# Patient Record
Sex: Male | Born: 1945 | Race: White | Hispanic: No | Marital: Single | Smoking: Former smoker
Health system: Southern US, Community
[De-identification: ages and names within clinical notes are randomized; demographics above are authoritative.]

## PROBLEM LIST (undated history)

## (undated) DIAGNOSIS — N529 Male erectile dysfunction, unspecified: Secondary | ICD-10-CM

## (undated) DIAGNOSIS — G4730 Sleep apnea, unspecified: Secondary | ICD-10-CM

## (undated) DIAGNOSIS — E785 Hyperlipidemia, unspecified: Secondary | ICD-10-CM

## (undated) DIAGNOSIS — R972 Elevated prostate specific antigen [PSA]: Secondary | ICD-10-CM

## (undated) DIAGNOSIS — I509 Heart failure, unspecified: Secondary | ICD-10-CM

## (undated) DIAGNOSIS — J189 Pneumonia, unspecified organism: Secondary | ICD-10-CM

## (undated) DIAGNOSIS — R7303 Prediabetes: Secondary | ICD-10-CM

## (undated) DIAGNOSIS — M1990 Unspecified osteoarthritis, unspecified site: Secondary | ICD-10-CM

## (undated) DIAGNOSIS — N139 Obstructive and reflux uropathy, unspecified: Secondary | ICD-10-CM

## (undated) DIAGNOSIS — F32A Depression, unspecified: Secondary | ICD-10-CM

## (undated) DIAGNOSIS — Z85850 Personal history of malignant neoplasm of thyroid: Secondary | ICD-10-CM

## (undated) DIAGNOSIS — C801 Malignant (primary) neoplasm, unspecified: Secondary | ICD-10-CM

## (undated) DIAGNOSIS — E079 Disorder of thyroid, unspecified: Secondary | ICD-10-CM

## (undated) DIAGNOSIS — R0602 Shortness of breath: Secondary | ICD-10-CM

## (undated) DIAGNOSIS — R0600 Dyspnea, unspecified: Secondary | ICD-10-CM

## (undated) DIAGNOSIS — K219 Gastro-esophageal reflux disease without esophagitis: Secondary | ICD-10-CM

## (undated) DIAGNOSIS — K635 Polyp of colon: Secondary | ICD-10-CM

## (undated) DIAGNOSIS — F419 Anxiety disorder, unspecified: Secondary | ICD-10-CM

## (undated) DIAGNOSIS — R1310 Dysphagia, unspecified: Secondary | ICD-10-CM

## (undated) DIAGNOSIS — K649 Unspecified hemorrhoids: Secondary | ICD-10-CM

## (undated) DIAGNOSIS — E7800 Pure hypercholesterolemia, unspecified: Secondary | ICD-10-CM

## (undated) DIAGNOSIS — E039 Hypothyroidism, unspecified: Secondary | ICD-10-CM

## (undated) DIAGNOSIS — E119 Type 2 diabetes mellitus without complications: Secondary | ICD-10-CM

## (undated) DIAGNOSIS — R011 Cardiac murmur, unspecified: Secondary | ICD-10-CM

## (undated) DIAGNOSIS — I10 Essential (primary) hypertension: Secondary | ICD-10-CM

## (undated) DIAGNOSIS — C61 Malignant neoplasm of prostate: Secondary | ICD-10-CM

## (undated) DIAGNOSIS — F329 Major depressive disorder, single episode, unspecified: Secondary | ICD-10-CM

## (undated) DIAGNOSIS — J449 Chronic obstructive pulmonary disease, unspecified: Secondary | ICD-10-CM

## (undated) DIAGNOSIS — K922 Gastrointestinal hemorrhage, unspecified: Secondary | ICD-10-CM

## (undated) DIAGNOSIS — C73 Malignant neoplasm of thyroid gland: Secondary | ICD-10-CM

## (undated) HISTORY — DX: Prediabetes: R73.03

## (undated) HISTORY — PX: TONSILLECTOMY: SUR1361

## (undated) HISTORY — DX: Shortness of breath: R06.02

## (undated) HISTORY — PX: POSTERIOR LAMINECTOMY / DECOMPRESSION CERVICAL SPINE: SUR739

## (undated) HISTORY — PX: EYE SURGERY: SHX253

## (undated) HISTORY — DX: Elevated prostate specific antigen (PSA): R97.20

## (undated) HISTORY — DX: Pure hypercholesterolemia, unspecified: E78.00

## (undated) HISTORY — PX: TOTAL THYROIDECTOMY: SHX2547

## (undated) HISTORY — DX: Depression, unspecified: F32.A

## (undated) HISTORY — DX: Heart failure, unspecified: I50.9

## (undated) HISTORY — DX: Male erectile dysfunction, unspecified: N52.9

## (undated) HISTORY — PX: COLONOSCOPY: SHX174

## (undated) HISTORY — PX: CERVICAL DISC SURGERY: SHX588

## (undated) HISTORY — PX: PROSTATE BIOPSY: SHX241

## (undated) HISTORY — DX: Malignant neoplasm of thyroid gland: C73

## (undated) HISTORY — DX: Personal history of malignant neoplasm of thyroid: Z85.850

## (undated) HISTORY — PX: OTHER SURGICAL HISTORY: SHX169

## (undated) HISTORY — DX: Essential (primary) hypertension: I10

## (undated) HISTORY — PX: ROTATOR CUFF REPAIR: SHX139

## (undated) HISTORY — DX: Malignant neoplasm of prostate: C61

## (undated) HISTORY — DX: Anxiety disorder, unspecified: F41.9

## (undated) HISTORY — DX: Obstructive and reflux uropathy, unspecified: N13.9

## (undated) HISTORY — DX: Polyp of colon: K63.5

## (undated) HISTORY — PX: LAMINECTOMY FOR EXCISION / EVACUATION INTRASPINAL LESION: SUR741

## (undated) HISTORY — PX: HERNIA REPAIR: SHX51

## (undated) HISTORY — DX: Major depressive disorder, single episode, unspecified: F32.9

## (undated) HISTORY — DX: Disorder of thyroid, unspecified: E07.9

## (undated) HISTORY — PX: BACK SURGERY: SHX140

---

## 2002-07-28 ENCOUNTER — Encounter: Payer: Self-pay | Admitting: Neurosurgery

## 2002-07-29 ENCOUNTER — Encounter: Payer: Self-pay | Admitting: Neurosurgery

## 2002-07-29 ENCOUNTER — Observation Stay (HOSPITAL_COMMUNITY): Admission: RE | Admit: 2002-07-29 | Discharge: 2002-07-29 | Payer: Self-pay | Admitting: Neurosurgery

## 2006-04-03 ENCOUNTER — Ambulatory Visit: Payer: Self-pay | Admitting: Internal Medicine

## 2006-06-05 ENCOUNTER — Ambulatory Visit: Payer: Self-pay | Admitting: Unknown Physician Specialty

## 2007-12-04 ENCOUNTER — Ambulatory Visit: Payer: Self-pay | Admitting: Internal Medicine

## 2009-11-19 ENCOUNTER — Ambulatory Visit: Payer: Self-pay | Admitting: Cardiology

## 2009-12-26 ENCOUNTER — Ambulatory Visit: Payer: Self-pay

## 2010-05-02 ENCOUNTER — Ambulatory Visit: Payer: Self-pay | Admitting: Unknown Physician Specialty

## 2011-02-05 ENCOUNTER — Ambulatory Visit: Payer: Self-pay | Admitting: Otolaryngology

## 2011-10-06 ENCOUNTER — Ambulatory Visit: Payer: Self-pay | Admitting: Unknown Physician Specialty

## 2011-11-03 ENCOUNTER — Ambulatory Visit: Payer: Self-pay | Admitting: Urology

## 2011-12-04 ENCOUNTER — Other Ambulatory Visit: Payer: Self-pay | Admitting: Urology

## 2011-12-05 ENCOUNTER — Encounter (HOSPITAL_COMMUNITY): Payer: Self-pay | Admitting: Pharmacy Technician

## 2011-12-11 ENCOUNTER — Encounter (HOSPITAL_COMMUNITY): Payer: Self-pay

## 2011-12-11 ENCOUNTER — Encounter (HOSPITAL_COMMUNITY)
Admission: RE | Admit: 2011-12-11 | Discharge: 2011-12-11 | Disposition: A | Discharge status: Home / Self Care | Payer: Medicare Other | Source: Ambulatory Visit | Attending: Urology | Admitting: Urology

## 2011-12-11 ENCOUNTER — Ambulatory Visit (HOSPITAL_COMMUNITY)
Admission: RE | Admit: 2011-12-11 | Discharge: 2011-12-11 | Disposition: A | Discharge status: Home / Self Care | Payer: Medicare Other | Source: Ambulatory Visit | Attending: Urology | Admitting: Urology

## 2011-12-11 ENCOUNTER — Other Ambulatory Visit: Payer: Self-pay

## 2011-12-11 DIAGNOSIS — I498 Other specified cardiac arrhythmias: Secondary | ICD-10-CM | POA: Insufficient documentation

## 2011-12-11 DIAGNOSIS — Z01812 Encounter for preprocedural laboratory examination: Secondary | ICD-10-CM | POA: Insufficient documentation

## 2011-12-11 DIAGNOSIS — I452 Bifascicular block: Secondary | ICD-10-CM | POA: Insufficient documentation

## 2011-12-11 DIAGNOSIS — Z01818 Encounter for other preprocedural examination: Secondary | ICD-10-CM | POA: Insufficient documentation

## 2011-12-11 DIAGNOSIS — Z01810 Encounter for preprocedural cardiovascular examination: Secondary | ICD-10-CM | POA: Insufficient documentation

## 2011-12-11 HISTORY — DX: Cardiac murmur, unspecified: R01.1

## 2011-12-11 HISTORY — DX: Malignant (primary) neoplasm, unspecified: C80.1

## 2011-12-11 HISTORY — DX: Hypothyroidism, unspecified: E03.9

## 2011-12-11 HISTORY — DX: Essential (primary) hypertension: I10

## 2011-12-11 HISTORY — DX: Gastrointestinal hemorrhage, unspecified: K92.2

## 2011-12-11 HISTORY — DX: Sleep apnea, unspecified: G47.30

## 2011-12-11 LAB — CBC
HCT: 41.7 % (ref 39.0–52.0)
MCH: 29.6 pg (ref 26.0–34.0)
MCHC: 34.1 g/dL (ref 30.0–36.0)
MCV: 87.1 fL (ref 78.0–100.0)
Platelets: 229 10*3/uL (ref 150–400)
RDW: 12.8 % (ref 11.5–15.5)
WBC: 9.8 10*3/uL (ref 4.0–10.5)

## 2011-12-11 LAB — ABO/RH: ABO/RH(D): A POS

## 2011-12-11 LAB — BASIC METABOLIC PANEL
BUN: 12 mg/dL (ref 6–23)
Calcium: 9.2 mg/dL (ref 8.4–10.5)
Chloride: 103 mEq/L (ref 96–112)
Creatinine, Ser: 0.94 mg/dL (ref 0.50–1.35)
GFR calc Af Amer: >90 mL/min (ref >90)

## 2011-12-11 LAB — SURGICAL PCR SCREEN
MRSA, PCR: NEGATIVE
Staphylococcus aureus: NEGATIVE

## 2011-12-11 MED ORDER — CEFAZOLIN SODIUM 1-5 GM-% IV SOLN: 1.0000 g | INTRAVENOUS | Status: DC

## 2011-12-11 NOTE — Patient Instructions (Signed)
20 [name] [name]  12/11/2011   Your procedure is scheduled on:  12/15/11 1100am-200pm  Report to [facility] at 0900 AM.  Call this number if you have problems the morning of surgery: [phone]   Remember:   Do not eat food:After Midnight.  May have clear liquids:until Midnight .  Clear liquids include soda, tea, black coffee, apple or grape juice, broth.  Take these medicines the morning of surgery with A SIP OF WATER:    Do not wear jewelry,   Do not wear lotions, powders, or perfumes.    Do not bring valuables to the hospital.  Contacts, dentures or bridgework may not be worn into surgery.  Leave suitcase in the car. After surgery it may be brought to your room.  For patients admitted to the hospital, checkout time is 11:00 AM the day of discharge.     Special Instructions: CHG Shower Use Special Wash: 1/2 bottle night before surgery and 1/2 bottle morning of surgery. shower chin to toes with CHG.  Wash face and private parts with regular soap.     Please read over the following fact sheets that you were given: MRSA Information, Incentive Spirometry Fact Sheet , Blood Transfusion Fact Sheet, coughing and deep breathing exercises, leg exercises.

## 2011-12-13 NOTE — H&P (Signed)
Chief Complaint  Prostate Cancer   Reason For Visit  Reason for consult: To consider a robotic prostatectomy for treatment of prostate cancer. Physician requesting consult: Dr. Assunta Gambles PCP: Dr. Aram Beecham   History of Present Illness  [name] [name] is a 66 year old who was found to have an elevated PSA of 4.5 which prompted an evaluation and prostate biopsy by Dr. Achilles Dunk.  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 He underwent an MRI by Dr. Achilles Dunk which confirmed suspicion of bilateral malignancy but did not demonstrate evidence for EPE, SVI, or LAD. He has no family history of prostate cancer.  TNM stage: cT1c N0 Mx PSA: 4.5  Gleason score: 3+4=7 Biopsy (10/22/11 - [facility] read by Dr. Audry Riles, Acc # [phone]): 5/12 cores    Left: L lateral mid (25%, 3+4=7), L mid (58%, 3+4=7), L base (34%, 3+4=7)   Right: R lateral apex (3%, 3+4=7), R lateral mid (12%, 3+4=7) Prostate volume: 55.8 cc  Nomogram: OC disease: 80% EPE: 14% SVI: 1% LNI: 2.2% PFS (surgery): 94%, 91%  Urinary function: He has moderate symptoms including urinary frequency, intermittency, and a weak stream. IPSS: 16/5.  Erectile function: He is not sexually active. Erectile function is a low priority to him. SHIM score: 6.   Past Medical History Problems  1. History of  Adult Sleep Apnea 780.57 2. History of  Functional Murmur 785.2 3. History of  Hypercholesterolemia 272.0 4. History of  Hypertension 401.9  Surgical History Problems  1. History of  Back Surgery 2. History of  Neck Surgery 3. History of  Shoulder Arthroscopy With Rotator Cuff Repair Right 4. History of  Thyroid Surgery Total Thyroidectomy 5. History of  Umbilical Hernia Repair  Current Meds 1. Aspirin 81 MG Oral Tablet; Therapy: (Recorded:15Jan2013) to 2. FLUoxetine HCl 20 MG Oral Capsule; Therapy: (Recorded:15Jan2013) to 3. Levothyroxine Sodium 200 MCG  Oral Tablet; Therapy: (Recorded:15Jan2013) to 4. Losartan Potassium-HCTZ 50-12.5 MG Oral Tablet; Therapy: (Recorded:15Jan2013) to 5. Lunesta 2 MG Oral Tablet; Therapy: (Recorded:15Jan2013) to 6. Simvastatin 40 MG Oral Tablet; Therapy: (Recorded:15Jan2013) to 7. Valium 5 MG Oral Tablet; Therapy: (Recorded:15Jan2013) to 8. Verapamil HCl ER 180 MG Oral Tablet Extended Release; Therapy: (Recorded:15Jan2013) to  Allergies Medication  1. No Known Drug Allergies  Family History Denied  1. 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  Musculoskeletal: back pain.    Vitals Vital Signs [Data Includes: Last 1 Day]  15Jan2013 01:53PM  BMI Calculated: 32.08 BSA Calculated: 2.39 Height: 6 ft 2 in Weight: 250 lb  Blood Pressure: 136 / 70 Heart Rate: 72  Physical Exam Constitutional: Well nourished and well developed . No acute distress.  ENT:. The ears and nose are normal in appearance.  Neck: The appearance of the neck is normal and no neck mass is present.  Pulmonary: No respiratory distress, normal respiratory rhythm and effort and clear bilateral breath sounds.  Cardiovascular: Heart rate and rhythm are normal . No peripheral edema.  Abdomen: The abdomen is mildly  obese. The abdomen is soft and nontender. No masses are palpated. No CVA tenderness. No hernias are palpable. No hepatosplenomegaly noted.  Rectal: Rectal exam demonstrates normal sphincter tone, no tenderness and no masses. Prostate size is estimated to be 45  g. He does have induration at the left mid and apical portion of the prostate. No discrete nodularity. There is no evidence of extraprostatic extension. The prostate is not tender. The left seminal vesicle is nonpalpable. The right seminal vesicle is nonpalpable. The perineum is normal on inspection.  Lymphatics: The femoral and inguinal nodes are not enlarged or tender.  Skin: Normal skin turgor, no visible rash and no visible skin lesions.  Neuro/Psych:. Mood and affect are appropriate.    Results/Data Urine [Data Includes: Last 1 Day]   15Jan2013 COLOR AMBER  APPEARANCE CLEAR  SPECIFIC GRAVITY 1.025  pH 5.5  GLUCOSE NEG mg/dL BILIRUBIN SMALL  KETONE TRACE mg/dL BLOOD NEG  PROTEIN NEG mg/dL UROBILINOGEN 1 mg/dL NITRITE NEG  LEUKOCYTE ESTERASE NEG    I have reviewed his medical records PSA results, and pathology report. I've also reviewed his MRI report. Findings are as dictated above.   Assessment Assessed  1. Prostate Cancer 185  Plan Health Maintenance (V70.0)  1. UA With REFLEX  Done: 15Jan2013 01:42PM Prostate Cancer (185)  2. Follow-up Schedule Surgery Office  Follow-up  Done: 15Jan2013  Discussion/Summary  1. Prostate cancer:   The patient was counseled about the natural history of prostate cancer and the standard treatment options that are available for prostate cancer. It was explained to him how his age and life expectancy, clinical stage, Gleason score, and PSA affect his prognosis, the decision to proceed with additional staging studies, as well as how that information influences recommended treatment strategies. We discussed the roles for active surveillance, radiation therapy, surgical therapy, androgen deprivation, as well as ablative therapy options for the treatment of prostate cancer as appropriate to his individual cancer situation. We discussed the risks and benefits of these options with regard to their impact on cancer control and also in terms of potential  adverse events, complications, and impact on quiality of life particularly related to urinary, bowel, and sexual function. The patient was encouraged to ask questions throughout the discussion today and all questions were answered to his stated satisfaction. In addition, the patient was provided with and/or directed to appropriate resources and literature for further education about prostate cancer and treatment options.   We discussed surgical therapy for prostate cancer including the different available surgical approaches. We discussed, in detail, the risks and expectations of surgery with regard to cancer control, urinary control, and erectile function as well as the expected postoperative recovery process. Additional risks of surgery including but not limited to bleeding, infection, hernia formation, nerve damage, lymphocele formation, bowel/rectal injury potentially necessitating colostomy, damage to the urinary tract resulting in urine leakage, urethral stricture, and the cardiopulmonary risks such as myocardial infarction, stroke, death, venothromboembolism, etc. were explained. The risk of open surgical conversion for robotic/laparoscopic prostatectomy was also discussed.      He does wish to proceed with surgical therapy after reviewing treatment options in detail. He will be scheduled for a robotic prostatectomy and pelvic lymphadenectomy. Considering that he does not have concern about future erectile function, I will plan to perform a somewhat wider dissection of the left based on his digital rectal exam findings.  Cc: Dr. Assunta Gambles Dr. Aram Beecham    SignaturesElectronically signed by : Heloise Purpura, M.D.; Dec 02 2011  6:12PM

## 2011-12-15 ENCOUNTER — Encounter (HOSPITAL_COMMUNITY)
Admission: RE | Disposition: A | Discharge status: Home / Self Care | Payer: Self-pay | Source: Ambulatory Visit | Attending: Urology

## 2011-12-15 ENCOUNTER — Encounter (HOSPITAL_COMMUNITY): Payer: Self-pay | Admitting: Anesthesiology

## 2011-12-15 ENCOUNTER — Other Ambulatory Visit: Payer: Self-pay | Admitting: Urology

## 2011-12-15 ENCOUNTER — Inpatient Hospital Stay (HOSPITAL_COMMUNITY): Payer: Medicare Other | Admitting: Anesthesiology

## 2011-12-15 ENCOUNTER — Inpatient Hospital Stay (HOSPITAL_COMMUNITY)
Admission: RE | Admit: 2011-12-15 | Discharge: 2011-12-17 | DRG: 708 | Disposition: A | Discharge status: Home / Self Care | Payer: Medicare Other | Source: Ambulatory Visit | Attending: Urology | Admitting: Urology

## 2011-12-15 ENCOUNTER — Encounter (HOSPITAL_COMMUNITY): Payer: Self-pay

## 2011-12-15 DIAGNOSIS — C61 Malignant neoplasm of prostate: Principal | ICD-10-CM | POA: Diagnosis present

## 2011-12-15 DIAGNOSIS — Z7982 Long term (current) use of aspirin: Secondary | ICD-10-CM

## 2011-12-15 DIAGNOSIS — R350 Frequency of micturition: Secondary | ICD-10-CM | POA: Diagnosis present

## 2011-12-15 DIAGNOSIS — R011 Cardiac murmur, unspecified: Secondary | ICD-10-CM | POA: Diagnosis present

## 2011-12-15 DIAGNOSIS — F17200 Nicotine dependence, unspecified, uncomplicated: Secondary | ICD-10-CM | POA: Diagnosis present

## 2011-12-15 DIAGNOSIS — R3913 Splitting of urinary stream: Secondary | ICD-10-CM | POA: Diagnosis present

## 2011-12-15 DIAGNOSIS — E7800 Pure hypercholesterolemia, unspecified: Secondary | ICD-10-CM | POA: Diagnosis present

## 2011-12-15 DIAGNOSIS — Z79899 Other long term (current) drug therapy: Secondary | ICD-10-CM

## 2011-12-15 DIAGNOSIS — R319 Hematuria, unspecified: Secondary | ICD-10-CM | POA: Diagnosis not present

## 2011-12-15 DIAGNOSIS — G4730 Sleep apnea, unspecified: Secondary | ICD-10-CM | POA: Diagnosis present

## 2011-12-15 DIAGNOSIS — I10 Essential (primary) hypertension: Secondary | ICD-10-CM | POA: Diagnosis present

## 2011-12-15 HISTORY — PX: ROBOT ASSISTED LAPAROSCOPIC RADICAL PROSTATECTOMY: SHX5141

## 2011-12-15 LAB — HEMOGLOBIN AND HEMATOCRIT, BLOOD
HCT: 43.2 % (ref 39.0–52.0)
HCT: 42.5 % (ref 39.0–52.0)
Hemoglobin: 14.6 g/dL (ref 13.0–17.0)
Hemoglobin: 14.6 g/dL (ref 13.0–17.0)

## 2011-12-15 LAB — TYPE AND SCREEN: ABO/RH(D): A POS

## 2011-12-15 SURGERY — ROBOTIC ASSISTED LAPAROSCOPIC RADICAL PROSTATECTOMY LEVEL 2
Anesthesia: General | Site: Pelvis | Wound class: Clean Contaminated

## 2011-12-15 MED ORDER — KETOROLAC TROMETHAMINE 15 MG/ML IJ SOLN
15.0000 mg | Freq: Four times a day (QID) | INTRAMUSCULAR | Status: AC
  Administered 2011-12-15 – 2011-12-16 (×6): 15 mg via INTRAVENOUS
  Filled 2011-12-15 (×5): qty 1

## 2011-12-15 MED ORDER — HYDRALAZINE HCL 20 MG/ML IJ SOLN
INTRAMUSCULAR | Status: DC | PRN
  Administered 2011-12-15 (×3): 5 mg via INTRAVENOUS

## 2011-12-15 MED ORDER — HYDROCODONE-ACETAMINOPHEN 5-325 MG PO TABS: 1.0000 | ORAL_TABLET | Freq: Four times a day (QID) | ORAL | Status: DC | PRN

## 2011-12-15 MED ORDER — SODIUM CHLORIDE 0.9 % IV BOLUS (SEPSIS)
1000.0000 mL | Freq: Once | INTRAVENOUS | Status: AC
  Administered 2011-12-15: 1000 mL via INTRAVENOUS

## 2011-12-15 MED ORDER — CIPROFLOXACIN HCL 500 MG PO TABS: 500.0000 mg | ORAL_TABLET | Freq: Two times a day (BID) | ORAL | Status: AC

## 2011-12-15 MED ORDER — VERAPAMIL HCL ER 180 MG PO TBCR
180.0000 mg | EXTENDED_RELEASE_TABLET | Freq: Every day | ORAL | Status: DC
  Administered 2011-12-16 – 2011-12-17 (×2): 180 mg via ORAL
  Filled 2011-12-15 (×2): qty 1

## 2011-12-15 MED ORDER — STERILE WATER FOR IRRIGATION IR SOLN
Status: DC | PRN
  Administered 2011-12-15: 3000 mL

## 2011-12-15 MED ORDER — KCL IN DEXTROSE-NACL 20-5-0.45 MEQ/L-%-% IV SOLN
INTRAVENOUS | Status: DC
  Administered 2011-12-15 – 2011-12-16 (×3): via INTRAVENOUS
  Filled 2011-12-15 (×8): qty 1000

## 2011-12-15 MED ORDER — HYDROMORPHONE HCL PF 1 MG/ML IJ SOLN
0.2500 mg | INTRAMUSCULAR | Status: DC | PRN
  Administered 2011-12-15 (×4): 0.5 mg via INTRAVENOUS

## 2011-12-15 MED ORDER — CEFAZOLIN SODIUM-DEXTROSE 2-3 GM-% IV SOLR
2.0000 g | Freq: Once | INTRAVENOUS | Status: AC
  Administered 2011-12-15: 2 g via INTRAVENOUS

## 2011-12-15 MED ORDER — DIPHENHYDRAMINE HCL 50 MG/ML IJ SOLN: 12.5000 mg | Freq: Four times a day (QID) | INTRAMUSCULAR | Status: DC | PRN

## 2011-12-15 MED ORDER — LACTATED RINGERS IV SOLN
INTRAVENOUS | Status: DC | PRN
  Administered 2011-12-15 (×3): via INTRAVENOUS

## 2011-12-15 MED ORDER — SUCCINYLCHOLINE CHLORIDE 20 MG/ML IJ SOLN
INTRAMUSCULAR | Status: DC | PRN
  Administered 2011-12-15: 100 mg via INTRAVENOUS

## 2011-12-15 MED ORDER — ROCURONIUM BROMIDE 100 MG/10ML IV SOLN
INTRAVENOUS | Status: DC | PRN
  Administered 2011-12-15: 50 mg via INTRAVENOUS
  Administered 2011-12-15: 10 mg via INTRAVENOUS
  Administered 2011-12-15: 20 mg via INTRAVENOUS

## 2011-12-15 MED ORDER — NEOSTIGMINE METHYLSULFATE 1 MG/ML IJ SOLN
INTRAMUSCULAR | Status: DC | PRN
  Administered 2011-12-15: 5 mg via INTRAVENOUS

## 2011-12-15 MED ORDER — ACETAMINOPHEN 325 MG PO TABS
650.0000 mg | ORAL_TABLET | ORAL | Status: DC | PRN
Start: 2011-12-15 — End: 2011-12-17
  Administered 2011-12-16: 650 mg via ORAL
  Filled 2011-12-15: qty 2

## 2011-12-15 MED ORDER — DOCUSATE SODIUM 100 MG PO CAPS
100.0000 mg | ORAL_CAPSULE | Freq: Two times a day (BID) | ORAL | Status: DC
  Administered 2011-12-15 – 2011-12-17 (×3): 100 mg via ORAL
  Filled 2011-12-15 (×5): qty 1

## 2011-12-15 MED ORDER — DIPHENHYDRAMINE HCL 12.5 MG/5ML PO ELIX: 12.5000 mg | ORAL_SOLUTION | Freq: Four times a day (QID) | ORAL | Status: DC | PRN

## 2011-12-15 MED ORDER — ONDANSETRON HCL 4 MG/2ML IJ SOLN
INTRAMUSCULAR | Status: DC | PRN
  Administered 2011-12-15: 4 mg via INTRAVENOUS

## 2011-12-15 MED ORDER — MENTHOL 3 MG MT LOZG
1.0000 | LOZENGE | OROMUCOSAL | Status: DC | PRN
  Administered 2011-12-15: 3 mg via ORAL
  Filled 2011-12-15: qty 9

## 2011-12-15 MED ORDER — GLYCOPYRROLATE 0.2 MG/ML IJ SOLN
INTRAMUSCULAR | Status: DC | PRN
  Administered 2011-12-15: .6 mg via INTRAVENOUS

## 2011-12-15 MED ORDER — SIMVASTATIN 40 MG PO TABS: 40.0000 mg | ORAL_TABLET | Freq: Every day | ORAL | Status: DC

## 2011-12-15 MED ORDER — DIAZEPAM 5 MG PO TABS: 5.0000 mg | ORAL_TABLET | Freq: Four times a day (QID) | ORAL | Status: DC | PRN

## 2011-12-15 MED ORDER — ACETAMINOPHEN 10 MG/ML IV SOLN
INTRAVENOUS | Status: DC | PRN
  Administered 2011-12-15: 1000 mg via INTRAVENOUS

## 2011-12-15 MED ORDER — MORPHINE SULFATE 2 MG/ML IJ SOLN
2.0000 mg | INTRAMUSCULAR | Status: DC | PRN
  Administered 2011-12-15 – 2011-12-16 (×3): 2 mg via INTRAVENOUS
  Filled 2011-12-15 (×3): qty 1

## 2011-12-15 MED ORDER — CEFAZOLIN SODIUM 1-5 GM-% IV SOLN
1.0000 g | Freq: Three times a day (TID) | INTRAVENOUS | Status: AC
  Administered 2011-12-15 – 2011-12-16 (×2): 1 g via INTRAVENOUS
  Filled 2011-12-15 (×2): qty 50

## 2011-12-15 MED ORDER — ROSUVASTATIN CALCIUM 10 MG PO TABS
10.0000 mg | ORAL_TABLET | Freq: Every day | ORAL | Status: DC
  Administered 2011-12-16 – 2011-12-17 (×2): 10 mg via ORAL
  Filled 2011-12-15 (×2): qty 1

## 2011-12-15 MED ORDER — ONDANSETRON HCL 4 MG/2ML IJ SOLN: 4.0000 mg | INTRAMUSCULAR | Status: DC | PRN

## 2011-12-15 MED ORDER — BUPIVACAINE-EPINEPHRINE 0.25% -1:200000 IJ SOLN
INTRAMUSCULAR | Status: DC | PRN
  Administered 2011-12-15: 27 mL

## 2011-12-15 MED ORDER — ZOLPIDEM TARTRATE 5 MG PO TABS
5.0000 mg | ORAL_TABLET | Freq: Every evening | ORAL | Status: DC | PRN
  Administered 2011-12-15 – 2011-12-16 (×2): 5 mg via ORAL
  Filled 2011-12-15 (×2): qty 1

## 2011-12-15 MED ORDER — ACETAMINOPHEN 325 MG PO TABS: 650.0000 mg | ORAL_TABLET | ORAL | Status: DC | PRN

## 2011-12-15 MED ORDER — HYDROMORPHONE HCL PF 1 MG/ML IJ SOLN
INTRAMUSCULAR | Status: DC | PRN
  Administered 2011-12-15: 1 mg via INTRAVENOUS

## 2011-12-15 MED ORDER — PROMETHAZINE HCL 25 MG/ML IJ SOLN: 6.2500 mg | INTRAMUSCULAR | Status: DC | PRN

## 2011-12-15 MED ORDER — DEXAMETHASONE SODIUM PHOSPHATE 10 MG/ML IJ SOLN
INTRAMUSCULAR | Status: DC | PRN
  Administered 2011-12-15: 10 mg via INTRAVENOUS

## 2011-12-15 MED ORDER — INDIGOTINDISULFONATE SODIUM 8 MG/ML IJ SOLN
INTRAMUSCULAR | Status: DC | PRN
  Administered 2011-12-15 (×2): 5 mL via INTRAVENOUS

## 2011-12-15 MED ORDER — LACTATED RINGERS IV SOLN
INTRAVENOUS | Status: DC | PRN
  Administered 2011-12-15: 11:00:00

## 2011-12-15 MED ORDER — FENTANYL CITRATE 0.05 MG/ML IJ SOLN
INTRAMUSCULAR | Status: DC | PRN
  Administered 2011-12-15: 100 ug via INTRAVENOUS
  Administered 2011-12-15 (×3): 50 ug via INTRAVENOUS

## 2011-12-15 MED ORDER — SODIUM CHLORIDE 0.9 % IR SOLN
Status: DC | PRN
  Administered 2011-12-15: 1000 mL

## 2011-12-15 MED ORDER — LEVOTHYROXINE SODIUM 200 MCG PO TABS
200.0000 ug | ORAL_TABLET | Freq: Every day | ORAL | Status: DC
  Administered 2011-12-16 – 2011-12-17 (×2): 200 ug via ORAL
  Filled 2011-12-15 (×2): qty 1

## 2011-12-15 MED ORDER — FLUOXETINE HCL 20 MG PO CAPS
20.0000 mg | ORAL_CAPSULE | Freq: Every day | ORAL | Status: DC
  Administered 2011-12-16 – 2011-12-17 (×2): 20 mg via ORAL
  Filled 2011-12-15 (×2): qty 1

## 2011-12-15 MED ORDER — PHENOL 1.4 % MT LIQD
1.0000 | OROMUCOSAL | Status: DC | PRN
  Filled 2011-12-15: qty 177

## 2011-12-15 MED ORDER — PROPOFOL 10 MG/ML IV EMUL
INTRAVENOUS | Status: DC | PRN
  Administered 2011-12-15: 200 mg via INTRAVENOUS

## 2011-12-15 MED ORDER — LIDOCAINE HCL (CARDIAC) 20 MG/ML IV SOLN
INTRAVENOUS | Status: DC | PRN
  Administered 2011-12-15: 60 mg via INTRAVENOUS

## 2011-12-15 MED ORDER — MIDAZOLAM HCL 5 MG/5ML IJ SOLN
INTRAMUSCULAR | Status: DC | PRN
  Administered 2011-12-15: 2 mg via INTRAVENOUS

## 2011-12-15 SURGICAL SUPPLY — 34 items
CANISTER SUCTION 2500CC (MISCELLANEOUS) ×3 IMPLANT
CATH ROBINSON RED A/P 8FR (CATHETERS) ×3 IMPLANT
CHLORAPREP W/TINT 26ML (MISCELLANEOUS) ×3 IMPLANT
CLIP LIGATING HEM O LOK PURPLE (MISCELLANEOUS) ×6 IMPLANT
CLOTH BEACON ORANGE TIMEOUT ST (SAFETY) ×3 IMPLANT
CORD HIGH FREQUENCY UNIPOLAR (ELECTROSURGICAL) ×3 IMPLANT
COVER SURGICAL LIGHT HANDLE (MISCELLANEOUS) ×3 IMPLANT
COVER TIP SHEARS 8 DVNC (MISCELLANEOUS) ×2 IMPLANT
COVER TIP SHEARS 8MM DA VINCI (MISCELLANEOUS) ×1
CUTTER ECHEON FLEX ENDO 45 340 (ENDOMECHANICALS) ×3 IMPLANT
DECANTER SPIKE VIAL GLASS SM (MISCELLANEOUS) IMPLANT
DRAPE SURG IRRIG POUCH 19X23 (DRAPES) ×3 IMPLANT
DRSG TEGADERM 6X8 (GAUZE/BANDAGES/DRESSINGS) ×6 IMPLANT
ELECT REM PT RETURN 9FT ADLT (ELECTROSURGICAL) ×3
ELECTRODE REM PT RTRN 9FT ADLT (ELECTROSURGICAL) ×2 IMPLANT
GLOVE BIO SURGEON STRL SZ 6.5 (GLOVE) ×3 IMPLANT
GLOVE BIOGEL M STRL SZ7.5 (GLOVE) ×6 IMPLANT
GOWN STRL NON-REIN LRG LVL3 (GOWN DISPOSABLE) ×9 IMPLANT
GOWN STRL REIN XL XLG (GOWN DISPOSABLE) ×6 IMPLANT
HOLDER FOLEY CATH W/STRAP (MISCELLANEOUS) ×3 IMPLANT
IV LACTATED RINGERS 1000ML (IV SOLUTION) ×3 IMPLANT
KIT ACCESSORY DA VINCI DISP (KITS) ×1
KIT ACCESSORY DVNC DISP (KITS) ×2 IMPLANT
NDL SAFETY ECLIPSE 18X1.5 (NEEDLE) ×2 IMPLANT
NEEDLE HYPO 18GX1.5 SHARP (NEEDLE) ×2
PACK ROBOT UROLOGY CUSTOM (CUSTOM PROCEDURE TRAY) ×3 IMPLANT
RELOAD GREEN ECHELON 45 (STAPLE) ×3 IMPLANT
SET TUBE IRRIG SUCTION NO TIP (IRRIGATION / IRRIGATOR) ×3 IMPLANT
SOLUTION ELECTROLUBE (MISCELLANEOUS) ×3 IMPLANT
SPONGE GAUZE 4X4 12PLY (GAUZE/BANDAGES/DRESSINGS) IMPLANT
SUT VICRYL 0 UR6 27IN ABS (SUTURE) ×6 IMPLANT
SYR 27GX1/2 1ML LL SAFETY (SYRINGE) ×3 IMPLANT
TOWEL OR NON WOVEN STRL DISP B (DISPOSABLE) ×3 IMPLANT
WATER STERILE IRR 1500ML POUR (IV SOLUTION) ×6 IMPLANT

## 2011-12-15 NOTE — Interval H&P Note (Signed)
History and Physical Interval Note:  12/15/2011 9:43 AM  [name] [name]  has presented today for surgery, with the diagnosis of Prostate Cancer  The various methods of treatment have been discussed with the patient and family. After consideration of risks, benefits and other options for treatment, the patient has consented to  Procedure(s): ROBOTIC ASSISTED LAPAROSCOPIC RADICAL PROSTATECTOMY LEVEL 2 as a surgical intervention .  The patients' history has been reviewed, patient examined, no change in status, stable for surgery.  I have reviewed the patients' chart and labs.  Questions were answered to the patient's satisfaction.     Husna Krone,LES

## 2011-12-15 NOTE — Op Note (Signed)
Preoperative diagnosis: Clinically localized adenocarcinoma of the prostate (clinical stage cT1c Nx Mx)  Postoperative diagnosis: Clinically localized adenocarcinoma of the prostate (clinical stage cT1c Nx Mx)  Procedure:  1. Robotic assisted laparoscopic radical prostatectomy 2. Bilateral robotic assisted laparoscopic pelvic lymphadenectomy  Surgeon: Moody Bruins. M.D.  Assistant: Pecola Leisure, PA-C  Anesthesia: General  Complications: None  EBL: 100 mL  IVF:  2100 mL crystalloid  Specimens: 1. Prostate and seminal vesicles 2. Right pelvic lymph nodes 3. Left pelvic lymph nodes  Disposition of specimens: Pathology  Drains: 1. 20 Fr coude catheter 2. # 19 Blake pelvic drain  Indication: [name] [name] is a 66 y.o. year old patient with clinically localized prostate cancer.  After a thorough review of the management options for treatment of prostate cancer, he elected to proceed with surgical therapy and the above procedure(s).  We have discussed the potential benefits and risks of the procedure, side effects of the proposed treatment, the likelihood of the patient achieving the goals of the procedure, and any potential problems that might occur during the procedure or recuperation. Informed consent has been obtained.  Description of procedure:  The patient was taken to the operating room and a general anesthetic was administered. He was given preoperative antibiotics, placed in the dorsal lithotomy position, and prepped and draped in the usual sterile fashion. Next a preoperative timeout was performed. A urethral catheter was placed into the bladder and a site was selected near the umbilicus for placement of the camera port. This was placed using a standard open Hassan technique which allowed entry into the peritoneal cavity under direct vision and without difficulty. A 12 mm port was placed and a pneumoperitoneum established. The camera was then used to inspect the  abdomen and there was no evidence of any intra-abdominal injuries or other abnormalities. The remaining abdominal ports were then placed. 8 mm robotic ports were placed in the right lower quadrant, left lower quadrant, and far left lateral abdominal wall. A 5 mm port was placed in the right upper quadrant and a 12 mm port was placed in the right lateral abdominal wall for laparoscopic assistance. All ports were placed under direct vision without difficulty. The surgical cart was then docked.   Utilizing the cautery scissors, the bladder was reflected posteriorly allowing entry into the space of Retzius and identification of the endopelvic fascia and prostate. The periprostatic fat was then removed from the prostate allowing full exposure of the endopelvic fascia. The endopelvic fascia was then incised from the apex back to the base of the prostate bilaterally and the underlying levator muscle fibers were swept laterally off the prostate thereby isolating the dorsal venous complex. The dorsal vein was then stapled and divided with a 45 mm Flex Echelon stapler. Attention then turned to the bladder neck which was divided anteriorly thereby allowing entry into the bladder and exposure of the urethral catheter. The catheter balloon was deflated and the catheter was brought into the operative field and used to retract the prostate anteriorly. The posterior bladder neck was then examined and was divided allowing further dissection between the bladder and prostate posteriorly until the vasa deferentia and seminal vessels were identified. The vasa deferentia were isolated, divided, and lifted anteriorly. The seminal vesicles were dissected down to their tips with care to control the seminal vascular arterial blood supply. These structures were then lifted anteriorly and the space between Denonvillier's fascia and the anterior rectum was developed with a combination of sharp and  blunt dissection. This isolated the vascular  pedicles of the prostate.  The vascular pedicles of the prostate were then ligated with Weck clips and divided with sharp cold scissor dissection. While no aggressive attempt was made to preserve the neurovascular bundles (considering the patient's preoperative function and the fact he placed erectile function preservation as a low priority), some of the neurovascular bundle tissue was preserved.    The urethra was then sharply transected allowing the prostate specimen to be disarticulated. The pelvis was copiously irrigated and hemostasis was ensured. There was no evidence for rectal injury.  Attention then turned to the right pelvic sidewall. The fibrofatty tissue between the external iliac vein, confluence of the iliac vessels, hypogastric artery, and Cooper's ligament was dissected free from the pelvic sidewall with care to preserve the obturator nerve. Weck clips were used for lymphostasis and hemostasis. An identical procedure was performed on the contralateral side and the lymphatic packets were removed for permanent pathologic analysis.  Attention then turned to the urethral anastomosis. A 2-0 Vicryl slip knot was placed between Denonvillier's fascia, the posterior bladder neck, and the posterior urethra to reapproximate these structures. A double-armed 3-0 Monocryl suture was then used to perform a 360 running tension-free anastomosis between the bladder neck and urethra. A new urethral catheter was then placed into the bladder and irrigated. There were no blood clots within the bladder and the anastomosis appeared to be watertight. A #19 Blake drain was then brought through the left lateral 8 mm port site and positioned appropriately within the pelvis. It was secured to the skin with a nylon suture. The surgical cart was then undocked. The right lateral 12 mm port site was closed at the fascial level with a 0 Vicryl suture placed laparoscopically. All remaining ports were then removed under direct  vision. The prostate specimen was removed intact within the Endopouch retrieval bag via the periumbilical camera port site. This fascial opening was closed with two running 0 Vicryl sutures. 0.25% Marcaine was then injected into all port sites and all incisions were reapproximated at the skin level with staples. Sterile dressings were applied. The patient appeared to tolerate the procedure well and without complications. The patient was able to be extubated and transferred to the recovery unit in satisfactory condition.   Moody Bruins MD

## 2011-12-15 NOTE — Progress Notes (Signed)
Patient ID: [name] [name], male   DOB: 02/11/1946, 66 y.o.   MRN: [id number]  Post-op note  Subjective: Pt noted to have hematuria.  I had irrigated him in the PACU 2 hrs ago and a few clots had been removed but his urine cleared quickly.  Objective: Vital signs in last 24 hours: Temp:  [96.7 F (35.9 C)-97.8 F (36.6 C)] 97.5 F (36.4 C) (01/28 1606) Pulse Rate:  [64-82] 78  (01/28 1606) Resp:  [11-18] 16  (01/28 1606) BP: (157-180)/(75-97) 167/80 mmHg (01/28 1606) SpO2:  [96 %-100 %] 96 % (01/28 1606)  Intake/Output from previous day:   Intake/Output this shift: Total I/O In: 4255 [P.O.:480; I.V.:3000; Other:775] Out: 900 [Urine:800; Blood:100]  Physical Exam:  General: Alert and oriented. Abdomen: Soft, Nondistended. Incisions: Clean and dry. GU: Urine is grossly bloody with clots in tubing  Lab Results:  Basename 12/15/11 1402  HGB 14.6  HCT 43.2   I irrigated catheter again with stringy clots removed.  Again urine cleared quickly.  Assessment/Plan: POD#0   1) Irrigate catheter prn 2) Catheter placed on traction and an additional 10 cc placed into catheter balloon 3) Check H/H tonight   Rolly Salter, Montez Hageman. MD   LOS: 0 days   Shanell Aden,LES 12/15/2011, 5:40 PM

## 2011-12-15 NOTE — Progress Notes (Signed)
Pt brought his own CPAP from home. Requests using it. RN called service response to come check CPAP machine. RT instructed pt and RN to call if there were any questions or problems.

## 2011-12-15 NOTE — Anesthesia Preprocedure Evaluation (Addendum)
Anesthesia Evaluation  Patient identified by MRN, date of birth, ID band Patient awake    Reviewed: Allergy & Precautions, H&P , NPO status , Patient's Chart, lab work & pertinent test results  Airway Mallampati: II TM Distance: >3 FB Neck ROM: Full    Dental No notable dental hx.    Pulmonary sleep apnea , Current Smoker,  clear to auscultation  Pulmonary exam normal       Cardiovascular hypertension, Regular Normal    Neuro/Psych Negative Neurological ROS  Negative Psych ROS   GI/Hepatic negative GI ROS, Neg liver ROS,   Endo/Other  Negative Endocrine ROSHypothyroidism   Renal/GU negative Renal ROS  Genitourinary negative   Musculoskeletal negative musculoskeletal ROS (+)   Abdominal   Peds negative pediatric ROS (+)  Hematology negative hematology ROS (+)   Anesthesia Other Findings   Reproductive/Obstetrics negative OB ROS                           Anesthesia Physical Anesthesia Plan  ASA: III  Anesthesia Plan: General   Post-op Pain Management:    Induction: Intravenous  Airway Management Planned: Oral ETT  Additional Equipment:   Intra-op Plan:   Post-operative Plan: Extubation in OR  Informed Consent: I have reviewed the patients History and Physical, chart, labs and discussed the procedure including the risks, benefits and alternatives for the proposed anesthesia with the patient or authorized representative who has indicated his/her understanding and acceptance.   Dental advisory given  Plan Discussed with: CRNA  Anesthesia Plan Comments:         Anesthesia Quick Evaluation

## 2011-12-15 NOTE — Transfer of Care (Signed)
Immediate Anesthesia Transfer of Care Note  Patient: [name] [name]  Procedure(s) Performed:  ROBOTIC ASSISTED LAPAROSCOPIC RADICAL PROSTATECTOMY LEVEL 2 - BILATERAL PELVIC LYMPHADENECTOMY      Patient Location: PACU  Anesthesia Type: General  Level of Consciousness: awake and sedated  Airway & Oxygen Therapy: Patient Spontanous Breathing and Patient connected to face mask oxygen  Post-op Assessment: Report given to PACU RN and Post -op Vital signs reviewed and stable  Post vital signs: Reviewed and stable  Complications: No apparent anesthesia complications

## 2011-12-15 NOTE — Anesthesia Procedure Notes (Signed)
Procedure Name: Intubation Date/Time: 12/15/2011 10:33 AM Performed by: [place], Ileanna Gemmill C Pre-anesthesia Checklist: Timeout performed, Patient identified, Emergency Drugs available, Suction available and Patient being monitored Patient Re-evaluated:Patient Re-evaluated prior to inductionOxygen Delivery Method: Circle System Utilized Preoxygenation: Pre-oxygenation with 100% oxygen Intubation Type: IV induction and Inhalational induction Ventilation: Mask ventilation without difficulty and Oral airway inserted - appropriate to patient size Laryngoscope Size: Mac and 4 Grade View: Grade II Tube type: Oral Tube size: 7.5 mm Number of attempts: 2 Airway Equipment and Method: stylet Secured at: 22 cm Tube secured with: Tape Dental Injury: Teeth and Oropharynx as per pre-operative assessment

## 2011-12-15 NOTE — Anesthesia Postprocedure Evaluation (Signed)
  Anesthesia Post-op Note  Patient: [name] [name]  Procedure(s) Performed:  ROBOTIC ASSISTED LAPAROSCOPIC RADICAL PROSTATECTOMY LEVEL 2 - BILATERAL PELVIC LYMPHADENECTOMY      Patient Location: PACU  Anesthesia Type: General  Level of Consciousness: awake and alert   Airway and Oxygen Therapy: Patient Spontanous Breathing  Post-op Pain: mild  Post-op Assessment: Post-op Vital signs reviewed, Patient's Cardiovascular Status Stable, Respiratory Function Stable, Patent Airway and No signs of Nausea or vomiting  Post-op Vital Signs: stable  Complications: No apparent anesthesia complications

## 2011-12-16 LAB — HEMOGLOBIN AND HEMATOCRIT, BLOOD
HCT: 40.8 % (ref 39.0–52.0)
Hemoglobin: 13.9 g/dL (ref 13.0–17.0)

## 2011-12-16 LAB — GLUCOSE, CAPILLARY: Glucose-Capillary: 120 mg/dL — ABNORMAL HIGH (ref 70–99)

## 2011-12-16 MED ORDER — OXYCODONE-ACETAMINOPHEN 5-325 MG PO TABS: 1.0000 | ORAL_TABLET | Freq: Four times a day (QID) | ORAL | Status: AC | PRN

## 2011-12-16 MED ORDER — SALINE SPRAY 0.65 % NA SOLN
1.0000 | NASAL | Status: DC | PRN
  Administered 2011-12-16: 1 via NASAL
  Filled 2011-12-16: qty 44

## 2011-12-16 MED ORDER — BISACODYL 10 MG RE SUPP
10.0000 mg | Freq: Once | RECTAL | Status: AC
  Administered 2011-12-16: 10 mg via RECTAL
  Filled 2011-12-16: qty 1

## 2011-12-16 MED ORDER — OXYCODONE-ACETAMINOPHEN 5-325 MG PO TABS
1.0000 | ORAL_TABLET | Freq: Four times a day (QID) | ORAL | Status: DC | PRN
  Administered 2011-12-16 – 2011-12-17 (×3): 2 via ORAL
  Filled 2011-12-16 (×4): qty 2

## 2011-12-16 NOTE — Progress Notes (Signed)
Patient ID: [name] [name], male   DOB: [birth year], 66 y.o.   MRN: [id number]  1 Day Post-Op Subjective: Pt with continued hematuria and intermittent clot obstruction of catheter requiring irrigation today.  Currently without complaints  Objective: Vital signs in last 24 hours: Temp:  [98 F (36.7 C)-98.4 F (36.9 C)] 98.4 F (36.9 C) (01/29 1528) Pulse Rate:  [61-87] 61  (01/29 1528) Resp:  [16-20] 18  (01/29 1528) BP: (137-171)/(74-84) 157/79 mmHg (01/29 1528) SpO2:  [93 %-97 %] 97 % (01/29 1528)  Intake/Output from previous day: 01/28 0701 - 01/29 0700 In: 7065 [P.O.:480; I.V.:5175; IV Piggyback:50] Out: 2615 [Urine:2500; Drains:15; Blood:100] Intake/Output this shift:    Physical Exam:  General: Alert and oriented GU: Urine clear in tubing and draining well  Lab Results:  Basename 12/16/11 0422 12/15/11 1850 12/15/11 1402  HGB 13.9 14.6 14.6  HCT 40.8 42.5 43.2      Studies/Results:   Assessment/Plan: Will monitor overnight.  If no further catheter issues by tomorrow morning, will d/c home then.   LOS: 1 day   Dulcemaria Bula,LES 12/16/2011, 7:51 PM

## 2011-12-16 NOTE — Progress Notes (Signed)
Irrigated foley two times this am.  Patient tolerated fairly well. Many moderate sized clots were obtained.  Patient has also ambulated in hallways this am. Tolerated fair.  Will continue to monitor patient.

## 2011-12-16 NOTE — Progress Notes (Signed)
Patient ID: [name] [name], male   DOB: 1946/03/22, 66 y.o.   MRN: [id number]  1 Day Post-Op Subjective: The patient is doing well.  No nausea or vomiting. Pain is adequately controlled. He did require catheter irrigation a few times overnight but has had excellent UOP.  Objective: Vital signs in last 24 hours: Temp:  [96.7 F (35.9 C)-98.4 F (36.9 C)] 98.4 F (36.9 C) (01/29 0736) Pulse Rate:  [64-87] 75  (01/29 0736) Resp:  [11-20] 16  (01/29 0736) BP: (137-180)/(74-97) 164/84 mmHg (01/29 0736) SpO2:  [93 %-100 %] 95 % (01/29 0506) Weight:  [117.8 kg (259 lb 11.2 oz)] 117.8 kg (259 lb 11.2 oz) (01/28 1606)  Intake/Output from previous day: 01/28 0701 - 01/29 0700 In: 7065 [P.O.:480; I.V.:5175; IV Piggyback:50] Out: 2615 [Urine:2500; Drains:15; Blood:100] Intake/Output this shift:    Physical Exam:  General: Alert and oriented. CV: RRR Lungs: Clear bilaterally. GI: Soft, Nondistended. Incisions: Dressings intact. Urine: Pink and irrigating well this morning. Extremities: Nontender, no erythema, no edema.  Lab Results:  Basename 12/16/11 0422 12/15/11 1850 12/15/11 1402  HGB 13.9 14.6 14.6  HCT 40.8 42.5 43.2      Assessment/Plan: POD# 1 s/p robotic prostatectomy.  1) SL IVF 2) Ambulate, Incentive spirometry 3) Transition to oral pain medication (pt requests Percocet. 4) Dulcolax suppository 5) Monitor catheter drainage and drain output this morning 6) Plan for likely discharge later today if catheter drains well off traction.  If not will plan for tomorrow depending on reassessment later today.   Rolly Salter, Montez Hageman. MD   LOS: 1 day   Osceola Holian,LES 12/16/2011, 8:14 AM

## 2011-12-16 NOTE — Progress Notes (Signed)
Pt placed himself on his own home CPAP machine.

## 2011-12-16 NOTE — Clinical Documentation Improvement (Signed)
Hypertension Documentation Clarification Query  THIS DOCUMENT IS NOT A PERMANENT PART OF THE MEDICAL RECORD  TO RESPOND TO THE THIS QUERY, FOLLOW THE INSTRUCTIONS BELOW:  1. If needed, update documentation for the patient's encounter via the notes activity.  2. Access this query again and click edit on the In [employer].  3. After updating, or not, click F2 to complete all highlighted (required) fields concerning your review. Select "additional documentation in the medical record" OR "no additional documentation provided".  4. Click Sign note button.  5. The deficiency will fall out of your In Basket *Please let us know if you are not able to complete this workflow by phone or e-mail (listed below).        12/16/11  Dear Dr. Luella Cook  Marton Redwood  In an effort to better capture your patient's severity of illness, reflect appropriate length of stay and utilization of resources, a review of the patient medical record has revealed the following indicators.    Based on your clinical judgment, please clarify and document in a progress note and/or discharge summary the clinical condition associated with the following supporting information:  In responding to this query please exercise your independent judgment.  The fact that a query is asked, does not imply that any particular answer is desired or expected.  Pt admitted with prostate cancer.  According to H/P pt w/ HTN. Pt's current vital signs demonstratate an elevation in B/Ps. Please clarify HTN can be further specified as one of the diagnoses listed below and document in pn or d/c summary.    Possible Clinical Conditions?   " Portal Hypertension  " Accelerated Hypertension  " Malignant Hypertension  " Or Other Condition __________________________  " Cannot Clinically Determine   Supporting Information:  Risk Factors: Prostate Ca, HTN, PROSTATECTOMY    Signs and Symptoms: 12/15/11 B/Ps 175/81 174/84   179/84     180/89  12/16/11 B/Ps 171/84   170/74    Treatments hydrALAZINE (APRESOLINE)     You may use possible, probable, or suspect with inpatient documentation. Possible, probable, suspected diagnoses MUST be documented at the time of discharge.  Reviewed:  no additional documentation provided1/31/2013 ljh   Thank You,  Enis Slipper RN, BSN, CCDS Clinical Documentation Specialist [facility] Dept Pager: [phone] / E-mail: [email or web address]  Health Information Management 

## 2011-12-16 NOTE — Progress Notes (Signed)
Concerned about patient's output.  Since about 0830 this am, patient has voided only about 140 cc and has been irrigated 3 times.  Patient does not complain of discomfort of bladder.  Bladder scanned patient and 22 cc were in bladder. Lujean Rave was notified of situation. She followed up with patient and irrigated foley.  Will follow up with her this afternoon.

## 2011-12-17 MED FILL — Heparin Sodium (Porcine) Inj 1000 Unit/ML: INTRAMUSCULAR | Qty: 1 | Status: AC

## 2011-12-17 NOTE — Progress Notes (Signed)
Patient ID: [name] [name], male   DOB: 1946/08/16, 66 y.o.   MRN: [id number]  2 Days Post-Op Subjective: Pt without complaints overnight.  No further clot obstruction of catheter.  Objective: Vital signs in last 24 hours: Temp:  [97.1 F (36.2 C)-98.5 F (36.9 C)] 98.5 F (36.9 C) (01/30 0454) Pulse Rate:  [61-75] 64  (01/30 0454) Resp:  [16-20] 20  (01/30 0454) BP: (157-167)/(77-85) 167/82 mmHg (01/30 0454) SpO2:  [95 %-97 %] 95 % (01/30 0454)  Intake/Output from previous day: 01/29 0701 - 01/30 0700 In: 1065 [P.O.:360; I.V.:605] Out: 4830 [Urine:4615; Drains:215] Intake/Output this shift:    Physical Exam:  General: Alert and oriented CV: RRR Lungs: Clear Abdomen: Soft, ND Incisions: C/D/I GU: Urine clear Ext: NT, No erythema  Lab Results:  Basename 12/16/11 0422 12/15/11 1850 12/15/11 1402  HGB 13.9 14.6 14.6  HCT 40.8 42.5 43.2    Studies/Results: No results found.  Assessment/Plan: - D/C drain - D/C home   LOS: 2 days   Vaeda Westall,LES 12/17/2011, 7:11 AM

## 2011-12-17 NOTE — Discharge Summary (Signed)
Date of admission: 12/15/2011  Date of discharge: 12/17/2011  Admission diagnosis: Prostate Cancer  Discharge diagnosis: Prostate Cancer  History and Physical: For full details, please see admission history and physical. Briefly, [name] [name] is a 66 y.o. gentleman with localized prostate cancer.  After discussing management/treatment options, he elected to proceed with surgical treatment.  Hospital Course: [name] was taken to the operating room on 12/15/2011 and underwent a robotic assisted laparoscopic radical prostatectomy. He tolerated this procedure well and without complications. Postoperatively, he was able to be transferred to a regular hospital room following recovery from anesthesia.  He was able to begin ambulating the night of surgery. He remained hemodynamically stable overnight although did have significant hematuria and required irrigation of his catheter multiple times.  He had excellent urine output with appropriately minimal output from his pelvic drain. He continued to have some issues with intermittent clot obstruction of his catheter on the afternoon of POD#1 and so was not discharged.  He required hand irrigation of his catheter and by that evening, his catheter was draining clear urine. His urine remained clear overnight.  He was transitioned to oral pain medication and was tolerating his diet.  He was able to be discharged home on the morning of POD #2.  Laboratory values:  Basename 12/16/11 0422 12/15/11 1850 12/15/11 1402  HGB 13.9 14.6 14.6  HCT 40.8 42.5 43.2    Disposition: Home  Discharge instruction: He was instructed to be ambulatory but to refrain from heavy lifting, strenuous activity, or driving. He was instructed on urethral catheter care.  Discharge medications:   Medication List  As of 12/17/2011  7:16 AM   START taking these medications         ciprofloxacin 500 MG tablet   Commonly known as: CIPRO   Take 1 tablet (500 mg total) by mouth 2  (two) times daily. Start day prior to office visit for foley removal      oxyCODONE-acetaminophen 5-325 MG per tablet   Commonly known as: PERCOCET   Take 1-2 tablets by mouth every 6 (six) hours as needed.         CONTINUE taking these medications         dextromethorphan 30 MG/5ML liquid   Commonly known as: DELSYM      diazepam 5 MG tablet   Commonly known as: VALIUM      eszopiclone 2 MG Tabs   Commonly known as: LUNESTA      FLUoxetine 20 MG capsule   Commonly known as: PROZAC      levothyroxine 200 MCG tablet   Commonly known as: SYNTHROID, LEVOTHROID      losartan-hydrochlorothiazide 50-12.5 MG per tablet   Commonly known as: HYZAAR      simvastatin 40 MG tablet   Commonly known as: ZOCOR      verapamil 180 MG CR tablet   Commonly known as: CALAN-SR         STOP taking these medications         aspirin EC 81 MG tablet          Where to get your medications    These are the prescriptions that you need to pick up.   You may get these medications from any pharmacy.         ciprofloxacin 500 MG tablet   oxyCODONE-acetaminophen 5-325 MG per tablet            Followup: He will followup in 1 week for  catheter removal and to discuss his surgical pathology results.

## 2012-01-06 ENCOUNTER — Encounter (HOSPITAL_COMMUNITY): Payer: Self-pay | Admitting: Urology

## 2012-02-06 ENCOUNTER — Ambulatory Visit (HOSPITAL_COMMUNITY)
Admission: RE | Admit: 2012-02-06 | Discharge: 2012-02-06 | Disposition: A | Discharge status: Home / Self Care | Payer: Medicare Other | Source: Ambulatory Visit | Attending: Urology | Admitting: Urology

## 2012-02-06 DIAGNOSIS — M79609 Pain in unspecified limb: Secondary | ICD-10-CM | POA: Insufficient documentation

## 2012-02-06 DIAGNOSIS — C61 Malignant neoplasm of prostate: Secondary | ICD-10-CM | POA: Insufficient documentation

## 2012-02-06 DIAGNOSIS — R52 Pain, unspecified: Secondary | ICD-10-CM

## 2012-02-06 NOTE — Progress Notes (Signed)
Bilateral lower extremity venous duplex completed.  Preliminary report is negative for DVT, SVT, or a Baker's cyst. 

## 2012-12-29 DIAGNOSIS — N529 Male erectile dysfunction, unspecified: Secondary | ICD-10-CM

## 2012-12-29 HISTORY — DX: Male erectile dysfunction, unspecified: N52.9

## 2014-05-05 DIAGNOSIS — Z85850 Personal history of malignant neoplasm of thyroid: Secondary | ICD-10-CM

## 2014-05-05 DIAGNOSIS — E039 Hypothyroidism, unspecified: Secondary | ICD-10-CM

## 2014-05-05 DIAGNOSIS — F419 Anxiety disorder, unspecified: Secondary | ICD-10-CM

## 2014-05-05 DIAGNOSIS — I10 Essential (primary) hypertension: Secondary | ICD-10-CM

## 2014-05-05 HISTORY — DX: Anxiety disorder, unspecified: F41.9

## 2014-05-05 HISTORY — DX: Essential (primary) hypertension: I10

## 2014-05-05 HISTORY — DX: Hypothyroidism, unspecified: E03.9

## 2014-05-05 HISTORY — DX: Personal history of malignant neoplasm of thyroid: Z85.850

## 2015-07-05 ENCOUNTER — Encounter: Payer: Self-pay | Admitting: *Deleted

## 2015-07-06 ENCOUNTER — Ambulatory Visit: Payer: Medicare Other | Admitting: Anesthesiology

## 2015-07-06 ENCOUNTER — Ambulatory Visit
Admission: RE | Admit: 2015-07-06 | Discharge: 2015-07-06 | Disposition: A | Discharge status: Home / Self Care | Payer: Medicare Other | Source: Ambulatory Visit | Attending: Unknown Physician Specialty | Admitting: Unknown Physician Specialty

## 2015-07-06 ENCOUNTER — Encounter: Payer: Self-pay | Admitting: Anesthesiology

## 2015-07-06 ENCOUNTER — Encounter
Admission: RE | Disposition: A | Discharge status: Home / Self Care | Payer: Self-pay | Source: Ambulatory Visit | Attending: Unknown Physician Specialty

## 2015-07-06 DIAGNOSIS — F17210 Nicotine dependence, cigarettes, uncomplicated: Secondary | ICD-10-CM | POA: Insufficient documentation

## 2015-07-06 DIAGNOSIS — D122 Benign neoplasm of ascending colon: Secondary | ICD-10-CM | POA: Diagnosis not present

## 2015-07-06 DIAGNOSIS — R011 Cardiac murmur, unspecified: Secondary | ICD-10-CM | POA: Insufficient documentation

## 2015-07-06 DIAGNOSIS — F419 Anxiety disorder, unspecified: Secondary | ICD-10-CM | POA: Diagnosis not present

## 2015-07-06 DIAGNOSIS — E039 Hypothyroidism, unspecified: Secondary | ICD-10-CM | POA: Diagnosis not present

## 2015-07-06 DIAGNOSIS — K640 First degree hemorrhoids: Secondary | ICD-10-CM | POA: Insufficient documentation

## 2015-07-06 DIAGNOSIS — K5730 Diverticulosis of large intestine without perforation or abscess without bleeding: Secondary | ICD-10-CM | POA: Diagnosis not present

## 2015-07-06 DIAGNOSIS — Z79899 Other long term (current) drug therapy: Secondary | ICD-10-CM | POA: Insufficient documentation

## 2015-07-06 DIAGNOSIS — G4730 Sleep apnea, unspecified: Secondary | ICD-10-CM | POA: Insufficient documentation

## 2015-07-06 DIAGNOSIS — J449 Chronic obstructive pulmonary disease, unspecified: Secondary | ICD-10-CM | POA: Diagnosis not present

## 2015-07-06 DIAGNOSIS — I10 Essential (primary) hypertension: Secondary | ICD-10-CM | POA: Diagnosis not present

## 2015-07-06 DIAGNOSIS — Z86010 Personal history of colonic polyps: Secondary | ICD-10-CM | POA: Diagnosis present

## 2015-07-06 DIAGNOSIS — Z8546 Personal history of malignant neoplasm of prostate: Secondary | ICD-10-CM | POA: Diagnosis not present

## 2015-07-06 DIAGNOSIS — I252 Old myocardial infarction: Secondary | ICD-10-CM | POA: Diagnosis not present

## 2015-07-06 DIAGNOSIS — D123 Benign neoplasm of transverse colon: Secondary | ICD-10-CM | POA: Insufficient documentation

## 2015-07-06 DIAGNOSIS — E785 Hyperlipidemia, unspecified: Secondary | ICD-10-CM | POA: Diagnosis not present

## 2015-07-06 HISTORY — DX: Chronic obstructive pulmonary disease, unspecified: J44.9

## 2015-07-06 HISTORY — PX: COLONOSCOPY WITH PROPOFOL: SHX5780

## 2015-07-06 HISTORY — DX: Anxiety disorder, unspecified: F41.9

## 2015-07-06 HISTORY — DX: Hyperlipidemia, unspecified: E78.5

## 2015-07-06 SURGERY — COLONOSCOPY WITH PROPOFOL
Anesthesia: General

## 2015-07-06 MED ORDER — PROPOFOL INFUSION 10 MG/ML OPTIME
INTRAVENOUS | Status: DC | PRN
  Administered 2015-07-06: 120 ug/kg/min via INTRAVENOUS

## 2015-07-06 MED ORDER — LIDOCAINE HCL (CARDIAC) 20 MG/ML IV SOLN
INTRAVENOUS | Status: DC | PRN
  Administered 2015-07-06: 20 mg via INTRAVENOUS

## 2015-07-06 MED ORDER — SODIUM CHLORIDE 0.9 % IV SOLN: INTRAVENOUS | Status: DC

## 2015-07-06 MED ORDER — SODIUM CHLORIDE 0.9 % IV SOLN
INTRAVENOUS | Status: DC
  Administered 2015-07-06: 11:00:00 via INTRAVENOUS

## 2015-07-06 MED ORDER — PROPOFOL 10 MG/ML IV BOLUS
INTRAVENOUS | Status: DC | PRN
  Administered 2015-07-06: 30 mg via INTRAVENOUS
  Administered 2015-07-06: 100 mg via INTRAVENOUS

## 2015-07-06 NOTE — Anesthesia Postprocedure Evaluation (Signed)
  Anesthesia Post-op Note  Patient: [name] [name]  Procedure(s) Performed: Procedure(s): COLONOSCOPY WITH PROPOFOL (N/A)  Anesthesia type:General  Patient location: PACU  Post pain: Pain level controlled  Post assessment: Post-op Vital signs reviewed, Patient's Cardiovascular Status Stable, Respiratory Function Stable, Patent Airway and No signs of Nausea or vomiting  Post vital signs: Reviewed and stable  Last Vitals:  Filed Vitals:   07/06/15 1247  BP: 128/77  Pulse: 53  Temp:   Resp: 20    Level of consciousness: awake, alert  and patient cooperative  Complications: No apparent anesthesia complications

## 2015-07-06 NOTE — Anesthesia Preprocedure Evaluation (Signed)
Anesthesia Evaluation  Patient identified by MRN, date of birth, ID band Patient awake    Reviewed: Allergy & Precautions, H&P , NPO status , Patient's Chart, lab work & pertinent test results  Airway Mallampati: III  TM Distance: >3 FB Neck ROM: limited    Dental  (+) Poor Dentition, Caps   Pulmonary COPDCurrent Smoker,  breath sounds clear to auscultation  Pulmonary exam normal       Cardiovascular Exercise Tolerance: Good hypertension, - Past MI Normal cardiovascular exam+ Valvular Problems/Murmurs Rhythm:regular Rate:Normal     Neuro/Psych negative neurological ROS  negative psych ROS   GI/Hepatic negative GI ROS, Neg liver ROS,   Endo/Other  Hypothyroidism   Renal/GU negative Renal ROS  negative genitourinary   Musculoskeletal   Abdominal   Peds  Hematology negative hematology ROS (+)   Anesthesia Other Findings Past Medical History:   Hypertension                                                 Heart murmur                                                   Comment:hx of 1960s   Hypothyroidism                                               Sleep apnea                                                    Comment:CPAP , report on chart , not used in last 2               weeks    GI bleed                                                       Comment:due to diverticulitis    Cancer                                                         Comment:prostate cancer    COPD (chronic obstructive pulmonary disease)                 Hyperlipidemia                                               Anxiety  Reproductive/Obstetrics negative OB ROS                             Anesthesia Physical Anesthesia Plan  ASA: III  Anesthesia Plan: General   Post-op Pain Management:    Induction:   Airway Management Planned:   Additional Equipment:    Intra-op Plan:   Post-operative Plan:   Informed Consent: I have reviewed the patients History and Physical, chart, labs and discussed the procedure including the risks, benefits and alternatives for the proposed anesthesia with the patient or authorized representative who has indicated his/her understanding and acceptance.   Dental Advisory Given  Plan Discussed with: Anesthesiologist, CRNA and Surgeon  Anesthesia Plan Comments:         Anesthesia Quick Evaluation

## 2015-07-06 NOTE — Transfer of Care (Signed)
Immediate Anesthesia Transfer of Care Note  Patient: [name] [name]  Procedure(s) Performed: Procedure(s): COLONOSCOPY WITH PROPOFOL (N/A)  Patient Location: Endoscopy Unit  Anesthesia Type:General  Level of Consciousness: sedated  Airway & Oxygen Therapy: Patient Spontanous Breathing and Patient connected to nasal cannula oxygen  Post-op Assessment: Report given to RN and Post -op Vital signs reviewed and stable  Post vital signs: Reviewed and stable  Last Vitals:  Filed Vitals:   07/06/15 1050  BP: 171/84  Pulse: 68  Temp: 35.9 C  Resp: 24    Complications: No apparent anesthesia complications

## 2015-07-06 NOTE — Op Note (Signed)
[facility] Gastroenterology Patient Name: [name] [name] Procedure Date: 07/06/2015 11:30 AM MRN: [id number] Account #: [id number] Date of Birth: 07-09-1946 Admit Type: Outpatient Age: 69 Room: [facility] ENDO ROOM 1 Gender: Male Note Status: Finalized Procedure:         Colonoscopy Indications:       Personal history of colonic polyps Providers:         Manya Silvas, MD Referring MD:      Leonie Douglas. Doy Hutching, MD (Referring MD) Medicines:         Propofol per Anesthesia Complications:     No immediate complications. Procedure:         Pre-Anesthesia Assessment:                    - After reviewing the risks and benefits, the patient was                     deemed in satisfactory condition to undergo the procedure.                    After obtaining informed consent, the colonoscope was                     passed under direct vision. Throughout the procedure, the                     patient's blood pressure, pulse, and oxygen saturations                     were monitored continuously. The Olympus PCF-H180AL                     colonoscope ( S#: [id number] ) was introduced through the                     anus and advanced to the the cecum, identified by                     appendiceal orifice and ileocecal valve. The colonoscopy                     was somewhat difficult due to significant looping.                     Successful completion of the procedure was aided by                     applying abdominal pressure. The patient tolerated the                     procedure well. The quality of the bowel preparation was                     adequate to identify polyps. Findings:      A small polyp was found in the ascending colon. The polyp was sessile.       The polyp was removed with a cold snare. Resection and retrieval were       complete.      Two sessile polyps were found in the transverse colon. The polyps were       diminutive in size. These polyps were removed with  a jumbo cold forceps.       Resection and retrieval were complete.      Internal hemorrhoids were  found during endoscopy. The hemorrhoids were       small and Grade I (internal hemorrhoids that do not prolapse).      Many small-mouthed diverticula were found in the sigmoid colon and in       the descending colon. Impression:        - One small polyp in the ascending colon. Resected and                     retrieved.                    - Two diminutive polyps in the transverse colon. Resected                     and retrieved.                    - Internal hemorrhoids.                    - Diverticulosis in the sigmoid colon and in the                     descending colon. Recommendation:    - Await pathology results. See your cardiologist about                     your Left Bundle Branch Block Manya Silvas, MD 07/06/2015 12:07:57 PM This report has been signed electronically. Number of Addenda: 0 Note Initiated On: 07/06/2015 11:30 AM Scope Withdrawal Time: 0 hours 14 minutes 52 seconds  Total Procedure Duration: 0 hours 28 minutes 9 seconds       [facility]

## 2015-07-06 NOTE — H&P (Signed)
Primary Care Physician:  Idelle Crouch, MD Primary Gastroenterologist:  Dr. Vira Agar  Pre-Procedure History & Physical: HPI:  [name] [name] is a 69 y.o. male is here for an colonoscopy.   Past Medical History  Diagnosis Date  . Hypertension   . Heart murmur     hx of 1960s  . Hypothyroidism   . Sleep apnea     CPAP , report on chart , not used in last 2 weeks   . GI bleed     due to diverticulitis   . Cancer     prostate cancer   . COPD (chronic obstructive pulmonary disease)   . Hyperlipidemia   . Anxiety     Past Surgical History  Procedure Laterality Date  . Hernia repair      umbilical hernia repair   . Back surgery      lumbar 1987   . Thyroidectomy    . Cervical disc surgery    . Other surgical history      right rotator cuff surgery   . Other surgical history      bilateral tubes in ears   . Tonsillectomy    . Robot assisted laparoscopic radical prostatectomy  12/15/2011    Procedure: ROBOTIC ASSISTED LAPAROSCOPIC RADICAL PROSTATECTOMY LEVEL 2;  Surgeon: Dutch Gray, MD;  Location: [facility] ORS;  Service: Urology;  Laterality: N/A;        Prior to Admission medications   Medication Sig Start Date End Date Taking? Authorizing Provider  FLUoxetine (PROZAC) 20 MG capsule Take 20 mg by mouth daily before breakfast.   Yes Historical Provider, MD  levothyroxine (SYNTHROID, LEVOTHROID) 200 MCG tablet Take 200 mcg by mouth daily before breakfast.   Yes Historical Provider, MD  losartan-hydrochlorothiazide (HYZAAR) 50-12.5 MG per tablet Take 1 tablet by mouth daily before breakfast.   Yes Historical Provider, MD  simvastatin (ZOCOR) 40 MG tablet Take 40 mg by mouth daily before breakfast.   Yes Historical Provider, MD  verapamil (CALAN-SR) 180 MG CR tablet Take 180 mg by mouth daily before breakfast.   Yes Historical Provider, MD  dextromethorphan (DELSYM) 30 MG/5ML liquid Take 60 mg by mouth as needed. Cough     Historical Provider, MD  diazepam (VALIUM) 5 MG tablet  Take 5 mg by mouth every 6 (six) hours as needed. Muscle spasm     Historical Provider, MD  eszopiclone (LUNESTA) 2 MG TABS Take 2 mg by mouth at bedtime. Take immediately before bedtime    Historical Provider, MD    Allergies as of 05/28/2015  . (No Known Allergies)    History reviewed. No pertinent family history.  Social History   Social History  . Marital Status: Single    Spouse Name: N/A  . Number of Children: N/A  . Years of Education: N/A   Occupational History  . Not on file.   Social History Main Topics  . Smoking status: Current Every Day Smoker -- 1.00 packs/day for 35 years  . Smokeless tobacco: Never Used  . Alcohol Use: No  . Drug Use: No  . Sexual Activity: Not on file   Other Topics Concern  . Not on file   Social History Narrative    Review of Systems: See HPI, otherwise negative ROS  Physical Exam: BP 171/84 mmHg  Pulse 68  Temp(Src) 96.7 F (35.9 C) (Tympanic)  Resp 24  Ht 6\' 2"  (1.88 m)  Wt 121.564 kg (268 lb)  BMI 34.39 kg/m2  SpO2 98% General:  Alert,  pleasant and cooperative in NAD Head:  Normocephalic and atraumatic. Neck:  Supple; no masses or thyromegaly. Lungs:  Clear throughout to auscultation.    Heart:  Regular rate and rhythm. Abdomen:  Soft, nontender and nondistended. Normal bowel sounds, without guarding, and without rebound.   Neurologic:  Alert and  oriented x4;  grossly normal neurologically.  Impression/Plan: [name] [name] is here for an colonoscopy to be performed for [facility] colon polyps  Risks, benefits, limitations, and alternatives regarding  colonoscopy have been reviewed with the patient.  Questions have been answered.  All parties agreeable.   Gaylyn Cheers, MD  07/06/2015, 11:27 AM

## 2015-07-09 ENCOUNTER — Encounter: Payer: Self-pay | Admitting: Unknown Physician Specialty

## 2015-07-09 LAB — SURGICAL PATHOLOGY

## 2015-08-06 ENCOUNTER — Other Ambulatory Visit: Payer: Self-pay | Admitting: Physical Medicine and Rehabilitation

## 2015-08-06 DIAGNOSIS — M4726 Other spondylosis with radiculopathy, lumbar region: Secondary | ICD-10-CM

## 2015-08-06 DIAGNOSIS — M7918 Myalgia, other site: Secondary | ICD-10-CM

## 2015-08-13 ENCOUNTER — Ambulatory Visit
Admission: RE | Admit: 2015-08-13 | Discharge: 2015-08-13 | Disposition: A | Discharge status: Home / Self Care | Payer: Medicare Other | Source: Ambulatory Visit | Attending: Physical Medicine and Rehabilitation | Admitting: Physical Medicine and Rehabilitation

## 2015-08-13 DIAGNOSIS — M5126 Other intervertebral disc displacement, lumbar region: Secondary | ICD-10-CM | POA: Insufficient documentation

## 2015-08-13 DIAGNOSIS — M4726 Other spondylosis with radiculopathy, lumbar region: Secondary | ICD-10-CM

## 2015-08-13 DIAGNOSIS — M1288 Other specific arthropathies, not elsewhere classified, other specified site: Secondary | ICD-10-CM | POA: Diagnosis not present

## 2015-08-13 DIAGNOSIS — M545 Low back pain: Secondary | ICD-10-CM | POA: Diagnosis present

## 2015-08-13 DIAGNOSIS — M4806 Spinal stenosis, lumbar region: Secondary | ICD-10-CM | POA: Diagnosis not present

## 2015-08-13 DIAGNOSIS — M7918 Myalgia, other site: Secondary | ICD-10-CM

## 2015-08-15 DIAGNOSIS — M48062 Spinal stenosis, lumbar region with neurogenic claudication: Secondary | ICD-10-CM | POA: Insufficient documentation

## 2017-02-10 ENCOUNTER — Other Ambulatory Visit: Payer: Self-pay | Admitting: Internal Medicine

## 2017-02-10 DIAGNOSIS — R053 Chronic cough: Secondary | ICD-10-CM

## 2017-02-10 DIAGNOSIS — R05 Cough: Secondary | ICD-10-CM

## 2017-02-26 ENCOUNTER — Ambulatory Visit
Admission: RE | Admit: 2017-02-26 | Discharge: 2017-02-26 | Disposition: A | Discharge status: Home / Self Care | Payer: Medicare Other | Source: Ambulatory Visit | Attending: Internal Medicine | Admitting: Internal Medicine

## 2017-02-26 DIAGNOSIS — I700 Atherosclerosis of aorta: Secondary | ICD-10-CM | POA: Diagnosis not present

## 2017-02-26 DIAGNOSIS — R053 Chronic cough: Secondary | ICD-10-CM

## 2017-02-26 DIAGNOSIS — R918 Other nonspecific abnormal finding of lung field: Secondary | ICD-10-CM | POA: Diagnosis not present

## 2017-02-26 DIAGNOSIS — I2510 Atherosclerotic heart disease of native coronary artery without angina pectoris: Secondary | ICD-10-CM | POA: Diagnosis not present

## 2017-02-26 DIAGNOSIS — R05 Cough: Secondary | ICD-10-CM | POA: Diagnosis present

## 2017-06-22 DIAGNOSIS — R7303 Prediabetes: Secondary | ICD-10-CM

## 2017-06-22 HISTORY — DX: Prediabetes: R73.03

## 2017-12-01 ENCOUNTER — Ambulatory Visit
Admission: EM | Admit: 2017-12-01 | Discharge: 2017-12-01 | Disposition: A | Discharge status: Home / Self Care | Payer: Medicare Other | Attending: Family Medicine | Admitting: Family Medicine

## 2017-12-01 ENCOUNTER — Encounter: Payer: Self-pay | Admitting: Emergency Medicine

## 2017-12-01 ENCOUNTER — Other Ambulatory Visit: Payer: Self-pay

## 2017-12-01 DIAGNOSIS — I872 Venous insufficiency (chronic) (peripheral): Secondary | ICD-10-CM

## 2017-12-01 DIAGNOSIS — R609 Edema, unspecified: Secondary | ICD-10-CM

## 2017-12-01 NOTE — ED Provider Notes (Addendum)
[facility]    CSN: [id number] Arrival date & time: 12/01/17  1258     History   Chief Complaint Chief Complaint  Patient presents with  . Cellulitis  . Leg Swelling   HPI [name] [name] is a 72 y.o. male.   HPI  72 year old male presents with sister complaining of 2 days of increasing bilateral lower extremity edema.  If he is not been taking Lasix which he normally does it 40 mg a day on an as necessary basis and since he has run out of this prescription.  He is having it refilled today.  Recently undergone a cardiac echo showed his EF to be 55%.  States that the swelling has increased today more than usual.  He is also noticed a redness of his pretibial skin which he thought may represent a cellulitis.  Extensive fungal infection of his toesnails        Past Medical History:  Diagnosis Date  . Anxiety   . Cancer [facility])    prostate cancer   . COPD (chronic obstructive pulmonary disease) ([place])   . GI bleed    due to diverticulitis   . Heart murmur    hx of 1960s  . Hyperlipidemia   . Hypertension   . Hypothyroidism   . Sleep apnea    CPAP , report on chart , not used in last 2 weeks     There are no active problems to display for this patient.   Past Surgical History:  Procedure Laterality Date  . BACK SURGERY     lumbar 1987   . CERVICAL DISC SURGERY    . COLONOSCOPY WITH PROPOFOL N/A 07/06/2015   Procedure: COLONOSCOPY WITH PROPOFOL;  Surgeon: Manya Silvas, MD;  Location: [facility] ENDOSCOPY;  Service: Endoscopy;  Laterality: N/A;  . HERNIA REPAIR     umbilical hernia repair   . OTHER SURGICAL HISTORY     right rotator cuff surgery   . OTHER SURGICAL HISTORY     bilateral tubes in ears   . ROBOT ASSISTED LAPAROSCOPIC RADICAL PROSTATECTOMY  12/15/2011   Procedure: ROBOTIC ASSISTED LAPAROSCOPIC RADICAL PROSTATECTOMY LEVEL 2;  Surgeon: Dutch Gray, MD;  Location: [facility] ORS;  Service: Urology;  Laterality: N/A;      . THYROIDECTOMY    .  TONSILLECTOMY         Home Medications    Prior to Admission medications   Medication Sig Start Date End Date Taking? Authorizing Provider  albuterol (VENTOLIN HFA) 108 (90 Base) MCG/ACT inhaler Inhale 2 puffs into the lungs every 6 (six) hours as needed for wheezing or shortness of breath.   Yes [provider]  aspirin EC 81 MG tablet Take 81 mg by mouth daily.   Yes [provider]  budesonide-formoterol (SYMBICORT) 160-4.5 MCG/ACT inhaler Inhale 2 puffs into the lungs 2 (two) times daily.   Yes [provider]  diazepam (VALIUM) 5 MG tablet Take 5 mg by mouth every 6 (six) hours as needed. Muscle spasm    Yes [provider]  FLUoxetine (PROZAC) 20 MG capsule Take 20 mg by mouth daily before breakfast.   Yes [provider]  furosemide (LASIX) 40 MG tablet Take 40 mg by mouth daily.   Yes [provider]  HYDROcodone-acetaminophen (NORCO) 10-325 MG tablet Take 1 tablet by mouth 2 (two) times daily as needed.   Yes [provider]  levothyroxine (SYNTHROID, LEVOTHROID) 200 MCG tablet Take 200 mcg by mouth daily before  breakfast.   Yes [provider]  losartan-hydrochlorothiazide (HYZAAR) 50-12.5 MG per tablet Take 1 tablet by mouth daily before breakfast.   Yes [provider]  rOPINIRole (REQUIP) 1 MG tablet Take 1 mg by mouth at bedtime as needed.   Yes [provider]  simvastatin (ZOCOR) 40 MG tablet Take 40 mg by mouth daily before breakfast.   Yes [provider]  TiZANidine HCl (ZANAFLEX PO) Take 1 tablet by mouth 3 (three) times daily as needed.   Yes [provider]  verapamil (CALAN-SR) 180 MG CR tablet Take 180 mg by mouth daily before breakfast.   Yes [provider]  zolpidem (AMBIEN) 10 MG tablet Take 10 mg by mouth at bedtime as needed for sleep.   Yes [provider]    Family History Family History  Problem Relation Age of Onset  .  Hypertension Mother   . Anxiety disorder Mother   . Stroke Mother        "mini strokes"  . Emphysema Father   . Heart disease Father   . Scoliosis Father     Social History Social History   Tobacco Use  . Smoking status: Current Every Day Smoker    Packs/day: 1.00    Years: 35.00    Pack years: 35.00  . Smokeless tobacco: Never Used  Substance Use Topics  . Alcohol use: No  . Drug use: No     Allergies   Patient has no known allergies.   Review of Systems Review of Systems  Constitutional: Positive for activity change. Negative for appetite change, chills, fatigue and fever.  Skin: Positive for color change.  All other systems reviewed and are negative.    Physical Exam Triage Vital Signs ED Triage Vitals  Enc Vitals Group     BP 12/01/17 1415 (!) 157/85     Pulse Rate 12/01/17 1415 72     Resp 12/01/17 1415 20     Temp 12/01/17 1415 97.7 F (36.5 C)     Temp Source 12/01/17 1415 Oral     SpO2 12/01/17 1415 98 %     Weight 12/01/17 1415 287 lb (130.2 kg)     Height 12/01/17 1415 6\' 2"  (1.88 m)     Head Circumference --      Peak Flow --      Pain Score 12/01/17 1416 0     Pain Loc --      Pain Edu? --      Excl. in [place]? --    No data found.  Updated Vital Signs BP (!) 157/85 (BP Location: Left Arm)   Pulse 72   Temp 97.7 F (36.5 C) (Oral)   Resp 20   Ht 6\' 2"  (1.88 m)   Wt 287 lb (130.2 kg)   SpO2 98%   BMI 36.85 kg/m   Visual Acuity Right Eye Distance:   Left Eye Distance:   Bilateral Distance:    Right Eye Near:   Left Eye Near:    Bilateral Near:     Physical Exam  Constitutional: He is oriented to person, place, and time. He appears well-developed and well-nourished. No distress.  HENT:  Head: Normocephalic.  Eyes: Pupils are equal, round, and reactive to light. Right eye exhibits no discharge. Left eye exhibits no discharge.  Neck: Normal range of motion.  Musculoskeletal: Normal range of motion. He exhibits edema.    Neurological: He is alert and oriented to person, place, and time.  Skin:  Skin is warm and dry. He is not diaphoretic.  Refer to photographs for detail  Psychiatric: He has a normal mood and affect. His behavior is normal. Judgment and thought content normal.  Nursing note and vitals reviewed.            UC Treatments / Results  Labs (all labs ordered are listed, but only abnormal results are displayed) Labs Reviewed - No data to display  EKG  EKG Interpretation None       Radiology No results found.  Procedures Procedures (including critical care time)  Medications Ordered in UC Medications - No data to display   Initial Impression / Assessment and Plan / UC Course  I have reviewed the triage vital signs and the nursing notes.  Pertinent labs & imaging results that were available during my care of the patient were reviewed by me and considered in my medical decision making (see chart for details).     Plan: 1. Test/x-ray results and diagnosis reviewed with patient 2. rx as per orders; risks, benefits, potential side effects reviewed with patient 3. Recommend supportive treatment with elevating legs above the heart sufficient enough to control swelling.  Prescription for compression hose of medium compression 20-30 mmHg.  He will obtain  from a medical supply house that will actually measure his legs for proper fit.  I warned him not to drink too many fluids taking Lasix which is defeating the purpose of the Lasix.  I recommended that he follow-up with his primary care provider in a week or 2.  Encouraged ambulation as much as possible. 4. F/u prn if symptoms worsen or don't improve   Final Clinical Impressions(s) / UC Diagnoses   Final diagnoses:  Peripheral edema  Venous stasis dermatitis of both lower extremities    ED Discharge Orders    None       Controlled Substance Prescriptions [place] Controlled Substance Registry consulted? Not Applicable    Lorin Picket, PA-C 12/01/17 1850    Lorin Picket, PA-C 12/01/17 1851

## 2017-12-01 NOTE — ED Triage Notes (Signed)
Patient in today c/o bilateral LE Edema, Cellulitis.

## 2018-02-07 ENCOUNTER — Inpatient Hospital Stay
Admission: EM | Admit: 2018-02-07 | Discharge: 2018-02-10 | DRG: 193 | Disposition: A | Discharge status: Skilled Nursing Facility | Payer: Medicare Other | Attending: Specialist | Admitting: Specialist

## 2018-02-07 ENCOUNTER — Emergency Department: Payer: Medicare Other

## 2018-02-07 ENCOUNTER — Encounter: Payer: Self-pay | Admitting: Emergency Medicine

## 2018-02-07 ENCOUNTER — Other Ambulatory Visit: Payer: Self-pay

## 2018-02-07 DIAGNOSIS — J440 Chronic obstructive pulmonary disease with acute lower respiratory infection: Secondary | ICD-10-CM | POA: Diagnosis present

## 2018-02-07 DIAGNOSIS — E785 Hyperlipidemia, unspecified: Secondary | ICD-10-CM | POA: Diagnosis present

## 2018-02-07 DIAGNOSIS — I110 Hypertensive heart disease with heart failure: Secondary | ICD-10-CM | POA: Diagnosis present

## 2018-02-07 DIAGNOSIS — J9601 Acute respiratory failure with hypoxia: Secondary | ICD-10-CM | POA: Diagnosis present

## 2018-02-07 DIAGNOSIS — J441 Chronic obstructive pulmonary disease with (acute) exacerbation: Secondary | ICD-10-CM | POA: Diagnosis present

## 2018-02-07 DIAGNOSIS — I4510 Unspecified right bundle-branch block: Secondary | ICD-10-CM | POA: Diagnosis present

## 2018-02-07 DIAGNOSIS — Z79890 Hormone replacement therapy (postmenopausal): Secondary | ICD-10-CM | POA: Diagnosis not present

## 2018-02-07 DIAGNOSIS — E669 Obesity, unspecified: Secondary | ICD-10-CM | POA: Diagnosis present

## 2018-02-07 DIAGNOSIS — Z9181 History of falling: Secondary | ICD-10-CM

## 2018-02-07 DIAGNOSIS — Z6837 Body mass index (BMI) 37.0-37.9, adult: Secondary | ICD-10-CM

## 2018-02-07 DIAGNOSIS — Z7951 Long term (current) use of inhaled steroids: Secondary | ICD-10-CM

## 2018-02-07 DIAGNOSIS — E039 Hypothyroidism, unspecified: Secondary | ICD-10-CM | POA: Diagnosis present

## 2018-02-07 DIAGNOSIS — F329 Major depressive disorder, single episode, unspecified: Secondary | ICD-10-CM | POA: Diagnosis present

## 2018-02-07 DIAGNOSIS — Z79899 Other long term (current) drug therapy: Secondary | ICD-10-CM

## 2018-02-07 DIAGNOSIS — J181 Lobar pneumonia, unspecified organism: Principal | ICD-10-CM | POA: Diagnosis present

## 2018-02-07 DIAGNOSIS — Z7982 Long term (current) use of aspirin: Secondary | ICD-10-CM

## 2018-02-07 DIAGNOSIS — E876 Hypokalemia: Secondary | ICD-10-CM | POA: Diagnosis present

## 2018-02-07 DIAGNOSIS — Z8546 Personal history of malignant neoplasm of prostate: Secondary | ICD-10-CM | POA: Diagnosis not present

## 2018-02-07 DIAGNOSIS — F17210 Nicotine dependence, cigarettes, uncomplicated: Secondary | ICD-10-CM | POA: Diagnosis present

## 2018-02-07 DIAGNOSIS — I5033 Acute on chronic diastolic (congestive) heart failure: Secondary | ICD-10-CM | POA: Diagnosis present

## 2018-02-07 DIAGNOSIS — J189 Pneumonia, unspecified organism: Secondary | ICD-10-CM

## 2018-02-07 DIAGNOSIS — G4733 Obstructive sleep apnea (adult) (pediatric): Secondary | ICD-10-CM | POA: Diagnosis present

## 2018-02-07 DIAGNOSIS — J9600 Acute respiratory failure, unspecified whether with hypoxia or hypercapnia: Secondary | ICD-10-CM | POA: Diagnosis present

## 2018-02-07 LAB — BLOOD GAS, VENOUS
Acid-Base Excess: 30 mmol/L — ABNORMAL HIGH (ref 0.0–2.0)
Bicarbonate: 56.4 mmol/L — ABNORMAL HIGH (ref 20.0–28.0)
DELIVERY SYSTEMS: POSITIVE
Expiratory PAP: 5
FIO2: 0.4
INSPIRATORY PAP: 10
O2 SAT: 97.1 %
PATIENT TEMPERATURE: 37
pCO2, Ven: 66 mmHg — ABNORMAL HIGH (ref 44.0–60.0)
pH, Ven: 7.54 — ABNORMAL HIGH (ref 7.250–7.430)
pO2, Ven: 81 mmHg — ABNORMAL HIGH (ref 32.0–45.0)

## 2018-02-07 LAB — CBC WITH DIFFERENTIAL/PLATELET
BASOS ABS: 0 10*3/uL (ref 0–0.1)
BASOS PCT: 0 %
Eosinophils Absolute: 0 10*3/uL (ref 0–0.7)
Eosinophils Relative: 0 %
HCT: 39.7 % — ABNORMAL LOW (ref 40.0–52.0)
HEMOGLOBIN: 13.8 g/dL (ref 13.0–18.0)
LYMPHS PCT: 6 %
Lymphs Abs: 0.5 10*3/uL — ABNORMAL LOW (ref 1.0–3.6)
MCH: 32.1 pg (ref 26.0–34.0)
MCHC: 34.7 g/dL (ref 32.0–36.0)
MCV: 92.5 fL (ref 80.0–100.0)
Monocytes Absolute: 0.8 10*3/uL (ref 0.2–1.0)
Monocytes Relative: 9 %
NEUTROS ABS: 7.1 10*3/uL — AB (ref 1.4–6.5)
NEUTROS PCT: 85 %
Platelets: 154 10*3/uL (ref 150–440)
RBC: 4.29 MIL/uL — AB (ref 4.40–5.90)
RDW: 13.9 % (ref 11.5–14.5)
WBC: 8.4 10*3/uL (ref 3.8–10.6)

## 2018-02-07 LAB — COMPREHENSIVE METABOLIC PANEL
ALBUMIN: 3.4 g/dL — AB (ref 3.5–5.0)
ALK PHOS: 103 U/L (ref 38–126)
ALT: 75 U/L — AB (ref 17–63)
ANION GAP: 13 (ref 5–15)
AST: 64 U/L — AB (ref 15–41)
BUN: 28 mg/dL — AB (ref 6–20)
CALCIUM: 8.6 mg/dL — AB (ref 8.9–10.3)
CO2: 30 mmol/L (ref 22–32)
Chloride: 99 mmol/L — ABNORMAL LOW (ref 101–111)
Creatinine, Ser: 1.06 mg/dL (ref 0.61–1.24)
GFR calc Af Amer: >60 mL/min (ref >60)
GFR calc non Af Amer: >60 mL/min (ref >60)
GLUCOSE: 146 mg/dL — AB (ref 65–99)
Potassium: 3.3 mmol/L — ABNORMAL LOW (ref 3.5–5.1)
SODIUM: 142 mmol/L (ref 135–145)
Total Bilirubin: 1 mg/dL (ref 0.3–1.2)
Total Protein: 7.4 g/dL (ref 6.5–8.1)

## 2018-02-07 LAB — LACTIC ACID, PLASMA
Lactic Acid, Venous: 1.7 mmol/L (ref 0.5–1.9)
Lactic Acid, Venous: 1.4 mmol/L (ref 0.5–1.9)

## 2018-02-07 LAB — BRAIN NATRIURETIC PEPTIDE: B Natriuretic Peptide: 101 pg/mL — ABNORMAL HIGH (ref 0.0–100.0)

## 2018-02-07 LAB — TROPONIN I: Troponin I: 0.04 ng/mL (ref <0.03)

## 2018-02-07 MED ORDER — SODIUM CHLORIDE 0.9 % IV SOLN
500.0000 mg | INTRAVENOUS | Status: DC
  Filled 2018-02-07: qty 500

## 2018-02-07 MED ORDER — IPRATROPIUM-ALBUTEROL 0.5-2.5 (3) MG/3ML IN SOLN
3.0000 mL | Freq: Four times a day (QID) | RESPIRATORY_TRACT | Status: DC
Start: 2018-02-07 — End: 2018-02-10
  Administered 2018-02-07 – 2018-02-10 (×11): 3 mL via RESPIRATORY_TRACT
  Filled 2018-02-07 (×11): qty 3

## 2018-02-07 MED ORDER — VERAPAMIL HCL ER 180 MG PO TBCR
180.0000 mg | EXTENDED_RELEASE_TABLET | Freq: Every day | ORAL | Status: DC
  Administered 2018-02-08 – 2018-02-10 (×3): 180 mg via ORAL
  Filled 2018-02-07 (×3): qty 1

## 2018-02-07 MED ORDER — LOSARTAN POTASSIUM 50 MG PO TABS
50.0000 mg | ORAL_TABLET | Freq: Every day | ORAL | Status: DC
  Administered 2018-02-07 – 2018-02-10 (×4): 50 mg via ORAL
  Filled 2018-02-07 (×4): qty 1

## 2018-02-07 MED ORDER — HYDRALAZINE HCL 20 MG/ML IJ SOLN: 10.0000 mg | Freq: Four times a day (QID) | INTRAMUSCULAR | Status: DC | PRN

## 2018-02-07 MED ORDER — HYDROCHLOROTHIAZIDE 12.5 MG PO CAPS
12.5000 mg | ORAL_CAPSULE | Freq: Every day | ORAL | Status: DC
  Administered 2018-02-07 – 2018-02-10 (×4): 12.5 mg via ORAL
  Filled 2018-02-07 (×4): qty 1

## 2018-02-07 MED ORDER — LEVOTHYROXINE SODIUM 50 MCG PO TABS
200.0000 ug | ORAL_TABLET | Freq: Every day | ORAL | Status: DC
Start: 2018-02-08 — End: 2018-02-10
  Administered 2018-02-08 – 2018-02-10 (×3): 200 ug via ORAL
  Filled 2018-02-07 (×3): qty 4

## 2018-02-07 MED ORDER — SIMVASTATIN 40 MG PO TABS
40.0000 mg | ORAL_TABLET | Freq: Every day | ORAL | Status: DC
Start: 2018-02-08 — End: 2018-02-08
  Administered 2018-02-08: 40 mg via ORAL
  Filled 2018-02-07: qty 1

## 2018-02-07 MED ORDER — IPRATROPIUM-ALBUTEROL 0.5-2.5 (3) MG/3ML IN SOLN
RESPIRATORY_TRACT | Status: AC
  Administered 2018-02-07: 3 mL via RESPIRATORY_TRACT
  Filled 2018-02-07: qty 6

## 2018-02-07 MED ORDER — GUAIFENESIN 100 MG/5ML PO SOLN
10.0000 mL | Freq: Four times a day (QID) | ORAL | Status: DC | PRN
  Administered 2018-02-07 – 2018-02-08 (×2): 200 mg via ORAL
  Filled 2018-02-07 (×3): qty 10

## 2018-02-07 MED ORDER — HYDROCODONE-ACETAMINOPHEN 5-325 MG PO TABS
1.0000 | ORAL_TABLET | ORAL | Status: DC | PRN
  Administered 2018-02-07 – 2018-02-10 (×8): 2 via ORAL
  Administered 2018-02-10: 1 via ORAL
  Administered 2018-02-10: 2 via ORAL
  Filled 2018-02-07 (×3): qty 2
  Filled 2018-02-07: qty 1
  Filled 2018-02-07 (×6): qty 2

## 2018-02-07 MED ORDER — SODIUM CHLORIDE 0.9 % IV SOLN
1.0000 g | INTRAVENOUS | Status: DC
  Administered 2018-02-08 – 2018-02-10 (×3): 1 g via INTRAVENOUS
  Filled 2018-02-07 (×4): qty 10

## 2018-02-07 MED ORDER — ONDANSETRON HCL 4 MG/2ML IJ SOLN
INTRAMUSCULAR | Status: AC
  Administered 2018-02-07: 4 mg via INTRAVENOUS
  Filled 2018-02-07: qty 2

## 2018-02-07 MED ORDER — DOCUSATE SODIUM 100 MG PO CAPS
100.0000 mg | ORAL_CAPSULE | Freq: Two times a day (BID) | ORAL | Status: DC
  Administered 2018-02-07 – 2018-02-10 (×7): 100 mg via ORAL
  Filled 2018-02-07 (×7): qty 1

## 2018-02-07 MED ORDER — IPRATROPIUM-ALBUTEROL 0.5-2.5 (3) MG/3ML IN SOLN
3.0000 mL | Freq: Once | RESPIRATORY_TRACT | Status: AC
  Administered 2018-02-07: 3 mL via RESPIRATORY_TRACT

## 2018-02-07 MED ORDER — ZOLPIDEM TARTRATE 5 MG PO TABS
10.0000 mg | ORAL_TABLET | Freq: Every evening | ORAL | Status: DC | PRN
Start: 2018-02-07 — End: 2018-02-10
  Administered 2018-02-07 – 2018-02-09 (×3): 10 mg via ORAL
  Filled 2018-02-07 (×3): qty 2

## 2018-02-07 MED ORDER — HYDROCHLOROTHIAZIDE 12.5 MG PO CAPS: 12.5000 mg | ORAL_CAPSULE | Freq: Every day | ORAL | Status: DC

## 2018-02-07 MED ORDER — ACETAMINOPHEN 650 MG RE SUPP: 650.0000 mg | Freq: Four times a day (QID) | RECTAL | Status: DC | PRN

## 2018-02-07 MED ORDER — BUDESONIDE 0.25 MG/2ML IN SUSP
0.2500 mg | Freq: Two times a day (BID) | RESPIRATORY_TRACT | Status: DC
  Administered 2018-02-07 – 2018-02-08 (×2): 0.25 mg via RESPIRATORY_TRACT
  Filled 2018-02-07 (×2): qty 2

## 2018-02-07 MED ORDER — ONDANSETRON HCL 4 MG PO TABS: 4.0000 mg | ORAL_TABLET | Freq: Four times a day (QID) | ORAL | Status: DC | PRN

## 2018-02-07 MED ORDER — METHYLPREDNISOLONE SODIUM SUCC 125 MG IJ SOLR
60.0000 mg | INTRAMUSCULAR | Status: DC
  Administered 2018-02-07 – 2018-02-10 (×4): 60 mg via INTRAVENOUS
  Filled 2018-02-07 (×4): qty 2

## 2018-02-07 MED ORDER — SODIUM CHLORIDE 0.9 % IV SOLN
1.0000 g | Freq: Once | INTRAVENOUS | Status: AC
  Administered 2018-02-07: 1 g via INTRAVENOUS
  Filled 2018-02-07: qty 10

## 2018-02-07 MED ORDER — ONDANSETRON HCL 4 MG/2ML IJ SOLN
4.0000 mg | Freq: Four times a day (QID) | INTRAMUSCULAR | Status: DC | PRN
  Administered 2018-02-07: 4 mg via INTRAVENOUS

## 2018-02-07 MED ORDER — VENLAFAXINE HCL ER 75 MG PO CP24
150.0000 mg | ORAL_CAPSULE | Freq: Every day | ORAL | Status: DC
  Administered 2018-02-07 – 2018-02-10 (×4): 150 mg via ORAL
  Filled 2018-02-07: qty 2
  Filled 2018-02-07: qty 1
  Filled 2018-02-07 (×3): qty 2

## 2018-02-07 MED ORDER — FUROSEMIDE 40 MG PO TABS
40.0000 mg | ORAL_TABLET | Freq: Every day | ORAL | Status: DC
  Administered 2018-02-07 – 2018-02-10 (×4): 40 mg via ORAL
  Filled 2018-02-07 (×4): qty 1

## 2018-02-07 MED ORDER — LOSARTAN POTASSIUM-HCTZ 50-12.5 MG PO TABS: 1.0000 | ORAL_TABLET | Freq: Every day | ORAL | Status: DC

## 2018-02-07 MED ORDER — BISACODYL 5 MG PO TBEC
5.0000 mg | DELAYED_RELEASE_TABLET | Freq: Every day | ORAL | Status: DC | PRN
  Administered 2018-02-10: 5 mg via ORAL
  Filled 2018-02-07: qty 1

## 2018-02-07 MED ORDER — LOSARTAN POTASSIUM 50 MG PO TABS
50.0000 mg | ORAL_TABLET | Freq: Every day | ORAL | Status: DC
Start: 2018-02-08 — End: 2018-02-07

## 2018-02-07 MED ORDER — SODIUM CHLORIDE 0.9 % IV SOLN
500.0000 mg | Freq: Once | INTRAVENOUS | Status: AC
  Administered 2018-02-07: 500 mg via INTRAVENOUS
  Filled 2018-02-07: qty 500

## 2018-02-07 MED ORDER — ACETAMINOPHEN 325 MG PO TABS: 650.0000 mg | ORAL_TABLET | Freq: Four times a day (QID) | ORAL | Status: DC | PRN

## 2018-02-07 MED ORDER — TRAZODONE HCL 50 MG PO TABS: 25.0000 mg | ORAL_TABLET | Freq: Every evening | ORAL | Status: DC | PRN

## 2018-02-07 MED ORDER — NICOTINE 21 MG/24HR TD PT24
21.0000 mg | MEDICATED_PATCH | Freq: Every day | TRANSDERMAL | Status: DC
  Administered 2018-02-07 – 2018-02-10 (×4): 21 mg via TRANSDERMAL
  Filled 2018-02-07 (×4): qty 1

## 2018-02-07 MED ORDER — ASPIRIN EC 81 MG PO TBEC
81.0000 mg | DELAYED_RELEASE_TABLET | Freq: Every day | ORAL | Status: DC
  Administered 2018-02-07 – 2018-02-10 (×4): 81 mg via ORAL
  Filled 2018-02-07 (×4): qty 1

## 2018-02-07 MED ORDER — ENOXAPARIN SODIUM 40 MG/0.4ML ~~LOC~~ SOLN
40.0000 mg | SUBCUTANEOUS | Status: DC
  Administered 2018-02-07 – 2018-02-10 (×4): 40 mg via SUBCUTANEOUS
  Filled 2018-02-07 (×4): qty 0.4

## 2018-02-07 NOTE — Progress Notes (Signed)
Frequent non-prod. Cough. Quit smoking 5 days ago. Multiple bruises on body due to recent frequent falls @ home. Lives alone.

## 2018-02-07 NOTE — H&P (Signed)
[place] [facility] [place] NAME: [name] [name]    MR#:  [id number]  DATE OF BIRTH:  [birth year]  DATE OF ADMISSION:  02/07/2018  PRIMARY CARE PHYSICIAN: Idelle Crouch, MD   REQUESTING/REFERRING PHYSICIAN: Dr. Reita Cliche  CHIEF COMPLAINT: Shortness of breath   Chief Complaint  Patient presents with  . Respiratory Distress    HISTORY OF PRESENT ILLNESS:  [name] [name]  is a 72 y.o. male with a known history of essential hypertension, COPD, prostate cancer, anxiety brought in by EMS because of respiratory distress.  Patient has been having cough for last 3 -4 days, got worse last night and patient had continuous cough associated with respiratory distress, called EMS this morning.  Arrived by EMS with BiPAP.  Patient was severely dyspneic at home when EMS picked up.  Fever reported., BiPAP removed in the emergency room after short time.  And now on 3 L of oxygen,sats 95% .  Patient says that he feels better.  received, steroids,  Albuterol neb,antibiotic with Rocephin, Zithromax in the emergency room.  PAST MEDICAL HISTORY:   Past Medical History:  Diagnosis Date  . Anxiety   . Cancer [facility])    prostate cancer   . COPD (chronic obstructive pulmonary disease) ([place])   . GI bleed    due to diverticulitis   . Heart murmur    hx of 1960s  . Hyperlipidemia   . Hypertension   . Hypothyroidism   . Sleep apnea    CPAP , report on chart , not used in last 2 weeks     PAST SURGICAL HISTOIRY:   Past Surgical History:  Procedure Laterality Date  . BACK SURGERY     lumbar 1987   . CERVICAL DISC SURGERY    . COLONOSCOPY WITH PROPOFOL N/A 07/06/2015   Procedure: COLONOSCOPY WITH PROPOFOL;  Surgeon: Manya Silvas, MD;  Location: [facility] ENDOSCOPY;  Service: Endoscopy;  Laterality: N/A;  . HERNIA REPAIR     umbilical hernia repair   . OTHER SURGICAL HISTORY     right rotator cuff surgery   . OTHER SURGICAL HISTORY     bilateral tubes in ears   .  ROBOT ASSISTED LAPAROSCOPIC RADICAL PROSTATECTOMY  12/15/2011   Procedure: ROBOTIC ASSISTED LAPAROSCOPIC RADICAL PROSTATECTOMY LEVEL 2;  Surgeon: Dutch Gray, MD;  Location: [facility]S;  Service: Urology;  Laterality: N/A;      . THYROIDECTOMY    . TONSILLECTOMY      SOCIAL HISTORY:   Social History   Tobacco Use  . Smoking status: Current Every Day Smoker    Packs/day: 1.00    Years: 35.00    Pack years: 35.00  . Smokeless tobacco: Never Used  Substance Use Topics  . Alcohol use: No    FAMILY HISTORY:   Family History  Problem Relation Age of Onset  . Hypertension Mother   . Anxiety disorder Mother   . Stroke Mother        "mini strokes"  . Emphysema Father   . Heart disease Father   . Scoliosis Father     DRUG ALLERGIES:  No Known Allergies  REVIEW OF SYSTEMS:  CONSTITUTIONAL: No fever, fatigue or weakness.  EYES: No blurred or double vision.  EARS, NOSE, AND THROAT: No tinnitus or ear pain.  RESPIRATORY: Cough, shortness of breath, wheezing.  CARDIOVASCULAR: No chest pain, orthopnea, edema.  GASTROINTESTINAL: No nausea, vomiting, diarrhea or abdominal pain.  GENITOURINARY: No dysuria, hematuria.  ENDOCRINE:  No polyuria, nocturia,  HEMATOLOGY: No anemia, easy bruising or bleeding SKIN: No rash or lesion. MUSCULOSKELETAL: No joint pain or arthritis.   NEUROLOGIC: No tingling, numbness, weakness.  PSYCHIATRY: No anxiety or depression.   MEDICATIONS AT HOME:   Prior to Admission medications   Medication Sig Start Date End Date Taking? Authorizing Provider  albuterol (VENTOLIN HFA) 108 (90 Base) MCG/ACT inhaler Inhale 2 puffs into the lungs every 6 (six) hours as needed for wheezing or shortness of breath.   Yes [provider]  aspirin EC 81 MG tablet Take 81 mg by mouth daily.   Yes [provider]  budesonide-formoterol (SYMBICORT) 160-4.5 MCG/ACT inhaler Inhale 2 puffs into the lungs 2 (two) times daily.   Yes [provider]  diazepam  (VALIUM) 5 MG tablet Take 5 mg by mouth every 6 (six) hours as needed. Muscle spasm    Yes [provider]  furosemide (LASIX) 40 MG tablet Take 40 mg by mouth daily.   Yes [provider]  levothyroxine (SYNTHROID, LEVOTHROID) 200 MCG tablet Take 200 mcg by mouth daily before breakfast.   Yes [provider]  losartan-hydrochlorothiazide (HYZAAR) 50-12.5 MG per tablet Take 1 tablet by mouth daily before breakfast.   Yes [provider]  simvastatin (ZOCOR) 40 MG tablet Take 40 mg by mouth daily before breakfast.   Yes [provider]  venlafaxine XR (EFFEXOR-XR) 75 MG 24 hr capsule Take 150 mg by mouth daily. 01/18/18  Yes [provider]  verapamil (CALAN-SR) 180 MG CR tablet Take 180 mg by mouth daily before breakfast.   Yes [provider]  zolpidem (AMBIEN) 10 MG tablet Take 10 mg by mouth at bedtime as needed for sleep.   Yes [provider]      VITAL SIGNS:  Blood pressure (!) 207/118, pulse 77, temperature 99.3 F (37.4 C), temperature source Oral, resp. rate (!) 21, height 6\' 2"  (1.88 m), weight 131 kg (288 lb 12.8 oz), SpO2 100 %.  PHYSICAL EXAMINATION:  GENERAL:  72 y.o.-year-old patient lying in the bed with no acute distress.  At this time. EYES: Pupils equal, round, reactive to light  No scleral icterus. Extraocular muscles intact.  HEENT: Head atraumatic, normocephalic. Oropharynx and nasopharynx clear.  NECK:  Supple, no jugular venous distention. No thyroid enlargement, no tenderness.  LUNGS: Lungs very tight, diminished air entry bilaterally, no audible wheezing CARDIOVASCULAR: S1, S2 normal. No murmurs, rubs, or gallops.  ABDOMEN: Soft, nontender, nondistended. Bowel sounds present. No organomegaly or mass.  EXTREMITIES: No pedal edema, cyanosis, or clubbing.  NEUROLOGIC: Cranial nerves II through XII are intact. Muscle strength 5/5 in all extremities. Sensation intact. Gait not checked.  PSYCHIATRIC:  The patient is alert and oriented x 3.  SKIN: No obvious rash, lesion, or ulcer.   LABORATORY PANEL:   CBC Recent Labs  Lab 02/07/18 1132  WBC 8.4  HGB 13.8  HCT 39.7*  PLT 154   ------------------------------------------------------------------------------------------------------------------  Chemistries  Recent Labs  Lab 02/07/18 1132  NA 142  K 3.3*  CL 99*  CO2 30  GLUCOSE 146*  BUN 28*  CREATININE 1.06  CALCIUM 8.6*  AST 64*  ALT 75*  ALKPHOS 103  BILITOT 1.0   ------------------------------------------------------------------------------------------------------------------  Cardiac Enzymes Recent Labs  Lab 02/07/18 1132  TROPONINI 0.04*   ------------------------------------------------------------------------------------------------------------------  RADIOLOGY:  Dg Chest Portable 1 View  Result Date: 02/07/2018 CLINICAL DATA:  Increasing shortness of breath.  Cough. EXAM: PORTABLE CHEST 1 VIEW COMPARISON:  December 11, 2011 FINDINGS: Mild opacity in the left base. Mild opacity in the right base is stable and may represent vascular crowding. Stable cardiomegaly. No other interval changes or acute abnormalities. IMPRESSION: New opacity in the left base.  Recommend follow-up to resolution. Electronically Signed   By: Dorise Bullion III M.D   On: 02/07/2018 12:48    EKG:   Orders placed or performed during the hospital encounter of 02/07/18  . EKG 12-Lead  . EKG 12-Lead  . ED EKG  . ED EKG   EKG shows sinus rhythm with 81bpm, no ST changes, right bundle branch block, left axis deviation.  nonspecific ST-T change IMPRESSION AND PLAN:   72 year old male with acute respiratory failure secondary to community-acquired pneumonia, COPD exacerbation: Patient chest x-ray showed left base opacity.  Continue IV Rocephin, Zithromax, oxygen, bronchodilators and see how he does. 2.  COPD exacerbation: Continue IV steroids, bronchodilators, Pulmicort nebulizer. 3.   Essential hypertension, BP was very high when he came but now BP better after his breathing improved.  Continue home dose of BP medicines. 4.  Hypokalemia, replace potassium 5.  Depression: Continue home dose antidepressants with Effexor X.R  Hypothyroidism: Continue Synthyroid.  All the records are reviewed and case discussed with ED provider. Management plans discussed with the patient, family and they are in agreement.  CODE STATUS:full code  TOTAL TIME TAKING CARE OF THIS PATIENT: 55 minutes.    Epifanio Lesches M.D on 02/07/2018 at 2:36 PM  Between 7am to 6pm - Pager - [phone]  After 6pm go to [email or web address] - password [email or web address] [facility]  Office  [phone]  CC: Primary care physician; Idelle Crouch, MD  Note: This dictation was prepared with Dragon dictation along with smaller phrase technology. Any transcriptional errors that result from this process are unintentional.

## 2018-02-07 NOTE — ED Provider Notes (Signed)
[facility] Emergency Department Provider Note ____________________________________________   I have reviewed the triage vital signs and the triage nursing note.  HISTORY  Chief Complaint Respiratory Distress   Historian Level 5 Caveat History Limited by critical illness Arrived by EMS on BiPAP  HPI [name] [name] is a 72 y.o. male arrived on BiPAP, EMS reports they picked him up from home severely dyspneic.  It sounds like his O2 sat was around 94, but patient was severely tachypneic and short of breath and there was concern about CHF given elevated blood pressure and rales and volume overload state including lower extremity edema.  Patient himself states that he has been coughing just for a couple days.  Denies fever.  Sounds like he has soreness without specific chest pain or pleuritic chest pain.  No reported nausea vomiting diarrhea.   Past Medical History:  Diagnosis Date  . Anxiety   . Cancer [facility])    prostate cancer   . COPD (chronic obstructive pulmonary disease) ([place])   . GI bleed    due to diverticulitis   . Heart murmur    hx of 1960s  . Hyperlipidemia   . Hypertension   . Hypothyroidism   . Sleep apnea    CPAP , report on chart , not used in last 2 weeks     There are no active problems to display for this patient.   Past Surgical History:  Procedure Laterality Date  . BACK SURGERY     lumbar 1987   . CERVICAL DISC SURGERY    . COLONOSCOPY WITH PROPOFOL N/A 07/06/2015   Procedure: COLONOSCOPY WITH PROPOFOL;  Surgeon: Manya Silvas, MD;  Location: [facility] ENDOSCOPY;  Service: Endoscopy;  Laterality: N/A;  . HERNIA REPAIR     umbilical hernia repair   . OTHER SURGICAL HISTORY     right rotator cuff surgery   . OTHER SURGICAL HISTORY     bilateral tubes in ears   . ROBOT ASSISTED LAPAROSCOPIC RADICAL PROSTATECTOMY  12/15/2011   Procedure: ROBOTIC ASSISTED LAPAROSCOPIC RADICAL PROSTATECTOMY LEVEL 2;  Surgeon: Dutch Gray, MD;   Location: [facility] ORS;  Service: Urology;  Laterality: N/A;      . THYROIDECTOMY    . TONSILLECTOMY      Prior to Admission medications   Medication Sig Start Date End Date Taking? Authorizing Provider  albuterol (VENTOLIN HFA) 108 (90 Base) MCG/ACT inhaler Inhale 2 puffs into the lungs every 6 (six) hours as needed for wheezing or shortness of breath.   Yes [provider]  aspirin EC 81 MG tablet Take 81 mg by mouth daily.   Yes [provider]  budesonide-formoterol (SYMBICORT) 160-4.5 MCG/ACT inhaler Inhale 2 puffs into the lungs 2 (two) times daily.   Yes [provider]  diazepam (VALIUM) 5 MG tablet Take 5 mg by mouth every 6 (six) hours as needed. Muscle spasm    Yes [provider]  furosemide (LASIX) 40 MG tablet Take 40 mg by mouth daily.   Yes [provider]  levothyroxine (SYNTHROID, LEVOTHROID) 200 MCG tablet Take 200 mcg by mouth daily before breakfast.   Yes [provider]  losartan-hydrochlorothiazide (HYZAAR) 50-12.5 MG per tablet Take 1 tablet by mouth daily before breakfast.   Yes [provider]  simvastatin (ZOCOR) 40 MG tablet Take 40 mg by mouth daily before breakfast.   Yes [provider]  venlafaxine XR (EFFEXOR-XR) 75 MG 24 hr capsule Take 150 mg by mouth daily. 01/18/18  Yes [provider]  verapamil (CALAN-SR) 180 MG CR tablet Take 180 mg by mouth daily before breakfast.   Yes [provider]  zolpidem (AMBIEN) 10 MG tablet Take 10 mg by mouth at bedtime as needed for sleep.   Yes [provider]    No Known Allergies  Family History  Problem Relation Age of Onset  . Hypertension Mother   . Anxiety disorder Mother   . Stroke Mother        "mini strokes"  . Emphysema Father   . Heart disease Father   . Scoliosis Father     Social History Social History   Tobacco Use  . Smoking status: Current Every Day Smoker    Packs/day: 1.00    Years: 35.00    Pack  years: 35.00  . Smokeless tobacco: Never Used  Substance Use Topics  . Alcohol use: No  . Drug use: No    Review of Systems  Limited, but denying active chest pain.  Positive for shortness of breath.  Positive for lower extremity edema.  ____________________________________________   PHYSICAL EXAM:  VITAL SIGNS: ED Triage Vitals  Enc Vitals Group     BP 02/07/18 1136 (!) 158/92     Pulse Rate 02/07/18 1136 88     Resp 02/07/18 1136 20     Temp 02/07/18 1136 99.3 F (37.4 C)     Temp Source 02/07/18 1136 Oral     SpO2 02/07/18 1136 91 %     Weight 02/07/18 1137 288 lb 12.8 oz (131 kg)     Height 02/07/18 1137 6\' 2"  (1.88 m)     Head Circumference --      Peak Flow --      Pain Score 02/07/18 1136 8     Pain Loc --      Pain Edu? --      Excl. in [place]? --      Constitutional: Alert, breathing reasonably well on the BiPAP.  HEENT   Head: Normocephalic and atraumatic.      Eyes: Conjunctivae are normal. Pupils equal and round.       Ears:         Nose: No congestion/rhinnorhea.   Mouth/Throat: Mucous membranes are moist.   Neck: No stridor. Cardiovascular/Chest: Normal rate, regular rhythm.  No murmurs, rubs, or gallops. Respiratory: On BiPAP.  Rhonchi throughout, somewhat decreased air movement throughout without obvious wheezing. Gastrointestinal: Soft. No distention, no guarding, no rebound. Nontender.  Obese Genitourinary/rectal:Deferred Musculoskeletal: Nontender with normal range of motion in all extremities. No joint effusions.  No lower extremity tenderness.  2+ lower extremity pitting edema bilateral Neurologic:  Normal speech and language. No gross or focal neurologic deficits are appreciated. Skin:  Skin is warm, dry and intact. No rash noted. Psychiatric: Cooperative, no agitation.   ____________________________________________  LABS (pertinent positives/negatives) I, Lisa Roca, MD the attending physician have reviewed the labs noted  below.  Labs Reviewed  BLOOD GAS, VENOUS - Abnormal; Notable for the following components:      Result Value   pH, Ven 7.54 (*)    pCO2, Ven 66 (*)    pO2, Ven 81.0 (*)    Bicarbonate 56.4 (*)    Acid-Base Excess 30.0 (*)    All other components within normal limits  CBC WITH DIFFERENTIAL/PLATELET - Abnormal; Notable for the following components:   RBC 4.29 (*)    HCT 39.7 (*)    Neutro Abs 7.1 (*)    Lymphs  Abs 0.5 (*)    All other components within normal limits  TROPONIN I - Abnormal; Notable for the following components:   Troponin I 0.04 (*)    All other components within normal limits  COMPREHENSIVE METABOLIC PANEL - Abnormal; Notable for the following components:   Potassium 3.3 (*)    Chloride 99 (*)    Glucose, Bld 146 (*)    BUN 28 (*)    Calcium 8.6 (*)    Albumin 3.4 (*)    AST 64 (*)    ALT 75 (*)    All other components within normal limits  BRAIN NATRIURETIC PEPTIDE - Abnormal; Notable for the following components:   B Natriuretic Peptide 101.0 (*)    All other components within normal limits  CULTURE, BLOOD (ROUTINE X 2)  CULTURE, BLOOD (ROUTINE X 2)  LACTIC ACID, PLASMA  LACTIC ACID, PLASMA    ____________________________________________    EKG I, Lisa Roca, MD, the attending physician have personally viewed and interpreted all ECGs.  81 bpm.  Right bundle branch block.  Left axis deviation.  Nonspecific ST and T wave ____________________________________________  RADIOLOGY   Chest x-ray portable reviewed and interpreted by me: Left lower lobe infiltrate Radiologist interpretation:   IMPRESSION: New opacity in the left base. Recommend follow-up to resolution. __________________________________________  PROCEDURES  Procedure(s) performed: None  Procedures  Critical Care performed: CRITICAL CARE Performed by: Lisa Roca   Total critical care time: 30 minutes  Critical care time was exclusive of separately billable procedures and  treating other patients.  Critical care was necessary to treat or prevent imminent or life-threatening deterioration.  Critical care was time spent personally by me on the following activities: development of treatment plan with patient and/or surrogate as well as nursing, discussions with consultants, evaluation of patient's response to treatment, examination of patient, obtaining history from patient or surrogate, ordering and performing treatments and interventions, ordering and review of laboratory studies, ordering and review of radiographic studies, pulse oximetry and re-evaluation of patient's condition.    ____________________________________________  ED COURSE / ASSESSMENT AND PLAN  Pertinent labs & imaging results that were available during my care of the patient were reviewed by me and considered in my medical decision making (see chart for details).   Patient taken off BiPAP for short period time, O2 sat down below 88%.  Patient became acutely dyspneic.  Clinically initially concerning for CHF exacerbation, however significant cough and rhonchi, possible pneumonia.  Patient has been denying fevers.  Chest x-ray shows left lower lobe infiltrate more consistent with pneumonia rather than CHF.  Patient without elevated [place] blood cell count, however he has had a left shift.  No fever at home, but his temperature was low-grade 99 here.  Patient does not meet criteria for sepsis I am not concerned about sepsis right now given no hypotension or tachycardia or fever elevated [place] blood cell count.  However with the respiratory trouble, hypoxia, and new left lower lobe infiltrate, I am treated for community acquired pneumonia and will admit to the hospitalist for further treatment.   CONSULTATIONS:   Hospitalist for admission.   Patient / Family / Caregiver informed of clinical course, medical decision-making process, and agree with  plan.     ___________________________________________   FINAL CLINICAL IMPRESSION(S) / ED DIAGNOSES   Final diagnoses:  Pneumonia of left lower lobe due to infectious organism [facility])      ___________________________________________        Note: This dictation was prepared  with Sales executive. Any transcriptional errors that result from this process are unintentional    Lisa Roca, MD 02/07/18 1411

## 2018-02-07 NOTE — Progress Notes (Signed)
Nurse observed [name] [name] was tearful. He reports his grief from partners death (5 years ago). 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 Chaplain offered listening presence and emotional support.    02/07/18 2000  Clinical Encounter Type  Visited With Patient  Visit Type Initial  Referral From Nurse  Stress Factors  Patient Stress Factors Loss

## 2018-02-07 NOTE — ED Triage Notes (Signed)
Patient from home via [facility] complaining of increasing shortness of breath and cough x3-4 days. Reports he has had non-productive cough. Denies fever. Reports taking fluid pill at home with no relief. Patient arrived on cpap. MD at bedside. Upon arrival to ED, patient trialed off of cpap. Increased work of breathing and discomfort noted. Bipap applied. Patient alert and oriented upon arrival.

## 2018-02-08 LAB — CBC
HEMATOCRIT: 38.5 % — AB (ref 40.0–52.0)
Hemoglobin: 12.9 g/dL — ABNORMAL LOW (ref 13.0–18.0)
MCH: 31.3 pg (ref 26.0–34.0)
MCHC: 33.6 g/dL (ref 32.0–36.0)
MCV: 93.4 fL (ref 80.0–100.0)
Platelets: 167 10*3/uL (ref 150–440)
RBC: 4.12 MIL/uL — ABNORMAL LOW (ref 4.40–5.90)
RDW: 13.7 % (ref 11.5–14.5)
WBC: 6.9 10*3/uL (ref 3.8–10.6)

## 2018-02-08 LAB — BASIC METABOLIC PANEL
ANION GAP: 11 (ref 5–15)
BUN: 23 mg/dL — AB (ref 6–20)
CALCIUM: 8.1 mg/dL — AB (ref 8.9–10.3)
CO2: 30 mmol/L (ref 22–32)
Chloride: 99 mmol/L — ABNORMAL LOW (ref 101–111)
Creatinine, Ser: 0.87 mg/dL (ref 0.61–1.24)
GFR calc Af Amer: >60 mL/min (ref >60)
Glucose, Bld: 207 mg/dL — ABNORMAL HIGH (ref 65–99)
Potassium: 3.2 mmol/L — ABNORMAL LOW (ref 3.5–5.1)
SODIUM: 140 mmol/L (ref 135–145)

## 2018-02-08 LAB — GLUCOSE, CAPILLARY: GLUCOSE-CAPILLARY: 156 mg/dL — AB (ref 65–99)

## 2018-02-08 LAB — MAGNESIUM: MAGNESIUM: 1.9 mg/dL (ref 1.7–2.4)

## 2018-02-08 MED ORDER — ATORVASTATIN CALCIUM 20 MG PO TABS
20.0000 mg | ORAL_TABLET | Freq: Every day | ORAL | Status: DC
  Administered 2018-02-09 – 2018-02-10 (×2): 20 mg via ORAL
  Filled 2018-02-08 (×2): qty 1

## 2018-02-08 MED ORDER — HYDROCOD POLST-CPM POLST ER 10-8 MG/5ML PO SUER
5.0000 mL | Freq: Two times a day (BID) | ORAL | Status: DC | PRN
  Administered 2018-02-10: 5 mL via ORAL
  Filled 2018-02-08 (×2): qty 5

## 2018-02-08 MED ORDER — AZITHROMYCIN 250 MG PO TABS
500.0000 mg | ORAL_TABLET | Freq: Every day | ORAL | Status: DC
  Administered 2018-02-08: 500 mg via ORAL
  Filled 2018-02-08: qty 2

## 2018-02-08 MED ORDER — BUDESONIDE 0.5 MG/2ML IN SUSP
0.5000 mg | Freq: Two times a day (BID) | RESPIRATORY_TRACT | Status: DC
  Administered 2018-02-08 – 2018-02-10 (×4): 0.5 mg via RESPIRATORY_TRACT
  Filled 2018-02-08 (×4): qty 2

## 2018-02-08 MED ORDER — POTASSIUM CHLORIDE CRYS ER 20 MEQ PO TBCR
40.0000 meq | EXTENDED_RELEASE_TABLET | Freq: Once | ORAL | Status: AC
  Administered 2018-02-08: 40 meq via ORAL
  Filled 2018-02-08: qty 2

## 2018-02-08 NOTE — Evaluation (Signed)
Physical Therapy Evaluation Patient Details Name: [name] [name] MRN: [id number] DOB: 1945/11/22 Today's Date: 02/08/2018   History of Present Illness  presented to ER secondary to worsening cough, SOB and respiratory distress; admitted with acute respiratory faillure secondary to CHF/COPD exacerbation.  Currently requiring 2L supplemental O2.  Clinical Impression  Upon evaluation, patient alert and oriented; follows all commands and demonstrates good insight/safety awareness.  Generally weak and deconditioned with significant deficits in dynamic balance reactions. Able to complete bed mobility with mod indep; sit/stand, basic transfers and gait (5') with RW, min assist.  Increased sway with dynamic standing activities, requiring constant assist from therapist for balance recovery/fall prevention.  Fatigues quickly with minimal activity.  Unsafe/unable to negotiate home environment indep at this time. Would benefit from skilled PT to address above deficits and promote optimal return to PLOF; recommend transition to STR upon discharge from acute hospitalization.  Vitals stable and WFL on 2L supplemental O2 throughout session.     Follow Up Recommendations SNF    Equipment Recommendations  Rolling walker with 5" wheels    Recommendations for Other Services       Precautions / Restrictions Precautions Precautions: Fall Restrictions Weight Bearing Restrictions: No      Mobility  Bed Mobility Overal bed mobility: Modified Independent                Transfers Overall transfer level: Needs assistance Equipment used: Rolling walker (2 wheeled) Transfers: Sit to/from Stand Sit to Stand: Min assist            Ambulation/Gait Ambulation/Gait assistance: Min assist Ambulation Distance (Feet): 5 Feet Assistive device: Rolling walker (2 wheeled)       General Gait Details: forward flexed posture, short shuffling steps; poor balance reactions requiring min/mod assist to  recover LOB  Stairs            Wheelchair Mobility    Modified Rankin (Stroke Patients Only)       Balance Overall balance assessment: Needs assistance Sitting-balance support: No upper extremity supported;Feet supported Sitting balance-Leahy Scale: Good     Standing balance support: Bilateral upper extremity supported Standing balance-Leahy Scale: Poor                               Pertinent Vitals/Pain Pain Assessment: Faces Faces Pain Scale: Hurts little more Pain Location: headache, back Pain Descriptors / Indicators: Aching Pain Intervention(s): Patient requesting pain meds-RN notified;Limited activity within patient's tolerance;Monitored during session;Repositioned    Home Living Family/patient expects to be discharged to:: Private residence Living Arrangements: Alone   Type of Home: House Home Access: Stairs to enter Entrance Stairs-Rails: Right Entrance Stairs-Number of Steps: 4+4+5  Home Layout: Able to live on main level with bedroom/bathroom Home Equipment: Cane - single point Additional Comments: Spends majority of time in kitchen and sunroom; sleeps in recliner in sunroom    Prior Function Level of Independence: Independent with assistive device(s)         Comments: Mod indep for ADLs and household distances with [facility]; + driving; electric cart in grocery/stores for longer distances.  Does endorse multiple fall history (at least 10 in previous six months)     Hand Dominance        Extremity/Trunk Assessment   Upper Extremity Assessment Upper Extremity Assessment: Overall WFL for tasks assessed    Lower Extremity Assessment Lower Extremity Assessment: Generalized weakness(grossly 4-/5 throughout)       Communication  Communication: No difficulties  Cognition Arousal/Alertness: Awake/alert Behavior During Therapy: WFL for tasks assessed/performed Overall Cognitive Status: Within Functional Limits for tasks assessed                                         General Comments      Exercises     Assessment/Plan    PT Assessment Patient needs continued PT services  PT Problem List Decreased range of motion;Decreased strength;Decreased activity tolerance;Decreased balance;Decreased mobility;Decreased knowledge of use of DME;Decreased safety awareness;Decreased knowledge of precautions;Cardiopulmonary status limiting activity;Obesity;Pain       PT Treatment Interventions DME instruction;Gait training;Stair training;Functional mobility training;Therapeutic activities;Therapeutic exercise;Balance training;Patient/family education    PT Goals (Current goals can be found in the Care Plan section)  Acute Rehab PT Goals Patient Stated Goal: to get stronger PT Goal Formulation: With patient Time For Goal Achievement: 02/22/18 Potential to Achieve Goals: Good    Frequency Min 2X/week   Barriers to discharge Decreased caregiver support      Co-evaluation               AM-PAC PT "6 Clicks" Daily Activity  Outcome Measure Difficulty turning over in bed (including adjusting bedclothes, sheets and blankets)?: None Difficulty moving from lying on back to sitting on the side of the bed? : None Difficulty sitting down on and standing up from a chair with arms (e.g., wheelchair, bedside commode, etc,.)?: None Help needed moving to and from a bed to chair (including a wheelchair)?: A Little Help needed walking in hospital room?: A Little Help needed climbing 3-5 steps with a railing? : A Lot 6 Click Score: 20    End of Session Equipment Utilized During Treatment: Gait belt;Oxygen Activity Tolerance: Patient limited by fatigue Patient left: in chair;with call bell/phone within reach;with chair alarm set Nurse Communication: Mobility status PT Visit Diagnosis: Muscle weakness (generalized) (M62.81);Difficulty in walking, not elsewhere classified (R26.2);Repeated falls (R29.6)    Time:  6811-5726 PT Time Calculation (min) (ACUTE ONLY): 28 min   Charges:   PT Evaluation $PT Eval Moderate Complexity: 1 Mod     PT G Codes:        Seaver Machia H. Owens Shark, PT, DPT, NCS 02/08/18, 10:41 PM [phone]

## 2018-02-08 NOTE — Clinical Social Work Note (Signed)
CSW consulted with no reason placed in "comments." Nursing reported that a friend of patient's had stopped by and expressed concern that patient may not be taking care of himself adequately. Nursing reports to CSW that patient is alert and oriented X4 and that he was clean and well kept upon admission. CSW will attempt to see patient tomorrow as time permits. Shela Leff MSW,LCSW [phone]

## 2018-02-08 NOTE — Progress Notes (Signed)
simvastain changed to atorvastatin per protocol due to D-D interaction with verapamil  Mitra Duling D Jonuel Butterfield, Pharm.D, BCPS Clinical Pharmacist

## 2018-02-08 NOTE — Progress Notes (Signed)
PHARMACIST - PHYSICIAN COMMUNICATION DR:   Sain[place] CONCERNING: Antibiotic IV to Oral Route Change Policy  RECOMMENDATION: This patient is receiving azithromycin by the intravenous route.  Based on criteria approved by the Pharmacy and Therapeutics Committee, the antibiotic(s) is/are being converted to the equivalent oral dose form(s).   DESCRIPTION: These criteria include:  Patient being treated for a respiratory tract infection, urinary tract infection, cellulitis or clostridium difficile associated diarrhea if on metronidazole  The patient is not neutropenic and does not exhibit a GI malabsorption state  The patient is eating (either orally or via tube) and/or has been taking other orally administered medications for a least 24 hours  The patient is improving clinically and has a Tmax < 100.5  If you have questions about this conversion, please contact the Pharmacy Department  []  ( [phone] )  [place] [x]  ( [phone] )  [facility] []  ( [phone] )  [facility] []  ( [phone] )  [facility] []  ( [phone] )   [facility]   

## 2018-02-08 NOTE — Progress Notes (Signed)
[place] at [place] NAME: [name] [name]    MR#:  [id number]  DATE OF BIRTH:  18-Jul-1946  SUBJECTIVE:   Patient here due to respiratory distress secondary to CHF/COPD/Pneumonia.  Much improved since yesterday, off BiPAP.  Patient says he has significant weakness when attempting to ambulate and has been falling frequently at home.  REVIEW OF SYSTEMS:    Review of Systems  Constitutional: Negative for chills and fever.  HENT: Negative for congestion and tinnitus.   Eyes: Negative for blurred vision and double vision.  Respiratory: Positive for shortness of breath. Negative for cough and wheezing.   Cardiovascular: Negative for chest pain, orthopnea and PND.  Gastrointestinal: Negative for abdominal pain, diarrhea, nausea and vomiting.  Genitourinary: Negative for dysuria and hematuria.  Musculoskeletal: Positive for falls.  Neurological: Positive for weakness. Negative for dizziness, sensory change and focal weakness.  All other systems reviewed and are negative.   Nutrition: heart healthy Tolerating Diet: Yes Tolerating PT: Await Eval.   DRUG ALLERGIES:  No Known Allergies  VITALS:  Blood pressure (!) 166/75, pulse 64, temperature 98.5 F (36.9 C), temperature source Oral, resp. rate 18, height 6\' 2"  (1.88 m), weight 131.9 kg (290 lb 12.6 oz), SpO2 96 %.  PHYSICAL EXAMINATION:   Physical Exam  GENERAL:  72 y.o.-year-old obese patient lying in bed in no acute distress.  EYES: Pupils equal, round, reactive to light and accommodation. No scleral icterus. Extraocular muscles intact.  HEENT: Head atraumatic, normocephalic. Oropharynx and nasopharynx clear.  NECK:  Supple, no jugular venous distention. No thyroid enlargement, no tenderness.  LUNGS: Normal breath sounds bilaterally, no wheezing, rales, rhonchi. No use of accessory muscles of respiration.  CARDIOVASCULAR: S1, S2 normal. No murmurs, rubs, or gallops.  ABDOMEN: Soft,  nontender, nondistended. Bowel sounds present. No organomegaly or mass.  EXTREMITIES: No cyanosis, clubbing, + 1 edema b/l    NEUROLOGIC: Cranial nerves II through XII are intact. No focal Motor or sensory deficits b/l. Globally weak.    PSYCHIATRIC: The patient is alert and oriented x 3.  SKIN: No obvious rash, lesion, or ulcer.    LABORATORY PANEL:   CBC Recent Labs  Lab 02/08/18 0259  WBC 6.9  HGB 12.9*  HCT 38.5*  PLT 167   ------------------------------------------------------------------------------------------------------------------  Chemistries  Recent Labs  Lab 02/07/18 1132 02/08/18 0259  NA 142 140  K 3.3* 3.2*  CL 99* 99*  CO2 30 30  GLUCOSE 146* 207*  BUN 28* 23*  CREATININE 1.06 0.87  CALCIUM 8.6* 8.1*  MG  --  1.9  AST 64*  --   ALT 75*  --   ALKPHOS 103  --   BILITOT 1.0  --    ------------------------------------------------------------------------------------------------------------------  Cardiac Enzymes Recent Labs  Lab 02/07/18 1132  TROPONINI 0.04*   ------------------------------------------------------------------------------------------------------------------  RADIOLOGY:  Dg Chest Portable 1 View  Result Date: 02/07/2018 CLINICAL DATA:  Increasing shortness of breath.  Cough. EXAM: PORTABLE CHEST 1 VIEW COMPARISON:  December 11, 2011 FINDINGS: Mild opacity in the left base. Mild opacity in the right base is stable and may represent vascular crowding. Stable cardiomegaly. No other interval changes or acute abnormalities. IMPRESSION: New opacity in the left base.  Recommend follow-up to resolution. Electronically Signed   By: Dorise Bullion III M.D   On: 02/07/2018 12:48     ASSESSMENT AND PLAN:   72 year old male with past medical history of hypertension, hyperlipidemia, hypothyroidism, obstructive sleep apnea, obesity, COPD, anxiety who presented  to the hospital due to shortness of breath.  1.  Acute respiratory failure with  hypoxia-secondary to COPD exacerbation due to pneumonia with underlying mild CHF. -Continue O2 supplementation.  Much improved since yesterday.  Off BiPAP.  Continue IV steroids, scheduled duo nebs, Pulmicort nebs, empiric antibiotics for pneumonia with IV ceftriaxone, Zithromax. -We will need to assess patient for oxygen prior to discharge.  2.  COPD exacerbation-secondary to pneumonia seen on chest x-ray. -We will treat the patient with IV steroids, scheduled duo nebs, Pulmicort nebs.  Continue IV ceftriaxone, Zithromax, follow clinically.  Assess the patient for oxygen prior to discharge.  3.  Pneumonia-continue IV ceftriaxone, Zithromax.  Follow cultures.  4.  Essential hypertension-continue losartan/HCTZ, verapamil.  5.  Tobacco abuse-continue nicotine patch.  6.  Hypokalemia-we will place the patient oral potassium supplements, repeat level in the morning.  Check mag level.  7.  CHF-acute on chronic diastolic dysfunction.  Continue Lasix, losartan/HCTZ, verapamil.  8.  History of frequent falls-await physical therapy evaluation to assess mobility.  All the records are reviewed and case discussed with Care Management/Social Worker. Management plans discussed with the patient, family and they are in agreement.  CODE STATUS: Full code  DVT Prophylaxis: Lovenox  TOTAL TIME TAKING CARE OF THIS PATIENT: 30 minutes.   POSSIBLE D/C IN 2-3 DAYS, DEPENDING ON CLINICAL CONDITION.   Henreitta Leber M.D on 02/08/2018 at 3:28 PM  Between 7am to 6pm - Pager - [phone]  After 6pm go to [email or web address] - Proofreader  [facility]  Office  [phone]  CC: Primary care physician; Idelle Crouch, MD

## 2018-02-09 ENCOUNTER — Encounter
Admission: RE | Admit: 2018-02-09 | Discharge: 2018-02-09 | Disposition: A | Discharge status: Home / Self Care | Payer: Medicare Other | Source: Ambulatory Visit | Attending: Internal Medicine | Admitting: Internal Medicine

## 2018-02-09 LAB — BASIC METABOLIC PANEL
ANION GAP: 11 (ref 5–15)
BUN: 28 mg/dL — AB (ref 6–20)
CHLORIDE: 96 mmol/L — AB (ref 101–111)
CO2: 32 mmol/L (ref 22–32)
Calcium: 8.2 mg/dL — ABNORMAL LOW (ref 8.9–10.3)
Creatinine, Ser: 0.81 mg/dL (ref 0.61–1.24)
GFR calc Af Amer: >60 mL/min (ref >60)
GFR calc non Af Amer: >60 mL/min (ref >60)
GLUCOSE: 156 mg/dL — AB (ref 65–99)
POTASSIUM: 3.3 mmol/L — AB (ref 3.5–5.1)
Sodium: 139 mmol/L (ref 135–145)

## 2018-02-09 LAB — CBC
HEMATOCRIT: 38.3 % — AB (ref 40.0–52.0)
HEMOGLOBIN: 13.2 g/dL (ref 13.0–18.0)
MCH: 31.7 pg (ref 26.0–34.0)
MCHC: 34.4 g/dL (ref 32.0–36.0)
MCV: 92 fL (ref 80.0–100.0)
PLATELETS: 178 10*3/uL (ref 150–440)
RBC: 4.17 MIL/uL — AB (ref 4.40–5.90)
RDW: 13.5 % (ref 11.5–14.5)
WBC: 6.7 10*3/uL (ref 3.8–10.6)

## 2018-02-09 LAB — GLUCOSE, CAPILLARY: Glucose-Capillary: 143 mg/dL — ABNORMAL HIGH (ref 65–99)

## 2018-02-09 MED ORDER — AZITHROMYCIN 250 MG PO TABS
250.0000 mg | ORAL_TABLET | Freq: Every day | ORAL | Status: DC
  Administered 2018-02-09 – 2018-02-10 (×2): 250 mg via ORAL
  Filled 2018-02-09 (×2): qty 1

## 2018-02-09 NOTE — NC FL2 (Signed)
[place] LEVEL OF CARE SCREENING TOOL     IDENTIFICATION  Patient Name: CECILIO [name] Birthdate: 1946/11/14 Sex: male Admission Date (Current Location): 02/07/2018  [place] and [place] Number:  Engineering geologist and Address:  [facility], [address] [place], [place] [address]      Provider Number: [id number]  Attending Physician Name and Address:  [name] Leber, MD  Relative Name and Phone Number:       Current Level of Care: Hospital Recommended Level of Care: [place] Prior Approval Number:    Date Approved/Denied:   PASRR Number: [id number]  Discharge Plan: SNF    Current Diagnoses: Patient Active Problem List   Diagnosis Date Noted  . Acute respiratory failure with hypoxemia (HCC) 02/07/2018    Orientation RESPIRATION BLADDER Height & Weight     Self, Time, Situation, Place  Normal, O2(1-2 liters) Continent Weight: 291 lb 1.6 oz (132 kg) Height:  6\' 2"  (188 cm)  BEHAVIORAL SYMPTOMS/MOOD NEUROLOGICAL BOWEL NUTRITION STATUS  (none) (none) Continent Diet(2 gm na)  AMBULATORY STATUS COMMUNICATION OF NEEDS Skin   Extensive Assist Verbally Normal                       Personal Care Assistance Level of Assistance  Bathing, Dressing Bathing Assistance: Limited assistance   Dressing Assistance: Limited assistance     Functional Limitations Info  Sight Sight Info: Impaired        SPECIAL CARE FACTORS FREQUENCY  PT (By licensed PT)                    Contractures Contractures Info: Not present    Additional Factors Info  Code Status, Allergies Code Status Info: full Allergies Info: nka           Current Medications (02/09/2018):  This is the current hospital active medication list Current Facility-Administered Medications  Medication Dose Route Frequency Provider Last Rate Last Dose  . acetaminophen (TYLENOL) tablet 650 mg  650 mg Oral Q6H PRN [name] Lesches, MD        Or  . acetaminophen (TYLENOL) suppository 650 mg  650 mg Rectal Q6H PRN [name] Lesches, MD      . aspirin EC tablet 81 mg  81 mg Oral Daily [name] Lesches, MD   81 mg at 02/09/18 0848  . atorvastatin (LIPITOR) tablet 20 mg  20 mg Oral q1800 [name] [name], [name] [name], RPH      . azithromycin (ZITHROMAX) tablet 500 mg  500 mg Oral q1800 [name] [name], RPH   500 mg at 02/08/18 1733  . bisacodyl (DULCOLAX) EC tablet 5 mg  5 mg Oral Daily PRN [name] Lesches, MD      . budesonide (PULMICORT) nebulizer solution 0.5 mg  0.5 mg Nebulization BID [name] Leber, MD   0.5 mg at 02/09/18 0739  . cefTRIAXone (ROCEPHIN) 1 g in sodium chloride 0.9 % 100 mL IVPB  1 g Intravenous Q24H [name] Lesches, MD   Stopped at 02/08/18 1250  . chlorpheniramine-HYDROcodone (TUSSIONEX) 10-8 MG/5ML suspension 5 mL  5 mL Oral Q12H PRN [name] Foreman, MD      . docusate sodium (COLACE) capsule 100 mg  100 mg Oral BID [name] Lesches, MD   100 mg at 02/09/18 0848  . enoxaparin (LOVENOX) injection 40 mg  40 mg Subcutaneous Q24H [name] Lesches, MD   40 mg at 02/08/18 1501  . furosemide (LASIX) tablet 40 mg  40 mg Oral Daily [name] Lesches, MD   40 mg at 02/09/18 0848  . guaiFENesin (ROBITUSSIN) 100 MG/5ML solution 200 mg  10 mL Oral Q6H PRN [name] Shelling, MD   200 mg at 02/08/18 0450  . hydrALAZINE (APRESOLINE) injection 10 mg  10 mg Intravenous Q6H PRN [name] Lesches, MD      . losartan (COZAAR) tablet 50 mg  50 mg Oral Daily [name] Coon, MD   50 mg at 02/09/18 0848   And  . hydrochlorothiazide (MICROZIDE) capsule 12.5 mg  12.5 mg Oral Daily [name] Coon, MD   12.5 mg at 02/09/18 0848  . HYDROcodone-acetaminophen (NORCO/VICODIN) 5-325 MG per tablet 1-2 tablet  1-2 tablet Oral Q4H PRN [name] Lesches, MD   2 tablet at 02/09/18 0847  . ipratropium-albuterol (DUONEB) 0.5-2.5 (3) MG/3ML nebulizer solution 3 mL  3 mL Nebulization Q6H [name] Lesches, MD   3 mL at 02/09/18  0739  . levothyroxine (SYNTHROID, LEVOTHROID) tablet 200 mcg  200 mcg Oral QAC breakfast [name] Lesches, MD   200 mcg at 02/09/18 0800  . methylPREDNISolone sodium succinate (SOLU-MEDROL) 125 mg/2 mL injection 60 mg  60 mg Intravenous Q24H [name] Lesches, MD   60 mg at 02/08/18 1500  . nicotine (NICODERM CQ - dosed in mg/24 hours) patch 21 mg  21 mg Transdermal Daily [name] [name], [name] Harps, MD   21 mg at 02/09/18 0849  . ondansetron (ZOFRAN) tablet 4 mg  4 mg Oral Q6H PRN [name] Lesches, MD       Or  . ondansetron (ZOFRAN) injection 4 mg  4 mg Intravenous Q6H PRN [name] Lesches, MD   4 mg at 02/07/18 1556  . traZODone (DESYREL) tablet 25 mg  25 mg Oral QHS PRN [name] Lesches, MD      . venlafaxine XR (EFFEXOR-XR) 24 hr capsule 150 mg  150 mg Oral Q breakfast [name] Lesches, MD   150 mg at 02/09/18 0800  . verapamil (CALAN-SR) CR tablet 180 mg  180 mg Oral QAC breakfast [name] Lesches, MD   180 mg at 02/09/18 0847  . zolpidem (AMBIEN) tablet 10 mg  10 mg Oral QHS PRN [name] Lesches, MD   10 mg at 02/08/18 2204     Discharge Medications: Please see discharge summary for a list of discharge medications.  Relevant Imaging Results:  Relevant Lab Results:   Additional Information ss: [id number]  [name] Leff, [name] [name]

## 2018-02-09 NOTE — Progress Notes (Signed)
[facility] [place] NAME: [name] [name]    MR#:  [id number]  DATE OF BIRTH:  03/15/1946  SUBJECTIVE:   Patient here due to respiratory distress secondary to CHF/COPD/Pneumonia.  his respiratory status much improved.  Seen by physical therapy and they recommended short-term rehab.  Patient complaining of some pain in his right foot and noted to have some bruising near his toes from his recent fall.  REVIEW OF SYSTEMS:    Review of Systems  Constitutional: Negative for chills and fever.  HENT: Negative for congestion and tinnitus.   Eyes: Negative for blurred vision and double vision.  Respiratory: Positive for shortness of breath. Negative for cough and wheezing.   Cardiovascular: Negative for chest pain, orthopnea and PND.  Gastrointestinal: Negative for abdominal pain, diarrhea, nausea and vomiting.  Genitourinary: Negative for dysuria and hematuria.  Musculoskeletal: Positive for falls.  Neurological: Positive for weakness. Negative for dizziness, sensory change and focal weakness.  All other systems reviewed and are negative.   Nutrition: heart healthy Tolerating Diet: Yes Tolerating PT: Await Eval.   DRUG ALLERGIES:  No Known Allergies  VITALS:  Blood pressure (!) 174/98, pulse 64, temperature (!) 97.5 F (36.4 C), temperature source Oral, resp. rate 20, height 6\' 2"  (1.88 m), weight 132 kg (291 lb 1.6 oz), SpO2 96 %.  PHYSICAL EXAMINATION:   Physical Exam  GENERAL:  72 y.o.-year-old obese patient lying in bed in no acute distress.  EYES: Pupils equal, round, reactive to light and accommodation. No scleral icterus. Extraocular muscles intact.  HEENT: Head atraumatic, normocephalic. Oropharynx and nasopharynx clear.  NECK:  Supple, no jugular venous distention. No thyroid enlargement, no tenderness.  LUNGS: Normal breath sounds bilaterally, no wheezing, rales, rhonchi. No use of accessory muscles of respiration.  CARDIOVASCULAR:  S1, S2 normal. No murmurs, rubs, or gallops.  ABDOMEN: Soft, nontender, nondistended. Bowel sounds present. No organomegaly or mass.  EXTREMITIES: No cyanosis, clubbing, + 1 edema b/l.  Bruising on the right dorsum part of the foot near the toes, good capillary refill.   NEUROLOGIC: Cranial nerves II through XII are intact. No focal Motor or sensory deficits b/l. Globally weak.    PSYCHIATRIC: The patient is alert and oriented x 3.  SKIN: No obvious rash, lesion, or ulcer.    LABORATORY PANEL:   CBC Recent Labs  Lab 02/09/18 0553  WBC 6.7  HGB 13.2  HCT 38.3*  PLT 178   ------------------------------------------------------------------------------------------------------------------  Chemistries  Recent Labs  Lab 02/07/18 1132 02/08/18 0259 02/09/18 0553  NA 142 140 139  K 3.3* 3.2* 3.3*  CL 99* 99* 96*  CO2 30 30 32  GLUCOSE 146* 207* 156*  BUN 28* 23* 28*  CREATININE 1.06 0.87 0.81  CALCIUM 8.6* 8.1* 8.2*  MG  --  1.9  --   AST 64*  --   --   ALT 75*  --   --   ALKPHOS 103  --   --   BILITOT 1.0  --   --    ------------------------------------------------------------------------------------------------------------------  Cardiac Enzymes Recent Labs  Lab 02/07/18 1132  TROPONINI 0.04*   ------------------------------------------------------------------------------------------------------------------  RADIOLOGY:  No results found.   ASSESSMENT AND PLAN:   72 year old male with past medical history of hypertension, hyperlipidemia, hypothyroidism, obstructive sleep apnea, obesity, COPD, anxiety who presented to the hospital due to shortness of breath.  1.  Acute respiratory failure with hypoxia-secondary to COPD exacerbation due to pneumonia with underlying mild CHF. -Continue O2  supplementation.  Much improved since yesterday.  Off BiPAP.  Continue IV steroids and will taper, cont. scheduled duo nebs, Pulmicort nebs, empiric antibiotics for pneumonia with IV  ceftriaxone, Zithromax.  2.  COPD exacerbation-secondary to pneumonia seen on chest x-ray. - cont. IV steroids but will taper, cont. scheduled duo nebs, Pulmicort nebs.  Continue IV ceftriaxone, Zithromax, follow clinically.  Assess the patient for oxygen prior to discharge.  3.  Pneumonia-continue IV ceftriaxone, Zithromax.  Follow cultures which are (-) so far.  4.  Essential hypertension-continue losartan/HCTZ, verapamil.  5.  Tobacco abuse-continue nicotine patch.  6.  Hypokalemia- continue supplements, repeat level in the morning.  Mag level is normal.  7.  CHF-acute on chronic diastolic dysfunction.  Continue Lasix, losartan/HCTZ, verapamil.  8.  History of frequent falls- seen by physical therapy and they recommend short-term rehab.  Social work aware.  Plan to discharge to skilled nursing facility tomorrow.  All the records are reviewed and case discussed with Care Management/Social Worker. Management plans discussed with the patient, family and they are in agreement.  CODE STATUS: Full code  DVT Prophylaxis: Lovenox  TOTAL TIME TAKING CARE OF THIS PATIENT: 30 minutes.   POSSIBLE D/C IN 1-2 DAYS, DEPENDING ON CLINICAL CONDITION.   Henreitta Leber M.D on 02/09/2018 at 3:11 PM  Between 7am to 6pm - Pager - [phone]  After 6pm go to [email or web address] - Proofreader  [facility]  Office  [phone]  CC: Primary care physician; Idelle Crouch, MD

## 2018-02-09 NOTE — Clinical Social Work Note (Signed)
Patient has accepted [facility] from bed offers extended. Sharyn Lull at [facility] is aware. Patient to discharge tomorrow. Shela Leff MSW,lCSW [phone]

## 2018-02-09 NOTE — Clinical Social Work Note (Signed)
Clinical Social Work Assessment  Patient Details  Name: [name] [name] MRN: [id number] Date of Birth: May 08, 1946  Date of referral:  02/09/18               Reason for consult:  Discharge Planning                Permission sought to share information with:  Chartered certified accountant granted to share information::  Yes, Verbal Permission Granted  Name::        Agency::     Relationship::     Contact Information:     Housing/Transportation Living arrangements for the past 2 months:  Single Family Home Source of Information:  Patient Patient Interpreter Needed:  None Criminal Activity/Legal Involvement Pertinent to Current Situation/Hospitalization:  No - Comment as needed Significant Relationships:    Lives with:  Siblings Do you feel safe going back to the place where you live?  Yes Need for family participation in patient care:  No (Coment)  Care giving concerns:  Lives alone.   Social Worker assessment / plan:  CSW noted that PT has recommended STR as of yesterday afternoon for patient. CSW spoke with patient this morning and introduced self and explained role and purpose of visit. Patient states that he agrees that he could benefit from STR. Patient states that PT got him to the chair yesterday and that this was all he did.   Employment status:  Retired Forensic scientist:  Medicare PT Recommendations:  [place] / Referral to community resources:     Patient/Family's Response to care:  Patient expressed appreciation for CSW assistance.  Patient/Family's Understanding of and Emotional Response to Diagnosis, Current Treatment, and Prognosis:  Patient is aware of his limitations.  Emotional Assessment Appearance:  Appears stated age Attitude/Demeanor/Rapport:  (pleasant and cooperative) Affect (typically observed):  Accepting, Adaptable Orientation:  Oriented to Self, Oriented to Place, Oriented to  Time, Oriented to  Situation Alcohol / Substance use:  Not Applicable Psych involvement (Current and /or in the community):  No (Comment)  Discharge Needs  Concerns to be addressed:  Care Coordination Readmission within the last 30 days:  No Current discharge risk:  None Barriers to Discharge:  No Barriers Identified   Shela Leff, LCSW 02/09/2018, 10:30 AM

## 2018-02-09 NOTE — Progress Notes (Signed)
Physical Therapy Treatment Patient Details Name: [name] [name] MRN: [id number] DOB: 11/19/1945 Today's Date: 02/09/2018    History of Present Illness presented to ER secondary to worsening cough, SOB and respiratory distress; admitted with acute respiratory faillure secondary to CHF/COPD exacerbation.  Currently requiring 2L supplemental O2.    PT Comments    Pt agreeable to PT; reports general malaise and with dizziness with seated/stand activities. Pt requires Min/Mod A for STS transfers with moderate posterior lean that pt has difficulty attempting to correct. Improves to Min A with static stand during functional activity. Participates with LE exercises with assist as needed. Pt refused up in chair at this time. Continue PT to progress strength, endurance and balance to improve functional mobility.    Follow Up Recommendations  SNF     Equipment Recommendations  Rolling walker with 5" wheels    Recommendations for Other Services       Precautions / Restrictions Precautions Precautions: Fall Restrictions Weight Bearing Restrictions: No    Mobility  Bed Mobility Overal bed mobility: Modified Independent             General bed mobility comments: Increased time/effort; use of rails  Transfers Overall transfer level: Needs assistance Equipment used: Rolling walker (2 wheeled) Transfers: Sit to/from Stand Sit to Stand: Min assist;Mod assist         General transfer comment: Performed several times with moderate posterior lean and 1 uncontrolled descent. Requires Min A once standing to maintain balance  Ambulation/Gait             General Gait Details: uanble; pt felt dizzy post stand activities   Stairs            Wheelchair Mobility    Modified Rankin (Stroke Patients Only)       Balance Overall balance assessment: Needs assistance Sitting-balance support: Feet supported Sitting balance-Leahy Scale: Good     Standing balance support:  Bilateral upper extremity supported Standing balance-Leahy Scale: Poor                              Cognition Arousal/Alertness: Awake/alert Behavior During Therapy: WFL for tasks assessed/performed Overall Cognitive Status: Within Functional Limits for tasks assessed                                        Exercises General Exercises - Lower Extremity Ankle Circles/Pumps: AROM;Both;10 reps;Supine Quad Sets: Strengthening;Both;10 reps;Supine Gluteal Sets: Strengthening;Both;10 reps;Supine Long Arc Quad: AROM;Both;10 reps;Seated(limited from full range) Heel Slides: AROM;Both;10 reps;Seated(limited range) Hip ABduction/ADduction: AAROM;Both;10 reps;Supine Other Exercises Other Exercises: Min A for stand tolerance with urinal use    General Comments        Pertinent Vitals/Pain Pain Assessment: No/denies pain    Home Living                      Prior Function            PT Goals (current goals can now be found in the care plan section)      Frequency    Min 2X/week      PT Plan Current plan remains appropriate    Co-evaluation              AM-PAC PT "6 Clicks" Daily Activity  Outcome Measure  Difficulty turning over in bed (including adjusting  bedclothes, sheets and blankets)?: None Difficulty moving from lying on back to sitting on the side of the bed? : A Little Difficulty sitting down on and standing up from a chair with arms (e.g., wheelchair, bedside commode, etc,.)?: Unable Help needed moving to and from a bed to chair (including a wheelchair)?: A Little Help needed walking in hospital room?: A Little Help needed climbing 3-5 steps with a railing? : A Lot 6 Click Score: 16    End of Session Equipment Utilized During Treatment: Gait belt;Oxygen Activity Tolerance: Patient limited by fatigue;Other (comment)(dizzy; poor balance) Patient left: in bed;with call bell/phone within reach;with bed alarm set   PT  Visit Diagnosis: Muscle weakness (generalized) (M62.81);Difficulty in walking, not elsewhere classified (R26.2);Repeated falls (R29.6)     Time: 3790-2409 PT Time Calculation (min) (ACUTE ONLY): 32 min  Charges:  $Therapeutic Exercise: 8-22 mins $Therapeutic Activity: 8-22 mins                    G CodesLarae Grooms, PTA 02/09/2018, 4:03 PM

## 2018-02-10 LAB — GLUCOSE, CAPILLARY: Glucose-Capillary: 138 mg/dL — ABNORMAL HIGH (ref 65–99)

## 2018-02-10 MED ORDER — POLYETHYLENE GLYCOL 3350 17 G PO PACK
17.0000 g | PACK | Freq: Every day | ORAL | Status: DC
  Administered 2018-02-10: 17 g via ORAL
  Filled 2018-02-10: qty 1

## 2018-02-10 MED ORDER — PREDNISONE 10 MG PO TABS: ORAL_TABLET | ORAL | Status: DC

## 2018-02-10 MED ORDER — LEVOFLOXACIN 750 MG PO TABS: 750.0000 mg | ORAL_TABLET | Freq: Every day | ORAL | 0 refills | Status: DC

## 2018-02-10 MED ORDER — BISACODYL 10 MG RE SUPP
10.0000 mg | Freq: Once | RECTAL | Status: AC
  Administered 2018-02-10: 10 mg via RECTAL
  Filled 2018-02-10: qty 1

## 2018-02-10 MED ORDER — IPRATROPIUM-ALBUTEROL 0.5-2.5 (3) MG/3ML IN SOLN: 3.0000 mL | Freq: Four times a day (QID) | RESPIRATORY_TRACT | Status: DC | PRN

## 2018-02-10 NOTE — Clinical Social Work Placement (Signed)
   CLINICAL SOCIAL WORK PLACEMENT  NOTE  Date:  02/10/2018  Patient Details  Name: [name] [name] MRN: [id number] Date of Birth: Oct 29, 1946  Clinical Social Work is seeking post-discharge placement for this patient at the [place] level of care (*CSW will initial, date and re-position this form in  chart as items are completed):  Yes   Patient/family provided with [facility] Work Department's list of facilities offering this level of care within the geographic area requested by the patient (or if unable, by the patient's family).  Yes   Patient/family informed of their freedom to choose among providers that offer the needed level of care, that participate in Medicare, Medicaid or managed care program needed by the patient, have an available bed and are willing to accept the patient.  Yes   Patient/family informed of [facility]'s ownership interest in [facility] and [facility], as well as of the fact that they are under no obligation to receive care at these facilities.  PASRR submitted to EDS on 02/09/18     PASRR number received on 02/09/18     Existing PASRR number confirmed on       FL2 transmitted to all facilities in geographic area requested by pt/family on 02/08/18     FL2 transmitted to all facilities within larger geographic area on       Patient informed that his/her managed care company has contracts with or will negotiate with certain facilities, including the following:        Yes   Patient/family informed of bed offers received.  Patient chooses bed at [facility])     Physician recommends and patient chooses bed at [facility])    Patient to be transferred to [facility]) on 02/10/18.  Patient to be transferred to facility by (EMS)     Patient family notified on 02/10/18 of transfer.  Name of family member notified:  (Patient is alert and oriented X4)     PHYSICIAN       Additional Comment:     _______________________________________________ Shela Leff, LCSW 02/10/2018, 2:03 PM

## 2018-02-10 NOTE — Care Management Important Message (Signed)
Important Message  Patient Details  Name: [name] [name] MRN: [id number] Date of Birth: 26-Oct-1946   Medicare Important Message Given:  Yes    Beverly Sessions, RN 02/10/2018, 12:39 PM

## 2018-02-10 NOTE — Progress Notes (Addendum)
Report given to [place] at [facility].   EMS called for transport.

## 2018-02-10 NOTE — Clinical Social Work Note (Signed)
Patient discharging today to [facility]. Sharyn Lull at [facility] is aware and discharge information sent. Nurse to call report and patient to transfer via EMS. Shela Leff MSW,LCSW [phone]

## 2018-02-10 NOTE — Discharge Summary (Signed)
[facility] NAME: [name] [name]    MR#:  [id number]  DATE OF BIRTH:  08/26/1946  DATE OF ADMISSION:  02/07/2018 ADMITTING PHYSICIAN: Epifanio Lesches, MD  DATE OF DISCHARGE: 02/10/2018  PRIMARY CARE PHYSICIAN: Idelle Crouch, MD    ADMISSION DIAGNOSIS:  Pneumonia of left lower lobe due to infectious organism ([place]) [J18.1]  DISCHARGE DIAGNOSIS:  Active Problems:   Acute respiratory failure with hypoxemia ([place])   SECONDARY DIAGNOSIS:   Past Medical History:  Diagnosis Date  . Anxiety   . Cancer [facility])    prostate cancer   . COPD (chronic obstructive pulmonary disease) ([place])   . GI bleed    due to diverticulitis   . Heart murmur    hx of 1960s  . Hyperlipidemia   . Hypertension   . Hypothyroidism   . Sleep apnea    CPAP , report on chart , not used in last 2 weeks     HOSPITAL COURSE:   72 year old male with past medical history of hypertension, hyperlipidemia, hypothyroidism, obstructive sleep apnea, obesity, COPD, anxiety who presented to the hospital due to shortness of breath.  1.  Acute respiratory failure with hypoxia-secondary to COPD exacerbation due to pneumonia with underlying mild CHF. [facility] patient was treated with IV steroids, scheduled duo nebs, Pulmicort nebs.  Patient was also given empiric antibiotics with IV ceftriaxone, Zithromax.  He has clinically improved is now off BiPAP. - Is being discharged on a oral prednisone taper, duo nebs as needed and maintenance of his inhalers.  He is also being discharged on 5 more days of oral Levaquin to treat his underlying pneumonia.  2.  COPD exacerbation-secondary to pneumonia seen on chest x-ray. -Patient was treated with IV steroids, scheduled duo nebs, Pulmicort nebs.  Patient has improved.  He is now being discharged on prednisone taper, duo nebs as needed, maintenance of his inhalers and oral Levaquin.  3.  Pneumonia- treated with IV ceftriaxone,  Zithromax now being discharged on oral Levaquin.  Patient's cultures have remained negative.  He is afebrile and hemodynamically stable.  4.  Essential hypertension- pt. Will continue losartan/HCTZ, verapamil.  5.  Hypokalemia- improved with supplementation and can be further followed as outpatient.   6.  CHF-acute on chronic diastolic dysfunction.  -Received some IV Lasix and responded to it and his volume status is improved.  Patient will continue his Lasix, losartan, HCTZ, verapamil.  7.  History of frequent falls- seen by physical therapy and they recommend short-term rehab and pt. Is being discharged there presently.   8. Hypothyroidism - pt. will cont. Synthroid.    DISCHARGE CONDITIONS:   Stable  CONSULTS OBTAINED:    DRUG ALLERGIES:  No Known Allergies  DISCHARGE MEDICATIONS:   Allergies as of 02/10/2018   No Known Allergies     Medication List    TAKE these medications   aspirin EC 81 MG tablet Take 81 mg by mouth daily.   budesonide-formoterol 160-4.5 MCG/ACT inhaler Commonly known as:  SYMBICORT Inhale 2 puffs into the lungs 2 (two) times daily.   diazepam 5 MG tablet Commonly known as:  VALIUM Take 5 mg by mouth every 6 (six) hours as needed. Muscle spasm   furosemide 40 MG tablet Commonly known as:  LASIX Take 40 mg by mouth daily.   ipratropium-albuterol 0.5-2.5 (3) MG/3ML Soln Commonly known as:  DUONEB Take 3 mLs by nebulization every 6 (six) hours as needed (Shortness of breath, wheezing.).  levofloxacin 750 MG tablet Commonly known as:  LEVAQUIN Take 1 tablet (750 mg total) by mouth daily for 5 days.   levothyroxine 200 MCG tablet Commonly known as:  SYNTHROID, LEVOTHROID Take 200 mcg by mouth daily before breakfast.   losartan-hydrochlorothiazide 50-12.5 MG tablet Commonly known as:  HYZAAR Take 1 tablet by mouth daily before breakfast.   predniSONE 10 MG tablet Commonly known as:  DELTASONE Label  & dispense according to the  schedule below. 5 Pills PO for 1 day then, 4 Pills PO for 1 day, 3 Pills PO for 1 day, 2 Pills PO for 1 day, 1 Pill PO for 1 days then STOP.   simvastatin 40 MG tablet Commonly known as:  ZOCOR Take 40 mg by mouth daily before breakfast.   venlafaxine XR 75 MG 24 hr capsule Commonly known as:  EFFEXOR-XR Take 150 mg by mouth daily.   VENTOLIN HFA 108 (90 Base) MCG/ACT inhaler Generic drug:  albuterol Inhale 2 puffs into the lungs every 6 (six) hours as needed for wheezing or shortness of breath.   verapamil 180 MG CR tablet Commonly known as:  CALAN-SR Take 180 mg by mouth daily before breakfast.   zolpidem 10 MG tablet Commonly known as:  AMBIEN Take 10 mg by mouth at bedtime as needed for sleep.         DISCHARGE INSTRUCTIONS:   DIET:  Cardiac diet  DISCHARGE CONDITION:  Stable  ACTIVITY:  Activity as tolerated  OXYGEN:  Home Oxygen: No.   Oxygen Delivery: room air  DISCHARGE LOCATION:  nursing home   If you experience worsening of your admission symptoms, develop shortness of breath, life threatening emergency, suicidal or homicidal thoughts you must seek medical attention immediately by calling 911 or calling your MD immediately  if symptoms less severe.  You Must read complete instructions/literature along with all the possible adverse reactions/side effects for all the Medicines you take and that have been prescribed to you. Take any new Medicines after you have completely understood and accpet all the possible adverse reactions/side effects.   Please note  You were cared for by a hospitalist during your hospital stay. If you have any questions about your discharge medications or the care you received while you were in the hospital after you are discharged, you can call the unit and asked to speak with the hospitalist on call if the hospitalist that took care of you is not available. Once you are discharged, your primary care physician will handle any further  medical issues. Please note that NO REFILLS for any discharge medications will be authorized once you are discharged, as it is imperative that you return to your primary care physician (or establish a relationship with a primary care physician if you do not have one) for your aftercare needs so that they can reassess your need for medications and monitor your lab values.     Today   NO acute events overnight. NO shortness of breath or any other complaints. Will d/c to SNF today.   VITAL SIGNS:  Blood pressure (!) 148/87, pulse 65, temperature 97.6 F (36.4 C), temperature source Oral, resp. rate 20, height 6\' 2"  (1.88 m), weight 132 kg (291 lb 1.6 oz), SpO2 97 %.  I/O:    Intake/Output Summary (Last 24 hours) at 02/10/2018 1234 Last data filed at 02/10/2018 1033 Gross per 24 hour  Intake 360 ml  Output 1150 ml  Net -790 ml    PHYSICAL EXAMINATION:   GENERAL:  72 y.o.-year-old obese patient lying in bed in no acute distress.  EYES: Pupils equal, round, reactive to light and accommodation. No scleral icterus. Extraocular muscles intact.  HEENT: Head atraumatic, normocephalic. Oropharynx and nasopharynx clear.  NECK:  Supple, no jugular venous distention. No thyroid enlargement, no tenderness.  LUNGS: Normal breath sounds bilaterally, no wheezing, rales, rhonchi. No use of accessory muscles of respiration.  CARDIOVASCULAR: S1, S2 normal. No murmurs, rubs, or gallops.  ABDOMEN: Soft, nontender, nondistended. Bowel sounds present. No organomegaly or mass.  EXTREMITIES: No cyanosis, clubbing, + 1 edema b/l.  Bruising on the right dorsum part of the foot near the toes, good capillary refill.   NEUROLOGIC: Cranial nerves II through XII are intact. No focal Motor or sensory deficits b/l. Globally weak.    PSYCHIATRIC: The patient is alert and oriented x 3.  SKIN: No obvious rash, lesion, or ulcer.    DATA REVIEW:   CBC Recent Labs  Lab 02/09/18 0553  WBC 6.7  HGB 13.2  HCT 38.3*   PLT 178    Chemistries  Recent Labs  Lab 02/07/18 1132 02/08/18 0259 02/09/18 0553  NA 142 140 139  K 3.3* 3.2* 3.3*  CL 99* 99* 96*  CO2 30 30 32  GLUCOSE 146* 207* 156*  BUN 28* 23* 28*  CREATININE 1.06 0.87 0.81  CALCIUM 8.6* 8.1* 8.2*  MG  --  1.9  --   AST 64*  --   --   ALT 75*  --   --   ALKPHOS 103  --   --   BILITOT 1.0  --   --     Cardiac Enzymes Recent Labs  Lab 02/07/18 1132  TROPONINI 0.04*    Microbiology Results  Results for orders placed or performed during the hospital encounter of 02/07/18  Culture, blood (routine x 2)     Status: None (Preliminary result)   Collection Time: 02/07/18  3:20 PM  Result Value Ref Range Status   Specimen Description BLOOD RT [facility]  Final   Special Requests   Final    BOTTLES DRAWN AEROBIC AND ANAEROBIC Blood Culture adequate volume   Culture   Final    NO GROWTH 3 DAYS Performed at [facility], [address], [place], [place] [address]    Report Status PENDING  Incomplete  Culture, blood (routine x 2)     Status: None (Preliminary result)   Collection Time: 02/07/18  3:20 PM  Result Value Ref Range Status   Specimen Description BLOOD LT [facility]  Final   Special Requests   Final    BOTTLES DRAWN AEROBIC AND ANAEROBIC Blood Culture adequate volume   Culture   Final    NO GROWTH 3 DAYS Performed at [facility], [address], [place],  [address]    Report Status PENDING  Incomplete    RADIOLOGY:  No results found.    Management plans discussed with the patient, family and they are in agreement.  CODE STATUS:     Code Status Orders  (From admission, onward)        Start     Ordered   02/07/18 1418  Full code  Continuous     02/07/18 1419    Code Status History    This patient has a current code status but no historical code status.    Advance Directive Documentation     Most Recent Value  Type of Advance Directive  Living will  Pre-existing out of  facility DNR order  (yellow form or pink MOST form)  -  "MOST" Form in Place?  -      TOTAL TIME TAKING CARE OF THIS PATIENT: 40 minutes.    Henreitta Leber M.D on 02/10/2018 at 12:34 PM  Between 7am to 6pm - Pager - [phone]  After 6pm go to [email or web address] - Proofreader  [facility]  Office  [phone]  CC: Primary care physician; Idelle Crouch, MD

## 2018-02-11 ENCOUNTER — Other Ambulatory Visit: Payer: Self-pay

## 2018-02-11 ENCOUNTER — Non-Acute Institutional Stay (SKILLED_NURSING_FACILITY): Payer: Medicare Other | Admitting: Gerontology

## 2018-02-11 DIAGNOSIS — J441 Chronic obstructive pulmonary disease with (acute) exacerbation: Secondary | ICD-10-CM | POA: Insufficient documentation

## 2018-02-11 DIAGNOSIS — B351 Tinea unguium: Secondary | ICD-10-CM | POA: Diagnosis not present

## 2018-02-11 DIAGNOSIS — R682 Dry mouth, unspecified: Secondary | ICD-10-CM

## 2018-02-11 DIAGNOSIS — R296 Repeated falls: Secondary | ICD-10-CM | POA: Insufficient documentation

## 2018-02-11 DIAGNOSIS — S9031XA Contusion of right foot, initial encounter: Secondary | ICD-10-CM | POA: Diagnosis not present

## 2018-02-11 DIAGNOSIS — J449 Chronic obstructive pulmonary disease, unspecified: Secondary | ICD-10-CM | POA: Insufficient documentation

## 2018-02-11 DIAGNOSIS — M79609 Pain in unspecified limb: Secondary | ICD-10-CM | POA: Insufficient documentation

## 2018-02-11 DIAGNOSIS — K117 Disturbances of salivary secretion: Secondary | ICD-10-CM | POA: Insufficient documentation

## 2018-02-11 MED ORDER — TRAMADOL HCL 50 MG PO TABS: 50.0000 mg | ORAL_TABLET | ORAL | 0 refills | Status: DC | PRN

## 2018-02-11 NOTE — Assessment & Plan Note (Signed)
Patient reports pain around his toes and toenails is so severe, he cannot stand for the sheets on the bed to touch his toes.  He was grimacing and flinching with light touch of the toes.  He is concerned about possible ingrown toenails.  Reports the Tylenol does not help.

## 2018-02-11 NOTE — Assessment & Plan Note (Signed)
Patient is here for PT and OT due to generalized weakness and deconditioning with frequent falls.  Patient has not been able to work with therapy yet.

## 2018-02-11 NOTE — Assessment & Plan Note (Signed)
Patient has area of ecchymosis on the right great toe and in a band-like pattern across the forefoot at the MTP joints.  Patient reports the bruising itself is not painful, but the toes are painful.  Especially the great toe.  Patient denies memory of trauma to the foot.  However, he admits he has had multiple falls as of late and could potentially have hit his foot on some furniture or floor and not realize it or remember it.  The foot is slightly edematous.  He also reports the toes of the left foot are painful as well.  However, there is not the same level of visible bruising on the left foot as there is on the right.

## 2018-02-11 NOTE — Assessment & Plan Note (Signed)
Patient was admitted to the facility for rehab following hospitalization at [facility] for pneumonia with COPD exacerbation and generalized weakness with deconditioning.  Patient reports he has been having multiple falls recently at home.  While in the hospital, he has been on antibiotics and scheduled nebulizer treatments.  This morning, patient reports he is having increased shortness of breath and reports he feels like he cannot get enough air.  His O2 saturations are 96% on 2 L O2.  However, he is obviously having increased work of breathing.  Bilateral upper lobes with faint wheezing and "tight" sounding.  Bilateral lower lobes diminished.  Patient is on his home dose of Symbicort inhaler.  However, he has not had any breathing treatments yet today.  He says the feeling of dyspnea just started not long ago.  Patient denies chest pain.  Denies cough or congestion at this point.  He is on a prednisone taper and p.o. Levaquin.

## 2018-02-11 NOTE — Progress Notes (Signed)
Location:      Place of Service:  Nursing [phone]) Provider:  Toni Arthurs, NP-C  Sparks, Leonie Douglas, MD  Patient Care Team: Idelle Crouch, MD as PCP - General (Unknown Physician Specialty)  Extended Emergency Contact Information Primary Emergency Contact: [name],[name]  [place] Phone: [phone] Relation: Sister  Code Status: DNR Goals of care: Advanced Directive information Advanced Directives 02/07/2018  Does Patient Have a Medical Advance Directive? Yes  Type of Advance Directive Living will  Does patient want to make changes to medical advance directive? No - Patient declined  Would patient like information on creating a medical advance directive? -  Pre-existing out of facility DNR order (yellow form or pink MOST form) -     Chief Complaint  Patient presents with  . Acute Visit    HPI:  Pt is a 72 y.o. [name] seen today for an acute visit for   COPD exacerbation ([place]) Patient was admitted to the facility for rehab following hospitalization at [facility] for pneumonia with COPD exacerbation and generalized weakness with deconditioning.  Patient reports he has been having multiple falls recently at home.  While in the hospital, he has been on antibiotics and scheduled nebulizer treatments.  This morning, patient reports he is having increased shortness of breath and reports he feels like he cannot get enough air.  His O2 saturations are 96% on 2 L O2.  However, he is obviously having increased work of breathing.  Bilateral upper lobes with faint wheezing and "tight" sounding.  Bilateral lower lobes diminished.  Patient is on his home dose of Symbicort inhaler.  However, he has not had any breathing treatments yet today.  He says the feeling of dyspnea just started not long ago.  Patient denies chest pain.  Denies cough or congestion at this point.  He is on a prednisone taper and p.o. Levaquin.  Xerostomia Patient complains of dry mouth.  This is not a  new finding.  He reports he uses Biotene at home.  Mouth does appear very dry with sticky secretions.  No obvious evidence of oral thrush.  Pain due to onychomycosis of nail Patient reports pain around his toes and toenails is so severe, he cannot stand for the sheets on the bed to touch his toes.  He was grimacing and flinching with light touch of the toes.  He is concerned about possible ingrown toenails.  Reports the Tylenol does not help.  Onychomycosis Patient has significant toenail fungus on all of the toes of both feet.  Bilateral great toes are the worst.  Thick, brittle, yellow, slightly malodorous nails.  Left great toenail is "loose", mobile and is likely to come off easily.  Left second toe with redness and evidence of bleeding around the nail bed and under the toenail.  Toenail appears darkened.  Peri-nail tissue red, mildly warm and tender to touch.  All of the nails are long and in need of trimming.  Patient states he has been trying to "get to the podiatrist to have his nails taken care of" but has not been able to due to worsening breathing pattern.  Patient admits that he is not able to reach his feet for hygiene/cleaning or application of medications/creams.  Patient seemed very grateful when I told him I would try to have our staff cut his nails while here.  Skin of the feet is dry and flaky.  Bilateral pulses present, but left pedal pulse is stronger than the right.  All toes are warm and cap refills WNL.  Traumatic ecchymosis of foot, right, initial encounter Patient has area of ecchymosis on the right great toe and in a band-like pattern across the forefoot at the MTP joints.  Patient reports the bruising itself is not painful, but the toes are painful.  Especially the great toe.  Patient denies memory of trauma to the foot.  However, he admits he has had multiple falls as of late and could potentially have hit his foot on some furniture or floor and not realize it or remember it.   The foot is slightly edematous.  He also reports the toes of the left foot are painful as well.  However, there is not the same level of visible bruising on the left foot as there is on the right.  Frequent falls Patient is here for PT and OT due to generalized weakness and deconditioning with frequent falls.  Patient has not been able to work with therapy yet.  Past Medical History:  Diagnosis Date  . Anxiety   . Cancer [facility])    prostate cancer   . COPD (chronic obstructive pulmonary disease) ([place])   . GI bleed    due to diverticulitis   . Heart murmur    hx of 1960s  . Hyperlipidemia   . Hypertension   . Hypothyroidism   . Sleep apnea    CPAP , report on chart , not used in last 2 weeks    Past Surgical History:  Procedure Laterality Date  . BACK SURGERY     lumbar 1987   . CERVICAL DISC SURGERY    . COLONOSCOPY WITH PROPOFOL N/A 07/06/2015   Procedure: COLONOSCOPY WITH PROPOFOL;  Surgeon: Manya Silvas, MD;  Location: [facility] ENDOSCOPY;  Service: Endoscopy;  Laterality: N/A;  . HERNIA REPAIR     umbilical hernia repair   . OTHER SURGICAL HISTORY     right rotator cuff surgery   . OTHER SURGICAL HISTORY     bilateral tubes in ears   . ROBOT ASSISTED LAPAROSCOPIC RADICAL PROSTATECTOMY  12/15/2011   Procedure: ROBOTIC ASSISTED LAPAROSCOPIC RADICAL PROSTATECTOMY LEVEL 2;  Surgeon: Dutch Gray, MD;  Location: [facility] ORS;  Service: Urology;  Laterality: N/A;      . THYROIDECTOMY    . TONSILLECTOMY      No Known Allergies  Allergies as of 02/11/2018   No Known Allergies     Medication List        Accurate as of 02/11/18  1:58 PM. Always use your most recent med list.          aspirin EC 81 MG tablet Take 81 mg by mouth daily.   budesonide-formoterol 160-4.5 MCG/ACT inhaler Commonly known as:  SYMBICORT Inhale 2 puffs into the lungs 2 (two) times daily.   diazepam 5 MG tablet Commonly known as:  VALIUM Take 5 mg by mouth every 6 (six) hours as needed. Muscle spasm    furosemide 40 MG tablet Commonly known as:  LASIX Take 40 mg by mouth daily.   ibuprofen 400 MG tablet Commonly known as:  ADVIL,MOTRIN Take 400 mg by mouth every 6 (six) hours as needed.   ipratropium-albuterol 0.5-2.5 (3) MG/3ML Soln Commonly known as:  DUONEB Take 3 mLs by nebulization every 6 (six) hours as needed (Shortness of breath, wheezing.).   levofloxacin 750 MG tablet Commonly known as:  LEVAQUIN Take 750 mg by mouth daily.   levothyroxine 200 MCG tablet Commonly known as:  SYNTHROID, LEVOTHROID  Take 200 mcg by mouth daily before breakfast.   losartan-hydrochlorothiazide 50-12.5 MG tablet Commonly known as:  HYZAAR Take 1 tablet by mouth daily before breakfast.   simvastatin 10 MG tablet Commonly known as:  ZOCOR Take 10 mg by mouth daily.   traMADol 50 MG tablet Commonly known as:  ULTRAM Take 1 tablet (50 mg total) by mouth every 4 (four) hours as needed.   venlafaxine XR 75 MG 24 hr capsule Commonly known as:  EFFEXOR-XR Take 150 mg by mouth daily. 2 capsules   VENTOLIN HFA 108 (90 Base) MCG/ACT inhaler Generic drug:  albuterol Inhale 2 puffs into the lungs every 6 (six) hours as needed for wheezing or shortness of breath.   verapamil 180 MG CR tablet Commonly known as:  CALAN-SR Take 180 mg by mouth daily before breakfast.   zolpidem 10 MG tablet Commonly known as:  AMBIEN Take 10 mg by mouth at bedtime as needed for sleep.       Review of Systems  Constitutional: Positive for fatigue. Negative for activity change, appetite change, chills, diaphoresis and fever.  HENT: Negative for congestion, mouth sores, nosebleeds, postnasal drip, sneezing, sore throat, trouble swallowing and voice change.   Respiratory: Positive for shortness of breath and wheezing. Negative for apnea, cough, choking and chest tightness.   Cardiovascular: Negative for chest pain, palpitations and leg swelling.  Gastrointestinal: Negative for abdominal distention, abdominal  pain, constipation, diarrhea and nausea.  Genitourinary: Negative for difficulty urinating, dysuria, frequency and urgency.  Musculoskeletal: Positive for arthralgias (typical arthritis). Negative for back pain, gait problem and myalgias.  Skin: Negative for color change, pallor, rash and wound.  Neurological: Positive for weakness. Negative for dizziness, tremors, syncope, speech difficulty, numbness and headaches.  Psychiatric/Behavioral: Negative for agitation and behavioral problems.  All other systems reviewed and are negative.    There is no immunization history on file for this patient. Pertinent  Health Maintenance Due  Topic Date Due  . PNA vac Low Risk Adult (1 of 2 - PCV13) 07/23/2009  . INFLUENZA VACCINE  06/17/2017  . COLONOSCOPY  07/05/2025   No flowsheet data found. Functional Status Survey:    Vitals:   02/11/18 0515  BP: (!) 157/97  Pulse: 61  Resp: 20  Temp: 97.7 F (36.5 C)  SpO2: 95%   There is no height or weight on file to calculate BMI. Physical Exam  Constitutional: He is oriented to person, place, and time. Vital signs are normal. He appears well-developed and well-nourished. He is active and cooperative. He does not appear ill. No distress. Nasal cannula in place.  HENT:  Head: Normocephalic and atraumatic.  Mouth/Throat: Uvula is midline, oropharynx is clear and moist and mucous membranes are normal. Mucous membranes are not pale, not dry and not cyanotic.  Eyes: Pupils are equal, round, and reactive to light. Conjunctivae, EOM and lids are normal.  Neck: Trachea normal, normal range of motion and full passive range of motion without pain. Neck supple. No JVD present. No tracheal deviation, no edema and no erythema present. No thyromegaly present.  Cardiovascular: Normal rate, normal heart sounds, intact distal pulses and normal pulses. An irregular rhythm present. Exam reveals no gallop, no distant heart sounds and no friction rub.  No murmur  heard. Pulses:      Dorsalis pedis pulses are 2+ on the right side, and 2+ on the left side.  Mild pedal edema  Pulmonary/Chest: Accessory muscle usage present. No respiratory distress. He has decreased breath sounds  in the right lower field and the left lower field. He has wheezes in the right upper field and the left upper field. He has no rhonchi. He has no rales. He exhibits no tenderness.  Abdominal: Soft. Normal appearance and bowel sounds are normal. He exhibits no distension and no ascites. There is no tenderness.  Musculoskeletal: Normal range of motion. He exhibits no edema or tenderness.  Expected osteoarthritis, stiffness; Bilateral Calves soft, supple. Negative Homan's Sign. B- pedal pulses equal; generalized weakness; bilateral feet/toes painful  Neurological: He is alert and oriented to person, place, and time. He has normal strength. A sensory deficit is present. Coordination and gait abnormal.  Skin: Skin is warm, dry and intact. Ecchymosis (right foot) noted. He is not diaphoretic. No cyanosis. No pallor. Nails show no clubbing.  Dry, flaky feet.  Long toenails with onychomycosis  Psychiatric: His speech is normal and behavior is normal. Judgment and thought content normal. His mood appears anxious. Cognition and memory are normal. He exhibits a depressed mood.  Nursing note and vitals reviewed.   Labs reviewed: Recent Labs    02/07/18 1132 02/08/18 0259 02/09/18 0553  NA 142 140 139  K 3.3* 3.2* 3.3*  CL 99* 99* 96*  CO2 30 30 [name]  GLUCOSE 146* 207* 156*  BUN 28* 23* 28*  CREATININE 1.06 0.87 0.81  CALCIUM 8.6* 8.1* 8.2*  MG  --  1.9  --    Recent Labs    02/07/18 1132  AST 64*  ALT 75*  ALKPHOS 103  BILITOT 1.0  PROT 7.4  ALBUMIN 3.4*   Recent Labs    02/07/18 1132 02/08/18 0259 02/09/18 0553  WBC 8.4 6.9 6.7  NEUTROABS 7.1*  --   --   HGB 13.8 12.9* 13.2  HCT 39.7* 38.5* 38.3*  MCV 92.5 93.4 92.0  PLT 154 167 178   No results found for: TSH No  results found for: HGBA1C No results found for: CHOL, HDL, LDLCALC, LDLDIRECT, TRIG, CHOLHDL  Significant Diagnostic Results in last 30 days:  Dg Chest Portable 1 View  Result Date: 02/07/2018 CLINICAL DATA:  Increasing shortness of breath.  Cough. EXAM: PORTABLE CHEST 1 VIEW COMPARISON:  December 11, 2011 FINDINGS: Mild opacity in the left base. Mild opacity in the right base is stable and may represent vascular crowding. Stable cardiomegaly. No other interval changes or acute abnormalities. IMPRESSION: New opacity in the left base.  Recommend follow-up to resolution. Electronically Signed   By: Dorise Bullion III M.D   On: 02/07/2018 12:48    Assessment/Plan Devaughn was seen today for acute visit.  Diagnoses and all orders for this visit:  COPD exacerbation ([place])  Onychomycosis  Pain due to onychomycosis of nail  Traumatic ecchymosis of foot, right, initial encounter  Xerostomia  Frequent falls   Continue working with PT/OT  Continue exercises as taught by PT/OT  Weightbearing as tolerable on the feet for now  X-rays of bilateral feet to rule out fractures  Continue Symbicort 160-4 0.5 mcg inhaler 2 puffs twice daily  Continue duo nebs every 6 hours as needed  Schedule duo nebs 3 mL 3 times daily  Scheduled Tylenol 650 mg p.o. 3 times daily for foot pain  Ibuprofen 400 mg p.o. every 6 hours as needed foot pain  Tramadol 50 mg p.o. every 4 hours as needed foot pain  Continue Levaquin 750 mg p.o. daily times 5 days  Continue O2 2 L nasal cannula continuous  Biotene dry mouth rinse -  rinse/swish and spit 30 mL twice daily  Biotene moisturizing mouth spray-2 sprays on the tongue every 4 hours as needed for mouth moisture.  Okay to leave at bedside  Itraconazole 100 mg - 2 capsules (200 mg) p.o. daily times 6 weeks-start after prednisone taper complete due to potential interaction  Dakin's solution 0.5%-use enough solution to cover the toes, soak feet in the basin  with solution for 20 minutes nightly.  Dry feet well.  Then apply cream  Vicks VapoRub-apply liberal amount to all toenails nightly  DC simvastatin  Change to pravastatin 10 mg p.o. daily-continue pravastatin while on the itraconazole (due to interaction with simvastatin)  Facility DON to cut patient's toenails, if possible, next week  If she is not able to cut the nails, refer to podiatry  Labs in the morning  Obtain bed cradle to keep the covers off of patient's toes  Assist with ADLs and mobility as appropriate  Encourage patient to call for assistance  Family/ staff Communication:   Total Time: 55 minutes  Documentation: 20 minutes  Face to Face: 35 minutes  Family/Phone:   Labs/tests ordered: CBC, met C, x-rays of bilateral feet  Medication list reviewed and assessed for continued appropriateness.  Vikki Ports, NP-C Geriatrics [facility] [phone], [place] [address] Cell Phone (Mon-Fri 8am-5pm):  [phone] On Call:  [phone] & follow prompts after 5pm & weekends Office Phone:  [phone] Office Fax:  [phone]

## 2018-02-11 NOTE — Assessment & Plan Note (Signed)
Patient has significant toenail fungus on all of the toes of both feet.  Bilateral great toes are the worst.  Thick, brittle, yellow, slightly malodorous nails.  Left great toenail is "loose", mobile and is likely to come off easily.  Left second toe with redness and evidence of bleeding around the nail bed and under the toenail.  Toenail appears darkened.  Peri-nail tissue red, mildly warm and tender to touch.  All of the nails are long and in need of trimming.  Patient states he has been trying to "get to the podiatrist to have his nails taken care of" but has not been able to due to worsening breathing pattern.  Patient admits that he is not able to reach his feet for hygiene/cleaning or application of medications/creams.  Patient seemed very grateful when I told him I would try to have our staff cut his nails while here.  Skin of the feet is dry and flaky.  Bilateral pulses present, but left pedal pulse is stronger than the right.  All toes are warm and cap refills WNL.

## 2018-02-11 NOTE — Telephone Encounter (Signed)
Rx sent to [facility] phone : [phone] , fax : [phone]  

## 2018-02-11 NOTE — Assessment & Plan Note (Signed)
Patient complains of dry mouth.  This is not a new finding.  He reports he uses Biotene at home.  Mouth does appear very dry with sticky secretions.  No obvious evidence of oral thrush.

## 2018-02-12 ENCOUNTER — Other Ambulatory Visit
Admission: RE | Admit: 2018-02-12 | Discharge: 2018-02-12 | Disposition: A | Discharge status: Home / Self Care | Payer: No Typology Code available for payment source | Source: Ambulatory Visit | Attending: Gerontology | Admitting: Gerontology

## 2018-02-12 DIAGNOSIS — J440 Chronic obstructive pulmonary disease with acute lower respiratory infection: Secondary | ICD-10-CM | POA: Insufficient documentation

## 2018-02-12 LAB — COMPREHENSIVE METABOLIC PANEL
ALBUMIN: 3.3 g/dL — AB (ref 3.5–5.0)
ALK PHOS: 69 U/L (ref 38–126)
ALT: 88 U/L — AB (ref 17–63)
AST: 56 U/L — AB (ref 15–41)
Anion gap: 12 (ref 5–15)
BILIRUBIN TOTAL: 1.1 mg/dL (ref 0.3–1.2)
BUN: 29 mg/dL — AB (ref 6–20)
CO2: 31 mmol/L (ref 22–32)
CREATININE: 0.98 mg/dL (ref 0.61–1.24)
Calcium: 8.7 mg/dL — ABNORMAL LOW (ref 8.9–10.3)
Chloride: 94 mmol/L — ABNORMAL LOW (ref 101–111)
GFR calc Af Amer: >60 mL/min (ref >60)
GFR calc non Af Amer: >60 mL/min (ref >60)
GLUCOSE: 131 mg/dL — AB (ref 65–99)
Potassium: 2.6 mmol/L — CL (ref 3.5–5.1)
Sodium: 137 mmol/L (ref 135–145)
Total Protein: 6.6 g/dL (ref 6.5–8.1)

## 2018-02-12 LAB — CBC WITH DIFFERENTIAL/PLATELET
BASOS ABS: 0 10*3/uL (ref 0–0.1)
BLASTS: 0 %
Band Neutrophils: 1 %
Basophils Relative: 0 %
Eosinophils Absolute: 0 10*3/uL (ref 0–0.7)
Eosinophils Relative: 0 %
HEMATOCRIT: 40.7 % (ref 40.0–52.0)
HEMOGLOBIN: 14.1 g/dL (ref 13.0–18.0)
Lymphocytes Relative: 32 %
Lymphs Abs: 2.2 10*3/uL (ref 1.0–3.6)
MCH: 31.7 pg (ref 26.0–34.0)
MCHC: 34.6 g/dL (ref 32.0–36.0)
MCV: 91.7 fL (ref 80.0–100.0)
METAMYELOCYTES PCT: 1 %
MONOS PCT: 13 %
Monocytes Absolute: 0.9 10*3/uL (ref 0.2–1.0)
Myelocytes: 1 %
NEUTROS ABS: 3.9 10*3/uL (ref 1.4–6.5)
Neutrophils Relative %: 52 %
Other: 0 %
PROMYELOCYTES ABS: 0 %
Platelets: 273 10*3/uL (ref 150–440)
RBC: 4.44 MIL/uL (ref 4.40–5.90)
RDW: 13.2 % (ref 11.5–14.5)
WBC: 7 10*3/uL (ref 3.8–10.6)
nRBC: 0 /100 WBC

## 2018-02-12 LAB — CULTURE, BLOOD (ROUTINE X 2)
Culture: NO GROWTH
Culture: NO GROWTH
SPECIAL REQUESTS: ADEQUATE
SPECIAL REQUESTS: ADEQUATE

## 2018-02-13 ENCOUNTER — Other Ambulatory Visit
Admission: RE | Admit: 2018-02-13 | Discharge: 2018-02-13 | Disposition: A | Discharge status: Home / Self Care | Payer: No Typology Code available for payment source | Source: Skilled Nursing Facility | Attending: Internal Medicine | Admitting: Internal Medicine

## 2018-02-13 DIAGNOSIS — J440 Chronic obstructive pulmonary disease with acute lower respiratory infection: Secondary | ICD-10-CM | POA: Insufficient documentation

## 2018-02-13 LAB — COMPREHENSIVE METABOLIC PANEL
ALK PHOS: 67 U/L (ref 38–126)
ALT: 89 U/L — ABNORMAL HIGH (ref 17–63)
ANION GAP: 11 (ref 5–15)
AST: 45 U/L — ABNORMAL HIGH (ref 15–41)
Albumin: 3.4 g/dL — ABNORMAL LOW (ref 3.5–5.0)
BILIRUBIN TOTAL: 1 mg/dL (ref 0.3–1.2)
BUN: 30 mg/dL — ABNORMAL HIGH (ref 6–20)
CALCIUM: 8.8 mg/dL — AB (ref 8.9–10.3)
CO2: 29 mmol/L (ref 22–32)
Chloride: 98 mmol/L — ABNORMAL LOW (ref 101–111)
Creatinine, Ser: 0.88 mg/dL (ref 0.61–1.24)
Glucose, Bld: 135 mg/dL — ABNORMAL HIGH (ref 65–99)
Potassium: 3.1 mmol/L — ABNORMAL LOW (ref 3.5–5.1)
Sodium: 138 mmol/L (ref 135–145)
TOTAL PROTEIN: 6.7 g/dL (ref 6.5–8.1)

## 2018-02-13 LAB — CBC WITH DIFFERENTIAL/PLATELET
BASOS PCT: 0 %
Basophils Absolute: 0 10*3/uL (ref 0–0.1)
EOS ABS: 0 10*3/uL (ref 0–0.7)
Eosinophils Relative: 0 %
HEMATOCRIT: 41 % (ref 40.0–52.0)
HEMOGLOBIN: 14.1 g/dL (ref 13.0–18.0)
LYMPHS PCT: 11 %
Lymphs Abs: 0.9 10*3/uL — ABNORMAL LOW (ref 1.0–3.6)
MCH: 32.1 pg (ref 26.0–34.0)
MCHC: 34.4 g/dL (ref 32.0–36.0)
MCV: 93.3 fL (ref 80.0–100.0)
MONOS PCT: 14 %
Monocytes Absolute: 1.1 10*3/uL — ABNORMAL HIGH (ref 0.2–1.0)
NEUTROS ABS: 6 10*3/uL (ref 1.4–6.5)
Neutrophils Relative %: 75 %
Platelets: 308 10*3/uL (ref 150–440)
RBC: 4.39 MIL/uL — ABNORMAL LOW (ref 4.40–5.90)
RDW: 13.4 % (ref 11.5–14.5)
WBC: 8 10*3/uL (ref 3.8–10.6)

## 2018-02-15 ENCOUNTER — Encounter
Admission: RE | Admit: 2018-02-15 | Discharge: 2018-02-15 | Disposition: A | Discharge status: Home / Self Care | Payer: Medicare Other | Source: Ambulatory Visit | Attending: Internal Medicine | Admitting: Internal Medicine

## 2018-02-17 ENCOUNTER — Other Ambulatory Visit: Payer: Self-pay

## 2018-02-17 MED ORDER — HYDROCODONE-ACETAMINOPHEN 5-325 MG PO TABS: 1.0000 | ORAL_TABLET | Freq: Four times a day (QID) | ORAL | 0 refills | Status: DC | PRN

## 2018-02-17 NOTE — Telephone Encounter (Signed)
Rx sent to [facility] phone : [phone] , fax : [phone]  

## 2018-02-24 ENCOUNTER — Encounter: Payer: Self-pay | Admitting: Gerontology

## 2018-02-24 NOTE — Progress Notes (Signed)
This encounter was created in error - please disregard.

## 2018-02-24 NOTE — Progress Notes (Signed)
error 

## 2018-02-25 ENCOUNTER — Other Ambulatory Visit
Admission: RE | Admit: 2018-02-25 | Discharge: 2018-02-25 | Disposition: A | Discharge status: Home / Self Care | Payer: No Typology Code available for payment source | Source: Ambulatory Visit | Attending: Gerontology | Admitting: Gerontology

## 2018-02-25 ENCOUNTER — Non-Acute Institutional Stay (SKILLED_NURSING_FACILITY): Payer: Medicare Other | Admitting: Gerontology

## 2018-02-25 DIAGNOSIS — J441 Chronic obstructive pulmonary disease with (acute) exacerbation: Secondary | ICD-10-CM

## 2018-02-25 DIAGNOSIS — G4700 Insomnia, unspecified: Secondary | ICD-10-CM | POA: Insufficient documentation

## 2018-02-25 DIAGNOSIS — F322 Major depressive disorder, single episode, severe without psychotic features: Secondary | ICD-10-CM

## 2018-02-25 DIAGNOSIS — E538 Deficiency of other specified B group vitamins: Secondary | ICD-10-CM | POA: Diagnosis not present

## 2018-02-25 DIAGNOSIS — E559 Vitamin D deficiency, unspecified: Secondary | ICD-10-CM | POA: Insufficient documentation

## 2018-02-25 DIAGNOSIS — J449 Chronic obstructive pulmonary disease, unspecified: Secondary | ICD-10-CM | POA: Insufficient documentation

## 2018-02-25 DIAGNOSIS — F329 Major depressive disorder, single episode, unspecified: Secondary | ICD-10-CM | POA: Insufficient documentation

## 2018-02-25 DIAGNOSIS — R39198 Other difficulties with micturition: Secondary | ICD-10-CM

## 2018-02-25 LAB — MAGNESIUM: Magnesium: 1.9 mg/dL (ref 1.7–2.4)

## 2018-02-25 LAB — COMPREHENSIVE METABOLIC PANEL
ALBUMIN: 3.6 g/dL (ref 3.5–5.0)
ALK PHOS: 67 U/L (ref 38–126)
ALT: 36 U/L (ref 17–63)
ANION GAP: 10 (ref 5–15)
AST: 23 U/L (ref 15–41)
BUN: 26 mg/dL — ABNORMAL HIGH (ref 6–20)
CO2: 28 mmol/L (ref 22–32)
Calcium: 9.2 mg/dL (ref 8.9–10.3)
Chloride: 100 mmol/L — ABNORMAL LOW (ref 101–111)
Creatinine, Ser: 1.08 mg/dL (ref 0.61–1.24)
GFR calc Af Amer: >60 mL/min (ref >60)
GFR calc non Af Amer: >60 mL/min (ref >60)
GLUCOSE: 160 mg/dL — AB (ref 65–99)
POTASSIUM: 3.4 mmol/L — AB (ref 3.5–5.1)
Sodium: 138 mmol/L (ref 135–145)
Total Bilirubin: 0.7 mg/dL (ref 0.3–1.2)
Total Protein: 6.8 g/dL (ref 6.5–8.1)

## 2018-02-25 LAB — CBC WITH DIFFERENTIAL/PLATELET
BASOS PCT: 0 %
Basophils Absolute: 0 10*3/uL (ref 0–0.1)
Eosinophils Absolute: 0 10*3/uL (ref 0–0.7)
Eosinophils Relative: 0 %
HEMATOCRIT: 38.1 % — AB (ref 40.0–52.0)
Hemoglobin: 12.9 g/dL — ABNORMAL LOW (ref 13.0–18.0)
Lymphocytes Relative: 8 %
Lymphs Abs: 0.8 10*3/uL — ABNORMAL LOW (ref 1.0–3.6)
MCH: 31.7 pg (ref 26.0–34.0)
MCHC: 33.8 g/dL (ref 32.0–36.0)
MCV: 94 fL (ref 80.0–100.0)
MONO ABS: 0.8 10*3/uL (ref 0.2–1.0)
Monocytes Relative: 7 %
NEUTROS ABS: 9.3 10*3/uL — AB (ref 1.4–6.5)
Neutrophils Relative %: 85 %
PLATELETS: 274 10*3/uL (ref 150–440)
RBC: 4.05 MIL/uL — AB (ref 4.40–5.90)
RDW: 13.3 % (ref 11.5–14.5)
WBC: 11 10*3/uL — AB (ref 3.8–10.6)

## 2018-02-25 LAB — VITAMIN B12: Vitamin B-12: 239 pg/mL (ref 180–914)

## 2018-02-26 LAB — VITAMIN D 25 HYDROXY (VIT D DEFICIENCY, FRACTURES): VIT D 25 HYDROXY: 9.8 ng/mL — AB (ref 30.0–100.0)

## 2018-03-01 DIAGNOSIS — E559 Vitamin D deficiency, unspecified: Secondary | ICD-10-CM | POA: Insufficient documentation

## 2018-03-01 DIAGNOSIS — R39198 Other difficulties with micturition: Secondary | ICD-10-CM | POA: Insufficient documentation

## 2018-03-01 DIAGNOSIS — G4700 Insomnia, unspecified: Secondary | ICD-10-CM | POA: Insufficient documentation

## 2018-03-01 DIAGNOSIS — E538 Deficiency of other specified B group vitamins: Secondary | ICD-10-CM | POA: Insufficient documentation

## 2018-03-01 DIAGNOSIS — F329 Major depressive disorder, single episode, unspecified: Secondary | ICD-10-CM | POA: Insufficient documentation

## 2018-03-01 NOTE — Progress Notes (Signed)
Location:      Place of Service:    Provider:  Toni Arthurs, NP-C  Doy Hutching Leonie Douglas, MD  Patient Care Team: Idelle Crouch, MD as PCP - General (Unknown Physician Specialty)  Extended Emergency Contact Information Primary Emergency Contact: [name],[name]  [place] Phone: [phone] Relation: Sister  Code Status:  DNR Goals of care: Advanced Directive information Advanced Directives 02/24/2018  Does Patient Have a Medical Advance Directive? Yes  Type of Advance Directive Out of facility DNR (pink MOST or yellow form)  Does patient want to make changes to medical advance directive? No - Patient declined  Would patient like information on creating a medical advance directive? -  Pre-existing out of facility DNR order (yellow form or pink MOST form) -     No chief complaint on file.   HPI:  Pt is a 72 y.o. male seen today for medical management of chronic diseases. Pt reports worsening symptoms of depression. Pt reports his partner of 45 years passed away a few months ago. He reports having significant difficulty staying alone in their house. He is frequently tearful and having significant difficulty dealing with his death. Pt agreeable to increasing anti-depressant. Pt also agreeable to receiving services from a counselor. Pt reports his pain is improved. He is able to wok with therapy now. Minimal dyspnea with exertion. Increased depression could also be linked to vitamin B12 and vitamin D deficiences. Pt reported increased insomnia. Not receiving his usual home dose of ambien while in the facility. Pt also reports he is having increased difficulty sarting urine flow. At times, the flow is delayed several minutes before he can urinate. He has a h/o prostat cancer with complete TURP. Voiding  Regular amount. BSS. VSS. No other complaints.      Past Medical History:  Diagnosis Date  . Anxiety   . Anxiety disorder 05/05/2014   unspecifed  . Cancer  [facility])    prostate cancer   . Colonic polyp   . COPD (chronic obstructive pulmonary disease) ([place])   . Depression   . Elevated prostate specific antigen (PSA)   . Essential (primary) hypertension 05/05/2014  . 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 GI bleed    due to diverticulitis   . Heart murmur    hx of 1960s  . History of thyroid cancer 05/05/2014  . Hypercholesteremia   . Hyperlipidemia   . Hypertension   . Hypothyroidism 05/05/2014  . Impotence of organic origin   . Male erectile dysfunction 12/29/2012  . Malignant neoplasm of prostate ([place])    10/30/2011 T1c - Identified by needle biopsy . Gleason 7 (3+4) 5 of 12 cores, Bilateral   . Prediabetes 06/22/2017  . Prostate cancer ([place])   . Shortness of breath on exertion 07/11/2015  . Sleep apnea    CPAP , report on chart , not used in last 2 weeks   . Thyroid cancer ([place])   . Thyroid disease   . Urinary obstruction    not elsewhere classified   Past Surgical History:  Procedure Laterality Date  . BACK SURGERY     lumbar 1987   . BACK SURGERY     C-6- Plate&Pin  . CERVICAL DISC SURGERY    . COLONOSCOPY     10/06/2011, 06/05/2006, 05/06/2002 adematous polyps: CBF 09/2014; Recall Ltr mailed 02/27/2015 (dw)  . COLONOSCOPY WITH PROPOFOL N/A 07/06/2015   Procedure: COLONOSCOPY WITH PROPOFOL;  Surgeon: Manya Silvas, MD;  Location: [facility] ENDOSCOPY;  Service:  Endoscopy;  Laterality: N/A;  . HERNIA REPAIR     umbilical hernia repair   . LAMINECTOMY FOR EXCISION / EVACUATION INTRASPINAL LESION     lumbar  . OTHER SURGICAL HISTORY     right rotator cuff surgery   . OTHER SURGICAL HISTORY     bilateral tubes in ears   . POSTERIOR LAMINECTOMY / DECOMPRESSION CERVICAL SPINE    . PROSTATE BIOPSY     10/22/2011 Volume:55.8 cc's, PSA:4.5, Free PSA:12%  . ROBOT ASSISTED LAPAROSCOPIC RADICAL PROSTATECTOMY  12/15/2011   Procedure: ROBOTIC ASSISTED LAPAROSCOPIC RADICAL PROSTATECTOMY LEVEL 2;  Surgeon: Dutch Gray, MD;  Location: [facility]  ORS;  Service: Urology;  Laterality: N/A;      . ROTATOR CUFF REPAIR Right   . TONSILLECTOMY    . TOTAL THYROIDECTOMY      No Known Allergies  Allergies as of 02/25/2018   No Known Allergies     Medication List        Accurate as of 02/25/18 11:59 PM. Always use your most recent med list.          acetaminophen 325 MG tablet Commonly known as:  TYLENOL Take 650 mg by mouth 3 (three) times daily.   antiseptic oral rinse Liqd 30 mLs by Mouth Rinse route 2 (two) times daily. Rinse mouth and swish, spit   aspirin EC 81 MG tablet Take 81 mg by mouth daily.   budesonide-formoterol 160-4.5 MCG/ACT inhaler Commonly known as:  SYMBICORT Inhale 2 puffs into the lungs 2 (two) times daily.   diazepam 5 MG tablet Commonly known as:  VALIUM Take 5 mg by mouth every 6 (six) hours as needed. Muscle spasm   ENSURE ENLIVE PO Take 1 Bottle by mouth 2 (two) times daily between meals.   feeding supplement (PRO-STAT SUGAR FREE 64) Liqd Take 30 mLs by mouth 2 (two) times daily between meals.   furosemide 40 MG tablet Commonly known as:  LASIX Take 40 mg by mouth daily.   HYDROcodone-acetaminophen 5-325 MG tablet Commonly known as:  NORCO/VICODIN Take 1 tablet by mouth every 6 (six) hours as needed.   ibuprofen 400 MG tablet Commonly known as:  ADVIL,MOTRIN Take 400 mg by mouth every 6 (six) hours as needed.   ipratropium-albuterol 0.5-2.5 (3) MG/3ML Soln Commonly known as:  DUONEB Take 3 mLs by nebulization every 6 (six) hours as needed (Shortness of breath, wheezing.).   itraconazole 100 MG capsule Commonly known as:  SPORANOX Take 200 mg by mouth daily. 2 caps daily x 6 weeks for Onychomycosis of the toenails   levothyroxine 200 MCG tablet Commonly known as:  SYNTHROID, LEVOTHROID Take 200 mcg by mouth daily before breakfast.   losartan-hydrochlorothiazide 50-12.5 MG tablet Commonly known as:  HYZAAR Take 1 tablet by mouth daily before breakfast.   potassium  chloride SA 20 MEQ tablet Commonly known as:  K-DUR,KLOR-CON Take 20 mEq by mouth daily.   pravastatin 10 MG tablet Commonly known as:  PRAVACHOL Take 10 mg by mouth daily. HLD (Continue Pravastin while on Itraconazole)   sodium hypochlorite 0.5 % Soln Commonly known as:  DAKIN'S FULL STRENGTH Irrigate with 1 application as directed at bedtime. Use enough solution in a basin to cover the toes. Soak feet for 20 minutes. Dry feet well after soaking. Then apply cream for Onychomychosis of the toenails   venlafaxine XR 75 MG 24 hr capsule Commonly known as:  EFFEXOR-XR Take 150 mg by mouth daily. 2 capsules   VENTOLIN HFA 108 (90 Base)  MCG/ACT inhaler Generic drug:  albuterol Inhale 2 puffs into the lungs every 6 (six) hours as needed for wheezing or shortness of breath.   verapamil 180 MG CR tablet Commonly known as:  CALAN-SR Take 180 mg by mouth daily before breakfast.   VICKS VAPORUB 4.7-1.2-2.6 % Oint Apply liberal amount topically to toenails and skin surrounding the toenails at bedtime for Onychomychosis   zolpidem 10 MG tablet Commonly known as:  AMBIEN Take 10 mg by mouth at bedtime as needed for sleep.       Review of Systems  Constitutional: Negative for activity change, appetite change, chills, diaphoresis and fever.  HENT: Negative for congestion, mouth sores, nosebleeds, postnasal drip, sneezing, sore throat, trouble swallowing and voice change.   Respiratory: Negative for apnea, cough, choking, chest tightness, shortness of breath and wheezing.   Cardiovascular: Negative for chest pain, palpitations and leg swelling.  Gastrointestinal: Negative for abdominal distention, abdominal pain, constipation, diarrhea and nausea.  Genitourinary: Positive for dysuria. Negative for difficulty urinating, frequency and urgency.  Musculoskeletal: Positive for arthralgias (typical arthritis) and gait problem. Negative for back pain and myalgias.  Skin: Positive for color change,  pallor and wound. Negative for rash.  Neurological: Negative for dizziness, tremors, syncope, speech difficulty, weakness, numbness and headaches.  Psychiatric/Behavioral: Positive for dysphoric mood and sleep disturbance. Negative for agitation and behavioral problems.  All other systems reviewed and are negative.   Immunization History  Administered Date(s) Administered  . Influenza-Unspecified 09/22/2013, 10/02/2015, 10/03/2015, 09/04/2017, 09/18/2017  . Pneumococcal Conjugate-13 09/04/2017  . Pneumococcal Polysaccharide-23 07/03/2016   Pertinent  Health Maintenance Due  Topic Date Due  . INFLUENZA VACCINE  06/17/2018  . COLONOSCOPY  07/05/2025  . PNA vac Low Risk Adult  Completed   No flowsheet data found. Functional Status Survey:    There were no vitals filed for this visit. There is no height or weight on file to calculate BMI. Physical Exam  Constitutional: He is oriented to person, place, and time. Vital signs are normal. He appears well-developed and well-nourished. He is active and cooperative. He does not appear ill. No distress.  HENT:  Head: Normocephalic and atraumatic.  Mouth/Throat: Uvula is midline, oropharynx is clear and moist and mucous membranes are normal. Mucous membranes are not pale, not dry and not cyanotic.  Eyes: Pupils are equal, round, and reactive to light. Conjunctivae, EOM and lids are normal.  Neck: Trachea normal, normal range of motion and full passive range of motion without pain. Neck supple. No JVD present. No tracheal deviation, no edema and no erythema present. No thyromegaly present.  Cardiovascular: Normal rate, regular rhythm, normal heart sounds, intact distal pulses and normal pulses. Exam reveals no gallop, no distant heart sounds and no friction rub.  No murmur heard. Pulses:      Dorsalis pedis pulses are 2+ on the right side, and 2+ on the left side.  No edema  Pulmonary/Chest: Effort normal and breath sounds normal. No accessory  muscle usage. No respiratory distress. He has no decreased breath sounds. He has no wheezes. He has no rhonchi. He has no rales. He exhibits no tenderness.  Abdominal: Soft. Normal appearance and bowel sounds are normal. He exhibits no distension and no ascites. There is no tenderness.  Musculoskeletal: Normal range of motion. He exhibits no edema or tenderness.  Expected osteoarthritis, stiffness; Bilateral Calves soft, supple. Negative Homan's Sign. B- pedal pulses equal  Neurological: He is alert and oriented to person, place, and time. He has normal  strength.  Skin: Skin is warm, dry and intact. He is not diaphoretic. No cyanosis. No pallor. Nails show no clubbing.  Psychiatric: His speech is normal. Thought content normal. He is slowed. Cognition and memory are normal. He expresses impulsivity. He exhibits a depressed mood.  Nursing note and vitals reviewed.   Labs reviewed: Recent Labs    02/08/18 0259  02/12/18 0538 02/13/18 0450 02/25/18 1630  NA 140   < > 137 138 138  K 3.2*   < > 2.6* 3.1* 3.4*  CL 99*   < > 94* 98* 100*  CO2 30   < > _0 GLUCOSE 207*   < > 131* 135* 160*  BUN 23*   < > 29* 30* 26*  CREATININE 0.87   < > 0.98 0.88 1.08  CALCIUM 8.1*   < > 8.7* 8.8* 9.2  MG 1.9  --   --   --  1.9   < > = values in this interval not displayed.   Recent Labs    02/12/18 0538 02/13/18 0450 02/25/18 1630  AST 56* 45* 23  ALT 88* 89* 36  ALKPHOS 69 67 67  BILITOT 1.1 1.0 0.7  PROT 6.6 6.7 6.8  ALBUMIN 3.3* 3.4* 3.6   Recent Labs    02/12/18 0538 02/13/18 0450 02/25/18 1630  WBC 7.0 8.0 11.0*  NEUTROABS 3.9 6.0 9.3*  HGB 14.1 14.1 12.9*  HCT 40.7 41.0 38.1*  MCV 91.7 93.3 94.0  PLT 273 308 274   No results found for: TSH No results found for: HGBA1C No results found for: CHOL, HDL, LDLCALC, LDLDIRECT, TRIG, CHOLHDL  Significant Diagnostic Results in last 30 days:  Dg Chest Portable 1 View  Result Date: 02/07/2018 CLINICAL DATA:  Increasing shortness  of breath.  Cough. EXAM: PORTABLE CHEST 1 VIEW COMPARISON:  December 11, 2011 FINDINGS: Mild opacity in the left base. Mild opacity in the right base is stable and may represent vascular crowding. Stable cardiomegaly. No other interval changes or acute abnormalities. IMPRESSION: New opacity in the left base.  Recommend follow-up to resolution. Electronically Signed   By: Dorise Bullion III M.D   On: 02/07/2018 12:48    Assessment/Plan Diagnoses and all orders for this visit:  COPD exacerbation ([place])  Stable/ resolved  Current severe episode of major depressive disorder without psychotic features without prior episode (HCC)  Increase Effexor to 75 mg x 3 capsules (225 mg) PO Q Day for depression  Abilify 2 mg po Q Day  Seek counseling after discharge  Follow uo with PCP asap after discharge for continuity of care and medication management  Address Vitamin D Deficiency  Address Vitamin B12 deficiency  Difficulty in voiding  Add Doxazosin 1 mg po Q HS for voinf difficulty  (Drug interaction with Flomax and Intraconazole)  Vitamin D deficiency  Cholecalciferol 2,000 units (6,000 units) PO Q Day x 12 weeks, then   Decrease Cholecalciferol to 2,999 units po Q Day  Monitor D levels  Vitamin B12 deficiency  Cyanocobalamin 2,000 mcg po Q Day x 14 days, then  Cyanocobalamin 1,000 mcg po Q Day  Insomnia, unspecified type  Trazodone 50 mg po Q HS prn  Family/ staff Communication:   Total Time:  Documentation:  Face to Face:  Family/Phone:   Labs/tests ordered:  Cbc, met c, Mag+, B12, D  Medication list reviewed and assessed for continued appropriateness. Monthly medication orders reviewed and signed.  Vikki Ports, NP-C [facility]iatric[facility]  [address], [place] [address] Cell Phone (Mon-Fri 8am-5pm):  [phone] On Call:  [phone] & follow prompts after 5pm & weekends Office Phone:  [phone] Office  Fax:  [phone]

## 2018-03-02 ENCOUNTER — Non-Acute Institutional Stay (SKILLED_NURSING_FACILITY): Payer: Medicare Other | Admitting: Gerontology

## 2018-03-02 DIAGNOSIS — G4700 Insomnia, unspecified: Secondary | ICD-10-CM

## 2018-03-02 DIAGNOSIS — R39198 Other difficulties with micturition: Secondary | ICD-10-CM | POA: Diagnosis not present

## 2018-03-02 DIAGNOSIS — S9031XA Contusion of right foot, initial encounter: Secondary | ICD-10-CM

## 2018-03-02 DIAGNOSIS — G629 Polyneuropathy, unspecified: Secondary | ICD-10-CM | POA: Insufficient documentation

## 2018-03-02 DIAGNOSIS — E559 Vitamin D deficiency, unspecified: Secondary | ICD-10-CM

## 2018-03-02 DIAGNOSIS — J441 Chronic obstructive pulmonary disease with (acute) exacerbation: Secondary | ICD-10-CM | POA: Diagnosis not present

## 2018-03-02 DIAGNOSIS — B351 Tinea unguium: Secondary | ICD-10-CM

## 2018-03-02 DIAGNOSIS — R296 Repeated falls: Secondary | ICD-10-CM | POA: Diagnosis not present

## 2018-03-02 DIAGNOSIS — E46 Unspecified protein-calorie malnutrition: Secondary | ICD-10-CM | POA: Diagnosis not present

## 2018-03-02 DIAGNOSIS — R682 Dry mouth, unspecified: Secondary | ICD-10-CM

## 2018-03-02 DIAGNOSIS — M79609 Pain in unspecified limb: Secondary | ICD-10-CM

## 2018-03-02 DIAGNOSIS — K117 Disturbances of salivary secretion: Secondary | ICD-10-CM

## 2018-03-02 DIAGNOSIS — F322 Major depressive disorder, single episode, severe without psychotic features: Secondary | ICD-10-CM

## 2018-03-02 DIAGNOSIS — E538 Deficiency of other specified B group vitamins: Secondary | ICD-10-CM | POA: Diagnosis not present

## 2018-03-02 NOTE — Progress Notes (Signed)
Location:      Place of Service:  SNF (31)  Provider: Toni Arthurs, NP-C  PCP: Idelle Crouch, MD Patient Care Team: Idelle Crouch, MD as PCP - General (Unknown Physician Specialty)  Extended Emergency Contact Information Primary Emergency Contact: [name],[name]  [place] Phone: [phone] Relation: Sister  Code Status: DNR Goals of care:  Advanced Directive information Advanced Directives 02/24/2018  Does Patient Have a Medical Advance Directive? Yes  Type of Advance Directive Out of facility DNR (pink MOST or yellow form)  Does patient want to make changes to medical advance directive? No - Patient declined  Would patient like information on creating a medical advance directive? -  Pre-existing out of facility DNR order (yellow form or pink MOST form) -     No Known Allergies  Chief Complaint  Patient presents with  . Discharge Note    HPI:  72 y.o. male seen today for discharge evaluation. Pt was admitted to the facility for rehab after hospitalization for COPD exacerbation with respiratory failure and generalized weakness/ deconditioning and frequent falls. Pt has been participating in PT/OT. Pt has been progressing well. Pt is ambulatory independently. He reports he is breathing well, cough is minimal without sputum. No DOE. Pt reports the nebulizers are helping. Pt having difficulty initiating a stream of urine. He has a h/o a prostatectomy. Pt was started on Doxazosin for Urinary Hesitancy (Flomax has a listed significant interaction with Itraconazole.) He reported significant depressive symptoms (as well as his sister reported this.) His partner of 27 years died and he has been depressed, withdrawn since then. He states he lives in a 14 room house that they shared. Everything reminds him of his partner. Tearful at times. Can't move past the death. He reports he still has dreams of him, then wakes up depressed. Pt's medications were  adjusted. He reports he has not really noticed a difference, yet. But states he has been eating better. Long discussion with pt about counseling, talking with others/ friends, pastor, etc. Pt agreeable to seek counseling. Denies SI/HI. Pt was found to have a vitamin B12 and severe vitamin D deficiency- likely exacerbating the depression. Pt was started on supplements. Pt has Onychomycosis. His nails were trimmed by staff at beginning of stay. Pt has been having foot soaks, oral and topical antifungals. Pt is to f/u with PCP and/or podiatry after dc. Dry mouth has improved. Pt was found to have a fracture of the the left great toe on admission. This has improved. Pt denies pain. He is able to fully weight bear without difficulty. Very mild ecchymosis remains on dorsal aspect of foot. Overall, pt reports he is feeling better, stronger and ready to discharge home. Appetite is good, having regular BMs. VSS. No other complaints.          Past Medical History:  Diagnosis Date  . Anxiety   . Anxiety disorder 05/05/2014   unspecifed  . Cancer [facility])    prostate cancer   . Colonic polyp   . COPD (chronic obstructive pulmonary disease) ([place])   . Depression   . Elevated prostate specific antigen (PSA)   . Essential (primary) hypertension 05/05/2014  . 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 GI bleed    due to diverticulitis   . Heart murmur    hx of 1960s  . History of thyroid cancer 05/05/2014  . Hypercholesteremia   . Hyperlipidemia   . Hypertension   . Hypothyroidism 05/05/2014  . Impotence  of organic origin   . Male erectile dysfunction 12/29/2012  . Malignant neoplasm of prostate ([place])    10/30/2011 T1c - Identified by needle biopsy . Gleason 7 (3+4) 5 of 12 cores, Bilateral   . Prediabetes 06/22/2017  . Prostate cancer ([place])   . Shortness of breath on exertion 07/11/2015  . Sleep apnea    CPAP , report on chart , not used in last 2 weeks   . Thyroid cancer ([place])   . Thyroid disease     . Urinary obstruction    not elsewhere classified    Past Surgical History:  Procedure Laterality Date  . BACK SURGERY     lumbar 1987   . BACK SURGERY     C-6- Plate&Pin  . CERVICAL DISC SURGERY    . COLONOSCOPY     10/06/2011, 06/05/2006, 05/06/2002 adematous polyps: CBF 09/2014; Recall Ltr mailed 02/27/2015 (dw)  . COLONOSCOPY WITH PROPOFOL N/A 07/06/2015   Procedure: COLONOSCOPY WITH PROPOFOL;  Surgeon: Manya Silvas, MD;  Location: [facility];  Service: Endoscopy;  Laterality: N/A;  . HERNIA REPAIR     umbilical hernia repair   . LAMINECTOMY FOR EXCISION / EVACUATION INTRASPINAL LESION     lumbar  . OTHER SURGICAL HISTORY     right rotator cuff surgery   . OTHER SURGICAL HISTORY     bilateral tubes in ears   . POSTERIOR LAMINECTOMY / DECOMPRESSION CERVICAL SPINE    . PROSTATE BIOPSY     10/22/2011 Volume:55.8 cc's, PSA:4.5, Free PSA:12%  . ROBOT ASSISTED LAPAROSCOPIC RADICAL PROSTATECTOMY  12/15/2011   Procedure: ROBOTIC ASSISTED LAPAROSCOPIC RADICAL PROSTATECTOMY LEVEL 2;  Surgeon: Dutch Gray, MD;  Location: [facility];  Service: Urology;  Laterality: N/A;      . ROTATOR CUFF REPAIR Right   . TONSILLECTOMY    . TOTAL THYROIDECTOMY        reports that he has been smoking.  He has a 35.00 pack-year smoking history. He uses smokeless tobacco. He reports that he does not drink alcohol or use drugs. Social History   Socioeconomic History  . Marital status: Unknown    Spouse name: Not on file  . Number of children: 0  . Years of education: 15  . Highest education level: Associate degree: occupational, Hotel manager, or vocational program  Occupational History  . Not on file  Social Needs  . Financial resource strain: Not on file  . Food insecurity:    Worry: Not on file    Inability: Not on file  . Transportation needs:    Medical: Not on file    Non-medical: Not on file  Tobacco Use  . Smoking status: Current Every Day Smoker    Packs/day: 1.00    Years: 35.00     Pack years: 35.00  . Smokeless tobacco: Current User  . Tobacco comment: Vapor  Substance and Sexual Activity  . Alcohol use: No  . Drug use: No  . Sexual activity: Not on file  Lifestyle  . Physical activity:    Days per week: Not on file    Minutes per session: Not on file  . Stress: Not on file  Relationships  . Social connections:    Talks on phone: Not on file    Gets together: Not on file    Attends religious service: Not on file    Active member of club or organization: Not on file    Attends meetings of clubs or organizations: Not on file  Relationship status: Not on file  . Intimate partner violence:    Fear of current or ex partner: Not on file    Emotionally abused: Not on file    Physically abused: Not on file    Forced sexual activity: Not on file  Other Topics Concern  . Not on file  Social History Narrative  . Not on file   Functional Status Survey:    No Known Allergies  Pertinent  Health Maintenance Due  Topic Date Due  . INFLUENZA VACCINE  06/17/2018  . COLONOSCOPY  07/05/2025  . PNA vac Low Risk Adult  Completed    Medications: Allergies as of 03/02/2018   No Known Allergies     Medication List        Accurate as of 03/02/18 12:38 PM. Always use your most recent med list.          acetaminophen 325 MG tablet Commonly known as:  TYLENOL Take 650 mg by mouth 3 (three) times daily.   antiseptic oral rinse Liqd 30 mLs by Mouth Rinse route 2 (two) times daily. Rinse mouth and swish, spit   aspirin EC 81 MG tablet Take 81 mg by mouth daily.   budesonide-formoterol 160-4.5 MCG/ACT inhaler Commonly known as:  SYMBICORT Inhale 2 puffs into the lungs 2 (two) times daily.   diazepam 5 MG tablet Commonly known as:  VALIUM Take 5 mg by mouth every 6 (six) hours as needed. Muscle spasm   ENSURE ENLIVE PO Take 1 Bottle by mouth 2 (two) times daily between meals.   feeding supplement (PRO-STAT SUGAR FREE 64) Liqd Take 30 mLs by mouth 2  (two) times daily between meals.   furosemide 40 MG tablet Commonly known as:  LASIX Take 40 mg by mouth daily.   HYDROcodone-acetaminophen 5-325 MG tablet Commonly known as:  NORCO/VICODIN Take 1 tablet by mouth every 6 (six) hours as needed.   ibuprofen 400 MG tablet Commonly known as:  ADVIL,MOTRIN Take 400 mg by mouth every 6 (six) hours as needed.   ipratropium-albuterol 0.5-2.5 (3) MG/3ML Soln Commonly known as:  DUONEB Take 3 mLs by nebulization every 6 (six) hours as needed (Shortness of breath, wheezing.).   itraconazole 100 MG capsule Commonly known as:  SPORANOX Take 200 mg by mouth daily. 2 caps daily x 6 weeks for Onychomycosis of the toenails   levothyroxine 200 MCG tablet Commonly known as:  SYNTHROID, LEVOTHROID Take 200 mcg by mouth daily before breakfast.   losartan-hydrochlorothiazide 50-12.5 MG tablet Commonly known as:  HYZAAR Take 1 tablet by mouth daily before breakfast.   potassium chloride SA 20 MEQ tablet Commonly known as:  K-DUR,KLOR-CON Take 20 mEq by mouth daily.   pravastatin 10 MG tablet Commonly known as:  PRAVACHOL Take 10 mg by mouth daily. HLD (Continue Pravastin while on Itraconazole)   sodium hypochlorite 0.5 % Soln Commonly known as:  DAKIN'S FULL STRENGTH Irrigate with 1 application as directed at bedtime. Use enough solution in a basin to cover the toes. Soak feet for 20 minutes. Dry feet well after soaking. Then apply cream for Onychomychosis of the toenails   venlafaxine XR 75 MG 24 hr capsule Commonly known as:  EFFEXOR-XR Take 150 mg by mouth daily. 2 capsules   VENTOLIN HFA 108 (90 Base) MCG/ACT inhaler Generic drug:  albuterol Inhale 2 puffs into the lungs every 6 (six) hours as needed for wheezing or shortness of breath.   verapamil 180 MG CR tablet Commonly known as:  CALAN-SR  Take 180 mg by mouth daily before breakfast.   VICKS VAPORUB 4.7-1.2-2.6 % Oint Apply liberal amount topically to toenails and skin  surrounding the toenails at bedtime for Onychomychosis   zolpidem 10 MG tablet Commonly known as:  AMBIEN Take 10 mg by mouth at bedtime as needed for sleep.       Review of Systems  Constitutional: Negative for activity change, appetite change, chills, diaphoresis and fever.  HENT: Negative for congestion, mouth sores, nosebleeds, postnasal drip, sneezing, sore throat, trouble swallowing and voice change.   Respiratory: Negative for apnea, cough, choking, chest tightness, shortness of breath and wheezing.   Cardiovascular: Negative for chest pain, palpitations and leg swelling.  Gastrointestinal: Negative for abdominal distention, abdominal pain, constipation, diarrhea and nausea.  Genitourinary: Negative for difficulty urinating, dysuria, frequency and urgency.  Musculoskeletal: Positive for gait problem. Negative for back pain and myalgias. Arthralgias: typical arthritis.  Skin: Negative for color change, pallor, rash and wound.  Neurological: Positive for weakness. Negative for dizziness, tremors, syncope, speech difficulty, numbness and headaches.  Psychiatric/Behavioral: Positive for dysphoric mood. Negative for agitation and behavioral problems.  All other systems reviewed and are negative.   Vitals:   03/02/18 0545  BP: 139/68  Pulse: 70  Resp: 20  Temp: 98.6 F (37 C)  SpO2: 95%  Weight: 280 lb 9.6 oz (127.3 kg)   Body mass index is 36.03 kg/m. Physical Exam  Constitutional: He is oriented to person, place, and time. Vital signs are normal. He appears well-developed and well-nourished. He is active and cooperative. He does not appear ill. No distress.  HENT:  Head: Normocephalic and atraumatic.  Mouth/Throat: Uvula is midline, oropharynx is clear and moist and mucous membranes are normal. Mucous membranes are not pale, not dry and not cyanotic.  Eyes: Pupils are equal, round, and reactive to light. Conjunctivae, EOM and lids are normal.  Neck: Trachea normal, normal  range of motion and full passive range of motion without pain. Neck supple. No JVD present. No tracheal deviation, no edema and no erythema present. No thyromegaly present.  Cardiovascular: Normal rate, regular rhythm, normal heart sounds, intact distal pulses and normal pulses. Exam reveals no gallop, no distant heart sounds and no friction rub.  No murmur heard. Pulses:      Dorsalis pedis pulses are 2+ on the right side, and 2+ on the left side.  No edema  Pulmonary/Chest: Effort normal and breath sounds normal. No accessory muscle usage. No respiratory distress. He has no decreased breath sounds. He has no wheezes. He has no rhonchi. He has no rales. He exhibits no tenderness.  Abdominal: Soft. Normal appearance and bowel sounds are normal. He exhibits no distension and no ascites. There is no tenderness.  Musculoskeletal: Normal range of motion. He exhibits no edema or tenderness.  Expected osteoarthritis, stiffness; Bilateral Calves soft, supple. Negative Homan's Sign. B- pedal pulses equal; generalized weakness  Neurological: He is alert and oriented to person, place, and time. He has normal strength.  Skin: Skin is warm, dry and intact. He is not diaphoretic. No cyanosis. No pallor. Nails show no clubbing.  B- foot fungus  Psychiatric: His speech is normal and behavior is normal. Judgment and thought content normal. Cognition and memory are normal. He exhibits a depressed mood.  Nursing note and vitals reviewed.   Labs reviewed: Basic Metabolic Panel: Recent Labs    02/08/18 0259  02/12/18 0538 02/13/18 0450 02/25/18 1630  NA 140   < > 137 138 138  K 3.2*   < > 2.6* 3.1* 3.4*  CL 99*   < > 94* 98* 100*  CO2 30   < > 31 29 28   GLUCOSE 207*   < > 131* 135* 160*  BUN 23*   < > 29* 30* 26*  CREATININE 0.87   < > 0.98 0.88 1.08  CALCIUM 8.1*   < > 8.7* 8.8* 9.2  MG 1.9  --   --   --  1.9   < > = values in this interval not displayed.   Liver Function Tests: Recent Labs     02/12/18 0538 02/13/18 0450 02/25/18 1630  AST 56* 45* 23  ALT 88* 89* 36  ALKPHOS 69 67 67  BILITOT 1.1 1.0 0.7  PROT 6.6 6.7 6.8  ALBUMIN 3.3* 3.4* 3.6   No results for input(s): LIPASE, AMYLASE in the last 8760 hours. No results for input(s): AMMONIA in the last 8760 hours. CBC: Recent Labs    02/12/18 0538 02/13/18 0450 02/25/18 1630  WBC 7.0 8.0 11.0*  NEUTROABS 3.9 6.0 9.3*  HGB 14.1 14.1 12.9*  HCT 40.7 41.0 38.1*  MCV 91.7 93.3 94.0  PLT 273 308 274   Cardiac Enzymes: Recent Labs    02/07/18 1132  TROPONINI 0.04*   BNP: Invalid input(s): POCBNP CBG: Recent Labs    02/08/18 0752 02/09/18 0747 02/10/18 0744  GLUCAP 156* 143* 138*    Procedures and Imaging Studies During Stay: Dg Chest Portable 1 View  Result Date: 02/07/2018 CLINICAL DATA:  Increasing shortness of breath.  Cough. EXAM: PORTABLE CHEST 1 VIEW COMPARISON:  December 11, 2011 FINDINGS: Mild opacity in the left base. Mild opacity in the right base is stable and may represent vascular crowding. Stable cardiomegaly. No other interval changes or acute abnormalities. IMPRESSION: New opacity in the left base.  Recommend follow-up to resolution. Electronically Signed   By: Dorise Bullion III M.D   On: 02/07/2018 12:48    Assessment/Plan:   1. Current severe episode of major depressive disorder without psychotic features without prior episode (HCC)  Continue Effexor XR 75 mg capsules- 3 capsules (225 mg) PO Q Day  Continue Abilify 2 mg po Q Day  Follow up with PCP asap after discharge for medication management  Encouraged pt to seek counseling   2. Difficulty in voiding  Continue Doxazosin 1 mg po Q HS  PCP to continue medication management/ titration for effectiveness  3. COPD exacerbation (HCC)  Continue Albuterol 90 mcg 2 puffs Q 6 hours prn  Continue Symbicort 160-4.4 mcg- 2 puffs BID  4. Vitamin D deficiency  Continue Cholecalciferol 6000 units PO Q Day through 05/23/18,  then  Decrease dose to Cholecalciferol 2,000 units po Q Day  5. Vitamin B12 deficiency  Continue Cyanocobalamin 2,000 mcg po Q Day through 03/14/18, then  Change dose to Cyanocobalamin 1,000 mcg po Q Day  6. Insomnia, unspecified type  Continue home regimen of Ambien 5 mg po Q HS prn for insomnia  7. Onychomycosis  Continue Itraconazole 200 mg po Q Day until 03/29/18  Continue to apply Vicks Vaporub to the toes at HS until resolved  OK to D/C Dakins solution soaks  8. Pain due to onychomycosis of nail  Continue APAP 650 mg po TID  Continue Ibuprofen 400 mg po Q 6 hours prn  9. Traumatic ecchymosis of foot, right, initial encounter  Continue Tylenol 650 mg po TID for foot pain  Continue Norco 5/325 mg 1 tablet po Q  6 hours prn pain  Ice pack prn  Elevate foot when at rest  10. Xerostomia  Continue Biotene mouth rinse 30 mL swish/spit BID  Continue Biotene- mouth spray- 2 sprays on the tongue Q4 hours prn  11. Frequent falls  Continue PT/OT  Continue exercises as taught by PT/OT  Ambulate with assistance  Safety precautions  12. Protein-calorie malnutrition, unspecified severity (HCC)  Continue Pro-stat 30 mL po BID  Continue Ensure Enlive 1 bottle po BID  13. Neuropathy  Continue Neurontin 200 mg po TID for neuropathic pain  Continue Norco 5/325 mg 1 tablet po Q 6 hours prn pain   Patient is being discharged with the following home health services: HHPT/OT/Nsg   Patient is being discharged with the following durable medical equipment:  RW  Patient has been advised to f/u with their PCP in 1-2 weeks to bring them up to date on their rehab stay.  Social services at facility was responsible for arranging this appointment.  Pt was provided with a 30 day supply of prescriptions for medications and refills must be obtained from their PCP.  For controlled substances, a more limited supply may be provided adequate until PCP appointment only.  Future  labs/tests needed:    Family/ staff Communication:   Total Time: 51 minutes total  Documentation: 20 minutes  Face to Face: 31 minutes  Family/Phone:  Vikki Ports, NP-C [facility] [address], [place] [address] Cell Phone (Mon-Fri 8am-5pm):  [phone] On Call:  [phone] & follow prompts after 5pm & weekends Office Phone:  [phone] Office Fax:  [phone]

## 2018-08-31 ENCOUNTER — Encounter: Payer: Self-pay | Admitting: *Deleted

## 2018-09-01 ENCOUNTER — Ambulatory Visit: Payer: Medicare Other | Admitting: Anesthesiology

## 2018-09-01 ENCOUNTER — Ambulatory Visit
Admission: RE | Admit: 2018-09-01 | Discharge: 2018-09-01 | Disposition: A | Discharge status: Home / Self Care | Payer: Medicare Other | Source: Ambulatory Visit | Attending: Unknown Physician Specialty | Admitting: Unknown Physician Specialty

## 2018-09-01 ENCOUNTER — Encounter
Admission: RE | Disposition: A | Discharge status: Home / Self Care | Payer: Self-pay | Source: Ambulatory Visit | Attending: Unknown Physician Specialty

## 2018-09-01 ENCOUNTER — Encounter: Payer: Self-pay | Admitting: Student

## 2018-09-01 ENCOUNTER — Other Ambulatory Visit: Payer: Self-pay

## 2018-09-01 DIAGNOSIS — Z86010 Personal history of colonic polyps: Secondary | ICD-10-CM | POA: Insufficient documentation

## 2018-09-01 DIAGNOSIS — F17210 Nicotine dependence, cigarettes, uncomplicated: Secondary | ICD-10-CM | POA: Insufficient documentation

## 2018-09-01 DIAGNOSIS — F329 Major depressive disorder, single episode, unspecified: Secondary | ICD-10-CM | POA: Diagnosis not present

## 2018-09-01 DIAGNOSIS — Z1211 Encounter for screening for malignant neoplasm of colon: Secondary | ICD-10-CM | POA: Insufficient documentation

## 2018-09-01 DIAGNOSIS — F419 Anxiety disorder, unspecified: Secondary | ICD-10-CM | POA: Insufficient documentation

## 2018-09-01 DIAGNOSIS — J449 Chronic obstructive pulmonary disease, unspecified: Secondary | ICD-10-CM | POA: Diagnosis not present

## 2018-09-01 DIAGNOSIS — E7800 Pure hypercholesterolemia, unspecified: Secondary | ICD-10-CM | POA: Diagnosis not present

## 2018-09-01 DIAGNOSIS — R972 Elevated prostate specific antigen [PSA]: Secondary | ICD-10-CM | POA: Diagnosis not present

## 2018-09-01 DIAGNOSIS — Z7951 Long term (current) use of inhaled steroids: Secondary | ICD-10-CM | POA: Diagnosis not present

## 2018-09-01 DIAGNOSIS — Z85850 Personal history of malignant neoplasm of thyroid: Secondary | ICD-10-CM | POA: Diagnosis not present

## 2018-09-01 DIAGNOSIS — Z538 Procedure and treatment not carried out for other reasons: Secondary | ICD-10-CM | POA: Insufficient documentation

## 2018-09-01 DIAGNOSIS — R7303 Prediabetes: Secondary | ICD-10-CM | POA: Insufficient documentation

## 2018-09-01 DIAGNOSIS — Z79890 Hormone replacement therapy (postmenopausal): Secondary | ICD-10-CM | POA: Diagnosis not present

## 2018-09-01 DIAGNOSIS — E890 Postprocedural hypothyroidism: Secondary | ICD-10-CM | POA: Diagnosis not present

## 2018-09-01 DIAGNOSIS — Z7982 Long term (current) use of aspirin: Secondary | ICD-10-CM | POA: Insufficient documentation

## 2018-09-01 DIAGNOSIS — Z8546 Personal history of malignant neoplasm of prostate: Secondary | ICD-10-CM | POA: Diagnosis not present

## 2018-09-01 DIAGNOSIS — G4730 Sleep apnea, unspecified: Secondary | ICD-10-CM | POA: Diagnosis not present

## 2018-09-01 DIAGNOSIS — I10 Essential (primary) hypertension: Secondary | ICD-10-CM | POA: Insufficient documentation

## 2018-09-01 DIAGNOSIS — Z79899 Other long term (current) drug therapy: Secondary | ICD-10-CM | POA: Insufficient documentation

## 2018-09-01 HISTORY — PX: COLONOSCOPY WITH PROPOFOL: SHX5780

## 2018-09-01 SURGERY — COLONOSCOPY WITH PROPOFOL
Anesthesia: General

## 2018-09-01 MED ORDER — LIDOCAINE HCL (CARDIAC) PF 100 MG/5ML IV SOSY
PREFILLED_SYRINGE | INTRAVENOUS | Status: DC | PRN
  Administered 2018-09-01: 100 mg via INTRAVENOUS

## 2018-09-01 MED ORDER — FENTANYL CITRATE (PF) 100 MCG/2ML IJ SOLN
INTRAMUSCULAR | Status: DC | PRN
  Administered 2018-09-01: 50 ug via INTRAVENOUS

## 2018-09-01 MED ORDER — MIDAZOLAM HCL 5 MG/5ML IJ SOLN
INTRAMUSCULAR | Status: DC | PRN
  Administered 2018-09-01: 1 mg via INTRAVENOUS

## 2018-09-01 MED ORDER — PROPOFOL 10 MG/ML IV BOLUS
INTRAVENOUS | Status: DC | PRN
  Administered 2018-09-01: 80 mg via INTRAVENOUS
  Administered 2018-09-01: 20 mg via INTRAVENOUS

## 2018-09-01 MED ORDER — SODIUM CHLORIDE 0.9 % IV SOLN
INTRAVENOUS | Status: DC
  Administered 2018-09-01: 14:00:00 via INTRAVENOUS

## 2018-09-01 MED ORDER — SODIUM CHLORIDE 0.9 % IV SOLN: INTRAVENOUS | Status: DC

## 2018-09-01 MED ORDER — PROPOFOL 500 MG/50ML IV EMUL
INTRAVENOUS | Status: DC | PRN
  Administered 2018-09-01: 80 ug/kg/min via INTRAVENOUS

## 2018-09-01 NOTE — Transfer of Care (Signed)
Immediate Anesthesia Transfer of Care Note  Patient: [name]  Procedure(s) Performed: COLONOSCOPY WITH PROPOFOL (N/A )  Patient Location: PACU  Anesthesia Type:General  Level of Consciousness: awake, alert , oriented and patient cooperative  Airway & Oxygen Therapy: Patient Spontanous Breathing and Patient connected to nasal cannula oxygen  Post-op Assessment: Report given to RN and Post -op Vital signs reviewed and stable  Post vital signs: Reviewed and stable  Last Vitals:  Vitals Value Taken Time  BP 137/85 09/01/2018  2:50 PM  Temp 36.1 C 09/01/2018  2:50 PM  Pulse 60 09/01/2018  2:51 PM  Resp 21 09/01/2018  2:51 PM  SpO2 97 % 09/01/2018  2:51 PM  Vitals shown include unvalidated device data.  Last Pain:  Vitals:   09/01/18 1450  TempSrc: Tympanic  PainSc: 0-No pain         Complications: No apparent anesthesia complications

## 2018-09-01 NOTE — Anesthesia Preprocedure Evaluation (Signed)
Anesthesia Evaluation  Patient identified by MRN, date of birth, ID band Patient awake    Reviewed: Allergy & Precautions, H&P , NPO status , Patient's Chart, lab work & pertinent test results  History of Anesthesia Complications Negative for: history of anesthetic complications  Airway Mallampati: III  TM Distance: >3 FB Neck ROM: limited    Dental  (+) Chipped, Poor Dentition   Pulmonary sleep apnea and Continuous Positive Airway Pressure Ventilation , COPD, Current Smoker,           Cardiovascular Exercise Tolerance: Good hypertension, (-) angina(-) Past MI and (-) DOE      Neuro/Psych PSYCHIATRIC DISORDERS negative neurological ROS     GI/Hepatic negative GI ROS, Neg liver ROS, neg GERD  ,  Endo/Other  Hypothyroidism   Renal/GU negative Renal ROS  negative genitourinary   Musculoskeletal   Abdominal   Peds  Hematology negative hematology ROS (+)   Anesthesia Other Findings Past Medical History: No date: Anxiety 05/05/2014: Anxiety disorder     Comment:  unspecifed No date: Cancer [facility])     Comment:  prostate cancer  No date: Colonic polyp No date: COPD (chronic obstructive pulmonary disease) (HCC) No date: Depression No date: Elevated prostate specific antigen (PSA) 05/05/2014: Essential (primary) hypertension 05/29/2015: Male stress incontinence No date: GI bleed     Comment:  due to diverticulitis  No date: Heart murmur     Comment:  hx of 1960s 05/05/2014: History of thyroid cancer No date: Hypercholesteremia No date: Hyperlipidemia No date: Hypertension 05/05/2014: Hypothyroidism No date: Impotence of organic origin 12/29/2012: Male erectile dysfunction No date: Malignant neoplasm of prostate [facility])     Comment:  10/30/2011 T1c - Identified by needle biopsy . Gleason 7              (3+4) 5 of 12 cores, Bilateral  06/22/2017: Prediabetes No date: Prostate cancer ([place]) 07/11/2015: Shortness  of breath on exertion No date: Sleep apnea     Comment:  CPAP , report on chart , not used in last 2 weeks  No date: Thyroid cancer ([place]) No date: Thyroid disease No date: Urinary obstruction     Comment:  not elsewhere classified  Past Surgical History: No date: BACK SURGERY     Comment:  lumbar 1987  No date: BACK SURGERY     Comment:  C-6- Plate&Pin No date: CERVICAL DISC SURGERY No date: COLONOSCOPY     Comment:  10/06/2011, 06/05/2006, 05/06/2002 adematous polyps: CBF               09/2014; Recall Ltr mailed 02/27/2015 (dw) 07/06/2015: COLONOSCOPY WITH PROPOFOL; N/A     Comment:  Procedure: COLONOSCOPY WITH PROPOFOL;  Surgeon: Manya Silvas, MD;  Location: [place];  Service:               Endoscopy;  Laterality: N/A; No date: HERNIA REPAIR     Comment:  umbilical hernia repair  No date: LAMINECTOMY FOR EXCISION / EVACUATION INTRASPINAL LESION     Comment:  lumbar No date: OTHER SURGICAL HISTORY     Comment:  right rotator cuff surgery  No date: OTHER SURGICAL HISTORY     Comment:  bilateral tubes in ears  No date: POSTERIOR LAMINECTOMY / DECOMPRESSION CERVICAL SPINE No date: PROSTATE BIOPSY     Comment:  10/22/2011 Volume:55.8 cc's, PSA:4.5, Free PSA:12% 12/15/2011: ROBOT ASSISTED LAPAROSCOPIC RADICAL PROSTATECTOMY     Comment:  Procedure: ROBOTIC ASSISTED LAPAROSCOPIC RADICAL               PROSTATECTOMY LEVEL 2;  Surgeon: Dutch Gray, MD;                Location: [facility];  Service: Urology;  Laterality: N/A;                   No date: ROTATOR CUFF REPAIR; Right No date: TONSILLECTOMY No date: TOTAL THYROIDECTOMY     Reproductive/Obstetrics negative OB ROS                             Anesthesia Physical Anesthesia Plan  ASA: III  Anesthesia Plan: General   Post-op Pain Management:    Induction: Intravenous  PONV Risk Score and Plan: Propofol infusion and TIVA  Airway Management Planned: Natural Airway and Nasal  Cannula  Additional Equipment:   Intra-op Plan:   Post-operative Plan:   Informed Consent: I have reviewed the patients History and Physical, chart, labs and discussed the procedure including the risks, benefits and alternatives for the proposed anesthesia with the patient or authorized representative who has indicated his/her understanding and acceptance.   Dental Advisory Given  Plan Discussed with: Anesthesiologist, CRNA and Surgeon  Anesthesia Plan Comments: (Patient consented for risks of anesthesia including but not limited to:  - adverse reactions to medications - risk of intubation if required - damage to teeth, lips or other oral mucosa - sore throat or hoarseness - Damage to heart, brain, lungs or loss of life  Patient voiced understanding.)        Anesthesia Quick Evaluation

## 2018-09-01 NOTE — Op Note (Signed)
[facility] Gastroenterology Patient Name: [name] [name] Procedure Date: 09/01/2018 2:21 PM MRN: [id number] Account #: [id number] Date of Birth: 20-Aug-1946 Admit Type: Outpatient Age: 72 Room: [facility] ENDO ROOM 2 Gender: Male Note Status: Finalized Procedure:            Colonoscopy Providers:            Manya Silvas, MD Referring MD:         Leonie Douglas. Doy Hutching, MD (Referring MD) Complications:        No immediate complications. Procedure:            Pre-Anesthesia Assessment:                       - After reviewing the risks and benefits, the patient                        was deemed in satisfactory condition to undergo the                        procedure.                       - The heart rate, respiratory rate, oxygen saturations,                        blood pressure, adequacy of pulmonary ventilation, and                        response to care were monitored throughout the                        procedure.                       After obtaining informed consent, the colonoscope was                        passed under direct vision. Throughout the procedure,                        the patient's blood pressure, pulse, and oxygen                        saturations were monitored continuously. The                        Colonoscope was introduced through the anus with the                        intention of advancing to the cecum. The scope was                        advanced to the sigmoid colon before the procedure was                        aborted. Medications were given. The quality of the                        bowel preparation was poor. The colonoscopy was aborted  due to poor bowel prep with stool present. Findings:      The colon was un passable due to large amount of thick liquid and this       stool was coming out the anal opening spraying a liquid thick stool so       the procedure was aborted.      The exam was otherwise  without abnormality. Impression:           - Preparation of the colon was poor.                       - The procedure was aborted due to poor bowel prep with                        stool present.                       - No specimens collected. Recommendation:       - The findings and recommendations were discussed with                        the patient. Manya Silvas, MD 09/01/2018 2:51:25 PM This report has been signed electronically. Number of Addenda: 0 Note Initiated On: 09/01/2018 2:21 PM Total Procedure Duration: 0 hours 7 minutes 12 seconds       [facility]

## 2018-09-01 NOTE — Anesthesia Post-op Follow-up Note (Signed)
Anesthesia QCDR form completed.        

## 2018-09-01 NOTE — H&P (Signed)
Primary Care Physician:  Idelle Crouch, MD Primary Gastroenterologist:  Dr. Vira Agar  Pre-Procedure History & Physical: HPI:  [name] [name] is a 72 y.o. male is here for an colonoscopy.  Hx of previous polyps.  07/06/2015 last colon.   Past Medical History:  Diagnosis Date  . Anxiety   . Anxiety disorder 05/05/2014   unspecifed  . Cancer [facility])    prostate cancer   . Colonic polyp   . COPD (chronic obstructive pulmonary disease) ([place])   . Depression   . Elevated prostate specific antigen (PSA)   . Essential (primary) hypertension 05/05/2014  . 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 GI bleed    due to diverticulitis   . Heart murmur    hx of 1960s  . History of thyroid cancer 05/05/2014  . Hypercholesteremia   . Hyperlipidemia   . Hypertension   . Hypothyroidism 05/05/2014  . Impotence of organic origin   . Male erectile dysfunction 12/29/2012  . Malignant neoplasm of prostate ([place])    10/30/2011 T1c - Identified by needle biopsy . Gleason 7 (3+4) 5 of 12 cores, Bilateral   . Prediabetes 06/22/2017  . Prostate cancer ([place])   . Shortness of breath on exertion 07/11/2015  . Sleep apnea    CPAP , report on chart , not used in last 2 weeks   . Thyroid cancer ([place])   . Thyroid disease   . Urinary obstruction    not elsewhere classified    Past Surgical History:  Procedure Laterality Date  . BACK SURGERY     lumbar 1987   . BACK SURGERY     C-6- Plate&Pin  . CERVICAL DISC SURGERY    . COLONOSCOPY     10/06/2011, 06/05/2006, 05/06/2002 adematous polyps: CBF 09/2014; Recall Ltr mailed 02/27/2015 (dw)  . COLONOSCOPY WITH PROPOFOL N/A 07/06/2015   Procedure: COLONOSCOPY WITH PROPOFOL;  Surgeon: Manya Silvas, MD;  Location: [facility] ENDOSCOPY;  Service: Endoscopy;  Laterality: N/A;  . HERNIA REPAIR     umbilical hernia repair   . LAMINECTOMY FOR EXCISION / EVACUATION INTRASPINAL LESION     lumbar  . OTHER SURGICAL HISTORY     right rotator cuff surgery   . OTHER  SURGICAL HISTORY     bilateral tubes in ears   . POSTERIOR LAMINECTOMY / DECOMPRESSION CERVICAL SPINE    . PROSTATE BIOPSY     10/22/2011 Volume:55.8 cc's, PSA:4.5, Free PSA:12%  . ROBOT ASSISTED LAPAROSCOPIC RADICAL PROSTATECTOMY  12/15/2011   Procedure: ROBOTIC ASSISTED LAPAROSCOPIC RADICAL PROSTATECTOMY LEVEL 2;  Surgeon: Dutch Gray, MD;  Location: [facility] ORS;  Service: Urology;  Laterality: N/A;      . ROTATOR CUFF REPAIR Right   . TONSILLECTOMY    . TOTAL THYROIDECTOMY      Prior to Admission medications   Medication Sig Start Date End Date Taking? Authorizing Provider  acetaminophen (TYLENOL) 325 MG tablet Take 650 mg by mouth 3 (three) times daily.   Yes [provider]  albuterol (VENTOLIN HFA) 108 (90 Base) MCG/ACT inhaler Inhale 2 puffs into the lungs every 6 (six) hours as needed for wheezing or shortness of breath.   Yes [provider]  aspirin EC 81 MG tablet Take 81 mg by mouth daily.    Yes [provider]  budesonide-formoterol (SYMBICORT) 160-4.5 MCG/ACT inhaler Inhale 2 puffs into the lungs 2 (two) times daily.    Yes [provider]  diazepam (VALIUM) 5 MG tablet Take 5 mg by mouth  every 6 (six) hours as needed. Muscle spasm    Yes [provider]  Eszopiclone (LUNESTA PO) Take 3 mg by mouth.   Yes [provider]  furosemide (LASIX) 40 MG tablet Take 40 mg by mouth daily.    Yes [provider]  ipratropium-albuterol (DUONEB) 0.5-2.5 (3) MG/3ML SOLN Take 3 mLs by nebulization every 6 (six) hours as needed (Shortness of breath, wheezing.). 02/10/18  Yes Sainani, Belia Heman, MD  levothyroxine (SYNTHROID, LEVOTHROID) 200 MCG tablet Take 200 mcg by mouth daily before breakfast.   Yes [provider]  losartan-hydrochlorothiazide (HYZAAR) 50-12.5 MG per tablet Take 1 tablet by mouth daily before breakfast.    Yes [provider]  pravastatin (PRAVACHOL) 10 MG tablet Take 10 mg by mouth daily. HLD  (Continue Pravastin while on Itraconazole)   Yes [provider]  verapamil (CALAN-SR) 180 MG CR tablet Take 180 mg by mouth daily before breakfast.    Yes [provider]  Amino Acids-Protein Hydrolys (FEEDING SUPPLEMENT, PRO-STAT SUGAR FREE 64,) LIQD Take 30 mLs by mouth 2 (two) times daily between meals.    [provider]  antiseptic oral rinse (BIOTENE) LIQD 30 mLs by Mouth Rinse route 2 (two) times daily. Rinse mouth and swish, spit    [provider]  Camphor-Eucalyptus-Menthol (VICKS VAPORUB) 4.7-1.2-2.6 % OINT Apply liberal amount topically to toenails and skin surrounding the toenails at bedtime for Onychomychosis    [provider]  HYDROcodone-acetaminophen (NORCO/VICODIN) 5-325 MG tablet Take 1 tablet by mouth every 6 (six) hours as needed. Patient not taking: Reported on 09/01/2018 02/17/18   Toni Arthurs, NP  ibuprofen (ADVIL,MOTRIN) 400 MG tablet Take 400 mg by mouth every 6 (six) hours as needed.    [provider]  Nutritional Supplements (ENSURE ENLIVE PO) Take 1 Bottle by mouth 2 (two) times daily between meals.    [provider]  potassium chloride SA (K-DUR,KLOR-CON) 20 MEQ tablet Take 20 mEq by mouth daily.    [provider]  sodium hypochlorite (DAKIN'S FULL STRENGTH) 0.5 % SOLN Irrigate with 1 application as directed at bedtime. Use enough solution in a basin to cover the toes. Soak feet for 20 minutes. Dry feet well after soaking. Then apply cream for Onychomychosis of the toenails    [provider]  venlafaxine XR (EFFEXOR-XR) 75 MG 24 hr capsule Take 150 mg by mouth daily. 2 capsules 01/18/18   [provider]  zolpidem (AMBIEN) 10 MG tablet Take 10 mg by mouth at bedtime as needed for sleep.    [provider]    Allergies as of 06/28/2018  . (No Known Allergies)    Family History  Problem Relation Age of Onset  . Hypertension Mother   . Anxiety disorder Mother   .  Stroke Mother        "mini strokes"  . Coronary artery disease Mother   . Heart attack Mother   . Emphysema Father   . Heart disease Father   . Scoliosis Father   . Coronary artery disease Father   . GU problems Neg Hx   . Kidney disease Neg Hx   . Prostate cancer Neg Hx     Social History   Socioeconomic History  . Marital status: Unknown    Spouse name: Not on file  . Number of children: 0  . Years of education: 96  . Highest education level: Associate degree: occupational, Hotel manager, or vocational program  Occupational History  . Not  on file  Social Needs  . Financial resource strain: Not on file  . Food insecurity:    Worry: Not on file    Inability: Not on file  . Transportation needs:    Medical: Not on file    Non-medical: Not on file  Tobacco Use  . Smoking status: Current Every Day Smoker    Packs/day: 1.00    Years: 35.00    Pack years: 35.00  . Smokeless tobacco: Current User  . Tobacco comment: Vapor  Substance and Sexual Activity  . Alcohol use: No  . Drug use: No  . Sexual activity: Not on file  Lifestyle  . Physical activity:    Days per week: Not on file    Minutes per session: Not on file  . Stress: Not on file  Relationships  . Social connections:    Talks on phone: Not on file    Gets together: Not on file    Attends religious service: Not on file    Active member of club or organization: Not on file    Attends meetings of clubs or organizations: Not on file    Relationship status: Not on file  . Intimate partner violence:    Fear of current or ex partner: Not on file    Emotionally abused: Not on file    Physically abused: Not on file    Forced sexual activity: Not on file  Other Topics Concern  . Not on file  Social History Narrative  . Not on file    Review of Systems: See HPI, otherwise negative ROS  Physical Exam: BP (!) 161/83   Pulse 66   Temp (!) 96.9 F (36.1 C) (Tympanic)   Resp 20   SpO2 99%  General:   Alert,   pleasant and cooperative in NAD Head:  Normocephalic and atraumatic. Neck:  Supple; no masses or thyromegaly. Lungs:  Clear throughout to auscultation.    Heart:  Regular rate and rhythm. Abdomen:  Soft, nontender and nondistended. Normal bowel sounds, without guarding, and without rebound.   Neurologic:  Alert and  oriented x4;  grossly normal neurologically.  Impression/Plan: [name] [name] is here for an colonoscopy to be performed for [facility] colon polyps.  Risks, benefits, limitations, and alternatives regarding  colonoscopy have been reviewed with the patient.  Questions have been answered.  All parties agreeable.   Gaylyn Cheers, MD  09/01/2018, 2:28 PM

## 2018-09-01 NOTE — Anesthesia Postprocedure Evaluation (Signed)
Anesthesia Post Note  Patient: [name]  Procedure(s) Performed: COLONOSCOPY WITH PROPOFOL (N/A )  Patient location during evaluation: Endoscopy Anesthesia Type: General Level of consciousness: awake and alert Pain management: pain level controlled Vital Signs Assessment: post-procedure vital signs reviewed and stable Respiratory status: spontaneous breathing, nonlabored ventilation, respiratory function stable and patient connected to nasal cannula oxygen Cardiovascular status: blood pressure returned to baseline and stable Postop Assessment: no apparent nausea or vomiting Anesthetic complications: no     Last Vitals:  Vitals:   09/01/18 1500 09/01/18 1510  BP: 135/87 (!) 142/82  Pulse: 61 (!) 59  Resp: 20 20  Temp:    SpO2: 98% 96%    Last Pain:  Vitals:   09/01/18 1510  TempSrc:   PainSc: 0-No pain                 Kamauri Kathol S

## 2018-09-02 ENCOUNTER — Encounter: Payer: Self-pay | Admitting: Unknown Physician Specialty

## 2018-09-07 ENCOUNTER — Other Ambulatory Visit: Payer: Self-pay

## 2018-09-07 ENCOUNTER — Encounter: Payer: Self-pay | Admitting: *Deleted

## 2018-09-13 NOTE — Discharge Instructions (Signed)

## 2018-09-14 NOTE — Anesthesia Preprocedure Evaluation (Addendum)
Anesthesia Evaluation  Patient identified by MRN, date of birth, ID band Patient awake    Reviewed: Allergy & Precautions, NPO status , Patient's Chart, lab work & pertinent test results  History of Anesthesia Complications Negative for: history of anesthetic complications  Airway Mallampati: I  TM Distance: >3 FB Neck ROM: Full    Dental no notable dental hx.    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 male with  1. Essential hypertension  2. Sleep apnea, unspecified type  3. Pulmonary emphysema, unspecified emphysema type (CMS-HCC)  4. SOB (shortness of breath) on exertion  5. Hyperlipidemia, unspecified hyperlipidemia type  6. Encounter for medication management   72 year old gentleman with known coronary artery disease, currently without chest pain. Patient has chronic exertional dyspnea felt to be due to underlying COPD. 2D echocardiogram revealed normal left ventricular function, with LVEF greater than 55%. The patient has essential hypertension, systolic blood pressure mildly elevated on current BP medications. The patient has hyperlipidemia with excellent control of LDL cholesterol on simvastatin.  Plan   1. Continue current medications 2. Counseled patient about low-sodium diet 3. DASH diet printed instructions given to the patient 4. Counseled patient  about low-cholesterol diet 5. Continue simvastatin for hyperlipidemia management  6. Low-fat and cholesterol diet printed instructions given to the patient 7. Encouraged patient to stop smoking 8. Encourage patient to lose weight 9. Return to clinic for follow-up in 6 months   Reproductive/Obstetrics                            Anesthesia Physical Anesthesia Plan  ASA: III  Anesthesia Plan: MAC   Post-op Pain Management:    Induction: Intravenous  PONV Risk Score and Plan: TIVA and Midazolam  Airway Management Planned: Natural Airway  Additional Equipment:   Intra-op Plan:   Post-operative Plan:   Informed Consent: I have reviewed the patients History and Physical, chart, labs and discussed the procedure including the risks, benefits and alternatives for the proposed anesthesia with the patient or authorized representative who has indicated his/her understanding and acceptance.     Plan Discussed with: CRNA  Anesthesia Plan Comments:        Anesthesia Quick Evaluation

## 2018-09-15 ENCOUNTER — Ambulatory Visit: Payer: Medicare Other | Admitting: Anesthesiology

## 2018-09-15 ENCOUNTER — Encounter
Admission: RE | Disposition: A | Discharge status: Home / Self Care | Payer: Self-pay | Source: Ambulatory Visit | Attending: Ophthalmology

## 2018-09-15 ENCOUNTER — Ambulatory Visit
Admission: RE | Admit: 2018-09-15 | Discharge: 2018-09-15 | Disposition: A | Discharge status: Home / Self Care | Payer: Medicare Other | Source: Ambulatory Visit | Attending: Ophthalmology | Admitting: Ophthalmology

## 2018-09-15 DIAGNOSIS — F17200 Nicotine dependence, unspecified, uncomplicated: Secondary | ICD-10-CM | POA: Insufficient documentation

## 2018-09-15 DIAGNOSIS — Z7982 Long term (current) use of aspirin: Secondary | ICD-10-CM | POA: Insufficient documentation

## 2018-09-15 DIAGNOSIS — J449 Chronic obstructive pulmonary disease, unspecified: Secondary | ICD-10-CM | POA: Diagnosis not present

## 2018-09-15 DIAGNOSIS — G4730 Sleep apnea, unspecified: Secondary | ICD-10-CM | POA: Insufficient documentation

## 2018-09-15 DIAGNOSIS — Z85850 Personal history of malignant neoplasm of thyroid: Secondary | ICD-10-CM | POA: Diagnosis not present

## 2018-09-15 DIAGNOSIS — E7800 Pure hypercholesterolemia, unspecified: Secondary | ICD-10-CM | POA: Diagnosis not present

## 2018-09-15 DIAGNOSIS — I10 Essential (primary) hypertension: Secondary | ICD-10-CM | POA: Insufficient documentation

## 2018-09-15 DIAGNOSIS — H2511 Age-related nuclear cataract, right eye: Secondary | ICD-10-CM | POA: Insufficient documentation

## 2018-09-15 DIAGNOSIS — Z79899 Other long term (current) drug therapy: Secondary | ICD-10-CM | POA: Insufficient documentation

## 2018-09-15 DIAGNOSIS — Z8546 Personal history of malignant neoplasm of prostate: Secondary | ICD-10-CM | POA: Insufficient documentation

## 2018-09-15 DIAGNOSIS — E039 Hypothyroidism, unspecified: Secondary | ICD-10-CM | POA: Insufficient documentation

## 2018-09-15 DIAGNOSIS — F419 Anxiety disorder, unspecified: Secondary | ICD-10-CM | POA: Insufficient documentation

## 2018-09-15 HISTORY — DX: Dyspnea, unspecified: R06.00

## 2018-09-15 HISTORY — PX: CATARACT EXTRACTION W/PHACO: SHX586

## 2018-09-15 SURGERY — PHACOEMULSIFICATION, CATARACT, WITH IOL INSERTION
Anesthesia: Monitor Anesthesia Care | Site: Eye | Laterality: Right

## 2018-09-15 MED ORDER — CEFUROXIME OPHTHALMIC INJECTION 1 MG/0.1 ML
INJECTION | OPHTHALMIC | Status: DC | PRN
  Administered 2018-09-15: 0.1 mL via OPHTHALMIC

## 2018-09-15 MED ORDER — LIDOCAINE HCL (PF) 2 % IJ SOLN
INTRAOCULAR | Status: DC | PRN
  Administered 2018-09-15: .5 mL

## 2018-09-15 MED ORDER — ACETAMINOPHEN 325 MG PO TABS: 325.0000 mg | ORAL_TABLET | ORAL | Status: DC | PRN

## 2018-09-15 MED ORDER — MIDAZOLAM HCL 2 MG/2ML IJ SOLN
INTRAMUSCULAR | Status: DC | PRN
  Administered 2018-09-15: 2 mg via INTRAVENOUS
  Administered 2018-09-15: 1 mg via INTRAVENOUS

## 2018-09-15 MED ORDER — FENTANYL CITRATE (PF) 100 MCG/2ML IJ SOLN
INTRAMUSCULAR | Status: DC | PRN
  Administered 2018-09-15 (×2): 50 ug via INTRAVENOUS

## 2018-09-15 MED ORDER — BRIMONIDINE TARTRATE-TIMOLOL 0.2-0.5 % OP SOLN
OPHTHALMIC | Status: DC | PRN
  Administered 2018-09-15: 1 [drp] via OPHTHALMIC

## 2018-09-15 MED ORDER — DEXAMETHASONE SODIUM PHOSPHATE 4 MG/ML IJ SOLN: 8.0000 mg | Freq: Once | INTRAMUSCULAR | Status: DC | PRN

## 2018-09-15 MED ORDER — LACTATED RINGERS IV SOLN: 500.0000 mL | INTRAVENOUS | Status: DC

## 2018-09-15 MED ORDER — ACETAMINOPHEN 160 MG/5ML PO SOLN: 325.0000 mg | ORAL | Status: DC | PRN

## 2018-09-15 MED ORDER — ARMC OPHTHALMIC DILATING DROPS
1.0000 "application " | OPHTHALMIC | Status: DC | PRN
  Administered 2018-09-15 (×3): 1 via OPHTHALMIC

## 2018-09-15 MED ORDER — MOXIFLOXACIN HCL 0.5 % OP SOLN
1.0000 [drp] | OPHTHALMIC | Status: DC | PRN
  Administered 2018-09-15 (×3): 1 [drp] via OPHTHALMIC

## 2018-09-15 MED ORDER — EPINEPHRINE PF 1 MG/ML IJ SOLN
INTRAOCULAR | Status: DC | PRN
  Administered 2018-09-15: 58 mL via OPHTHALMIC

## 2018-09-15 MED ORDER — TETRACAINE HCL 0.5 % OP SOLN
1.0000 [drp] | OPHTHALMIC | Status: DC | PRN
  Administered 2018-09-15 (×2): 1 [drp] via OPHTHALMIC

## 2018-09-15 MED ORDER — LACTATED RINGERS IV SOLN: INTRAVENOUS | Status: DC

## 2018-09-15 MED ORDER — NA HYALUR & NA CHOND-NA HYALUR 0.4-0.35 ML IO KIT
PACK | INTRAOCULAR | Status: DC | PRN
  Administered 2018-09-15: 1 mL via INTRAOCULAR

## 2018-09-15 SURGICAL SUPPLY — 27 items
CANNULA ANT/CHMB 27G (MISCELLANEOUS) ×1 IMPLANT
CANNULA ANT/CHMB 27GA (MISCELLANEOUS) ×3 IMPLANT
CARTRIDGE ABBOTT (MISCELLANEOUS) IMPLANT
GLOVE SURG LX 7.5 STRW (GLOVE) ×2
GLOVE SURG LX STRL 7.5 STRW (GLOVE) ×1 IMPLANT
GLOVE SURG TRIUMPH 8.0 PF LTX (GLOVE) ×3 IMPLANT
GOWN STRL REUS W/ TWL LRG LVL3 (GOWN DISPOSABLE) ×2 IMPLANT
GOWN STRL REUS W/TWL LRG LVL3 (GOWN DISPOSABLE) ×4
LENS IOL TECNIS ITEC 23.0 (Intraocular Lens) ×2 IMPLANT
MARKER SKIN DUAL TIP RULER LAB (MISCELLANEOUS) ×3 IMPLANT
NDL FILTER BLUNT 18X1 1/2 (NEEDLE) ×1 IMPLANT
NDL RETROBULBAR .5 NSTRL (NEEDLE) IMPLANT
NEEDLE FILTER BLUNT 18X 1/2SAF (NEEDLE) ×2
NEEDLE FILTER BLUNT 18X1 1/2 (NEEDLE) ×1 IMPLANT
PACK CATARACT BRASINGTON (MISCELLANEOUS) ×3 IMPLANT
PACK EYE AFTER SURG (MISCELLANEOUS) ×3 IMPLANT
PACK OPTHALMIC (MISCELLANEOUS) ×3 IMPLANT
RING MALYGIN 7.0 (MISCELLANEOUS) IMPLANT
SUT ETHILON 10-0 CS-B-6CS-B-6 (SUTURE)
SUT VICRYL  9 0 (SUTURE)
SUT VICRYL 9 0 (SUTURE) IMPLANT
SUTURE EHLN 10-0 CS-B-6CS-B-6 (SUTURE) IMPLANT
SYR 3ML LL SCALE MARK (SYRINGE) ×3 IMPLANT
SYR 5ML LL (SYRINGE) ×3 IMPLANT
SYR TB 1ML LUER SLIP (SYRINGE) ×3 IMPLANT
WATER STERILE IRR 500ML POUR (IV SOLUTION) ×3 IMPLANT
WIPE NON LINTING 3.25X3.25 (MISCELLANEOUS) ×3 IMPLANT

## 2018-09-15 NOTE — Op Note (Signed)
LOCATION:  [place]   PREOPERATIVE DIAGNOSIS:    Nuclear sclerotic cataract right eye. H25.11   POSTOPERATIVE DIAGNOSIS:  Nuclear sclerotic cataract right eye.     PROCEDURE:  Phacoemusification with posterior chamber intraocular lens placement of the right eye   LENS:   Implant Name Type Inv. Item Serial No. Manufacturer Lot No. LRB No. Used  LENS IOL DIOP 23.0 - [id number] Intraocular Lens LENS IOL DIOP 23.0 [id number] AMO  Right 1        ULTRASOUND TIME: 17 % of 0 minutes, 44 seconds.  CDE 7.8   SURGEON:  Wyonia Hough, MD   ANESTHESIA:  Topical with tetracaine drops and 2% Xylocaine jelly, augmented with 1% preservative-free intracameral lidocaine.    COMPLICATIONS:  None.   DESCRIPTION OF PROCEDURE:  The patient was identified in the holding room and transported to the operating room and placed in the supine position under the operating microscope.  The right eye was identified as the operative eye and it was prepped and draped in the usual sterile ophthalmic fashion.   A 1 millimeter clear-corneal paracentesis was made at the 12:00 position.  0.5 ml of preservative-free 1% lidocaine was injected into the anterior chamber. The anterior chamber was filled with Viscoat viscoelastic.  A 2.4 millimeter keratome was used to make a near-clear corneal incision at the 9:00 position.  A curvilinear capsulorrhexis was made with a cystotome and capsulorrhexis forceps.  Balanced salt solution was used to hydrodissect and hydrodelineate the nucleus.   Phacoemulsification was then used in stop and chop fashion to remove the lens nucleus and epinucleus.  The remaining cortex was then removed using the irrigation and aspiration handpiece. Provisc was then placed into the capsular bag to distend it for lens placement.  A lens was then injected into the capsular bag.  The remaining viscoelastic was aspirated.   Wounds were hydrated with balanced salt solution.  The anterior  chamber was inflated to a physiologic pressure with balanced salt solution.  No wound leaks were noted. Cefuroxime 0.1 ml of a 10mg /ml solution was injected into the anterior chamber for a dose of 1 mg of intracameral antibiotic at the completion of the case.   Timolol and Brimonidine drops were applied to the eye.  The patient was taken to the recovery room in stable condition without complications of anesthesia or surgery.   [name] [name] 09/15/2018, 12:59 PM

## 2018-09-15 NOTE — Transfer of Care (Signed)
Immediate Anesthesia Transfer of Care Note  Patient: [name] [name]  Procedure(s) Performed: CATARACT EXTRACTION PHACO AND INTRAOCULAR LENS PLACEMENT (IOC) RIGHT (Right Eye)  Patient Location: PACU  Anesthesia Type: MAC  Level of Consciousness: awake, alert  and patient cooperative  Airway and Oxygen Therapy: Patient Spontanous Breathing and Patient connected to supplemental oxygen  Post-op Assessment: Post-op Vital signs reviewed, Patient's Cardiovascular Status Stable, Respiratory Function Stable, Patent Airway and No signs of Nausea or vomiting  Post-op Vital Signs: Reviewed and stable  Complications: No apparent anesthesia complications

## 2018-09-15 NOTE — H&P (Signed)
The History and Physical notes are on paper, have been signed, and are to be scanned. The patient remains stable and unchanged from the H&P.   Previous H&P reviewed, patient examined, and there are no changes.  [name] [name] 09/15/2018 11:39 AM

## 2018-09-15 NOTE — Anesthesia Postprocedure Evaluation (Signed)
Anesthesia Post Note  Patient: [name] [name]  Procedure(s) Performed: CATARACT EXTRACTION PHACO AND INTRAOCULAR LENS PLACEMENT (IOC) RIGHT (Right Eye)  Patient location during evaluation: PACU Anesthesia Type: MAC Level of consciousness: awake and alert, oriented and patient cooperative Pain management: pain level controlled Vital Signs Assessment: post-procedure vital signs reviewed and stable Respiratory status: spontaneous breathing, nonlabored ventilation and respiratory function stable Cardiovascular status: blood pressure returned to baseline and stable Postop Assessment: adequate PO intake Anesthetic complications: no    Darrin Nipper

## 2018-09-15 NOTE — Anesthesia Procedure Notes (Signed)
Procedure Name: MAC Date/Time: 09/15/2018 12:40 PM Performed by: Janna Arch, CRNA Pre-anesthesia Checklist: Patient identified, Emergency Drugs available, Suction available, Timeout performed and Patient being monitored Patient Re-evaluated:Patient Re-evaluated prior to induction Oxygen Delivery Method: Nasal cannula Placement Confirmation: positive ETCO2

## 2018-09-16 ENCOUNTER — Encounter: Payer: Self-pay | Admitting: Ophthalmology

## 2018-09-29 ENCOUNTER — Encounter: Payer: Self-pay | Admitting: *Deleted

## 2018-09-29 ENCOUNTER — Other Ambulatory Visit: Payer: Self-pay

## 2018-09-30 NOTE — Discharge Instructions (Signed)

## 2018-10-06 ENCOUNTER — Ambulatory Visit: Payer: Medicare Other | Admitting: Anesthesiology

## 2018-10-06 ENCOUNTER — Ambulatory Visit
Admission: RE | Admit: 2018-10-06 | Discharge: 2018-10-06 | Disposition: A | Discharge status: Home / Self Care | Payer: Medicare Other | Source: Ambulatory Visit | Attending: Ophthalmology | Admitting: Ophthalmology

## 2018-10-06 ENCOUNTER — Encounter
Admission: RE | Disposition: A | Discharge status: Home / Self Care | Payer: Self-pay | Source: Ambulatory Visit | Attending: Ophthalmology

## 2018-10-06 DIAGNOSIS — E890 Postprocedural hypothyroidism: Secondary | ICD-10-CM | POA: Diagnosis not present

## 2018-10-06 DIAGNOSIS — Z85850 Personal history of malignant neoplasm of thyroid: Secondary | ICD-10-CM | POA: Diagnosis not present

## 2018-10-06 DIAGNOSIS — Z9849 Cataract extraction status, unspecified eye: Secondary | ICD-10-CM | POA: Diagnosis not present

## 2018-10-06 DIAGNOSIS — M7989 Other specified soft tissue disorders: Secondary | ICD-10-CM | POA: Diagnosis not present

## 2018-10-06 DIAGNOSIS — E785 Hyperlipidemia, unspecified: Secondary | ICD-10-CM | POA: Insufficient documentation

## 2018-10-06 DIAGNOSIS — I081 Rheumatic disorders of both mitral and tricuspid valves: Secondary | ICD-10-CM | POA: Diagnosis not present

## 2018-10-06 DIAGNOSIS — H2512 Age-related nuclear cataract, left eye: Secondary | ICD-10-CM | POA: Insufficient documentation

## 2018-10-06 DIAGNOSIS — I10 Essential (primary) hypertension: Secondary | ICD-10-CM | POA: Diagnosis not present

## 2018-10-06 DIAGNOSIS — E7800 Pure hypercholesterolemia, unspecified: Secondary | ICD-10-CM | POA: Diagnosis not present

## 2018-10-06 DIAGNOSIS — Z6838 Body mass index (BMI) 38.0-38.9, adult: Secondary | ICD-10-CM | POA: Insufficient documentation

## 2018-10-06 DIAGNOSIS — F17200 Nicotine dependence, unspecified, uncomplicated: Secondary | ICD-10-CM | POA: Diagnosis not present

## 2018-10-06 DIAGNOSIS — M1990 Unspecified osteoarthritis, unspecified site: Secondary | ICD-10-CM | POA: Insufficient documentation

## 2018-10-06 DIAGNOSIS — F419 Anxiety disorder, unspecified: Secondary | ICD-10-CM | POA: Insufficient documentation

## 2018-10-06 DIAGNOSIS — J439 Emphysema, unspecified: Secondary | ICD-10-CM | POA: Insufficient documentation

## 2018-10-06 DIAGNOSIS — G4730 Sleep apnea, unspecified: Secondary | ICD-10-CM | POA: Diagnosis not present

## 2018-10-06 DIAGNOSIS — Z8546 Personal history of malignant neoplasm of prostate: Secondary | ICD-10-CM | POA: Insufficient documentation

## 2018-10-06 HISTORY — PX: CATARACT EXTRACTION W/PHACO: SHX586

## 2018-10-06 SURGERY — PHACOEMULSIFICATION, CATARACT, WITH IOL INSERTION
Anesthesia: Monitor Anesthesia Care | Site: Eye | Laterality: Left

## 2018-10-06 MED ORDER — CEFUROXIME OPHTHALMIC INJECTION 1 MG/0.1 ML
INJECTION | OPHTHALMIC | Status: DC | PRN
  Administered 2018-10-06: 1 mg via INTRACAMERAL

## 2018-10-06 MED ORDER — BRIMONIDINE TARTRATE-TIMOLOL 0.2-0.5 % OP SOLN
OPHTHALMIC | Status: DC | PRN
  Administered 2018-10-06: 1 [drp] via OPHTHALMIC

## 2018-10-06 MED ORDER — EPINEPHRINE PF 1 MG/ML IJ SOLN
INTRAOCULAR | Status: DC | PRN
  Administered 2018-10-06: 70 mL via OPHTHALMIC

## 2018-10-06 MED ORDER — ONDANSETRON HCL 4 MG/2ML IJ SOLN
INTRAMUSCULAR | Status: DC | PRN
  Administered 2018-10-06: 4 mg via INTRAVENOUS

## 2018-10-06 MED ORDER — MOXIFLOXACIN HCL 0.5 % OP SOLN
1.0000 [drp] | OPHTHALMIC | Status: DC | PRN
  Administered 2018-10-06 (×3): 1 [drp] via OPHTHALMIC

## 2018-10-06 MED ORDER — ARMC OPHTHALMIC DILATING DROPS
1.0000 "application " | OPHTHALMIC | Status: DC | PRN
  Administered 2018-10-06 (×3): 1 via OPHTHALMIC

## 2018-10-06 MED ORDER — LIDOCAINE HCL (PF) 2 % IJ SOLN
INTRAOCULAR | Status: DC | PRN
  Administered 2018-10-06: 1 mL

## 2018-10-06 MED ORDER — ONDANSETRON HCL 4 MG/2ML IJ SOLN: 4.0000 mg | Freq: Once | INTRAMUSCULAR | Status: DC | PRN

## 2018-10-06 MED ORDER — LACTATED RINGERS IV SOLN: 10.0000 mL/h | INTRAVENOUS | Status: DC

## 2018-10-06 MED ORDER — NA HYALUR & NA CHOND-NA HYALUR 0.4-0.35 ML IO KIT
PACK | INTRAOCULAR | Status: DC | PRN
  Administered 2018-10-06: 1 mL via INTRAOCULAR

## 2018-10-06 MED ORDER — TETRACAINE HCL 0.5 % OP SOLN
1.0000 [drp] | OPHTHALMIC | Status: DC | PRN
  Administered 2018-10-06 (×2): 1 [drp] via OPHTHALMIC

## 2018-10-06 MED ORDER — MIDAZOLAM HCL 2 MG/2ML IJ SOLN
INTRAMUSCULAR | Status: DC | PRN
  Administered 2018-10-06: 2 mg via INTRAVENOUS

## 2018-10-06 MED ORDER — FENTANYL CITRATE (PF) 100 MCG/2ML IJ SOLN
INTRAMUSCULAR | Status: DC | PRN
  Administered 2018-10-06 (×2): 50 ug via INTRAVENOUS

## 2018-10-06 SURGICAL SUPPLY — 20 items
CANNULA ANT/CHMB 27G (MISCELLANEOUS) ×1 IMPLANT
CANNULA ANT/CHMB 27GA (MISCELLANEOUS) ×3 IMPLANT
GLOVE SURG LX 7.5 STRW (GLOVE) ×2
GLOVE SURG LX STRL 7.5 STRW (GLOVE) ×1 IMPLANT
GLOVE SURG TRIUMPH 8.0 PF LTX (GLOVE) ×3 IMPLANT
GOWN STRL REUS W/ TWL LRG LVL3 (GOWN DISPOSABLE) ×2 IMPLANT
GOWN STRL REUS W/TWL LRG LVL3 (GOWN DISPOSABLE) ×4
LENS IOL TECNIS ITEC 23.0 (Intraocular Lens) ×2 IMPLANT
MARKER SKIN DUAL TIP RULER LAB (MISCELLANEOUS) ×3 IMPLANT
NDL FILTER BLUNT 18X1 1/2 (NEEDLE) ×1 IMPLANT
NEEDLE FILTER BLUNT 18X 1/2SAF (NEEDLE) ×2
NEEDLE FILTER BLUNT 18X1 1/2 (NEEDLE) ×1 IMPLANT
PACK CATARACT BRASINGTON (MISCELLANEOUS) ×3 IMPLANT
PACK EYE AFTER SURG (MISCELLANEOUS) ×3 IMPLANT
PACK OPTHALMIC (MISCELLANEOUS) ×3 IMPLANT
SYR 3ML LL SCALE MARK (SYRINGE) ×3 IMPLANT
SYR 5ML LL (SYRINGE) ×3 IMPLANT
SYR TB 1ML LUER SLIP (SYRINGE) ×3 IMPLANT
WATER STERILE IRR 500ML POUR (IV SOLUTION) ×3 IMPLANT
WIPE NON LINTING 3.25X3.25 (MISCELLANEOUS) ×3 IMPLANT

## 2018-10-06 NOTE — Anesthesia Postprocedure Evaluation (Signed)
Anesthesia Post Note  Patient: [name] [name]  Procedure(s) Performed: CATARACT EXTRACTION PHACO AND INTRAOCULAR LENS PLACEMENT (IOC)  LEFT (Left Eye)  Patient location during evaluation: PACU Anesthesia Type: MAC Level of consciousness: awake and alert Pain management: pain level controlled Vital Signs Assessment: post-procedure vital signs reviewed and stable Respiratory status: spontaneous breathing, nonlabored ventilation, respiratory function stable and patient connected to nasal cannula oxygen Cardiovascular status: stable and blood pressure returned to baseline Postop Assessment: no apparent nausea or vomiting Anesthetic complications: no    Tao Satz

## 2018-10-06 NOTE — Anesthesia Preprocedure Evaluation (Signed)
Anesthesia Evaluation  Patient identified by MRN, date of birth, ID band Patient awake    Reviewed: Allergy & Precautions, NPO status , Patient's Chart, lab work & pertinent test results  History of Anesthesia Complications Negative for: history of anesthetic complications  Airway Mallampati: II  TM Distance: >3 FB Neck ROM: Full    Dental no notable dental hx.    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 male with  1. Essential hypertension  2. Sleep apnea, unspecified type  3. Pulmonary emphysema, unspecified emphysema type (CMS-HCC)  4. SOB (shortness of breath) on exertion  5. Hyperlipidemia, unspecified hyperlipidemia type  6. Encounter for medication management   72 year old gentleman with known coronary artery disease, currently without chest pain. Patient has chronic exertional dyspnea felt to be due to underlying COPD. 2D echocardiogram revealed normal left ventricular function, with LVEF greater than 55%. The patient has essential hypertension, systolic blood pressure mildly elevated on current BP medications. The patient has hyperlipidemia with excellent control of LDL cholesterol on simvastatin.   echo: 07/2017:  NORMAL LEFT VENTRICULAR SYSTOLIC FUNCTION WITH AN ESTIMATED EF = 55 % NORMAL RIGHT VENTRICULAR SYSTOLIC  FUNCTION MODERATE TRICUSPID VALVE INSUFFICIENCY MILD MITRAL VALVE INSUFFICIENCY NO VALVULAR STENOSIS MODERATE LVH MILD BIATRIAL ENLARGEMENT;  Reproductive/Obstetrics                             Anesthesia Physical  Anesthesia Plan  ASA: III  Anesthesia Plan: MAC   Post-op Pain Management:    Induction: Intravenous  PONV Risk Score and Plan:   Airway Management Planned: Natural Airway  Additional Equipment:   Intra-op Plan:   Post-operative Plan:   Informed Consent: I have reviewed the patients History and Physical, chart, labs and discussed the procedure including the risks, benefits and alternatives for the proposed anesthesia with the patient or authorized representative who has indicated his/her understanding and acceptance.     Plan Discussed with: CRNA  Anesthesia Plan Comments:         Anesthesia Quick Evaluation

## 2018-10-06 NOTE — H&P (Signed)
The History and Physical notes are on paper, have been signed, and are to be scanned. The patient remains stable and unchanged from the H&P.   Previous H&P reviewed, patient examined, and there are no changes.  Brittany Osier 10/06/2018 10:15 AM

## 2018-10-06 NOTE — Op Note (Signed)
OPERATIVE NOTE  [name] [name] [id number] 10/06/2018   PREOPERATIVE DIAGNOSIS:  Nuclear sclerotic cataract left eye. H25.12   POSTOPERATIVE DIAGNOSIS:    Nuclear sclerotic cataract left eye.     PROCEDURE:  Phacoemusification with posterior chamber intraocular lens placement of the left eye   LENS:   Implant Name Type Inv. Item Serial No. Manufacturer Lot No. LRB No. Used  LENS IOL DIOP 23.0 - [id number] Intraocular Lens LENS IOL DIOP 23.0 [id number] AMO  Left 1        ULTRASOUND TIME: 18  % of 1 minutes 4 seconds, CDE 11.2  SURGEON:  Wyonia Hough, MD   ANESTHESIA:  Topical with tetracaine drops and 2% Xylocaine jelly, augmented with 1% preservative-free intracameral lidocaine.    COMPLICATIONS:  None.   DESCRIPTION OF PROCEDURE:  The patient was identified in the holding room and transported to the operating room and placed in the supine position under the operating microscope.  The left eye was identified as the operative eye and it was prepped and draped in the usual sterile ophthalmic fashion.   A 1 millimeter clear-corneal paracentesis was made at the 1:30 position.  0.5 ml of preservative-free 1% lidocaine was injected into the anterior chamber.  The anterior chamber was filled with Viscoat viscoelastic.  A 2.4 millimeter keratome was used to make a near-clear corneal incision at the 10:30 position.  .  A curvilinear capsulorrhexis was made with a cystotome and capsulorrhexis forceps.  Balanced salt solution was used to hydrodissect and hydrodelineate the nucleus.   Phacoemulsification was then used in stop and chop fashion to remove the lens nucleus and epinucleus.  The remaining cortex was then removed using the irrigation and aspiration handpiece. Provisc was then placed into the capsular bag to distend it for lens placement.  A lens was then injected into the capsular bag.  The remaining viscoelastic was aspirated.   Wounds were hydrated with balanced salt  solution.  The anterior chamber was inflated to a physiologic pressure with balanced salt solution.  No wound leaks were noted. Cefuroxime 0.1 ml of a 10mg /ml solution was injected into the anterior chamber for a dose of 1 mg of intracameral antibiotic at the completion of the case.   Timolol and Brimonidine drops were applied to the eye.  The patient was taken to the recovery room in stable condition without complications of anesthesia or surgery.  [name] 10/06/2018, 11:02 AM

## 2018-10-06 NOTE — Transfer of Care (Signed)
Immediate Anesthesia Transfer of Care Note  Patient: [name] [name]  Procedure(s) Performed: CATARACT EXTRACTION PHACO AND INTRAOCULAR LENS PLACEMENT (IOC)  LEFT (Left Eye)  Patient Location: PACU  Anesthesia Type: MAC  Level of Consciousness: awake, alert  and patient cooperative  Airway and Oxygen Therapy: Patient Spontanous Breathing and Patient connected to supplemental oxygen  Post-op Assessment: Post-op Vital signs reviewed, Patient's Cardiovascular Status Stable, Respiratory Function Stable, Patent Airway and No signs of Nausea or vomiting  Post-op Vital Signs: Reviewed and stable  Complications: No apparent anesthesia complications

## 2018-10-07 ENCOUNTER — Encounter: Payer: Self-pay | Admitting: Ophthalmology

## 2018-11-18 ENCOUNTER — Other Ambulatory Visit: Payer: Self-pay | Admitting: Internal Medicine

## 2018-11-18 DIAGNOSIS — M5442 Lumbago with sciatica, left side: Principal | ICD-10-CM

## 2018-11-18 DIAGNOSIS — G8929 Other chronic pain: Secondary | ICD-10-CM

## 2018-11-18 DIAGNOSIS — M5441 Lumbago with sciatica, right side: Principal | ICD-10-CM

## 2018-11-26 ENCOUNTER — Inpatient Hospital Stay
Admission: EM | Admit: 2018-11-26 | Discharge: 2018-11-29 | DRG: 602 | Disposition: A | Discharge status: Home Health | Payer: Medicare Other | Attending: Internal Medicine | Admitting: Internal Medicine

## 2018-11-26 ENCOUNTER — Encounter: Payer: Self-pay | Admitting: Emergency Medicine

## 2018-11-26 ENCOUNTER — Other Ambulatory Visit: Payer: Self-pay

## 2018-11-26 DIAGNOSIS — Z8546 Personal history of malignant neoplasm of prostate: Secondary | ICD-10-CM

## 2018-11-26 DIAGNOSIS — F329 Major depressive disorder, single episode, unspecified: Secondary | ICD-10-CM | POA: Diagnosis present

## 2018-11-26 DIAGNOSIS — G4733 Obstructive sleep apnea (adult) (pediatric): Secondary | ICD-10-CM | POA: Diagnosis present

## 2018-11-26 DIAGNOSIS — R609 Edema, unspecified: Secondary | ICD-10-CM

## 2018-11-26 DIAGNOSIS — E7800 Pure hypercholesterolemia, unspecified: Secondary | ICD-10-CM | POA: Diagnosis present

## 2018-11-26 DIAGNOSIS — E890 Postprocedural hypothyroidism: Secondary | ICD-10-CM | POA: Diagnosis present

## 2018-11-26 DIAGNOSIS — I5033 Acute on chronic diastolic (congestive) heart failure: Secondary | ICD-10-CM | POA: Diagnosis present

## 2018-11-26 DIAGNOSIS — L03116 Cellulitis of left lower limb: Secondary | ICD-10-CM | POA: Diagnosis present

## 2018-11-26 DIAGNOSIS — L03115 Cellulitis of right lower limb: Secondary | ICD-10-CM | POA: Diagnosis not present

## 2018-11-26 DIAGNOSIS — I890 Lymphedema, not elsewhere classified: Secondary | ICD-10-CM | POA: Diagnosis present

## 2018-11-26 DIAGNOSIS — L03119 Cellulitis of unspecified part of limb: Secondary | ICD-10-CM

## 2018-11-26 DIAGNOSIS — F17290 Nicotine dependence, other tobacco product, uncomplicated: Secondary | ICD-10-CM | POA: Diagnosis present

## 2018-11-26 DIAGNOSIS — E782 Mixed hyperlipidemia: Secondary | ICD-10-CM | POA: Diagnosis present

## 2018-11-26 DIAGNOSIS — J441 Chronic obstructive pulmonary disease with (acute) exacerbation: Secondary | ICD-10-CM | POA: Diagnosis present

## 2018-11-26 DIAGNOSIS — Z85850 Personal history of malignant neoplasm of thyroid: Secondary | ICD-10-CM

## 2018-11-26 DIAGNOSIS — J449 Chronic obstructive pulmonary disease, unspecified: Secondary | ICD-10-CM | POA: Diagnosis present

## 2018-11-26 DIAGNOSIS — E785 Hyperlipidemia, unspecified: Secondary | ICD-10-CM | POA: Diagnosis present

## 2018-11-26 DIAGNOSIS — I10 Essential (primary) hypertension: Secondary | ICD-10-CM | POA: Diagnosis present

## 2018-11-26 DIAGNOSIS — I2510 Atherosclerotic heart disease of native coronary artery without angina pectoris: Secondary | ICD-10-CM | POA: Diagnosis present

## 2018-11-26 DIAGNOSIS — I5032 Chronic diastolic (congestive) heart failure: Secondary | ICD-10-CM | POA: Diagnosis present

## 2018-11-26 DIAGNOSIS — I110 Hypertensive heart disease with heart failure: Secondary | ICD-10-CM | POA: Diagnosis present

## 2018-11-26 DIAGNOSIS — Z66 Do not resuscitate: Secondary | ICD-10-CM | POA: Diagnosis present

## 2018-11-26 DIAGNOSIS — E039 Hypothyroidism, unspecified: Secondary | ICD-10-CM | POA: Diagnosis present

## 2018-11-26 DIAGNOSIS — Z9989 Dependence on other enabling machines and devices: Secondary | ICD-10-CM

## 2018-11-26 DIAGNOSIS — L0390 Cellulitis, unspecified: Secondary | ICD-10-CM | POA: Diagnosis present

## 2018-11-26 LAB — COMPREHENSIVE METABOLIC PANEL
ALT: 66 U/L — ABNORMAL HIGH (ref 0–44)
ANION GAP: 11 (ref 5–15)
AST: 47 U/L — ABNORMAL HIGH (ref 15–41)
Albumin: 3.9 g/dL (ref 3.5–5.0)
Alkaline Phosphatase: 92 U/L (ref 38–126)
BUN: 17 mg/dL (ref 8–23)
CHLORIDE: 100 mmol/L (ref 98–111)
CO2: 28 mmol/L (ref 22–32)
Calcium: 9.5 mg/dL (ref 8.9–10.3)
Creatinine, Ser: 1.01 mg/dL (ref 0.61–1.24)
GFR calc Af Amer: >60 mL/min (ref >60)
GFR calc non Af Amer: >60 mL/min (ref >60)
Glucose, Bld: 143 mg/dL — ABNORMAL HIGH (ref 70–99)
Potassium: 4.1 mmol/L (ref 3.5–5.1)
Sodium: 139 mmol/L (ref 135–145)
Total Bilirubin: 1 mg/dL (ref 0.3–1.2)
Total Protein: 7.1 g/dL (ref 6.5–8.1)

## 2018-11-26 LAB — URINALYSIS, COMPLETE (UACMP) WITH MICROSCOPIC
Bacteria, UA: NONE SEEN
Bilirubin Urine: NEGATIVE
Glucose, UA: NEGATIVE mg/dL
Hgb urine dipstick: NEGATIVE
Ketones, ur: NEGATIVE mg/dL
LEUKOCYTES UA: NEGATIVE
Nitrite: NEGATIVE
Protein, ur: NEGATIVE mg/dL
Specific Gravity, Urine: 1.003 — ABNORMAL LOW (ref 1.005–1.030)
Squamous Epithelial / HPF: NONE SEEN (ref 0–5)
WBC UA: NONE SEEN WBC/hpf (ref 0–5)
pH: 7 (ref 5.0–8.0)

## 2018-11-26 LAB — CBC
HCT: 44.7 % (ref 39.0–52.0)
Hemoglobin: 14.8 g/dL (ref 13.0–17.0)
MCH: 31.2 pg (ref 26.0–34.0)
MCHC: 33.1 g/dL (ref 30.0–36.0)
MCV: 94.3 fL (ref 80.0–100.0)
Platelets: 227 10*3/uL (ref 150–400)
RBC: 4.74 MIL/uL (ref 4.22–5.81)
RDW: 13.9 % (ref 11.5–15.5)
WBC: 10 10*3/uL (ref 4.0–10.5)
nRBC: 0 % (ref 0.0–0.2)

## 2018-11-26 NOTE — ED Notes (Signed)
First RN note: Pt arrives to ED via [facility] from home with c/o bilateral leg swelling "x6 months". EMS reports all vital signs stable and pt was ambulatory to ambulance upon their arrival.

## 2018-11-26 NOTE — ED Triage Notes (Signed)
Pt reports bilaterally swelling to lower extremities that increased this  evening, visible swelling, pitting edema, visible redness on legs more to right lower extremity, redness noted to lef lower extremity, tender to touch. Pt talks in complete sentences no respiratory distress noted. Pt reports took 40 mg of lasix, prior to arrival

## 2018-11-27 ENCOUNTER — Inpatient Hospital Stay
Admit: 2018-11-27 | Discharge: 2018-11-27 | Disposition: A | Discharge status: Home / Self Care | Payer: Medicare Other | Attending: Internal Medicine | Admitting: Internal Medicine

## 2018-11-27 ENCOUNTER — Emergency Department: Payer: Medicare Other

## 2018-11-27 DIAGNOSIS — I5033 Acute on chronic diastolic (congestive) heart failure: Secondary | ICD-10-CM | POA: Diagnosis present

## 2018-11-27 DIAGNOSIS — I890 Lymphedema, not elsewhere classified: Secondary | ICD-10-CM | POA: Diagnosis present

## 2018-11-27 DIAGNOSIS — E782 Mixed hyperlipidemia: Secondary | ICD-10-CM | POA: Diagnosis present

## 2018-11-27 DIAGNOSIS — I10 Essential (primary) hypertension: Secondary | ICD-10-CM | POA: Diagnosis present

## 2018-11-27 DIAGNOSIS — Z85850 Personal history of malignant neoplasm of thyroid: Secondary | ICD-10-CM | POA: Diagnosis not present

## 2018-11-27 DIAGNOSIS — I110 Hypertensive heart disease with heart failure: Secondary | ICD-10-CM | POA: Diagnosis present

## 2018-11-27 DIAGNOSIS — E7800 Pure hypercholesterolemia, unspecified: Secondary | ICD-10-CM | POA: Diagnosis present

## 2018-11-27 DIAGNOSIS — J449 Chronic obstructive pulmonary disease, unspecified: Secondary | ICD-10-CM | POA: Diagnosis present

## 2018-11-27 DIAGNOSIS — I5032 Chronic diastolic (congestive) heart failure: Secondary | ICD-10-CM | POA: Diagnosis present

## 2018-11-27 DIAGNOSIS — G4733 Obstructive sleep apnea (adult) (pediatric): Secondary | ICD-10-CM | POA: Diagnosis present

## 2018-11-27 DIAGNOSIS — F329 Major depressive disorder, single episode, unspecified: Secondary | ICD-10-CM | POA: Diagnosis present

## 2018-11-27 DIAGNOSIS — E785 Hyperlipidemia, unspecified: Secondary | ICD-10-CM | POA: Diagnosis present

## 2018-11-27 DIAGNOSIS — E039 Hypothyroidism, unspecified: Secondary | ICD-10-CM | POA: Diagnosis present

## 2018-11-27 DIAGNOSIS — Z9989 Dependence on other enabling machines and devices: Secondary | ICD-10-CM | POA: Diagnosis not present

## 2018-11-27 DIAGNOSIS — F17290 Nicotine dependence, other tobacco product, uncomplicated: Secondary | ICD-10-CM | POA: Diagnosis present

## 2018-11-27 DIAGNOSIS — L03116 Cellulitis of left lower limb: Secondary | ICD-10-CM | POA: Diagnosis present

## 2018-11-27 DIAGNOSIS — I2510 Atherosclerotic heart disease of native coronary artery without angina pectoris: Secondary | ICD-10-CM | POA: Diagnosis present

## 2018-11-27 DIAGNOSIS — E890 Postprocedural hypothyroidism: Secondary | ICD-10-CM | POA: Diagnosis present

## 2018-11-27 DIAGNOSIS — L03115 Cellulitis of right lower limb: Secondary | ICD-10-CM | POA: Diagnosis present

## 2018-11-27 DIAGNOSIS — Z66 Do not resuscitate: Secondary | ICD-10-CM | POA: Diagnosis present

## 2018-11-27 DIAGNOSIS — L0390 Cellulitis, unspecified: Secondary | ICD-10-CM | POA: Diagnosis present

## 2018-11-27 DIAGNOSIS — Z8546 Personal history of malignant neoplasm of prostate: Secondary | ICD-10-CM | POA: Diagnosis not present

## 2018-11-27 LAB — CBC
HEMATOCRIT: 43.3 % (ref 39.0–52.0)
Hemoglobin: 14.3 g/dL (ref 13.0–17.0)
MCH: 31.4 pg (ref 26.0–34.0)
MCHC: 33 g/dL (ref 30.0–36.0)
MCV: 95 fL (ref 80.0–100.0)
Platelets: 221 10*3/uL (ref 150–400)
RBC: 4.56 MIL/uL (ref 4.22–5.81)
RDW: 14 % (ref 11.5–15.5)
WBC: 8.5 10*3/uL (ref 4.0–10.5)
nRBC: 0 % (ref 0.0–0.2)

## 2018-11-27 LAB — ECHOCARDIOGRAM COMPLETE
Height: 74 in
Weight: 4945.6 oz

## 2018-11-27 LAB — BASIC METABOLIC PANEL
Anion gap: 11 (ref 5–15)
BUN: 18 mg/dL (ref 8–23)
CO2: 29 mmol/L (ref 22–32)
CREATININE: 1.1 mg/dL (ref 0.61–1.24)
Calcium: 9.3 mg/dL (ref 8.9–10.3)
Chloride: 99 mmol/L (ref 98–111)
GFR calc Af Amer: >60 mL/min (ref >60)
GFR calc non Af Amer: >60 mL/min (ref >60)
GLUCOSE: 131 mg/dL — AB (ref 70–99)
Potassium: 3.6 mmol/L (ref 3.5–5.1)
Sodium: 139 mmol/L (ref 135–145)

## 2018-11-27 MED ORDER — FUROSEMIDE 10 MG/ML IJ SOLN
40.0000 mg | Freq: Once | INTRAMUSCULAR | Status: AC
  Administered 2018-11-27: 40 mg via INTRAVENOUS
  Filled 2018-11-27: qty 4

## 2018-11-27 MED ORDER — LEVOTHYROXINE SODIUM 100 MCG PO TABS
200.0000 ug | ORAL_TABLET | Freq: Every day | ORAL | Status: DC
  Administered 2018-11-27 – 2018-11-29 (×3): 200 ug via ORAL
  Filled 2018-11-27 (×3): qty 2

## 2018-11-27 MED ORDER — ALPRAZOLAM 0.25 MG PO TABS
0.2500 mg | ORAL_TABLET | Freq: Two times a day (BID) | ORAL | Status: DC | PRN
  Administered 2018-11-27 – 2018-11-28 (×2): 0.25 mg via ORAL
  Filled 2018-11-27 (×2): qty 1

## 2018-11-27 MED ORDER — SODIUM CHLORIDE 0.9% FLUSH
3.0000 mL | Freq: Two times a day (BID) | INTRAVENOUS | Status: DC
  Administered 2018-11-27 – 2018-11-29 (×5): 3 mL via INTRAVENOUS

## 2018-11-27 MED ORDER — CYCLOBENZAPRINE HCL 10 MG PO TABS
5.0000 mg | ORAL_TABLET | Freq: Three times a day (TID) | ORAL | Status: DC | PRN
  Administered 2018-11-27 – 2018-11-29 (×4): 5 mg via ORAL
  Filled 2018-11-27 (×4): qty 1

## 2018-11-27 MED ORDER — ONDANSETRON HCL 4 MG/2ML IJ SOLN: 4.0000 mg | Freq: Four times a day (QID) | INTRAMUSCULAR | Status: DC | PRN

## 2018-11-27 MED ORDER — SODIUM CHLORIDE 0.9 % IV SOLN
INTRAVENOUS | Status: DC | PRN
  Administered 2018-11-27 (×2): 500 mL via INTRAVENOUS

## 2018-11-27 MED ORDER — LOSARTAN POTASSIUM-HCTZ 50-12.5 MG PO TABS: 1.0000 | ORAL_TABLET | Freq: Every day | ORAL | Status: DC

## 2018-11-27 MED ORDER — ZOLPIDEM TARTRATE 5 MG PO TABS
5.0000 mg | ORAL_TABLET | Freq: Every evening | ORAL | Status: DC | PRN
  Administered 2018-11-29: 5 mg via ORAL
  Filled 2018-11-27: qty 1

## 2018-11-27 MED ORDER — SIMVASTATIN 20 MG PO TABS
40.0000 mg | ORAL_TABLET | Freq: Every day | ORAL | Status: DC
  Administered 2018-11-27 – 2018-11-29 (×3): 40 mg via ORAL
  Filled 2018-11-27 (×3): qty 2

## 2018-11-27 MED ORDER — CLINDAMYCIN PHOSPHATE 600 MG/50ML IV SOLN
600.0000 mg | Freq: Three times a day (TID) | INTRAVENOUS | Status: DC
  Administered 2018-11-27 – 2018-11-29 (×7): 600 mg via INTRAVENOUS
  Filled 2018-11-27 (×9): qty 50

## 2018-11-27 MED ORDER — ACETAMINOPHEN 650 MG RE SUPP: 650.0000 mg | Freq: Four times a day (QID) | RECTAL | Status: DC | PRN

## 2018-11-27 MED ORDER — SODIUM CHLORIDE 0.9% FLUSH
3.0000 mL | INTRAVENOUS | Status: DC | PRN
  Administered 2018-11-27: 3 mL via INTRAVENOUS
  Filled 2018-11-27: qty 3

## 2018-11-27 MED ORDER — HYDROCHLOROTHIAZIDE 12.5 MG PO CAPS
12.5000 mg | ORAL_CAPSULE | Freq: Every day | ORAL | Status: DC
  Administered 2018-11-27 – 2018-11-29 (×3): 12.5 mg via ORAL
  Filled 2018-11-27 (×3): qty 1

## 2018-11-27 MED ORDER — ASPIRIN EC 81 MG PO TBEC
81.0000 mg | DELAYED_RELEASE_TABLET | Freq: Every day | ORAL | Status: DC
  Administered 2018-11-27 – 2018-11-29 (×3): 81 mg via ORAL
  Filled 2018-11-27 (×3): qty 1

## 2018-11-27 MED ORDER — MOMETASONE FURO-FORMOTEROL FUM 200-5 MCG/ACT IN AERO
2.0000 | INHALATION_SPRAY | Freq: Two times a day (BID) | RESPIRATORY_TRACT | Status: DC
  Administered 2018-11-27 – 2018-11-29 (×5): 2 via RESPIRATORY_TRACT
  Filled 2018-11-27: qty 8.8

## 2018-11-27 MED ORDER — VERAPAMIL HCL ER 180 MG PO TBCR
180.0000 mg | EXTENDED_RELEASE_TABLET | Freq: Two times a day (BID) | ORAL | Status: DC
  Administered 2018-11-27 – 2018-11-29 (×6): 180 mg via ORAL
  Filled 2018-11-27 (×8): qty 1

## 2018-11-27 MED ORDER — ACETAMINOPHEN 325 MG PO TABS
650.0000 mg | ORAL_TABLET | Freq: Four times a day (QID) | ORAL | Status: DC | PRN
  Administered 2018-11-29: 650 mg via ORAL
  Filled 2018-11-27: qty 2

## 2018-11-27 MED ORDER — LOSARTAN POTASSIUM 50 MG PO TABS
50.0000 mg | ORAL_TABLET | Freq: Every day | ORAL | Status: DC
  Administered 2018-11-27 – 2018-11-29 (×3): 50 mg via ORAL
  Filled 2018-11-27 (×3): qty 1

## 2018-11-27 MED ORDER — GABAPENTIN 100 MG PO CAPS
200.0000 mg | ORAL_CAPSULE | Freq: Three times a day (TID) | ORAL | Status: DC
  Administered 2018-11-27 – 2018-11-29 (×7): 200 mg via ORAL
  Filled 2018-11-27 (×7): qty 2

## 2018-11-27 MED ORDER — CLINDAMYCIN PHOSPHATE 600 MG/50ML IV SOLN
600.0000 mg | Freq: Once | INTRAVENOUS | Status: AC
  Administered 2018-11-27: 600 mg via INTRAVENOUS
  Filled 2018-11-27: qty 50

## 2018-11-27 MED ORDER — FLUOXETINE HCL 20 MG PO CAPS
40.0000 mg | ORAL_CAPSULE | Freq: Every day | ORAL | Status: DC
  Administered 2018-11-27 – 2018-11-29 (×3): 40 mg via ORAL
  Filled 2018-11-27 (×3): qty 2

## 2018-11-27 MED ORDER — OXYCODONE-ACETAMINOPHEN 5-325 MG PO TABS
1.0000 | ORAL_TABLET | Freq: Four times a day (QID) | ORAL | Status: DC | PRN
  Administered 2018-11-27 – 2018-11-29 (×5): 1 via ORAL
  Filled 2018-11-27 (×5): qty 1

## 2018-11-27 MED ORDER — ENOXAPARIN SODIUM 40 MG/0.4ML ~~LOC~~ SOLN
40.0000 mg | SUBCUTANEOUS | Status: DC
  Administered 2018-11-27 – 2018-11-28 (×2): 40 mg via SUBCUTANEOUS
  Filled 2018-11-27 (×2): qty 0.4

## 2018-11-27 MED ORDER — ONDANSETRON HCL 4 MG PO TABS: 4.0000 mg | ORAL_TABLET | Freq: Four times a day (QID) | ORAL | Status: DC | PRN

## 2018-11-27 MED ORDER — MORPHINE SULFATE (PF) 2 MG/ML IV SOLN: 2.0000 mg | INTRAVENOUS | Status: DC | PRN

## 2018-11-27 MED ORDER — FUROSEMIDE 20 MG PO TABS
20.0000 mg | ORAL_TABLET | Freq: Every day | ORAL | Status: DC
  Administered 2018-11-27 – 2018-11-29 (×3): 20 mg via ORAL
  Filled 2018-11-27 (×4): qty 1

## 2018-11-27 NOTE — Plan of Care (Signed)
  Problem: Education: Goal: Knowledge of General Education information will improve Description Including pain rating scale, medication(s)/side effects and non-pharmacologic comfort measures Outcome: Progressing   Problem: Clinical Measurements: Goal: Will remain free from infection Outcome: Progressing Goal: Respiratory complications will improve Outcome: Progressing   Problem: Skin Integrity: Goal: Skin integrity will improve Outcome: Progressing   Problem: Education: Goal: Ability to demonstrate management of disease process will improve Outcome: Progressing Goal: Ability to verbalize understanding of medication therapies will improve Outcome: Progressing   Problem: Activity: Goal: Capacity to carry out activities will improve Outcome: Progressing

## 2018-11-27 NOTE — Progress Notes (Signed)
*  PRELIMINARY RESULTS* Echocardiogram 2D Echocardiogram has been performed.  [name] [name] 11/27/2018, 11:27 AM 

## 2018-11-27 NOTE — Progress Notes (Signed)
Ace wraps applied to both lower legs.  From his toes to just below his knees.

## 2018-11-27 NOTE — H&P (Signed)
[name] NAME: [name] [name]    MR#:  [id number]  DATE OF BIRTH:  09-11-1946  DATE OF ADMISSION:  11/26/2018  PRIMARY CARE PHYSICIAN: Idelle Crouch, MD   REQUESTING/REFERRING PHYSICIAN: Owens Shark, MD  CHIEF COMPLAINT:   Chief Complaint  Patient presents with  . Leg Swelling    Billaterally  . Leg Pain    Bilalterally    HISTORY OF PRESENT ILLNESS:  [name] [name]  is a 73 y.o. male who presents with chief complaint as above.  Patient states that he was recently taken off of his Lasix dose due to concern for worsening renal function.  He states that over the last several weeks his lower extremity edema has gotten worse.  The past couple of days he developed erythema and tenderness in his legs.  In the ED tonight he appears to have bilateral lower extremity cellulitis.  Hospitalist called for admission and further evaluation  PAST MEDICAL HISTORY:   Past Medical History:  Diagnosis Date  . Anxiety   . Anxiety disorder 05/05/2014   unspecifed  . Cancer [facility])    prostate cancer   . Colonic polyp   . COPD (chronic obstructive pulmonary disease) ([place])   . Depression   . Dyspnea   . Elevated prostate specific antigen (PSA)   . Essential (primary) hypertension 05/05/2014  . 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 GI bleed    due to diverticulitis   . Heart murmur    hx of 1960s  . History of thyroid cancer 05/05/2014  . Hypercholesteremia   . Hyperlipidemia   . Hypertension   . Hypothyroidism 05/05/2014  . Impotence of organic origin   . Male erectile dysfunction 12/29/2012  . Malignant neoplasm of prostate ([place])    10/30/2011 T1c - Identified by needle biopsy . Gleason 7 (3+4) 5 of 12 cores, Bilateral   . Prediabetes 06/22/2017  . Prostate cancer ([place])   . Shortness of breath on exertion 07/11/2015  . Sleep apnea    CPAP , report on chart , not used in last 2 weeks   . Thyroid cancer ([place])   . Thyroid  disease   . Urinary obstruction    not elsewhere classified     PAST SURGICAL HISTORY:   Past Surgical History:  Procedure Laterality Date  . BACK SURGERY     lumbar 1987   . BACK SURGERY     C-6- Plate&Pin  . CATARACT EXTRACTION W/PHACO Right 09/15/2018   Procedure: CATARACT EXTRACTION PHACO AND INTRAOCULAR LENS PLACEMENT ([place]) RIGHT;  Surgeon: Leandrew Koyanagi, MD;  Location: [place];  Service: Ophthalmology;  Laterality: Right;  Requests to be last case  . CATARACT EXTRACTION W/PHACO Left 10/06/2018   Procedure: CATARACT EXTRACTION PHACO AND INTRAOCULAR LENS PLACEMENT ([place])  LEFT;  Surgeon: Leandrew Koyanagi, MD;  Location: [facility];  Service: Ophthalmology;  Laterality: Left;  . CERVICAL DISC SURGERY    . COLONOSCOPY     10/06/2011, 06/05/2006, 05/06/2002 adematous polyps: CBF 09/2014; Recall Ltr mailed 02/27/2015 (dw)  . COLONOSCOPY WITH PROPOFOL N/A 07/06/2015   Procedure: COLONOSCOPY WITH PROPOFOL;  Surgeon: Manya Silvas, MD;  Location: [facility] ENDOSCOPY;  Service: Endoscopy;  Laterality: N/A;  . COLONOSCOPY WITH PROPOFOL N/A 09/01/2018   Procedure: COLONOSCOPY WITH PROPOFOL;  Surgeon: Manya Silvas, MD;  Location: [facility] ENDOSCOPY;  Service: Endoscopy;  Laterality: N/A;  . HERNIA REPAIR     umbilical hernia repair   .  LAMINECTOMY FOR EXCISION / EVACUATION INTRASPINAL LESION     lumbar  . OTHER SURGICAL HISTORY     right rotator cuff surgery   . OTHER SURGICAL HISTORY     bilateral tubes in ears   . POSTERIOR LAMINECTOMY / DECOMPRESSION CERVICAL SPINE    . PROSTATE BIOPSY     10/22/2011 Volume:55.8 cc's, PSA:4.5, Free PSA:12%  . ROBOT ASSISTED LAPAROSCOPIC RADICAL PROSTATECTOMY  12/15/2011   Procedure: ROBOTIC ASSISTED LAPAROSCOPIC RADICAL PROSTATECTOMY LEVEL 2;  Surgeon: Dutch Gray, MD;  Location: [facility];  Service: Urology;  Laterality: N/A;      . ROTATOR CUFF REPAIR Right   . TONSILLECTOMY    . TOTAL THYROIDECTOMY       SOCIAL  HISTORY:   Social History   Tobacco Use  . Smoking status: Current Every Day Smoker    Packs/day: 1.00    Years: 35.00    Pack years: 35.00  . Smokeless tobacco: Current User  . Tobacco comment: Vapor  Substance Use Topics  . Alcohol use: Yes    Alcohol/week: 2.0 standard drinks    Types: 1 Glasses of wine, 1 Shots of liquor per week     FAMILY HISTORY:   Family History  Problem Relation Age of Onset  . Hypertension Mother   . Anxiety disorder Mother   . Stroke Mother        "mini strokes"  . Coronary artery disease Mother   . Heart attack Mother   . Emphysema Father   . Heart disease Father   . Scoliosis Father   . Coronary artery disease Father   . GU problems Neg Hx   . Kidney disease Neg Hx   . Prostate cancer Neg Hx      DRUG ALLERGIES:  No Known Allergies  MEDICATIONS AT HOME:   Prior to Admission medications   Medication Sig Start Date End Date Taking? Authorizing Provider  acetaminophen (TYLENOL) 325 MG tablet Take 650 mg by mouth 3 (three) times daily.    Yes [provider]  albuterol (VENTOLIN HFA) 108 (90 Base) MCG/ACT inhaler Inhale 2 puffs into the lungs every 6 (six) hours as needed for wheezing or shortness of breath.   Yes [provider]  aspirin EC 81 MG tablet Take 81 mg by mouth daily.    Yes [provider]  budesonide-formoterol (SYMBICORT) 160-4.5 MCG/ACT inhaler Inhale 2 puffs into the lungs 2 (two) times daily.    Yes [provider]  diazepam (VALIUM) 5 MG tablet Take 5 mg by mouth every 6 (six) hours as needed for muscle spasms.    Yes [provider]  Eszopiclone (ESZOPICLONE) 3 MG TABS Take 3 mg by mouth at bedtime as needed (sleep).    Yes [provider]  FLUoxetine (PROZAC) 40 MG capsule Take 40 mg by mouth daily.   Yes [provider]  gabapentin (NEURONTIN) 100 MG capsule Take 200 mg by mouth 3 (three) times daily.   Yes [provider]  ipratropium-albuterol  (DUONEB) 0.5-2.5 (3) MG/3ML SOLN Take 3 mLs by nebulization every 6 (six) hours as needed (Shortness of breath, wheezing.). 02/10/18  Yes Sainani, Belia Heman, MD  levothyroxine (SYNTHROID, LEVOTHROID) 200 MCG tablet Take 200 mcg by mouth daily before breakfast.   Yes [provider]  losartan-hydrochlorothiazide (HYZAAR) 50-12.5 MG per tablet Take 1 tablet by mouth daily.    Yes [provider]  simvastatin (ZOCOR) 40 MG tablet Take 40 mg by mouth daily.   Yes  [provider]  verapamil (CALAN-SR) 180 MG CR tablet Take 180 mg by mouth 2 (two) times daily.    Yes [provider]  furosemide (LASIX) 40 MG tablet Take 40 mg by mouth once a week.     [provider]    REVIEW OF SYSTEMS:  Review of Systems  Constitutional: Negative for chills, fever, malaise/fatigue and weight loss.  HENT: Negative for ear pain, hearing loss and tinnitus.   Eyes: Negative for blurred vision, double vision, pain and redness.  Respiratory: Negative for cough, hemoptysis and shortness of breath.   Cardiovascular: Positive for leg swelling. Negative for chest pain, palpitations and orthopnea.  Gastrointestinal: Negative for abdominal pain, constipation, diarrhea, nausea and vomiting.  Genitourinary: Negative for dysuria, frequency and hematuria.  Musculoskeletal: Negative for back pain, joint pain and neck pain.  Skin:       Lateral lower extremity erythema  Neurological: Negative for dizziness, tremors, focal weakness and weakness.  Endo/Heme/Allergies: Negative for polydipsia. Does not bruise/bleed easily.  Psychiatric/Behavioral: Negative for depression. The patient is not nervous/anxious and does not have insomnia.      VITAL SIGNS:   Vitals:   11/26/18 2157 11/26/18 2206 11/26/18 2211  BP: (!) 145/84 (!) 145/84   Pulse: 69 67   Resp: 16 16   Temp: 97.9 F (36.6 C) 97.9 F (36.6 C)   TempSrc: Oral Oral   SpO2: 97% 98%   Weight:   136.1 kg  Height:   6\' 2"   (1.88 m)   Wt Readings from Last 3 Encounters:  11/26/18 136.1 kg  10/06/18 133.4 kg  09/15/18 132 kg    PHYSICAL EXAMINATION:  Physical Exam  Vitals reviewed. Constitutional: He is oriented to person, place, and time. He appears well-developed and well-nourished. No distress.  HENT:  Head: Normocephalic and atraumatic.  Mouth/Throat: Oropharynx is clear and moist.  Eyes: Pupils are equal, round, and reactive to light. Conjunctivae and EOM are normal. No scleral icterus.  Neck: Normal range of motion. Neck supple. No JVD present. No thyromegaly present.  Cardiovascular: Normal rate, regular rhythm and intact distal pulses. Exam reveals no gallop and no friction rub.  No murmur heard. Respiratory: Effort normal and breath sounds normal. No respiratory distress. He has no wheezes. He has no rales.  GI: Soft. Bowel sounds are normal. He exhibits no distension. There is no abdominal tenderness.  Musculoskeletal: Normal range of motion.        General: Edema present.     Comments: No arthritis, no gout  Lymphadenopathy:    He has no cervical adenopathy.  Neurological: He is alert and oriented to person, place, and time. No cranial nerve deficit.  No dysarthria, no aphasia  Skin: Skin is warm and dry. No rash noted. There is erythema (Bilateral lower extremities).  Psychiatric: He has a normal mood and affect. His behavior is normal. Judgment and thought content normal.    LABORATORY PANEL:   CBC Recent Labs  Lab 11/26/18 2213  WBC 10.0  HGB 14.8  HCT 44.7  PLT 227   ------------------------------------------------------------------------------------------------------------------  Chemistries  Recent Labs  Lab 11/26/18 2213  NA 139  K 4.1  CL 100  CO2 28  GLUCOSE 143*  BUN 17  CREATININE 1.01  CALCIUM 9.5  AST 47*  ALT 66*  ALKPHOS 92  BILITOT 1.0    ------------------------------------------------------------------------------------------------------------------  Cardiac Enzymes No results for input(s): TROPONINI in the last 168 hours. ------------------------------------------------------------------------------------------------------------------  RADIOLOGY:  Dg Chest Portable 1 View  Result Date: 11/27/2018 CLINICAL DATA:  Dyspnea EXAM: PORTABLE CHEST 1 VIEW COMPARISON:  02/07/2018, 12/11/2011 FINDINGS: Cardiomegaly without overt edema. Chronic hazy and streaky bibasilar opacity. No pleural effusion. No pneumothorax. IMPRESSION: No active disease. Cardiomegaly with chronic streaky and hazy basilar opacities. Electronically Signed   By: Donavan Foil M.D.   On: 11/27/2018 00:52    EKG:   Orders placed or performed during the hospital encounter of 02/07/18  . EKG 12-Lead  . EKG 12-Lead  . ED EKG  . ED EKG  . EKG    IMPRESSION AND PLAN:  Principal Problem:   Cellulitis -antibiotics initiated Active Problems:   Acute on chronic diastolic CHF (congestive heart failure) (HCC) -IV Lasix given in the ED, another dose scheduled for early morning.  We will get echocardiogram and cardiology consult   COPD (chronic obstructive pulmonary disease) ([place]) -continue home meds   HTN (hypertension) -home dose antihypertensives   Major depressive disorder -home dose antidepressant   Hypothyroidism -home dose thyroid replacement   OSA (obstructive sleep apnea) -CPAP nightly   HLD (hyperlipidemia) -Home dose antilipid  Chart review performed and case discussed with ED provider. Labs, imaging and/or ECG reviewed by provider and discussed with patient/family. Management plans discussed with the patient and/or family.  DVT PROPHYLAXIS: SubQ lovenox   GI PROPHYLAXIS:  None  ADMISSION STATUS: Inpatient     CODE STATUS: Full Code Status History    Date Active Date Inactive Code Status Order ID Comments User Context   02/07/2018 1419  02/10/2018 2126 Full Code [id number]  Epifanio Lesches, MD ED    Advance Directive Documentation     Most Recent Value  Type of Advance Directive  Healthcare Power of Attorney  Pre-existing out of facility DNR order (yellow form or pink MOST form)  -  "MOST" Form in Place?  -      TOTAL TIME TAKING CARE OF THIS PATIENT: 45 minutes.   Shelisha Gautier [place] 11/27/2018, 1:12 AM  [employer]  [phone]  CC: Primary care physician; Idelle Crouch, MD  Note:  This document was prepared using Dragon voice recognition software and may include unintentional dictation errors.

## 2018-11-27 NOTE — Plan of Care (Signed)
Areas of inflammation not progressing.  Areas of redness within the delineation marked last night

## 2018-11-27 NOTE — ED Provider Notes (Addendum)
[facility] Emergency Department Provider Note   First MD Initiated Contact with Patient 11/27/18 0109     (approximate)  I have reviewed the triage vital signs and the nursing notes.   HISTORY  Chief Complaint Leg Swelling (Billaterally) and Leg Pain (Bilalterally)    HPI [name] [name] is a 73 y.o. male with below list of chronic medical conditions presents to the emergency department with increasing bilateral lower extremity edema x1 week with noted bilateral lower extremity redness and pain that is hot to the touch for the past day.  Patient denies any fever afebrile on presentation with temperature is 97.9.  Patient states that his primary care provider Dr. Doy Hutching discontinued his Lasix for 14 days and while initially his legs were not increasing in swelling and they subsequently became quite swollen.  Patient also admits to dyspnea and orthopnea.   Past Medical History:  Diagnosis Date  . Anxiety   . Anxiety disorder 05/05/2014   unspecifed  . Cancer [facility])    prostate cancer   . Colonic polyp   . COPD (chronic obstructive pulmonary disease) ([place])   . Depression   . Dyspnea   . Elevated prostate specific antigen (PSA)   . Essential (primary) hypertension 05/05/2014  . 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 GI bleed    due to diverticulitis   . Heart murmur    hx of 1960s  . History of thyroid cancer 05/05/2014  . Hypercholesteremia   . Hyperlipidemia   . Hypertension   . Hypothyroidism 05/05/2014  . Impotence of organic origin   . Male erectile dysfunction 12/29/2012  . Malignant neoplasm of prostate ([place])    10/30/2011 T1c - Identified by needle biopsy . Gleason 7 (3+4) 5 of 12 cores, Bilateral   . Prediabetes 06/22/2017  . Prostate cancer ([place])   . Shortness of breath on exertion 07/11/2015  . Sleep apnea    CPAP , report on chart , not used in last 2 weeks   . Thyroid cancer ([place])   . Thyroid disease   . Urinary obstruction    not elsewhere classified    Patient Active Problem List   Diagnosis Date Noted  . Acute on chronic diastolic CHF (congestive heart failure) ([place]) 11/27/2018  . Cellulitis 11/27/2018  . Hypothyroidism 11/27/2018  . OSA (obstructive sleep apnea) 11/27/2018  . HTN (hypertension) 11/27/2018  . HLD (hyperlipidemia) 11/27/2018  . Protein-calorie malnutrition ([place]) 03/02/2018  . Neuropathy 03/02/2018  . 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  . Difficulty in voiding 03/01/2018  . Vitamin D deficiency 03/01/2018  . Vitamin B12 deficiency 03/01/2018  . Insomnia 03/01/2018  . COPD (chronic obstructive pulmonary disease) ([place]) 02/11/2018  . Pain due to onychomycosis of nail 02/11/2018  . Onychomycosis 02/11/2018  . Traumatic ecchymosis of foot, right, initial encounter 02/11/2018  . Xerostomia 02/11/2018  . Frequent falls 02/11/2018  . Acute respiratory failure with hypoxemia ([place]) 02/07/2018    Past Surgical History:  Procedure Laterality Date  . BACK SURGERY     lumbar 1987   . BACK SURGERY     C-6- Plate&Pin  . CATARACT EXTRACTION W/PHACO Right 09/15/2018   Procedure: CATARACT EXTRACTION PHACO AND INTRAOCULAR LENS PLACEMENT ([place]) RIGHT;  Surgeon: Leandrew Koyanagi, MD;  Location: [place];  Service: Ophthalmology;  Laterality: Right;  Requests to be last case  . CATARACT EXTRACTION W/PHACO Left 10/06/2018   Procedure: CATARACT EXTRACTION PHACO AND INTRAOCULAR LENS PLACEMENT ([place])  LEFT;  Surgeon: Leandrew Koyanagi, MD;  Location: [place];  Service: Ophthalmology;  Laterality: Left;  . CERVICAL DISC SURGERY    . COLONOSCOPY     10/06/2011, 06/05/2006, 05/06/2002 adematous polyps: CBF 09/2014; Recall Ltr mailed 02/27/2015 (dw)  . COLONOSCOPY WITH PROPOFOL N/A 07/06/2015   Procedure: COLONOSCOPY WITH PROPOFOL;  Surgeon: Manya Silvas, MD;  Location: [facility] ENDOSCOPY;  Service: Endoscopy;  Laterality: N/A;  . COLONOSCOPY WITH PROPOFOL N/A 09/01/2018   Procedure:  COLONOSCOPY WITH PROPOFOL;  Surgeon: Manya Silvas, MD;  Location: [facility] ENDOSCOPY;  Service: Endoscopy;  Laterality: N/A;  . HERNIA REPAIR     umbilical hernia repair   . LAMINECTOMY FOR EXCISION / EVACUATION INTRASPINAL LESION     lumbar  . OTHER SURGICAL HISTORY     right rotator cuff surgery   . OTHER SURGICAL HISTORY     bilateral tubes in ears   . POSTERIOR LAMINECTOMY / DECOMPRESSION CERVICAL SPINE    . PROSTATE BIOPSY     10/22/2011 Volume:55.8 cc's, PSA:4.5, Free PSA:12%  . ROBOT ASSISTED LAPAROSCOPIC RADICAL PROSTATECTOMY  12/15/2011   Procedure: ROBOTIC ASSISTED LAPAROSCOPIC RADICAL PROSTATECTOMY LEVEL 2;  Surgeon: Dutch Gray, MD;  Location: [facility];  Service: Urology;  Laterality: N/A;      . ROTATOR CUFF REPAIR Right   . TONSILLECTOMY    . TOTAL THYROIDECTOMY      Prior to Admission medications   Medication Sig Start Date End Date Taking? Authorizing Provider  acetaminophen (TYLENOL) 325 MG tablet Take 650 mg by mouth 3 (three) times daily.    Yes [provider]  albuterol (VENTOLIN HFA) 108 (90 Base) MCG/ACT inhaler Inhale 2 puffs into the lungs every 6 (six) hours as needed for wheezing or shortness of breath.   Yes [provider]  aspirin EC 81 MG tablet Take 81 mg by mouth daily.    Yes [provider]  budesonide-formoterol (SYMBICORT) 160-4.5 MCG/ACT inhaler Inhale 2 puffs into the lungs 2 (two) times daily.    Yes [provider]  diazepam (VALIUM) 5 MG tablet Take 5 mg by mouth every 6 (six) hours as needed for muscle spasms.    Yes [provider]  Eszopiclone (ESZOPICLONE) 3 MG TABS Take 3 mg by mouth at bedtime as needed (sleep).    Yes [provider]  FLUoxetine (PROZAC) 40 MG capsule Take 40 mg by mouth daily.   Yes [provider]  gabapentin (NEURONTIN) 100 MG capsule Take 200 mg by mouth 3 (three) times daily.   Yes [provider]  ipratropium-albuterol (DUONEB) 0.5-2.5 (3) MG/3ML  SOLN Take 3 mLs by nebulization every 6 (six) hours as needed (Shortness of breath, wheezing.). 02/10/18  Yes Sainani, Belia Heman, MD  levothyroxine (SYNTHROID, LEVOTHROID) 200 MCG tablet Take 200 mcg by mouth daily before breakfast.   Yes [provider]  losartan-hydrochlorothiazide (HYZAAR) 50-12.5 MG per tablet Take 1 tablet by mouth daily.    Yes [provider]  simvastatin (ZOCOR) 40 MG tablet Take 40 mg by mouth daily.   Yes [provider]  verapamil (CALAN-SR) 180 MG CR tablet Take 180 mg by mouth 2 (two) times daily.    Yes [provider]  furosemide (LASIX) 40 MG tablet Take 40 mg by mouth once a week.     [provider]    Allergies Patient has no known allergies.  Family History  Problem Relation Age of Onset  . Hypertension Mother   . Anxiety disorder Mother   . Stroke Mother        "  mini strokes"  . Coronary artery disease Mother   . Heart attack Mother   . Emphysema Father   . Heart disease Father   . Scoliosis Father   . Coronary artery disease Father   . GU problems Neg Hx   . Kidney disease Neg Hx   . Prostate cancer Neg Hx     Social History Social History   Tobacco Use  . Smoking status: Current Every Day Smoker    Packs/day: 1.00    Years: 35.00    Pack years: 35.00  . Smokeless tobacco: Current User  . Tobacco comment: Vapor  Substance Use Topics  . Alcohol use: Yes    Alcohol/week: 2.0 standard drinks    Types: 1 Glasses of wine, 1 Shots of liquor per week  . Drug use: No    Review of Systems Constitutional: No fever/chills Eyes: No visual changes. ENT: No sore throat. Cardiovascular: Denies chest pain. Respiratory: Denies shortness of breath. Gastrointestinal: No abdominal pain.  No nausea, no vomiting.  No diarrhea.  No constipation. Genitourinary: Negative for dysuria. Musculoskeletal: Negative for neck pain.  Negative for back pain.  Positive for bilateral lower extremity edema redness and  pain Integumentary: Negative for rash. Neurological: Negative for headaches, focal weakness or numbness.  ____________________________________________   PHYSICAL EXAM:  VITAL SIGNS: ED Triage Vitals  Enc Vitals Group     BP 11/26/18 2157 (!) 145/84     Pulse Rate 11/26/18 2157 69     Resp 11/26/18 2157 16     Temp 11/26/18 2157 97.9 F (36.6 C)     Temp Source 11/26/18 2157 Oral     SpO2 11/26/18 2157 97 %     Weight 11/26/18 2211 136.1 kg (300 lb)     Height 11/26/18 2211 1.88 m (6\' 2" )     Head Circumference --      Peak Flow --      Pain Score 11/26/18 2210 7     Pain Loc --      Pain Edu? --      Excl. in [place]? --     Constitutional: Alert and oriented. Well appearing and in no acute distress. Eyes: Conjunctivae are normal. PERRL. EOMI. Mouth/Throat: Mucous membranes are moist.  Oropharynx non-erythematous. Neck: No stridor.   Cardiovascular: Normal rate, regular rhythm. Good peripheral circulation. Grossly normal heart sounds. Respiratory: Normal respiratory effort.  No retractions. Lungs CTAB. Gastrointestinal: Soft and nontender. No distention.  Musculoskeletal: Bilateral lower extremity blanching erythema extending from dorsal aspect of the foot to proximal leg.  2+  edema bilaterally. Neurologic:  Normal speech and language. No gross focal neurologic deficits are appreciated.  Skin: Bilateral lower extremity erythema dorsum of the foot extending to the proximal Leg hot to touch consistent with cellulitis Psychiatric: Mood and affect are normal. Speech and behavior are normal.  ____________________________________________   LABS (all labs ordered are listed, but only abnormal results are displayed)  Labs Reviewed  URINALYSIS, COMPLETE (UACMP) WITH MICROSCOPIC - Abnormal; Notable for the following components:      Result Value   Color, Urine COLORLESS (*)    APPearance CLEAR (*)    Specific Gravity, Urine 1.003 (*)    All other components within normal limits    COMPREHENSIVE METABOLIC PANEL - Abnormal; Notable for the following components:   Glucose, Bld 143 (*)    AST 47 (*)    ALT 66 (*)    All other components within normal limits  CBC  ________________  RADIOLOGY I, Gregor Hams, personally viewed and evaluated these images (plain radiographs) as part of my medical decision making, as well as reviewing the written report by the radiologist.  ED MD interpretation: No active disease noted on chest x-ray cardiomegaly with chronic bibasilar opacities per radiologist  Official radiology report(s): Dg Chest Portable 1 View  Result Date: 11/27/2018 CLINICAL DATA:  Dyspnea EXAM: PORTABLE CHEST 1 VIEW COMPARISON:  02/07/2018, 12/11/2011 FINDINGS: Cardiomegaly without overt edema. Chronic hazy and streaky bibasilar opacity. No pleural effusion. No pneumothorax. IMPRESSION: No active disease. Cardiomegaly with chronic streaky and hazy basilar opacities. Electronically Signed   By: Donavan Foil M.D.   On: 11/27/2018 00:52   ED ECG REPORT I, [place] Kilmer, the attending physician, personally viewed and interpreted this ECG.   Date: 11/27/2018  EKG Time: 00:09 AM  Rate: 69  Rhythm: Normal sinus rhythm  Axis: Normal  Intervals: Normal  ST&T Change: None     Procedures   ____________________________________________   INITIAL IMPRESSION / ASSESSMENT AND PLAN / ED COURSE  As part of my medical decision making, I reviewed the following data within the electronic MEDICAL RECORD NUMBER  73 year old male presenting with above-stated history and physical exam secondary to bilateral lower extremity cellulitis with associated edema.  Patient given 40 mg IV Lasix with improvement of dyspnea.  In addition patient given IV clindamycin 600 mg.  Patient discussed with Dr. Jannifer Franklin for hospital admission further evaluation and management of cellulitis and peripheral edema.  ________________________________  FINAL CLINICAL IMPRESSION(S) / ED  DIAGNOSES  Final diagnoses:  Cellulitis of lower extremity, unspecified laterality  Peripheral edema     MEDICATIONS GIVEN DURING THIS VISIT:  Medications  clindamycin (CLEOCIN) IVPB 600 mg (has no administration in time range)  furosemide (LASIX) injection 40 mg (40 mg Intravenous Given 11/27/18 0037)     ED Discharge Orders    None       Note:  This document was prepared using Dragon voice recognition software and may include unintentional dictation errors.    Gregor Hams, MD 11/27/18 0122    Gregor Hams, MD 11/27/18 [phone]

## 2018-11-27 NOTE — Consult Note (Signed)
Cardiology Consultation Note    Patient ID: [name] [name], MRN: [id number], DOB/AGE: 1946-10-28 73 y.o. Admit date: 11/26/2018   Date of Consult: 11/27/2018 Primary Physician: Idelle Crouch, MD Primary Cardiologist: Dr. Saralyn Pilar  Chief Complaint: chf Reason for Consultation: Duanne Limerick Requesting MD: Dr. Anselm Jungling  HPI: [name] [name] is a 73 y.o. male with history of insignificant coronary artery disease by cardiac catheterization in 2011, chronic left bundle branch block, hypertension, hyperlipidemia, COPD and lymphedema who was admitted after presenting to the emergency room with peripheral edema.  He states he was taken off of his Lasix due to his renal function is noted his lower extremity edema has worsened.  Again he carries a diagnosis of lymphedema.  He was admitted with bilateral lower extremity cellulitis.Marland Kitchen  He was felt to have pulmonary edema.  He is fairly sedentary.  Echocardiogram done in 2018 revealed normal ejection fraction with an ejection fraction greater than 55% with moderate TR.  Chest x-ray revealed no evidence of pulmonary edema.  He had cardiomegaly.  He drinks 2 drinks a week.  He continues to smoke 1 to 2 packs of cigarettes a day.  Outpatient medications include albuterol, Synthroid, hydrochlorothiazide 12.5 mg daily, losartan 50 mg daily, verapamil 180 mg daily, simvastatin 40 mg daily and previously Lasix 40 mg daily however takes it approximately once a week.  Renal function is normal with creatinine of 1.1.  His creatinine last week was 1.0.  He did have a bump in his creatinine 1.6.  Past Medical History:  Diagnosis Date  . Anxiety   . Anxiety disorder 05/05/2014   unspecifed  . Cancer [facility])    prostate cancer   . Colonic polyp   . COPD (chronic obstructive pulmonary disease) ([place])   . Depression   . Dyspnea   . Elevated prostate specific antigen (PSA)   . Essential (primary) hypertension 05/05/2014  . 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 GI bleed    due  to diverticulitis   . Heart murmur    hx of 1960s  . History of thyroid cancer 05/05/2014  . Hypercholesteremia   . Hyperlipidemia   . Hypertension   . Hypothyroidism 05/05/2014  . Impotence of organic origin   . Male erectile dysfunction 12/29/2012  . Malignant neoplasm of prostate ([place])    10/30/2011 T1c - Identified by needle biopsy . Gleason 7 (3+4) 5 of 12 cores, Bilateral   . Prediabetes 06/22/2017  . Prostate cancer ([place])   . Shortness of breath on exertion 07/11/2015  . Sleep apnea    CPAP , report on chart , not used in last 2 weeks   . Thyroid cancer ([place])   . Thyroid disease   . Urinary obstruction    not elsewhere classified      Surgical History:  Past Surgical History:  Procedure Laterality Date  . BACK SURGERY     lumbar 1987   . BACK SURGERY     C-6- Plate&Pin  . CATARACT EXTRACTION W/PHACO Right 09/15/2018   Procedure: CATARACT EXTRACTION PHACO AND INTRAOCULAR LENS PLACEMENT ([place]) RIGHT;  Surgeon: Leandrew Koyanagi, MD;  Location: [place];  Service: Ophthalmology;  Laterality: Right;  Requests to be last case  . CATARACT EXTRACTION W/PHACO Left 10/06/2018   Procedure: CATARACT EXTRACTION PHACO AND INTRAOCULAR LENS PLACEMENT ([place])  LEFT;  Surgeon: Leandrew Koyanagi, MD;  Location: [place];  Service: Ophthalmology;  Laterality: Left;  . CERVICAL DISC SURGERY    . COLONOSCOPY  10/06/2011, 06/05/2006, 05/06/2002 adematous polyps: CBF 09/2014; Recall Ltr mailed 02/27/2015 (dw)  . COLONOSCOPY WITH PROPOFOL N/A 07/06/2015   Procedure: COLONOSCOPY WITH PROPOFOL;  Surgeon: Manya Silvas, MD;  Location: [facility] ENDOSCOPY;  Service: Endoscopy;  Laterality: N/A;  . COLONOSCOPY WITH PROPOFOL N/A 09/01/2018   Procedure: COLONOSCOPY WITH PROPOFOL;  Surgeon: Manya Silvas, MD;  Location: [facility] ENDOSCOPY;  Service: Endoscopy;  Laterality: N/A;  . HERNIA REPAIR     umbilical hernia repair   . LAMINECTOMY FOR EXCISION / EVACUATION INTRASPINAL  LESION     lumbar  . OTHER SURGICAL HISTORY     right rotator cuff surgery   . OTHER SURGICAL HISTORY     bilateral tubes in ears   . POSTERIOR LAMINECTOMY / DECOMPRESSION CERVICAL SPINE    . PROSTATE BIOPSY     10/22/2011 Volume:55.8 cc's, PSA:4.5, Free PSA:12%  . ROBOT ASSISTED LAPAROSCOPIC RADICAL PROSTATECTOMY  12/15/2011   Procedure: ROBOTIC ASSISTED LAPAROSCOPIC RADICAL PROSTATECTOMY LEVEL 2;  Surgeon: Dutch Gray, MD;  [facility];  Service: Urology;  Laterality: N/A;      . ROTATOR CUFF REPAIR Right   . TONSILLECTOMY    . TOTAL THYROIDECTOMY       Home Meds: Prior to Admission medications   Medication Sig Start Date End Date Taking? Authorizing Provider  acetaminophen (TYLENOL) 325 MG tablet Take 650 mg by mouth 3 (three) times daily.    Yes [provider]  albuterol (VENTOLIN HFA) 108 (90 Base) MCG/ACT inhaler Inhale 2 puffs into the lungs every 6 (six) hours as needed for wheezing or shortness of breath.   Yes [provider]  aspirin EC 81 MG tablet Take 81 mg by mouth daily.    Yes [provider]  budesonide-formoterol (SYMBICORT) 160-4.5 MCG/ACT inhaler Inhale 2 puffs into the lungs 2 (two) times daily.    Yes [provider]  diazepam (VALIUM) 5 MG tablet Take 5 mg by mouth every 6 (six) hours as needed for muscle spasms.    Yes [provider]  Eszopiclone (ESZOPICLONE) 3 MG TABS Take 3 mg by mouth at bedtime as needed (sleep).    Yes [provider]  FLUoxetine (PROZAC) 40 MG capsule Take 40 mg by mouth daily.   Yes [provider]  gabapentin (NEURONTIN) 100 MG capsule Take 200 mg by mouth 3 (three) times daily.   Yes [provider]  ipratropium-albuterol (DUONEB) 0.5-2.5 (3) MG/3ML SOLN Take 3 mLs by nebulization every 6 (six) hours as needed (Shortness of breath, wheezing.). 02/10/18  Yes Sainani, Belia Heman, MD  levothyroxine (SYNTHROID, LEVOTHROID) 200 MCG tablet Take 200 mcg by mouth daily  before breakfast.   Yes [provider]  losartan-hydrochlorothiazide (HYZAAR) 50-12.5 MG per tablet Take 1 tablet by mouth daily.    Yes [provider]  simvastatin (ZOCOR) 40 MG tablet Take 40 mg by mouth daily.   Yes [provider]  verapamil (CALAN-SR) 180 MG CR tablet Take 180 mg by mouth 2 (two) times daily.    Yes [provider]  furosemide (LASIX) 40 MG tablet Take 40 mg by mouth once a week.     [provider]    Inpatient Medications:  . aspirin EC  81 mg Oral Daily  . enoxaparin (LOVENOX) injection  40 mg Subcutaneous Q24H  . FLUoxetine  40 mg Oral Daily  . gabapentin  200 mg Oral TID  . losartan  50 mg Oral Daily   And  . hydrochlorothiazide  12.5 mg Oral Daily  . levothyroxine  200 mcg Oral QAC breakfast  . mometasone-formoterol  2 puff Inhalation BID  . simvastatin  40 mg Oral Daily  . sodium chloride flush  3 mL Intravenous Q12H  . verapamil  180 mg Oral BID   . sodium chloride 500 mL (11/27/18 0812)  . clindamycin (CLEOCIN) IV 600 mg (11/27/18 5852)    Allergies: No Known Allergies  Social History   Socioeconomic History  . Marital status: Unknown    Spouse name: Not on file  . Number of children: 0  . Years of education: 80  . Highest education level: Associate degree: occupational, Hotel manager, or vocational program  Occupational History  . Not on file  Social Needs  . Financial resource strain: Not on file  . Food insecurity:    Worry: Not on file    Inability: Not on file  . Transportation needs:    Medical: Not on file    Non-medical: Not on file  Tobacco Use  . Smoking status: Current Every Day Smoker    Packs/day: 1.00    Years: 35.00    Pack years: 35.00  . Smokeless tobacco: Current User  . Tobacco comment: Vapor  Substance and Sexual Activity  . Alcohol use: Yes    Alcohol/week: 2.0 standard drinks    Types: 1 Glasses of wine, 1 Shots of liquor per week  . Drug use: No  . Sexual activity:  Not on file  Lifestyle  . Physical activity:    Days per week: Not on file    Minutes per session: Not on file  . Stress: Not on file  Relationships  . Social connections:    Talks on phone: Not on file    Gets together: Not on file    Attends religious service: Not on file    Active member of club or organization: Not on file    Attends meetings of clubs or organizations: Not on file    Relationship status: Not on file  . Intimate partner violence:    Fear of current or ex partner: Not on file    Emotionally abused: Not on file    Physically abused: Not on file    Forced sexual activity: Not on file  Other Topics Concern  . Not on file  Social History Narrative  . Not on file     Family History  Problem Relation Age of Onset  . Hypertension Mother   . Anxiety disorder Mother   . Stroke Mother        "mini strokes"  . Coronary artery disease Mother   . Heart attack Mother   . Emphysema Father   . Heart disease Father   . Scoliosis Father   . Coronary artery disease Father   . GU problems Neg Hx   . Kidney disease Neg Hx   . Prostate cancer Neg Hx      Review of Systems: A 12-system review of systems was performed and is negative except as noted in the HPI.  Labs: No results for input(s): CKTOTAL, CKMB, TROPONINI in the last 72 hours. Lab Results  Component Value Date   WBC 8.5 11/27/2018   HGB 14.3 11/27/2018   HCT 43.3 11/27/2018   MCV 95.0 11/27/2018   PLT 221 11/27/2018    Recent Labs  Lab 11/26/18 2213 11/27/18 0602  NA 139 139  K 4.1 3.6  CL 100 99  CO2 28 29  BUN 17 18  CREATININE 1.01 1.10  CALCIUM 9.5 9.3  PROT 7.1  --   BILITOT 1.0  --   ALKPHOS 92  --   ALT 66*  --   AST 47*  --   GLUCOSE 143* 131*   No results found for: CHOL, HDL, LDLCALC, TRIG No results found for: DDIMER  Radiology/Studies:  Dg Chest Portable 1 View  Result Date: 11/27/2018 CLINICAL DATA:  Dyspnea EXAM: PORTABLE CHEST 1 VIEW COMPARISON:  02/07/2018,  12/11/2011 FINDINGS: Cardiomegaly without overt edema. Chronic hazy and streaky bibasilar opacity. No pleural effusion. No pneumothorax. IMPRESSION: No active disease. Cardiomegaly with chronic streaky and hazy basilar opacities. Electronically Signed   By: Donavan Foil M.D.   On: 11/27/2018 00:52    Wt Readings from Last 3 Encounters:  11/27/18 (!) 140.2 kg  10/06/18 133.4 kg  09/15/18 132 kg    EKG: EKG revealed sinus rhythm with right bundle branch block and left anterior fascicular block.  No change from baseline.  Physical Exam:  Blood pressure (!) 154/80, pulse 64, temperature 98 F (36.7 C), temperature source Oral, resp. rate 19, height 6\' 2"  (1.88 m), weight (!) 140.2 kg, SpO2 96 %. Body mass index is 39.69 kg/m. General: Well developed, well nourished, in no acute distress. Head: Normocephalic, atraumatic, sclera non-icteric, no xanthomas, nares are without discharge.  Neck: Negative for carotid bruits. JVD not elevated. Lungs: No wheezes or rales. Heart: RRR with S1 S2. No murmurs, rubs, or gallops appreciated. Abdomen: Soft, non-tender, non-distended with normoactive bowel sounds. No hepatomegaly. No rebound/guarding. No obvious abdominal masses. Msk:  Strength and tone appear normal for age. Extremities: No clubbing or cyanosis.  4+ edema bilaterally with erythema bilaterally.  Distal pedal pulses are 2+ and equal bilaterally. Neuro: Alert and oriented X 3. No facial asymmetry. No focal deficit. Moves all extremities spontaneously. Psych:  Responds to questions appropriately with a normal affect.     Assessment and Plan  Patient is a 73 year old male with preserved LV function with history of peripheral edema and probable lymphedema who was admitted with worsening lower extremity edema and erythema consistent with cellulitis.  Patient has been taken off of Lasix as an outpatient due to bump in his serum creatinine.  He has noted worsening peripheral edema since that  occurred.  He denies shortness of breath.  He denies chest pain.  He attempts to eat a low-sodium diet.  Echocardiogram revealed preserved LV function.  Would continue with empiric antibiotics.  Gentle diuresis following renal function.  Consider compression garments if not resolved with IV antibiotics.  Does not appear to have left heart failure at present clinically or by chest x-ray.  Signed, Teodoro Spray MD 11/27/2018, 10:02 AM Pager: [phone]

## 2018-11-27 NOTE — Progress Notes (Signed)
[place] [facility] [employer] NAME: [name] [name]    MR#:  [id number]  DATE OF BIRTH:  [birth year]  SUBJECTIVE:  CHIEF COMPLAINT:   Chief Complaint  Patient presents with  . Leg Swelling    Billaterally  . Leg Pain    Bilalterally   He was recently taken off from Lasix because of worsening renal function, since then he has worsening bilateral leg edema and now with redness.  Feels slightly better today.  REVIEW OF SYSTEMS:  CONSTITUTIONAL: No fever, fatigue or weakness.  EYES: No blurred or double vision.  EARS, NOSE, AND THROAT: No tinnitus or ear pain.  RESPIRATORY: No cough, shortness of breath, wheezing or hemoptysis.  CARDIOVASCULAR: No chest pain, orthopnea, have bilateral edema.  GASTROINTESTINAL: No nausea, vomiting, diarrhea or abdominal pain.  GENITOURINARY: No dysuria, hematuria.  ENDOCRINE: No polyuria, nocturia,  HEMATOLOGY: No anemia, easy bruising or bleeding SKIN: No rash or lesion. MUSCULOSKELETAL: No joint pain or arthritis.   NEUROLOGIC: No tingling, numbness, weakness.  PSYCHIATRY: No anxiety or depression.   ROS  DRUG ALLERGIES:  No Known Allergies  VITALS:  Blood pressure (!) 151/78, pulse 62, temperature 98.3 F (36.8 C), temperature source Oral, resp. rate 19, height 6\' 2"  (1.88 m), weight (!) 140.2 kg, SpO2 98 %.  PHYSICAL EXAMINATION:  GENERAL:  73 y.o.-year-old patient lying in the bed with no acute distress.  EYES: Pupils equal, round, reactive to light and accommodation. No scleral icterus. Extraocular muscles intact.  HEENT: Head atraumatic, normocephalic. Oropharynx and nasopharynx clear.  NECK:  Supple, no jugular venous distention. No thyroid enlargement, no tenderness.  LUNGS: Normal breath sounds bilaterally, no wheezing, rales,rhonchi or crepitation. No use of accessory muscles of respiration.  CARDIOVASCULAR: S1, S2 normal. No murmurs, rubs, or gallops.  ABDOMEN: Soft, nontender, nondistended. Bowel  sounds present. No organomegaly or mass.  EXTREMITIES: Bilateral pedal edema, no cyanosis, or clubbing.  Redness on both lower legs which is marked by skin markers and today it looks slightly better than the marking done yesterday in emergency room. NEUROLOGIC: Cranial nerves II through XII are intact. Muscle strength 5/5 in all extremities. Sensation intact. Gait not checked.  PSYCHIATRIC: The patient is alert and oriented x 3.  SKIN: No obvious rash, lesion, or ulcer.   Physical Exam LABORATORY PANEL:   CBC Recent Labs  Lab 11/27/18 0602  WBC 8.5  HGB 14.3  HCT 43.3  PLT 221   ------------------------------------------------------------------------------------------------------------------  Chemistries  Recent Labs  Lab 11/26/18 2213 11/27/18 0602  NA 139 139  K 4.1 3.6  CL 100 99  CO2 28 29  GLUCOSE 143* 131*  BUN 17 18  CREATININE 1.01 1.10  CALCIUM 9.5 9.3  AST 47*  --   ALT 66*  --   ALKPHOS 92  --   BILITOT 1.0  --    ------------------------------------------------------------------------------------------------------------------  Cardiac Enzymes No results for input(s): TROPONINI in the last 168 hours. ------------------------------------------------------------------------------------------------------------------  RADIOLOGY:  Dg Chest Portable 1 View  Result Date: 11/27/2018 CLINICAL DATA:  Dyspnea EXAM: PORTABLE CHEST 1 VIEW COMPARISON:  02/07/2018, 12/11/2011 FINDINGS: Cardiomegaly without overt edema. Chronic hazy and streaky bibasilar opacity. No pleural effusion. No pneumothorax. IMPRESSION: No active disease. Cardiomegaly with chronic streaky and hazy basilar opacities. Electronically Signed   By: Donavan Foil M.D.   On: 11/27/2018 00:52    ASSESSMENT AND PLAN:   Principal Problem:   Cellulitis Active Problems:   COPD (chronic obstructive pulmonary disease) ([facility])  Major depressive disorder   Acute on chronic diastolic CHF (congestive heart  failure) ([facility])   Hypothyroidism   OSA (obstructive sleep apnea)   HTN (hypertension)   HLD (hyperlipidemia)  * Cellulitis -clindamycin.  *Bilateral leg edema Suggested elastic wrap and small dose Lasix.   * Acute on chronic diastolic CHF -suspected on admission but ruled out by cardiologist. appreciated echocardiogram and cardiology consult Resume home dose of oral Lasix.  Monitor intake and output.  *  COPD (chronic obstructive pulmonary disease) ([facility]) -continue home meds *  HTN (hypertension) -home dose antihypertensives  * Major depressive disorder -home dose antidepressant  * Hypothyroidism -home dose thyroid replacement *  OSA (obstructive sleep apnea) -CPAP nightly  * HLD (hyperlipidemia) -Home dose antilipid    All the records are reviewed and case discussed with Care Management/Social Workerr. Management plans discussed with the patient, family and they are in agreement.  CODE STATUS: Full code.  TOTAL TIME TAKING CARE OF THIS PATIENT: 35 minutes.    POSSIBLE D/C IN 1-2 DAYS, DEPENDING ON CLINICAL CONDITION.   Vaughan Basta M.D on 11/27/2018   Between 7am to 6pm - Pager - [phone]  After 6pm go to [email or web address] - password [email or web address]  Office  [phone]  CC: Primary care physician; Idelle Crouch, MD  Note: This dictation was prepared with Dragon dictation along with smaller phrase technology. Any transcriptional errors that result from this process are unintentional.

## 2018-11-28 MED ORDER — IPRATROPIUM-ALBUTEROL 0.5-2.5 (3) MG/3ML IN SOLN
3.0000 mL | Freq: Four times a day (QID) | RESPIRATORY_TRACT | Status: DC | PRN
  Administered 2018-11-28 – 2018-11-29 (×3): 3 mL via RESPIRATORY_TRACT
  Filled 2018-11-28 (×3): qty 3

## 2018-11-28 NOTE — Progress Notes (Signed)
[place] at [place] NAME: [name] [name]    MR#:  [id number]  DATE OF BIRTH:  November 11, 1946  SUBJECTIVE:  CHIEF COMPLAINT:   Chief Complaint  Patient presents with  . Leg Swelling    Billaterally  . Leg Pain    Bilalterally   He was recently taken off from Lasix because of worsening renal function, since then he has worsening bilateral leg edema and now with redness.  Feels slightly better today. Elastic wrap applied on both legs.  REVIEW OF SYSTEMS:  CONSTITUTIONAL: No fever, fatigue or weakness.  EYES: No blurred or double vision.  EARS, NOSE, AND THROAT: No tinnitus or ear pain.  RESPIRATORY: No cough, shortness of breath, wheezing or hemoptysis.  CARDIOVASCULAR: No chest pain, orthopnea, have bilateral edema.  GASTROINTESTINAL: No nausea, vomiting, diarrhea or abdominal pain.  GENITOURINARY: No dysuria, hematuria.  ENDOCRINE: No polyuria, nocturia,  HEMATOLOGY: No anemia, easy bruising or bleeding SKIN: No rash or lesion. MUSCULOSKELETAL: No joint pain or arthritis.   NEUROLOGIC: No tingling, numbness, weakness.  PSYCHIATRY: No anxiety or depression.   ROS  DRUG ALLERGIES:  No Known Allergies  VITALS:  Blood pressure 135/68, pulse 63, temperature 98.8 F (37.1 C), temperature source Oral, resp. rate 19, height 6\' 2"  (1.88 m), weight (!) 139 kg, SpO2 97 %.  PHYSICAL EXAMINATION:  GENERAL:  73 y.o.-year-old patient lying in the bed with no acute distress.  EYES: Pupils equal, round, reactive to light and accommodation. No scleral icterus. Extraocular muscles intact.  HEENT: Head atraumatic, normocephalic. Oropharynx and nasopharynx clear.  NECK:  Supple, no jugular venous distention. No thyroid enlargement, no tenderness.  LUNGS: Normal breath sounds bilaterally, no wheezing, rales,rhonchi or crepitation. No use of accessory muscles of respiration.  CARDIOVASCULAR: S1, S2 normal. No murmurs, rubs, or gallops.  ABDOMEN: Soft,  nontender, nondistended. Bowel sounds present. No organomegaly or mass.  EXTREMITIES: Bilateral pedal edema, no cyanosis, or clubbing.  Redness on both lower legs which is marked by skin markers and today it looks slightly better than the marking done yesterday in emergency room. NEUROLOGIC: Cranial nerves II through XII are intact. Muscle strength 5/5 in all extremities. Sensation intact. Gait not checked.  PSYCHIATRIC: The patient is alert and oriented x 3.  SKIN: No obvious rash, lesion, or ulcer.   Physical Exam LABORATORY PANEL:   CBC Recent Labs  Lab 11/27/18 0602  WBC 8.5  HGB 14.3  HCT 43.3  PLT 221   ------------------------------------------------------------------------------------------------------------------  Chemistries  Recent Labs  Lab 11/26/18 2213 11/27/18 0602  NA 139 139  K 4.1 3.6  CL 100 99  CO2 28 29  GLUCOSE 143* 131*  BUN 17 18  CREATININE 1.01 1.10  CALCIUM 9.5 9.3  AST 47*  --   ALT 66*  --   ALKPHOS 92  --   BILITOT 1.0  --    ------------------------------------------------------------------------------------------------------------------  Cardiac Enzymes No results for input(s): TROPONINI in the last 168 hours. ------------------------------------------------------------------------------------------------------------------  RADIOLOGY:  Dg Chest Portable 1 View  Result Date: 11/27/2018 CLINICAL DATA:  Dyspnea EXAM: PORTABLE CHEST 1 VIEW COMPARISON:  02/07/2018, 12/11/2011 FINDINGS: Cardiomegaly without overt edema. Chronic hazy and streaky bibasilar opacity. No pleural effusion. No pneumothorax. IMPRESSION: No active disease. Cardiomegaly with chronic streaky and hazy basilar opacities. Electronically Signed   By: Donavan Foil M.D.   On: 11/27/2018 00:52    ASSESSMENT AND PLAN:   Principal Problem:   Cellulitis Active Problems:   COPD (chronic obstructive  pulmonary disease) ([place])   Major depressive disorder   Acute on chronic  diastolic CHF (congestive heart failure) ([facility])   Hypothyroidism   OSA (obstructive sleep apnea)   HTN (hypertension)   HLD (hyperlipidemia)  * Cellulitis -clindamycin.  *Bilateral leg edema Suggested elastic wrap and small dose Lasix.   * Acute on chronic diastolic CHF -suspected on admission but ruled out by cardiologist. appreciated echocardiogram and cardiology consult Resume home dose of oral Lasix.  Monitor intake and output. Elastic wrap on legs is helping good for diuresis.  *  COPD (chronic obstructive pulmonary disease) ([facility]) -continue home meds *  HTN (hypertension) -home dose antihypertensives  * Major depressive disorder -home dose antidepressant  * Hypothyroidism -home dose thyroid replacement *  OSA (obstructive sleep apnea) -CPAP nightly  * HLD (hyperlipidemia) -Home dose antilipid    All the records are reviewed and case discussed with Care Management/Social Workerr. Management plans discussed with the patient, family and they are in agreement.  CODE STATUS: Full code.  TOTAL TIME TAKING CARE OF THIS PATIENT: 35 minutes.    POSSIBLE D/C IN 1-2 DAYS, DEPENDING ON CLINICAL CONDITION.   Vaughan Basta M.D on 11/28/2018   Between 7am to 6pm - Pager - [phone]  After 6pm go to [email or web address] - password [email or web address]  Office  [phone]  CC: Primary care physician; Idelle Crouch, MD  Note: This dictation was prepared with Dragon dictation along with smaller phrase technology. Any transcriptional errors that result from this process are unintentional.

## 2018-11-28 NOTE — Progress Notes (Signed)
Patient Name: [name] [name] Date of Encounter: 11/28/2018  Hospital Problem List     Principal Problem:   Cellulitis Active Problems:   COPD (chronic obstructive pulmonary disease) ([facility])   Major depressive disorder   Acute on chronic diastolic CHF (congestive heart failure) ([facility])   Hypothyroidism   OSA (obstructive sleep apnea)   HTN (hypertension)   HLD (hyperlipidemia)    Patient Profile     Patient with preserved LV function and peripheral edema.  Now has cellulitis with worsening peripheral edema.  Improved with diuresis and pneumatic compression.  We will continue with this  Subjective   Feels better  Inpatient Medications    . aspirin EC  81 mg Oral Daily  . enoxaparin (LOVENOX) injection  40 mg Subcutaneous Q24H  . FLUoxetine  40 mg Oral Daily  . furosemide  20 mg Oral Daily  . gabapentin  200 mg Oral TID  . losartan  50 mg Oral Daily   And  . hydrochlorothiazide  12.5 mg Oral Daily  . levothyroxine  200 mcg Oral QAC breakfast  . mometasone-formoterol  2 puff Inhalation BID  . simvastatin  40 mg Oral Daily  . sodium chloride flush  3 mL Intravenous Q12H  . verapamil  180 mg Oral BID    Vital Signs    Vitals:   11/27/18 2015 11/28/18 0426 11/28/18 0724 11/28/18 1554  BP: (!) 158/75 137/88 140/78 139/69  Pulse: 60 (!) 55 (!) 53 62  Resp: 18 18 19 19   Temp: 99.1 F (37.3 C) 98.1 F (36.7 C) 98.2 F (36.8 C) 98.6 F (37 C)  TempSrc: Oral Oral Oral Oral  SpO2: 95% 96% 94% 95%  Weight:  (!) 139 kg    Height:        Intake/Output Summary (Last 24 hours) at 11/28/2018 1614 Last data filed at 11/28/2018 1555 Gross per 24 hour  Intake 121.16 ml  Output 1650 ml  Net -1528.84 ml   Filed Weights   11/26/18 2211 11/27/18 0220 11/28/18 0426  Weight: 136.1 kg (!) 140.2 kg (!) 139 kg    Physical Exam    GEN: Well nourished, well developed, in no acute distress.  HEENT: normal.  Neck: Supple, no JVD, carotid bruits, or masses. Cardiac: RRR, no  murmurs, rubs, or gallops. No clubbing, cyanosis, edema.  Radials/DP/PT 2+ and equal bilaterally.  Respiratory:  Respirations regular and unlabored, clear to auscultation bilaterally. GI: Soft, nontender, nondistended, BS + x 4. MS: no deformity or atrophy. Skin: warm and dry, no rash. Neuro:  Strength and sensation are intact. Psych: Normal affect.  Labs    CBC Recent Labs    11/26/18 2213 11/27/18 0602  WBC 10.0 8.5  HGB 14.8 14.3  HCT 44.7 43.3  MCV 94.3 95.0  PLT 227 144   Basic Metabolic Panel Recent Labs    11/26/18 2213 11/27/18 0602  NA 139 139  K 4.1 3.6  CL 100 99  CO2 28 29  GLUCOSE 143* 131*  BUN 17 18  CREATININE 1.01 1.10  CALCIUM 9.5 9.3   Liver Function Tests Recent Labs    11/26/18 2213  AST 47*  ALT 66*  ALKPHOS 92  BILITOT 1.0  PROT 7.1  ALBUMIN 3.9   No results for input(s): LIPASE, AMYLASE in the last 72 hours. Cardiac Enzymes No results for input(s): CKTOTAL, CKMB, CKMBINDEX, TROPONINI in the last 72 hours. BNP No results for input(s): BNP in the last 72 hours. D-Dimer No results for  input(s): DDIMER in the last 72 hours. Hemoglobin A1C No results for input(s): HGBA1C in the last 72 hours. Fasting Lipid Panel No results for input(s): CHOL, HDL, LDLCALC, TRIG, CHOLHDL, LDLDIRECT in the last 72 hours. Thyroid Function Tests No results for input(s): TSH, T4TOTAL, T3FREE, THYROIDAB in the last 72 hours.  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  Radiology    Dg Chest Portable 1 View  Result Date: 11/27/2018 CLINICAL DATA:  Dyspnea EXAM: PORTABLE CHEST 1 VIEW COMPARISON:  02/07/2018, 12/11/2011 FINDINGS: Cardiomegaly without overt edema. Chronic hazy and streaky bibasilar opacity. No pleural effusion. No pneumothorax. IMPRESSION: No active disease. Cardiomegaly  with chronic streaky and hazy basilar opacities. Electronically Signed   By: Donavan Foil M.D.   On: 11/27/2018 00:52    Assessment & Plan    Peripheral edema-improved with diuresis.  Renal function is stable.  Has Unna boots in place and pneumatic compression device in place.  And does have improvement in symptoms with this.  Preserved LV function no clinical evidence of congestive heart failure.  Signed, Javier Docker Kadia Abaya MD 11/28/2018, 4:14 PM  Pager: [phone]

## 2018-11-28 NOTE — Progress Notes (Signed)
Family Meeting Note  Advance Directive:yes  Today a meeting took place with the Patient.   The following clinical team members were present during this meeting:MD  The following were discussed:Patient's diagnosis: CHF, SOB, edema, Patient's progosis: Unable to determine and Goals for treatment: DNR  Pt is informed about Heart fialure etc. He clarifies, He will not like CPR, Intubation or vent support. Additional follow-up to be provided: *cardiology  Time spent during discussion:20 minutes  Vaughan Basta, MD

## 2018-11-29 LAB — BASIC METABOLIC PANEL
ANION GAP: 6 (ref 5–15)
BUN: 20 mg/dL (ref 8–23)
CO2: 32 mmol/L (ref 22–32)
Calcium: 8.7 mg/dL — ABNORMAL LOW (ref 8.9–10.3)
Chloride: 100 mmol/L (ref 98–111)
Creatinine, Ser: 1.02 mg/dL (ref 0.61–1.24)
GFR calc non Af Amer: >60 mL/min (ref >60)
Glucose, Bld: 140 mg/dL — ABNORMAL HIGH (ref 70–99)
Potassium: 3.6 mmol/L (ref 3.5–5.1)
Sodium: 138 mmol/L (ref 135–145)

## 2018-11-29 MED ORDER — DIPHENHYDRAMINE HCL 25 MG PO CAPS
25.0000 mg | ORAL_CAPSULE | Freq: Every evening | ORAL | Status: DC | PRN
  Administered 2018-11-29: 25 mg via ORAL
  Filled 2018-11-29: qty 1

## 2018-11-29 MED ORDER — FUROSEMIDE 20 MG PO TABS: 20.0000 mg | ORAL_TABLET | Freq: Every day | ORAL | 0 refills | Status: DC

## 2018-11-29 MED ORDER — CLINDAMYCIN HCL 300 MG PO CAPS: 600.0000 mg | ORAL_CAPSULE | Freq: Three times a day (TID) | ORAL | 0 refills | Status: AC

## 2018-11-29 MED ORDER — OXYCODONE-ACETAMINOPHEN 5-325 MG PO TABS: 1.0000 | ORAL_TABLET | Freq: Three times a day (TID) | ORAL | 0 refills | Status: DC | PRN

## 2018-11-29 NOTE — Discharge Summary (Addendum)
[place] [facility] [place] NAME: [name] [name]    MR#:  [id number]  DATE OF BIRTH:  1946-06-30  DATE OF ADMISSION:  11/26/2018   ADMITTING PHYSICIAN: Lance Coon, MD  DATE OF DISCHARGE: 11/29/2018  PRIMARY CARE PHYSICIAN: Idelle Crouch, MD   ADMISSION DIAGNOSIS:  Peripheral edema [R60.9] Cellulitis of lower extremity, unspecified laterality [S96.283] DISCHARGE DIAGNOSIS:  Principal Problem:   Cellulitis Active Problems:   COPD (chronic obstructive pulmonary disease) (HCC)   Major depressive disorder   Acute on chronic diastolic CHF (congestive heart failure) (HCC)   Hypothyroidism   OSA (obstructive sleep apnea)   HTN (hypertension)   HLD (hyperlipidemia)  SECONDARY DIAGNOSIS:   Past Medical History:  Diagnosis Date  . Anxiety   . Anxiety disorder 05/05/2014   unspecifed  . Cancer [facility])    prostate cancer   . Colonic polyp   . COPD (chronic obstructive pulmonary disease) ([place])   . Depression   . Dyspnea   . Elevated prostate specific antigen (PSA)   . Essential (primary) hypertension 05/05/2014  . 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 GI bleed    due to diverticulitis   . Heart murmur    hx of 1960s  . History of thyroid cancer 05/05/2014  . Hypercholesteremia   . Hyperlipidemia   . Hypertension   . Hypothyroidism 05/05/2014  . Impotence of organic origin   . Male erectile dysfunction 12/29/2012  . Malignant neoplasm of prostate ([place])    10/30/2011 T1c - Identified by needle biopsy . Gleason 7 (3+4) 5 of 12 cores, Bilateral   . Prediabetes 06/22/2017  . Prostate cancer ([place])   . Shortness of breath on exertion 07/11/2015  . Sleep apnea    CPAP , report on chart , not used in last 2 weeks   . Thyroid cancer ([place])   . Thyroid disease   . Urinary obstruction    not elsewhere classified   HOSPITAL COURSE:   Chief complaint; bilateral lower extremity swelling and pain with redness  HPI [name] [name]  is a 73  y.o. male who presented with chief complaint as above.  Patient states that he was recently taken off of his Lasix dose due to concern for worsening renal function.  He states that over the last several weeks his lower extremity edema has gotten worse.  The past couple of days he developed erythema and tenderness in his legs.  In the ED he appeared to have bilateral lower extremity cellulitis.  Hospitalist called for admission and further evaluation.  Please refer to the H&P dictated for further details.  HOSPITAL COURSE 1.  Bilateral lower extremity cellulitis  This is likely triggered by patient being taken off Lasix previously.  Presented with bilateral lower extremity swelling, redness and tenderness.  Was treated with IV clindamycin with significant improvement.  Started back on Lasix and diuresed well with significant improvement as well.  Clinically and hemodynamically stable.  Being discharged on p.o. clindamycin for 5 more days to complete treatment duration.  Prescription for as needed Percocet for a few days given.  Follow-up with primary care physician. Patient being discharged home with home health with physical therapy to evaluate and treat   2.Bilateral leg edema Improved with elastic wrap as well as management with Lasix during this admission.  Clinically and hemodynamically stable.  Keep leg elevated while laying in bed.  3.Acute on chronic diastolic CHF -suspected on admission but ruled out by cardiologist.  2D echocardiogram done with ejection fraction of 55 to 60%.  Patient clinically and hemodynamically stable.  Resumed Lasix which was continued on discharge.    4.  History ofCOPD (chronic obstructive pulmonary disease) (HCC) -stable.  Continue home meds  5. HTN (hypertension) -continue home dose antihypertensives  6.Major depressive disorder -home dose antidepressant  7. Hypothyroidism -home dose thyroid replacement  8. OSA (obstructive sleep apnea) -CPAP  nightly  9. HLD (hyperlipidemia) -Home dose antilipid  DISCHARGE CONDITIONS:  Stable CONSULTS OBTAINED:  Treatment Team:  Teodoro Spray, MD DRUG ALLERGIES:  No Known Allergies DISCHARGE MEDICATIONS:   Allergies as of 11/29/2018   No Known Allergies     Medication List    TAKE these medications   acetaminophen 325 MG tablet Commonly known as:  TYLENOL Take 650 mg by mouth 3 (three) times daily.   aspirin EC 81 MG tablet Take 81 mg by mouth daily.   budesonide-formoterol 160-4.5 MCG/ACT inhaler Commonly known as:  SYMBICORT Inhale 2 puffs into the lungs 2 (two) times daily.   clindamycin 300 MG capsule Commonly known as:  CLEOCIN Take 2 capsules (600 mg total) by mouth 3 (three) times daily for 5 days.   diazepam 5 MG tablet Commonly known as:  VALIUM Take 5 mg by mouth every 6 (six) hours as needed for muscle spasms.   eszopiclone 3 MG Tabs Generic drug:  Eszopiclone Take 3 mg by mouth at bedtime as needed (sleep).   FLUoxetine 40 MG capsule Commonly known as:  PROZAC Take 40 mg by mouth daily.   furosemide 20 MG tablet Commonly known as:  LASIX Take 1 tablet (20 mg total) by mouth daily. Start taking on:  November 30, 2018 What changed:    medication strength  how much to take  when to take this   gabapentin 100 MG capsule Commonly known as:  NEURONTIN Take 200 mg by mouth 3 (three) times daily.   ipratropium-albuterol 0.5-2.5 (3) MG/3ML Soln Commonly known as:  DUONEB Take 3 mLs by nebulization every 6 (six) hours as needed (Shortness of breath, wheezing.).   levothyroxine 200 MCG tablet Commonly known as:  SYNTHROID, LEVOTHROID Take 200 mcg by mouth daily before breakfast.   losartan-hydrochlorothiazide 50-12.5 MG tablet Commonly known as:  HYZAAR Take 1 tablet by mouth daily.   oxyCODONE-acetaminophen 5-325 MG tablet Commonly known as:  PERCOCET/ROXICET Take 1 tablet by mouth every 8 (eight) hours as needed for moderate pain or severe  pain.   simvastatin 40 MG tablet Commonly known as:  ZOCOR Take 40 mg by mouth daily.   VENTOLIN HFA 108 (90 Base) MCG/ACT inhaler Generic drug:  albuterol Inhale 2 puffs into the lungs every 6 (six) hours as needed for wheezing or shortness of breath.   verapamil 180 MG CR tablet Commonly known as:  CALAN-SR Take 180 mg by mouth 2 (two) times daily.        DISCHARGE INSTRUCTIONS:   DIET:  Cardiac diet DISCHARGE CONDITION:  Stable ACTIVITY:  Activity as tolerated OXYGEN:  Home Oxygen: No.  Oxygen Delivery: room air DISCHARGE LOCATION:  home   If you experience worsening of your admission symptoms, develop shortness of breath, life threatening emergency, suicidal or homicidal thoughts you must seek medical attention immediately by calling 911 or calling your MD immediately  if symptoms less severe.  You Must read complete instructions/literature along with all the possible adverse reactions/side effects for all the Medicines you take and that have been prescribed to you. Take any  new Medicines after you have completely understood and accpet all the possible adverse reactions/side effects.   Please note  You were cared for by a hospitalist during your hospital stay. If you have any questions about your discharge medications or the care you received while you were in the hospital after you are discharged, you can call the unit and asked to speak with the hospitalist on call if the hospitalist that took care of you is not available. Once you are discharged, your primary care physician will handle any further medical issues. Please note that NO REFILLS for any discharge medications will be authorized once you are discharged, as it is imperative that you return to your primary care physician (or establish a relationship with a primary care physician if you do not have one) for your aftercare needs so that they can reassess your need for medications and monitor your lab  values.    On the day of Discharge:  VITAL SIGNS:  Blood pressure (!) 154/74, pulse (!) 51, temperature (!) 97.3 F (36.3 C), temperature source Oral, resp. rate 14, height 6\' 2"  (1.88 m), weight (!) 143.1 kg, SpO2 94 %. PHYSICAL EXAMINATION:  GENERAL:  73 y.o.-year-old patient lying in the bed with no acute distress.  EYES: Pupils equal, round, reactive to light and accommodation. No scleral icterus. Extraocular muscles intact.  HEENT: Head atraumatic, normocephalic. Oropharynx and nasopharynx clear.  NECK:  Supple, no jugular venous distention. No thyroid enlargement, no tenderness.  LUNGS: Normal breath sounds bilaterally, no wheezing, rales,rhonchi or crepitation. No use of accessory muscles of respiration.  CARDIOVASCULAR: S1, S2 normal. No murmurs, rubs, or gallops.  ABDOMEN: Soft, non-tender, non-distended. Bowel sounds present. No organomegaly or mass.  EXTREMITIES: Bilateral pedal edema, no cyanosis, or clubbing.  Redness and tenderness significantly improved as confirmed by patient.   NEUROLOGIC: Cranial nerves II through XII are intact. Muscle strength 5/5 in all extremities. Sensation intact. Gait not checked.  PSYCHIATRIC: The patient is alert and oriented x 3.  SKIN: No obvious rash, lesion, or ulcer.  DATA REVIEW:   CBC Recent Labs  Lab 11/27/18 0602  WBC 8.5  HGB 14.3  HCT 43.3  PLT 221    Chemistries  Recent Labs  Lab 11/26/18 2213  11/29/18 0616  NA 139   < > 138  K 4.1   < > 3.6  CL 100   < > 100  CO2 28   < > 32  GLUCOSE 143*   < > 140*  BUN 17   < > 20  CREATININE 1.01   < > 1.02  CALCIUM 9.5   < > 8.7*  AST 47*  --   --   ALT 66*  --   --   ALKPHOS 92  --   --   BILITOT 1.0  --   --    < > = values in this interval not displayed.     Microbiology Results  Results for orders placed or performed during the hospital encounter of 02/07/18  Culture, blood (routine x 2)     Status: None   Collection Time: 02/07/18  3:20 PM  Result Value Ref  Range Status   Specimen Description BLOOD RT [facility]  Final   Special Requests   Final    BOTTLES DRAWN AEROBIC AND ANAEROBIC Blood Culture adequate volume   Culture   Final    NO GROWTH 5 DAYS Performed at [facility], [address], [place], [place] [address]  Report Status 02/12/2018 FINAL  Final  Culture, blood (routine x 2)     Status: None   Collection Time: 02/07/18  3:20 PM  Result Value Ref Range Status   Specimen Description BLOOD LT [facility]  Final   Special Requests   Final    BOTTLES DRAWN AEROBIC AND ANAEROBIC Blood Culture adequate volume   Culture   Final    NO GROWTH 5 DAYS Performed at [facility], [address] [place], [place] [address]    Report Status 02/12/2018 FINAL  Final    RADIOLOGY:  No results found.   Management plans discussed with the patient, family and they are in agreement.  CODE STATUS: DNR   TOTAL TIME TAKING CARE OF THIS PATIENT: 38 minutes.    Tamra Koos M.D on 11/29/2018 at 10:51 AM  Between 7am to 6pm - Pager - [phone]  After 6pm go to [email or web address] - Proofreader  [facility] [place]  Office  [phone]  CC: Primary care physician; Idelle Crouch, MD   Note: This dictation was prepared with Dragon dictation along with smaller phrase technology. Any transcriptional errors that result from this process are unintentional.

## 2018-11-29 NOTE — Care Management Note (Signed)
Case Management Note  Patient Details  Name: HILARIO ROBARTS MRN: [id number] Date of Birth: 09/26/1946  Subjective/Objective:       Patient is from home alone.  Admitted with bilateral lower extremity cellulitis.  Discharging to home today.  His sister will transport him home.  He is current with his PCP, Dr. Doy Hutching.  He gets his medications at [facility] in [place].  Does not use a walker and states he cannot use one in his home.  Has a CPAP machine.  He has used [facility] in the past and would like to use them again.  Made referral after providing patient with home health list and ratings.  Joelene Millin with [facility] accepted for SN and PT.  She is aware of patient discharge today.  No further needs at this time.              Action/Plan:   Expected Discharge Date:  11/29/18               Expected Discharge Plan:  [place]  In-House Referral:     Discharge planning Services  CM Consult  Post Acute Care Choice:  Home Health Choice offered to:  Patient  DME Arranged:    DME Agency:     HH Arranged:  RN, PT HH Agency:  [facility]  Status of Service:  Completed, signed off  If discussed at [place] of Stay Meetings, dates discussed:    Additional Comments:  Elza Rafter, RN 11/29/2018, 11:46 AM

## 2018-12-02 ENCOUNTER — Ambulatory Visit: Payer: No Typology Code available for payment source

## 2018-12-15 ENCOUNTER — Encounter: Admission: RE | Payer: Self-pay | Source: Home / Self Care

## 2018-12-15 ENCOUNTER — Ambulatory Visit
Admission: RE | Admit: 2018-12-15 | Payer: Medicare Other | Source: Home / Self Care | Admitting: Unknown Physician Specialty

## 2018-12-15 SURGERY — COLONOSCOPY WITH PROPOFOL
Anesthesia: General

## 2018-12-23 ENCOUNTER — Emergency Department: Payer: Medicare Other

## 2018-12-23 ENCOUNTER — Inpatient Hospital Stay
Admission: EM | Admit: 2018-12-23 | Discharge: 2018-12-27 | DRG: 190 | Disposition: A | Discharge status: Home Health | Payer: Medicare Other | Attending: Internal Medicine | Admitting: Internal Medicine

## 2018-12-23 ENCOUNTER — Encounter: Payer: Self-pay | Admitting: Emergency Medicine

## 2018-12-23 ENCOUNTER — Other Ambulatory Visit: Payer: Self-pay

## 2018-12-23 DIAGNOSIS — Z825 Family history of asthma and other chronic lower respiratory diseases: Secondary | ICD-10-CM

## 2018-12-23 DIAGNOSIS — Z79890 Hormone replacement therapy (postmenopausal): Secondary | ICD-10-CM | POA: Diagnosis not present

## 2018-12-23 DIAGNOSIS — G629 Polyneuropathy, unspecified: Secondary | ICD-10-CM | POA: Diagnosis present

## 2018-12-23 DIAGNOSIS — Z9842 Cataract extraction status, left eye: Secondary | ICD-10-CM | POA: Diagnosis not present

## 2018-12-23 DIAGNOSIS — E669 Obesity, unspecified: Secondary | ICD-10-CM | POA: Diagnosis present

## 2018-12-23 DIAGNOSIS — Z7982 Long term (current) use of aspirin: Secondary | ICD-10-CM | POA: Diagnosis not present

## 2018-12-23 DIAGNOSIS — Z66 Do not resuscitate: Secondary | ICD-10-CM | POA: Diagnosis present

## 2018-12-23 DIAGNOSIS — I5033 Acute on chronic diastolic (congestive) heart failure: Secondary | ICD-10-CM | POA: Diagnosis present

## 2018-12-23 DIAGNOSIS — J111 Influenza due to unidentified influenza virus with other respiratory manifestations: Secondary | ICD-10-CM | POA: Diagnosis present

## 2018-12-23 DIAGNOSIS — L03115 Cellulitis of right lower limb: Secondary | ICD-10-CM | POA: Diagnosis present

## 2018-12-23 DIAGNOSIS — F419 Anxiety disorder, unspecified: Secondary | ICD-10-CM | POA: Diagnosis present

## 2018-12-23 DIAGNOSIS — J441 Chronic obstructive pulmonary disease with (acute) exacerbation: Principal | ICD-10-CM | POA: Diagnosis present

## 2018-12-23 DIAGNOSIS — E890 Postprocedural hypothyroidism: Secondary | ICD-10-CM | POA: Diagnosis present

## 2018-12-23 DIAGNOSIS — E876 Hypokalemia: Secondary | ICD-10-CM | POA: Diagnosis present

## 2018-12-23 DIAGNOSIS — Z79899 Other long term (current) drug therapy: Secondary | ICD-10-CM

## 2018-12-23 DIAGNOSIS — F329 Major depressive disorder, single episode, unspecified: Secondary | ICD-10-CM | POA: Diagnosis present

## 2018-12-23 DIAGNOSIS — Z9181 History of falling: Secondary | ICD-10-CM

## 2018-12-23 DIAGNOSIS — I509 Heart failure, unspecified: Secondary | ICD-10-CM

## 2018-12-23 DIAGNOSIS — Z961 Presence of intraocular lens: Secondary | ICD-10-CM | POA: Diagnosis present

## 2018-12-23 DIAGNOSIS — G4733 Obstructive sleep apnea (adult) (pediatric): Secondary | ICD-10-CM | POA: Diagnosis present

## 2018-12-23 DIAGNOSIS — E785 Hyperlipidemia, unspecified: Secondary | ICD-10-CM | POA: Diagnosis present

## 2018-12-23 DIAGNOSIS — Z7951 Long term (current) use of inhaled steroids: Secondary | ICD-10-CM

## 2018-12-23 DIAGNOSIS — R0602 Shortness of breath: Secondary | ICD-10-CM

## 2018-12-23 DIAGNOSIS — Z818 Family history of other mental and behavioral disorders: Secondary | ICD-10-CM

## 2018-12-23 DIAGNOSIS — Z6838 Body mass index (BMI) 38.0-38.9, adult: Secondary | ICD-10-CM | POA: Diagnosis not present

## 2018-12-23 DIAGNOSIS — I110 Hypertensive heart disease with heart failure: Secondary | ICD-10-CM | POA: Diagnosis present

## 2018-12-23 DIAGNOSIS — L03116 Cellulitis of left lower limb: Secondary | ICD-10-CM | POA: Diagnosis present

## 2018-12-23 DIAGNOSIS — Z9841 Cataract extraction status, right eye: Secondary | ICD-10-CM

## 2018-12-23 DIAGNOSIS — Z8249 Family history of ischemic heart disease and other diseases of the circulatory system: Secondary | ICD-10-CM

## 2018-12-23 DIAGNOSIS — F17290 Nicotine dependence, other tobacco product, uncomplicated: Secondary | ICD-10-CM | POA: Diagnosis present

## 2018-12-23 DIAGNOSIS — Z85850 Personal history of malignant neoplasm of thyroid: Secondary | ICD-10-CM

## 2018-12-23 DIAGNOSIS — I5030 Unspecified diastolic (congestive) heart failure: Secondary | ICD-10-CM

## 2018-12-23 DIAGNOSIS — Z8546 Personal history of malignant neoplasm of prostate: Secondary | ICD-10-CM

## 2018-12-23 LAB — CBC WITH DIFFERENTIAL/PLATELET
Abs Immature Granulocytes: 0.07 10*3/uL (ref 0.00–0.07)
BASOS ABS: 0 10*3/uL (ref 0.0–0.1)
Basophils Relative: 0 %
EOS PCT: 2 %
Eosinophils Absolute: 0.2 10*3/uL (ref 0.0–0.5)
HCT: 40.4 % (ref 39.0–52.0)
HEMOGLOBIN: 13.7 g/dL (ref 13.0–17.0)
Immature Granulocytes: 1 %
LYMPHS PCT: 8 %
Lymphs Abs: 0.9 10*3/uL (ref 0.7–4.0)
MCH: 31.1 pg (ref 26.0–34.0)
MCHC: 33.9 g/dL (ref 30.0–36.0)
MCV: 91.8 fL (ref 80.0–100.0)
Monocytes Absolute: 1 10*3/uL (ref 0.1–1.0)
Monocytes Relative: 8 %
Neutro Abs: 9.7 10*3/uL — ABNORMAL HIGH (ref 1.7–7.7)
Neutrophils Relative %: 81 %
Platelets: 153 10*3/uL (ref 150–400)
RBC: 4.4 MIL/uL (ref 4.22–5.81)
RDW: 13.7 % (ref 11.5–15.5)
WBC: 11.9 10*3/uL — ABNORMAL HIGH (ref 4.0–10.5)
nRBC: 0 % (ref 0.0–0.2)

## 2018-12-23 LAB — RESPIRATORY PANEL BY PCR
Adenovirus: NOT DETECTED
Bordetella pertussis: NOT DETECTED
Chlamydophila pneumoniae: NOT DETECTED
Coronavirus 229E: NOT DETECTED
Coronavirus HKU1: NOT DETECTED
Coronavirus NL63: NOT DETECTED
Coronavirus OC43: NOT DETECTED
INFLUENZA A H1 2009-RVPPR: DETECTED — AB
Influenza B: NOT DETECTED
Metapneumovirus: NOT DETECTED
Mycoplasma pneumoniae: NOT DETECTED
Parainfluenza Virus 1: NOT DETECTED
Parainfluenza Virus 2: NOT DETECTED
Parainfluenza Virus 3: NOT DETECTED
Parainfluenza Virus 4: NOT DETECTED
Respiratory Syncytial Virus: NOT DETECTED
Rhinovirus / Enterovirus: NOT DETECTED

## 2018-12-23 LAB — BASIC METABOLIC PANEL
Anion gap: 11 (ref 5–15)
BUN: 13 mg/dL (ref 8–23)
CO2: 27 mmol/L (ref 22–32)
Calcium: 8.6 mg/dL — ABNORMAL LOW (ref 8.9–10.3)
Chloride: 101 mmol/L (ref 98–111)
Creatinine, Ser: 0.92 mg/dL (ref 0.61–1.24)
GFR calc Af Amer: >60 mL/min (ref >60)
GFR calc non Af Amer: >60 mL/min (ref >60)
Glucose, Bld: 134 mg/dL — ABNORMAL HIGH (ref 70–99)
Potassium: 2.9 mmol/L — ABNORMAL LOW (ref 3.5–5.1)
Sodium: 139 mmol/L (ref 135–145)

## 2018-12-23 LAB — TROPONIN I: Troponin I: <0.03 ng/mL (ref <0.03)

## 2018-12-23 LAB — MAGNESIUM: Magnesium: 1.9 mg/dL (ref 1.7–2.4)

## 2018-12-23 LAB — BRAIN NATRIURETIC PEPTIDE: B NATRIURETIC PEPTIDE 5: 89 pg/mL (ref 0.0–100.0)

## 2018-12-23 LAB — INFLUENZA PANEL BY PCR (TYPE A & B)
Influenza A By PCR: POSITIVE — AB
Influenza B By PCR: NEGATIVE

## 2018-12-23 MED ORDER — METHYLPREDNISOLONE SODIUM SUCC 125 MG IJ SOLR: 125.0000 mg | Freq: Once | INTRAMUSCULAR | Status: DC

## 2018-12-23 MED ORDER — OSELTAMIVIR PHOSPHATE 75 MG PO CAPS
75.0000 mg | ORAL_CAPSULE | Freq: Two times a day (BID) | ORAL | Status: DC
  Administered 2018-12-23 – 2018-12-27 (×8): 75 mg via ORAL
  Filled 2018-12-23 (×8): qty 1

## 2018-12-23 MED ORDER — FUROSEMIDE 10 MG/ML IJ SOLN
40.0000 mg | Freq: Two times a day (BID) | INTRAMUSCULAR | Status: DC
  Administered 2018-12-24 – 2018-12-27 (×7): 40 mg via INTRAVENOUS
  Filled 2018-12-23 (×8): qty 4

## 2018-12-23 MED ORDER — IPRATROPIUM-ALBUTEROL 0.5-2.5 (3) MG/3ML IN SOLN
9.0000 mL | Freq: Once | RESPIRATORY_TRACT | Status: AC
  Administered 2018-12-23: 9 mL via RESPIRATORY_TRACT
  Filled 2018-12-23: qty 9

## 2018-12-23 MED ORDER — MAGNESIUM SULFATE 2 GM/50ML IV SOLN
INTRAVENOUS | Status: AC
  Administered 2018-12-23: 2 g via INTRAVENOUS
  Filled 2018-12-23: qty 50

## 2018-12-23 MED ORDER — FLUOXETINE HCL 20 MG PO CAPS
40.0000 mg | ORAL_CAPSULE | Freq: Every day | ORAL | Status: DC
  Administered 2018-12-24 – 2018-12-27 (×4): 40 mg via ORAL
  Filled 2018-12-23 (×4): qty 2

## 2018-12-23 MED ORDER — DIAZEPAM 5 MG PO TABS
5.0000 mg | ORAL_TABLET | Freq: Four times a day (QID) | ORAL | Status: DC | PRN
  Administered 2018-12-23 – 2018-12-26 (×2): 5 mg via ORAL
  Filled 2018-12-23 (×2): qty 1

## 2018-12-23 MED ORDER — ALBUTEROL SULFATE (2.5 MG/3ML) 0.083% IN NEBU
2.5000 mg | INHALATION_SOLUTION | Freq: Once | RESPIRATORY_TRACT | Status: AC
  Administered 2018-12-23: 2.5 mg via RESPIRATORY_TRACT
  Filled 2018-12-23: qty 3

## 2018-12-23 MED ORDER — ENOXAPARIN SODIUM 40 MG/0.4ML ~~LOC~~ SOLN
40.0000 mg | SUBCUTANEOUS | Status: DC
  Administered 2018-12-23 – 2018-12-26 (×4): 40 mg via SUBCUTANEOUS
  Filled 2018-12-23 (×5): qty 0.4

## 2018-12-23 MED ORDER — ONDANSETRON HCL 4 MG PO TABS
4.0000 mg | ORAL_TABLET | Freq: Four times a day (QID) | ORAL | Status: DC | PRN
  Filled 2018-12-23: qty 1

## 2018-12-23 MED ORDER — LOSARTAN POTASSIUM 50 MG PO TABS
50.0000 mg | ORAL_TABLET | Freq: Every day | ORAL | Status: DC
  Administered 2018-12-24 – 2018-12-26 (×4): 50 mg via ORAL
  Filled 2018-12-23 (×3): qty 1

## 2018-12-23 MED ORDER — FUROSEMIDE 10 MG/ML IJ SOLN
40.0000 mg | Freq: Once | INTRAMUSCULAR | Status: AC
  Administered 2018-12-23: 40 mg via INTRAVENOUS
  Filled 2018-12-23: qty 4

## 2018-12-23 MED ORDER — LEVOTHYROXINE SODIUM 100 MCG PO TABS
200.0000 ug | ORAL_TABLET | Freq: Every day | ORAL | Status: DC
  Administered 2018-12-24 – 2018-12-27 (×4): 200 ug via ORAL
  Filled 2018-12-23 (×3): qty 2
  Filled 2018-12-23: qty 1
  Filled 2018-12-23: qty 2

## 2018-12-23 MED ORDER — SODIUM CHLORIDE 0.9 % IV SOLN
500.0000 mg | Freq: Once | INTRAVENOUS | Status: AC
  Administered 2018-12-23: 500 mg via INTRAVENOUS
  Filled 2018-12-23: qty 500

## 2018-12-23 MED ORDER — POTASSIUM CHLORIDE CRYS ER 20 MEQ PO TBCR
40.0000 meq | EXTENDED_RELEASE_TABLET | Freq: Two times a day (BID) | ORAL | Status: DC
  Administered 2018-12-23 (×2): 40 meq via ORAL
  Filled 2018-12-23: qty 2

## 2018-12-23 MED ORDER — GABAPENTIN 100 MG PO CAPS
200.0000 mg | ORAL_CAPSULE | Freq: Three times a day (TID) | ORAL | Status: DC
  Administered 2018-12-23 – 2018-12-27 (×12): 200 mg via ORAL
  Filled 2018-12-23 (×13): qty 2

## 2018-12-23 MED ORDER — FUROSEMIDE 10 MG/ML IJ SOLN: 40.0000 mg | Freq: Once | INTRAMUSCULAR | Status: DC

## 2018-12-23 MED ORDER — ASPIRIN EC 81 MG PO TBEC
81.0000 mg | DELAYED_RELEASE_TABLET | Freq: Every day | ORAL | Status: DC
  Administered 2018-12-24 – 2018-12-27 (×4): 81 mg via ORAL
  Filled 2018-12-23 (×5): qty 1

## 2018-12-23 MED ORDER — AZITHROMYCIN 250 MG PO TABS
250.0000 mg | ORAL_TABLET | Freq: Every day | ORAL | Status: DC
  Administered 2018-12-24 – 2018-12-27 (×4): 250 mg via ORAL
  Filled 2018-12-23 (×4): qty 1

## 2018-12-23 MED ORDER — OXYCODONE-ACETAMINOPHEN 5-325 MG PO TABS
1.0000 | ORAL_TABLET | Freq: Three times a day (TID) | ORAL | Status: DC | PRN
  Administered 2018-12-23 – 2018-12-25 (×5): 1 via ORAL
  Filled 2018-12-23 (×5): qty 1

## 2018-12-23 MED ORDER — ONDANSETRON HCL 4 MG/2ML IJ SOLN: 4.0000 mg | Freq: Four times a day (QID) | INTRAMUSCULAR | Status: DC | PRN

## 2018-12-23 MED ORDER — METHYLPREDNISOLONE SODIUM SUCC 40 MG IJ SOLR
40.0000 mg | Freq: Every day | INTRAMUSCULAR | Status: DC
  Administered 2018-12-24 – 2018-12-26 (×3): 40 mg via INTRAVENOUS
  Filled 2018-12-23 (×3): qty 1

## 2018-12-23 MED ORDER — ACETAMINOPHEN 325 MG PO TABS
650.0000 mg | ORAL_TABLET | Freq: Four times a day (QID) | ORAL | Status: DC | PRN
  Filled 2018-12-23: qty 2

## 2018-12-23 MED ORDER — SIMVASTATIN 20 MG PO TABS
40.0000 mg | ORAL_TABLET | Freq: Every day | ORAL | Status: DC
  Administered 2018-12-23 – 2018-12-26 (×4): 40 mg via ORAL
  Filled 2018-12-23: qty 2
  Filled 2018-12-23: qty 1
  Filled 2018-12-23 (×2): qty 2

## 2018-12-23 MED ORDER — ZOLPIDEM TARTRATE 5 MG PO TABS
5.0000 mg | ORAL_TABLET | Freq: Every evening | ORAL | Status: DC | PRN
  Administered 2018-12-25: 5 mg via ORAL
  Filled 2018-12-23: qty 1

## 2018-12-23 MED ORDER — IPRATROPIUM-ALBUTEROL 0.5-2.5 (3) MG/3ML IN SOLN
3.0000 mL | Freq: Four times a day (QID) | RESPIRATORY_TRACT | Status: DC
  Administered 2018-12-23 – 2018-12-24 (×3): 3 mL via RESPIRATORY_TRACT
  Filled 2018-12-23 (×3): qty 3

## 2018-12-23 MED ORDER — VERAPAMIL HCL ER 180 MG PO TBCR
180.0000 mg | EXTENDED_RELEASE_TABLET | Freq: Two times a day (BID) | ORAL | Status: DC
  Administered 2018-12-23 – 2018-12-27 (×8): 180 mg via ORAL
  Filled 2018-12-23 (×10): qty 1

## 2018-12-23 MED ORDER — BUDESONIDE 0.5 MG/2ML IN SUSP
0.5000 mg | Freq: Two times a day (BID) | RESPIRATORY_TRACT | Status: DC
  Administered 2018-12-23 – 2018-12-27 (×7): 0.5 mg via RESPIRATORY_TRACT
  Filled 2018-12-23 (×7): qty 2

## 2018-12-23 MED ORDER — ACETAMINOPHEN 650 MG RE SUPP
650.0000 mg | Freq: Four times a day (QID) | RECTAL | Status: DC | PRN
  Filled 2018-12-23: qty 1

## 2018-12-23 MED ORDER — MAGNESIUM SULFATE 2 GM/50ML IV SOLN
2.0000 g | Freq: Once | INTRAVENOUS | Status: AC
  Administered 2018-12-23: 2 g via INTRAVENOUS

## 2018-12-23 NOTE — H&P (Signed)
[place] at [place] NAME: [name] [name]    MR#:  [id number]  DATE OF BIRTH:  04/14/1946  DATE OF ADMISSION:  12/23/2018  PRIMARY CARE PHYSICIAN: Idelle Crouch, MD   REQUESTING/REFERRING PHYSICIAN: Dr Lucita Lora  CHIEF COMPLAINT:   Chief Complaint  Patient presents with  . Shortness of Breath    HISTORY OF PRESENT ILLNESS:  [name] [name]  is a 73 y.o. male with a known history of COPD presents with shortness of breath.  He states the breathing difficulties have been going on for weeks.  He has been battling a cellulitis of his lower extremities for 2 rounds of antibiotics.  He has been coughing nonproductive phlegm.  Over the last 4 days started to be a little more productive.  He has had no sleep in 20 hours.  Poor appetite.  Shortness of breath.  Positive for wheezing.  Feeling very fatigued.  PAST MEDICAL HISTORY:   Past Medical History:  Diagnosis Date  . Anxiety   . Anxiety disorder 05/05/2014   unspecifed  . Cancer [facility])    prostate cancer   . Colonic polyp   . COPD (chronic obstructive pulmonary disease) ([place])   . Depression   . Dyspnea   . Elevated prostate specific antigen (PSA)   . Essential (primary) hypertension 05/05/2014  . 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 GI bleed    due to diverticulitis   . Heart murmur    hx of 1960s  . History of thyroid cancer 05/05/2014  . Hypercholesteremia   . Hyperlipidemia   . Hypertension   . Hypothyroidism 05/05/2014  . Impotence of organic origin   . Male erectile dysfunction 12/29/2012  . Malignant neoplasm of prostate ([place])    10/30/2011 T1c - Identified by needle biopsy . Gleason 7 (3+4) 5 of 12 cores, Bilateral   . Prediabetes 06/22/2017  . Prostate cancer ([place])   . Shortness of breath on exertion 07/11/2015  . Sleep apnea    CPAP , report on chart , not used in last 2 weeks   . Thyroid cancer ([place])   . Thyroid disease   . Urinary obstruction    not  elsewhere classified    PAST SURGICAL HISTORY:   Past Surgical History:  Procedure Laterality Date  . BACK SURGERY     lumbar 1987   . BACK SURGERY     C-6- Plate&Pin  . CATARACT EXTRACTION W/PHACO Right 09/15/2018   Procedure: CATARACT EXTRACTION PHACO AND INTRAOCULAR LENS PLACEMENT ([place]) RIGHT;  Surgeon: Leandrew Koyanagi, MD;  Location: [facility];  Service: Ophthalmology;  Laterality: Right;  Requests to be last case  . CATARACT EXTRACTION W/PHACO Left 10/06/2018   Procedure: CATARACT EXTRACTION PHACO AND INTRAOCULAR LENS PLACEMENT ([place])  LEFT;  Surgeon: Leandrew Koyanagi, MD;  Location: [facility];  Service: Ophthalmology;  Laterality: Left;  . CERVICAL DISC SURGERY    . COLONOSCOPY     10/06/2011, 06/05/2006, 05/06/2002 adematous polyps: CBF 09/2014; Recall Ltr mailed 02/27/2015 (dw)  . COLONOSCOPY WITH PROPOFOL N/A 07/06/2015   Procedure: COLONOSCOPY WITH PROPOFOL;  Surgeon: Manya Silvas, MD;  Location[facility] ENDOSCOPY;  Service: Endoscopy;  Laterality: N/A;  . COLONOSCOPY WITH PROPOFOL N/A 09/01/2018   Procedure: COLONOSCOPY WITH PROPOFOL;  Surgeon: Manya Silvas, MD;  Location: [facility] ENDOSCOPY;  Service: Endoscopy;  Laterality: N/A;  . HERNIA REPAIR     umbilical hernia repair   . LAMINECTOMY FOR EXCISION / EVACUATION INTRASPINAL LESION  lumbar  . OTHER SURGICAL HISTORY     right rotator cuff surgery   . OTHER SURGICAL HISTORY     bilateral tubes in ears   . POSTERIOR LAMINECTOMY / DECOMPRESSION CERVICAL SPINE    . PROSTATE BIOPSY     10/22/2011 Volume:55.8 cc's, PSA:4.5, Free PSA:12%  . ROBOT ASSISTED LAPAROSCOPIC RADICAL PROSTATECTOMY  12/15/2011   Procedure: ROBOTIC ASSISTED LAPAROSCOPIC RADICAL PROSTATECTOMY LEVEL 2;  Surgeon: Dutch Gray, MD;  Location: [facility];  Service: Urology;  Laterality: N/A;      . ROTATOR CUFF REPAIR Right   . TONSILLECTOMY    . TOTAL THYROIDECTOMY      SOCIAL HISTORY:   Social History   Tobacco Use  .  Smoking status: Former Smoker    Packs/day: 1.00    Years: 35.00    Pack years: 35.00  . Smokeless tobacco: Current User  . Tobacco comment: Vapor  Substance Use Topics  . Alcohol use: Yes    Alcohol/week: 2.0 standard drinks    Types: 1 Glasses of wine, 1 Shots of liquor per week    FAMILY HISTORY:   Family History  Problem Relation Age of Onset  . Hypertension Mother   . Anxiety disorder Mother   . Stroke Mother        "mini strokes"  . Coronary artery disease Mother   . Heart attack Mother   . Emphysema Father   . Heart disease Father   . Scoliosis Father   . Coronary artery disease Father   . GU problems Neg Hx   . Kidney disease Neg Hx   . Prostate cancer Neg Hx     DRUG ALLERGIES:  No Known Allergies  REVIEW OF SYSTEMS:  CONSTITUTIONAL: No fever, chills or sweats.  Positive for weight up and down over the last couple days.  Positive for fatigue.  EYES: No blurred or double vision.  EARS, NOSE, AND THROAT: No tinnitus or ear pain. No sore throat RESPIRATORY: Positive for cough, shortness of breath, and wheezing.no hemoptysis.  CARDIOVASCULAR: No chest pain.positive for edema.  GASTROINTESTINAL: No nausea, vomiting, diarrhea or abdominal pain. No blood in bowel movements GENITOURINARY: No dysuria, hematuria.  ENDOCRINE: No polyuria, nocturia,  HEMATOLOGY: No anemia, easy bruising or bleeding SKIN: No rash or lesion. MUSCULOSKELETAL: Positive for back pain and leg pain and neck pain NEUROLOGIC: No tingling, numbness, weakness.  PSYCHIATRY: History of depression  MEDICATIONS AT HOME:   Prior to Admission medications   Medication Sig Start Date End Date Taking? Authorizing Provider  acetaminophen (TYLENOL) 325 MG tablet Take 650 mg by mouth 3 (three) times daily.     [provider]  albuterol (VENTOLIN HFA) 108 (90 Base) MCG/ACT inhaler Inhale 2 puffs into the lungs every 6 (six) hours as needed for wheezing or shortness of breath.    [provider]  aspirin EC 81 MG tablet Take 81 mg by mouth daily.     [provider]  budesonide-formoterol (SYMBICORT) 160-4.5 MCG/ACT inhaler Inhale 2 puffs into the lungs 2 (two) times daily.     [provider]  diazepam (VALIUM) 5 MG tablet Take 5 mg by mouth every 6 (six) hours as needed for muscle spasms.     [provider]  Eszopiclone (ESZOPICLONE) 3 MG TABS Take 3 mg by mouth at bedtime as needed (sleep).     [provider]  FLUoxetine (PROZAC) 40 MG capsule Take 40 mg by mouth daily.    [provider]  furosemide (LASIX) 20 MG tablet Take 1 tablet (20 mg total) by mouth daily. 11/30/18   Stark Jock Jude, MD  gabapentin (NEURONTIN) 100 MG capsule Take 200 mg by mouth 3 (three) times daily.    [provider]  ipratropium-albuterol (DUONEB) 0.5-2.5 (3) MG/3ML SOLN Take 3 mLs by nebulization every 6 (six) hours as needed (Shortness of breath, wheezing.). 02/10/18   Henreitta Leber, MD  levothyroxine (SYNTHROID, LEVOTHROID) 200 MCG tablet Take 200 mcg by mouth daily before breakfast.    [provider]  losartan-hydrochlorothiazide (HYZAAR) 50-12.5 MG per tablet Take 1 tablet by mouth daily.     [provider]  oxyCODONE-acetaminophen (PERCOCET/ROXICET) 5-325 MG tablet Take 1 tablet by mouth every 8 (eight) hours as needed for moderate pain or severe pain. 11/29/18   Stark Jock Jude, MD  simvastatin (ZOCOR) 40 MG tablet Take 40 mg by mouth daily.    [provider]  verapamil (CALAN-SR) 180 MG CR tablet Take 180 mg by mouth 2 (two) times daily.     [provider]   Medication reconciliation undergoing  VITAL SIGNS:  Blood pressure (!) 154/69, pulse 75, temperature 98.2 F (36.8 C), temperature source Oral, resp. rate 17, height 6\' 2"  (1.88 m), weight 134.7 kg, SpO2 94 %.  PHYSICAL EXAMINATION:  GENERAL:  73 y.o.-year-old patient lying in the bed with no acute distress.  EYES: Pupils equal, round,  reactive to light and accommodation. No scleral icterus. Extraocular muscles intact.  HEENT: Head atraumatic, normocephalic. Oropharynx and nasopharynx clear.  NECK:  Supple, no jugular venous distention. No thyroid enlargement, no tenderness.  LUNGS: Decreased breath sounds bilaterally, positive wheezing throughout entire lung field.  No rales,rhonchi or crepitation. No use of accessory muscles of respiration.  CARDIOVASCULAR: S1, S2 normal. No murmurs, rubs, or gallops.  ABDOMEN: Soft, nontender, nondistended. Bowel sounds present. No organomegaly or mass.  EXTREMITIES: 2+ pedal edema, no cyanosis, or clubbing.  NEUROLOGIC: Cranial nerves II through XII are intact. Muscle strength 5/5 in all extremities. Sensation intact. Gait not checked.  PSYCHIATRIC: The patient is alert and oriented x 3.  SKIN: Chronic lower extremity discoloration.  I do not see any residual cellulitis at this time.  Some scabs on his lower extremities more on the left than the right  LABORATORY PANEL:   CBC Recent Labs  Lab 12/23/18 1126  WBC 11.9*  HGB 13.7  HCT 40.4  PLT 153   ------------------------------------------------------------------------------------------------------------------  Chemistries  Recent Labs  Lab 12/23/18 1126  NA 139  K 2.9*  CL 101  CO2 27  GLUCOSE 134*  BUN 13  CREATININE 0.92  CALCIUM 8.6*   ------------------------------------------------------------------------------------------------------------------  Cardiac Enzymes Recent Labs  Lab 12/23/18 1126  TROPONINI <0.03   ------------------------------------------------------------------------------------------------------------------  RADIOLOGY:  Dg Chest 2 View  Result Date: 12/23/2018 CLINICAL DATA:  Shortness of breath. EXAM: CHEST - 2 VIEW COMPARISON:  11/27/2018. FINDINGS: Mediastinum hilar structures normal. Cardiomegaly with normal pulmonary vascularity. No focal infiltrate. Mild basilar subsegmental  atelectasis. No pleural effusion or pneumothorax. Not be excluded. No pleural effusion or pneumothorax. Diffuse osteopenia and degenerative change. Mild thoracic spine compression fractures noted, age undetermined. IMPRESSION: 1.  Cardiomegaly with mild pulmonary venous congestion. 2.  Mild thoracic spine compression fractures, age undetermined. Electronically Signed   By: Marcello Moores  Register   On: 12/23/2018 12:09    EKG:   Sinus rhythm 76 bpm, right bundle branch block, left anterior fascicular block.  IMPRESSION AND PLAN:   1.  Acute on chronic diastolic congestive heart  failure.  Continue Lasix 40 mg IV twice daily.  We will hold off on beta-blocker with bronchospasm and wheeze.  Patient on losartan. 2.  COPD exacerbation.  Patient given 125 mg of Solu-Medrol and Zithromax here in the ED.  We will continue Solu-Medrol 40 mg IV daily.  Continue nebulizer treatments with budesonide and DuoNeb.  Check flu swab and respiratory panel. 3.  Hypokalemia.  Replace potassium orally 40 mg twice daily.  Replace magnesium and check a magnesium level. 4.  Hypothyroidism with history of thyroid cancer on levothyroxine 5.  Obesity with a BMI of 38.13.  Weight loss needed 6.  Hyperlipidemia unspecified on Zocor 7.  Neuropathy on gabapentin    All the records are reviewed and case discussed with ED provider. Management plans discussed with the patient, and he is in agreement.  CODE STATUS: DNR  TOTAL TIME TAKING CARE OF THIS PATIENT: 50 minutes, including ACP time.    Loletha Grayer M.D on 12/23/2018 at 1:53 PM  Between 7am to 6pm - Pager - [phone]  After 6pm call admission pager [phone]  [facility] Office  [phone]  CC: Primary care physician; Idelle Crouch, MD

## 2018-12-23 NOTE — Progress Notes (Signed)
Patient ID: [name] [name], male   DOB: [birth year], 73 y.o.   MRN: [id number]  ACP note  Patient present  Diagnosis acute diastolic congestive heart failure, COPD exacerbation, hypokalemia, hypothyroidism, obesity, hyperlipidemia, neuropathy  CODE STATUS discussed and patient is a DNR  Plan.  Diuresis with IV Lasix.  Treatment for COPD exacerbation with steroids and nebulizer treatments and p.o. Zithromax  Time spent on ACP discussion 17 minutes Dr. Loletha Grayer

## 2018-12-23 NOTE — ED Notes (Signed)
Pt provided dinner tray.

## 2018-12-23 NOTE — ED Triage Notes (Addendum)
Pt presents to ED via [facility] from home c/o SOB x1 week. Pt states he had a PCP appt today at 1500 but didn't think he could wait until then. Reports congested wet cough. Bil rhonchi and wheezing noted. Pt given x1 duoneb and 125mg  solu-medrol PTA by EMS.

## 2018-12-23 NOTE — Progress Notes (Signed)
Patient ID: [name] [name], male   DOB: December 18, 1945, 73 y.o.   MRN: [id number]  Influenza positive.  Start Tamiflu.

## 2018-12-23 NOTE — ED Notes (Signed)
ED TO INPATIENT HANDOFF REPORT  Name/Age/Gender [name] [name] 73 y.o. male  Code Status    Code Status Orders  (From admission, onward)         Start     Ordered   12/23/18 1350  Do not attempt resuscitation (DNR)  Continuous    Question Answer Comment  In the event of cardiac or respiratory ARREST Do not call a "code blue"   In the event of cardiac or respiratory ARREST Do not perform Intubation, CPR, defibrillation or ACLS   In the event of cardiac or respiratory ARREST Use medication by any route, position, wound care, and other measures to relive pain and suffering. May use oxygen, suction and manual treatment of airway obstruction as needed for comfort.   Comments NUrse may pronounce      12/23/18 1350        Code Status History    Date Active Date Inactive Code Status Order ID Comments User Context   11/28/2018 2229 11/29/2018 1737 DNR [id number]  Vaughan Basta, MD Inpatient   11/27/2018 0218 11/28/2018 2229 Full Code [id number]  Lance Coon, MD Inpatient   02/07/2018 1419 02/10/2018 2126 Full Code [id number]  Epifanio Lesches, MD ED    Advance Directive Documentation     Most Recent Value  Type of Advance Directive  Healthcare Power of Attorney, Living will  Pre-existing out of facility DNR order (yellow form or pink MOST form)  -  "MOST" Form in Place?  -        Chief Complaint sob  Level of Care/Admitting Diagnosis ED Disposition    ED Disposition Condition [place] [[id number]]  Level of Care: Telemetry [5]  Diagnosis: CHF (congestive heart failure) [facility]) [[id number]]  Admitting Physician: Loletha Grayer [[id number]]  Attending Physician: Loletha Grayer [phone]  Estimated length of stay: past midnight tomorrow  Certification:: I certify this patient will need inpatient services for at least 2 midnights  PT Class (Do Not Modify): Inpatient [101]  PT Acc Code (Do Not Modify): Private [1]        Medical History Past Medical History:  Diagnosis Date  . Anxiety   . Anxiety disorder 05/05/2014   unspecifed  . Cancer [facility])    prostate cancer   . Colonic polyp   . COPD (chronic obstructive pulmonary disease) ([place])   . Depression   . Dyspnea   . Elevated prostate specific antigen (PSA)   . Essential (primary) hypertension 05/05/2014  . 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 GI bleed    due to diverticulitis   . Heart murmur    hx of 1960s  . History of thyroid cancer 05/05/2014  . Hypercholesteremia   . Hyperlipidemia   . Hypertension   . Hypothyroidism 05/05/2014  . Impotence of organic origin   . Male erectile dysfunction 12/29/2012  . Malignant neoplasm of prostate ([place])    10/30/2011 T1c - Identified by needle biopsy . Gleason 7 (3+4) 5 of 12 cores, Bilateral   . Prediabetes 06/22/2017  . Prostate cancer ([place])   . Shortness of breath on exertion 07/11/2015  . Sleep apnea    CPAP , report on chart , not used in last 2 weeks   . Thyroid cancer ([place])   . Thyroid disease   . Urinary obstruction    not elsewhere classified    Allergies No Known Allergies  IV Location/Drains/Wounds Patient Lines/Drains/Airways Status   Active Line/Drains/Airways    Name:  Placement date:   Placement time:   Site:   Days:   Peripheral IV 12/23/18 Right Antecubital   12/23/18    1150    Antecubital   less than 1   Incision (Closed) 09/15/18 Eye Right   09/15/18    1246     99   Incision (Closed) 10/06/18 Eye Left   10/06/18    1023     78          Labs/Imaging Results for orders placed or performed during the hospital encounter of 12/23/18 (from the past 48 hour(s))  CBC with Differential     Status: Abnormal   Collection Time: 12/23/18 11:26 AM  Result Value Ref Range   WBC 11.9 (H) 4.0 - 10.5 K/uL   RBC 4.40 4.22 - 5.81 MIL/uL   Hemoglobin 13.7 13.0 - 17.0 g/dL   HCT 40.4 39.0 - 52.0 %   MCV 91.8 80.0 - 100.0 fL   MCH 31.1 26.0 - 34.0 pg   MCHC 33.9 30.0 -  36.0 g/dL   RDW 13.7 11.5 - 15.5 %   Platelets 153 150 - 400 K/uL   nRBC 0.0 0.0 - 0.2 %   Neutrophils Relative % 81 %   Neutro Abs 9.7 (H) 1.7 - 7.7 K/uL   Lymphocytes Relative 8 %   Lymphs Abs 0.9 0.7 - 4.0 K/uL   Monocytes Relative 8 %   Monocytes Absolute 1.0 0.1 - 1.0 K/uL   Eosinophils Relative 2 %   Eosinophils Absolute 0.2 0.0 - 0.5 K/uL   Basophils Relative 0 %   Basophils Absolute 0.0 0.0 - 0.1 K/uL   Immature Granulocytes 1 %   Abs Immature Granulocytes 0.07 0.00 - 0.07 K/uL    Comment: Performed at [facility], [address] [place], [place] [address]  Basic metabolic panel     Status: Abnormal   Collection Time: 12/23/18 11:26 AM  Result Value Ref Range   Sodium 139 135 - 145 mmol/L   Potassium 2.9 (L) 3.5 - 5.1 mmol/L   Chloride 101 98 - 111 mmol/L   CO2 27 22 - 32 mmol/L   Glucose, Bld 134 (H) 70 - 99 mg/dL   BUN 13 8 - 23 mg/dL   Creatinine, Ser 0.92 0.61 - 1.24 mg/dL   Calcium 8.6 (L) 8.9 - 10.3 mg/dL   GFR calc non Af Amer >60 >60 mL/min   GFR calc Af Amer >60 >60 mL/min   Anion gap 11 5 - 15    Comment: Performed at [facility], [address] [place], [place] [address]  Brain natriuretic peptide     Status: None   Collection Time: 12/23/18 11:26 AM  Result Value Ref Range   B Natriuretic Peptide 89.0 0.0 - 100.0 pg/mL    Comment: Performed at [facility], [address] [place], [place] [address]  Troponin I - ONCE - STAT     Status: None   Collection Time: 12/23/18 11:26 AM  Result Value Ref Range   Troponin I <0.03 <0.03 ng/mL    Comment: Performed at [facility], [address] [place], [place] [address]  Magnesium     Status: None   Collection Time: 12/23/18 11:26 AM  Result Value Ref Range   Magnesium 1.9 1.7 - 2.4 mg/dL    Comment: Performed at [facility], [address] [place], [place] [address]  Influenza panel by PCR (type A & B)  Status: Abnormal   Collection Time: 12/23/18   2:37 PM  Result Value Ref Range   Influenza A By PCR POSITIVE (A) NEGATIVE   Influenza B By PCR NEGATIVE NEGATIVE    Comment: (NOTE) The Xpert Xpress Flu assay is intended as an aid in the diagnosis of  influenza and should not be used as a sole basis for treatment.  This  assay is FDA approved for nasopharyngeal swab specimens only. Nasal  washings and aspirates are unacceptable for Xpert Xpress Flu testing. Performed at [facility], [address], [place], [place] [address]    Dg Chest 2 View  Result Date: 12/23/2018 CLINICAL DATA:  Shortness of breath. EXAM: CHEST - 2 VIEW COMPARISON:  11/27/2018. FINDINGS: Mediastinum hilar structures normal. Cardiomegaly with normal pulmonary vascularity. No focal infiltrate. Mild basilar subsegmental atelectasis. No pleural effusion or pneumothorax. Not be excluded. No pleural effusion or pneumothorax. Diffuse osteopenia and degenerative change. Mild thoracic spine compression fractures noted, age undetermined. IMPRESSION: 1.  Cardiomegaly with mild pulmonary venous congestion. 2.  Mild thoracic spine compression fractures, age undetermined. Electronically Signed   By: Marcello Moores  Register   On: 12/23/2018 12:09    Pending Labs Unresulted Labs (From admission, onward)    Start     Ordered   12/30/18 0500  Creatinine, serum  (enoxaparin (LOVENOX)    CrCl >/= 30 ml/min)  Weekly,   STAT    Comments:  while on enoxaparin therapy    12/23/18 1350   12/24/18 6269  Basic metabolic panel  Tomorrow morning,   STAT     12/23/18 1350   12/24/18 0500  CBC  Tomorrow morning,   STAT     12/23/18 1350   12/23/18 1330  Respiratory Panel by PCR  (Respiratory virus panel with precautions)  Once,   STAT     12/23/18 1329          Vitals/Pain Today's Vitals   12/23/18 1454 12/23/18 1504 12/23/18 1630 12/23/18 1855  BP: (!) 155/66  (!) 167/76   Pulse: 90 91  90  Resp: (!) 30 20 (!) 23 18  Temp:      TempSrc:      SpO2: 92% 94% 93% 95%  Weight:       Height:      PainSc:        Isolation Precautions Droplet precaution  Medications Medications  methylPREDNISolone sodium succinate (SOLU-MEDROL) 125 mg/2 mL injection 125 mg (0 mg Intravenous Hold 12/23/18 1356)  potassium chloride SA (K-DUR,KLOR-CON) CR tablet 40 mEq (40 mEq Oral Given 12/23/18 1432)  enoxaparin (LOVENOX) injection 40 mg (has no administration in time range)  acetaminophen (TYLENOL) tablet 650 mg (has no administration in time range)    Or  acetaminophen (TYLENOL) suppository 650 mg (has no administration in time range)  ondansetron (ZOFRAN) tablet 4 mg (has no administration in time range)    Or  ondansetron (ZOFRAN) injection 4 mg (has no administration in time range)  azithromycin (ZITHROMAX) tablet 250 mg (has no administration in time range)  ipratropium-albuterol (DUONEB) 0.5-2.5 (3) MG/3ML nebulizer solution 3 mL (0 mLs Nebulization Hold 12/23/18 1357)  budesonide (PULMICORT) nebulizer solution 0.5 mg (has no administration in time range)  aspirin EC tablet 81 mg (has no administration in time range)  oxyCODONE-acetaminophen (PERCOCET/ROXICET) 5-325 MG per tablet 1 tablet (1 tablet Oral Given 12/23/18 1446)  simvastatin (ZOCOR) tablet 40 mg (40 mg Oral Given 12/23/18 1726)  verapamil (CALAN-SR) CR tablet 180 mg (has no administration  in time range)  diazepam (VALIUM) tablet 5 mg (has no administration in time range)  zolpidem (AMBIEN) tablet 5 mg (has no administration in time range)  FLUoxetine (PROZAC) capsule 40 mg (has no administration in time range)  levothyroxine (SYNTHROID, LEVOTHROID) tablet 200 mcg (has no administration in time range)  gabapentin (NEURONTIN) capsule 200 mg (200 mg Oral Given 12/23/18 1812)  furosemide (LASIX) injection 40 mg (has no administration in time range)  methylPREDNISolone sodium succinate (SOLU-MEDROL) 40 mg/mL injection 40 mg (has no administration in time range)  losartan (COZAAR) tablet 50 mg (has no administration in time  range)  oseltamivir (TAMIFLU) capsule 75 mg (75 mg Oral Given 12/23/18 1726)  albuterol (PROVENTIL) (2.5 MG/3ML) 0.083% nebulizer solution 2.5 mg (2.5 mg Nebulization Given 12/23/18 1132)  furosemide (LASIX) injection 40 mg (40 mg Intravenous Given 12/23/18 1227)  ipratropium-albuterol (DUONEB) 0.5-2.5 (3) MG/3ML nebulizer solution 9 mL (9 mLs Nebulization Given 12/23/18 1358)  azithromycin (ZITHROMAX) 500 mg in sodium chloride 0.9 % 250 mL IVPB (0 mg Intravenous Stopped 12/23/18 1537)  magnesium sulfate IVPB 2 g 50 mL (0 g Intravenous Stopped 12/23/18 1534)

## 2018-12-23 NOTE — ED Provider Notes (Signed)
[facility] Emergency Department Provider Note  ____________________________________________   First MD Initiated Contact with Patient 12/23/18 1119     (approximate)  I have reviewed the triage vital signs and the nursing notes.   HISTORY  Chief Complaint Shortness of Breath   HPI [name] [name] is a 73 y.o. male with a history of COPD, GI bleed, hyperlipidemia as well as hypertension who is presenting to the emergency department today complaining of shortness of breath.  He states that he has been having shortness of breath over the past several weeks but it has worsened over the past 2 to 3 days.  Patient says that he is been unable to sleep over the past 30 hours.  Denying any pain.  Denies any swelling to the bilateral lower extremities.   Past Medical History:  Diagnosis Date  . Anxiety   . Anxiety disorder 05/05/2014   unspecifed  . Cancer [facility])    prostate cancer   . Colonic polyp   . COPD (chronic obstructive pulmonary disease) ([place])   . Depression   . Dyspnea   . Elevated prostate specific antigen (PSA)   . Essential (primary) hypertension 05/05/2014  . 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 GI bleed    due to diverticulitis   . Heart murmur    hx of 1960s  . History of thyroid cancer 05/05/2014  . Hypercholesteremia   . Hyperlipidemia   . Hypertension   . Hypothyroidism 05/05/2014  . Impotence of organic origin   . Male erectile dysfunction 12/29/2012  . Malignant neoplasm of prostate ([place])    10/30/2011 T1c - Identified by needle biopsy . Gleason 7 (3+4) 5 of 12 cores, Bilateral   . Prediabetes 06/22/2017  . Prostate cancer ([place])   . Shortness of breath on exertion 07/11/2015  . Sleep apnea    CPAP , report on chart , not used in last 2 weeks   . Thyroid cancer ([place])   . Thyroid disease   . Urinary obstruction    not elsewhere classified    Patient Active Problem List   Diagnosis Date Noted  . Acute on chronic  diastolic CHF (congestive heart failure) ([place]) 11/27/2018  . Cellulitis 11/27/2018  . Hypothyroidism 11/27/2018  . OSA (obstructive sleep apnea) 11/27/2018  . HTN (hypertension) 11/27/2018  . HLD (hyperlipidemia) 11/27/2018  . Protein-calorie malnutrition ([place]) 03/02/2018  . Neuropathy 03/02/2018  . 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  . Difficulty in voiding 03/01/2018  . Vitamin D deficiency 03/01/2018  . Vitamin B12 deficiency 03/01/2018  . Insomnia 03/01/2018  . COPD (chronic obstructive pulmonary disease) ([place]) 02/11/2018  . Pain due to onychomycosis of nail 02/11/2018  . Onychomycosis 02/11/2018  . Traumatic ecchymosis of foot, right, initial encounter 02/11/2018  . Xerostomia 02/11/2018  . Frequent falls 02/11/2018  . Acute respiratory failure with hypoxemia ([place]) 02/07/2018    Past Surgical History:  Procedure Laterality Date  . BACK SURGERY     lumbar 1987   . BACK SURGERY     C-6- Plate&Pin  . CATARACT EXTRACTION W/PHACO Right 09/15/2018   Procedure: CATARACT EXTRACTION PHACO AND INTRAOCULAR LENS PLACEMENT ([place]) RIGHT;  Surgeon: Leandrew Koyanagi, MD;  Location: [facility];  Service: Ophthalmology;  Laterality: Right;  Requests to be last case  . CATARACT EXTRACTION W/PHACO Left 10/06/2018   Procedure: CATARACT EXTRACTION PHACO AND INTRAOCULAR LENS PLACEMENT ([place])  LEFT;  Surgeon: Leandrew Koyanagi, MD;  Location: [place];  Service: Ophthalmology;  Laterality: Left;  .  CERVICAL DISC SURGERY    . COLONOSCOPY     10/06/2011, 06/05/2006, 05/06/2002 adematous polyps: CBF 09/2014; Recall Ltr mailed 02/27/2015 (dw)  . COLONOSCOPY WITH PROPOFOL N/A 07/06/2015   Procedure: COLONOSCOPY WITH PROPOFOL;  Surgeon: Manya Silvas, MD;  Location: [facility] ENDOSCOPY;  Service: Endoscopy;  Laterality: N/A;  . COLONOSCOPY WITH PROPOFOL N/A 09/01/2018   Procedure: COLONOSCOPY WITH PROPOFOL;  Surgeon: Manya Silvas, MD;  [facility];  Service:  Endoscopy;  Laterality: N/A;  . HERNIA REPAIR     umbilical hernia repair   . LAMINECTOMY FOR EXCISION / EVACUATION INTRASPINAL LESION     lumbar  . OTHER SURGICAL HISTORY     right rotator cuff surgery   . OTHER SURGICAL HISTORY     bilateral tubes in ears   . POSTERIOR LAMINECTOMY / DECOMPRESSION CERVICAL SPINE    . PROSTATE BIOPSY     10/22/2011 Volume:55.8 cc's, PSA:4.5, Free PSA:12%  . ROBOT ASSISTED LAPAROSCOPIC RADICAL PROSTATECTOMY  12/15/2011   Procedure: ROBOTIC ASSISTED LAPAROSCOPIC RADICAL PROSTATECTOMY LEVEL 2;  Surgeon: Dutch Gray, MD;  Location: [facility] ORS;  Service: Urology;  Laterality: N/A;      . ROTATOR CUFF REPAIR Right   . TONSILLECTOMY    . TOTAL THYROIDECTOMY      Prior to Admission medications   Medication Sig Start Date End Date Taking? Authorizing Provider  acetaminophen (TYLENOL) 325 MG tablet Take 650 mg by mouth 3 (three) times daily.     [provider]  albuterol (VENTOLIN HFA) 108 (90 Base) MCG/ACT inhaler Inhale 2 puffs into the lungs every 6 (six) hours as needed for wheezing or shortness of breath.    [provider]  aspirin EC 81 MG tablet Take 81 mg by mouth daily.     [provider]  budesonide-formoterol (SYMBICORT) 160-4.5 MCG/ACT inhaler Inhale 2 puffs into the lungs 2 (two) times daily.     [provider]  diazepam (VALIUM) 5 MG tablet Take 5 mg by mouth every 6 (six) hours as needed for muscle spasms.     [provider]  Eszopiclone (ESZOPICLONE) 3 MG TABS Take 3 mg by mouth at bedtime as needed (sleep).     [provider]  FLUoxetine (PROZAC) 40 MG capsule Take 40 mg by mouth daily.    [provider]  furosemide (LASIX) 20 MG tablet Take 1 tablet (20 mg total) by mouth daily. 11/30/18   Stark Jock Jude, MD  gabapentin (NEURONTIN) 100 MG capsule Take 200 mg by mouth 3 (three) times daily.    [provider]  ipratropium-albuterol (DUONEB) 0.5-2.5 (3) MG/3ML SOLN Take 3 mLs by  nebulization every 6 (six) hours as needed (Shortness of breath, wheezing.). 02/10/18   Henreitta Leber, MD  levothyroxine (SYNTHROID, LEVOTHROID) 200 MCG tablet Take 200 mcg by mouth daily before breakfast.    [provider]  losartan-hydrochlorothiazide (HYZAAR) 50-12.5 MG per tablet Take 1 tablet by mouth daily.     [provider]  oxyCODONE-acetaminophen (PERCOCET/ROXICET) 5-325 MG tablet Take 1 tablet by mouth every 8 (eight) hours as needed for moderate pain or severe pain. 11/29/18   Stark Jock Jude, MD  simvastatin (ZOCOR) 40 MG tablet Take 40 mg by mouth daily.    [provider]  verapamil (CALAN-SR) 180 MG CR tablet Take 180 mg by mouth 2 (two) times daily.     [provider]    Allergies Patient has no known allergies.  Family History  Problem Relation Age of  Onset  . Hypertension Mother   . Anxiety disorder Mother   . Stroke Mother        "mini strokes"  . Coronary artery disease Mother   . Heart attack Mother   . Emphysema Father   . Heart disease Father   . Scoliosis Father   . Coronary artery disease Father   . GU problems Neg Hx   . Kidney disease Neg Hx   . Prostate cancer Neg Hx     Social History Social History   Tobacco Use  . Smoking status: Former Smoker    Packs/day: 1.00    Years: 35.00    Pack years: 35.00  . Smokeless tobacco: Current User  . Tobacco comment: Vapor  Substance Use Topics  . Alcohol use: Yes    Alcohol/week: 2.0 standard drinks    Types: 1 Glasses of wine, 1 Shots of liquor per week  . Drug use: No    Review of Systems  Constitutional: No fever/chills Eyes: No visual changes. ENT: No sore throat. Cardiovascular: Denies chest pain. Respiratory: As above Gastrointestinal: No abdominal pain.  No nausea, no vomiting.  No diarrhea.  No constipation. Genitourinary: Negative for dysuria. Musculoskeletal: Negative for back pain. Skin: Negative for rash. Neurological: Negative for headaches,  focal weakness or numbness.   ____________________________________________   PHYSICAL EXAM:  VITAL SIGNS: ED Triage Vitals  Enc Vitals Group     BP 12/23/18 1148 (!) 154/69     Pulse Rate 12/23/18 1148 75     Resp 12/23/18 1148 17     Temp 12/23/18 1148 98.2 F (36.8 C)     Temp Source 12/23/18 1148 Oral     SpO2 12/23/18 1148 94 %     Weight 12/23/18 1149 297 lb (134.7 kg)     Height 12/23/18 1149 6\' 2"  (1.88 m)     Head Circumference --      Peak Flow --      Pain Score 12/23/18 1149 0     Pain Loc --      Pain Edu? --      Excl. in [place]? --     Constitutional: Alert and oriented. Well appearing and in no acute distress. Eyes: Conjunctivae are normal.  Head: Atraumatic. Nose: No congestion/rhinnorhea. Mouth/Throat: Mucous membranes are moist.  Neck: No stridor.   Cardiovascular: Normal rate, regular rhythm. Grossly normal heart sounds.  Good peripheral circulation. Respiratory: Patient with labored respirations.  Coarse wheezing throughout all fields. Gastrointestinal: Soft and nontender. No distention. Musculoskeletal: Mild to moderate edema to the bilateral lower extremities.  No erythema or induration. Neurologic:  Normal speech and language. No gross focal neurologic deficits are appreciated. Skin:  Skin is warm, dry and intact. No rash noted. Psychiatric: Mood and affect are normal. Speech and behavior are normal.  ____________________________________________   LABS (all labs ordered are listed, but only abnormal results are displayed)  Labs Reviewed  CBC WITH DIFFERENTIAL/PLATELET - Abnormal; Notable for the following components:      Result Value   WBC 11.9 (*)    Neutro Abs 9.7 (*)    All other components within normal limits  BASIC METABOLIC PANEL - Abnormal; Notable for the following components:   Potassium 2.9 (*)    Glucose, Bld 134 (*)    Calcium 8.6 (*)    All other components within normal limits  BRAIN NATRIURETIC PEPTIDE  TROPONIN I   INFLUENZA PANEL BY PCR (TYPE A & B)   ____________________________________________  EKG  ED ECG REPORT I, Doran Stabler, the attending physician, personally viewed and interpreted this ECG.   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  ST&T Change: T wave inversions in V2 through V4.  No ST segment elevation.  Minimal depression in V2 through V4.  No significant change from previous. ____________________________________________  RADIOLOGY Cardiomegaly with mild pulmonary venous congestion  ____________________________________________   PROCEDURES  Procedure(s) performed:   Procedures  Critical Care performed:    ____________________________________________   INITIAL IMPRESSION / ASSESSMENT AND PLAN / ED COURSE  Pertinent labs & imaging results that were available during my care of the patient were reviewed by me and considered in my medical decision making (see chart for details).  Differential includes, but is not limited to, viral syndrome, bronchitis including COPD exacerbation, pneumonia, reactive airway disease including asthma, CHF including exacerbation with or without pulmonary/interstitial edema, pneumothorax, ACS, thoracic trauma, and pulmonary embolism. As part of my medical decision making, I reviewed the following data within the electronic MEDICAL RECORD NUMBER Notes from prior ED visits  ----------------------------------------- 1:26 PM on 12/23/2018 -----------------------------------------  Patient with still labored respirations despite receiving 2 neb treatments as well as methylprednisolone in route.  Patient will be admitted to the hospital.  He is understanding of the diagnosis well treatment plan and willing to comply.  Signed out to Dr. Earleen Newport.   ____________________________________________   FINAL CLINICAL IMPRESSION(S) /  ED DIAGNOSES  COPD exacerbation.  NEW MEDICATIONS STARTED DURING THIS VISIT:  New Prescriptions   No medications on file     Note:  This document was prepared using Dragon voice recognition software and may include unintentional dictation errors.     Orbie Pyo, MD 12/23/18 [phone]

## 2018-12-24 LAB — BASIC METABOLIC PANEL
Anion gap: 11 (ref 5–15)
BUN: 19 mg/dL (ref 8–23)
CO2: 26 mmol/L (ref 22–32)
Calcium: 8.6 mg/dL — ABNORMAL LOW (ref 8.9–10.3)
Chloride: 103 mmol/L (ref 98–111)
Creatinine, Ser: 0.94 mg/dL (ref 0.61–1.24)
GFR calc Af Amer: >60 mL/min (ref >60)
GFR calc non Af Amer: >60 mL/min (ref >60)
Glucose, Bld: 175 mg/dL — ABNORMAL HIGH (ref 70–99)
POTASSIUM: 3.2 mmol/L — AB (ref 3.5–5.1)
Sodium: 140 mmol/L (ref 135–145)

## 2018-12-24 LAB — CBC
HCT: 40.6 % (ref 39.0–52.0)
Hemoglobin: 13.8 g/dL (ref 13.0–17.0)
MCH: 31.2 pg (ref 26.0–34.0)
MCHC: 34 g/dL (ref 30.0–36.0)
MCV: 91.9 fL (ref 80.0–100.0)
NRBC: 0 % (ref 0.0–0.2)
Platelets: 158 10*3/uL (ref 150–400)
RBC: 4.42 MIL/uL (ref 4.22–5.81)
RDW: 13.5 % (ref 11.5–15.5)
WBC: 12 10*3/uL — ABNORMAL HIGH (ref 4.0–10.5)

## 2018-12-24 MED ORDER — IPRATROPIUM-ALBUTEROL 0.5-2.5 (3) MG/3ML IN SOLN
3.0000 mL | Freq: Three times a day (TID) | RESPIRATORY_TRACT | Status: DC
  Administered 2018-12-25 – 2018-12-27 (×7): 3 mL via RESPIRATORY_TRACT
  Filled 2018-12-24 (×8): qty 3

## 2018-12-24 MED ORDER — POTASSIUM CHLORIDE CRYS ER 20 MEQ PO TBCR
60.0000 meq | EXTENDED_RELEASE_TABLET | Freq: Two times a day (BID) | ORAL | Status: DC
  Administered 2018-12-24 – 2018-12-27 (×7): 60 meq via ORAL
  Filled 2018-12-24 (×7): qty 3

## 2018-12-24 MED ORDER — POTASSIUM CHLORIDE CRYS ER 20 MEQ PO TBCR: 40.0000 meq | EXTENDED_RELEASE_TABLET | ORAL | Status: DC

## 2018-12-24 MED ORDER — GUAIFENESIN-DM 100-10 MG/5ML PO SYRP
5.0000 mL | ORAL_SOLUTION | ORAL | Status: DC | PRN
  Administered 2018-12-24 – 2018-12-27 (×4): 5 mL via ORAL
  Filled 2018-12-24 (×5): qty 5

## 2018-12-24 MED ORDER — MAGNESIUM OXIDE 400 (241.3 MG) MG PO TABS
400.0000 mg | ORAL_TABLET | Freq: Two times a day (BID) | ORAL | Status: DC
  Administered 2018-12-24 – 2018-12-27 (×6): 400 mg via ORAL
  Filled 2018-12-24 (×6): qty 1

## 2018-12-24 MED ORDER — TIOTROPIUM BROMIDE MONOHYDRATE 18 MCG IN CAPS
18.0000 ug | ORAL_CAPSULE | Freq: Every day | RESPIRATORY_TRACT | Status: DC
  Administered 2018-12-24 – 2018-12-26 (×3): 18 ug via RESPIRATORY_TRACT
  Filled 2018-12-24 (×2): qty 5

## 2018-12-24 MED ORDER — MOMETASONE FURO-FORMOTEROL FUM 200-5 MCG/ACT IN AERO: 2.0000 | INHALATION_SPRAY | Freq: Two times a day (BID) | RESPIRATORY_TRACT | Status: DC

## 2018-12-24 NOTE — Plan of Care (Signed)

## 2018-12-24 NOTE — Progress Notes (Signed)
Patient refuses bed alarm, he was frustrated about the alarm being on. Patient is a high fall risk, he fell 3 mos.ago. Educated patient about the importance of his safety, still refuses to place alarm on. RN will continue to monitor.

## 2018-12-24 NOTE — Progress Notes (Signed)
[place] at [facility]                                                                                                                                                                                  Patient Demographics   [name] [name], is a 73 y.o. male, DOB - 1946-04-11, GEZ:[id number]  Admit date - 12/23/2018   Admitting Physician Loletha Grayer, MD  Outpatient Primary MD for the patient is Sparks, Leonie Douglas, MD   LOS - 1  Subjective: Patient admitted with shortness of breath he states that he is feeling better but still short of breath Has put out good amount of urine   Review of Systems:   CONSTITUTIONAL: No documented fever. No fatigue, weakness. No weight gain, no weight loss.  EYES: No blurry or double vision.  ENT: No tinnitus. No postnasal drip. No redness of the oropharynx.  RESPIRATORY: Positive cough, no wheeze, no hemoptysis.  Positive dyspnea.  CARDIOVASCULAR: No chest pain. No orthopnea. No palpitations. No syncope.  GASTROINTESTINAL: No nausea, no vomiting or diarrhea. No abdominal pain. No melena or hematochezia.  GENITOURINARY: No dysuria or hematuria.  ENDOCRINE: No polyuria or nocturia. No heat or cold intolerance.  HEMATOLOGY: No anemia. No bruising. No bleeding.  INTEGUMENTARY: No rashes. No lesions.  MUSCULOSKELETAL: No arthritis. No swelling. No gout.  NEUROLOGIC: No numbness, tingling, or ataxia. No seizure-type activity.  PSYCHIATRIC: No anxiety. No insomnia. No ADD.    Vitals:   Vitals:   12/23/18 2055 12/24/18 0451 12/24/18 0732 12/24/18 0856  BP:  (!) 165/97 (!) 153/89   Pulse:  76 69   Resp:  19 20   Temp:  98.1 F (36.7 C) 97.7 F (36.5 C)   TempSrc:  Oral Oral   SpO2: 95% 94% 94% 94%  Weight:      Height:        Wt Readings from Last 3 Encounters:  12/23/18 135.4 kg  11/29/18 (!) 143.1 kg  10/06/18 133.4 kg     Intake/Output Summary (Last 24 hours) at 12/24/2018 1429 Last data filed at 12/24/2018  0900 Gross per 24 hour  Intake 250 ml  Output 1600 ml  Net -1350 ml    Physical Exam:   GENERAL: Pleasant-appearing in no apparent distress.  HEAD, EYES, EARS, NOSE AND THROAT: Atraumatic, normocephalic. Extraocular muscles are intact. Pupils equal and reactive to light. Sclerae anicteric. No conjunctival injection. No oro-pharyngeal erythema.  NECK: Supple. There is no jugular venous distention. No bruits, no lymphadenopathy, no thyromegaly.  HEART: Regular rate and rhythm,. No murmurs, no rubs, no clicks.  LUNGS: Occasional wheezing throughout both lungs as well as crackles in both lungs  ABDOMEN: Soft, flat, nontender, nondistended. Has good bowel sounds. No hepatosplenomegaly appreciated.  EXTREMITIES: No evidence of any cyanosis, clubbing, or peripheral edema.  +2 pedal and radial pulses bilaterally.  NEUROLOGIC: The patient is alert, awake, and oriented x3 with no focal motor or sensory deficits appreciated bilaterally.  SKIN: Moist and warm with no rashes appreciated.  Psych: Not anxious, depressed LN: No inguinal LN enlargement    Antibiotics   Anti-infectives (From admission, onward)   Start     Dose/Rate Route Frequency Ordered Stop   12/24/18 1000  azithromycin (ZITHROMAX) tablet 250 mg     250 mg Oral Daily 12/23/18 1351     12/23/18 1700  oseltamivir (TAMIFLU) capsule 75 mg     75 mg Oral 2 times daily 12/23/18 1614 12/28/18 2159   12/23/18 1330  azithromycin (ZITHROMAX) 500 mg in sodium chloride 0.9 % 250 mL IVPB     500 mg 250 mL/hr over 60 Minutes Intravenous  Once 12/23/18 1326 12/23/18 1537      Medications   Scheduled Meds: . aspirin EC  81 mg Oral Daily  . azithromycin  250 mg Oral Daily  . budesonide (PULMICORT) nebulizer solution  0.5 mg Nebulization BID  . enoxaparin (LOVENOX) injection  40 mg Subcutaneous Q24H  . FLUoxetine  40 mg Oral Daily  . furosemide  40 mg Intravenous BID  . gabapentin  200 mg Oral TID  . ipratropium-albuterol  3 mL  Nebulization Q6H  . levothyroxine  200 mcg Oral QAC breakfast  . losartan  50 mg Oral Daily  . methylPREDNISolone (SOLU-MEDROL) injection  125 mg Intravenous Once  . methylPREDNISolone (SOLU-MEDROL) injection  40 mg Intravenous Daily  . oseltamivir  75 mg Oral BID  . potassium chloride  60 mEq Oral BID  . simvastatin  40 mg Oral q1800  . verapamil  180 mg Oral BID   Continuous Infusions: PRN Meds:.acetaminophen **OR** acetaminophen, diazepam, ondansetron **OR** ondansetron (ZOFRAN) IV, oxyCODONE-acetaminophen, zolpidem   Data Review:   Micro Results Recent Results (from the past 240 hour(s))  Respiratory Panel by PCR     Status: Abnormal   Collection Time: 12/23/18  2:37 PM  Result Value Ref Range Status   Adenovirus NOT DETECTED NOT DETECTED Final   Coronavirus 229E NOT DETECTED NOT DETECTED Final    Comment: (NOTE) The Coronavirus on the Respiratory Panel, DOES NOT test for the novel  Coronavirus (2019 nCoV)    Coronavirus HKU1 NOT DETECTED NOT DETECTED Final   Coronavirus NL63 NOT DETECTED NOT DETECTED Final   Coronavirus OC43 NOT DETECTED NOT DETECTED Final   Metapneumovirus NOT DETECTED NOT DETECTED Final   Rhinovirus / Enterovirus NOT DETECTED NOT DETECTED Final   Influenza A H1 2009 DETECTED (A) NOT DETECTED Final   Influenza B NOT DETECTED NOT DETECTED Final   Parainfluenza Virus 1 NOT DETECTED NOT DETECTED Final   Parainfluenza Virus 2 NOT DETECTED NOT DETECTED Final   Parainfluenza Virus 3 NOT DETECTED NOT DETECTED Final   Parainfluenza Virus 4 NOT DETECTED NOT DETECTED Final   Respiratory Syncytial Virus NOT DETECTED NOT DETECTED Final   Bordetella pertussis NOT DETECTED NOT DETECTED Final   Chlamydophila pneumoniae NOT DETECTED NOT DETECTED Final   Mycoplasma pneumoniae NOT DETECTED NOT DETECTED Final    Comment: Performed at [facility], [place] [address] [place], [place] [address]    Radiology Reports Dg Chest 2 View  Result Date: 12/23/2018 CLINICAL  DATA:  Shortness of breath. EXAM: CHEST - 2 VIEW  COMPARISON:  11/27/2018. FINDINGS: Mediastinum hilar structures normal. Cardiomegaly with normal pulmonary vascularity. No focal infiltrate. Mild basilar subsegmental atelectasis. No pleural effusion or pneumothorax. Not be excluded. No pleural effusion or pneumothorax. Diffuse osteopenia and degenerative change. Mild thoracic spine compression fractures noted, age undetermined. IMPRESSION: 1.  Cardiomegaly with mild pulmonary venous congestion. 2.  Mild thoracic spine compression fractures, age undetermined. Electronically Signed   By: Marcello Moores  Register   On: 12/23/2018 12:09   Dg Chest Portable 1 View  Result Date: 11/27/2018 CLINICAL DATA:  Dyspnea EXAM: PORTABLE CHEST 1 VIEW COMPARISON:  02/07/2018, 12/11/2011 FINDINGS: Cardiomegaly without overt edema. Chronic hazy and streaky bibasilar opacity. No pleural effusion. No pneumothorax. IMPRESSION: No active disease. Cardiomegaly with chronic streaky and hazy basilar opacities. Electronically Signed   By: Donavan Foil M.D.   On: 11/27/2018 00:52     CBC Recent Labs  Lab 12/23/18 1126 12/24/18 0517  WBC 11.9* 12.0*  HGB 13.7 13.8  HCT 40.4 40.6  PLT 153 158  MCV 91.8 91.9  MCH 31.1 31.2  MCHC 33.9 34.0  RDW 13.7 13.5  LYMPHSABS 0.9  --   MONOABS 1.0  --   EOSABS 0.2  --   BASOSABS 0.0  --     Chemistries  Recent Labs  Lab 12/23/18 1126 12/24/18 0517  NA 139 140  K 2.9* 3.2*  CL 101 103  CO2 27 26  GLUCOSE 134* 175*  BUN 13 19  CREATININE 0.92 0.94  CALCIUM 8.6* 8.6*  MG 1.9  --    ------------------------------------------------------------------------------------------------------------------ estimated creatinine clearance is 100.9 mL/min (by C-G formula based on SCr of 0.94 mg/dL). ------------------------------------------------------------------------------------------------------------------ No results for input(s): HGBA1C in the last 72  hours. ------------------------------------------------------------------------------------------------------------------ No results for input(s): CHOL, HDL, LDLCALC, TRIG, CHOLHDL, LDLDIRECT in the last 72 hours. ------------------------------------------------------------------------------------------------------------------ No results for input(s): TSH, T4TOTAL, T3FREE, THYROIDAB in the last 72 hours.  Invalid input(s): FREET3 ------------------------------------------------------------------------------------------------------------------ No results for input(s): VITAMINB12, FOLATE, FERRITIN, TIBC, IRON, RETICCTPCT in the last 72 hours.  Coagulation profile No results for input(s): INR, PROTIME in the last 168 hours.  No results for input(s): DDIMER in the last 72 hours.  Cardiac Enzymes Recent Labs  Lab 12/23/18 1126  TROPONINI <0.03   ------------------------------------------------------------------------------------------------------------------ Invalid input(s): POCBNP    Assessment & Plan   1.  Acute on chronic diastolic congestive heart failure.  Continue Lasix 40 mg IV twice daily.    Continue losartan 2.  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    Continue IV Solu-Medrol nebulizer therapy 3.  Hypokalemia.    Continue to replace potassium recheck in the morning 4.  Hypothyroidism with history of thyroid cancer on levothyroxine 5.  influenza continue tamiflu 6.  Hyperlipidemia unspecified on Zocor 7.  Neuropathy on gabapentin     Code Status Orders  (From admission, onward)         Start     Ordered   12/23/18 1350  Do not attempt resuscitation (DNR)  Continuous    Question Answer Comment  In the event of cardiac or respiratory ARREST Do not call a "code blue"   In the event of cardiac or respiratory ARREST Do not perform Intubation, CPR, defibrillation or ACLS   In the event of cardiac or respiratory ARREST Use medication by any route, position, wound care, and  other measures to relive pain and suffering. May use oxygen, suction and manual treatment of airway obstruction as needed for comfort.   Comments NUrse may pronounce      12/23/18  1350        Code Status History    Date Active Date Inactive Code Status Order ID Comments User Context   11/28/2018 2229 11/29/2018 1737 DNR [id number]  Vaughan Basta, MD Inpatient   11/27/2018 0218 11/28/2018 2229 Full Code [id number]  Lance Coon, MD Inpatient   02/07/2018 1419 02/10/2018 2126 Full Code [id number]  Epifanio Lesches, MD ED    Advance Directive Documentation     Most Recent Value  Type of Advance Directive  Healthcare Power of Attorney, Living will  Pre-existing out of facility DNR order (yellow form or pink MOST form)  -  "MOST" Form in Place?  -           Consults none  DVT Prophylaxis  Lovenox   Lab Results  Component Value Date   PLT 158 12/24/2018     Time Spent in minutes   35 minutes greater than 50% of time spent in care coordination and counseling patient regarding the condition and plan of care.   Dustin Flock M.D on 12/24/2018 at 2:29 PM  Between 7am to 6pm - Pager - [phone]  After 6pm go to [email or web address] - Proofreader  [employer]   Office  [phone]

## 2018-12-25 LAB — POTASSIUM: Potassium: 4.1 mmol/L (ref 3.5–5.1)

## 2018-12-25 LAB — MAGNESIUM: Magnesium: 2.3 mg/dL (ref 1.7–2.4)

## 2018-12-25 MED ORDER — ALBUTEROL SULFATE (2.5 MG/3ML) 0.083% IN NEBU
2.5000 mg | INHALATION_SOLUTION | Freq: Four times a day (QID) | RESPIRATORY_TRACT | Status: DC | PRN
  Administered 2018-12-25: 2.5 mg via RESPIRATORY_TRACT

## 2018-12-25 MED ORDER — OXYCODONE-ACETAMINOPHEN 5-325 MG PO TABS
1.0000 | ORAL_TABLET | Freq: Four times a day (QID) | ORAL | Status: DC | PRN
  Administered 2018-12-25 – 2018-12-27 (×7): 1 via ORAL
  Filled 2018-12-25 (×7): qty 1

## 2018-12-25 NOTE — Progress Notes (Signed)
[place] at [facility]                                                                                                                                                                                  Patient Demographics   [name] [name], is a 73 y.o. male, DOB - 03-05-1946, XHB:[id number]  Admit date - 12/23/2018   Admitting Physician Loletha Grayer, MD  Outpatient Primary MD for the patient is Idelle Crouch, MD   LOS - 2  Subjective: Patient complains of pain all over No fever or chills   Review of Systems:   CONSTITUTIONAL: No documented fever. No fatigue, weakness. No weight gain, no weight loss.  EYES: No blurry or double vision.  ENT: No tinnitus. No postnasal drip. No redness of the oropharynx.  RESPIRATORY: Positive cough, no wheeze, no hemoptysis.  Positive dyspnea.  CARDIOVASCULAR: No chest pain. No orthopnea. No palpitations. No syncope.  GASTROINTESTINAL: No nausea, no vomiting or diarrhea. No abdominal pain. No melena or hematochezia.  GENITOURINARY: No dysuria or hematuria.  ENDOCRINE: No polyuria or nocturia. No heat or cold intolerance.  HEMATOLOGY: No anemia. No bruising. No bleeding.  INTEGUMENTARY: No rashes. No lesions.  MUSCULOSKELETAL: No arthritis. No swelling. No gout.  NEUROLOGIC: No numbness, tingling, or ataxia. No seizure-type activity.  PSYCHIATRIC: No anxiety. No insomnia. No ADD.    Vitals:   Vitals:   12/24/18 1932 12/25/18 0328 12/25/18 0557 12/25/18 0837  BP: 138/78  (!) 162/88 (!) 158/85  Pulse: 66  (!) 58 61  Resp:    20  Temp: 98.4 F (36.9 C)  98.4 F (36.9 C) 97.8 F (36.6 C)  TempSrc: Oral  Oral Oral  SpO2: 95% 96% 94% 94%  Weight:      Height:        Wt Readings from Last 3 Encounters:  12/23/18 135.4 kg  11/29/18 (!) 143.1 kg  10/06/18 133.4 kg     Intake/Output Summary (Last 24 hours) at 12/25/2018 1440 Last data filed at 12/25/2018 1212 Gross per 24 hour  Intake -  Output 1950 ml  Net  -1950 ml    Physical Exam:   GENERAL: Pleasant-appearing in no apparent distress.  HEAD, EYES, EARS, NOSE AND THROAT: Atraumatic, normocephalic. Extraocular muscles are intact. Pupils equal and reactive to light. Sclerae anicteric. No conjunctival injection. No oro-pharyngeal erythema.  NECK: Supple. There is no jugular venous distention. No bruits, no lymphadenopathy, no thyromegaly.  HEART: Regular rate and rhythm,. No murmurs, no rubs, no clicks.  LUNGS: Occasional wheezing throughout both lungs as well as crackles in both lungs ABDOMEN: Soft, flat, nontender, nondistended. Has good bowel sounds. No hepatosplenomegaly appreciated.  EXTREMITIES: No evidence of any cyanosis, clubbing, or peripheral edema.  +2 pedal and radial pulses bilaterally.  NEUROLOGIC: The patient is alert, awake, and oriented x3 with no focal motor or sensory deficits appreciated bilaterally.  SKIN: Moist and warm with no rashes appreciated.  Psych: Not anxious, depressed LN: No inguinal LN enlargement    Antibiotics   Anti-infectives (From admission, onward)   Start     Dose/Rate Route Frequency Ordered Stop   12/24/18 1000  azithromycin (ZITHROMAX) tablet 250 mg     250 mg Oral Daily 12/23/18 1351     12/23/18 1700  oseltamivir (TAMIFLU) capsule 75 mg     75 mg Oral 2 times daily 12/23/18 1614 12/28/18 2159   12/23/18 1330  azithromycin (ZITHROMAX) 500 mg in sodium chloride 0.9 % 250 mL IVPB     500 mg 250 mL/hr over 60 Minutes Intravenous  Once 12/23/18 1326 12/23/18 1537      Medications   Scheduled Meds: . aspirin EC  81 mg Oral Daily  . azithromycin  250 mg Oral Daily  . budesonide (PULMICORT) nebulizer solution  0.5 mg Nebulization BID  . enoxaparin (LOVENOX) injection  40 mg Subcutaneous Q24H  . FLUoxetine  40 mg Oral Daily  . furosemide  40 mg Intravenous BID  . gabapentin  200 mg Oral TID  . ipratropium-albuterol  3 mL Nebulization TID  . levothyroxine  200 mcg Oral QAC breakfast  .  losartan  50 mg Oral Daily  . magnesium oxide  400 mg Oral BID  . methylPREDNISolone (SOLU-MEDROL) injection  125 mg Intravenous Once  . methylPREDNISolone (SOLU-MEDROL) injection  40 mg Intravenous Daily  . oseltamivir  75 mg Oral BID  . potassium chloride  60 mEq Oral BID  . simvastatin  40 mg Oral q1800  . tiotropium  18 mcg Inhalation Daily  . verapamil  180 mg Oral BID   Continuous Infusions: PRN Meds:.acetaminophen **OR** acetaminophen, albuterol, diazepam, guaiFENesin-dextromethorphan, ondansetron **OR** ondansetron (ZOFRAN) IV, oxyCODONE-acetaminophen, zolpidem   Data Review:   Micro Results Recent Results (from the past 240 hour(s))  Respiratory Panel by PCR     Status: Abnormal   Collection Time: 12/23/18  2:37 PM  Result Value Ref Range Status   Adenovirus NOT DETECTED NOT DETECTED Final   Coronavirus 229E NOT DETECTED NOT DETECTED Final    Comment: (NOTE) The Coronavirus on the Respiratory Panel, DOES NOT test for the novel  Coronavirus (2019 nCoV)    Coronavirus HKU1 NOT DETECTED NOT DETECTED Final   Coronavirus NL63 NOT DETECTED NOT DETECTED Final   Coronavirus OC43 NOT DETECTED NOT DETECTED Final   Metapneumovirus NOT DETECTED NOT DETECTED Final   Rhinovirus / Enterovirus NOT DETECTED NOT DETECTED Final   Influenza A H1 2009 DETECTED (A) NOT DETECTED Final   Influenza B NOT DETECTED NOT DETECTED Final   Parainfluenza Virus 1 NOT DETECTED NOT DETECTED Final   Parainfluenza Virus 2 NOT DETECTED NOT DETECTED Final   Parainfluenza Virus 3 NOT DETECTED NOT DETECTED Final   Parainfluenza Virus 4 NOT DETECTED NOT DETECTED Final   Respiratory Syncytial Virus NOT DETECTED NOT DETECTED Final   Bordetella pertussis NOT DETECTED NOT DETECTED Final   Chlamydophila pneumoniae NOT DETECTED NOT DETECTED Final   Mycoplasma pneumoniae NOT DETECTED NOT DETECTED Final    Comment: Performed at [facility], [place] [address], [place],  [address]    Radiology  Reports Dg Chest 2 View  Result Date: 12/23/2018 CLINICAL DATA:  Shortness of  breath. EXAM: CHEST - 2 VIEW COMPARISON:  11/27/2018. FINDINGS: Mediastinum hilar structures normal. Cardiomegaly with normal pulmonary vascularity. No focal infiltrate. Mild basilar subsegmental atelectasis. No pleural effusion or pneumothorax. Not be excluded. No pleural effusion or pneumothorax. Diffuse osteopenia and degenerative change. Mild thoracic spine compression fractures noted, age undetermined. IMPRESSION: 1.  Cardiomegaly with mild pulmonary venous congestion. 2.  Mild thoracic spine compression fractures, age undetermined. Electronically Signed   By: Marcello Moores  Register   On: 12/23/2018 12:09   Dg Chest Portable 1 View  Result Date: 11/27/2018 CLINICAL DATA:  Dyspnea EXAM: PORTABLE CHEST 1 VIEW COMPARISON:  02/07/2018, 12/11/2011 FINDINGS: Cardiomegaly without overt edema. Chronic hazy and streaky bibasilar opacity. No pleural effusion. No pneumothorax. IMPRESSION: No active disease. Cardiomegaly with chronic streaky and hazy basilar opacities. Electronically Signed   By: Donavan Foil M.D.   On: 11/27/2018 00:52     CBC Recent Labs  Lab 12/23/18 1126 12/24/18 0517  WBC 11.9* 12.0*  HGB 13.7 13.8  HCT 40.4 40.6  PLT 153 158  MCV 91.8 91.9  MCH 31.1 31.2  MCHC 33.9 34.0  RDW 13.7 13.5  LYMPHSABS 0.9  --   MONOABS 1.0  --   EOSABS 0.2  --   BASOSABS 0.0  --     Chemistries  Recent Labs  Lab 12/23/18 1126 12/24/18 0517 12/25/18 0448  NA 139 140  --   K 2.9* 3.2* 4.1  CL 101 103  --   CO2 27 26  --   GLUCOSE 134* 175*  --   BUN 13 19  --   CREATININE 0.92 0.94  --   CALCIUM 8.6* 8.6*  --   MG 1.9  --  2.3   ------------------------------------------------------------------------------------------------------------------ estimated creatinine clearance is 100.9 mL/min (by C-G formula based on SCr of 0.94  mg/dL). ------------------------------------------------------------------------------------------------------------------ No results for input(s): HGBA1C in the last 72 hours. ------------------------------------------------------------------------------------------------------------------ No results for input(s): CHOL, HDL, LDLCALC, TRIG, CHOLHDL, LDLDIRECT in the last 72 hours. ------------------------------------------------------------------------------------------------------------------ No results for input(s): TSH, T4TOTAL, T3FREE, THYROIDAB in the last 72 hours.  Invalid input(s): FREET3 ------------------------------------------------------------------------------------------------------------------ No results for input(s): VITAMINB12, FOLATE, FERRITIN, TIBC, IRON, RETICCTPCT in the last 72 hours.  Coagulation profile No results for input(s): INR, PROTIME in the last 168 hours.  No results for input(s): DDIMER in the last 72 hours.  Cardiac Enzymes Recent Labs  Lab 12/23/18 1126  TROPONINI <0.03   ------------------------------------------------------------------------------------------------------------------ Invalid input(s): POCBNP    Assessment & Plan   1.  Acute on chronic diastolic congestive heart failure.  Continue Lasix 40 mg IV twice daily.    Continue losartan 2.  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    Continue IV Solu-Medrol nebulizer therapy 3.  Hypokalemia.    Continue to replace potassium recheck in the morning 4.  Hypothyroidism with history of thyroid cancer on levothyroxine 5.  influenza continue tamiflu 6.  Hyperlipidemia unspecified on Zocor 7.  Neuropathy on gabapentin     Code Status Orders  (From admission, onward)         Start     Ordered   12/23/18 1350  Do not attempt resuscitation (DNR)  Continuous    Question Answer Comment  In the event of cardiac or respiratory ARREST Do not call a "code blue"   In the event of cardiac or  respiratory ARREST Do not perform Intubation, CPR, defibrillation or ACLS   In the event of cardiac or respiratory ARREST Use medication by any route, position, wound care, and other measures  to relive pain and suffering. May use oxygen, suction and manual treatment of airway obstruction as needed for comfort.   Comments NUrse may pronounce      12/23/18 1350        Code Status History    Date Active Date Inactive Code Status Order ID Comments User Context   11/28/2018 2229 11/29/2018 1737 DNR [id number]  Vaughan Basta, MD Inpatient   11/27/2018 0218 11/28/2018 2229 Full Code [id number]  Lance Coon, MD Inpatient   02/07/2018 1419 02/10/2018 2126 Full Code [id number]  Epifanio Lesches, MD ED    Advance Directive Documentation     Most Recent Value  Type of Advance Directive  Healthcare Power of Attorney, Living will  Pre-existing out of facility DNR order (yellow form or pink MOST form)  -  "MOST" Form in Place?  -           Consults none  DVT Prophylaxis  Lovenox   Lab Results  Component Value Date   PLT 158 12/24/2018     Time Spent in minutes   35 minutes greater than 50% of time spent in care coordination and counseling patient regarding the condition and plan of care.   Dustin Flock M.D on 12/25/2018 at 2:40 PM  Between 7am to 6pm - Pager - [phone]  After 6pm go to [email or web address] - Proofreader  [employer]   Office  [phone]

## 2018-12-26 ENCOUNTER — Inpatient Hospital Stay: Payer: Medicare Other

## 2018-12-26 LAB — BASIC METABOLIC PANEL
Anion gap: 7 (ref 5–15)
BUN: 32 mg/dL — ABNORMAL HIGH (ref 8–23)
CO2: 32 mmol/L (ref 22–32)
Calcium: 8.7 mg/dL — ABNORMAL LOW (ref 8.9–10.3)
Chloride: 99 mmol/L (ref 98–111)
Creatinine, Ser: 1.04 mg/dL (ref 0.61–1.24)
GFR calc non Af Amer: >60 mL/min (ref >60)
Glucose, Bld: 204 mg/dL — ABNORMAL HIGH (ref 70–99)
Potassium: 4.2 mmol/L (ref 3.5–5.1)
Sodium: 138 mmol/L (ref 135–145)

## 2018-12-26 MED ORDER — PREDNISONE 50 MG PO TABS
50.0000 mg | ORAL_TABLET | Freq: Every day | ORAL | Status: DC
  Administered 2018-12-27: 50 mg via ORAL
  Filled 2018-12-26: qty 1

## 2018-12-26 MED ORDER — LOSARTAN POTASSIUM 50 MG PO TABS
100.0000 mg | ORAL_TABLET | Freq: Every day | ORAL | Status: DC
  Administered 2018-12-27: 100 mg via ORAL
  Filled 2018-12-26: qty 2

## 2018-12-26 MED ORDER — LOSARTAN POTASSIUM 50 MG PO TABS
50.0000 mg | ORAL_TABLET | Freq: Once | ORAL | Status: AC
  Administered 2018-12-26: 50 mg via ORAL
  Filled 2018-12-26: qty 1

## 2018-12-26 MED ORDER — HYDRALAZINE HCL 20 MG/ML IJ SOLN
10.0000 mg | Freq: Four times a day (QID) | INTRAMUSCULAR | Status: DC | PRN
  Administered 2018-12-27: 10 mg via INTRAVENOUS
  Filled 2018-12-26: qty 1

## 2018-12-26 NOTE — Progress Notes (Signed)
PT Cancellation Note  Patient Details Name: PRANAV LINCE MRN: [id number] DOB: 12-05-1945   Cancelled Treatment:    Reason Eval/Treat Not Completed: Patient at procedure or test/unavailable.  Order received and chart reviewed.  RT currently in room with pt.  Will re-attempt when time allows.  Roxanne Gates, PT, DPT  Roxanne Gates 12/26/2018, 10:16 AM

## 2018-12-26 NOTE — Progress Notes (Addendum)
Pt reported that he had a fall 3 months ago, but still refused bed alarm. Pt was educated about safety. Will continue to monitor.  Update 0611: Pt BP was at 166/ 99 Talked to Dr. Marcille Blanco and ordered to give losartan 50 mg early instead of 1000 this morning. Will continue to monitor.

## 2018-12-26 NOTE — Progress Notes (Signed)
[place] [facility]                                                                                                                                                                                  Patient Demographics   [name] [name], is a 73 y.o. male, DOB - 1946/10/19, JQB:[id number]  Admit date - 12/23/2018   Admitting Physician Loletha Grayer, MD  Outpatient Primary MD for the patient is Idelle Crouch, MD   LOS - 3  Subjective: Continues to be short of breath and very weak  Review of Systems:   CONSTITUTIONAL: No documented fever. No fatigue, weakness. No weight gain, no weight loss.  EYES: No blurry or double vision.  ENT: No tinnitus. No postnasal drip. No redness of the oropharynx.  RESPIRATORY: Positive cough, no wheeze, no hemoptysis.  Positive dyspnea.  CARDIOVASCULAR: No chest pain. No orthopnea. No palpitations. No syncope.  GASTROINTESTINAL: No nausea, no vomiting or diarrhea. No abdominal pain. No melena or hematochezia.  GENITOURINARY: No dysuria or hematuria.  ENDOCRINE: No polyuria or nocturia. No heat or cold intolerance.  HEMATOLOGY: No anemia. No bruising. No bleeding.  INTEGUMENTARY: No rashes. No lesions.  MUSCULOSKELETAL: No arthritis. No swelling. No gout.  NEUROLOGIC: No numbness, tingling, or ataxia. No seizure-type activity.  PSYCHIATRIC: No anxiety. No insomnia. No ADD.    Vitals:   Vitals:   12/25/18 2024 12/26/18 0451 12/26/18 0626 12/26/18 1250  BP: (!) 166/92 (!) 166/99 (!) 169/93   Pulse: 62 (!) 57 (!) 57   Resp: 17 20    Temp: 97.6 F (36.4 C) 98.8 F (37.1 C)    TempSrc: Oral Oral    SpO2: 97% 95%  95%  Weight:      Height:        Wt Readings from Last 3 Encounters:  12/23/18 135.4 kg  11/29/18 (!) 143.1 kg  10/06/18 133.4 kg     Intake/Output Summary (Last 24 hours) at 12/26/2018 1326 Last data filed at 12/26/2018 0920 Gross per 24 hour  Intake -  Output 2000 ml  Net -2000 ml    Physical  Exam:   GENERAL: Pleasant-appearing in no apparent distress.  HEAD, EYES, EARS, NOSE AND THROAT: Atraumatic, normocephalic. Extraocular muscles are intact. Pupils equal and reactive to light. Sclerae anicteric. No conjunctival injection. No oro-pharyngeal erythema.  NECK: Supple. There is no jugular venous distention. No bruits, no lymphadenopathy, no thyromegaly.  HEART: Regular rate and rhythm,. No murmurs, no rubs, no clicks.  LUNGS: Occasional wheezing throughout both lungs as well as crackles in both lungs ABDOMEN: Soft, flat, nontender, nondistended. Has good bowel sounds. No hepatosplenomegaly appreciated.  EXTREMITIES: No  evidence of any cyanosis, clubbing, or peripheral edema.  +2 pedal and radial pulses bilaterally.  NEUROLOGIC: The patient is alert, awake, and oriented x3 with no focal motor or sensory deficits appreciated bilaterally.  SKIN: Moist and warm with no rashes appreciated.  Psych: Not anxious, depressed LN: No inguinal LN enlargement    Antibiotics   Anti-infectives (From admission, onward)   Start     Dose/Rate Route Frequency Ordered Stop   12/24/18 1000  azithromycin (ZITHROMAX) tablet 250 mg     250 mg Oral Daily 12/23/18 1351     12/23/18 1700  oseltamivir (TAMIFLU) capsule 75 mg     75 mg Oral 2 times daily 12/23/18 1614 12/28/18 2159   12/23/18 1330  azithromycin (ZITHROMAX) 500 mg in sodium chloride 0.9 % 250 mL IVPB     500 mg 250 mL/hr over 60 Minutes Intravenous  Once 12/23/18 1326 12/23/18 1537      Medications   Scheduled Meds: . aspirin EC  81 mg Oral Daily  . azithromycin  250 mg Oral Daily  . budesonide (PULMICORT) nebulizer solution  0.5 mg Nebulization BID  . enoxaparin (LOVENOX) injection  40 mg Subcutaneous Q24H  . FLUoxetine  40 mg Oral Daily  . furosemide  40 mg Intravenous BID  . gabapentin  200 mg Oral TID  . ipratropium-albuterol  3 mL Nebulization TID  . levothyroxine  200 mcg Oral QAC breakfast  . losartan  50 mg Oral Daily   . magnesium oxide  400 mg Oral BID  . oseltamivir  75 mg Oral BID  . potassium chloride  60 mEq Oral BID  . predniSONE  50 mg Oral Q breakfast  . simvastatin  40 mg Oral q1800  . tiotropium  18 mcg Inhalation Daily  . verapamil  180 mg Oral BID   Continuous Infusions: PRN Meds:.acetaminophen **OR** acetaminophen, albuterol, diazepam, guaiFENesin-dextromethorphan, ondansetron **OR** ondansetron (ZOFRAN) IV, oxyCODONE-acetaminophen, zolpidem   Data Review:   Micro Results Recent Results (from the past 240 hour(s))  Respiratory Panel by PCR     Status: Abnormal   Collection Time: 12/23/18  2:37 PM  Result Value Ref Range Status   Adenovirus NOT DETECTED NOT DETECTED Final   Coronavirus 229E NOT DETECTED NOT DETECTED Final    Comment: (NOTE) The Coronavirus on the Respiratory Panel, DOES NOT test for the novel  Coronavirus (2019 nCoV)    Coronavirus HKU1 NOT DETECTED NOT DETECTED Final   Coronavirus NL63 NOT DETECTED NOT DETECTED Final   Coronavirus OC43 NOT DETECTED NOT DETECTED Final   Metapneumovirus NOT DETECTED NOT DETECTED Final   Rhinovirus / Enterovirus NOT DETECTED NOT DETECTED Final   Influenza A H1 2009 DETECTED (A) NOT DETECTED Final   Influenza B NOT DETECTED NOT DETECTED Final   Parainfluenza Virus 1 NOT DETECTED NOT DETECTED Final   Parainfluenza Virus 2 NOT DETECTED NOT DETECTED Final   Parainfluenza Virus 3 NOT DETECTED NOT DETECTED Final   Parainfluenza Virus 4 NOT DETECTED NOT DETECTED Final   Respiratory Syncytial Virus NOT DETECTED NOT DETECTED Final   Bordetella pertussis NOT DETECTED NOT DETECTED Final   Chlamydophila pneumoniae NOT DETECTED NOT DETECTED Final   Mycoplasma pneumoniae NOT DETECTED NOT DETECTED Final    Comment: Performed at [facility], [place] [address] [place], [place] [address]    Radiology Reports Dg Chest 2 View  Result Date: 12/23/2018 CLINICAL DATA:  Shortness of breath. EXAM: CHEST - 2 VIEW COMPARISON:  11/27/2018.  FINDINGS: Mediastinum hilar structures  normal. Cardiomegaly with normal pulmonary vascularity. No focal infiltrate. Mild basilar subsegmental atelectasis. No pleural effusion or pneumothorax. Not be excluded. No pleural effusion or pneumothorax. Diffuse osteopenia and degenerative change. Mild thoracic spine compression fractures noted, age undetermined. IMPRESSION: 1.  Cardiomegaly with mild pulmonary venous congestion. 2.  Mild thoracic spine compression fractures, age undetermined. Electronically Signed   By: Marcello Moores  Register   On: 12/23/2018 12:09   Dg Chest Portable 1 View  Result Date: 11/27/2018 CLINICAL DATA:  Dyspnea EXAM: PORTABLE CHEST 1 VIEW COMPARISON:  02/07/2018, 12/11/2011 FINDINGS: Cardiomegaly without overt edema. Chronic hazy and streaky bibasilar opacity. No pleural effusion. No pneumothorax. IMPRESSION: No active disease. Cardiomegaly with chronic streaky and hazy basilar opacities. Electronically Signed   By: Donavan Foil M.D.   On: 11/27/2018 00:52     CBC Recent Labs  Lab 12/23/18 1126 12/24/18 0517  WBC 11.9* 12.0*  HGB 13.7 13.8  HCT 40.4 40.6  PLT 153 158  MCV 91.8 91.9  MCH 31.1 31.2  MCHC 33.9 34.0  RDW 13.7 13.5  LYMPHSABS 0.9  --   MONOABS 1.0  --   EOSABS 0.2  --   BASOSABS 0.0  --     Chemistries  Recent Labs  Lab 12/23/18 1126 12/24/18 0517 12/25/18 0448 12/26/18 0419  NA 139 140  --  138  K 2.9* 3.2* 4.1 4.2  CL 101 103  --  99  CO2 27 26  --  32  GLUCOSE 134* 175*  --  204*  BUN 13 19  --  32*  CREATININE 0.92 0.94  --  1.04  CALCIUM 8.6* 8.6*  --  8.7*  MG 1.9  --  2.3  --    ------------------------------------------------------------------------------------------------------------------ estimated creatinine clearance is 91.2 mL/min (by C-G formula based on SCr of 1.04 mg/dL). ------------------------------------------------------------------------------------------------------------------ No results for input(s): HGBA1C in the last  72 hours. ------------------------------------------------------------------------------------------------------------------ No results for input(s): CHOL, HDL, LDLCALC, TRIG, CHOLHDL, LDLDIRECT in the last 72 hours. ------------------------------------------------------------------------------------------------------------------ No results for input(s): TSH, T4TOTAL, T3FREE, THYROIDAB in the last 72 hours.  Invalid input(s): FREET3 ------------------------------------------------------------------------------------------------------------------ No results for input(s): VITAMINB12, FOLATE, FERRITIN, TIBC, IRON, RETICCTPCT in the last 72 hours.  Coagulation profile No results for input(s): INR, PROTIME in the last 168 hours.  No results for input(s): DDIMER in the last 72 hours.  Cardiac Enzymes Recent Labs  Lab 12/23/18 1126  TROPONINI <0.03   ------------------------------------------------------------------------------------------------------------------ Invalid input(s): POCBNP    Assessment & Plan   1.  Acute on chronic diastolic congestive heart failure.  Continue Lasix 40 mg IV twice daily.    Continue losartan, recheck chest x-ray 2.  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    Continue  predinosone, continue nebulizers,  3.  Hypokalemia.    Continue to replace potassium recheck in the morning 4.  Hypothyroidism with history of thyroid cancer on levothyroxine 5.  influenza continue tamiflu 6.  Hyperlipidemia unspecified on Zocor 7.  Neuropathy on gabapentin     Code Status Orders  (From admission, onward)         Start     Ordered   12/23/18 1350  Do not attempt resuscitation (DNR)  Continuous    Question Answer Comment  In the event of cardiac or respiratory ARREST Do not call a "code blue"   In the event of cardiac or respiratory ARREST Do not perform Intubation, CPR, defibrillation or ACLS   In the event of cardiac or respiratory ARREST Use medication by any  route, position, wound  care, and other measures to relive pain and suffering. May use oxygen, suction and manual treatment of airway obstruction as needed for comfort.   Comments NUrse may pronounce      12/23/18 1350        Code Status History    Date Active Date Inactive Code Status Order ID Comments User Context   11/28/2018 2229 11/29/2018 1737 DNR [id number]  Vaughan Basta, MD Inpatient   11/27/2018 0218 11/28/2018 2229 Full Code [id number]  Lance Coon, MD Inpatient   02/07/2018 1419 02/10/2018 2126 Full Code [id number]  Epifanio Lesches, MD ED    Advance Directive Documentation     Most Recent Value  Type of Advance Directive  Healthcare Power of Attorney, Living will  Pre-existing out of facility DNR order (yellow form or pink MOST form)  -  "MOST" Form in Place?  -           Consults none  DVT Prophylaxis  Lovenox   Lab Results  Component Value Date   PLT 158 12/24/2018     Time Spent in minutes   35 minutes greater than 50% of time spent in care coordination and counseling patient regarding the condition and plan of care.   Dustin Flock M.D on 12/26/2018 at 1:26 PM  Between 7am to 6pm - Pager - [phone]  After 6pm go to [email or web address] - Proofreader  [employer]   Office  [phone]

## 2018-12-26 NOTE — Evaluation (Signed)
Physical Therapy Evaluation Patient Details Name: [name] [name] MRN: [id number] DOB: 1946/09/24 Today's Date: 12/26/2018   History of Present Illness  Pt is a 73 year old male admitted for COPD exacerbation following c/o SOB and cellulitis in LE's.  Pt also tested positive for flu while here.  PMH includes COPD, thyroid and prostate CA, HLD.  Clinical Impression  Pt is a 73 year old male who lives in a multi-story home alone.  He is a limited household ambulator at baseline.  He reported feeling "weak" at initiation of evaluation but was willing to work with PT. He presented with good overall strength of UE's/LE's.  Pt able to perform bed mobility mod I and stand from bedside without physical assistance.  Pt able to walk 50 ft in room while furniture cruising for support and reporting fatigue and SOB.  Pt able to perform standing functional tasks with unilateral UE support.  Pt O2 sats remained WNL throughout evaluation.  Pt will benefit from skilled PT with focus on balance and tolerance to activity.  He will benefit from [facility] PT following DC as well as a BSC due to issues with incontinence and difficulty in accessing rest room in time.    Follow Up Recommendations Home health PT;Supervision - Intermittent    Equipment Recommendations  3in1 (PT)    Recommendations for Other Services       Precautions / Restrictions Precautions Precautions: Fall Restrictions Weight Bearing Restrictions: No      Mobility  Bed Mobility Overal bed mobility: Modified Independent             General bed mobility comments: increased time with HOB elevated  Transfers Overall transfer level: Modified independent               General transfer comment: Pt able to stand while holding counter or using unilateral UE to rise  Ambulation/Gait Ambulation/Gait assistance: Supervision Gait Distance (Feet): 50 Feet       Gait velocity interpretation: 1.31 - 2.62 ft/sec, indicative of limited community  ambulator General Gait Details: Fair foot clearance and step length, holding to furniture when possible.  Mild lateral deviations which pt was able to correct, observed during independent gait.   PT discussed use of SPC or RW for longer distance walking.  Pt became fatigued, but not overly SOB.  O2 sats: 93%.  Stairs            Wheelchair Mobility    Modified Rankin (Stroke Patients Only)       Balance Overall balance assessment: Needs assistance Sitting-balance support: Feet supported Sitting balance-Leahy Scale: Good     Standing balance support: Single extremity supported Standing balance-Leahy Scale: Fair Standing balance comment: Unilateral UE support needed for opening cabinet door overhead, does not feel comfortable bending to lift object from floor.  Quiet stance: 10 sec without UE support                             Pertinent Vitals/Pain Pain Assessment: No/denies pain    Home Living Family/patient expects to be discharged to:: Private residence Living Arrangements: Alone Available Help at Discharge: Neighbor;Available PRN/intermittently Type of Home: House Home Access: Stairs to enter Entrance Stairs-Rails: Can reach both Entrance Stairs-Number of Steps: 5 Home Layout: Multi-level;Able to live on main level with bedroom/bathroom Home Equipment: Gilford Rile - 2 wheels;Cane - single point      Prior Function Level of Independence: Independent  Comments: Pt stated that he is not able to walk far enough to need a RW due to COPD but that he would need a RW if he did walk longer community distances.     Hand Dominance        Extremity/Trunk Assessment   Upper Extremity Assessment Upper Extremity Assessment: Overall WFL for tasks assessed(Grip, wrist flex/ext, elbow flex/ext: 4+/5 bilaterally)    Lower Extremity Assessment Lower Extremity Assessment: Overall WFL for tasks assessed(Grossly 5/5 bilaterally)    Cervical / Trunk  Assessment Cervical / Trunk Assessment: Normal  Communication   Communication: No difficulties  Cognition Arousal/Alertness: Awake/alert Behavior During Therapy: WFL for tasks assessed/performed Overall Cognitive Status: Within Functional Limits for tasks assessed                                        General Comments      Exercises     Assessment/Plan    PT Assessment Patient needs continued PT services  PT Problem List Decreased strength;Decreased mobility;Decreased balance;Decreased knowledge of use of DME;Decreased activity tolerance       PT Treatment Interventions DME instruction;Functional mobility training;Balance training;Patient/family education;Therapeutic activities;Gait training;Stair training;Therapeutic exercise    PT Goals (Current goals can be found in the Care Plan section)  Acute Rehab PT Goals Patient Stated Goal: To be able to navigate home safely PT Goal Formulation: With patient Time For Goal Achievement: 01/09/19 Potential to Achieve Goals: Good    Frequency Min 2X/week   Barriers to discharge        Co-evaluation               AM-PAC PT "6 Clicks" Mobility  Outcome Measure Help needed turning from your back to your side while in a flat bed without using bedrails?: None Help needed moving from lying on your back to sitting on the side of a flat bed without using bedrails?: None Help needed moving to and from a bed to a chair (including a wheelchair)?: None Help needed standing up from a chair using your arms (e.g., wheelchair or bedside chair)?: None Help needed to walk in hospital room?: A Little Help needed climbing 3-5 steps with a railing? : A Little 6 Click Score: 22    End of Session   Activity Tolerance: Patient limited by fatigue Patient left: in bed;with call bell/phone within reach Nurse Communication: Mobility status(Bed alarm not on when PT left as requested by pt) PT Visit Diagnosis: Unsteadiness on  feet (R26.81);Other abnormalities of gait and mobility (R26.89)    Time: 7564-3329 PT Time Calculation (min) (ACUTE ONLY): 22 min   Charges:   PT Evaluation $PT Eval Low Complexity: 1 Low        Roxanne Gates, PT, DPT   Roxanne Gates 12/26/2018, 2:03 PM

## 2018-12-27 LAB — BASIC METABOLIC PANEL
Anion gap: 8 (ref 5–15)
BUN: 33 mg/dL — ABNORMAL HIGH (ref 8–23)
CO2: 31 mmol/L (ref 22–32)
Calcium: 9.1 mg/dL (ref 8.9–10.3)
Chloride: 97 mmol/L — ABNORMAL LOW (ref 98–111)
Creatinine, Ser: 1 mg/dL (ref 0.61–1.24)
GFR calc Af Amer: >60 mL/min (ref >60)
GFR calc non Af Amer: >60 mL/min (ref >60)
Glucose, Bld: 208 mg/dL — ABNORMAL HIGH (ref 70–99)
Potassium: 4.3 mmol/L (ref 3.5–5.1)
Sodium: 136 mmol/L (ref 135–145)

## 2018-12-27 MED ORDER — AZITHROMYCIN 250 MG PO TABS: ORAL_TABLET | ORAL | 0 refills | Status: AC

## 2018-12-27 MED ORDER — PREDNISONE 10 MG (21) PO TBPK: ORAL_TABLET | ORAL | 0 refills | Status: DC

## 2018-12-27 MED ORDER — GUAIFENESIN-DM 100-10 MG/5ML PO SYRP: 15.0000 mL | ORAL_SOLUTION | ORAL | 0 refills | Status: DC | PRN

## 2018-12-27 MED ORDER — OSELTAMIVIR PHOSPHATE 75 MG PO CAPS: 75.0000 mg | ORAL_CAPSULE | Freq: Two times a day (BID) | ORAL | 0 refills | Status: AC

## 2018-12-27 MED ORDER — OXYCODONE-ACETAMINOPHEN 5-325 MG PO TABS: 1.0000 | ORAL_TABLET | Freq: Four times a day (QID) | ORAL | 0 refills | Status: DC | PRN

## 2018-12-27 NOTE — Progress Notes (Signed)
Patient friend c/o patient being SOB, patient ambulated around nursing station with oxygen saturations remaining >/equal 91% on room air.

## 2018-12-27 NOTE — Care Management Note (Signed)
Case Management Note  Patient Details  Name: [name] [name] MRN: [id number] Date of Birth: Mar 27, 1946  Subjective/Objective:     Patient is from home alone.  Admitted with SOB; flu positive.  Hx of CHF.  Patient states he has a functioning scale at home but has not been weighing prior to [facility] services.  Explained importance of daily weights.  He is open to [facility] for [facility] PT and RN.  He would like to resume [facility] services with [facility].  Notified Joelene Millin of [facility] today.  Current with Dr. Doy Hutching.  No difficulties obtaining medications at [facility].  Uses a CPAP at HS.  Requesting BSC; [facility] with [facility] has delivered to [facility].  No further needs identified at this time by [facility].         Action/Plan:   Expected Discharge Date:  12/27/18               Expected Discharge Plan:     In-House Referral:     Discharge planning Services  CM Consult  Post Acute Care Choice:  Resumption of Svcs/PTA Provider, Home Health Choice offered to:  Patient  DME Arranged:  Bedside commode DME Agency:  [place] Arranged:  RN, PT [facility] Agency:  [facility]  Status of Service:  Completed, signed off  If discussed at [place] of Stay Meetings, dates discussed:    Additional Comments:  Elza Rafter, RN 12/27/2018, 11:39 AM

## 2018-12-27 NOTE — Discharge Summary (Signed)
[place] at [facility], [place] y.o., DOB [birth year], MRN [id number]. Admission date: 12/23/2018 Discharge Date 12/27/2018 Primary MD Idelle Crouch, MD Admitting Physician Loletha Grayer, MD  Admission Diagnosis  COPD exacerbation [facility]) [J44.1]  Discharge Diagnosis   Active Problems: Acute on chronic diastolic CHF Acute on chronic COPD exacerbation Influenza Hypokalemia Hypothyroidism Hyperlipidemia Neuropathy  Hospital Course [name] [name]  is a 73 y.o. male with a known history of COPD presents with shortness of breath.  He states the breathing difficulties have been going on for weeks.  He has been battling a cellulitis of his lower extremities for 2 rounds of antibiotics.  Patient was admitted and was noted to have acute on chronic CHF.  Patient also had acute on chronic COPD exasperation.  Patient also noticed to have influenza patient doing much better breathing improved. We will arrange home health.            Consults  None  Significant Tests:  See full reports for all details     Dg Chest 2 View  Result Date: 12/26/2018 CLINICAL DATA:  Shortness of breath EXAM: CHEST - 2 VIEW COMPARISON:  Three days ago FINDINGS: Chronic cardiomegaly. Large lung volumes and mild interstitial coarsening. Chronic extrapleural fat thickening based on 2018 CT. There is no edema, consolidation, effusion, or pneumothorax. IMPRESSION: No acute finding when compared to prior. Electronically Signed   By: Monte Fantasia M.D.   On: 12/26/2018 13:31   Dg Chest 2 View  Result Date: 12/23/2018 CLINICAL DATA:  Shortness of breath. EXAM: CHEST - 2 VIEW COMPARISON:  11/27/2018. FINDINGS: Mediastinum hilar structures normal. Cardiomegaly with normal pulmonary vascularity. No focal infiltrate. Mild basilar subsegmental atelectasis. No pleural effusion or pneumothorax. Not be excluded. No pleural effusion or pneumothorax. Diffuse osteopenia and degenerative change.  Mild thoracic spine compression fractures noted, age undetermined. IMPRESSION: 1.  Cardiomegaly with mild pulmonary venous congestion. 2.  Mild thoracic spine compression fractures, age undetermined. Electronically Signed   By: Marcello Moores  Register   On: 12/23/2018 12:09       Today   Subjective:   Lowry Ram patient doing much better Objective:   Blood pressure (!) 152/87, pulse (!) 58, temperature 98.6 F (37 C), temperature source Oral, resp. rate 17, height 6\' 2"  (1.88 m), weight 135.4 kg, SpO2 96 %.  .  Intake/Output Summary (Last 24 hours) at 12/27/2018 1249 Last data filed at 12/27/2018 1230 Gross per 24 hour  Intake -  Output 3175 ml  Net -3175 ml    Exam VITAL SIGNS: Blood pressure (!) 152/87, pulse (!) 58, temperature 98.6 F (37 C), temperature source Oral, resp. rate 17, height 6\' 2"  (1.88 m), weight 135.4 kg, SpO2 96 %.  GENERAL:  73 y.o.-year-old patient lying in the bed with no acute distress.  EYES: Pupils equal, round, reactive to light and accommodation. No scleral icterus. Extraocular muscles intact.  HEENT: Head atraumatic, normocephalic. Oropharynx and nasopharynx clear.  NECK:  Supple, no jugular venous distention. No thyroid enlargement, no tenderness.  LUNGS: Normal breath sounds bilaterally, no wheezing, rales,rhonchi or crepitation. No use of accessory muscles of respiration.  CARDIOVASCULAR: S1, S2 normal. No murmurs, rubs, or gallops.  ABDOMEN: Soft, nontender, nondistended. Bowel sounds present. No organomegaly or mass.  EXTREMITIES: No pedal edema, cyanosis, or clubbing.  NEUROLOGIC: Cranial nerves II through XII are intact. Muscle strength 5/5 in all extremities. Sensation intact. Gait not checked.  PSYCHIATRIC: The patient is alert and oriented x  3.  SKIN: No obvious rash, lesion, or ulcer.   Data Review     CBC w Diff:  Lab Results  Component Value Date   WBC 12.0 (H) 12/24/2018   HGB 13.8 12/24/2018   HCT 40.6 12/24/2018   PLT 158  12/24/2018   LYMPHOPCT 8 12/23/2018   BANDSPCT 1 02/12/2018   MONOPCT 8 12/23/2018   EOSPCT 2 12/23/2018   BASOPCT 0 12/23/2018   CMP:  Lab Results  Component Value Date   NA 136 12/27/2018   K 4.3 12/27/2018   CL 97 (L) 12/27/2018   CO2 31 12/27/2018   BUN 33 (H) 12/27/2018   CREATININE 1.00 12/27/2018   PROT 7.1 11/26/2018   ALBUMIN 3.9 11/26/2018   BILITOT 1.0 11/26/2018   ALKPHOS 92 11/26/2018   AST 47 (H) 11/26/2018   ALT 66 (H) 11/26/2018  .  Micro Results Recent Results (from the past 240 hour(s))  Respiratory Panel by PCR     Status: Abnormal   Collection Time: 12/23/18  2:37 PM  Result Value Ref Range Status   Adenovirus NOT DETECTED NOT DETECTED Final   Coronavirus 229E NOT DETECTED NOT DETECTED Final    Comment: (NOTE) The Coronavirus on the Respiratory Panel, DOES NOT test for the novel  Coronavirus (2019 nCoV)    Coronavirus HKU1 NOT DETECTED NOT DETECTED Final   Coronavirus NL63 NOT DETECTED NOT DETECTED Final   Coronavirus OC43 NOT DETECTED NOT DETECTED Final   Metapneumovirus NOT DETECTED NOT DETECTED Final   Rhinovirus / Enterovirus NOT DETECTED NOT DETECTED Final   Influenza A H1 2009 DETECTED (A) NOT DETECTED Final   Influenza B NOT DETECTED NOT DETECTED Final   Parainfluenza Virus 1 NOT DETECTED NOT DETECTED Final   Parainfluenza Virus 2 NOT DETECTED NOT DETECTED Final   Parainfluenza Virus 3 NOT DETECTED NOT DETECTED Final   Parainfluenza Virus 4 NOT DETECTED NOT DETECTED Final   Respiratory Syncytial Virus NOT DETECTED NOT DETECTED Final   Bordetella pertussis NOT DETECTED NOT DETECTED Final   Chlamydophila pneumoniae NOT DETECTED NOT DETECTED Final   Mycoplasma pneumoniae NOT DETECTED NOT DETECTED Final    Comment: Performed at [facility], [place]. [address] [place], [place] [address]        Code Status Orders  (From admission, onward)         Start     Ordered   12/23/18 1350  Do not attempt resuscitation (DNR)  Continuous     Question Answer Comment  In the event of cardiac or respiratory ARREST Do not call a "code blue"   In the event of cardiac or respiratory ARREST Do not perform Intubation, CPR, defibrillation or ACLS   In the event of cardiac or respiratory ARREST Use medication by any route, position, wound care, and other measures to relive pain and suffering. May use oxygen, suction and manual treatment of airway obstruction as needed for comfort.   Comments [place]se may pronounce      12/23/18 1350        Code Status History    Date Active Date Inactive Code Status Order ID Comments User Context   11/28/2018 2229 11/29/2018 1737 DNR [id number]  Vaughan Basta, MD Inpatient   11/27/2018 0218 11/28/2018 2229 Full Code [id number]  Lance Coon, MD Inpatient   02/07/2018 1419 02/10/2018 2126 Full Code [id number]  Epifanio Lesches, MD ED    Advance Directive Documentation     Most Recent Value  Type of Advance Directive  Healthcare Power of Attorney, Living will  Pre-existing out of facility DNR order (yellow form or pink MOST form)  -  "MOST" Form in Place?  -          Follow-up Information    [place] Follow up on 12/31/2018.   Specialty:  Cardiology Why:  at 11:00am Contact information: [address] [place] [phone]       Idelle Crouch, MD On 12/29/2018.   Specialty:  Internal Medicine Why:  Appointment Time: @ 3:00 Contact information: [facility] [place] [address] [phone]           Discharge Medications   Allergies as of 12/27/2018   No Known Allergies     Medication List    STOP taking these medications   hydrochlorothiazide 12.5 MG tablet Commonly known as:  HYDRODIURIL     TAKE these medications   acetaminophen 325 MG tablet Commonly known as:  TYLENOL Take 650 mg by mouth 3 (three) times daily.   aspirin EC 81 MG tablet Take  81 mg by mouth daily.   azithromycin 250 MG tablet Commonly known as:  ZITHROMAX Daily x 2 days Start taking on:  December 28, 2018   budesonide-formoterol 160-4.5 MCG/ACT inhaler Commonly known as:  SYMBICORT Inhale 2 puffs into the lungs 2 (two) times daily.   diazepam 5 MG tablet Commonly known as:  VALIUM Take 5 mg by mouth every 6 (six) hours as needed for muscle spasms.   eszopiclone 3 MG Tabs Generic drug:  Eszopiclone Take 3 mg by mouth at bedtime as needed (sleep).   FLUoxetine 40 MG capsule Commonly known as:  PROZAC Take 40 mg by mouth daily.   furosemide 20 MG tablet Commonly known as:  LASIX Take 1 tablet (20 mg total) by mouth daily.   gabapentin 100 MG capsule Commonly known as:  NEURONTIN Take 200 mg by mouth 3 (three) times daily.   guaiFENesin-dextromethorphan 100-10 MG/5ML syrup Commonly known as:  ROBITUSSIN DM Take 15 mLs by mouth every 4 (four) hours as needed for cough.   HYDROcodone-acetaminophen 10-325 MG tablet Commonly known as:  NORCO Take 1 tablet by mouth every 6 (six) hours as needed for pain.   ipratropium-albuterol 0.5-2.5 (3) MG/3ML Soln Commonly known as:  DUONEB Take 3 mLs by nebulization every 6 (six) hours as needed (Shortness of breath, wheezing.).   levothyroxine 200 MCG tablet Commonly known as:  SYNTHROID, LEVOTHROID Take 200 mcg by mouth daily before breakfast.   losartan 50 MG tablet Commonly known as:  COZAAR Take 50 mg by mouth daily.   magnesium oxide 400 MG tablet Commonly known as:  MAG-OX Take 1 tablet by mouth 2 (two) times daily.   montelukast 10 MG tablet Commonly known as:  SINGULAIR Take 10 mg by mouth Nightly.   oseltamivir 75 MG capsule Commonly known as:  TAMIFLU Take 1 capsule (75 mg total) by mouth 2 (two) times daily for 1 day.   oxyCODONE-acetaminophen 5-325 MG tablet Commonly known as:  PERCOCET/ROXICET Take 1 tablet by mouth every 6 (six) hours as needed for moderate pain or severe  pain. What changed:  when to take this   predniSONE 10 MG (21) Tbpk tablet Commonly known as:  STERAPRED UNI-PAK 21 TAB Start at 60mg  taper by 10mg  until complete   simvastatin 80 MG tablet Commonly known as:  ZOCOR Take 80 mg by mouth daily.  SPIRIVA HANDIHALER 18 MCG inhalation capsule Generic drug:  tiotropium Place 1 capsule into inhaler and inhale daily.   tiZANidine 4 MG tablet Commonly known as:  ZANAFLEX Take 4 mg by mouth 3 (three) times daily.   VENTOLIN HFA 108 (90 Base) MCG/ACT inhaler Generic drug:  albuterol Inhale 2 puffs into the lungs every 6 (six) hours as needed for wheezing or shortness of breath.   verapamil 180 MG CR tablet Commonly known as:  CALAN-SR Take 180 mg by mouth 2 (two) times daily.   zolpidem 10 MG tablet Commonly known as:  AMBIEN Take 10 mg by mouth at bedtime.          Total Time in preparing paper work, data evaluation and todays exam - 58 minutes  Dustin Flock M.D on 12/27/2018 at 12:49 [place]  [phone]

## 2018-12-27 NOTE — Plan of Care (Signed)

## 2018-12-31 ENCOUNTER — Ambulatory Visit: Payer: Medicare Other | Admitting: Family

## 2019-01-11 ENCOUNTER — Ambulatory Visit: Payer: Medicare Other | Admitting: Family

## 2019-01-11 ENCOUNTER — Telehealth: Payer: Self-pay | Admitting: Family

## 2019-01-11 NOTE — Telephone Encounter (Signed)
Patient did not show for his [facility] appointment on 01/11/2019. Will attempt to reschedule.

## 2019-01-11 NOTE — Progress Notes (Deleted)
   Patient ID: [name] [name], male    DOB: [birth year], 73 y.o.   MRN: [id number]  HPI  Mr [name] [name] is a 73 y/o male with a history of   Echo report from 11/27/2018 reviewed and showed an EF of 55-60%.  Admitted 12/23/2018 due to     Discharged after 4 days.    Review of Systems    Physical Exam    Assessment & Plan:  1: Chronic heart failure with preserved ejection fraction- - NYHA class  2:

## 2019-02-03 ENCOUNTER — Encounter: Payer: Self-pay | Admitting: Family

## 2019-02-03 ENCOUNTER — Encounter: Payer: Self-pay | Admitting: Pharmacist

## 2019-02-03 ENCOUNTER — Ambulatory Visit: Payer: Medicare Other | Attending: Family | Admitting: Family

## 2019-02-03 ENCOUNTER — Other Ambulatory Visit: Payer: Self-pay

## 2019-02-03 VITALS — BP 122/65 | HR 87 | Temp 97.6°F | Resp 18 | Ht 74.0 in | Wt 292.5 lb

## 2019-02-03 DIAGNOSIS — E7800 Pure hypercholesterolemia, unspecified: Secondary | ICD-10-CM | POA: Insufficient documentation

## 2019-02-03 DIAGNOSIS — I110 Hypertensive heart disease with heart failure: Secondary | ICD-10-CM | POA: Diagnosis not present

## 2019-02-03 DIAGNOSIS — I10 Essential (primary) hypertension: Secondary | ICD-10-CM

## 2019-02-03 DIAGNOSIS — G4730 Sleep apnea, unspecified: Secondary | ICD-10-CM | POA: Insufficient documentation

## 2019-02-03 DIAGNOSIS — I890 Lymphedema, not elsewhere classified: Secondary | ICD-10-CM | POA: Diagnosis not present

## 2019-02-03 DIAGNOSIS — Z8249 Family history of ischemic heart disease and other diseases of the circulatory system: Secondary | ICD-10-CM | POA: Insufficient documentation

## 2019-02-03 DIAGNOSIS — F419 Anxiety disorder, unspecified: Secondary | ICD-10-CM | POA: Insufficient documentation

## 2019-02-03 DIAGNOSIS — I5032 Chronic diastolic (congestive) heart failure: Secondary | ICD-10-CM | POA: Insufficient documentation

## 2019-02-03 DIAGNOSIS — F329 Major depressive disorder, single episode, unspecified: Secondary | ICD-10-CM | POA: Insufficient documentation

## 2019-02-03 DIAGNOSIS — E785 Hyperlipidemia, unspecified: Secondary | ICD-10-CM | POA: Diagnosis not present

## 2019-02-03 DIAGNOSIS — J449 Chronic obstructive pulmonary disease, unspecified: Secondary | ICD-10-CM | POA: Diagnosis not present

## 2019-02-03 DIAGNOSIS — Z79899 Other long term (current) drug therapy: Secondary | ICD-10-CM | POA: Insufficient documentation

## 2019-02-03 DIAGNOSIS — E039 Hypothyroidism, unspecified: Secondary | ICD-10-CM | POA: Diagnosis not present

## 2019-02-03 DIAGNOSIS — Z7982 Long term (current) use of aspirin: Secondary | ICD-10-CM | POA: Diagnosis not present

## 2019-02-03 DIAGNOSIS — F17210 Nicotine dependence, cigarettes, uncomplicated: Secondary | ICD-10-CM | POA: Insufficient documentation

## 2019-02-03 DIAGNOSIS — R7303 Prediabetes: Secondary | ICD-10-CM | POA: Diagnosis not present

## 2019-02-03 DIAGNOSIS — Z720 Tobacco use: Secondary | ICD-10-CM | POA: Insufficient documentation

## 2019-02-03 DIAGNOSIS — Z7901 Long term (current) use of anticoagulants: Secondary | ICD-10-CM | POA: Diagnosis not present

## 2019-02-03 NOTE — Progress Notes (Signed)
Patient ID: [name] [name], male    DOB: 02/21/1946, 73 y.o.   MRN: [id number]  HPI  Mr Vences is a 73 y/o male with a history of hyperlipidemia, HTN, thyroid disease, sleep apnea, COPD, anxiety, depression, prostate cancer, current tobacco use and chronic heart failure.   Echo report from 11/27/2018 reviewed and showed an EF of 55-60%.  Admitted 12/23/2018 due to COPD/ HF exacerbation along with influenza. Medications adjusted and he was discharged after 4 days with home health.   He presents today for his initial visit with a chief complaint of shortness of breath upon moderate exertion. He describes this as chronic in nature having been present for several years. He has associated fatigue, cough, pedal edema and easy bruising along with this. He denies any difficulty sleeping, dizziness, abdominal distention, palpitations, chest pain or weight gain. Currently has home health and PT. Says that he's had numerous falls over the last year but it's because he gets off-balance and not dizzy.  Past Medical History:  Diagnosis Date  . Anxiety   . Anxiety disorder 05/05/2014   unspecifed  . Cancer [facility])    prostate cancer   . CHF (congestive heart failure) ([place])   . Colonic polyp   . COPD (chronic obstructive pulmonary disease) ([place])   . Depression   . Dyspnea   . Elevated prostate specific antigen (PSA)   . Essential (primary) hypertension 05/05/2014  . GI bleed    due to diverticulitis   . Heart murmur    hx of 1960s  . History of thyroid cancer 05/05/2014  . Hypercholesteremia   . Hyperlipidemia   . Hypertension   . Hypothyroidism 05/05/2014  . Impotence of organic origin   . Male erectile dysfunction 12/29/2012  . Malignant neoplasm of prostate ([place])    10/30/2011 T1c - Identified by needle biopsy . Gleason 7 (3+4) 5 of 12 cores, Bilateral   . Prediabetes 06/22/2017  . Prostate cancer ([place])   . Shortness of breath on exertion 07/11/2015  . Sleep apnea    CPAP , report on chart ,  not used in last 2 weeks   . Thyroid cancer ([place])   . Thyroid disease   . Urinary obstruction    not elsewhere classified   Past Surgical History:  Procedure Laterality Date  . BACK SURGERY     lumbar 1987   . BACK SURGERY     C-6- Plate&Pin  . CATARACT EXTRACTION W/PHACO Right 09/15/2018   Procedure: CATARACT EXTRACTION PHACO AND INTRAOCULAR LENS PLACEMENT ([place]) RIGHT;  Surgeon: Leandrew Koyanagi, MD;  Location: [place];  Service: Ophthalmology;  Laterality: Right;  Requests to be last case  . CATARACT EXTRACTION W/PHACO Left 10/06/2018   Procedure: CATARACT EXTRACTION PHACO AND INTRAOCULAR LENS PLACEMENT ([place])  LEFT;  Surgeon: Leandrew Koyanagi, MD;  Location: [place];  Service: Ophthalmology;  Laterality: Left;  . CERVICAL DISC SURGERY    . COLONOSCOPY     10/06/2011, 06/05/2006, 05/06/2002 adematous polyps: CBF 09/2014; Recall Ltr mailed 02/27/2015 (dw)  . COLONOSCOPY WITH PROPOFOL N/A 07/06/2015   Procedure: COLONOSCOPY WITH PROPOFOL;  Surgeon: Manya Silvas, MD;  Location: [facility] ENDOSCOPY;  Service: Endoscopy;  Laterality: N/A;  . COLONOSCOPY WITH PROPOFOL N/A 09/01/2018   Procedure: COLONOSCOPY WITH PROPOFOL;  Surgeon: Manya Silvas, MD;  Location: [facility] ENDOSCOPY;  Service: Endoscopy;  Laterality: N/A;  . HERNIA REPAIR     umbilical hernia repair   . LAMINECTOMY FOR EXCISION / EVACUATION INTRASPINAL LESION  lumbar  . OTHER SURGICAL HISTORY     right rotator cuff surgery   . OTHER SURGICAL HISTORY     bilateral tubes in ears   . POSTERIOR LAMINECTOMY / DECOMPRESSION CERVICAL SPINE    . PROSTATE BIOPSY     10/22/2011 Volume:55.8 cc's, PSA:4.5, Free PSA:12%  . ROBOT ASSISTED LAPAROSCOPIC RADICAL PROSTATECTOMY  12/15/2011   Procedure: ROBOTIC ASSISTED LAPAROSCOPIC RADICAL PROSTATECTOMY LEVEL 2;  Surgeon: Dutch Gray, MD;  Location: [facility];  Service: Urology;  Laterality: N/A;      . ROTATOR CUFF REPAIR Right   . TONSILLECTOMY    . TOTAL  THYROIDECTOMY     Family History  Problem Relation Age of Onset  . Hypertension Mother   . Anxiety disorder Mother   . Stroke Mother        "mini strokes"  . Coronary artery disease Mother   . Heart attack Mother   . Emphysema Father   . Heart disease Father   . Scoliosis Father   . Coronary artery disease Father   . GU problems Neg Hx   . Kidney disease Neg Hx   . Prostate cancer Neg Hx    Social History   Tobacco Use  . Smoking status: Current Every Day Smoker    Packs/day: 1.00    Years: 35.00    Pack years: 35.00  . Smokeless tobacco: Former Network engineer Use Topics  . Alcohol use: Yes    Alcohol/week: 2.0 standard drinks    Types: 1 Glasses of wine, 1 Shots of liquor per week   No Known Allergies Prior to Admission medications   Medication Sig Start Date End Date Taking? Authorizing Provider  acetaminophen (TYLENOL) 325 MG tablet Take 650 mg by mouth every 4 (four) hours as needed.    Yes [provider]  albuterol (VENTOLIN HFA) 108 (90 Base) MCG/ACT inhaler Inhale 2 puffs into the lungs every 6 (six) hours as needed for wheezing or shortness of breath.   Yes [provider]  aspirin EC 81 MG tablet Take 81 mg by mouth daily.    Yes [provider]  budesonide-formoterol (SYMBICORT) 160-4.5 MCG/ACT inhaler Inhale 2 puffs into the lungs 2 (two) times daily.    Yes [provider]  Cholecalciferol 25 MCG (1000 UT) tablet Take 1,000 Units by mouth daily.   Yes [provider]  diazepam (VALIUM) 5 MG tablet Take 5 mg by mouth every 6 (six) hours as needed for muscle spasms.    Yes [provider]  Eszopiclone (ESZOPICLONE) 3 MG TABS Take 3 mg by mouth at bedtime as needed (sleep).    Yes [provider]  FLUoxetine (PROZAC) 40 MG capsule Take 40 mg by mouth daily.   Yes [provider]  furosemide (LASIX) 20 MG tablet Take 1 tablet (20 mg total) by mouth daily. Patient taking differently: Take 40 mg  by mouth daily. Can take extra PRN 11/30/18  Yes Ojie, Jude, MD  gabapentin (NEURONTIN) 100 MG capsule Take 200 mg by mouth 3 (three) times daily.   Yes [provider]  HYDROcodone-acetaminophen (NORCO/VICODIN) 5-325 MG tablet Take 1 tablet by mouth 3 (three) times daily as needed for moderate pain.   Yes [provider]  ipratropium-albuterol (DUONEB) 0.5-2.5 (3) MG/3ML SOLN Take 3 mLs by nebulization every 6 (six) hours as needed (Shortness of breath, wheezing.). 02/10/18  Yes Sainani, Belia Heman, MD  levothyroxine (SYNTHROID, LEVOTHROID) 200 MCG tablet Take 200 mcg by mouth daily before  breakfast.   Yes [provider]  losartan (COZAAR) 50 MG tablet Take 50 mg by mouth daily. 10/19/18  Yes [provider]  magnesium oxide (MAG-OX) 400 MG tablet Take 1 tablet by mouth 2 (two) times daily. 08/18/18 08/18/19 Yes [provider]  montelukast (SINGULAIR) 10 MG tablet Take 10 mg by mouth Nightly.   Yes [provider]  potassium chloride SA (K-DUR,KLOR-CON) 20 MEQ tablet Take 20 mEq by mouth daily.   Yes [provider]  simvastatin (ZOCOR) 80 MG tablet Take 80 mg by mouth daily.    Yes [provider]  tiotropium (SPIRIVA HANDIHALER) 18 MCG inhalation capsule Place 1 capsule into inhaler and inhale daily. 12/08/18  Yes [provider]  tiZANidine (ZANAFLEX) 4 MG tablet Take 4 mg by mouth 3 (three) times daily as needed for muscle spasms.  08/26/18 08/26/19 Yes [provider]  verapamil (CALAN-SR) 180 MG CR tablet Take 180 mg by mouth 2 (two) times daily.    Yes [provider]  zolpidem (AMBIEN) 10 MG tablet Take 10 mg by mouth at bedtime. 09/28/18  Yes [provider]  oxyCODONE-acetaminophen (PERCOCET/ROXICET) 5-325 MG tablet Take 1 tablet by mouth every 6 (six) hours as needed for moderate pain or severe pain. Patient not taking: Reported on 02/03/2019 12/27/18   Dustin Flock, MD    Review of  Systems  Constitutional: Positive for fatigue. Negative for appetite change.  HENT: Negative for congestion, rhinorrhea and sore throat.   Eyes: Negative.   Respiratory: Positive for cough (non-productive) and shortness of breath (with moderate exertion).   Cardiovascular: Positive for leg swelling. Negative for chest pain and palpitations.  Gastrointestinal: Negative for abdominal distention and abdominal pain.  Endocrine: Negative.   Genitourinary: Negative.   Musculoskeletal: Negative for back pain and neck pain.  Skin: Negative.   Allergic/Immunologic: Negative.   Neurological: Negative for dizziness and light-headedness.       Off balance/ frequent falls  Hematological: Negative for adenopathy. Bruises/bleeds easily.  Psychiatric/Behavioral: Negative for dysphoric mood and sleep disturbance (wearing CPAP nightly). The patient is not nervous/anxious.     Vitals:   02/03/19 1325  BP: 122/65  Pulse: 87  Resp: 18  Temp: 97.6 F (36.4 C)  SpO2: 98%  Weight: 292 lb 8 oz (132.7 kg)  Height: 6\' 2"  (1.88 m)   Wt Readings from Last 3 Encounters:  02/03/19 292 lb 8 oz (132.7 kg)  12/23/18 298 lb 8.1 oz (135.4 kg)  11/29/18 (!) 315 lb 7.7 oz (143.1 kg)   Lab Results  Component Value Date   CREATININE 1.00 12/27/2018   CREATININE 1.04 12/26/2018   CREATININE 0.94 12/24/2018     Physical Exam Vitals signs and nursing note reviewed.  Constitutional:      Appearance: He is well-developed.  HENT:     Head: Normocephalic and atraumatic.  Neck:     Musculoskeletal: Normal range of motion and neck supple.     Vascular: No JVD.  Cardiovascular:     Rate and Rhythm: Normal rate and regular rhythm.  Pulmonary:     Effort: Pulmonary effort is normal. No respiratory distress.     Breath sounds: No wheezing or rales.  Abdominal:     Palpations: Abdomen is soft.     Tenderness: There is no abdominal tenderness.  Musculoskeletal:     Right lower leg: He exhibits no tenderness.  Edema (2+ pitting) present.     Left lower leg: He exhibits no tenderness. Edema (  2+ pitting) present.  Skin:    General: Skin is warm and dry.  Neurological:     General: No focal deficit present.     Mental Status: He is alert and oriented to person, place, and time.  Psychiatric:        Mood and Affect: Mood normal.        Behavior: Behavior normal.    Assessment & Plan:  1: Chronic heart failure with preserved ejection fraction- - NYHA class II - euvolemic today - weighing daily and he was instructed to call for an overnight weight gain of >2 pounds or a weekly weight gain of >5 pounds - not adding salt and tries to follow a low sodium diet. Importance of closely following a 2000mg  sodium diet was explained and written dietary information was given to him about this - saw cardiology ([facility]) 10/26/18 - BNP 12/23/2018 was 89.0 - currently has home health/ PT; may benefit from paramedicine program as well - PharmD reconciled medications with the patient - he says that he's received his flu vaccine for this season  2: HTN- - BP looks good today - saw PCP ([facility]) 01/28/2019 - BMP from 01/28/2019 reviewed and showed sodium 138, potassium 3.1, creatinine 1.3 and GFR 54  3: Tobacco use- - currently smokes 1 ppd of cigarettes - complete cessation discussed for 3 minutes with patient  4: Lymphedema- - stage 2 - limited in his ability to exercise due to frequent falls/ sense of being off-balance - unable to wear compression socks due to worsening shortness of breath when bending over to put them on - does elevate his legs "some" and he was encouraged to elevate them when sitting for long periods of time - consider lymphapress compression boots if edema persists.  Medication list was reviewed.  Return in 6 weeks or sooner for any questions/problems before then.

## 2019-02-03 NOTE — Patient Instructions (Signed)
Continue weighing daily and call for an overnight weight gain of > 2 pounds or a weekly weight gain of >5 pounds. 

## 2019-02-03 NOTE — Progress Notes (Signed)
[place] - PHARMACIST COUNSELING NOTE  ADHERENCE ASSESSMENT  Adherence strategy: pill box   Do you ever forget to take your medication? [x] Yes (1) [] No (0)  Do you ever skip doses due to side effects? [] Yes (1) [x] No (0)  Do you have trouble affording your medicines? [] Yes (1) [x] No (0)  Are you ever unable to pick up your medication due to transportation difficulties? [] Yes (1) [x] No (0)  Do you ever stop taking your medications because you don't believe they are helping? [] Yes (1) [x] No (0)  Total score _1______    Recommendations given to patient about increasing adherence: It is unclear what kind of pill box the patient is using. He says it has 6 slots and is from a "diet plan." It sounds like he may forget doses since he does not have two slots for morning and evening medications.  Guideline-Directed Medical Therapy/Evidence Based Medicine    ACE/ARB/ARNI: losartan 50 mg daily   Beta Blocker: none   Aldosterone Antagonist: none Diuretic: furosemide 40 mg daily + extra PRN    SUBJECTIVE   HPI: Here for initial visit to HF Clinic. He reports shortness of breath with minimal activity. Still has swelling in his legs that he says has improved but is still not to his baseline.  Past Medical History:  Diagnosis Date  . Anxiety   . Anxiety disorder 05/05/2014   unspecifed  . Cancer [facility])    prostate cancer   . CHF (congestive heart failure) ([place])   . Colonic polyp   . COPD (chronic obstructive pulmonary disease) ([place])   . Depression   . Dyspnea   . Elevated prostate specific antigen (PSA)   . Essential (primary) hypertension 05/05/2014  . GI bleed    due to diverticulitis   . Heart murmur    hx of 1960s  . History of thyroid cancer 05/05/2014  . Hypercholesteremia   . Hyperlipidemia   . Hypertension   . Hypothyroidism 05/05/2014  . Impotence of organic origin   . Male erectile dysfunction 12/29/2012  . Malignant  neoplasm of prostate ([place])    10/30/2011 T1c - Identified by needle biopsy . Gleason 7 (3+4) 5 of 12 cores, Bilateral   . Prediabetes 06/22/2017  . Prostate cancer ([place])   . Shortness of breath on exertion 07/11/2015  . Sleep apnea    CPAP , report on chart , not used in last 2 weeks   . Thyroid cancer ([place])   . Thyroid disease   . Urinary obstruction    not elsewhere classified        OBJECTIVE    Vital signs: HR 87, BP 122/65, weight (pounds) 292.8  ECHO: Date 11/27/18, EF 55-60%    BMP Latest Ref Rng & Units 12/27/2018 12/26/2018 12/25/2018  Glucose 70 - 99 mg/dL 208(H) 204(H) -  BUN 8 - 23 mg/dL 33(H) 32(H) -  Creatinine 0.61 - 1.24 mg/dL 1.00 1.04 -  Sodium 135 - 145 mmol/L 136 138 -  Potassium 3.5 - 5.1 mmol/L 4.3 4.2 4.1  Chloride 98 - 111 mmol/L 97(L) 99 -  CO2 22 - 32 mmol/L 31 32 -  Calcium 8.9 - 10.3 mg/dL 9.1 8.7(L) -    ASSESSMENT 73 year old male with HFpEF. Reports SOB with minimal activity. Still has swelling in his legs that he says has improved but is still not to his baseline. He feels like medications are working well for him. His BP today is WNL.  He does not weigh daily.   PLAN Continue current medications. Monitor edema. Patient may be candidate for compression boots. May need increase in Lasix if still having shortness of breath and edema. Patient encouraged to start weighing daily as another way to assess if he is retaining fluid. He was counseled to call for weight gain of more than 2 pounds overnight or 5 pounds in a week.    Time spent: 10 minutes  [place], Pharm.D. 02/03/2019 2:12 PM    Current Outpatient Medications:  .  acetaminophen (TYLENOL) 325 MG tablet, Take 650 mg by mouth every 4 (four) hours as needed. , Disp: , Rfl:  .  albuterol (VENTOLIN HFA) 108 (90 Base) MCG/ACT inhaler, Inhale 2 puffs into the lungs every 6 (six) hours as needed for wheezing or shortness of breath., Disp: , Rfl:  .  aspirin EC 81 MG tablet, Take 81 mg by  mouth daily. , Disp: , Rfl:  .  budesonide-formoterol (SYMBICORT) 160-4.5 MCG/ACT inhaler, Inhale 2 puffs into the lungs 2 (two) times daily. , Disp: , Rfl:  .  Cholecalciferol 25 MCG (1000 UT) tablet, Take 1,000 Units by mouth daily., Disp: , Rfl:  .  diazepam (VALIUM) 5 MG tablet, Take 5 mg by mouth every 6 (six) hours as needed for muscle spasms. , Disp: , Rfl:  .  Eszopiclone (ESZOPICLONE) 3 MG TABS, Take 3 mg by mouth at bedtime as needed (sleep). , Disp: , Rfl:  .  FLUoxetine (PROZAC) 40 MG capsule, Take 40 mg by mouth daily., Disp: , Rfl:  .  furosemide (LASIX) 20 MG tablet, Take 1 tablet (20 mg total) by mouth daily. (Patient taking differently: Take 40 mg by mouth daily. Can take extra PRN), Disp: 30 tablet, Rfl: 0 .  gabapentin (NEURONTIN) 100 MG capsule, Take 200 mg by mouth 3 (three) times daily., Disp: , Rfl:  .  HYDROcodone-acetaminophen (NORCO/VICODIN) 5-325 MG tablet, Take 1 tablet by mouth 3 (three) times daily as needed for moderate pain., Disp: , Rfl:  .  ipratropium-albuterol (DUONEB) 0.5-2.5 (3) MG/3ML SOLN, Take 3 mLs by nebulization every 6 (six) hours as needed (Shortness of breath, wheezing.)., Disp: 360 mL, Rfl:  .  levothyroxine (SYNTHROID, LEVOTHROID) 200 MCG tablet, Take 200 mcg by mouth daily before breakfast., Disp: , Rfl:  .  losartan (COZAAR) 50 MG tablet, Take 50 mg by mouth daily., Disp: , Rfl:  .  magnesium oxide (MAG-OX) 400 MG tablet, Take 1 tablet by mouth 2 (two) times daily., Disp: , Rfl:  .  montelukast (SINGULAIR) 10 MG tablet, Take 10 mg by mouth Nightly., Disp: , Rfl:  .  oxyCODONE-acetaminophen (PERCOCET/ROXICET) 5-325 MG tablet, Take 1 tablet by mouth every 6 (six) hours as needed for moderate pain or severe pain. (Patient not taking: Reported on 02/03/2019), Disp: 30 tablet, Rfl: 0 .  potassium chloride SA (K-DUR,KLOR-CON) 20 MEQ tablet, Take 20 mEq by mouth daily., Disp: , Rfl:  .  simvastatin (ZOCOR) 80 MG tablet, Take 80 mg by mouth daily. , Disp: ,  Rfl:  .  tiotropium (SPIRIVA HANDIHALER) 18 MCG inhalation capsule, Place 1 capsule into inhaler and inhale daily., Disp: , Rfl:  .  tiZANidine (ZANAFLEX) 4 MG tablet, Take 4 mg by mouth 3 (three) times daily as needed for muscle spasms. , Disp: , Rfl:  .  verapamil (CALAN-SR) 180 MG CR tablet, Take 180 mg by mouth 2 (two) times daily. , Disp: , Rfl:  .  zolpidem (AMBIEN) 10 MG  tablet, Take 10 mg by mouth at bedtime., Disp: , Rfl:    COUNSELING POINTS/CLINICAL PEARLS Losartan (Goal: 150 mg once daily)  Warn male patient to avoid pregnancy and to report a pregnancy that occurs during therapy.  Side effects may include dizziness, upper respiratory infection, nasal congestion, and back pain.  Warn patient to avoid use of potassium supplements or potassium-containing salt substitutes unless they consult healthcare provider. Furosemide  Drug causes sun-sensitivity. Advise patient to use sunscreen and avoid tanning beds. Patient should avoid activities requiring coordination until drug effects are realized, as drug may cause dizziness, vertigo, or blurred vision. This drug may cause hyperglycemia, hyperuricemia, constipation, diarrhea, loss of appetite, nausea, vomiting, purpuric disorder, cramps, spasticity, asthenia, headache, paresthesia, or scaling eczema. Instruct patient to report unusual bleeding/bruising or signs/symptoms of hypotension, infection, pancreatitis, or ototoxicity (tinnitus, hearing impairment). Advise patient to report signs/symptoms of a severe skin reactions (flu-like symptoms, spreading red rash, or skin/mucous membrane blistering) or erythema multiforme. Instruct patient to eat high-potassium foods during drug therapy, as directed by healthcare professional.  Patient should not drink alcohol while taking this drug.   DRUGS TO AVOID IN HEART FAILURE  Drug or Class Mechanism  Analgesics . NSAIDs . COX-2 inhibitors . Glucocorticoids  Sodium and water retention,  increased systemic vascular resistance, decreased response to diuretics   Diabetes Medications . Metformin . Thiazolidinediones o Rosiglitazone (Avandia) o Pioglitazone (Actos) . DPP4 Inhibitors o Saxagliptin (Onglyza) o Sitagliptin (Januvia)   Lactic acidosis Possible calcium channel blockade   Unknown  Antiarrhythmics . Class I  o Flecainide o Disopyramide . Class III o Sotalol . Other o Dronedarone  Negative inotrope, proarrhythmic   Proarrhythmic, beta blockade  Negative inotrope  Antihypertensives . Alpha Blockers o Doxazosin . Calcium Channel Blockers o Diltiazem o Verapamil o Nifedipine . Central Alpha Adrenergics o Moxonidine . Peripheral Vasodilators o Minoxidil  Increases renin and aldosterone  Negative inotrope    Possible sympathetic withdrawal  Unknown  Anti-infective . Itraconazole . Amphotericin B  Negative inotrope Unknown  Hematologic . Anagrelide . Cilostazol   Possible inhibition of PD IV Inhibition of PD III causing arrhythmias  Neurologic/Psychiatric . Stimulants . Anti-Seizure Drugs o Carbamazepine o Pregabalin . Antidepressants o Tricyclics o Citalopram . Parkinsons o Bromocriptine o Pergolide o Pramipexole . Antipsychotics o Clozapine . Antimigraine o Ergotamine o Methysergide . Appetite suppressants . Bipolar o Lithium  Peripheral alpha and beta agonist activity  Negative inotrope and chronotrope Calcium channel blockade  Negative inotrope, proarrhythmic Dose-dependent QT prolongation  Excessive serotonin activity/valvular damage Excessive serotonin activity/valvular damage Unknown  IgE mediated hypersensitivy, calcium channel blockade  Excessive serotonin activity/valvular damage Excessive serotonin activity/valvular damage Valvular damage  Direct myofibrillar degeneration, adrenergic stimulation  Antimalarials . Chloroquine . Hydroxychloroquine Intracellular inhibition of lysosomal enzymes   Urologic Agents . Alpha Blockers o Doxazosin o Prazosin o Tamsulosin o Terazosin  Increased renin and aldosterone  Adapted from Page RL, et al. "Drugs That May Cause or Exacerbate Heart Failure: A Scientific Statement from the [facility]." Circulation 2016; 094:B09-G28. DOI: 10.1161/CIR.0000000000000426   MEDICATION ADHERENCES TIPS AND STRATEGIES 1. Taking medication as prescribed improves patient outcomes in heart failure (reduces hospitalizations, improves symptoms, increases survival) 2. Side effects of medications can be managed by decreasing doses, switching agents, stopping drugs, or adding additional therapy. Please let someone in the [facility] know if you have having bothersome side effects so we can modify your regimen. Do not alter your medication regimen without talking to [place].  3. Medication reminders can help patients remember to take drugs on time. If you are missing or forgetting doses you can try linking behaviors, using pill boxes, or an electronic reminder like an alarm on your phone or an app. Some people can also get automated phone calls as medication reminders.

## 2019-02-04 ENCOUNTER — Encounter: Payer: Self-pay | Admitting: Family

## 2019-02-04 DIAGNOSIS — I890 Lymphedema, not elsewhere classified: Secondary | ICD-10-CM | POA: Insufficient documentation

## 2019-02-20 ENCOUNTER — Emergency Department: Payer: Medicare Other

## 2019-02-20 ENCOUNTER — Emergency Department
Admission: EM | Admit: 2019-02-20 | Discharge: 2019-02-20 | Disposition: A | Discharge status: Home / Self Care | Payer: Medicare Other | Attending: Emergency Medicine | Admitting: Emergency Medicine

## 2019-02-20 ENCOUNTER — Other Ambulatory Visit: Payer: Self-pay

## 2019-02-20 DIAGNOSIS — J449 Chronic obstructive pulmonary disease, unspecified: Secondary | ICD-10-CM | POA: Insufficient documentation

## 2019-02-20 DIAGNOSIS — Y92009 Unspecified place in unspecified non-institutional (private) residence as the place of occurrence of the external cause: Secondary | ICD-10-CM

## 2019-02-20 DIAGNOSIS — I5032 Chronic diastolic (congestive) heart failure: Secondary | ICD-10-CM | POA: Diagnosis not present

## 2019-02-20 DIAGNOSIS — F17210 Nicotine dependence, cigarettes, uncomplicated: Secondary | ICD-10-CM | POA: Diagnosis not present

## 2019-02-20 DIAGNOSIS — Z8546 Personal history of malignant neoplasm of prostate: Secondary | ICD-10-CM | POA: Diagnosis not present

## 2019-02-20 DIAGNOSIS — Z79899 Other long term (current) drug therapy: Secondary | ICD-10-CM | POA: Insufficient documentation

## 2019-02-20 DIAGNOSIS — Y929 Unspecified place or not applicable: Secondary | ICD-10-CM | POA: Diagnosis not present

## 2019-02-20 DIAGNOSIS — Y999 Unspecified external cause status: Secondary | ICD-10-CM | POA: Insufficient documentation

## 2019-02-20 DIAGNOSIS — Y9389 Activity, other specified: Secondary | ICD-10-CM | POA: Insufficient documentation

## 2019-02-20 DIAGNOSIS — Z7982 Long term (current) use of aspirin: Secondary | ICD-10-CM | POA: Insufficient documentation

## 2019-02-20 DIAGNOSIS — I110 Hypertensive heart disease with heart failure: Secondary | ICD-10-CM | POA: Insufficient documentation

## 2019-02-20 DIAGNOSIS — S62524A Nondisplaced fracture of distal phalanx of right thumb, initial encounter for closed fracture: Secondary | ICD-10-CM | POA: Diagnosis not present

## 2019-02-20 DIAGNOSIS — S6991XA Unspecified injury of right wrist, hand and finger(s), initial encounter: Secondary | ICD-10-CM | POA: Diagnosis present

## 2019-02-20 DIAGNOSIS — E039 Hypothyroidism, unspecified: Secondary | ICD-10-CM | POA: Diagnosis not present

## 2019-02-20 DIAGNOSIS — W109XXA Fall (on) (from) unspecified stairs and steps, initial encounter: Secondary | ICD-10-CM | POA: Insufficient documentation

## 2019-02-20 DIAGNOSIS — W19XXXA Unspecified fall, initial encounter: Secondary | ICD-10-CM

## 2019-02-20 LAB — CBC WITH DIFFERENTIAL/PLATELET
Abs Immature Granulocytes: 0.06 10*3/uL (ref 0.00–0.07)
Basophils Absolute: 0.1 10*3/uL (ref 0.0–0.1)
Basophils Relative: 0 %
Eosinophils Absolute: 0.3 10*3/uL (ref 0.0–0.5)
Eosinophils Relative: 2 %
HCT: 39.4 % (ref 39.0–52.0)
Hemoglobin: 13.1 g/dL (ref 13.0–17.0)
Immature Granulocytes: 1 %
Lymphocytes Relative: 9 %
Lymphs Abs: 1.1 10*3/uL (ref 0.7–4.0)
MCH: 31.3 pg (ref 26.0–34.0)
MCHC: 33.2 g/dL (ref 30.0–36.0)
MCV: 94 fL (ref 80.0–100.0)
Monocytes Absolute: 1 10*3/uL (ref 0.1–1.0)
Monocytes Relative: 8 %
Neutro Abs: 10.1 10*3/uL — ABNORMAL HIGH (ref 1.7–7.7)
Neutrophils Relative %: 80 %
Platelets: 222 10*3/uL (ref 150–400)
RBC: 4.19 MIL/uL — ABNORMAL LOW (ref 4.22–5.81)
RDW: 13.5 % (ref 11.5–15.5)
WBC: 12.6 10*3/uL — ABNORMAL HIGH (ref 4.0–10.5)
nRBC: 0 % (ref 0.0–0.2)

## 2019-02-20 LAB — BASIC METABOLIC PANEL
Anion gap: 11 (ref 5–15)
BUN: 21 mg/dL (ref 8–23)
CO2: 23 mmol/L (ref 22–32)
Calcium: 8.9 mg/dL (ref 8.9–10.3)
Chloride: 109 mmol/L (ref 98–111)
Creatinine, Ser: 1.18 mg/dL (ref 0.61–1.24)
GFR calc Af Amer: >60 mL/min (ref >60)
GFR calc non Af Amer: >60 mL/min (ref >60)
Glucose, Bld: 172 mg/dL — ABNORMAL HIGH (ref 70–99)
Potassium: 3.5 mmol/L (ref 3.5–5.1)
Sodium: 143 mmol/L (ref 135–145)

## 2019-02-20 MED ORDER — HYDROCODONE-ACETAMINOPHEN 5-325 MG PO TABS
1.0000 | ORAL_TABLET | Freq: Once | ORAL | Status: AC
  Administered 2019-02-20: 1 via ORAL
  Filled 2019-02-20: qty 1

## 2019-02-20 NOTE — ED Triage Notes (Signed)
Pt to ED via ems from home where pt had mechanical fall. Pt denies dizziness prior to fall states he just fell. Per ems pt was weak on scene. Pt denies LOC or hitting head. Pt c/o right thumb pain. Pt was given .5mg  versed by ems for anxiety.

## 2019-02-20 NOTE — ED Provider Notes (Signed)
[facility] Emergency Department Provider Note  ____________________________________________  Time seen: Approximately 8:29 PM  I have reviewed the triage vital signs and the nursing notes.   HISTORY  Chief Complaint Fall and Weakness    HPI [name] [name] is a 73 y.o. male with a history of prostate cancer anxiety CHF COPD hypertension diverticulitis who comes to the ED complaining of right thumb pain after a fall at home.  He states that he was going down 4 steps in his house when he felt himself slipping on a rug so he lowered himself down.  He denies any pain anywhere except in the outside of his right thumb.  Is nonradiating, worse with thumb movement, no alleviating factors.  No head injury or loss of consciousness.  He has chronic neck pain which has been worsening for the past 5 months but is not changed today.  Denies headache vision change paresthesias or motor weakness.  He reports he was recently seen by his primary care doctor who told him that his peripheral edema was the best that is been in months.  Patient does report that he is on Lasix.      Past Medical History:  Diagnosis Date  . Anxiety   . Anxiety disorder 05/05/2014   unspecifed  . Cancer [facility])    prostate cancer   . CHF (congestive heart failure) ([place])   . Colonic polyp   . COPD (chronic obstructive pulmonary disease) ([place])   . Depression   . Dyspnea   . Elevated prostate specific antigen (PSA)   . Essential (primary) hypertension 05/05/2014  . GI bleed    due to diverticulitis   . Heart murmur    hx of 1960s  . History of thyroid cancer 05/05/2014  . Hypercholesteremia   . Hyperlipidemia   . Hypertension   . Hypothyroidism 05/05/2014  . Impotence of organic origin   . Male erectile dysfunction 12/29/2012  . Malignant neoplasm of prostate ([place])    10/30/2011 T1c - Identified by needle biopsy . Gleason 7 (3+4) 5 of 12 cores, Bilateral   . Prediabetes 06/22/2017  .  Prostate cancer ([place])   . Shortness of breath on exertion 07/11/2015  . Sleep apnea    CPAP , report on chart , not used in last 2 weeks   . Thyroid cancer ([place])   . Thyroid disease   . Urinary obstruction    not elsewhere classified     Patient Active Problem List   Diagnosis Date Noted  . Lymphedema 02/04/2019  . Tobacco use 02/03/2019  . CHF (congestive heart failure) ([place]) 12/23/2018  . Acute on chronic diastolic CHF (congestive heart failure) ([place]) 11/27/2018  . Cellulitis 11/27/2018  . Hypothyroidism 11/27/2018  . OSA (obstructive sleep apnea) 11/27/2018  . HTN (hypertension) 11/27/2018  . HLD (hyperlipidemia) 11/27/2018  . Protein-calorie malnutrition ([place]) 03/02/2018  . Neuropathy 03/02/2018  . 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  . Difficulty in voiding 03/01/2018  . Vitamin D deficiency 03/01/2018  . Vitamin B12 deficiency 03/01/2018  . Insomnia 03/01/2018  . COPD (chronic obstructive pulmonary disease) ([place]) 02/11/2018  . Pain due to onychomycosis of nail 02/11/2018  . Onychomycosis 02/11/2018  . Traumatic ecchymosis of foot, right, initial encounter 02/11/2018  . Xerostomia 02/11/2018  . Frequent falls 02/11/2018     Past Surgical History:  Procedure Laterality Date  . BACK SURGERY     lumbar 1987   . BACK SURGERY     C-6- Plate&Pin  . CATARACT  EXTRACTION W/PHACO Right 09/15/2018   Procedure: CATARACT EXTRACTION PHACO AND INTRAOCULAR LENS PLACEMENT ([place]) RIGHT;  Surgeon: Leandrew Koyanagi, MD;  Location: [place];  Service: Ophthalmology;  Laterality: Right;  Requests to be last case  . CATARACT EXTRACTION W/PHACO Left 10/06/2018   Procedure: CATARACT EXTRACTION PHACO AND INTRAOCULAR LENS PLACEMENT ([place])  LEFT;  Surgeon: Leandrew Koyanagi, MD;  Location: [place];  Service: Ophthalmology;  Laterality: Left;  . CERVICAL DISC SURGERY    . COLONOSCOPY     10/06/2011, 06/05/2006, 05/06/2002 adematous polyps: CBF 09/2014; Recall Ltr  mailed 02/27/2015 (dw)  . COLONOSCOPY WITH PROPOFOL N/A 07/06/2015   Procedure: COLONOSCOPY WITH PROPOFOL;  Surgeon: Manya Silvas, MD;  Location: [facility] ENDOSCOPY;  Service: Endoscopy;  Laterality: N/A;  . COLONOSCOPY WITH PROPOFOL N/A 09/01/2018   Procedure: COLONOSCOPY WITH PROPOFOL;  Surgeon: Manya Silvas, MD;  Location: [place] ENDOSCOPY;  Service: Endoscopy;  Laterality: N/A;  . HERNIA REPAIR     umbilical hernia repair   . LAMINECTOMY FOR EXCISION / EVACUATION INTRASPINAL LESION     lumbar  . OTHER SURGICAL HISTORY     right rotator cuff surgery   . OTHER SURGICAL HISTORY     bilateral tubes in ears   . POSTERIOR LAMINECTOMY / DECOMPRESSION CERVICAL SPINE    . PROSTATE BIOPSY     10/22/2011 Volume:55.8 cc's, PSA:4.5, Free PSA:12%  . ROBOT ASSISTED LAPAROSCOPIC RADICAL PROSTATECTOMY  12/15/2011   Procedure: ROBOTIC ASSISTED LAPAROSCOPIC RADICAL PROSTATECTOMY LEVEL 2;  Surgeon: Dutch Gray, MD;  Location: [facility];  Service: Urology;  Laterality: N/A;      . ROTATOR CUFF REPAIR Right   . TONSILLECTOMY    . TOTAL THYROIDECTOMY       Prior to Admission medications   Medication Sig Start Date End Date Taking? Authorizing Provider  acetaminophen (TYLENOL) 325 MG tablet Take 650 mg by mouth every 4 (four) hours as needed.     [provider]  albuterol (VENTOLIN HFA) 108 (90 Base) MCG/ACT inhaler Inhale 2 puffs into the lungs every 6 (six) hours as needed for wheezing or shortness of breath.    [provider]  aspirin EC 81 MG tablet Take 81 mg by mouth daily.     [provider]  budesonide-formoterol (SYMBICORT) 160-4.5 MCG/ACT inhaler Inhale 2 puffs into the lungs 2 (two) times daily.     [provider]  Cholecalciferol 25 MCG (1000 UT) tablet Take 1,000 Units by mouth daily.    [provider]  diazepam (VALIUM) 5 MG tablet Take 5 mg by mouth every 6 (six) hours as needed for muscle spasms.     [provider]  Eszopiclone  (ESZOPICLONE) 3 MG TABS Take 3 mg by mouth at bedtime as needed (sleep).     [provider]  FLUoxetine (PROZAC) 40 MG capsule Take 40 mg by mouth daily.    [provider]  furosemide (LASIX) 20 MG tablet Take 1 tablet (20 mg total) by mouth daily. Patient taking differently: Take 40 mg by mouth daily. Can take extra PRN 11/30/18   Otila Back, MD  gabapentin (NEURONTIN) 100 MG capsule Take 200 mg by mouth 3 (three) times daily.    [provider]  HYDROcodone-acetaminophen (NORCO/VICODIN) 5-325 MG tablet Take 1 tablet by mouth 3 (three) times daily as needed for moderate pain.    [provider]  ipratropium-albuterol (DUONEB) 0.5-2.5 (3) MG/3ML SOLN Take 3 mLs by nebulization every 6 (six) hours as needed (Shortness of  breath, wheezing.). 02/10/18   Henreitta Leber, MD  levothyroxine (SYNTHROID, LEVOTHROID) 200 MCG tablet Take 200 mcg by mouth daily before breakfast.    [provider]  losartan (COZAAR) 50 MG tablet Take 50 mg by mouth daily. 10/19/18   [provider]  magnesium oxide (MAG-OX) 400 MG tablet Take 1 tablet by mouth 2 (two) times daily. 08/18/18 08/18/19  [provider]  montelukast (SINGULAIR) 10 MG tablet Take 10 mg by mouth Nightly.    [provider]  oxyCODONE-acetaminophen (PERCOCET/ROXICET) 5-325 MG tablet Take 1 tablet by mouth every 6 (six) hours as needed for moderate pain or severe pain. Patient not taking: Reported on 02/03/2019 12/27/18   Dustin Flock, MD  potassium chloride SA (K-DUR,KLOR-CON) 20 MEQ tablet Take 20 mEq by mouth daily.    [provider]  simvastatin (ZOCOR) 80 MG tablet Take 80 mg by mouth daily.     [provider]  tiotropium (SPIRIVA HANDIHALER) 18 MCG inhalation capsule Place 1 capsule into inhaler and inhale daily. 12/08/18   [provider]  tiZANidine (ZANAFLEX) 4 MG tablet Take 4 mg by mouth 3 (three) times daily as needed for muscle spasms.   08/26/18 08/26/19  [provider]  verapamil (CALAN-SR) 180 MG CR tablet Take 180 mg by mouth 2 (two) times daily.     [provider]  zolpidem (AMBIEN) 10 MG tablet Take 10 mg by mouth at bedtime. 09/28/18   [provider]     Allergies Patient has no known allergies.   Family History  Problem Relation Age of Onset  . Hypertension Mother   . Anxiety disorder Mother   . Stroke Mother        "mini strokes"  . Coronary artery disease Mother   . Heart attack Mother   . Emphysema Father   . Heart disease Father   . Scoliosis Father   . Coronary artery disease Father   . GU problems Neg Hx   . Kidney disease Neg Hx   . Prostate cancer Neg Hx     Social History Social History   Tobacco Use  . Smoking status: Current Every Day Smoker    Packs/day: 1.00    Years: 35.00    Pack years: 35.00  . Smokeless tobacco: Former Network engineer Use Topics  . Alcohol use: Yes    Alcohol/week: 2.0 standard drinks    Types: 1 Glasses of wine, 1 Shots of liquor per week  . Drug use: No    Review of Systems  Constitutional:   No fever or chills.  No recent illness ENT:   No sore throat. No rhinorrhea. Cardiovascular:   No chest pain or syncope. Respiratory:   No dyspnea or cough. Gastrointestinal:   Negative for abdominal pain, vomiting and diarrhea.  Musculoskeletal:   Right thumb pain as above All other systems reviewed and are negative except as documented above in ROS and HPI.  ____________________________________________   PHYSICAL EXAM:  VITAL SIGNS: ED Triage Vitals  Enc Vitals Group     BP 02/20/19 1909 118/61     Pulse Rate 02/20/19 1909 68     Resp 02/20/19 1909 18     Temp 02/20/19 1909 98.1 F (36.7 C)     Temp Source 02/20/19 1909 Oral     SpO2 02/20/19 1909 95 %     Weight 02/20/19 1906 292 lb 5.3 oz (132.6 kg)     Height 02/20/19 1906 6\' 2"  (1.88 m)  Head Circumference --      Peak Flow --      Pain Score 02/20/19 1905 10      Pain Loc --      Pain Edu? --      Excl. in [place]? --     Vital signs reviewed, nursing assessments reviewed.   Constitutional:   Alert and oriented. Non-toxic appearance. Eyes:   Conjunctivae are normal. EOMI. PERRL. ENT      Head:   Normocephalic and atraumatic.      Nose:   No congestion/rhinnorhea.       Mouth/Throat:   MMM, no pharyngeal erythema. No peritonsillar mass.       Neck:   No meningismus. Full ROM.  No bony point tenderness.  Diffuse apprehension with palpation of the neck. Hematological/Lymphatic/Immunilogical:   No cervical lymphadenopathy. Cardiovascular:   RRR. Symmetric bilateral radial and DP pulses.  No murmurs. Cap refill less than 2 seconds. Respiratory:   Normal respiratory effort without tachypnea/retractions. Breath sounds are clear and equal bilaterally. No wheezes/rales/rhonchi. Gastrointestinal: Truncal obesity.  Soft and nontender. Non distended. There is no CVA tenderness.  No rebound, rigidity, or guarding. Musculoskeletal: Limited range of motion of the right thumb at the interphalangeal joint due to pain.  Point tenderness over the distal phalanx of the right thumb.  Normal range of motion in all other extremities. No joint effusions.  No lower extremity tenderness.  No edema. Neurologic:   Normal speech and language.  Motor grossly intact. No acute focal neurologic deficits are appreciated.  Skin:    Skin is warm, dry and intact. No rash noted.  No petechiae, purpura, or bullae.  No lacerations.  Old bruises and skin tears on the left upper extremity.  ____________________________________________    LABS (pertinent positives/negatives) (all labs ordered are listed, but only abnormal results are displayed) Labs Reviewed  BASIC METABOLIC PANEL  CBC WITH DIFFERENTIAL/PLATELET   ____________________________________________   EKG  Interpreted by me Sinus rhythm rate of 67, left axis, right bundle branch block.  No acute ischemic  changes.  ____________________________________________    RADIOLOGY  Dg Finger Thumb Right  Result Date: 02/20/2019 CLINICAL DATA:  Fall today with right thumb pain. EXAM: RIGHT THUMB 2+V COMPARISON:  None. FINDINGS: Mild degenerate change over the first carpometacarpal, first MCP and first IP joints. Very minimally displaced transverse fracture through the mid to inferior aspect of the first distal phalanx. IMPRESSION: Minimal displaced fracture of the first distal phalanx. Electronically Signed   By: Marin Olp M.D.   On: 02/20/2019 19:48    ____________________________________________   PROCEDURES .Splint Application Date/Time: 11/19/2438 9:00 PM Performed by: Carrie Mew, MD Authorized by: Carrie Mew, MD   Consent:    Consent obtained:  Verbal   Consent given by:  Patient   Risks discussed:  Swelling   Alternatives discussed:  No treatment and referral Pre-procedure details:    Sensation:  Normal Procedure details:    Laterality:  Right   Location:  Finger   Finger:  R thumb   Strapping: no     Splint type:  Finger   Supplies:  Aluminum splint Post-procedure details:    Pain:  Unchanged   Sensation:  Normal   Patient tolerance of procedure:  Tolerated well, no immediate complications    ____________________________________________    CLINICAL IMPRESSION / ASSESSMENT AND PLAN / ED COURSE  Medications ordered in the ED: Medications  HYDROcodone-acetaminophen (NORCO/VICODIN) 5-325 MG per tablet 1 tablet (1 tablet Oral Given  02/20/19 2011)    Pertinent labs & imaging results that were available during my care of the patient were reviewed by me and considered in my medical decision making (see chart for details).  JESSY CYBULSKI was evaluated in Emergency Department on 02/20/2019 for the symptoms described in the history of present illness. He was evaluated in the context of the global COVID-19 pandemic, which necessitated consideration that the patient  might be at risk for infection with the SARS-CoV-2 virus that causes COVID-19. Institutional protocols and algorithms that pertain to the evaluation of patients at risk for COVID-19 are in a state of rapid change based on information released by regulatory bodies including the CDC and federal and state organizations. These policies and algorithms were followed during the patient's care in the ED.   Patient presents with right thumb pain after a mechanical slip and fall at home.  X-ray of the thumb reveals a nondisplaced distal phalanx fracture.  Vital signs are normal.  It is possible that he is over diuresed on Lasix, so I will check electrolytes.  Patient given 500 mL of saline by EMS due to borderline blood pressure on scene, patient feels asymptomatic currently except for the thumb pain.  ----------------------------------------- 9:00 PM on 02/20/2019 ----------------------------------------- Aluminum foam splint applied to thumb, buddy taped to hand.  Electrolytes unremarkable.  Stable for discharge home to follow-up with Ortho and primary care.     ____________________________________________   FINAL CLINICAL IMPRESSION(S) / ED DIAGNOSES    Final diagnoses:  Fall at home, initial encounter  Nondisplaced fracture of distal phalanx of right thumb, initial encounter for closed fracture     ED Discharge Orders    None      Portions of this note were generated with dragon dictation software. Dictation errors may occur despite best attempts at proofreading.   Carrie Mew, MD 02/20/19 2101

## 2019-02-20 NOTE — Discharge Instructions (Addendum)
Your xray shows a fracture in the outer bone of your thumb.  Keep the splint in place to protect the thumb and follow up with orthopedics and your doctor this week.  You should also follow up with your neurosurgeon due to the worsening neck pain.  Your lab tests were okay today.

## 2019-03-02 DIAGNOSIS — R55 Syncope and collapse: Secondary | ICD-10-CM | POA: Insufficient documentation

## 2019-03-16 ENCOUNTER — Telehealth: Payer: Self-pay

## 2019-03-16 NOTE — Telephone Encounter (Signed)
   TELEPHONE CALL NOTE  This patient has been deemed a candidate for follow-up tele-health visit to limit community exposure during the Covid-19 pandemic. I spoke with the patient via phone to discuss instructions. The patient was advised to review the section on consent for treatment as well. The patient will receive a phone call 2-3 days prior to their E-Visit at which time consent will be verbally confirmed. A Virtual Office Visit appointment type has been scheduled for 03/17/2019 with Darylene Price FNP.  Gaylord Shih, CMA 03/16/2019 9:49 AM

## 2019-03-16 NOTE — Telephone Encounter (Signed)
TELEPHONE CALL NOTE  YAVIER SNIDER has been deemed a candidate for a follow-up tele-health visit to limit community exposure during the Covid-19 pandemic. I spoke with the patient via phone to ensure availability of phone/video source, confirm preferred email & phone number, discuss instructions and expectations, and review consent.   I reminded JOVIN FESTER to be prepared with any vital sign and/or heart rhythm information that could potentially be obtained via home monitoring, at the time of his visit.  Finally, I reminded KAYMEN ADRIAN to expect an e-mail containing a link for their video-based visit approximately 15 minutes before his visit, or alternatively, a phone call at the time of his visit if his visit is planned to be a phone encounter.  Did the patient verbally consent to treatment as below? YES  Gaylord Shih, CMA 03/16/2019 9:52 AM  CONSENT FOR TELE-HEALTH VISIT - PLEASE REVIEW  I hereby voluntarily request, consent and authorize [facility] and its employed or contracted physicians, physician assistants, nurse practitioners or other licensed health care professionals (the Practitioner), to provide me with telemedicine health care services (the "Services") as deemed necessary by the treating Practitioner. I acknowledge and consent to receive the Services by the Practitioner via telemedicine. I understand that the telemedicine visit will involve communicating with the Practitioner through telephonic communication technology and the disclosure of certain medical information by electronic transmission. I acknowledge that I have been given the opportunity to request an in-person assessment or other available alternative prior to the telemedicine visit and am voluntarily participating in the telemedicine visit.  I understand that I have the right to withhold or withdraw my consent to the use of telemedicine in the course of my care at any time, without affecting my right  to future care or treatment, and that the Practitioner or I may terminate the telemedicine visit at any time. I understand that I have the right to inspect all information obtained and/or recorded in the course of the telemedicine visit and may receive copies of available information for a reasonable fee.  I understand that some of the potential risks of receiving the Services via telemedicine include:  Marland Kitchen Delay or interruption in medical evaluation due to technological equipment failure or disruption; . Information transmitted may not be sufficient (e.g. poor resolution of images) to allow for appropriate medical decision making by the Practitioner; and/or  . In rare instances, security protocols could fail, causing a breach of personal health information.  Furthermore, I acknowledge that it is my responsibility to provide information about my medical history, conditions and care that is complete and accurate to the best of my ability. I acknowledge that Practitioner's advice, recommendations, and/or decision may be based on factors not within their control, such as incomplete or inaccurate data provided by me or lack of visual representation. I understand that the practice of medicine is not an exact science and that Practitioner makes no warranties or guarantees regarding treatment outcomes. I acknowledge that I will receive a copy of this consent concurrently upon execution via email to the email address I last provided but may also request a printed copy by calling the office of [facility].    I understand that my insurance may be billed for this visit.   I have read or had this consent read to me. . I understand the contents of this consent, which adequately explains the benefits and risks of the Services being provided via telemedicine.  Marland Kitchen  I have been provided ample opportunity to ask questions regarding this consent and the Services and have had my questions answered to my  satisfaction. . I give my informed consent for the services to be provided through the use of telemedicine in my medical care  By participating in this telemedicine visit I agree to the above.

## 2019-03-17 ENCOUNTER — Ambulatory Visit: Payer: Medicare Other | Attending: Family | Admitting: Family

## 2019-03-17 ENCOUNTER — Other Ambulatory Visit: Payer: Self-pay

## 2019-03-17 ENCOUNTER — Encounter: Payer: Self-pay | Admitting: Family

## 2019-03-17 VITALS — BP 137/64 | Wt 284.0 lb

## 2019-03-17 DIAGNOSIS — I10 Essential (primary) hypertension: Secondary | ICD-10-CM

## 2019-03-17 DIAGNOSIS — I5032 Chronic diastolic (congestive) heart failure: Secondary | ICD-10-CM

## 2019-03-17 DIAGNOSIS — Z720 Tobacco use: Secondary | ICD-10-CM

## 2019-03-17 NOTE — Progress Notes (Signed)
Virtual Visit via Telephone Note    Evaluation Performed:  Follow-up visit  This visit type was conducted due to national recommendations for restrictions regarding the COVID-19 Pandemic (e.g. social distancing).  This format is felt to be most appropriate for this patient at this time.  All issues noted in this document were discussed and addressed.  No physical exam was performed (except for noted visual exam findings with Video Visits).  Please refer to the patient's chart (MyChart message for video visits and phone note for telephone visits) for the patient's consent to telehealth for [facility]  Date:  03/17/2019   ID:  [name] [name], DOB Oct 28, 1946, MRN [id number]  Patient Location: [place] [address]   Provider location:   [facility] [address], [place] [address]  PCP:  Idelle Crouch, MD  Cardiologist:  Isaias Cowman, MD Electrophysiologist:  None   Chief Complaint:  Shortness of breath  History of Present Illness:    [name] [name] is a 73 y.o. male who presents via audio/video conferencing for a telehealth visit today.  Patient verified DOB and address.  The patient does not have symptoms concerning for COVID-19 infection (fever, chills, cough, or new SHORTNESS OF BREATH).   Patient reports minimal shortness of breath upon moderate exertion. He describes this as chronic in nature having been present for several months. He has associated cough, fatigue and restless legs along with this. He denies any dizziness, pedal edema, abdominal distention, palpitations, chest pain or weight change. Currently being treated for pneumonia with antibiotics. Continues to smoke ~ 1 ppd of cigarettes.   Prior CV studies:   The following studies were reviewed today:  Echo report from 11/27/2018 reviewed and showed an EF of 55-60%.  Past Medical History:  Diagnosis Date  . Anxiety   . Anxiety disorder 05/05/2014    unspecifed  . Cancer [facility])    prostate cancer   . CHF (congestive heart failure) ([place])   . Colonic polyp   . COPD (chronic obstructive pulmonary disease) ([place])   . Depression   . Dyspnea   . Elevated prostate specific antigen (PSA)   . Essential (primary) hypertension 05/05/2014  . GI bleed    due to diverticulitis   . Heart murmur    hx of 1960s  . History of thyroid cancer 05/05/2014  . Hypercholesteremia   . Hyperlipidemia   . Hypertension   . Hypothyroidism 05/05/2014  . Impotence of organic origin   . Male erectile dysfunction 12/29/2012  . Malignant neoplasm of prostate ([place])    10/30/2011 T1c - Identified by needle biopsy . Gleason 7 (3+4) 5 of 12 cores, Bilateral   . Prediabetes 06/22/2017  . Prostate cancer ([place])   . Shortness of breath on exertion 07/11/2015  . Sleep apnea    CPAP , report on chart , not used in last 2 weeks   . Thyroid cancer ([place])   . Thyroid disease   . Urinary obstruction    not elsewhere classified   Past Surgical History:  Procedure Laterality Date  . BACK SURGERY     lumbar 1987   . BACK SURGERY     C-6- Plate&Pin  . CATARACT EXTRACTION W/PHACO Right 09/15/2018   Procedure: CATARACT EXTRACTION PHACO AND INTRAOCULAR LENS PLACEMENT ([place]) RIGHT;  Surgeon: Leandrew Koyanagi, MD;  Location: [place];  Service: Ophthalmology;  Laterality: Right;  Requests to be last case  . CATARACT  EXTRACTION W/PHACO Left 10/06/2018   Procedure: CATARACT EXTRACTION PHACO AND INTRAOCULAR LENS PLACEMENT ([place])  LEFT;  Surgeon: Leandrew Koyanagi, MD;  Location: [place];  Service: Ophthalmology;  Laterality: Left;  . CERVICAL DISC SURGERY    . COLONOSCOPY     10/06/2011, 06/05/2006, 05/06/2002 adematous polyps: CBF 09/2014; Recall Ltr mailed 02/27/2015 (dw)  . COLONOSCOPY WITH PROPOFOL N/A 07/06/2015   Procedure: COLONOSCOPY WITH PROPOFOL;  Surgeon: Manya Silvas, MD;  Location[facility] ENDOSCOPY;  Service: Endoscopy;  Laterality: N/A;   . COLONOSCOPY WITH PROPOFOL N/A 09/01/2018   Procedure: COLONOSCOPY WITH PROPOFOL;  Surgeon: Manya Silvas, MD;  Location: [facility] ENDOSCOPY;  Service: Endoscopy;  Laterality: N/A;  . HERNIA REPAIR     umbilical hernia repair   . LAMINECTOMY FOR EXCISION / EVACUATION INTRASPINAL LESION     lumbar  . OTHER SURGICAL HISTORY     right rotator cuff surgery   . OTHER SURGICAL HISTORY     bilateral tubes in ears   . POSTERIOR LAMINECTOMY / DECOMPRESSION CERVICAL SPINE    . PROSTATE BIOPSY     10/22/2011 Volume:55.8 cc's, PSA:4.5, Free PSA:12%  . ROBOT ASSISTED LAPAROSCOPIC RADICAL PROSTATECTOMY  12/15/2011   Procedure: ROBOTIC ASSISTED LAPAROSCOPIC RADICAL PROSTATECTOMY LEVEL 2;  Surgeon: Dutch Gray, MD;  Location: [facility];  Service: Urology;  Laterality: N/A;      . ROTATOR CUFF REPAIR Right   . TONSILLECTOMY    . TOTAL THYROIDECTOMY       Current Meds  Medication Sig  . albuterol (VENTOLIN HFA) 108 (90 Base) MCG/ACT inhaler Inhale 2 puffs into the lungs every 6 (six) hours as needed for wheezing or shortness of breath.  Marland Kitchen aspirin EC 81 MG tablet Take 81 mg by mouth daily.   . budesonide-formoterol (SYMBICORT) 160-4.5 MCG/ACT inhaler Inhale 2 puffs into the lungs 2 (two) times daily.   . Cholecalciferol 25 MCG (1000 UT) tablet Take 1,000 Units by mouth daily.  . diazepam (VALIUM) 5 MG tablet Take 5 mg by mouth every 6 (six) hours as needed for muscle spasms.   . eszopiclone (LUNESTA) 1 MG TABS tablet Take 1 mg by mouth at bedtime as needed for sleep. Take immediately before bedtime  . FLUoxetine (PROZAC) 40 MG capsule Take 40 mg by mouth daily.  . furosemide (LASIX) 20 MG tablet Take 1 tablet (20 mg total) by mouth daily. (Patient taking differently: Take 40 mg by mouth 2 (two) times daily. Can take extra PRN)  . gabapentin (NEURONTIN) 100 MG capsule Take 200 mg by mouth 3 (three) times daily.  Marland Kitchen HYDROcodone-acetaminophen (NORCO/VICODIN) 5-325 MG tablet Take 1 tablet by mouth 3 (three)  times daily as needed for moderate pain.  Marland Kitchen ipratropium-albuterol (DUONEB) 0.5-2.5 (3) MG/3ML SOLN Take 3 mLs by nebulization every 6 (six) hours as needed (Shortness of breath, wheezing.).  Marland Kitchen levothyroxine (SYNTHROID, LEVOTHROID) 200 MCG tablet Take 200 mcg by mouth daily before breakfast.  . losartan (COZAAR) 50 MG tablet Take 50 mg by mouth daily.  . montelukast (SINGULAIR) 10 MG tablet Take 10 mg by mouth Nightly.  . potassium chloride SA (K-DUR,KLOR-CON) 20 MEQ tablet Take 20 mEq by mouth 2 (two) times daily.   . simvastatin (ZOCOR) 80 MG tablet Take 80 mg by mouth daily.   Marland Kitchen tiotropium (SPIRIVA HANDIHALER) 18 MCG inhalation capsule Place 1 capsule into inhaler and inhale daily.  . verapamil (CALAN-SR) 180 MG CR tablet Take 180 mg by mouth 2 (two) times daily.  Allergies:   Patient has no known allergies.   Social History   Tobacco Use  . Smoking status: Current Every Day Smoker    Packs/day: 1.00    Years: 35.00    Pack years: 35.00  . Smokeless tobacco: Former Network engineer Use Topics  . Alcohol use: Yes    Alcohol/week: 2.0 standard drinks    Types: 1 Glasses of wine, 1 Shots of liquor per week  . Drug use: No     Family Hx: The patient's family history includes Anxiety disorder in his mother; Coronary artery disease in his father and mother; Emphysema in his father; Heart attack in his mother; Heart disease in his father; Hypertension in his mother; Scoliosis in his father; Stroke in his mother. There is no history of GU problems, Kidney disease, or Prostate cancer.  ROS:   Please see the history of present illness.     All other systems reviewed and are negative.   Labs/Other Tests and Data Reviewed:    Recent Labs: 11/26/2018: ALT 66 12/23/2018: B Natriuretic Peptide 89.0 12/25/2018: Magnesium 2.3 02/20/2019: BUN 21; Creatinine, Ser 1.18; Hemoglobin 13.1; Platelets 222; Potassium 3.5; Sodium 143   Recent Lipid Panel No results found for: CHOL, TRIG, HDL,  CHOLHDL, LDLCALC, LDLDIRECT  Wt Readings from Last 3 Encounters:  03/17/19 284 lb (128.8 kg)  02/20/19 292 lb 5.3 oz (132.6 kg)  02/03/19 292 lb 8 oz (132.7 kg)     Exam:    Vital Signs:  BP 137/64 Comment: self-reported  Wt 284 lb (128.8 kg) Comment: self-reported  BMI 36.46 kg/m    Well nourished, well developed male in no  acute distress.   ASSESSMENT & PLAN:    1. Chronic heart failure with preserved ejection fraction- - NYHA class II - euvolemic per patient's description of symptoms - weighing daily and he was reminded to call for an overnight weight gain of >2 pounds or a weekly weight gain of >5 pounds - not adding salt to his food and is trying to closely follow a low sodium diet - says that he's getting up at night to urinate because he's taking his furosemide at 9am and 9pm. Advised patient that he could take his second dose mid- afternoon instead of late in the day.  - saw cardiology ([place]) 03/02/2019 - BNP 12/23/2018 was 89.0  2: HTN- - self-reported BP looks good today - saw PCP ([place]) 03/11/2019 - BMP from 02/20/2019 reviewed and showed sodium 143, potassium 3.5, creatinine 1.18 and GFR >60  3: Tobacco use-   - continues to smoke 1 ppd of cigarettes - complete cessation discussed for 3 minutes  COVID-19 Education: The signs and symptoms of COVID-19 were discussed with the patient and how to seek care for testing (follow up with PCP or arrange E-visit).  The importance of social distancing was discussed today.  Patient Risk:   After full review of this patients clinical status, I feel that they are at least moderate risk at this time.  Time:   Today, I have spent 16 minutes with the patient with telehealth technology discussing medications, diet and symptoms to report.     Medication Adjustments/Labs and Tests Ordered: Current medicines are reviewed at length with the patient today.  Concerns regarding medicines are outlined above.   Tests Ordered: No  orders of the defined types were placed in this encounter.  Medication Changes: No orders of the defined types were placed in this encounter.   Disposition:  Follow-up  in 2 months or sooner for any questions/problems before then.   Signed, Alisa Graff, FNP  03/17/2019 1:50 PM    [facility]

## 2019-03-17 NOTE — Patient Instructions (Signed)
Continue weighing daily and call for an overnight weight gain of > 2 pounds or a weekly weight gain of >5 pounds. 

## 2019-03-29 ENCOUNTER — Other Ambulatory Visit: Payer: Self-pay | Admitting: Internal Medicine

## 2019-03-29 DIAGNOSIS — J189 Pneumonia, unspecified organism: Secondary | ICD-10-CM

## 2019-04-05 ENCOUNTER — Other Ambulatory Visit: Payer: Self-pay

## 2019-04-05 ENCOUNTER — Ambulatory Visit
Admission: RE | Admit: 2019-04-05 | Discharge: 2019-04-05 | Disposition: A | Discharge status: Home / Self Care | Payer: Medicare Other | Source: Ambulatory Visit | Attending: Internal Medicine | Admitting: Internal Medicine

## 2019-04-05 DIAGNOSIS — J189 Pneumonia, unspecified organism: Secondary | ICD-10-CM | POA: Diagnosis not present

## 2019-05-17 ENCOUNTER — Encounter: Payer: Self-pay | Admitting: Family

## 2019-05-17 ENCOUNTER — Other Ambulatory Visit: Payer: Self-pay

## 2019-05-17 ENCOUNTER — Telehealth: Payer: Self-pay

## 2019-05-17 ENCOUNTER — Ambulatory Visit: Payer: Medicare Other | Attending: Family | Admitting: Family

## 2019-05-17 VITALS — Wt 288.0 lb

## 2019-05-17 DIAGNOSIS — Z720 Tobacco use: Secondary | ICD-10-CM

## 2019-05-17 DIAGNOSIS — I5032 Chronic diastolic (congestive) heart failure: Secondary | ICD-10-CM

## 2019-05-17 DIAGNOSIS — I10 Essential (primary) hypertension: Secondary | ICD-10-CM

## 2019-05-17 DIAGNOSIS — I890 Lymphedema, not elsewhere classified: Secondary | ICD-10-CM

## 2019-05-17 NOTE — Patient Instructions (Signed)
Continue weighing daily and call for an overnight weight gain of > 2 pounds or a weekly weight gain of >5 pounds. 

## 2019-05-17 NOTE — Telephone Encounter (Signed)
TELEPHONE CALL NOTE  [name] [name] has been deemed a candidate for a follow-up tele-health visit to limit community exposure during the Covid-19 pandemic. I spoke with the patient via phone to ensure availability of phone/video source, confirm preferred email & phone number, discuss instructions and expectations, and review consent.   I reminded [name] [name] to be prepared with any vital sign and/or heart rhythm information that could potentially be obtained via home monitoring, at the time of his visit.  Finally, I reminded [name] [name] to expect an e-mail containing a link for their video-based visit approximately 15 minutes before his visit, or alternatively, a phone call at the time of his visit if his visit is planned to be a phone encounter.  Did the patient verbally consent to treatment as below? YES  Gaylord Shih, CMA 05/17/2019 12:14 PM  CONSENT FOR TELE-HEALTH VISIT - PLEASE REVIEW  I hereby voluntarily request, consent and authorize [facility] and its employed or contracted physicians, physician assistants, nurse practitioners or other licensed health care professionals (the Practitioner), to provide me with telemedicine health care services (the "Services") as deemed necessary by the treating Practitioner. I acknowledge and consent to receive the Services by the Practitioner via telemedicine. I understand that the telemedicine visit will involve communicating with the Practitioner through telephonic communication technology and the disclosure of certain medical information by electronic transmission. I acknowledge that I have been given the opportunity to request an in-person assessment or other available alternative prior to the telemedicine visit and am voluntarily participating in the telemedicine visit.  I understand that I have the right to withhold or withdraw my consent to the use of telemedicine in the course of my care at any time, without affecting my right  to future care or treatment, and that the Practitioner or I may terminate the telemedicine visit at any time. I understand that I have the right to inspect all information obtained and/or recorded in the course of the telemedicine visit and may receive copies of available information for a reasonable fee.  I understand that some of the potential risks of receiving the Services via telemedicine include:  Marland Kitchen Delay or interruption in medical evaluation due to technological equipment failure or disruption; . Information transmitted may not be sufficient (e.g. poor resolution of images) to allow for appropriate medical decision making by the Practitioner; and/or  . In rare instances, security protocols could fail, causing a breach of personal health information.  Furthermore, I acknowledge that it is my responsibility to provide information about my medical history, conditions and care that is complete and accurate to the best of my ability. I acknowledge that Practitioner's advice, recommendations, and/or decision may be based on factors not within their control, such as incomplete or inaccurate data provided by me or lack of visual representation. I understand that the practice of medicine is not an exact science and that Practitioner makes no warranties or guarantees regarding treatment outcomes. I acknowledge that I will receive a copy of this consent concurrently upon execution via email to the email address I last provided but may also request a printed copy by calling the office of [facility].    I understand that my insurance may be billed for this visit.   I have read or had this consent read to me. . I understand the contents of this consent, which adequately explains the benefits and risks of the Services being provided via telemedicine.  Marland Kitchen  I have been provided ample opportunity to ask questions regarding this consent and the Services and have had my questions answered to my  satisfaction. . I give my informed consent for the services to be provided through the use of telemedicine in my medical care  By participating in this telemedicine visit I agree to the above.

## 2019-05-17 NOTE — Progress Notes (Signed)
Virtual Visit via Telephone Note   Evaluation Performed:  Follow-up visit  This visit type was conducted due to national recommendations for restrictions regarding the COVID-19 Pandemic (e.g. social distancing).  This format is felt to be most appropriate for this patient at this time.  All issues noted in this document were discussed and addressed.  No physical exam was performed (except for noted visual exam findings with Video Visits).  Please refer to the patient's chart (MyChart message for video visits and phone note for telephone visits) for the patient's consent to telehealth for [name] [name]  Date:  05/17/2019   ID:  [name] [name], DOB 09-18-1946, MRN [id number]  Patient Location:  [place] [address]   Provider location:   [facility] [name] [address],  [address]  PCP:  [name] Crouch, MD  Cardiologist:  [name] Cowman, MD Electrophysiologist:  None   Chief Complaint:  fatigue  History of Present Illness:    [name] [name] is a 73 y.o. male who presents via audio/video conferencing for a telehealth visit today.  Patient verified DOB and address.  The patient does not have symptoms concerning for COVID-19 infection (fever, chills, cough, or new SHORTNESS OF BREATH).   Patient reports moderate fatigue upon minimal exertion. He describes this as chronic in nature having been present for several years. He has associated shortness of breath and swelling in his legs along with this. He denies any dizziness, palpitations, chest pain, cough, difficulty sleeping or weight gain.   Prior CV studies:   The following studies were reviewed today:  Echo report from 11/27/2018 reviewed and showed an EF of 55-60%  Past Medical History:  Diagnosis Date  . Anxiety   . Anxiety disorder 05/05/2014   unspecifed  . Cancer [facility])    prostate cancer   . CHF (congestive heart failure) ([place])   . Colonic polyp   . COPD (chronic  obstructive pulmonary disease) ([place])   . Depression   . Dyspnea   . Elevated prostate specific antigen (PSA)   . Essential (primary) hypertension 05/05/2014  . GI bleed    due to diverticulitis   . Heart murmur    hx of 1960s  . History of thyroid cancer 05/05/2014  . Hypercholesteremia   . Hyperlipidemia   . Hypertension   . Hypothyroidism 05/05/2014  . Impotence of organic origin   . Male erectile dysfunction 12/29/2012  . Malignant neoplasm of prostate ([place])    10/30/2011 T1c - Identified by needle biopsy . Gleason 7 (3+4) 5 of 12 cores, Bilateral   . Prediabetes 06/22/2017  . Prostate cancer ([place])   . Shortness of breath on exertion 07/11/2015  . Sleep apnea    CPAP , report on chart , not used in last 2 weeks   . Thyroid cancer ([place])   . Thyroid disease   . Urinary obstruction    not elsewhere classified   Past Surgical History:  Procedure Laterality Date  . BACK SURGERY     lumbar 1987   . BACK SURGERY     C-6- Plate&Pin  . CATARACT EXTRACTION W/PHACO Right 09/15/2018   Procedure: CATARACT EXTRACTION PHACO AND INTRAOCULAR LENS PLACEMENT ([place]) RIGHT;  Surgeon: [name] Koyanagi, MD;  Location: [place];  Service: Ophthalmology;  Laterality: Right;  Requests to be last case  . CATARACT EXTRACTION W/PHACO Left 10/06/2018   Procedure: CATARACT EXTRACTION PHACO AND INTRAOCULAR LENS PLACEMENT ([facility])  LEFT;  Surgeon: [name] Koyanagi, MD;  Location: [place];  Service: Ophthalmology;  Laterality: Left;  . CERVICAL DISC SURGERY    . COLONOSCOPY     10/06/2011, 06/05/2006, 05/06/2002 adematous polyps: CBF 09/2014; Recall Ltr mailed 02/27/2015 (dw)  . COLONOSCOPY WITH PROPOFOL N/A 07/06/2015   Procedure: COLONOSCOPY WITH PROPOFOL;  Surgeon: [name] Silvas, MD;  Location: [facility] ENDOSCOPY;  Service: Endoscopy;  Laterality: N/A;  . COLONOSCOPY WITH PROPOFOL N/A 09/01/2018   Procedure: COLONOSCOPY WITH PROPOFOL;  Surgeon: [name] Silvas, MD;  Location:  [facility] ENDOSCOPY;  Service: Endoscopy;  Laterality: N/A;  . HERNIA REPAIR     umbilical hernia repair   . LAMINECTOMY FOR EXCISION / EVACUATION INTRASPINAL LESION     lumbar  . OTHER SURGICAL HISTORY     right rotator cuff surgery   . OTHER SURGICAL HISTORY     bilateral tubes in ears   . POSTERIOR LAMINECTOMY / DECOMPRESSION CERVICAL SPINE    . PROSTATE BIOPSY     10/22/2011 Volume:55.8 cc's, PSA:4.5, Free PSA:12%  . ROBOT ASSISTED LAPAROSCOPIC RADICAL PROSTATECTOMY  12/15/2011   Procedure: ROBOTIC ASSISTED LAPAROSCOPIC RADICAL PROSTATECTOMY LEVEL 2;  Surgeon: [name] Gray, MD;  Location: [facility];  Service: Urology;  Laterality: N/A;      . ROTATOR CUFF REPAIR Right   . TONSILLECTOMY    . TOTAL THYROIDECTOMY       Current Meds  Medication Sig  . acetaminophen (TYLENOL) 325 MG tablet Take 650 mg by mouth every 4 (four) hours as needed.   Marland Kitchen albuterol (VENTOLIN HFA) 108 (90 Base) MCG/ACT inhaler Inhale 2 puffs into the lungs every 6 (six) hours as needed for wheezing or shortness of breath.  Marland Kitchen aspirin EC 81 MG tablet Take 81 mg by mouth daily.   . budesonide-formoterol (SYMBICORT) 160-4.5 MCG/ACT inhaler Inhale 2 puffs into the lungs 2 (two) times daily.   . Cholecalciferol 25 MCG (1000 UT) tablet Take 1,000 Units by mouth daily.  . diazepam (VALIUM) 5 MG tablet Take 5 mg by mouth every 6 (six) hours as needed for muscle spasms.   Marland Kitchen FLUoxetine (PROZAC) 40 MG capsule Take 40 mg by mouth daily.  . furosemide (LASIX) 20 MG tablet Take 1 tablet (20 mg total) by mouth daily. (Patient taking differently: Take 40 mg by mouth 2 (two) times daily. Can take extra PRN)  . gabapentin (NEURONTIN) 100 MG capsule Take 200 mg by mouth 3 (three) times daily.  Marland Kitchen HYDROcodone-acetaminophen (NORCO/VICODIN) 5-325 MG tablet Take 1 tablet by mouth 3 (three) times daily as needed for moderate pain.  Marland Kitchen ipratropium-albuterol (DUONEB) 0.5-2.5 (3) MG/3ML SOLN Take 3 mLs by nebulization every 6 (six) hours as needed  (Shortness of breath, wheezing.).  Marland Kitchen levothyroxine (SYNTHROID, LEVOTHROID) 200 MCG tablet Take 200 mcg by mouth daily before breakfast.  . losartan (COZAAR) 50 MG tablet Take 50 mg by mouth daily.  . montelukast (SINGULAIR) 10 MG tablet Take 10 mg by mouth Nightly.  . potassium chloride SA (K-DUR,KLOR-CON) 20 MEQ tablet Take 20 mEq by mouth 2 (two) times daily.   . simvastatin (ZOCOR) 80 MG tablet Take 80 mg by mouth daily.   Marland Kitchen tiotropium (SPIRIVA HANDIHALER) 18 MCG inhalation capsule Place 1 capsule into inhaler and inhale daily.  . verapamil (CALAN-SR) 180 MG CR tablet Take 180 mg by mouth 2 (two) times daily.   Marland Kitchen zolpidem (AMBIEN) 5 MG tablet Take 5 mg by mouth at bedtime as needed for sleep.     Allergies:   Patient  has no known allergies.   Social History   Tobacco Use  . Smoking status: Current Every Day Smoker    Packs/day: 1.00    Years: 35.00    Pack years: 35.00  . Smokeless tobacco: Former Network engineer Use Topics  . Alcohol use: Yes    Alcohol/week: 2.0 standard drinks    Types: 1 Glasses of wine, 1 Shots of liquor per week  . Drug use: No     Family Hx: The patient's family history includes Anxiety disorder in his mother; Coronary artery disease in his father and mother; Emphysema in his father; Heart attack in his mother; Heart disease in his father; Hypertension in his mother; Scoliosis in his father; Stroke in his mother. There is no history of GU problems, Kidney disease, or Prostate cancer.  ROS:   Please see the history of present illness.     All other systems reviewed and are negative.   Labs/Other Tests and Data Reviewed:    Recent Labs: 11/26/2018: ALT 66 12/23/2018: B Natriuretic Peptide 89.0 12/25/2018: Magnesium 2.3 02/20/2019: BUN 21; Creatinine, Ser 1.18; Hemoglobin 13.1; Platelets 222; Potassium 3.5; Sodium 143   Recent Lipid Panel No results found for: CHOL, TRIG, HDL, CHOLHDL, LDLCALC, LDLDIRECT  Wt Readings from Last 3 Encounters:  05/17/19  288 lb (130.6 kg)  03/17/19 284 lb (128.8 kg)  02/20/19 292 lb 5.3 oz (132.6 kg)     Exam:    Vital Signs:  Wt 288 lb (130.6 kg) Comment: self-reported  BMI 36.98 kg/m    Well nourished, well developed male in no  acute distress.   ASSESSMENT & PLAN:    1.  Chronic heart failure with preserved ejection fraction- - NYHA class III - euvolemic per patient's description of symptoms - weighing daily and he was reminded to call for an overnight weight gain of >2 pounds or a weekly weight gain of >5 pounds - not adding salt to his food and is trying to closely follow a low sodium diet - saw cardiology ([place]) 03/29/2019 - BNP 12/23/2018 was 89.0  2: HTN- - saw PCP ([place]) 03/25/2019 - BMP from 03/25/2019 reviewed and showed sodium 138, potassium 4.4, creatinine 1.2 and GFR 59  3: Tobacco use-   - continues to smoke 1 ppd of cigarettes - complete cessation discussed for 3 minutes  4: Lymphedema- - stage 2 - elevating legs but edema persists - unable to put support socks on as they are too tight on his legs - limited in his ability to exercise because of his fatigue - will make a referral for lymphapress compression boots  COVID-19 Education: The signs and symptoms of COVID-19 were discussed with the patient and how to seek care for testing (follow up with PCP or arrange E-visit).  The importance of social distancing was discussed today.  Patient Risk:   After full review of this patients clinical status, I feel that they are at least moderate risk at this time.  Time:   Today, I have spent 15 minutes with the patient with telehealth technology discussing medications, weight and symptoms to report.     Medication Adjustments/Labs and Tests Ordered: Current medicines are reviewed at length with the patient today.  Concerns regarding medicines are outlined above.   Tests Ordered: No orders of the defined types were placed in this encounter.  Medication Changes: No orders  of the defined types were placed in this encounter.   Disposition:  Follow-up in 4 months or sooner for  any questions/problems before then.   Signed, Alisa Graff, FNP  05/17/2019 1:45 PM    [place] [facility] [name]

## 2019-05-19 ENCOUNTER — Other Ambulatory Visit: Payer: Self-pay | Admitting: Internal Medicine

## 2019-05-19 DIAGNOSIS — G8929 Other chronic pain: Secondary | ICD-10-CM

## 2019-06-02 ENCOUNTER — Ambulatory Visit: Admission: RE | Admit: 2019-06-02 | Payer: Medicare Other | Source: Ambulatory Visit

## 2019-06-09 NOTE — Progress Notes (Signed)
Patient says that he's tried wearing compression socks in the past but was unable to wear them because they were too tight and would cut into his legs.

## 2019-06-27 ENCOUNTER — Other Ambulatory Visit: Payer: Self-pay

## 2019-06-27 ENCOUNTER — Ambulatory Visit
Admission: RE | Admit: 2019-06-27 | Discharge: 2019-06-27 | Disposition: A | Discharge status: Home / Self Care | Payer: Medicare Other | Source: Ambulatory Visit | Attending: Internal Medicine | Admitting: Internal Medicine

## 2019-06-27 DIAGNOSIS — G8929 Other chronic pain: Secondary | ICD-10-CM | POA: Diagnosis present

## 2019-06-27 DIAGNOSIS — M5442 Lumbago with sciatica, left side: Secondary | ICD-10-CM | POA: Diagnosis not present

## 2019-09-05 ENCOUNTER — Emergency Department: Payer: Medicare Other

## 2019-09-05 ENCOUNTER — Other Ambulatory Visit: Payer: Self-pay

## 2019-09-05 ENCOUNTER — Emergency Department
Admission: EM | Admit: 2019-09-05 | Discharge: 2019-09-05 | Disposition: A | Discharge status: Home / Self Care | Payer: Medicare Other | Attending: Student | Admitting: Student

## 2019-09-05 ENCOUNTER — Encounter: Payer: Self-pay | Admitting: Emergency Medicine

## 2019-09-05 DIAGNOSIS — I5033 Acute on chronic diastolic (congestive) heart failure: Secondary | ICD-10-CM | POA: Insufficient documentation

## 2019-09-05 DIAGNOSIS — Y999 Unspecified external cause status: Secondary | ICD-10-CM | POA: Diagnosis not present

## 2019-09-05 DIAGNOSIS — Z79899 Other long term (current) drug therapy: Secondary | ICD-10-CM | POA: Insufficient documentation

## 2019-09-05 DIAGNOSIS — S0990XA Unspecified injury of head, initial encounter: Secondary | ICD-10-CM

## 2019-09-05 DIAGNOSIS — F17200 Nicotine dependence, unspecified, uncomplicated: Secondary | ICD-10-CM | POA: Diagnosis not present

## 2019-09-05 DIAGNOSIS — Z8546 Personal history of malignant neoplasm of prostate: Secondary | ICD-10-CM | POA: Insufficient documentation

## 2019-09-05 DIAGNOSIS — Z7982 Long term (current) use of aspirin: Secondary | ICD-10-CM | POA: Diagnosis not present

## 2019-09-05 DIAGNOSIS — Z9181 History of falling: Secondary | ICD-10-CM | POA: Diagnosis not present

## 2019-09-05 DIAGNOSIS — W19XXXA Unspecified fall, initial encounter: Secondary | ICD-10-CM

## 2019-09-05 DIAGNOSIS — Y939 Activity, unspecified: Secondary | ICD-10-CM | POA: Insufficient documentation

## 2019-09-05 DIAGNOSIS — Y92009 Unspecified place in unspecified non-institutional (private) residence as the place of occurrence of the external cause: Secondary | ICD-10-CM | POA: Diagnosis not present

## 2019-09-05 DIAGNOSIS — I110 Hypertensive heart disease with heart failure: Secondary | ICD-10-CM | POA: Diagnosis not present

## 2019-09-05 DIAGNOSIS — C039 Malignant neoplasm of gum, unspecified: Secondary | ICD-10-CM | POA: Diagnosis not present

## 2019-09-05 DIAGNOSIS — W109XXA Fall (on) (from) unspecified stairs and steps, initial encounter: Secondary | ICD-10-CM | POA: Diagnosis not present

## 2019-09-05 DIAGNOSIS — S0001XA Abrasion of scalp, initial encounter: Secondary | ICD-10-CM

## 2019-09-05 DIAGNOSIS — J449 Chronic obstructive pulmonary disease, unspecified: Secondary | ICD-10-CM | POA: Diagnosis not present

## 2019-09-05 NOTE — ED Provider Notes (Signed)
[facility] Emergency Department Provider Note ____________________________________________   First MD Initiated Contact with Patient 09/05/19 1900     (approximate)  I have reviewed the triage vital signs and the nursing notes.   HISTORY  Chief Complaint Fall  HPI [name] [name] is a 73 y.o. male who presents to the emergency department for treatment and evaluation after a non-syncopal fall. He states that he was walking up his stairs and lost his balance. He states that this happens frequently and has fallen over "20 times" this year. Today, he fell then rolled down 4 stairs inside his home. In the process, glass from a picture was broken and he has scratches on his scalp. Initially he had pain in his right hip and left lower leg, but states that has since gone away. He took one of his percocet after the fall. He currently denies pain or any symptoms of concern.         Past Medical History:  Diagnosis Date   Anxiety    Anxiety disorder 05/05/2014   unspecifed   Cancer [facility])    prostate cancer    CHF (congestive heart failure) (HCC)    Colonic polyp    COPD (chronic obstructive pulmonary disease) (HCC)    Depression    Dyspnea    Elevated prostate specific antigen (PSA)    Essential (primary) hypertension 05/05/2014   GI bleed    due to diverticulitis    Heart murmur    hx of 1960s   History of thyroid cancer 05/05/2014   Hypercholesteremia    Hyperlipidemia    Hypertension    Hypothyroidism 05/05/2014   Impotence of organic origin    Male erectile dysfunction 12/29/2012   Malignant neoplasm of prostate ([place])    10/30/2011 T1c - Identified by needle biopsy . Gleason 7 (3+4) 5 of 12 cores, Bilateral    Prediabetes 06/22/2017   Prostate cancer ([place])    Shortness of breath on exertion 07/11/2015   Sleep apnea    CPAP , report on chart , not used in last 2 weeks    Thyroid cancer ([place])    Thyroid disease     Urinary obstruction    not elsewhere classified    Patient Active Problem List   Diagnosis Date Noted   Lymphedema 02/04/2019   Tobacco use 02/03/2019   CHF (congestive heart failure) ([place]) 12/23/2018   Acute on chronic diastolic CHF (congestive heart failure) ([place]) 11/27/2018   Cellulitis 11/27/2018   Hypothyroidism 11/27/2018   OSA (obstructive sleep apnea) 11/27/2018   HTN (hypertension) 11/27/2018   HLD (hyperlipidemia) 11/27/2018   Protein-calorie malnutrition ([place]) 03/02/2018   Neuropathy 03/02/2018   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   Difficulty in voiding 03/01/2018   Vitamin D deficiency 03/01/2018   Vitamin B12 deficiency 03/01/2018   Insomnia 03/01/2018   COPD (chronic obstructive pulmonary disease) ([place]) 02/11/2018   Pain due to onychomycosis of nail 02/11/2018   Onychomycosis 02/11/2018   Traumatic ecchymosis of foot, right, initial encounter 02/11/2018   Xerostomia 02/11/2018   Frequent falls 02/11/2018    Past Surgical History:  Procedure Laterality Date   BACK SURGERY     lumbar 1987    BACK SURGERY     C-6- Plate&Pin   CATARACT EXTRACTION W/PHACO Right 09/15/2018   Procedure: CATARACT EXTRACTION PHACO AND INTRAOCULAR LENS PLACEMENT ([place]) RIGHT;  Surgeon: Leandrew Koyanagi, MD;  Location: [place];  Service: Ophthalmology;  Laterality: Right;  Requests to be last case  CATARACT EXTRACTION W/PHACO Left 10/06/2018   Procedure: CATARACT EXTRACTION PHACO AND INTRAOCULAR LENS PLACEMENT ([place])  LEFT;  Surgeon: Leandrew Koyanagi, MD;  Location: [place];  Service: Ophthalmology;  Laterality: Left;   CERVICAL DISC SURGERY     COLONOSCOPY     10/06/2011, 06/05/2006, 05/06/2002 adematous polyps: CBF 09/2014; Recall Ltr mailed 02/27/2015 (dw)   COLONOSCOPY WITH PROPOFOL N/A 07/06/2015   Procedure: COLONOSCOPY WITH PROPOFOL;  Surgeon: Manya Silvas, MD;  Location: [facility] ENDOSCOPY;  Service: Endoscopy;   Laterality: N/A;   COLONOSCOPY WITH PROPOFOL N/A 09/01/2018   Procedure: COLONOSCOPY WITH PROPOFOL;  Surgeon: Manya Silvas, MD;  Location: [facility] ENDOSCOPY;  Service: Endoscopy;  Laterality: N/A;   HERNIA REPAIR     umbilical hernia repair    LAMINECTOMY FOR EXCISION / EVACUATION INTRASPINAL LESION     lumbar   OTHER SURGICAL HISTORY     right rotator cuff surgery    OTHER SURGICAL HISTORY     bilateral tubes in ears    POSTERIOR LAMINECTOMY / DECOMPRESSION CERVICAL SPINE     PROSTATE BIOPSY     10/22/2011 Volume:55.8 cc's, PSA:4.5, Free PSA:12%   ROBOT ASSISTED LAPAROSCOPIC RADICAL PROSTATECTOMY  12/15/2011   Procedure: ROBOTIC ASSISTED LAPAROSCOPIC RADICAL PROSTATECTOMY LEVEL 2;  Surgeon: Dutch Gray, MD;  Location[facility];  Service: Urology;  Laterality: N/A;       ROTATOR CUFF REPAIR Right    TONSILLECTOMY     TOTAL THYROIDECTOMY      Prior to Admission medications   Medication Sig Start Date End Date Taking? Authorizing Provider  acetaminophen (TYLENOL) 325 MG tablet Take 650 mg by mouth every 4 (four) hours as needed.     [provider]  albuterol (VENTOLIN HFA) 108 (90 Base) MCG/ACT inhaler Inhale 2 puffs into the lungs every 6 (six) hours as needed for wheezing or shortness of breath.    [provider]  aspirin EC 81 MG tablet Take 81 mg by mouth daily.     [provider]  budesonide-formoterol (SYMBICORT) 160-4.5 MCG/ACT inhaler Inhale 2 puffs into the lungs 2 (two) times daily.     [provider]  Cholecalciferol 25 MCG (1000 UT) tablet Take 1,000 Units by mouth daily.    [provider]  diazepam (VALIUM) 5 MG tablet Take 5 mg by mouth every 6 (six) hours as needed for muscle spasms.     [provider]  FLUoxetine (PROZAC) 40 MG capsule Take 40 mg by mouth daily.    [provider]  furosemide (LASIX) 20 MG tablet Take 1 tablet (20 mg total) by mouth daily. Patient taking differently: Take 40  mg by mouth 2 (two) times daily. Can take extra PRN 11/30/18   Otila Back, MD  gabapentin (NEURONTIN) 100 MG capsule Take 200 mg by mouth 3 (three) times daily.    [provider]  HYDROcodone-acetaminophen (NORCO/VICODIN) 5-325 MG tablet Take 1 tablet by mouth 3 (three) times daily as needed for moderate pain.    [provider]  ipratropium-albuterol (DUONEB) 0.5-2.5 (3) MG/3ML SOLN Take 3 mLs by nebulization every 6 (six) hours as needed (Shortness of breath, wheezing.). 02/10/18   Henreitta Leber, MD  levothyroxine (SYNTHROID, LEVOTHROID) 200 MCG tablet Take 200 mcg by mouth daily before breakfast.    [provider]  losartan (COZAAR) 50 MG tablet Take 50 mg by mouth daily. 10/19/18   [provider]  montelukast (SINGULAIR) 10 MG tablet Take 10 mg by mouth Nightly.  [provider]  potassium chloride SA (K-DUR,KLOR-CON) 20 MEQ tablet Take 20 mEq by mouth 2 (two) times daily.     [provider]  simvastatin (ZOCOR) 80 MG tablet Take 80 mg by mouth daily.     [provider]  tiotropium (SPIRIVA HANDIHALER) 18 MCG inhalation capsule Place 1 capsule into inhaler and inhale daily. 12/08/18   [provider]  verapamil (CALAN-SR) 180 MG CR tablet Take 180 mg by mouth 2 (two) times daily.     [provider]  zolpidem (AMBIEN) 5 MG tablet Take 5 mg by mouth at bedtime as needed for sleep.    [provider]    Allergies Patient has no known allergies.  Family History  Problem Relation Age of Onset   Hypertension Mother    Anxiety disorder Mother    Stroke Mother        "mini strokes"   Coronary artery disease Mother    Heart attack Mother    Emphysema Father    Heart disease Father    Scoliosis Father    Coronary artery disease Father    GU problems Neg Hx    Kidney disease Neg Hx    Prostate cancer Neg Hx     Social History Social History   Tobacco Use   Smoking status:  Current Every Day Smoker    Packs/day: 1.00    Years: 35.00    Pack years: 35.00   Smokeless tobacco: Former Systems developer  Substance Use Topics   Alcohol use: Yes    Alcohol/week: 2.0 standard drinks    Types: 1 Glasses of wine, 1 Shots of liquor per week   Drug use: No    Review of Systems  Constitutional: No fever/chills Eyes: No visual changes. ENT: No sore throat. Cardiovascular: Denies chest pain. Respiratory: Denies shortness of breath. Gastrointestinal: No abdominal pain.  No nausea, no vomiting.  No diarrhea.  No constipation. Genitourinary: Negative for dysuria. Musculoskeletal: Negative for back pain. Skin: Positive for abrasions to the scalp. Neurological: Negative for headaches, focal weakness or numbness. ___________________________________________   PHYSICAL EXAM:  VITAL SIGNS: ED Triage Vitals [09/05/19 1856]  Enc Vitals Group     BP (!) 126/45     Pulse Rate 67     Resp 20     Temp 98.6 F (37 C)     Temp Source Oral     SpO2 96 %     Weight 270 lb (122.5 kg)     Height 6\' 2"  (1.88 m)     Head Circumference      Peak Flow      Pain Score 7     Pain Loc      Pain Edu?      Excl. in [place]?     Constitutional: Alert and oriented.Chronically ill appearing and in no acute distress. Eyes: Conjunctivae are normal. PERRL. EOMI. Head: Atraumatic. Nose: No congestion/rhinnorhea. Mouth/Throat: Mucous membranes are moist.  Oropharynx non-erythematous. Neck: No stridor.   Hematological/Lymphatic/Immunilogical: No cervical lymphadenopathy. Cardiovascular:   Good peripheral circulation. Respiratory: Normal respiratory effort.  No retractions. Lungs CTAB. Gastrointestinal: Soft and nontender. No distention.  Genitourinary: Exam deferred Musculoskeletal: Diffuse bilateral lower extremity nonpitting edema.  No joint effusions.  Patient able to flex and extend both lower extremities without pain. Neurologic:  Normal speech and language. No gross focal neurologic  deficits are appreciated. No gait instability. Skin:  Skin is warm, dry and intact. No rash noted. Psychiatric: Mood and affect are  normal. Speech and behavior are normal.  ____________________________________________   LABS (all labs ordered are listed, but only abnormal results are displayed)  Labs Reviewed - No data to display ____________________________________________  EKG  Not indicated ____________________________________________  RADIOLOGY  ED MD interpretation:    CT of the head and cervical spine are negative for acute findings per radiology.  Official radiology report(s): Ct Head Wo Contrast  Result Date: 09/05/2019 CLINICAL DATA:  Status post fall. Patient was going up steps and became dizzy. EXAM: CT HEAD WITHOUT CONTRAST CT CERVICAL SPINE WITHOUT CONTRAST TECHNIQUE: Multidetector CT imaging of the head and cervical spine was performed following the standard protocol without intravenous contrast. Multiplanar CT image reconstructions of the cervical spine were also generated. COMPARISON:  None. FINDINGS: CT HEAD FINDINGS Brain: No evidence of acute infarction, hemorrhage, hydrocephalus, extra-axial collection or mass lesion/mass effect. There is mild diffuse low-attenuation within the subcortical and periventricular [place] matter compatible with chronic microvascular disease. Vascular: No hyperdense vessel or unexpected calcification. Skull: Normal. Negative for fracture or focal lesion. Sinuses/Orbits: No acute finding. Other: None CT CERVICAL SPINE FINDINGS Alignment: Normal Skull base and vertebrae: No acute fracture. No primary bone lesion or focal pathologic process. Soft tissues and spinal canal: No prevertebral fluid or swelling. No visible canal hematoma. Disc levels: Status post ACDF with solid fusion of C4-5. Degenerative disc disease identified at C5-6 and C6-7. Upper chest: Negative. Other: None IMPRESSION: 1. No acute intracranial abnormalities. 2. Chronic small  vessel ischemic change and brain atrophy. 3. No evidence for cervical spine fracture. 4. Status post ACDF with solid fusion of C4-5. Electronically Signed   By: Kerby Moors M.D.   On: 09/05/2019 19:59   Ct Cervical Spine Wo Contrast  Result Date: 09/05/2019 CLINICAL DATA:  Status post fall. Patient was going up steps and became dizzy. EXAM: CT HEAD WITHOUT CONTRAST CT CERVICAL SPINE WITHOUT CONTRAST TECHNIQUE: Multidetector CT imaging of the head and cervical spine was performed following the standard protocol without intravenous contrast. Multiplanar CT image reconstructions of the cervical spine were also generated. COMPARISON:  None. FINDINGS: CT HEAD FINDINGS Brain: No evidence of acute infarction, hemorrhage, hydrocephalus, extra-axial collection or mass lesion/mass effect. There is mild diffuse low-attenuation within the subcortical and periventricular [place] matter compatible with chronic microvascular disease. Vascular: No hyperdense vessel or unexpected calcification. Skull: Normal. Negative for fracture or focal lesion. Sinuses/Orbits: No acute finding. Other: None CT CERVICAL SPINE FINDINGS Alignment: Normal Skull base and vertebrae: No acute fracture. No primary bone lesion or focal pathologic process. Soft tissues and spinal canal: No prevertebral fluid or swelling. No visible canal hematoma. Disc levels: Status post ACDF with solid fusion of C4-5. Degenerative disc disease identified at C5-6 and C6-7. Upper chest: Negative. Other: None IMPRESSION: 1. No acute intracranial abnormalities. 2. Chronic small vessel ischemic change and brain atrophy. 3. No evidence for cervical spine fracture. 4. Status post ACDF with solid fusion of C4-5. Electronically Signed   By: Kerby Moors M.D.   On: 09/05/2019 19:59    ____________________________________________   PROCEDURES  Procedure(s) performed (including Critical Care):  Procedures  ____________________________________________   INITIAL  IMPRESSION / ASSESSMENT AND PLAN     73 year old male presenting to the emergency department for evaluation after a mechanical, nonsyncopal fall at home prior to arrival.  He denies any concerns.  Because he fell and rolled down 4 steps and takes aspirin daily, CT of the head and cervical spine will be obtained.  DIFFERENTIAL DIAGNOSIS  Intracranial hemorrhage, skull  fracture, cervical vertebral fracture, minor head injury  ED COURSE  CT of the head and cervical spine are reassuring.  Exam unchanged throughout the visit.  He was able to get out of the bed and into the wheelchair with no assistance.  He was advised to continue his medications as prescribed.  He was also encouraged to follow-up with his primary care provider.  He is to return to the emergency department for symptoms of concern if unable to schedule an appointment. ____________________________________________   FINAL CLINICAL IMPRESSION(S) / ED DIAGNOSES  Final diagnoses:  Fall in home, initial encounter  Abrasion of scalp, initial encounter  Minor head injury, initial encounter     ED Discharge Orders    None       Note:  This document was prepared using Dragon voice recognition software and may include unintentional dictation errors.   Victorino Dike, FNP 09/05/19 2240    Lilia Pro., MD 09/06/19 1257

## 2019-09-05 NOTE — ED Notes (Signed)
Pt back from CT. Given urinal as requested. Repositioned in bed by CT staff.

## 2019-09-05 NOTE — ED Triage Notes (Signed)
Presents to ed vis EMS s/p fall  States he was going up steeps  Became slightly dizzy fell  Having pain to right hip,left calf  And also small lacertion to head

## 2019-09-05 NOTE — Discharge Instructions (Signed)
Continue taking her medications including her pain medication as prescribed.  Follow-up with your primary care provider or return to the emergency department for symptoms of concern.

## 2019-09-14 ENCOUNTER — Ambulatory Visit: Payer: Medicare Other | Admitting: Family

## 2019-09-14 NOTE — Progress Notes (Deleted)
Patient ID: [name] [name], male    DOB: [birth year], 73 y.o.   MRN: [id number]  HPI  [name] [name] is a 73 y/o male with a history of hyperlipidemia, HTN, thyroid disease, sleep apnea, COPD, anxiety, depression, prostate cancer, current tobacco use and chronic heart failure.   Echo report from 11/27/2018 reviewed and showed an EF of 55-60%.  Admitted 12/23/2018 due to COPD/ HF exacerbation along with influenza. Medications adjusted and he was discharged after 4 days with home health.   He presents today for his initial visit with a chief complaint of   Past Medical History:  Diagnosis Date  . Anxiety   . Anxiety disorder 05/05/2014   unspecifed  . Cancer [facility])    prostate cancer   . CHF (congestive heart failure) ([place])   . Colonic polyp   . COPD (chronic obstructive pulmonary disease) ([place])   . Depression   . Dyspnea   . Elevated prostate specific antigen (PSA)   . Essential (primary) hypertension 05/05/2014  . GI bleed    due to diverticulitis   . Heart murmur    hx of 1960s  . History of thyroid cancer 05/05/2014  . Hypercholesteremia   . Hyperlipidemia   . Hypertension   . Hypothyroidism 05/05/2014  . Impotence of organic origin   . Male erectile dysfunction 12/29/2012  . Malignant neoplasm of prostate ([place])    10/30/2011 T1c - Identified by needle biopsy . Gleason 7 (3+4) 5 of 12 cores, Bilateral   . Prediabetes 06/22/2017  . Prostate cancer ([place])   . Shortness of breath on exertion 07/11/2015  . Sleep apnea    CPAP , report on chart , not used in last 2 weeks   . Thyroid cancer ([place])   . Thyroid disease   . Urinary obstruction    not elsewhere classified   Past Surgical History:  Procedure Laterality Date  . BACK SURGERY     lumbar 1987   . BACK SURGERY     C-6- Plate&Pin  . CATARACT EXTRACTION W/PHACO Right 09/15/2018   Procedure: CATARACT EXTRACTION PHACO AND INTRAOCULAR LENS PLACEMENT ([place]) RIGHT;  Surgeon: Leandrew Koyanagi, MD;  Location: [place];  Service: Ophthalmology;  Laterality: Right;  Requests to be last case  . CATARACT EXTRACTION W/PHACO Left 10/06/2018   Procedure: CATARACT EXTRACTION PHACO AND INTRAOCULAR LENS PLACEMENT ([place])  LEFT;  Surgeon: Leandrew Koyanagi, MD;  Location: [place];  Service: Ophthalmology;  Laterality: Left;  . CERVICAL DISC SURGERY    . COLONOSCOPY     10/06/2011, 06/05/2006, 05/06/2002 adematous polyps: CBF 09/2014; Recall Ltr mailed 02/27/2015 (dw)  . COLONOSCOPY WITH PROPOFOL N/A 07/06/2015   Procedure: COLONOSCOPY WITH PROPOFOL;  Surgeon: Manya Silvas, MD;  Location: [facility] ENDOSCOPY;  Service: Endoscopy;  Laterality: N/A;  . COLONOSCOPY WITH PROPOFOL N/A 09/01/2018   Procedure: COLONOSCOPY WITH PROPOFOL;  Surgeon: Manya Silvas, MD;  Location: [facility] ENDOSCOPY;  Service: Endoscopy;  Laterality: N/A;  . HERNIA REPAIR     umbilical hernia repair   . LAMINECTOMY FOR EXCISION / EVACUATION INTRASPINAL LESION     lumbar  . OTHER SURGICAL HISTORY     right rotator cuff surgery   . OTHER SURGICAL HISTORY     bilateral tubes in ears   . POSTERIOR LAMINECTOMY / DECOMPRESSION CERVICAL SPINE    . PROSTATE BIOPSY     10/22/2011 Volume:55.8 cc's, PSA:4.5, Free PSA:12%  . ROBOT ASSISTED LAPAROSCOPIC RADICAL PROSTATECTOMY  12/15/2011   Procedure: ROBOTIC  ASSISTED LAPAROSCOPIC RADICAL PROSTATECTOMY LEVEL 2;  Surgeon: Dutch Gray, MD;  [facility];  Service: Urology;  Laterality: N/A;      . ROTATOR CUFF REPAIR Right   . TONSILLECTOMY    . TOTAL THYROIDECTOMY     Family History  Problem Relation Age of Onset  . Hypertension Mother   . Anxiety disorder Mother   . Stroke Mother        "mini strokes"  . Coronary artery disease Mother   . Heart attack Mother   . Emphysema Father   . Heart disease Father   . Scoliosis Father   . Coronary artery disease Father   . GU problems Neg Hx   . Kidney disease Neg Hx   . Prostate cancer Neg Hx    Social History   Tobacco Use  .  Smoking status: Current Every Day Smoker    Packs/day: 1.00    Years: 35.00    Pack years: 35.00  . Smokeless tobacco: Former Network engineer Use Topics  . Alcohol use: Yes    Alcohol/week: 2.0 standard drinks    Types: 1 Glasses of wine, 1 Shots of liquor per week   No Known Allergies    Review of Systems  Constitutional: Positive for fatigue. Negative for appetite change.  HENT: Negative for congestion, rhinorrhea and sore throat.   Eyes: Negative.   Respiratory: Positive for cough (non-productive) and shortness of breath (with moderate exertion).   Cardiovascular: Positive for leg swelling. Negative for chest pain and palpitations.  Gastrointestinal: Negative for abdominal distention and abdominal pain.  Endocrine: Negative.   Genitourinary: Negative.   Musculoskeletal: Negative for back pain and neck pain.  Skin: Negative.   Allergic/Immunologic: Negative.   Neurological: Negative for dizziness and light-headedness.       Off balance/ frequent falls  Hematological: Negative for adenopathy. Bruises/bleeds easily.  Psychiatric/Behavioral: Negative for dysphoric mood and sleep disturbance (wearing CPAP nightly). The patient is not nervous/anxious.       Physical Exam Vitals signs and nursing note reviewed.  Constitutional:      Appearance: He is well-developed.  HENT:     Head: Normocephalic and atraumatic.  Neck:     Musculoskeletal: Normal range of motion and neck supple.     Vascular: No JVD.  Cardiovascular:     Rate and Rhythm: Normal rate and regular rhythm.  Pulmonary:     Effort: Pulmonary effort is normal. No respiratory distress.     Breath sounds: No wheezing or rales.  Abdominal:     Palpations: Abdomen is soft.     Tenderness: There is no abdominal tenderness.  Musculoskeletal:     Right lower leg: He exhibits no tenderness. Edema (2+ pitting) present.     Left lower leg: He exhibits no tenderness. Edema (2+ pitting) present.  Skin:    General:  Skin is warm and dry.  Neurological:     General: No focal deficit present.     Mental Status: He is alert and oriented to person, place, and time.  Psychiatric:        Mood and Affect: Mood normal.        Behavior: Behavior normal.    Assessment & Plan:  1: Chronic heart failure with preserved ejection fraction- - NYHA class II - euvolemic today - weighing daily and he was instructed to call for an overnight weight gain of >2 pounds or a weekly weight gain of >5 pounds - not adding salt and tries  to follow a low sodium diet. Importance of closely following a 2000mg  sodium diet was explained and written dietary information was given to him about this - saw cardiology ([place]) 10/26/18 - BNP 12/23/2018 was 89.0 - currently has home health/ PT; may benefit from paramedicine program as well - PharmD reconciled medications with the patient - he says that he's received his flu vaccine for this season  2: HTN- - BP looks good today - saw PCP ([place]) 01/28/2019 - BMP from 05/18/19 reviewed and showed sodium 143, potassium 4.2, creatinine 1.1, GFR 65  3: Tobacco use- - currently smokes 1 ppd of cigarettes - complete cessation discussed for 3 minutes with patient  4: Lymphedema- - stage 2 - limited in his ability to exercise due to frequent falls/ sense of being off-balance - unable to wear compression socks due to worsening shortness of breath when bending over to put them on - does elevate his legs "some" and he was encouraged to elevate them when sitting for long periods of time - consider lymphapress compression boots if edema persists.  Medication list was reviewed.  Return in 6 weeks or sooner for any questions/problems before then.

## 2019-12-19 ENCOUNTER — Telehealth: Payer: Self-pay

## 2019-12-19 ENCOUNTER — Ambulatory Visit: Payer: Medicare Other | Admitting: Gastroenterology

## 2019-12-19 NOTE — Telephone Encounter (Signed)
Patient contacted office this am to cancel his 1:45pm appt with Dr. Allen Norris.  He states that he is recovering from a fall and his equilibrium is a little off, and is dizzy.  He doesn't feel like he should be driving.  Informed patient that he should contact his PCP to make her aware of how he's feeling this am.  He said he has an appt with his neurologist.  Notified [facility] of patients cancellation.  Thanks [place], [place]

## 2019-12-19 NOTE — Progress Notes (Deleted)
Gastroenterology Consultation  Referring Provider:     Idelle Crouch, MD Primary Care Physician:  Idelle Crouch, MD Primary Gastroenterologist:  Dr. Allen Norris     Reason for Consultation:     GERD        HPI:   [name] [name] is a 74 y.o. y/o male referred for consultation & management of GERD by Dr. Doy Hutching, Leonie Douglas, MD.  This patient comes in today after having a colonoscopy by Dr. Vira Agar in 2016 for a personal history of colon polyps.  The patient was recommended to have a repeat colonoscopy in 2019.  The patient came in for the colonoscopy in 2019 and the procedure was aborted due to a large amount of stool in the colon and poor visualization as reported by Dr. Percell Boston note.  Past Medical History:  Diagnosis Date  . Anxiety   . Anxiety disorder 05/05/2014   unspecifed  . Cancer [facility])    prostate cancer   . CHF (congestive heart failure) ([place])   . Colonic polyp   . COPD (chronic obstructive pulmonary disease) ([place])   . Depression   . Dyspnea   . Elevated prostate specific antigen (PSA)   . Essential (primary) hypertension 05/05/2014  . GI bleed    due to diverticulitis   . Heart murmur    hx of 1960s  . History of thyroid cancer 05/05/2014  . Hypercholesteremia   . Hyperlipidemia   . Hypertension   . Hypothyroidism 05/05/2014  . Impotence of organic origin   . Male erectile dysfunction 12/29/2012  . Malignant neoplasm of prostate ([place])    10/30/2011 T1c - Identified by needle biopsy . Gleason 7 (3+4) 5 of 12 cores, Bilateral   . Prediabetes 06/22/2017  . Prostate cancer ([place])   . Shortness of breath on exertion 07/11/2015  . Sleep apnea    CPAP , report on chart , not used in last 2 weeks   . Thyroid cancer ([place])   . Thyroid disease   . Urinary obstruction    not elsewhere classified    Past Surgical History:  Procedure Laterality Date  . BACK SURGERY     lumbar 1987   . BACK SURGERY     C-6- Plate&Pin  . CATARACT EXTRACTION W/PHACO Right  09/15/2018   Procedure: CATARACT EXTRACTION PHACO AND INTRAOCULAR LENS PLACEMENT ([place]) RIGHT;  Surgeon: Leandrew Koyanagi, MD;  Location: [place];  Service: Ophthalmology;  Laterality: Right;  Requests to be last case  . CATARACT EXTRACTION W/PHACO Left 10/06/2018   Procedure: CATARACT EXTRACTION PHACO AND INTRAOCULAR LENS PLACEMENT ([place])  LEFT;  Surgeon: Leandrew Koyanagi, MD;  Location: [place];  Service: Ophthalmology;  Laterality: Left;  . CERVICAL DISC SURGERY    . COLONOSCOPY     10/06/2011, 06/05/2006, 05/06/2002 adematous polyps: CBF 09/2014; Recall Ltr mailed 02/27/2015 (dw)  . COLONOSCOPY WITH PROPOFOL N/A 07/06/2015   Procedure: COLONOSCOPY WITH PROPOFOL;  Surgeon: Manya Silvas, MD;  Location[facility] ENDOSCOPY;  Service: Endoscopy;  Laterality: N/A;  . COLONOSCOPY WITH PROPOFOL N/A 09/01/2018   Procedure: COLONOSCOPY WITH PROPOFOL;  Surgeon: Manya Silvas, MD;  Location: [facility] ENDOSCOPY;  Service: Endoscopy;  Laterality: N/A;  . HERNIA REPAIR     umbilical hernia repair   . LAMINECTOMY FOR EXCISION / EVACUATION INTRASPINAL LESION     lumbar  . OTHER SURGICAL HISTORY     right rotator cuff surgery   . OTHER SURGICAL HISTORY     bilateral tubes  in ears   . POSTERIOR LAMINECTOMY / DECOMPRESSION CERVICAL SPINE    . PROSTATE BIOPSY     10/22/2011 Volume:55.8 cc's, PSA:4.5, Free PSA:12%  . ROBOT ASSISTED LAPAROSCOPIC RADICAL PROSTATECTOMY  12/15/2011   Procedure: ROBOTIC ASSISTED LAPAROSCOPIC RADICAL PROSTATECTOMY LEVEL 2;  Surgeon: Dutch Gray, MD;  Location: [facility];  Service: Urology;  Laterality: N/A;      . ROTATOR CUFF REPAIR Right   . TONSILLECTOMY    . TOTAL THYROIDECTOMY      Prior to Admission medications   Medication Sig Start Date End Date Taking? Authorizing Provider  acetaminophen (TYLENOL) 325 MG tablet Take 650 mg by mouth every 4 (four) hours as needed.     [provider]  albuterol (VENTOLIN HFA) 108 (90 Base) MCG/ACT  inhaler Inhale 2 puffs into the lungs every 6 (six) hours as needed for wheezing or shortness of breath.    [provider]  aspirin EC 81 MG tablet Take 81 mg by mouth daily.     [provider]  budesonide-formoterol (SYMBICORT) 160-4.5 MCG/ACT inhaler Inhale 2 puffs into the lungs 2 (two) times daily.     [provider]  Cholecalciferol 25 MCG (1000 UT) tablet Take 1,000 Units by mouth daily.    [provider]  diazepam (VALIUM) 5 MG tablet Take 5 mg by mouth every 6 (six) hours as needed for muscle spasms.     [provider]  FLUoxetine (PROZAC) 40 MG capsule Take 40 mg by mouth daily.    [provider]  furosemide (LASIX) 20 MG tablet Take 1 tablet (20 mg total) by mouth daily. Patient taking differently: Take 40 mg by mouth 2 (two) times daily. Can take extra PRN 11/30/18   Otila Back, MD  gabapentin (NEURONTIN) 100 MG capsule Take 200 mg by mouth 3 (three) times daily.    [provider]  HYDROcodone-acetaminophen (NORCO/VICODIN) 5-325 MG tablet Take 1 tablet by mouth 3 (three) times daily as needed for moderate pain.    [provider]  ipratropium-albuterol (DUONEB) 0.5-2.5 (3) MG/3ML SOLN Take 3 mLs by nebulization every 6 (six) hours as needed (Shortness of breath, wheezing.). 02/10/18   Henreitta Leber, MD  levothyroxine (SYNTHROID, LEVOTHROID) 200 MCG tablet Take 200 mcg by mouth daily before breakfast.    [provider]  losartan (COZAAR) 50 MG tablet Take 50 mg by mouth daily. 10/19/18   [provider]  montelukast (SINGULAIR) 10 MG tablet Take 10 mg by mouth Nightly.    [provider]  potassium chloride SA (K-DUR,KLOR-CON) 20 MEQ tablet Take 20 mEq by mouth 2 (two) times daily.     [provider]  simvastatin (ZOCOR) 80 MG tablet Take 80 mg by mouth daily.     [provider]  tiotropium (SPIRIVA HANDIHALER) 18 MCG inhalation capsule Place 1 capsule into inhaler  and inhale daily. 12/08/18   [provider]  verapamil (CALAN-SR) 180 MG CR tablet Take 180 mg by mouth 2 (two) times daily.     [provider]  zolpidem (AMBIEN) 5 MG tablet Take 5 mg by mouth at bedtime as needed for sleep.    [provider]    Family History  Problem Relation Age of Onset  . Hypertension Mother   . Anxiety disorder Mother   . Stroke Mother        "mini strokes"  . Coronary artery disease Mother   . Heart attack Mother   . Emphysema Father   .  Heart disease Father   . Scoliosis Father   . Coronary artery disease Father   . GU problems Neg Hx   . Kidney disease Neg Hx   . Prostate cancer Neg Hx      Social History   Tobacco Use  . Smoking status: Current Every Day Smoker    Packs/day: 1.00    Years: 35.00    Pack years: 35.00  . Smokeless tobacco: Former Network engineer Use Topics  . Alcohol use: Yes    Alcohol/week: 2.0 standard drinks    Types: 1 Glasses of wine, 1 Shots of liquor per week  . Drug use: No    Allergies as of 12/19/2019  . (No Known Allergies)    Review of Systems:    All systems reviewed and negative except where noted in HPI.   Physical Exam:  There were no vitals taken for this visit. No LMP for male patient. General:   Alert,  Well-developed, ***well-nourished, pleasant and cooperative in NAD Head:  Normocephalic and atraumatic. Eyes:  Sclera clear, no icterus.   Conjunctiva pink. Ears:  Normal auditory acuity. Neck:  Supple; no masses or thyromegaly. Lungs:  Respirations even and unlabored.  Clear throughout to auscultation.   No wheezes, crackles, or rhonchi. No acute distress. Heart:  Regular rate and rhythm; no murmurs, clicks, rubs, or gallops. Abdomen:  Normal bowel sounds.  No bruits.  Soft, non-tender and non-distended without masses, hepatosplenomegaly or hernias noted.  No guarding or rebound tenderness.  ***Negative Carnett sign.   Rectal:  Deferred.***  Pulses:  Normal pulses  noted. Extremities:  No clubbing or edema.  No cyanosis. Neurologic:  Alert and oriented x3;  grossly normal neurologically. Skin:  Intact without significant lesions or rashes.  ***No jaundice. Lymph Nodes:  No significant cervical adenopathy. Psych:  Alert and cooperative. Normal mood and affect.  Imaging Studies: No results found.  Assessment and Plan:   SCORPIO EASTERLY is a 74 y.o. y/o male ***    Lucilla Lame, MD. Marval Regal    Note: This dictation was prepared with Dragon dictation along with smaller phrase technology. Any transcriptional errors that result from this process are unintentional.

## 2020-02-13 DIAGNOSIS — G5603 Carpal tunnel syndrome, bilateral upper limbs: Secondary | ICD-10-CM | POA: Insufficient documentation

## 2020-02-20 ENCOUNTER — Ambulatory Visit: Payer: Medicare Other | Admitting: Physical Therapy

## 2020-03-26 ENCOUNTER — Other Ambulatory Visit: Payer: Self-pay

## 2020-03-26 ENCOUNTER — Ambulatory Visit (INDEPENDENT_AMBULATORY_CARE_PROVIDER_SITE_OTHER): Payer: Medicare Other | Admitting: Gastroenterology

## 2020-03-26 ENCOUNTER — Encounter: Payer: Self-pay | Admitting: Gastroenterology

## 2020-03-26 DIAGNOSIS — R1310 Dysphagia, unspecified: Secondary | ICD-10-CM

## 2020-03-26 DIAGNOSIS — R1319 Other dysphagia: Secondary | ICD-10-CM

## 2020-03-26 DIAGNOSIS — Z86010 Personal history of colonic polyps: Secondary | ICD-10-CM

## 2020-03-26 NOTE — Progress Notes (Signed)
Gastroenterology Consultation  Referring Provider:     Idelle Crouch, MD Primary Care Physician:  Idelle Crouch, MD Primary Gastroenterologist:  Dr. Allen Norris     Reason for Consultation:     Dysphagia and history of colon polyps        HPI:   [name] [name] is a 74 y.o. y/o male referred for consultation & management of dysphagia and history of colon polyps by Dr. Doy Hutching, Leonie Douglas, MD.  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  The patient had a colonoscopy in 2016 with adenomatous polyps found and was recommended to have a repeat colonoscopy in 3 years.  The patient came back in 2019 for his repeat colonoscopy and had a large amount of solid stools and was sent home.  The patient never followed up with a repeat colonoscopy.  The patient now reports that he has had some problems swallowing with solid foods such as bread chicken steak.  There is no report of any unexplained weight loss.  The patient denies any change in bowel habits.  He states that he took the entire prep of 4 L at his last colonoscopy and is not sure why he did not have good results.  There is no report of any abdominal pain.  The patient does report a history of COPD and some leg swelling.  Past Medical History:  Diagnosis Date  . Anxiety   . Anxiety disorder 05/05/2014   unspecifed  . Cancer [facility])    prostate cancer   . CHF (congestive heart failure) ([place])   . Colonic polyp   . COPD (chronic obstructive pulmonary disease) ([place])   . Depression   . Dyspnea   . Elevated prostate specific antigen (PSA)   . Essential (primary) hypertension 05/05/2014  . GI bleed    due to diverticulitis   . Heart murmur    hx of 1960s  . History of thyroid cancer 05/05/2014  . Hypercholesteremia   . Hyperlipidemia   . Hypertension   . Hypothyroidism 05/05/2014  . Impotence of organic origin   . Male erectile dysfunction 12/29/2012  . Malignant neoplasm of prostate ([place])    10/30/2011 T1c - Identified by needle biopsy . Gleason 7 (3+4) 5 of 12 cores, Bilateral   . Prediabetes 06/22/2017  . Prostate cancer ([place])   . Shortness of breath on exertion 07/11/2015  . Sleep apnea    CPAP , report on chart , not used in last 2 weeks   . Thyroid cancer ([place])   . Thyroid disease   . Urinary obstruction    not elsewhere classified    Past Surgical History:  Procedure Laterality Date  . BACK SURGERY     lumbar 1987   . BACK SURGERY     C-6- Plate&Pin  . CATARACT EXTRACTION W/PHACO Right 09/15/2018   Procedure: CATARACT EXTRACTION PHACO AND INTRAOCULAR LENS PLACEMENT ([place]) RIGHT;  Surgeon: Leandrew Koyanagi, MD;  Location: [place];  Service: Ophthalmology;  Laterality: Right;  Requests to be last case  . CATARACT EXTRACTION W/PHACO Left 10/06/2018   Procedure: CATARACT EXTRACTION PHACO AND INTRAOCULAR LENS PLACEMENT ([place])  LEFT;  Surgeon: Leandrew Koyanagi, MD;  Location: [place];  Service: Ophthalmology;  Laterality: Left;  . CERVICAL DISC SURGERY    . COLONOSCOPY     10/06/2011, 06/05/2006, 05/06/2002 adematous polyps: CBF 09/2014; Recall Ltr mailed 02/27/2015 (dw)  . COLONOSCOPY WITH PROPOFOL N/A 07/06/2015  Procedure: COLONOSCOPY WITH PROPOFOL;  Surgeon: Manya Silvas, MD;  Location: [facility] ENDOSCOPY;  Service: Endoscopy;  Laterality: N/A;  . COLONOSCOPY WITH PROPOFOL N/A 09/01/2018   Procedure: COLONOSCOPY WITH PROPOFOL;  Surgeon: Manya Silvas, MD;  Location[facility] ENDOSCOPY;  Service: Endoscopy;  Laterality: N/A;  . HERNIA REPAIR     umbilical hernia repair   . LAMINECTOMY FOR EXCISION / EVACUATION INTRASPINAL LESION     lumbar  . OTHER SURGICAL HISTORY     right rotator cuff surgery   . OTHER SURGICAL HISTORY     bilateral tubes in ears   . POSTERIOR LAMINECTOMY / DECOMPRESSION CERVICAL SPINE    . PROSTATE BIOPSY     10/22/2011 Volume:55.8 cc's, PSA:4.5, Free PSA:12%  . ROBOT ASSISTED LAPAROSCOPIC RADICAL PROSTATECTOMY   12/15/2011   Procedure: ROBOTIC ASSISTED LAPAROSCOPIC RADICAL PROSTATECTOMY LEVEL 2;  Surgeon: Dutch Gray, MD;  Location[facility];  Service: Urology;  Laterality: N/A;      . ROTATOR CUFF REPAIR Right   . TONSILLECTOMY    . TOTAL THYROIDECTOMY      Prior to Admission medications   Medication Sig Start Date End Date Taking? Authorizing Provider  albuterol (VENTOLIN HFA) 108 (90 Base) MCG/ACT inhaler Inhale 2 puffs into the lungs every 6 (six) hours as needed for wheezing or shortness of breath.   Yes [provider]  budesonide-formoterol (SYMBICORT) 160-4.5 MCG/ACT inhaler Inhale 2 puffs into the lungs 2 (two) times daily.    Yes [provider]  clotrimazole (MYCELEX) 10 MG troche  12/30/19  Yes [provider]  diazepam (VALIUM) 5 MG tablet Take 5 mg by mouth every 6 (six) hours as needed for muscle spasms.    Yes [provider]  FLUoxetine (PROZAC) 40 MG capsule Take 40 mg by mouth daily.   Yes [provider]  furosemide (LASIX) 20 MG tablet Take 1 tablet (20 mg total) by mouth daily. Patient taking differently: Take 40 mg by mouth 2 (two) times daily. Can take extra PRN 11/30/18  Yes Ojie, Jude, MD  gabapentin (NEURONTIN) 100 MG capsule Take 200 mg by mouth 3 (three) times daily.   Yes [provider]  HYDROcodone-acetaminophen (NORCO/VICODIN) 5-325 MG tablet Take 1 tablet by mouth 3 (three) times daily as needed for moderate pain.   Yes [provider]  isosorbide mononitrate (IMDUR) 60 MG 24 hr tablet  03/12/20  Yes [provider]  levothyroxine (SYNTHROID, LEVOTHROID) 200 MCG tablet Take 200 mcg by mouth daily before breakfast.   Yes [provider]  losartan (COZAAR) 50 MG tablet Take 50 mg by mouth daily. 10/19/18  Yes [provider]  methocarbamol (ROBAXIN) 500 MG tablet  03/20/20  Yes [provider]  potassium chloride SA (K-DUR,KLOR-CON) 20 MEQ tablet Take 20 mEq by mouth 2 (two) times  daily.    Yes [provider]  simvastatin (ZOCOR) 80 MG tablet Take 80 mg by mouth daily.    Yes [provider]  tiotropium (SPIRIVA HANDIHALER) 18 MCG inhalation capsule Place 1 capsule into inhaler and inhale daily. 12/08/18  Yes [provider]  tiZANidine (ZANAFLEX) 4 MG tablet  03/12/20  Yes [provider]  traZODone (DESYREL) 100 MG tablet  02/04/20  Yes [provider]  verapamil (CALAN-SR) 180 MG CR tablet Take 180 mg by mouth 2 (two) times daily.    Yes [provider]  Vitamin D, Ergocalciferol, (DRISDOL) 1.25 MG (50000 UNIT) CAPS capsule  02/06/20  Yes [provider]  acetaminophen (TYLENOL) 325 MG tablet Take 650 mg by mouth every 4 (four) hours as needed.     [provider]  aspirin EC 81 MG tablet Take 81 mg by mouth daily.     [provider]  Cholecalciferol 25 MCG (1000 UT) tablet Take 1,000 Units by mouth daily.    [provider]  Eszopiclone 3 MG TABS  01/16/20   [provider]  ipratropium-albuterol (DUONEB) 0.5-2.5 (3) MG/3ML SOLN Take 3 mLs by nebulization every 6 (six) hours as needed (Shortness of breath, wheezing.). Patient not taking: Reported on 03/26/2020 02/10/18   Henreitta Leber, MD  montelukast (SINGULAIR) 10 MG tablet Take 10 mg by mouth Nightly.    [provider]  zolpidem (AMBIEN) 5 MG tablet Take 5 mg by mouth at bedtime as needed for sleep.    [provider]    Family History  Problem Relation Age of Onset  . Hypertension Mother   . Anxiety disorder Mother   . Stroke Mother        "mini strokes"  . Coronary artery disease Mother   . Heart attack Mother   . Emphysema Father   . Heart disease Father   . Scoliosis Father   . Coronary artery disease Father   . GU problems Neg Hx   . Kidney disease Neg Hx   . Prostate cancer Neg Hx      Social History   Tobacco Use  . Smoking status: Current Every Day Smoker    Packs/day: 1.00     Years: 35.00    Pack years: 35.00  . Smokeless tobacco: Former Network engineer Use Topics  . Alcohol use: Yes    Alcohol/week: 2.0 standard drinks    Types: 1 Glasses of wine, 1 Shots of liquor per week  . Drug use: No    Allergies as of 03/26/2020 - Review Complete 03/26/2020  Allergen Reaction Noted  . Ibuprofen  12/25/2009    Review of Systems:    All systems reviewed and negative except where noted in HPI.   Physical Exam:  There were no vitals taken for this visit. No LMP for male patient. General:   Alert,  Well-developed, well-nourished, pleasant and cooperative in NAD Head:  Normocephalic and atraumatic. Eyes:  Sclera clear, no icterus.   Conjunctiva pink. Ears:  Normal auditory acuity. Neck:  Supple; no masses or thyromegaly. Lungs:  Respirations even and unlabored.  Rhonchi throughout to auscultation.   No wheezes, crackles, or rhonchi. No acute distress. Heart:  Regular rate and rhythm; no murmurs, clicks, rubs, or gallops. Abdomen:  Normal bowel sounds.  No bruits.  Soft, non-tender and non-distended without masses, hepatosplenomegaly or hernias noted.  No guarding or rebound tenderness.  Negative Carnett sign.   Rectal:  Deferred.  Pulses:  Normal pulses noted. Extremities: +1 pitting edema.  No cyanosis. Neurologic:  Alert and oriented x3;  grossly normal neurologically. Skin:  Intact without significant lesions or rashes.  No jaundice. Lymph Nodes:  No significant cervical adenopathy. Psych:  Alert and cooperative. Normal mood and affect.  Imaging Studies: No results found.  Assessment and Plan:   NAVARRO BRANCATO is a 74 y.o. y/o male who comes in today with a history of colon polyps.  The patient's last colonoscopy was in 2016 with adenomatous polyps.  The patient also has dysphagia to solid foods.  The patient will be set up for an EGD and colonoscopy.  The patient has been  given 2 separate samples of prep for his colonoscopy.  The patient has been told to  take 1 prep 2 days before the procedure and the other prep the day before the procedure so that we can make sure he is cleaned out for the colonoscopy.  The patient has been explained the plan and agrees with it.    Lucilla Lame, MD. Marval Regal    Note: This dictation was prepared with Dragon dictation along with smaller phrase technology. Any transcriptional errors that result from this process are unintentional.

## 2020-04-13 ENCOUNTER — Other Ambulatory Visit: Payer: Self-pay

## 2020-04-13 ENCOUNTER — Other Ambulatory Visit
Admission: RE | Admit: 2020-04-13 | Discharge: 2020-04-13 | Disposition: A | Discharge status: Home / Self Care | Payer: Medicare Other | Source: Ambulatory Visit | Attending: Gastroenterology | Admitting: Gastroenterology

## 2020-04-13 DIAGNOSIS — Z01812 Encounter for preprocedural laboratory examination: Secondary | ICD-10-CM | POA: Insufficient documentation

## 2020-04-13 DIAGNOSIS — Z20822 Contact with and (suspected) exposure to covid-19: Secondary | ICD-10-CM | POA: Diagnosis not present

## 2020-04-14 LAB — SARS CORONAVIRUS 2 (TAT 6-24 HRS): SARS Coronavirus 2: NEGATIVE

## 2020-04-17 ENCOUNTER — Encounter
Admission: RE | Disposition: A | Discharge status: Home / Self Care | Payer: Self-pay | Source: Home / Self Care | Attending: Gastroenterology

## 2020-04-17 ENCOUNTER — Other Ambulatory Visit: Payer: Self-pay

## 2020-04-17 ENCOUNTER — Ambulatory Visit: Payer: Medicare Other | Admitting: Certified Registered Nurse Anesthetist

## 2020-04-17 ENCOUNTER — Ambulatory Visit
Admission: RE | Admit: 2020-04-17 | Discharge: 2020-04-17 | Disposition: A | Discharge status: Home / Self Care | Payer: Medicare Other | Attending: Gastroenterology | Admitting: Gastroenterology

## 2020-04-17 ENCOUNTER — Encounter: Payer: Self-pay | Admitting: Gastroenterology

## 2020-04-17 DIAGNOSIS — Z79890 Hormone replacement therapy (postmenopausal): Secondary | ICD-10-CM | POA: Insufficient documentation

## 2020-04-17 DIAGNOSIS — Z860100 Personal history of colon polyps, unspecified: Secondary | ICD-10-CM

## 2020-04-17 DIAGNOSIS — K219 Gastro-esophageal reflux disease without esophagitis: Secondary | ICD-10-CM | POA: Insufficient documentation

## 2020-04-17 DIAGNOSIS — I110 Hypertensive heart disease with heart failure: Secondary | ICD-10-CM | POA: Diagnosis not present

## 2020-04-17 DIAGNOSIS — Z1211 Encounter for screening for malignant neoplasm of colon: Secondary | ICD-10-CM | POA: Diagnosis not present

## 2020-04-17 DIAGNOSIS — Z7982 Long term (current) use of aspirin: Secondary | ICD-10-CM | POA: Insufficient documentation

## 2020-04-17 DIAGNOSIS — K648 Other hemorrhoids: Secondary | ICD-10-CM | POA: Diagnosis not present

## 2020-04-17 DIAGNOSIS — F419 Anxiety disorder, unspecified: Secondary | ICD-10-CM | POA: Insufficient documentation

## 2020-04-17 DIAGNOSIS — K5730 Diverticulosis of large intestine without perforation or abscess without bleeding: Secondary | ICD-10-CM | POA: Insufficient documentation

## 2020-04-17 DIAGNOSIS — E7800 Pure hypercholesterolemia, unspecified: Secondary | ICD-10-CM | POA: Insufficient documentation

## 2020-04-17 DIAGNOSIS — I509 Heart failure, unspecified: Secondary | ICD-10-CM | POA: Insufficient documentation

## 2020-04-17 DIAGNOSIS — D122 Benign neoplasm of ascending colon: Secondary | ICD-10-CM | POA: Insufficient documentation

## 2020-04-17 DIAGNOSIS — E039 Hypothyroidism, unspecified: Secondary | ICD-10-CM | POA: Insufficient documentation

## 2020-04-17 DIAGNOSIS — R1310 Dysphagia, unspecified: Secondary | ICD-10-CM | POA: Diagnosis present

## 2020-04-17 DIAGNOSIS — D120 Benign neoplasm of cecum: Secondary | ICD-10-CM | POA: Insufficient documentation

## 2020-04-17 DIAGNOSIS — G4730 Sleep apnea, unspecified: Secondary | ICD-10-CM | POA: Diagnosis not present

## 2020-04-17 DIAGNOSIS — Z8546 Personal history of malignant neoplasm of prostate: Secondary | ICD-10-CM | POA: Insufficient documentation

## 2020-04-17 DIAGNOSIS — K222 Esophageal obstruction: Secondary | ICD-10-CM | POA: Diagnosis present

## 2020-04-17 DIAGNOSIS — Z85850 Personal history of malignant neoplasm of thyroid: Secondary | ICD-10-CM | POA: Insufficient documentation

## 2020-04-17 DIAGNOSIS — K635 Polyp of colon: Secondary | ICD-10-CM

## 2020-04-17 DIAGNOSIS — E785 Hyperlipidemia, unspecified: Secondary | ICD-10-CM | POA: Insufficient documentation

## 2020-04-17 DIAGNOSIS — D125 Benign neoplasm of sigmoid colon: Secondary | ICD-10-CM | POA: Insufficient documentation

## 2020-04-17 DIAGNOSIS — Z79899 Other long term (current) drug therapy: Secondary | ICD-10-CM | POA: Diagnosis not present

## 2020-04-17 DIAGNOSIS — J449 Chronic obstructive pulmonary disease, unspecified: Secondary | ICD-10-CM | POA: Diagnosis not present

## 2020-04-17 DIAGNOSIS — F329 Major depressive disorder, single episode, unspecified: Secondary | ICD-10-CM | POA: Diagnosis not present

## 2020-04-17 DIAGNOSIS — D124 Benign neoplasm of descending colon: Secondary | ICD-10-CM | POA: Diagnosis not present

## 2020-04-17 HISTORY — PX: ESOPHAGOGASTRODUODENOSCOPY (EGD) WITH PROPOFOL: SHX5813

## 2020-04-17 HISTORY — PX: COLONOSCOPY WITH PROPOFOL: SHX5780

## 2020-04-17 LAB — KOH PREP

## 2020-04-17 SURGERY — COLONOSCOPY WITH PROPOFOL
Anesthesia: General

## 2020-04-17 MED ORDER — PROPOFOL 500 MG/50ML IV EMUL
INTRAVENOUS | Status: DC | PRN
  Administered 2020-04-17: 130 ug/kg/min via INTRAVENOUS

## 2020-04-17 MED ORDER — SODIUM CHLORIDE 0.9 % IV SOLN: INTRAVENOUS | Status: DC

## 2020-04-17 MED ORDER — LIDOCAINE HCL (CARDIAC) PF 100 MG/5ML IV SOSY
PREFILLED_SYRINGE | INTRAVENOUS | Status: DC | PRN
  Administered 2020-04-17: 100 mg via INTRAVENOUS

## 2020-04-17 MED ORDER — PROPOFOL 10 MG/ML IV BOLUS
INTRAVENOUS | Status: DC | PRN
  Administered 2020-04-17: 60 mg via INTRAVENOUS

## 2020-04-17 MED ORDER — GLYCOPYRROLATE 0.2 MG/ML IJ SOLN
INTRAMUSCULAR | Status: DC | PRN
  Administered 2020-04-17: .2 mg via INTRAVENOUS

## 2020-04-17 NOTE — Anesthesia Postprocedure Evaluation (Signed)
Anesthesia Post Note  Patient: [name] [name]  Procedure(s) Performed: COLONOSCOPY WITH PROPOFOL (N/A ) ESOPHAGOGASTRODUODENOSCOPY (EGD) WITH PROPOFOL (N/A )  Patient location during evaluation: Endoscopy Anesthesia Type: General Level of consciousness: awake and alert and oriented Pain management: pain level controlled Vital Signs Assessment: post-procedure vital signs reviewed and stable Respiratory status: spontaneous breathing, nonlabored ventilation and respiratory function stable Cardiovascular status: blood pressure returned to baseline and stable Postop Assessment: no signs of nausea or vomiting Anesthetic complications: no     Last Vitals:  Vitals:   04/17/20 1120 04/17/20 1130  BP: 134/87 (!) 154/84  Pulse: 60 60  Resp: 11 10  Temp:    SpO2: 99% 99%    Last Pain:  Vitals:   04/17/20 1100  TempSrc: Temporal  PainSc:                  Janei Scheff

## 2020-04-17 NOTE — H&P (Signed)
Lucilla Lame, MD [address] [place], [place] [address] Phone:[phone] Fax : [phone]  Primary Care Physician:  Idelle Crouch, MD Primary Gastroenterologist:  Dr. Allen Norris  Pre-Procedure History & Physical: HPI:  [name] [name] is a 74 y.o. male is here for an endoscopy and colonoscopy.   Past Medical History:  Diagnosis Date  . Anxiety   . Anxiety disorder 05/05/2014   unspecifed  . Cancer [facility])    prostate cancer   . CHF (congestive heart failure) ([place])   . Colonic polyp   . COPD (chronic obstructive pulmonary disease) ([place])   . Depression   . Dyspnea   . Elevated prostate specific antigen (PSA)   . Essential (primary) hypertension 05/05/2014  . GI bleed    due to diverticulitis   . Heart murmur    hx of 1960s  . History of thyroid cancer 05/05/2014  . Hypercholesteremia   . Hyperlipidemia   . Hypertension   . Hypothyroidism 05/05/2014  . Impotence of organic origin   . Male erectile dysfunction 12/29/2012  . Malignant neoplasm of prostate ([place])    10/30/2011 T1c - Identified by needle biopsy . Gleason 7 (3+4) 5 of 12 cores, Bilateral   . Prediabetes 06/22/2017  . Prostate cancer ([place])   . Shortness of breath on exertion 07/11/2015  . Sleep apnea    CPAP , report on chart , not used in last 2 weeks   . Thyroid cancer ([place])   . Thyroid disease   . Urinary obstruction    not elsewhere classified    Past Surgical History:  Procedure Laterality Date  . BACK SURGERY     lumbar 1987   . BACK SURGERY     C-6- Plate&Pin  . CATARACT EXTRACTION W/PHACO Right 09/15/2018   Procedure: CATARACT EXTRACTION PHACO AND INTRAOCULAR LENS PLACEMENT ([place]) RIGHT;  Surgeon: Leandrew Koyanagi, MD;  Location: [place];  Service: Ophthalmology;  Laterality: Right;  Requests to be last case  . CATARACT EXTRACTION W/PHACO Left 10/06/2018   Procedure: CATARACT EXTRACTION PHACO AND INTRAOCULAR LENS PLACEMENT ([place])  LEFT;  Surgeon: Leandrew Koyanagi, MD;  Location: [place];  Service: Ophthalmology;  Laterality: Left;  . CERVICAL DISC SURGERY    . COLONOSCOPY     10/06/2011, 06/05/2006, 05/06/2002 adematous polyps: CBF 09/2014; Recall Ltr mailed 02/27/2015 (dw)  . COLONOSCOPY WITH PROPOFOL N/A 07/06/2015   Procedure: COLONOSCOPY WITH PROPOFOL;  Surgeon: Manya Silvas, MD;  Location: [facility] ENDOSCOPY;  Service: Endoscopy;  Laterality: N/A;  . COLONOSCOPY WITH PROPOFOL N/A 09/01/2018   Procedure: COLONOSCOPY WITH PROPOFOL;  Surgeon: Manya Silvas, MD;  Location: [facility] ENDOSCOPY;  Service: Endoscopy;  Laterality: N/A;  . HERNIA REPAIR     umbilical hernia repair   . LAMINECTOMY FOR EXCISION / EVACUATION INTRASPINAL LESION     lumbar  . OTHER SURGICAL HISTORY     right rotator cuff surgery   . OTHER SURGICAL HISTORY     bilateral tubes in ears   . POSTERIOR LAMINECTOMY / DECOMPRESSION CERVICAL SPINE    . PROSTATE BIOPSY     10/22/2011 Volume:55.8 cc's, PSA:4.5, Free PSA:12%  . ROBOT ASSISTED LAPAROSCOPIC RADICAL PROSTATECTOMY  12/15/2011   Procedure: ROBOTIC ASSISTED LAPAROSCOPIC RADICAL PROSTATECTOMY LEVEL 2;  Surgeon: Dutch Gray, MD;  Location: [facility];  Service: Urology;  Laterality: N/A;      . ROTATOR CUFF REPAIR Right   . TONSILLECTOMY    . TOTAL THYROIDECTOMY  Prior to Admission medications   Medication Sig Start Date End Date Taking? Authorizing Provider  acetaminophen (TYLENOL) 325 MG tablet Take 650 mg by mouth every 4 (four) hours as needed.    Yes [provider]  albuterol (VENTOLIN HFA) 108 (90 Base) MCG/ACT inhaler Inhale 2 puffs into the lungs every 6 (six) hours as needed for wheezing or shortness of breath.   Yes [provider]  aspirin EC 81 MG tablet Take 81 mg by mouth daily.    Yes [provider]  budesonide-formoterol (SYMBICORT) 160-4.5 MCG/ACT inhaler Inhale 2 puffs into the lungs 2 (two) times daily.    Yes [provider]  Cholecalciferol 25 MCG  (1000 UT) tablet Take 1,000 Units by mouth daily.   Yes [provider]  clotrimazole (MYCELEX) 10 MG troche  12/30/19  Yes [provider]  diazepam (VALIUM) 5 MG tablet Take 5 mg by mouth every 6 (six) hours as needed for muscle spasms.    Yes [provider]  Eszopiclone 3 MG TABS  01/16/20  Yes [provider]  FLUoxetine (PROZAC) 40 MG capsule Take 40 mg by mouth daily.   Yes [provider]  furosemide (LASIX) 20 MG tablet Take 1 tablet (20 mg total) by mouth daily. Patient taking differently: Take 40 mg by mouth 2 (two) times daily. Can take extra PRN 11/30/18  Yes Ojie, Jude, MD  gabapentin (NEURONTIN) 100 MG capsule Take 200 mg by mouth 3 (three) times daily.   Yes [provider]  HYDROcodone-acetaminophen (NORCO/VICODIN) 5-325 MG tablet Take 1 tablet by mouth 3 (three) times daily as needed for moderate pain.   Yes [provider]  ipratropium-albuterol (DUONEB) 0.5-2.5 (3) MG/3ML SOLN Take 3 mLs by nebulization every 6 (six) hours as needed (Shortness of breath, wheezing.). 02/10/18  Yes Henreitta Leber, MD  isosorbide mononitrate (IMDUR) 60 MG 24 hr tablet  03/12/20  Yes [provider]  levothyroxine (SYNTHROID, LEVOTHROID) 200 MCG tablet Take 200 mcg by mouth daily before breakfast.   Yes [provider]  losartan (COZAAR) 50 MG tablet Take 50 mg by mouth daily. 10/19/18  Yes [provider]  methocarbamol (ROBAXIN) 500 MG tablet  03/20/20  Yes [provider]  montelukast (SINGULAIR) 10 MG tablet Take 10 mg by mouth Nightly.   Yes [provider]  potassium chloride SA (K-DUR,KLOR-CON) 20 MEQ tablet Take 20 mEq by mouth 2 (two) times daily.    Yes [provider]  simvastatin (ZOCOR) 80 MG tablet Take 80 mg by mouth daily.    Yes [provider]  tiotropium (SPIRIVA HANDIHALER) 18 MCG inhalation capsule Place 1 capsule into inhaler and inhale daily. 12/08/18  Yes  [provider]  tiZANidine (ZANAFLEX) 4 MG tablet  03/12/20  Yes [provider]  traZODone (DESYREL) 100 MG tablet  02/04/20  Yes [provider]  verapamil (CALAN-SR) 180 MG CR tablet Take 180 mg by mouth 2 (two) times daily.    Yes [provider]  Vitamin D, Ergocalciferol, (DRISDOL) 1.25 MG (50000 UNIT) CAPS capsule  02/06/20  Yes [provider]  zolpidem (AMBIEN) 5 MG tablet Take 5 mg by mouth at bedtime as needed for sleep.    [provider]    Allergies as of 03/27/2020 - Review Complete 03/26/2020  Allergen Reaction Noted  . Ibuprofen  12/25/2009    Family History  Problem Relation Age of Onset  . Hypertension Mother   . Anxiety disorder Mother   .  Stroke Mother        "mini strokes"  . Coronary artery disease Mother   . Heart attack Mother   . Emphysema Father   . Heart disease Father   . Scoliosis Father   . Coronary artery disease Father   . GU problems Neg Hx   . Kidney disease Neg Hx   . Prostate cancer Neg Hx     Social History   Socioeconomic History  . Marital status: Unknown    Spouse name: Not on file  . Number of children: 0  . Years of education: 71  . Highest education level: Associate degree: occupational, Hotel manager, or vocational program  Occupational History  . Not on file  Tobacco Use  . Smoking status: Current Every Day Smoker    Packs/day: 0.50    Years: 35.00    Pack years: 17.50  . Smokeless tobacco: Never Used  Substance and Sexual Activity  . Alcohol use: Not Currently    Alcohol/week: 2.0 standard drinks    Types: 1 Glasses of wine, 1 Shots of liquor per week  . Drug use: No  . Sexual activity: Not on file  Other Topics Concern  . Not on file  Social History Narrative  . Not on file   Social Determinants of Health   Financial Resource Strain:   . Difficulty of Paying Living Expenses:   Food Insecurity:   . Worried About Charity fundraiser in the Last Year:   . Youth worker in the Last Year:   Transportation Needs:   . Film/video editor (Medical):   Marland Kitchen Lack of Transportation (Non-Medical):   Physical Activity:   . Days of Exercise per Week:   . Minutes of Exercise per Session:   Stress:   . Feeling of Stress :   Social Connections:   . Frequency of Communication with Friends and Family:   . Frequency of Social Gatherings with Friends and Family:   . Attends Religious Services:   . Active Member of Clubs or Organizations:   . Attends Archivist Meetings:   Marland Kitchen Marital Status:   Intimate Partner Violence:   . Fear of Current or Ex-Partner:   . Emotionally Abused:   Marland Kitchen Physically Abused:   . Sexually Abused:     Review of Systems: See HPI, otherwise negative ROS  Physical Exam: BP (!) 143/81   Pulse 65   Temp (!) 96.8 F (36 C) (Temporal)   Resp 18   Ht 6\' 2"  (1.88 m)   Wt 120.2 kg   BMI 34.02 kg/m  General:   Alert,  pleasant and cooperative in NAD Head:  Normocephalic and atraumatic. Neck:  Supple; no masses or thyromegaly. Lungs:  Clear throughout to auscultation.    Heart:  Regular rate and rhythm. Abdomen:  Soft, nontender and nondistended. Normal bowel sounds, without guarding, and without rebound.   Neurologic:  Alert and  oriented x4;  grossly normal neurologically.  Impression/Plan: [name] [name] is here for an endoscopy and colonoscopy to be performed for dysphagia and history of colon polyps  Risks, benefits, limitations, and alternatives regarding  endoscopy and colonoscopy have been reviewed with the patient.  Questions have been answered.  All parties agreeable.   Lucilla Lame, MD  04/17/2020, 9:34 AM

## 2020-04-17 NOTE — Op Note (Signed)
[facility] Gastroenterology Patient Name: Landrum Daffron Procedure Date: 04/17/2020 10:09 AM MRN: [id number] Account #: [id number] Date of Birth: 17-Mar-1946 Admit Type: Outpatient Age: 74 Room: [facility] ENDO ROOM 4 Gender: Male Note Status: Finalized Procedure:             Colonoscopy Indications:           High risk colon cancer surveillance: Personal history                         of colonic polyps Providers:             Lucilla Lame MD, MD Referring MD:          Leonie Douglas. Doy Hutching, MD (Referring MD) Medicines:             Propofol per Anesthesia Complications:         No immediate complications. Procedure:             Pre-Anesthesia Assessment:                        - Prior to the procedure, a History and Physical was                         performed, and patient medications and allergies were                         reviewed. The patient's tolerance of previous                         anesthesia was also reviewed. The risks and benefits                         of the procedure and the sedation options and risks                         were discussed with the patient. All questions were                         answered, and informed consent was obtained. Prior                         Anticoagulants: The patient has taken no previous                         anticoagulant or antiplatelet agents. ASA Grade                         Assessment: III - A patient with severe systemic                         disease. After reviewing the risks and benefits, the                         patient was deemed in satisfactory condition to                         undergo the procedure.  After obtaining informed consent, the colonoscope was                         passed under direct vision. Throughout the procedure,                         the patient's blood pressure, pulse, and oxygen                         saturations were monitored continuously. The                 Colonoscope was introduced through the anus and                         advanced to the the cecum, identified by appendiceal                         orifice and ileocecal valve. The colonoscopy was                         performed without difficulty. The patient tolerated                         the procedure well. The quality of the bowel                         preparation was poor. Findings:      The perianal and digital rectal examinations were normal.      A 7 mm polyp was found in the sigmoid colon. The polyp was pedunculated.       The polyp was removed with a cold snare. Resection and retrieval were       complete.      A 3 mm polyp was found in the ascending colon. The polyp was sessile.       The polyp was removed with a cold biopsy forceps. Resection and       retrieval were complete.      A 5 mm polyp was found in the ascending colon. The polyp was sessile.       The polyp was removed with a cold snare. Resection and retrieval were       complete.      A 3 mm polyp was found in the cecum. The polyp was sessile. The polyp       was removed with a cold biopsy forceps. Resection and retrieval were       complete.      A 5 mm polyp was found in the descending colon. The polyp was sessile.       The polyp was removed with a cold snare. Resection was complete, but the       polyp tissue was not retrieved.      A few small-mouthed diverticula were found in the sigmoid colon.      Non-bleeding internal hemorrhoids were found during retroflexion. The       hemorrhoids were Grade I (internal hemorrhoids that do not prolapse). Impression:            - Preparation of the colon was poor.                        -  One 7 mm polyp in the sigmoid colon, removed with a                         cold snare. Resected and retrieved.                        - One 3 mm polyp in the ascending colon, removed with                         a cold biopsy forceps. Resected and retrieved.                         - One 5 mm polyp in the ascending colon, removed with                         a cold snare. Resected and retrieved.                        - One 3 mm polyp in the cecum, removed with a cold                         biopsy forceps. Resected and retrieved.                        - One 5 mm polyp in the descending colon, removed with                         a cold snare. Complete resection. Polyp tissue not                         retrieved.                        - Diverticulosis in the sigmoid colon.                        - Non-bleeding internal hemorrhoids. Recommendation:        - Discharge patient to home.                        - Resume previous diet.                        - Continue present medications.                        - Await pathology results. Procedure Code(s):     --- Professional ---                        [phone], Colonoscopy, flexible; with removal of                         tumor(s), polyp(s), or other lesion(s) by snare                         technique                        [id number], 59, Colonoscopy, flexible; with biopsy, single  or multiple Diagnosis Code(s):     --- Professional ---                        Z86.010, Personal history of colonic polyps                        K63.5, Polyp of colon CPT copyright 2019 American Medical Association. All rights reserved. The codes documented in this report are preliminary and upon coder review may  be revised to meet current compliance requirements. Lucilla Lame MD, MD 04/17/2020 11:00:25 AM This report has been signed electronically. Number of Addenda: 0 Note Initiated On: 04/17/2020 10:09 AM Scope Withdrawal Time: 0 hours 11 minutes 6 seconds  Total Procedure Duration: 0 hours 25 minutes 26 seconds  Estimated Blood Loss:  Estimated blood loss: none.      [facility]

## 2020-04-17 NOTE — Transfer of Care (Signed)
Immediate Anesthesia Transfer of Care Note  Patient: [name] [name]  Procedure(s) Performed: COLONOSCOPY WITH PROPOFOL (N/A ) ESOPHAGOGASTRODUODENOSCOPY (EGD) WITH PROPOFOL (N/A )  Patient Location: PACU  Anesthesia Type:General  Level of Consciousness: awake, alert  and oriented  Airway & Oxygen Therapy: Patient Spontanous Breathing  Post-op Assessment: Report given to RN and Post -op Vital signs reviewed and stable  Post vital signs: Reviewed and stable  Last Vitals:  Vitals Value Taken Time  BP 126/60 04/17/20 1103  Temp 36 C 04/17/20 1100  Pulse 65 04/17/20 1103  Resp 16 04/17/20 1103  SpO2 97 % 04/17/20 1103  Vitals shown include unvalidated device data.  Last Pain:  Vitals:   04/17/20 1100  TempSrc: Temporal  PainSc:          Complications: No apparent anesthesia complications

## 2020-04-17 NOTE — Anesthesia Preprocedure Evaluation (Signed)
Anesthesia Evaluation  Patient identified by MRN, date of birth, ID band Patient awake    Reviewed: Allergy & Precautions, NPO status , Patient's Chart, lab work & pertinent test results  History of Anesthesia Complications Negative for: history of anesthetic complications  Airway Mallampati: II  TM Distance: >3 FB Neck ROM: Full    Dental no notable dental hx. (+) Teeth Intact   Pulmonary sleep apnea , COPD,  COPD inhaler, Current Smoker and Patient abstained from smoking.,     + decreased breath sounds      Cardiovascular Exercise Tolerance: Good METShypertension, (-) CAD and (-) Past MI (-) dysrhythmias  Rhythm:Regular Rate:Normal - Systolic murmurs Study Conclusions   - Left ventricle: Systolic function was normal. The estimated  ejection fraction was in the range of 55% to 60%.  - Right ventricle: The cavity size was mildly dilated.    Neuro/Psych PSYCHIATRIC DISORDERS Anxiety Depression negative neurological ROS     GI/Hepatic neg GERD  ,(+)     (-) substance abuse  ,   Endo/Other  neg diabetesHypothyroidism   Renal/GU negative Renal ROS     Musculoskeletal   Abdominal   Peds  Hematology   Anesthesia Other Findings Past Medical History: No date: Anxiety 05/05/2014: Anxiety disorder     Comment:  unspecifed No date: Cancer [facility])     Comment:  prostate cancer  No date: CHF (congestive heart failure) ([facility]) No date: Colonic polyp No date: COPD (chronic obstructive pulmonary disease) ([facility]) No date: Depression No date: Dyspnea No date: Elevated prostate specific antigen (PSA) 05/05/2014: Essential (primary) hypertension No date: GI bleed     Comment:  due to diverticulitis  No date: Heart murmur     Comment:  hx of 1960s 05/05/2014: History of thyroid cancer No date: Hypercholesteremia No date: Hyperlipidemia No date: Hypertension 05/05/2014: Hypothyroidism No date: Impotence of organic  origin 12/29/2012: Male erectile dysfunction No date: Malignant neoplasm of prostate [facility])     Comment:  10/30/2011 T1c - Identified by needle biopsy . Gleason 7              (3+4) 5 of 12 cores, Bilateral  06/22/2017: Prediabetes No date: Prostate cancer ([place]) 07/11/2015: Shortness of breath on exertion No date: Sleep apnea     Comment:  CPAP , report on chart , not used in last 2 weeks  No date: Thyroid cancer ([place]) No date: Thyroid disease No date: Urinary obstruction     Comment:  not elsewhere classified  Reproductive/Obstetrics                             Anesthesia Physical Anesthesia Plan  ASA: III  Anesthesia Plan: General   Post-op Pain Management:    Induction: Intravenous  PONV Risk Score and Plan: 1 and Ondansetron, Propofol infusion and TIVA  Airway Management Planned: Nasal Cannula  Additional Equipment: None  Intra-op Plan:   Post-operative Plan:   Informed Consent: I have reviewed the patients History and Physical, chart, labs and discussed the procedure including the risks, benefits and alternatives for the proposed anesthesia with the patient or authorized representative who has indicated his/her understanding and acceptance.     Dental advisory given  Plan Discussed with: CRNA and Surgeon  Anesthesia Plan Comments: (Discussed risks of anesthesia with patient, including possibility of difficulty with spontaneous ventilation under anesthesia necessitating airway intervention, PONV, and rare risks such as cardiac or respiratory or neurological events. Patient understands.)  Anesthesia Quick Evaluation  

## 2020-04-17 NOTE — Op Note (Signed)
[facility] Gastroenterology Patient Name: [name] [name] Procedure Date: 04/17/2020 10:10 AM MRN: [id number] Account #: [id number] Date of Birth: May 01, 1946 Admit Type: Outpatient Age: 74 Room: [facility] ENDO ROOM 4 Gender: Male Note Status: Finalized Procedure:             Upper GI endoscopy Indications:           Dysphagia Providers:             Lucilla Lame MD, MD Referring MD:          Leonie Douglas. Doy Hutching, MD (Referring MD) Medicines:             Propofol per Anesthesia Complications:         No immediate complications. Procedure:             Pre-Anesthesia Assessment:                        - Prior to the procedure, a History and Physical was                         performed, and patient medications and allergies were                         reviewed. The patient's tolerance of previous                         anesthesia was also reviewed. The risks and benefits                         of the procedure and the sedation options and risks                         were discussed with the patient. All questions were                         answered, and informed consent was obtained. Prior                         Anticoagulants: The patient has taken no previous                         anticoagulant or antiplatelet agents. ASA Grade                         Assessment: III - A patient with severe systemic                         disease. After reviewing the risks and benefits, the                         patient was deemed in satisfactory condition to                         undergo the procedure.                        After obtaining informed consent, the endoscope was  passed under direct vision. Throughout the procedure,                         the patient's blood pressure, pulse, and oxygen                         saturations were monitored continuously. The Endoscope                         was introduced through the mouth, and advanced to the                        second part of duodenum. The upper GI endoscopy was                         accomplished without difficulty. The patient tolerated                         the procedure well. Findings:      One benign-appearing, intrinsic mild stenosis was found at the       gastroesophageal junction. The stenosis was traversed. A TTS dilator was       passed through the scope. Dilation with a 15-16.5-18 mm balloon dilator       was performed to 18 mm. The dilation site was examined following       endoscope reinsertion and showed complete resolution of luminal       narrowing.      Patchy, Stankowski plaques were found in the entire esophagus. Cells for       cytology were obtained by brushing.      The stomach was normal.      The examined duodenum was normal. Impression:            - Benign-appearing esophageal stenosis. Dilated.                        - Esophageal plaques were found, suspicious for                         candidiasis. Cells for cytology obtained.                        - Normal stomach.                        - Normal examined duodenum. Recommendation:        - Discharge patient to home.                        - Resume previous diet.                        - Continue present medications.                        - Perform a colonoscopy today. Procedure Code(s):     --- Professional ---                        [phone], Esophagogastroduodenoscopy, flexible,  transoral; with transendoscopic balloon dilation of                         esophagus (less than 30 mm diameter) Diagnosis Code(s):     --- Professional ---                        R13.10, Dysphagia, unspecified                        K22.2, Esophageal obstruction CPT copyright 2019 American Medical Association. All rights reserved. The codes documented in this report are preliminary and upon coder review may  be revised to meet current compliance requirements. Lucilla Lame MD, MD 04/17/2020  10:30:23 AM This report has been signed electronically. Number of Addenda: 0 Note Initiated On: 04/17/2020 10:10 AM Estimated Blood Loss:  Estimated blood loss: none.      [facility]

## 2020-04-18 ENCOUNTER — Encounter: Payer: Self-pay | Admitting: *Deleted

## 2020-04-18 LAB — SURGICAL PATHOLOGY

## 2020-04-19 ENCOUNTER — Encounter: Payer: Self-pay | Admitting: Gastroenterology

## 2020-05-03 ENCOUNTER — Telehealth: Payer: Self-pay

## 2020-05-03 ENCOUNTER — Other Ambulatory Visit: Payer: Self-pay

## 2020-05-03 MED ORDER — LINACLOTIDE 290 MCG PO CAPS: 290.0000 ug | ORAL_CAPSULE | Freq: Every day | ORAL | 0 refills | Status: DC

## 2020-05-03 NOTE — Telephone Encounter (Signed)
Pt notified ok 'd per Dr. Allen Norris to send Linzess 290 mcg to [employer]. Advised pt I am only sending 30 days for him to try. If this works well, we will send additional refills. Advised pt to stop if he develops diarrhea and contact the office.

## 2020-05-03 NOTE — Telephone Encounter (Signed)
-----   Message from Lucilla Lame, MD sent at 05/03/2020 10:21 AM EDT ----- Regarding: RE: Please start him on linzess 234micrograms. ----- Message ----- From: Glennie [place], CMA Sent: 05/03/2020  10:19 AM EDT To: Lucilla Lame, MD  Pt called stating he hasn't been feeling well since his procedure. He is having abdominal pain and constipation. He has not been taking miralax. He wanted to know if you could call something in for the constipation

## 2020-08-03 ENCOUNTER — Other Ambulatory Visit: Payer: Self-pay | Admitting: Neurology

## 2020-08-03 DIAGNOSIS — M545 Low back pain, unspecified: Secondary | ICD-10-CM

## 2020-08-08 ENCOUNTER — Other Ambulatory Visit: Payer: Self-pay | Admitting: Neurology

## 2020-08-08 DIAGNOSIS — R296 Repeated falls: Secondary | ICD-10-CM

## 2020-08-09 ENCOUNTER — Ambulatory Visit: Payer: Medicare Other

## 2020-08-20 ENCOUNTER — Ambulatory Visit: Payer: Medicare Other

## 2020-08-20 ENCOUNTER — Ambulatory Visit: Admission: RE | Admit: 2020-08-20 | Payer: Medicare Other | Source: Ambulatory Visit

## 2020-09-22 ENCOUNTER — Other Ambulatory Visit: Payer: Self-pay

## 2020-09-22 ENCOUNTER — Ambulatory Visit
Admission: RE | Admit: 2020-09-22 | Discharge: 2020-09-22 | Disposition: A | Discharge status: Home / Self Care | Payer: Medicare Other | Source: Ambulatory Visit | Attending: Neurology | Admitting: Neurology

## 2020-09-22 DIAGNOSIS — M545 Low back pain, unspecified: Secondary | ICD-10-CM | POA: Diagnosis present

## 2020-09-22 DIAGNOSIS — G8929 Other chronic pain: Secondary | ICD-10-CM | POA: Diagnosis present

## 2020-09-22 DIAGNOSIS — R296 Repeated falls: Secondary | ICD-10-CM | POA: Diagnosis present

## 2020-10-02 ENCOUNTER — Other Ambulatory Visit: Payer: Self-pay | Admitting: Neurosurgery

## 2020-10-15 NOTE — Pre-Procedure Instructions (Signed)
[place], [place] - [place] [address] [place] [address] Phone: [phone] Fax: [phone]     Your procedure is scheduled on Friday 10/19/20 from 11:30 AM- 2:28 PM.  Report to [facility] Main Entrance "A" at 09:30 A.M., and check in at the Admitting office.  Call this number if you have problems the morning of surgery:  [phone]  Call [phone] if you have any questions prior to your surgery date Monday-Friday 8am-4pm.    Remember:  Do not eat or drink after midnight the night before your surgery.    Take these medicines the morning of surgery with A SIP OF WATER:  FLUoxetine (PROZAC)  gabapentin (NEURONTIN) isosorbide mononitrate (IMDUR)  levothyroxine (SYNTHROID)  omeprazole (PRILOSEC) simvastatin (ZOCOR) budesonide-formoterol (SYMBICORT) inhaler tiotropium (SPIRIVA HANDIHALER) inhaler  IF NEEDED: diazepam (VALIUM) HYDROcodone-acetaminophen (NORCO/VICODIN) oxyCODONE (OXY IR/ROXICODONE) tiZANidine (ZANAFLEX) albuterol (VENTOLIN HFA) inhaler (bring with you the day of surgery)   WHAT DO I DO ABOUT MY DIABETES MEDICATION? Marland Kitchen Do not take oral diabetes medicines (pills) such as metFORMIN (GLUCOPHAGE)  the morning of surgery.   As of today, STOP taking any Aspirin (unless otherwise instructed by your surgeon) Aleve, Naproxen, Ibuprofen, Motrin, Advil, Goody's, BC's, all herbal medications, fish oil, and all vitamins.    HOW TO MANAGE YOUR DIABETES BEFORE AND AFTER SURGERY  Why is it important to control my blood sugar before and after surgery? . Improving blood sugar levels before and after surgery helps healing and can limit problems. . A way of improving blood sugar control is eating a healthy diet by: o  Eating less sugar and carbohydrates o  Increasing activity/exercise o  Talking with your doctor about reaching your blood sugar goals . High blood sugars (greater than 180 mg/dL) can raise your risk of infections and slow  your recovery, so you will need to focus on controlling your diabetes during the weeks before surgery. . Make sure that the doctor who takes care of your diabetes knows about your planned surgery including the date and location.  How do I manage my blood sugar before surgery? . Check your blood sugar at least 4 times a day, starting 2 days before surgery, to make sure that the level is not too high or low. . Check your blood sugar the morning of your surgery when you wake up and every 2 hours until you get to the Short Stay unit. o If your blood sugar is less than 70 mg/dL, you will need to treat for low blood sugar: - Treat a low blood sugar (less than 70 mg/dL) with  cup of clear juice (cranberry or apple), 4 glucose tablets, OR glucose gel. - Recheck blood sugar in 15 minutes after treatment (to make sure it is greater than 70 mg/dL). If your blood sugar is not greater than 70 mg/dL on recheck, call [phone] for further instructions. . Report your blood sugar to the short stay nurse when you get to Short Stay.  . If you are admitted to the hospital after surgery: o Your blood sugar will be checked by the staff and you will probably be given insulin after surgery (instead of oral diabetes medicines) to make sure you have good blood sugar levels. o The goal for blood sugar control after surgery is 80-180 mg/dL.           The Morning of Surgery:             Do  not wear jewelry.            Do not wear lotions, powders, colognes, or deodorant.            Men may shave face and neck.            Do not bring valuables to the hospital.            [facility] is not responsible for any belongings or valuables.  Do NOT Smoke (Tobacco/Vaping) or drink Alcohol 24 hours prior to your procedure.  If you use a CPAP at night, you may bring all equipment for your overnight stay.   Contacts, glasses, dentures or bridgework may not be worn into surgery.      For patients admitted to the hospital,  discharge time will be determined by your treatment team.   Patients discharged the day of surgery will not be allowed to drive home, and someone needs to stay with them for 24 hours.    Special instructions:   [facility]- Preparing For Surgery  Before surgery, you can play an important role. Because skin is not sterile, your skin needs to be as free of germs as possible. You can reduce the number of germs on your skin by washing with CHG (chlorahexidine gluconate) Soap before surgery.  CHG is an antiseptic cleaner which kills germs and bonds with the skin to continue killing germs even after washing.    Oral Hygiene is also important to reduce your risk of infection.  Remember - BRUSH YOUR TEETH THE MORNING OF SURGERY WITH YOUR REGULAR TOOTHPASTE  Please do not use if you have an allergy to CHG or antibacterial soaps. If your skin becomes reddened/irritated stop using the CHG.  Do not shave (including legs and underarms) for at least 48 hours prior to first CHG shower. It is OK to shave your face.  Please follow these instructions carefully.   1. Shower the NIGHT BEFORE SURGERY and the MORNING OF SURGERY with CHG Soap.   2. If you chose to wash your hair, wash your hair first as usual with your normal shampoo.  3. After you shampoo, rinse your hair and body thoroughly to remove the shampoo.  4. Use CHG as you would any other liquid soap. You can apply CHG directly to the skin and wash gently with a scrungie or a clean washcloth.   5. Apply the CHG Soap to your body ONLY FROM THE NECK DOWN.  Do not use on open wounds or open sores. Avoid contact with your eyes, ears, mouth and genitals (private parts). Wash Face and genitals (private parts)  with your normal soap.   6. Wash thoroughly, paying special attention to the area where your surgery will be performed.  7. Thoroughly rinse your body with warm water from the neck down.  8. DO NOT shower/wash with your normal soap after using  and rinsing off the CHG Soap.  9. Pat yourself dry with a CLEAN TOWEL.  10. Wear CLEAN PAJAMAS to bed the night before surgery  11. Place CLEAN SHEETS on your bed the night of your first shower and DO NOT SLEEP WITH PETS.   Day of Surgery: SHOWER Wear Clean/Comfortable clothing the morning of surgery Do not apply any deodorants/lotions.   Remember to brush your teeth WITH YOUR REGULAR TOOTHPASTE.   Please read over the following fact sheets that you were given.

## 2020-10-16 ENCOUNTER — Other Ambulatory Visit: Payer: Self-pay

## 2020-10-16 ENCOUNTER — Other Ambulatory Visit (HOSPITAL_COMMUNITY)
Admission: RE | Admit: 2020-10-16 | Discharge: 2020-10-16 | Disposition: A | Discharge status: Home / Self Care | Payer: Medicare Other | Source: Ambulatory Visit | Attending: Neurosurgery | Admitting: Neurosurgery

## 2020-10-16 ENCOUNTER — Encounter (HOSPITAL_COMMUNITY): Payer: Self-pay

## 2020-10-16 ENCOUNTER — Encounter (HOSPITAL_COMMUNITY)
Admission: RE | Admit: 2020-10-16 | Discharge: 2020-10-16 | Disposition: A | Discharge status: Home / Self Care | Payer: Medicare Other | Source: Ambulatory Visit | Attending: Neurosurgery | Admitting: Neurosurgery

## 2020-10-16 DIAGNOSIS — Z01818 Encounter for other preprocedural examination: Secondary | ICD-10-CM | POA: Insufficient documentation

## 2020-10-16 DIAGNOSIS — Z01812 Encounter for preprocedural laboratory examination: Secondary | ICD-10-CM | POA: Insufficient documentation

## 2020-10-16 DIAGNOSIS — E119 Type 2 diabetes mellitus without complications: Secondary | ICD-10-CM | POA: Insufficient documentation

## 2020-10-16 DIAGNOSIS — I10 Essential (primary) hypertension: Secondary | ICD-10-CM | POA: Diagnosis not present

## 2020-10-16 DIAGNOSIS — Z20822 Contact with and (suspected) exposure to covid-19: Secondary | ICD-10-CM | POA: Insufficient documentation

## 2020-10-16 LAB — SURGICAL PCR SCREEN
MRSA, PCR: NEGATIVE
Staphylococcus aureus: NEGATIVE

## 2020-10-16 LAB — SARS CORONAVIRUS 2 (TAT 6-24 HRS): SARS Coronavirus 2: NEGATIVE

## 2020-10-16 NOTE — Progress Notes (Signed)
PCP - [facility] Cardiologist - Dr. Montez Hageman   EKG - 10/16/20  Stress Test - "4-5 years ago"- records requested ECHO - 09/28/19 Cardiac Cath - "4-5 years ago"- records requested  Sleep Study - h/o OSA CPAP - uses QHS- does not know pressure settings  Fasting Blood Sugar - pt states pre-diabetic, does not check CBG  Aspirin Instructions: Patient instructed to hold all Aspirin, NSAID's, herbal medications, fish oil and vitamins 7 days prior to surgery.    COVID TEST-  10/16/20 Patient instructed to hold all Aspirin, NSAID's, herbal medications, fish oil and vitamins 7 days prior to surgery.    Anesthesia review: cardiac history  Patient denies shortness of breath, fever, cough and chest pain at PAT appointment   All instructions explained to the patient, with a verbal understanding of the material. Patient agrees to go over the instructions while at home for a better understanding. Patient also instructed to self quarantine after being tested for COVID-19. The opportunity to ask questions was provided.

## 2020-10-16 NOTE — Pre-Procedure Instructions (Signed)
[place], [place] - [address] [place] [address] Phone: [phone] Fax: [phone]     Your procedure is scheduled on Friday 10/19/20   Report to [facility] Main Entrance "A" at 09:30 A.M., and check in at the Admitting office.  Call this number if you have problems the morning of surgery:  [phone]  Call [phone] if you have any questions prior to your surgery date Monday-Friday 8am-4pm.    Remember:  Do not eat or drink after midnight the night before your surgery.    Take these medicines the morning of surgery with A SIP OF WATER:  FLUoxetine (PROZAC)  gabapentin (NEURONTIN) isosorbide mononitrate (IMDUR)  levothyroxine (SYNTHROID)  omeprazole (PRILOSEC) simvastatin (ZOCOR) budesonide-formoterol (SYMBICORT) inhaler tiotropium (SPIRIVA HANDIHALER) inhaler  IF NEEDED: diazepam (VALIUM) HYDROcodone-acetaminophen (NORCO/VICODIN) oxyCODONE (OXY IR/ROXICODONE) tiZANidine (ZANAFLEX) albuterol (VENTOLIN HFA) inhaler (bring with you the day of surgery)   WHAT DO I DO ABOUT MY DIABETES MEDICATION?  Do not take oral diabetes medicines (pills) such as metFORMIN (GLUCOPHAGE)  the morning of surgery.   As of today, STOP taking any Aspirin (unless otherwise instructed by your surgeon) Aleve, Naproxen, Ibuprofen, Motrin, Advil, Goody's, BC's, all herbal medications, fish oil, and all vitamins.    HOW TO MANAGE YOUR DIABETES BEFORE AND AFTER SURGERY  Why is it important to control my blood sugar before and after surgery?  Improving blood sugar levels before and after surgery helps healing and can limit problems.  A way of improving blood sugar control is eating a healthy diet by: o  Eating less sugar and carbohydrates o  Increasing activity/exercise o  Talking with your doctor about reaching your blood sugar goals  High blood sugars (greater than 180 mg/dL) can raise your risk of infections and slow your recovery, so you  will need to focus on controlling your diabetes during the weeks before surgery.  Make sure that the doctor who takes care of your diabetes knows about your planned surgery including the date and location.  How do I manage my blood sugar before surgery?  Check your blood sugar at least 4 times a day, starting 2 days before surgery, to make sure that the level is not too high or low.  Check your blood sugar the morning of your surgery when you wake up and every 2 hours until you get to the Short Stay unit. o If your blood sugar is less than 70 mg/dL, you will need to treat for low blood sugar: - Treat a low blood sugar (less than 70 mg/dL) with  cup of clear juice (cranberry or apple), 4 glucose tablets, OR glucose gel. - Recheck blood sugar in 15 minutes after treatment (to make sure it is greater than 70 mg/dL). If your blood sugar is not greater than 70 mg/dL on recheck, call [phone] for further instructions.  Report your blood sugar to the short stay nurse when you get to Short Stay.   If you are admitted to the hospital after surgery: o Your blood sugar will be checked by the staff and you will probably be given insulin after surgery (instead of oral diabetes medicines) to make sure you have good blood sugar levels. o The goal for blood sugar control after surgery is 80-180 mg/dL.           The Morning of Surgery:             Do not wear jewelry.  Do not wear lotions, powders, colognes, or deodorant.            Men may shave face and neck.            Do not bring valuables to the hospital.            [facility] is not responsible for any belongings or valuables.  Do NOT Smoke (Tobacco/Vaping) or drink Alcohol 24 hours prior to your procedure.  If you use a CPAP at night, you may bring all equipment for your overnight stay.   Contacts, glasses, dentures or bridgework may not be worn into surgery.      For patients admitted to the hospital, discharge time will be  determined by your treatment team.   Patients discharged the day of surgery will not be allowed to drive home, and someone needs to stay with them for 24 hours.    Special instructions:   [facility]- Preparing For Surgery  Before surgery, you can play an important role. Because skin is not sterile, your skin needs to be as free of germs as possible. You can reduce the number of germs on your skin by washing with CHG (chlorahexidine gluconate) Soap before surgery.  CHG is an antiseptic cleaner which kills germs and bonds with the skin to continue killing germs even after washing.    Oral Hygiene is also important to reduce your risk of infection.  Remember - BRUSH YOUR TEETH THE MORNING OF SURGERY WITH YOUR REGULAR TOOTHPASTE  Please do not use if you have an allergy to CHG or antibacterial soaps. If your skin becomes reddened/irritated stop using the CHG.  Do not shave (including legs and underarms) for at least 48 hours prior to first CHG shower. It is OK to shave your face.  Please follow these instructions carefully.   1. Shower the NIGHT BEFORE SURGERY and the MORNING OF SURGERY with CHG Soap.   2. If you chose to wash your hair, wash your hair first as usual with your normal shampoo.  3. After you shampoo, rinse your hair and body thoroughly to remove the shampoo.  4. Use CHG as you would any other liquid soap. You can apply CHG directly to the skin and wash gently with a scrungie or a clean washcloth.   5. Apply the CHG Soap to your body ONLY FROM THE NECK DOWN.  Do not use on open wounds or open sores. Avoid contact with your eyes, ears, mouth and genitals (private parts). Wash Face and genitals (private parts)  with your normal soap.   6. Wash thoroughly, paying special attention to the area where your surgery will be performed.  7. Thoroughly rinse your body with warm water from the neck down.  8. DO NOT shower/wash with your normal soap after using and rinsing off the CHG  Soap.  9. Pat yourself dry with a CLEAN TOWEL.  10. Wear CLEAN PAJAMAS to bed the night before surgery  11. Place CLEAN SHEETS on your bed the night of your first shower and DO NOT SLEEP WITH PETS.   Day of Surgery: SHOWER Wear Clean/Comfortable clothing the morning of surgery Do not apply any deodorants/lotions.   Remember to brush your teeth WITH YOUR REGULAR TOOTHPASTE.   Please read over the following fact sheets that you were given.

## 2020-10-19 ENCOUNTER — Ambulatory Visit (HOSPITAL_COMMUNITY): Admission: RE | Admit: 2020-10-19 | Payer: Medicare Other | Source: Home / Self Care | Admitting: Neurosurgery

## 2020-10-19 ENCOUNTER — Encounter (HOSPITAL_COMMUNITY): Admission: RE | Payer: Self-pay | Source: Home / Self Care

## 2020-10-19 SURGERY — ANTERIOR CERVICAL DECOMPRESSION/DISCECTOMY FUSION 2 LEVEL/HARDWARE REMOVAL
Anesthesia: General

## 2020-10-26 ENCOUNTER — Inpatient Hospital Stay (HOSPITAL_COMMUNITY): Payer: Medicare Other

## 2020-10-26 ENCOUNTER — Inpatient Hospital Stay (HOSPITAL_COMMUNITY)
Admission: AD | Admit: 2020-10-26 | Discharge: 2020-11-29 | DRG: 515 | Disposition: A | Discharge status: Skilled Nursing Facility | Payer: Medicare Other | Source: Ambulatory Visit | Attending: Neurosurgery | Admitting: Neurosurgery

## 2020-10-26 DIAGNOSIS — N179 Acute kidney failure, unspecified: Secondary | ICD-10-CM

## 2020-10-26 DIAGNOSIS — J189 Pneumonia, unspecified organism: Secondary | ICD-10-CM | POA: Diagnosis not present

## 2020-10-26 DIAGNOSIS — J95821 Acute postprocedural respiratory failure: Secondary | ICD-10-CM | POA: Diagnosis not present

## 2020-10-26 DIAGNOSIS — G4733 Obstructive sleep apnea (adult) (pediatric): Secondary | ICD-10-CM | POA: Diagnosis present

## 2020-10-26 DIAGNOSIS — F419 Anxiety disorder, unspecified: Secondary | ICD-10-CM | POA: Diagnosis present

## 2020-10-26 DIAGNOSIS — J690 Pneumonitis due to inhalation of food and vomit: Secondary | ICD-10-CM | POA: Diagnosis not present

## 2020-10-26 DIAGNOSIS — J9600 Acute respiratory failure, unspecified whether with hypoxia or hypercapnia: Secondary | ICD-10-CM

## 2020-10-26 DIAGNOSIS — Z9841 Cataract extraction status, right eye: Secondary | ICD-10-CM

## 2020-10-26 DIAGNOSIS — G8929 Other chronic pain: Secondary | ICD-10-CM | POA: Diagnosis present

## 2020-10-26 DIAGNOSIS — R1319 Other dysphagia: Secondary | ICD-10-CM | POA: Diagnosis not present

## 2020-10-26 DIAGNOSIS — Z66 Do not resuscitate: Secondary | ICD-10-CM | POA: Diagnosis present

## 2020-10-26 DIAGNOSIS — F329 Major depressive disorder, single episode, unspecified: Secondary | ICD-10-CM | POA: Diagnosis present

## 2020-10-26 DIAGNOSIS — M5412 Radiculopathy, cervical region: Secondary | ICD-10-CM | POA: Diagnosis present

## 2020-10-26 DIAGNOSIS — Z0189 Encounter for other specified special examinations: Secondary | ICD-10-CM

## 2020-10-26 DIAGNOSIS — E782 Mixed hyperlipidemia: Secondary | ICD-10-CM | POA: Diagnosis present

## 2020-10-26 DIAGNOSIS — J440 Chronic obstructive pulmonary disease with acute lower respiratory infection: Secondary | ICD-10-CM | POA: Diagnosis present

## 2020-10-26 DIAGNOSIS — Z79899 Other long term (current) drug therapy: Secondary | ICD-10-CM

## 2020-10-26 DIAGNOSIS — E662 Morbid (severe) obesity with alveolar hypoventilation: Secondary | ICD-10-CM | POA: Diagnosis not present

## 2020-10-26 DIAGNOSIS — Z823 Family history of stroke: Secondary | ICD-10-CM

## 2020-10-26 DIAGNOSIS — E876 Hypokalemia: Secondary | ICD-10-CM | POA: Diagnosis not present

## 2020-10-26 DIAGNOSIS — L89816 Pressure-induced deep tissue damage of head: Secondary | ICD-10-CM | POA: Diagnosis present

## 2020-10-26 DIAGNOSIS — M4802 Spinal stenosis, cervical region: Secondary | ICD-10-CM | POA: Diagnosis present

## 2020-10-26 DIAGNOSIS — G4700 Insomnia, unspecified: Secondary | ICD-10-CM | POA: Diagnosis present

## 2020-10-26 DIAGNOSIS — Z7982 Long term (current) use of aspirin: Secondary | ICD-10-CM

## 2020-10-26 DIAGNOSIS — Z79890 Hormone replacement therapy (postmenopausal): Secondary | ICD-10-CM

## 2020-10-26 DIAGNOSIS — E7800 Pure hypercholesterolemia, unspecified: Secondary | ICD-10-CM | POA: Diagnosis present

## 2020-10-26 DIAGNOSIS — B3789 Other sites of candidiasis: Secondary | ICD-10-CM | POA: Diagnosis present

## 2020-10-26 DIAGNOSIS — E871 Hypo-osmolality and hyponatremia: Secondary | ICD-10-CM | POA: Diagnosis not present

## 2020-10-26 DIAGNOSIS — Z825 Family history of asthma and other chronic lower respiratory diseases: Secondary | ICD-10-CM

## 2020-10-26 DIAGNOSIS — E440 Moderate protein-calorie malnutrition: Secondary | ICD-10-CM | POA: Diagnosis not present

## 2020-10-26 DIAGNOSIS — Z20822 Contact with and (suspected) exposure to covid-19: Secondary | ICD-10-CM | POA: Diagnosis not present

## 2020-10-26 DIAGNOSIS — J9811 Atelectasis: Secondary | ICD-10-CM | POA: Diagnosis not present

## 2020-10-26 DIAGNOSIS — R208 Other disturbances of skin sensation: Secondary | ICD-10-CM | POA: Diagnosis not present

## 2020-10-26 DIAGNOSIS — R0603 Acute respiratory distress: Secondary | ICD-10-CM

## 2020-10-26 DIAGNOSIS — Z8249 Family history of ischemic heart disease and other diseases of the circulatory system: Secondary | ICD-10-CM

## 2020-10-26 DIAGNOSIS — J441 Chronic obstructive pulmonary disease with (acute) exacerbation: Secondary | ICD-10-CM | POA: Diagnosis present

## 2020-10-26 DIAGNOSIS — R799 Abnormal finding of blood chemistry, unspecified: Secondary | ICD-10-CM

## 2020-10-26 DIAGNOSIS — J9622 Acute and chronic respiratory failure with hypercapnia: Secondary | ICD-10-CM | POA: Diagnosis not present

## 2020-10-26 DIAGNOSIS — J9601 Acute respiratory failure with hypoxia: Secondary | ICD-10-CM | POA: Diagnosis present

## 2020-10-26 DIAGNOSIS — M4722 Other spondylosis with radiculopathy, cervical region: Secondary | ICD-10-CM | POA: Diagnosis not present

## 2020-10-26 DIAGNOSIS — Z419 Encounter for procedure for purposes other than remedying health state, unspecified: Secondary | ICD-10-CM

## 2020-10-26 DIAGNOSIS — I10 Essential (primary) hypertension: Secondary | ICD-10-CM | POA: Diagnosis present

## 2020-10-26 DIAGNOSIS — R0902 Hypoxemia: Secondary | ICD-10-CM | POA: Diagnosis not present

## 2020-10-26 DIAGNOSIS — Y95 Nosocomial condition: Secondary | ICD-10-CM | POA: Diagnosis present

## 2020-10-26 DIAGNOSIS — Z8546 Personal history of malignant neoplasm of prostate: Secondary | ICD-10-CM

## 2020-10-26 DIAGNOSIS — R0602 Shortness of breath: Secondary | ICD-10-CM

## 2020-10-26 DIAGNOSIS — J9691 Respiratory failure, unspecified with hypoxia: Secondary | ICD-10-CM

## 2020-10-26 DIAGNOSIS — G9340 Encephalopathy, unspecified: Secondary | ICD-10-CM | POA: Diagnosis not present

## 2020-10-26 DIAGNOSIS — J90 Pleural effusion, not elsewhere classified: Secondary | ICD-10-CM | POA: Diagnosis present

## 2020-10-26 DIAGNOSIS — Z9079 Acquired absence of other genital organ(s): Secondary | ICD-10-CM

## 2020-10-26 DIAGNOSIS — E785 Hyperlipidemia, unspecified: Secondary | ICD-10-CM | POA: Diagnosis present

## 2020-10-26 DIAGNOSIS — F05 Delirium due to known physiological condition: Secondary | ICD-10-CM | POA: Diagnosis not present

## 2020-10-26 DIAGNOSIS — Z961 Presence of intraocular lens: Secondary | ICD-10-CM | POA: Diagnosis present

## 2020-10-26 DIAGNOSIS — I110 Hypertensive heart disease with heart failure: Secondary | ICD-10-CM | POA: Diagnosis present

## 2020-10-26 DIAGNOSIS — Z818 Family history of other mental and behavioral disorders: Secondary | ICD-10-CM

## 2020-10-26 DIAGNOSIS — Z9842 Cataract extraction status, left eye: Secondary | ICD-10-CM

## 2020-10-26 DIAGNOSIS — E1142 Type 2 diabetes mellitus with diabetic polyneuropathy: Secondary | ICD-10-CM | POA: Diagnosis present

## 2020-10-26 DIAGNOSIS — Z7951 Long term (current) use of inhaled steroids: Secondary | ICD-10-CM

## 2020-10-26 DIAGNOSIS — J9602 Acute respiratory failure with hypercapnia: Secondary | ICD-10-CM | POA: Diagnosis not present

## 2020-10-26 DIAGNOSIS — D649 Anemia, unspecified: Secondary | ICD-10-CM | POA: Diagnosis present

## 2020-10-26 DIAGNOSIS — T17500A Unspecified foreign body in bronchus causing asphyxiation, initial encounter: Secondary | ICD-10-CM | POA: Diagnosis not present

## 2020-10-26 DIAGNOSIS — E870 Hyperosmolality and hypernatremia: Secondary | ICD-10-CM | POA: Diagnosis not present

## 2020-10-26 DIAGNOSIS — L8990 Pressure ulcer of unspecified site, unspecified stage: Secondary | ICD-10-CM | POA: Insufficient documentation

## 2020-10-26 DIAGNOSIS — K224 Dyskinesia of esophagus: Secondary | ICD-10-CM | POA: Diagnosis not present

## 2020-10-26 DIAGNOSIS — F17210 Nicotine dependence, cigarettes, uncomplicated: Secondary | ICD-10-CM | POA: Diagnosis present

## 2020-10-26 DIAGNOSIS — R1314 Dysphagia, pharyngoesophageal phase: Secondary | ICD-10-CM | POA: Diagnosis not present

## 2020-10-26 DIAGNOSIS — I5032 Chronic diastolic (congestive) heart failure: Secondary | ICD-10-CM | POA: Diagnosis present

## 2020-10-26 DIAGNOSIS — E039 Hypothyroidism, unspecified: Secondary | ICD-10-CM | POA: Diagnosis not present

## 2020-10-26 DIAGNOSIS — R739 Hyperglycemia, unspecified: Secondary | ICD-10-CM | POA: Diagnosis not present

## 2020-10-26 DIAGNOSIS — Z7984 Long term (current) use of oral hypoglycemic drugs: Secondary | ICD-10-CM

## 2020-10-26 DIAGNOSIS — Z9911 Dependence on respirator [ventilator] status: Secondary | ICD-10-CM | POA: Diagnosis not present

## 2020-10-26 DIAGNOSIS — E1165 Type 2 diabetes mellitus with hyperglycemia: Secondary | ICD-10-CM | POA: Diagnosis present

## 2020-10-26 DIAGNOSIS — R0600 Dyspnea, unspecified: Secondary | ICD-10-CM

## 2020-10-26 DIAGNOSIS — L89000 Pressure ulcer of unspecified elbow, unstageable: Secondary | ICD-10-CM | POA: Diagnosis not present

## 2020-10-26 DIAGNOSIS — J449 Chronic obstructive pulmonary disease, unspecified: Secondary | ICD-10-CM | POA: Diagnosis not present

## 2020-10-26 DIAGNOSIS — J9690 Respiratory failure, unspecified, unspecified whether with hypoxia or hypercapnia: Secondary | ICD-10-CM

## 2020-10-26 DIAGNOSIS — Z01818 Encounter for other preprocedural examination: Secondary | ICD-10-CM

## 2020-10-26 DIAGNOSIS — G928 Other toxic encephalopathy: Secondary | ICD-10-CM | POA: Diagnosis not present

## 2020-10-26 DIAGNOSIS — B3781 Candidal esophagitis: Secondary | ICD-10-CM | POA: Diagnosis present

## 2020-10-26 DIAGNOSIS — B370 Candidal stomatitis: Secondary | ICD-10-CM | POA: Diagnosis present

## 2020-10-26 DIAGNOSIS — E875 Hyperkalemia: Secondary | ICD-10-CM | POA: Diagnosis not present

## 2020-10-26 DIAGNOSIS — I509 Heart failure, unspecified: Secondary | ICD-10-CM

## 2020-10-26 DIAGNOSIS — J156 Pneumonia due to other aerobic Gram-negative bacteria: Secondary | ICD-10-CM | POA: Diagnosis not present

## 2020-10-26 DIAGNOSIS — Z85850 Personal history of malignant neoplasm of thyroid: Secondary | ICD-10-CM

## 2020-10-26 DIAGNOSIS — E11649 Type 2 diabetes mellitus with hypoglycemia without coma: Secondary | ICD-10-CM | POA: Diagnosis present

## 2020-10-26 DIAGNOSIS — J340 Abscess, furuncle and carbuncle of nose: Secondary | ICD-10-CM | POA: Diagnosis not present

## 2020-10-26 DIAGNOSIS — R069 Unspecified abnormalities of breathing: Secondary | ICD-10-CM

## 2020-10-26 DIAGNOSIS — R1310 Dysphagia, unspecified: Secondary | ICD-10-CM

## 2020-10-26 DIAGNOSIS — J9621 Acute and chronic respiratory failure with hypoxia: Secondary | ICD-10-CM | POA: Diagnosis not present

## 2020-10-26 DIAGNOSIS — Z8719 Personal history of other diseases of the digestive system: Secondary | ICD-10-CM

## 2020-10-26 LAB — GLUCOSE, CAPILLARY
Glucose-Capillary: 124 mg/dL — ABNORMAL HIGH (ref 70–99)
Glucose-Capillary: 153 mg/dL — ABNORMAL HIGH (ref 70–99)

## 2020-10-26 LAB — HEMOGLOBIN A1C
Hgb A1c MFr Bld: 6.9 % — ABNORMAL HIGH (ref 4.8–5.6)
Mean Plasma Glucose: 151.33 mg/dL

## 2020-10-26 MED ORDER — TIOTROPIUM BROMIDE MONOHYDRATE 18 MCG IN CAPS: 18.0000 ug | ORAL_CAPSULE | Freq: Every day | RESPIRATORY_TRACT | Status: DC

## 2020-10-26 MED ORDER — ALBUTEROL SULFATE (2.5 MG/3ML) 0.083% IN NEBU: 2.5000 mg | INHALATION_SOLUTION | Freq: Four times a day (QID) | RESPIRATORY_TRACT | Status: DC | PRN

## 2020-10-26 MED ORDER — TRAZODONE HCL 50 MG PO TABS
100.0000 mg | ORAL_TABLET | Freq: Every day | ORAL | Status: DC
  Administered 2020-10-26: 22:00:00 100 mg via ORAL
  Filled 2020-10-26: qty 1

## 2020-10-26 MED ORDER — DEXAMETHASONE SODIUM PHOSPHATE 4 MG/ML IJ SOLN
4.0000 mg | Freq: Four times a day (QID) | INTRAMUSCULAR | Status: DC
  Administered 2020-10-26 – 2020-10-27 (×3): 4 mg via INTRAVENOUS
  Filled 2020-10-26 (×4): qty 1

## 2020-10-26 MED ORDER — CHLORHEXIDINE GLUCONATE 0.12 % MT SOLN: 15.0000 mL | Freq: Once | OROMUCOSAL | Status: DC

## 2020-10-26 MED ORDER — FLUOXETINE HCL 40 MG PO CAPS: 40.0000 mg | ORAL_CAPSULE | Freq: Every day | ORAL | Status: DC

## 2020-10-26 MED ORDER — POTASSIUM CHLORIDE CRYS ER 20 MEQ PO TBCR: 20.0000 meq | EXTENDED_RELEASE_TABLET | ORAL | Status: DC

## 2020-10-26 MED ORDER — LOSARTAN POTASSIUM 50 MG PO TABS
50.0000 mg | ORAL_TABLET | Freq: Every day | ORAL | Status: DC
  Administered 2020-10-26: 50 mg via ORAL
  Filled 2020-10-26 (×3): qty 1

## 2020-10-26 MED ORDER — DIAZEPAM 5 MG PO TABS: 5.0000 mg | ORAL_TABLET | Freq: Four times a day (QID) | ORAL | Status: DC | PRN

## 2020-10-26 MED ORDER — ISOSORBIDE MONONITRATE ER 30 MG PO TB24
60.0000 mg | ORAL_TABLET | Freq: Every day | ORAL | Status: DC
  Filled 2020-10-26: qty 1
  Filled 2020-10-26: qty 2

## 2020-10-26 MED ORDER — VITAMIN D 25 MCG (1000 UNIT) PO TABS
1000.0000 [IU] | ORAL_TABLET | Freq: Every day | ORAL | Status: DC
  Filled 2020-10-26 (×2): qty 1

## 2020-10-26 MED ORDER — GABAPENTIN 600 MG PO TABS
300.0000 mg | ORAL_TABLET | Freq: Three times a day (TID) | ORAL | Status: DC
  Administered 2020-10-26: 22:00:00 300 mg via ORAL
  Filled 2020-10-26: qty 0.5
  Filled 2020-10-26: qty 1

## 2020-10-26 MED ORDER — ATORVASTATIN CALCIUM 40 MG PO TABS
40.0000 mg | ORAL_TABLET | Freq: Every day | ORAL | Status: DC
  Filled 2020-10-26 (×2): qty 1

## 2020-10-26 MED ORDER — UMECLIDINIUM BROMIDE 62.5 MCG/INH IN AEPB
1.0000 | INHALATION_SPRAY | Freq: Every day | RESPIRATORY_TRACT | Status: DC
  Administered 2020-10-27 – 2020-11-02 (×6): 1 via RESPIRATORY_TRACT
  Filled 2020-10-26: qty 7

## 2020-10-26 MED ORDER — LEVOTHYROXINE SODIUM 75 MCG PO TABS
175.0000 ug | ORAL_TABLET | Freq: Every day | ORAL | Status: DC
  Filled 2020-10-26: qty 1

## 2020-10-26 MED ORDER — FUROSEMIDE 40 MG PO TABS
40.0000 mg | ORAL_TABLET | Freq: Two times a day (BID) | ORAL | Status: DC
  Administered 2020-10-26: 22:00:00 40 mg via ORAL
  Filled 2020-10-26 (×2): qty 1

## 2020-10-26 MED ORDER — DIPHENHYDRAMINE HCL 25 MG PO CAPS: 25.0000 mg | ORAL_CAPSULE | Freq: Four times a day (QID) | ORAL | Status: DC | PRN

## 2020-10-26 MED ORDER — SODIUM CHLORIDE 0.9 % IV SOLN: INTRAVENOUS | Status: DC

## 2020-10-26 MED ORDER — METFORMIN HCL 500 MG PO TABS
500.0000 mg | ORAL_TABLET | Freq: Every day | ORAL | Status: DC
  Filled 2020-10-26 (×2): qty 1

## 2020-10-26 MED ORDER — INSULIN ASPART 100 UNIT/ML ~~LOC~~ SOLN
0.0000 [IU] | SUBCUTANEOUS | Status: DC
  Administered 2020-10-26: 4 [IU] via SUBCUTANEOUS
  Administered 2020-10-27 – 2020-10-29 (×6): 3 [IU] via SUBCUTANEOUS
  Administered 2020-10-29: 4 [IU] via SUBCUTANEOUS
  Administered 2020-10-29 (×2): 3 [IU] via SUBCUTANEOUS
  Administered 2020-10-29: 20:00:00 7 [IU] via SUBCUTANEOUS
  Administered 2020-10-29 (×2): 3 [IU] via SUBCUTANEOUS
  Administered 2020-10-30 (×5): 4 [IU] via SUBCUTANEOUS
  Administered 2020-10-30: 20:00:00 7 [IU] via SUBCUTANEOUS
  Administered 2020-10-31: 05:00:00 3 [IU] via SUBCUTANEOUS
  Administered 2020-10-31: 20:00:00 7 [IU] via SUBCUTANEOUS
  Administered 2020-10-31 (×3): 4 [IU] via SUBCUTANEOUS
  Administered 2020-11-01: 21:00:00 7 [IU] via SUBCUTANEOUS
  Administered 2020-11-01: 12:00:00 4 [IU] via SUBCUTANEOUS
  Administered 2020-11-01: 05:00:00 3 [IU] via SUBCUTANEOUS
  Administered 2020-11-01 – 2020-11-02 (×6): 4 [IU] via SUBCUTANEOUS
  Administered 2020-11-02: 09:00:00 3 [IU] via SUBCUTANEOUS
  Administered 2020-11-02 – 2020-11-04 (×7): 4 [IU] via SUBCUTANEOUS
  Administered 2020-11-04: 08:00:00 3 [IU] via SUBCUTANEOUS
  Administered 2020-11-04: 7 [IU] via SUBCUTANEOUS
  Administered 2020-11-04 – 2020-11-06 (×10): 4 [IU] via SUBCUTANEOUS
  Administered 2020-11-06: 01:00:00 3 [IU] via SUBCUTANEOUS
  Administered 2020-11-06: 09:00:00 4 [IU] via SUBCUTANEOUS
  Administered 2020-11-06: 04:00:00 11 [IU] via SUBCUTANEOUS
  Administered 2020-11-06 – 2020-11-07 (×3): 3 [IU] via SUBCUTANEOUS
  Administered 2020-11-07 (×4): 4 [IU] via SUBCUTANEOUS
  Administered 2020-11-08: 17:00:00 3 [IU] via SUBCUTANEOUS
  Administered 2020-11-08: 4 [IU] via SUBCUTANEOUS
  Administered 2020-11-08: 20:00:00 3 [IU] via SUBCUTANEOUS
  Administered 2020-11-08 (×2): 4 [IU] via SUBCUTANEOUS
  Administered 2020-11-08: 12:00:00 3 [IU] via SUBCUTANEOUS
  Administered 2020-11-09 – 2020-11-10 (×9): 4 [IU] via SUBCUTANEOUS
  Administered 2020-11-10: 21:00:00 11 [IU] via SUBCUTANEOUS
  Administered 2020-11-10: 01:00:00 4 [IU] via SUBCUTANEOUS
  Administered 2020-11-11: 18:00:00 5 [IU] via SUBCUTANEOUS
  Administered 2020-11-11 – 2020-11-12 (×5): 4 [IU] via SUBCUTANEOUS
  Administered 2020-11-12: 13:00:00 3 [IU] via SUBCUTANEOUS
  Administered 2020-11-12 (×2): 7 [IU] via SUBCUTANEOUS
  Administered 2020-11-12 – 2020-11-13 (×4): 4 [IU] via SUBCUTANEOUS
  Administered 2020-11-13: 04:00:00 7 [IU] via SUBCUTANEOUS
  Administered 2020-11-13: 08:00:00 11 [IU] via SUBCUTANEOUS
  Administered 2020-11-13 – 2020-11-14 (×6): 4 [IU] via SUBCUTANEOUS
  Administered 2020-11-14: 08:00:00 3 [IU] via SUBCUTANEOUS

## 2020-10-26 MED ORDER — ORAL CARE MOUTH RINSE
15.0000 mL | Freq: Once | OROMUCOSAL | Status: AC
  Administered 2020-10-26: 15 mL via OROMUCOSAL

## 2020-10-26 MED ORDER — DOCUSATE SODIUM 100 MG PO CAPS
100.0000 mg | ORAL_CAPSULE | Freq: Two times a day (BID) | ORAL | Status: DC
  Administered 2020-10-26: 22:00:00 100 mg via ORAL
  Filled 2020-10-26 (×3): qty 1

## 2020-10-26 MED ORDER — VERAPAMIL HCL ER 180 MG PO TBCR
180.0000 mg | EXTENDED_RELEASE_TABLET | Freq: Every day | ORAL | Status: DC | PRN
  Filled 2020-10-26: qty 1

## 2020-10-26 MED ORDER — PANTOPRAZOLE SODIUM 40 MG PO TBEC
40.0000 mg | DELAYED_RELEASE_TABLET | Freq: Every day | ORAL | Status: DC
  Administered 2020-10-26: 22:00:00 40 mg via ORAL
  Filled 2020-10-26 (×3): qty 1

## 2020-10-26 MED ORDER — LACTATED RINGERS IV SOLN: INTRAVENOUS | Status: DC

## 2020-10-26 MED ORDER — OXYCODONE HCL 5 MG PO TABS: 5.0000 mg | ORAL_TABLET | ORAL | Status: DC | PRN

## 2020-10-26 MED ORDER — FLUOXETINE HCL 20 MG PO CAPS
40.0000 mg | ORAL_CAPSULE | Freq: Every day | ORAL | Status: DC
  Filled 2020-10-26 (×2): qty 2

## 2020-10-26 MED ORDER — FLUTICASONE FUROATE-VILANTEROL 200-25 MCG/INH IN AEPB
1.0000 | INHALATION_SPRAY | Freq: Every day | RESPIRATORY_TRACT | Status: DC
  Administered 2020-10-27: 09:00:00 1 via RESPIRATORY_TRACT
  Filled 2020-10-26: qty 28

## 2020-10-26 MED ORDER — HYDROMORPHONE HCL 1 MG/ML IJ SOLN
0.5000 mg | INTRAMUSCULAR | Status: DC | PRN
  Administered 2020-10-26 – 2020-10-28 (×8): 0.5 mg via INTRAVENOUS
  Filled 2020-10-26 (×9): qty 1

## 2020-10-26 MED ORDER — OXYCODONE HCL 5 MG PO TABS
10.0000 mg | ORAL_TABLET | ORAL | Status: DC | PRN
  Administered 2020-10-26 – 2020-10-27 (×2): 10 mg via ORAL
  Filled 2020-10-26 (×2): qty 2

## 2020-10-26 NOTE — H&P (Signed)
[name] [name] is a 74 y.o. male Whom today underwent an uncomplicated anterior cervical discetomy at C3/4, and at C5/6. Prior to the decompression I removed the existing plate at B3/4. Initially he was drowsy but moving all extremities. Approximately 1500 he was still not complaining of pain in the left upper extremity. Approximately 1507 he started by the nurses report to scream complaining of pain in the left upper extremity. Also noted to be weaker on the left than right. I requested he be transferred to [facility] for further evaluation.  Allergies  Allergen Reactions  . Ibuprofen     Other reaction(s): Angioedema   Past Medical History:  Diagnosis Date  . Anxiety   . Anxiety disorder 05/05/2014   unspecifed  . Cancer [facility])    prostate cancer   . CHF (congestive heart failure) ([place])   . Colonic polyp   . COPD (chronic obstructive pulmonary disease) ([place])   . Depression   . Dyspnea   . Elevated prostate specific antigen (PSA)   . Essential (primary) hypertension 05/05/2014  . GI bleed    due to diverticulitis   . Heart murmur    hx of 1960s  . History of thyroid cancer 05/05/2014  . Hypercholesteremia   . Hyperlipidemia   . Hypertension   . Hypothyroidism 05/05/2014  . Impotence of organic origin   . Male erectile dysfunction 12/29/2012  . Malignant neoplasm of prostate ([place])    10/30/2011 T1c - Identified by needle biopsy . Gleason 7 (3+4) 5 of 12 cores, Bilateral   . Prediabetes 06/22/2017  . Prostate cancer ([place])   . Shortness of breath on exertion 07/11/2015  . Sleep apnea    CPAP , report on chart , not used in last 2 weeks   . Thyroid cancer ([place])   . Thyroid disease   . Urinary obstruction    not elsewhere classified   Family History  Problem Relation Age of Onset  . Hypertension Mother   . Anxiety disorder Mother   . Stroke Mother        "mini strokes"  . Coronary artery disease Mother   . Heart attack Mother   . Emphysema Father   . Heart disease Father   .  Scoliosis Father   . Coronary artery disease Father   . GU problems Neg Hx   . Kidney disease Neg Hx   . Prostate cancer Neg Hx    Social History   Socioeconomic History  . Marital status: Unknown    Spouse name: Not on file  . Number of children: 0  . Years of education: 84  . Highest education level: Associate degree: occupational, Hotel manager, or vocational program  Occupational History  . Not on file  Tobacco Use  . Smoking status: Current Every Day Smoker    Packs/day: 0.50    Years: 35.00    Pack years: 17.50  . Smokeless tobacco: Never Used  Vaping Use  . Vaping Use: Never used  Substance and Sexual Activity  . Alcohol use: Not Currently    Alcohol/week: 2.0 standard drinks    Types: 1 Glasses of wine, 1 Shots of liquor per week  . Drug use: No  . Sexual activity: Not on file  Other Topics Concern  . Not on file  Social History Narrative  . Not on file   Social Determinants of Health   Financial Resource Strain: Not on file  Food Insecurity: Not on file  Transportation Needs: Not on file  Physical Activity: Not on file  Stress: Not on file  Social Connections: Not on file  Intimate Partner Violence: Not on file   Prior to Admission medications   Medication Sig Start Date End Date Taking? Authorizing Provider  acetaminophen (TYLENOL) 325 MG tablet Take 650 mg by mouth every 4 (four) hours as needed.  Patient not taking: Reported on 10/09/2020    [provider]  albuterol (VENTOLIN HFA) 108 (90 Base) MCG/ACT inhaler Inhale 2 puffs into the lungs every 6 (six) hours as needed for wheezing or shortness of breath.    [provider]  aspirin EC 81 MG tablet Take 81 mg by mouth daily.  Patient not taking: Reported on 10/09/2020    [provider]  budesonide-formoterol (SYMBICORT) 160-4.5 MCG/ACT inhaler Inhale 2 puffs into the lungs 2 (two) times daily.     [provider]  Cholecalciferol 25 MCG (1000 UT) tablet Take 1,000  Units by mouth daily.    [provider]  clotrimazole (MYCELEX) 10 MG troche  12/30/19   [provider]  diazepam (VALIUM) 5 MG tablet Take 5 mg by mouth every 6 (six) hours as needed for muscle spasms.     [provider]  diphenhydrAMINE (BENADRYL) 25 MG tablet Take 25 mg by mouth every 6 (six) hours as needed for allergies.    [provider]  docusate sodium (COLACE) 100 MG capsule Take 100 mg by mouth 2 (two) times daily.    [provider]  Eszopiclone 3 MG TABS Take 3 mg by mouth at bedtime as needed (sleep).  01/16/20   [provider]  FLUoxetine (PROZAC) 40 MG capsule Take 40 mg by mouth daily.    [provider]  furosemide (LASIX) 20 MG tablet Take 1 tablet (20 mg total) by mouth daily. Patient not taking: Reported on 10/09/2020 11/30/18   Otila Back, MD  furosemide (LASIX) 40 MG tablet Take 40 mg by mouth 2 (two) times daily.    [provider]  gabapentin (NEURONTIN) 100 MG capsule Take 100 mg by mouth 3 (three) times daily.     [provider]  HYDROcodone-acetaminophen (NORCO/VICODIN) 5-325 MG tablet Take 1 tablet by mouth 3 (three) times daily as needed for moderate pain.    [provider]  ipratropium-albuterol (DUONEB) 0.5-2.5 (3) MG/3ML SOLN Take 3 mLs by nebulization every 6 (six) hours as needed (Shortness of breath, wheezing.). Patient not taking: Reported on 10/09/2020 02/10/18   Henreitta Leber, MD  isosorbide mononitrate (IMDUR) 60 MG 24 hr tablet Take 60 mg by mouth daily.  03/12/20   [provider]  levothyroxine (SYNTHROID) 175 MCG tablet Take 175 mcg by mouth daily before breakfast.     [provider]  linaclotide (LINZESS) 290 MCG CAPS capsule Take 1 capsule (290 mcg total) by mouth daily before breakfast. Patient not taking: Reported on 10/09/2020 05/03/20   Lucilla Lame, MD  losartan (COZAAR) 50 MG tablet Take 50 mg by mouth daily. 10/19/18   [provider]  metFORMIN (GLUCOPHAGE) 500 MG tablet Take 500 mg by mouth daily. 05/24/20   [provider]  methocarbamol (ROBAXIN) 500 MG tablet  03/20/20   [provider]  montelukast (SINGULAIR) 10 MG tablet Take 10 mg by mouth Nightly. Patient not taking: Reported on 10/09/2020    [provider]  omeprazole (PRILOSEC) 20 MG capsule Take 20 mg by mouth daily.    [provider]  oxyCODONE (OXY IR/ROXICODONE) 5 MG  immediate release tablet Take 5 mg by mouth 4 (four) times daily as needed for pain. 10/02/20   [provider]  potassium chloride SA (K-DUR,KLOR-CON) 20 MEQ tablet Take 20 mEq by mouth 2 (two) times a week.     [provider]  simvastatin (ZOCOR) 80 MG tablet Take 80 mg by mouth daily.     [provider]  tiotropium (SPIRIVA HANDIHALER) 18 MCG inhalation capsule Place 18 mcg into inhaler and inhale daily.  12/08/18   [provider]  tiZANidine (ZANAFLEX) 4 MG tablet Take 4 mg by mouth in the morning and at bedtime.  03/12/20   [provider]  traZODone (DESYREL) 100 MG tablet Take 100 mg by mouth at bedtime.  02/04/20   [provider]  verapamil (CALAN-SR) 180 MG CR tablet Take 180 mg by mouth daily as needed (blood pressure of 150/90 or higher).     [provider]  Vitamin D, Ergocalciferol, (DRISDOL) 1.25 MG (50000 UNIT) CAPS capsule  02/06/20   [provider]  zolpidem (AMBIEN) 5 MG tablet Take 5 mg by mouth at bedtime as needed for sleep. Patient not taking: Reported on 10/09/2020    [provider]   Physical Exam Constitutional:      Appearance: Normal appearance. He is overweight.     Interventions: He is sedated.  HENT:     Head: Normocephalic and atraumatic.     Mouth/Throat:     Mouth: Mucous membranes are moist.     Pharynx: Oropharynx is clear.  Eyes:     Extraocular Movements: Extraocular movements intact.     Conjunctiva/sclera: Conjunctivae  normal.     Pupils: Pupils are equal, round, and reactive to light.  Neck:     Comments: Fresh incision left side, drain in place  Skin:    General: Skin is warm and dry.  Neurological:     Mental Status: He is lethargic.     Cranial Nerves: Cranial nerves are intact.     Sensory: Sensation is intact.     Motor: Weakness present.     Coordination: Coordination is intact.     Comments: Not moving left upper extremity as well as right  Confined to the bed since surgery   A/P Unknown why sudden complaints of pain in the left upper extremity. No obvious cause during operation. Will order cervical spine CT.

## 2020-10-26 NOTE — Progress Notes (Signed)
Patient ID: [name] [name], male   DOB: 02/29/1946, 74 y.o.   MRN: [id number] Films reviewed, no hardware misplaced, malpositioned at this time, no canaL hematoma, no prevertebral hematoma.  Will give steroids, no indications for operative decompression.

## 2020-10-27 ENCOUNTER — Inpatient Hospital Stay (HOSPITAL_COMMUNITY): Payer: Medicare Other | Admitting: Certified Registered Nurse Anesthetist

## 2020-10-27 ENCOUNTER — Inpatient Hospital Stay (HOSPITAL_COMMUNITY): Payer: Medicare Other

## 2020-10-27 ENCOUNTER — Encounter (HOSPITAL_COMMUNITY)
Admission: AD | Disposition: A | Discharge status: Skilled Nursing Facility | Payer: Self-pay | Source: Ambulatory Visit | Attending: Neurosurgery

## 2020-10-27 HISTORY — PX: POSTERIOR CERVICAL LAMINECTOMY: SHX2248

## 2020-10-27 LAB — GLUCOSE, CAPILLARY
Glucose-Capillary: 140 mg/dL — ABNORMAL HIGH (ref 70–99)
Glucose-Capillary: 147 mg/dL — ABNORMAL HIGH (ref 70–99)
Glucose-Capillary: 144 mg/dL — ABNORMAL HIGH (ref 70–99)
Glucose-Capillary: 134 mg/dL — ABNORMAL HIGH (ref 70–99)
Glucose-Capillary: 143 mg/dL — ABNORMAL HIGH (ref 70–99)

## 2020-10-27 LAB — POCT I-STAT, CHEM 8
BUN: 19 mg/dL (ref 8–23)
Calcium, Ion: 1.22 mmol/L (ref 1.15–1.40)
Chloride: 98 mmol/L (ref 98–111)
Creatinine, Ser: 0.8 mg/dL (ref 0.61–1.24)
Glucose, Bld: 151 mg/dL — ABNORMAL HIGH (ref 70–99)
HCT: 39 % (ref 39.0–52.0)
Hemoglobin: 13.3 g/dL (ref 13.0–17.0)
Potassium: 4.4 mmol/L (ref 3.5–5.1)
Sodium: 135 mmol/L (ref 135–145)
TCO2: 28 mmol/L (ref 22–32)

## 2020-10-27 LAB — TYPE AND SCREEN
ABO/RH(D): A POS
Antibody Screen: NEGATIVE

## 2020-10-27 LAB — SARS CORONAVIRUS 2 BY RT PCR (HOSPITAL ORDER, PERFORMED IN ~~LOC~~ HOSPITAL LAB): SARS Coronavirus 2: NEGATIVE

## 2020-10-27 SURGERY — POSTERIOR CERVICAL LAMINECTOMY
Anesthesia: General | Site: Spine Cervical

## 2020-10-27 MED ORDER — ESMOLOL HCL 100 MG/10ML IV SOLN
INTRAVENOUS | Status: AC
  Filled 2020-10-27: qty 10

## 2020-10-27 MED ORDER — ESMOLOL HCL 100 MG/10ML IV SOLN
INTRAVENOUS | Status: DC | PRN
  Administered 2020-10-27 (×4): 20 mg via INTRAVENOUS

## 2020-10-27 MED ORDER — LACTATED RINGERS IV SOLN: INTRAVENOUS | Status: DC | PRN

## 2020-10-27 MED ORDER — FENTANYL CITRATE (PF) 250 MCG/5ML IJ SOLN
INTRAMUSCULAR | Status: DC | PRN
  Administered 2020-10-27: 150 ug via INTRAVENOUS
  Administered 2020-10-27 (×2): 50 ug via INTRAVENOUS

## 2020-10-27 MED ORDER — PROPOFOL 10 MG/ML IV BOLUS
INTRAVENOUS | Status: AC
  Filled 2020-10-27: qty 40

## 2020-10-27 MED ORDER — DEXAMETHASONE SODIUM PHOSPHATE 10 MG/ML IJ SOLN
INTRAMUSCULAR | Status: DC | PRN
  Administered 2020-10-27: 10 mg via INTRAVENOUS

## 2020-10-27 MED ORDER — 0.9 % SODIUM CHLORIDE (POUR BTL) OPTIME
TOPICAL | Status: DC | PRN
  Administered 2020-10-27: 19:00:00 1000 mL

## 2020-10-27 MED ORDER — CEFAZOLIN SODIUM-DEXTROSE 2-4 GM/100ML-% IV SOLN: 2.0000 g | Freq: Once | INTRAVENOUS | Status: DC

## 2020-10-27 MED ORDER — THROMBIN 5000 UNITS EX SOLR
CUTANEOUS | Status: AC
  Filled 2020-10-27: qty 5000

## 2020-10-27 MED ORDER — LIDOCAINE-EPINEPHRINE 1 %-1:100000 IJ SOLN
INTRAMUSCULAR | Status: DC | PRN
  Administered 2020-10-27: 8 mL

## 2020-10-27 MED ORDER — CHLORHEXIDINE GLUCONATE CLOTH 2 % EX PADS
6.0000 | MEDICATED_PAD | Freq: Every day | CUTANEOUS | Status: DC
  Administered 2020-10-28 – 2020-11-08 (×11): 6 via TOPICAL

## 2020-10-27 MED ORDER — ROCURONIUM BROMIDE 10 MG/ML (PF) SYRINGE
PREFILLED_SYRINGE | INTRAVENOUS | Status: DC | PRN
  Administered 2020-10-27: 40 mg via INTRAVENOUS
  Administered 2020-10-27: 20 mg via INTRAVENOUS

## 2020-10-27 MED ORDER — CEFAZOLIN SODIUM-DEXTROSE 2-3 GM-%(50ML) IV SOLR
INTRAVENOUS | Status: DC | PRN
  Administered 2020-10-27: 2 g via INTRAVENOUS

## 2020-10-27 MED ORDER — SUGAMMADEX SODIUM 200 MG/2ML IV SOLN
INTRAVENOUS | Status: DC | PRN
  Administered 2020-10-27: 200 mg via INTRAVENOUS

## 2020-10-27 MED ORDER — METHYLPREDNISOLONE ACETATE 80 MG/ML IJ SUSP
INTRAMUSCULAR | Status: AC
  Filled 2020-10-27: qty 1

## 2020-10-27 MED ORDER — FENTANYL CITRATE (PF) 250 MCG/5ML IJ SOLN
INTRAMUSCULAR | Status: AC
  Filled 2020-10-27: qty 5

## 2020-10-27 MED ORDER — ONDANSETRON HCL 4 MG/2ML IJ SOLN
INTRAMUSCULAR | Status: DC | PRN
  Administered 2020-10-27: 4 mg via INTRAVENOUS

## 2020-10-27 MED ORDER — THROMBIN 5000 UNITS EX SOLR: OROMUCOSAL | Status: DC | PRN

## 2020-10-27 MED ORDER — PROPOFOL 10 MG/ML IV BOLUS
INTRAVENOUS | Status: DC | PRN
  Administered 2020-10-27: 200 mg via INTRAVENOUS
  Administered 2020-10-27: 40 mg via INTRAVENOUS

## 2020-10-27 MED ORDER — DIAZEPAM 5 MG/ML IJ SOLN
2.5000 mg | Freq: Once | INTRAMUSCULAR | Status: AC
  Administered 2020-10-27: 14:00:00 2.5 mg via INTRAVENOUS
  Filled 2020-10-27: qty 2

## 2020-10-27 MED ORDER — SUCCINYLCHOLINE CHLORIDE 200 MG/10ML IV SOSY
PREFILLED_SYRINGE | INTRAVENOUS | Status: DC | PRN
  Administered 2020-10-27: 140 mg via INTRAVENOUS

## 2020-10-27 MED ORDER — HYDROMORPHONE HCL 1 MG/ML IJ SOLN
0.2500 mg | INTRAMUSCULAR | Status: DC | PRN
  Administered 2020-10-27 (×2): 0.25 mg via INTRAVENOUS
  Administered 2020-10-27: 0.5 mg via INTRAVENOUS

## 2020-10-27 MED ORDER — BACITRACIN ZINC 500 UNIT/GM EX OINT
TOPICAL_OINTMENT | CUTANEOUS | Status: AC
  Filled 2020-10-27: qty 28.35

## 2020-10-27 MED ORDER — LIDOCAINE 2% (20 MG/ML) 5 ML SYRINGE
INTRAMUSCULAR | Status: DC | PRN
  Administered 2020-10-27: 100 mg via INTRAVENOUS

## 2020-10-27 MED ORDER — LIDOCAINE-EPINEPHRINE 1 %-1:100000 IJ SOLN
INTRAMUSCULAR | Status: AC
  Filled 2020-10-27: qty 1

## 2020-10-27 MED ORDER — LACTATED RINGERS IV SOLN: INTRAVENOUS | Status: DC

## 2020-10-27 MED ORDER — HYDROMORPHONE HCL 1 MG/ML IJ SOLN
INTRAMUSCULAR | Status: AC
  Filled 2020-10-27: qty 1

## 2020-10-27 MED ORDER — CEFAZOLIN SODIUM-DEXTROSE 2-4 GM/100ML-% IV SOLN
INTRAVENOUS | Status: AC
  Filled 2020-10-27: qty 100

## 2020-10-27 MED ORDER — DEXAMETHASONE SODIUM PHOSPHATE 4 MG/ML IJ SOLN
4.0000 mg | Freq: Three times a day (TID) | INTRAMUSCULAR | Status: AC
  Administered 2020-10-27 – 2020-10-28 (×3): 4 mg via INTRAVENOUS
  Filled 2020-10-27 (×3): qty 1

## 2020-10-27 SURGICAL SUPPLY — 44 items
BAND RUBBER #18 3X1/16 STRL (MISCELLANEOUS) ×4 IMPLANT
BLADE CLIPPER SURG (BLADE) ×2 IMPLANT
BLADE SURG 11 STRL SS (BLADE) ×2 IMPLANT
BUR MATCHSTICK NEURO 3.0 LAGG (BURR) ×2 IMPLANT
BUR PRECISION FLUTE 5.0 (BURR) ×2 IMPLANT
CANISTER SUCT 3000ML PPV (MISCELLANEOUS) ×2 IMPLANT
DERMABOND ADVANCED (GAUZE/BANDAGES/DRESSINGS) ×1
DERMABOND ADVANCED .7 DNX12 (GAUZE/BANDAGES/DRESSINGS) ×1 IMPLANT
DRAPE C-ARM 42X72 X-RAY (DRAPES) ×4 IMPLANT
DRAPE LAPAROTOMY 100X72 PEDS (DRAPES) ×2 IMPLANT
DRAPE MICROSCOPE LEICA (MISCELLANEOUS) ×2 IMPLANT
DRAPE SURG 17X23 STRL (DRAPES) ×2 IMPLANT
DURAPREP 26ML APPLICATOR (WOUND CARE) ×2 IMPLANT
ELECT REM PT RETURN 9FT ADLT (ELECTROSURGICAL) ×2
ELECTRODE REM PT RTRN 9FT ADLT (ELECTROSURGICAL) ×1 IMPLANT
GAUZE 4X4 16PLY RFD (DISPOSABLE) IMPLANT
GAUZE SPONGE 4X4 12PLY STRL (GAUZE/BANDAGES/DRESSINGS) IMPLANT
GLOVE BIO SURGEON STRL SZ7.5 (GLOVE) ×8 IMPLANT
GLOVE BIOGEL PI IND STRL 7.0 (GLOVE) ×2 IMPLANT
GLOVE BIOGEL PI IND STRL 7.5 (GLOVE) ×3 IMPLANT
GLOVE BIOGEL PI INDICATOR 7.0 (GLOVE) ×2
GLOVE BIOGEL PI INDICATOR 7.5 (GLOVE) ×3
GOWN STRL REUS W/ TWL LRG LVL3 (GOWN DISPOSABLE) ×3 IMPLANT
GOWN STRL REUS W/ TWL XL LVL3 (GOWN DISPOSABLE) IMPLANT
GOWN STRL REUS W/TWL 2XL LVL3 (GOWN DISPOSABLE) IMPLANT
GOWN STRL REUS W/TWL LRG LVL3 (GOWN DISPOSABLE) ×6
GOWN STRL REUS W/TWL XL LVL3 (GOWN DISPOSABLE)
HEMOSTAT POWDER KIT SURGIFOAM (HEMOSTASIS) ×2 IMPLANT
KIT BASIN OR (CUSTOM PROCEDURE TRAY) ×2 IMPLANT
KIT TURNOVER KIT B (KITS) ×2 IMPLANT
NEEDLE HYPO 22GX1.5 SAFETY (NEEDLE) ×2 IMPLANT
NS IRRIG 1000ML POUR BTL (IV SOLUTION) ×2 IMPLANT
PACK LAMINECTOMY NEURO (CUSTOM PROCEDURE TRAY) ×2 IMPLANT
SPONGE LAP 4X18 RFD (DISPOSABLE) IMPLANT
SUT MNCRL AB 3-0 PS2 18 (SUTURE) ×2 IMPLANT
SUT MON AB 3-0 SH 27 (SUTURE) ×2
SUT MON AB 3-0 SH27 (SUTURE) ×1 IMPLANT
SUT VIC AB 0 CT1 18XCR BRD8 (SUTURE) ×1 IMPLANT
SUT VIC AB 0 CT1 8-18 (SUTURE) ×2
SUT VIC AB 2-0 CP2 18 (SUTURE) ×2 IMPLANT
SUT VIC AB 4-0 RB1 18 (SUTURE) IMPLANT
TOWEL GREEN STERILE (TOWEL DISPOSABLE) ×2 IMPLANT
TOWEL GREEN STERILE FF (TOWEL DISPOSABLE) ×2 IMPLANT
WATER STERILE IRR 1000ML POUR (IV SOLUTION) ×2 IMPLANT

## 2020-10-27 NOTE — Anesthesia Preprocedure Evaluation (Addendum)
Anesthesia Evaluation  Patient identified by MRN, date of birth, ID band Patient awake  General Assessment Comment:Talked to Dr. Zada Finders via phone who wanted to wait intil COVID-19 teat resulted. Dr. Nyoka Cowden  Reviewed: Allergy & Precautions, NPO status , Patient's Chart, lab work & pertinent test results  Airway Mallampati: II  TM Distance: >3 FB     Dental   Pulmonary COPD, Current Smoker and Patient abstained from smoking.,    breath sounds clear to auscultation       Cardiovascular hypertension, +CHF  + Valvular Problems/Murmurs  Rhythm:Regular Rate:Normal     Neuro/Psych    GI/Hepatic negative GI ROS, Neg liver ROS,   Endo/Other  Hypothyroidism   Renal/GU negative Renal ROS     Musculoskeletal   Abdominal   Peds  Hematology   Anesthesia Other Findings   Reproductive/Obstetrics                            Anesthesia Physical Anesthesia Plan  ASA: III and emergent  Anesthesia Plan: General   Post-op Pain Management:    Induction: Intravenous  PONV Risk Score and Plan: 2 and Ondansetron, Dexamethasone and Midazolam  Airway Management Planned: Oral ETT  Additional Equipment:   Intra-op Plan:   Post-operative Plan: Possible Post-op intubation/ventilation  Informed Consent: I have reviewed the patients History and Physical, chart, labs and discussed the procedure including the risks, benefits and alternatives for the proposed anesthesia with the patient or authorized representative who has indicated his/her understanding and acceptance.     Dental advisory given  Plan Discussed with: Anesthesiologist, CRNA and Surgeon  Anesthesia Plan Comments:        Anesthesia Quick Evaluation

## 2020-10-27 NOTE — Plan of Care (Signed)

## 2020-10-27 NOTE — Progress Notes (Signed)
Patient is having some trouble swallowing and we have made him n.p.o. we will get a swallowing evaluation on him.  He really does not have much movement in his left arm.  His fingers somewhat he has very weak wrist extension and he has some tricep with very little bicep and deltoid.  His voice is a little hoarse.  Drain is in place.  CT scan results from yesterday reviewed.  I am concerned about his arm weakness.  He remains on Decadron.  I think he needs an MRI to evaluate the cord.  MRI ordered.

## 2020-10-27 NOTE — Progress Notes (Signed)
Patient with current NPO order. Ordered PO medications. In 10/10 pain in Left arm. No other PRN pain medication to give a this time.  On call provider notified. See orders.

## 2020-10-27 NOTE — Anesthesia Procedure Notes (Signed)
Procedure Name: Intubation Date/Time: 10/27/2020 6:20 PM Performed by: Reece Agar, CRNA Pre-anesthesia Checklist: Patient identified, Emergency Drugs available, Suction available and Patient being monitored Patient Re-evaluated:Patient Re-evaluated prior to induction Oxygen Delivery Method: Circle System Utilized Preoxygenation: Pre-oxygenation with 100% oxygen Induction Type: IV induction Laryngoscope Size: Glidescope and 4 Grade View: Grade I Tube type: Oral Tube size: 7.5 mm Number of attempts: 1 Airway Equipment and Method: Stylet and Video-laryngoscopy Placement Confirmation: ETT inserted through vocal cords under direct vision,  positive ETCO2 and breath sounds checked- equal and bilateral Secured at: 23 cm Tube secured with: Tape Dental Injury: Teeth and Oropharynx as per pre-operative assessment

## 2020-10-27 NOTE — Evaluation (Signed)
Clinical/Bedside Swallow Evaluation Patient Details  Name: [name] [name] MRN: [id number] Date of Birth: 13-Mar-1946  Today's Date: 10/27/2020 Time: SLP Start Time (ACUTE ONLY): 1128 SLP Stop Time (ACUTE ONLY): 1147 SLP Time Calculation (min) (ACUTE ONLY): 19 min  Past Medical History:  Past Medical History:  Diagnosis Date  . Anxiety   . Anxiety disorder 05/05/2014   unspecifed  . Cancer [facility])    prostate cancer   . CHF (congestive heart failure) ([place])   . Colonic polyp   . COPD (chronic obstructive pulmonary disease) ([place])   . Depression   . Dyspnea   . Elevated prostate specific antigen (PSA)   . Essential (primary) hypertension 05/05/2014  . GI bleed    due to diverticulitis   . Heart murmur    hx of 1960s  . History of thyroid cancer 05/05/2014  . Hypercholesteremia   . Hyperlipidemia   . Hypertension   . Hypothyroidism 05/05/2014  . Impotence of organic origin   . Male erectile dysfunction 12/29/2012  . Malignant neoplasm of prostate ([place])    10/30/2011 T1c - Identified by needle biopsy . Gleason 7 (3+4) 5 of 12 cores, Bilateral   . Prediabetes 06/22/2017  . Prostate cancer ([place])   . Shortness of breath on exertion 07/11/2015  . Sleep apnea    CPAP , report on chart , not used in last 2 weeks   . Thyroid cancer ([place])   . Thyroid disease   . Urinary obstruction    not elsewhere classified   Past Surgical History:  Past Surgical History:  Procedure Laterality Date  . BACK SURGERY     lumbar 1987   . BACK SURGERY     C-6- Plate&Pin  . CATARACT EXTRACTION W/PHACO Right 09/15/2018   Procedure: CATARACT EXTRACTION PHACO AND INTRAOCULAR LENS PLACEMENT ([place]) RIGHT;  Surgeon: Leandrew Koyanagi, MD;  Location: [place];  Service: Ophthalmology;  Laterality: Right;  Requests to be last case  . CATARACT EXTRACTION W/PHACO Left 10/06/2018   Procedure: CATARACT EXTRACTION PHACO AND INTRAOCULAR LENS PLACEMENT ([place])  LEFT;  Surgeon: Leandrew Koyanagi, MD;   Location: [place];  Service: Ophthalmology;  Laterality: Left;  . CERVICAL DISC SURGERY    . COLONOSCOPY     10/06/2011, 06/05/2006, 05/06/2002 adematous polyps: CBF 09/2014; Recall Ltr mailed 02/27/2015 ([place])  . COLONOSCOPY WITH PROPOFOL N/A 07/06/2015   Procedure: COLONOSCOPY WITH PROPOFOL;  Surgeon: Manya Silvas, MD;  Location: [facility] ENDOSCOPY;  Service: Endoscopy;  Laterality: N/A;  . COLONOSCOPY WITH PROPOFOL N/A 09/01/2018   Procedure: COLONOSCOPY WITH PROPOFOL;  Surgeon: Manya Silvas, MD;  Location: [facility] ENDOSCOPY;  Service: Endoscopy;  Laterality: N/A;  . COLONOSCOPY WITH PROPOFOL N/A 04/17/2020   Procedure: COLONOSCOPY WITH PROPOFOL;  Surgeon: Lucilla Lame, MD;  Location: [facility] ENDOSCOPY;  Service: Endoscopy;  Laterality: N/A;  . ESOPHAGOGASTRODUODENOSCOPY (EGD) WITH PROPOFOL N/A 04/17/2020   Procedure: ESOPHAGOGASTRODUODENOSCOPY (EGD) WITH PROPOFOL;  Surgeon: Lucilla Lame, MD;  Location: [facility] ENDOSCOPY;  Service: Endoscopy;  Laterality: N/A;  . EYE SURGERY    . HERNIA REPAIR     umbilical hernia repair   . LAMINECTOMY FOR EXCISION / EVACUATION INTRASPINAL LESION     lumbar  . OTHER SURGICAL HISTORY     right rotator cuff surgery   . OTHER SURGICAL HISTORY     bilateral tubes in ears   . POSTERIOR LAMINECTOMY / DECOMPRESSION CERVICAL SPINE    . PROSTATE BIOPSY     10/22/2011 Volume:55.8 cc's, PSA:4.5, Free PSA:12%  .  ROBOT ASSISTED LAPAROSCOPIC RADICAL PROSTATECTOMY  12/15/2011   Procedure: ROBOTIC ASSISTED LAPAROSCOPIC RADICAL PROSTATECTOMY LEVEL 2;  Surgeon: Dutch Gray, MD;  Location: [facility];  Service: Urology;  Laterality: N/A;      . ROTATOR CUFF REPAIR Right   . TONSILLECTOMY    . TOTAL THYROIDECTOMY     HPI:  Pt is a 74 yo male s/p anterior cervical discetomy at C3/4 and C5/6 following removal of existing plate, admitted afterwards for sudden onset weakness and pain in his LUE. CT unremarkable; MRI pending. On 12/11 he was made NPO for difficulty swallowing.  PMH includes: thyroid ca, prostate ca, HTN, HLD, dyspnea, depression, COPD, CHF, anxiety   Assessment / Plan / Recommendation Clinical Impression  Pt has significant pain in his throat with swallowing and consistent coughing, with only ice chips tested. More advance textures deferred in light of pain, although pt is also eager to be able to eat again. He is also confused, requiring multiple attempts at reorienting him throughout this evaluation, and his speech sounds slurred despite adequate oral motor exam. He frequently closes his L eye when trying to focus but denies diplopia. RN already aware of these changes and says MD is awaiting results of MRI. Will continue to follow and progress as able, recommending that pt remain NPO for now. SLP Visit Diagnosis: Dysphagia, unspecified (R13.10)    Aspiration Risk  Moderate aspiration risk;Risk for inadequate nutrition/hydration    Diet Recommendation NPO   Medication Administration: Via alternative means    Other  Recommendations Oral Care Recommendations: Oral care QID   Follow up Recommendations  (tba)      Frequency and Duration min 2x/week  2 weeks       Prognosis Prognosis for Safe Diet Advancement: Good      Swallow Study   General HPI: Pt is a 74 yo male s/p anterior cervical discetomy at C3/4 and C5/6 following removal of existing plate, admitted afterwards for sudden onset weakness and pain in his LUE. CT unremarkable; MRI pending. On 12/11 he was made NPO for difficulty swallowing. PMH includes: thyroid ca, prostate ca, HTN, HLD, dyspnea, depression, COPD, CHF, anxiety Type of Study: Bedside Swallow Evaluation Previous Swallow Assessment: none in chart Diet Prior to this Study: NPO Temperature Spikes Noted: No Respiratory Status: Nasal cannula History of Recent Intubation:  (for procedure only on 12/10) Behavior/Cognition: Alert;Cooperative;Confused Oral Cavity Assessment: Dry Oral Care Completed by SLP: No Oral Cavity -  Dentition: Adequate natural dentition Self-Feeding Abilities: Total assist Patient Positioning: Upright in bed Baseline Vocal Quality: Other (comment) (rough) Volitional Cough: Weak;Congested Volitional Swallow: Able to elicit    Oral/Motor/Sensory Function Overall Oral Motor/Sensory Function: Generalized oral weakness   Ice Chips Ice chips: Impaired Presentation: Spoon Pharyngeal Phase Impairments: Cough - Immediate   Thin Liquid Thin Liquid: Not tested    Nectar Thick Nectar Thick Liquid: Not tested   Honey Thick Honey Thick Liquid: Not tested   Puree Puree: Not tested   Solid     Solid: Not tested      Osie Bond., M.A. [facility] Pager [phone] Office [phone]  10/27/2020,11:54 AM

## 2020-10-27 NOTE — Anesthesia Postprocedure Evaluation (Signed)
Anesthesia Post Note  Patient: [name] [name]  Procedure(s) Performed: POSTERIOR CERVICAL LAMINECTOMY CERVICAL THREE-CERVICAL SIX (N/A Spine Cervical)     Patient location during evaluation: PACU Anesthesia Type: General Level of consciousness: awake Pain management: pain level controlled Vital Signs Assessment: post-procedure vital signs reviewed and stable Respiratory status: spontaneous breathing Cardiovascular status: stable Postop Assessment: no apparent nausea or vomiting Anesthetic complications: no   No complications documented.  Last Vitals:  Vitals:   10/27/20 2155 10/27/20 2200  BP:    Pulse: 77   Resp: 17   Temp:  36.4 C  SpO2: 97%     Last Pain:  Vitals:   10/27/20 2200  TempSrc:   PainSc: 4                  Wilkes Potvin

## 2020-10-27 NOTE — Progress Notes (Signed)
Patient did not handle drinking water this a.m with am medications. Did not give. On call provider notified for recommendation of NPO order  and ST consult. Awaiting call back. Will pass on to day shift RN.

## 2020-10-27 NOTE — Op Note (Signed)
PATIENT: [name] [name]  DAY OF SURGERY: 10/27/20   PRE-OPERATIVE DIAGNOSIS:  Cervical stenosis   POST-OPERATIVE DIAGNOSIS:  Cervical stenosis   PROCEDURE:  C3-6 posterior decompressive laminectomies   SURGEON:  Surgeon(s) and Role:    Judith Part, MD - Primary   ANESTHESIA: ETGA   BRIEF HISTORY: This is a 74 year old man who presented after one of my colleagues performed a 2 level ACDF. Post-operatively, he developed some dysesthetic pain and LUE weakness that prompted further workup, revealing cord signal change and cervical stenosis. Given his clinical exam and radiographic findings, I recommended posterior decompression. This was discussed with the patient as well as risks, benefits, and alternatives and wished to proceed with surgery.   OPERATIVE DETAIL: The patient was taken to the operating room and anesthesia was induced by the anesthesia team. The patient was placed in a [place] head holder and carefully placed on the OR table in the prone position with padding of all pressure points. A formal time out was performed with two patient identifiers and confirmed the operative site. The operative site was marked, hair was clipped with surgical clippers, the area was then prepped and draped in a sterile fashion. Fluoro was used to localize the operative level and a midline incision was placed to expose from C3 to C6. Subperiosteal dissection was performed bilaterally and fluoroscopy was again used to confirm the surgical level.   Decompression was performed, which consisted of laminectomies at C3, C4, C5, and C6. At C3 there was some very thick and calcified ligament that was adherent to the dura and very compressive. There was diffuse stenosis but this area was certainly the worse. After decompression was complete, hemostasis was obtained. All instrument and sponge counts were correct, the incision was then closed in layers. The patient was then returned to anesthesia for emergence. No  apparent complications at the completion of the procedure.   EBL:  228mL   DRAINS: none   SPECIMENS: none   Judith Part, MD 10/27/20 8:59 PM

## 2020-10-27 NOTE — Brief Op Note (Signed)
10/27/2020  8:58 PM  PATIENT:  [name] [name]  74 y.o. male  PRE-OPERATIVE DIAGNOSIS:  POST OP ANTERIOR CERVICAL SURGERY  POST-OPERATIVE DIAGNOSIS:  canal stenosis  PROCEDURE:  Procedure(s): POSTERIOR CERVICAL LAMINECTOMY CERVICAL THREE-CERVICAL SIX (N/A)  SURGEON:  Surgeon(s) and Role:    * Hyland Mollenkopf, Joyice Faster, MD - Primary  PHYSICIAN ASSISTANT:   ASSISTANTS: none   ANESTHESIA:   general  EBL:  200 mL   BLOOD ADMINISTERED:none  DRAINS: none   LOCAL MEDICATIONS USED:  LIDOCAINE   SPECIMEN:  No Specimen  DISPOSITION OF SPECIMEN:  N/A  COUNTS:  YES  TOURNIQUET:  * No tourniquets in log *  DICTATION: .Note written in EPIC  PLAN OF CARE: Admit to inpatient   PATIENT DISPOSITION:  PACU - hemodynamically stable.   Delay start of Pharmacological VTE agent (>24hrs) due to surgical blood loss or risk of bleeding: yes

## 2020-10-27 NOTE — Transfer of Care (Signed)
Immediate Anesthesia Transfer of Care Note  Patient: [name] [name]  Procedure(s) Performed: POSTERIOR CERVICAL LAMINECTOMY CERVICAL THREE-CERVICAL SIX (N/A Spine Cervical)  Patient Location: PACU  Anesthesia Type:General  Level of Consciousness: drowsy, patient cooperative and responds to stimulation  Airway & Oxygen Therapy: Patient Spontanous Breathing and Patient connected to face mask oxygen  Post-op Assessment: Report given to RN and Post -op Vital signs reviewed and stable  Post vital signs: Reviewed and stable  Last Vitals:  Vitals Value Taken Time  BP 176/83 10/27/20 2059  Temp    Pulse 78 10/27/20 2108  Resp 18 10/27/20 2108  SpO2 100 % 10/27/20 2108  Vitals shown include unvalidated device data.  Last Pain:  Vitals:   10/27/20 1618  TempSrc: Oral  PainSc: 8       H/H in PACU 13.3/39   Patients Stated Pain Goal: 4 (54/49/20 1007)  Complications: No complications documented.

## 2020-10-27 NOTE — Progress Notes (Signed)
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 Discussed with the patient, will take to the OR now for C3-6 decompressive laminectomies.

## 2020-10-28 ENCOUNTER — Inpatient Hospital Stay (HOSPITAL_COMMUNITY): Payer: Medicare Other

## 2020-10-28 ENCOUNTER — Encounter (HOSPITAL_COMMUNITY): Payer: Self-pay | Admitting: Neurological Surgery

## 2020-10-28 DIAGNOSIS — J9601 Acute respiratory failure with hypoxia: Secondary | ICD-10-CM

## 2020-10-28 DIAGNOSIS — M5412 Radiculopathy, cervical region: Secondary | ICD-10-CM

## 2020-10-28 LAB — GLUCOSE, CAPILLARY
Glucose-Capillary: 116 mg/dL — ABNORMAL HIGH (ref 70–99)
Glucose-Capillary: 118 mg/dL — ABNORMAL HIGH (ref 70–99)
Glucose-Capillary: 131 mg/dL — ABNORMAL HIGH (ref 70–99)
Glucose-Capillary: 140 mg/dL — ABNORMAL HIGH (ref 70–99)
Glucose-Capillary: 146 mg/dL — ABNORMAL HIGH (ref 70–99)
Glucose-Capillary: 147 mg/dL — ABNORMAL HIGH (ref 70–99)
Glucose-Capillary: 149 mg/dL — ABNORMAL HIGH (ref 70–99)

## 2020-10-28 LAB — BRAIN NATRIURETIC PEPTIDE: B Natriuretic Peptide: 213.4 pg/mL — ABNORMAL HIGH (ref 0.0–100.0)

## 2020-10-28 MED ORDER — GABAPENTIN 300 MG PO CAPS
300.0000 mg | ORAL_CAPSULE | Freq: Three times a day (TID) | ORAL | Status: DC
  Administered 2020-10-29 – 2020-11-22 (×67): 300 mg
  Filled 2020-10-28 (×68): qty 1

## 2020-10-28 MED ORDER — SODIUM CHLORIDE 0.9% FLUSH: 9.0000 mL | INTRAVENOUS | Status: DC | PRN

## 2020-10-28 MED ORDER — ONDANSETRON HCL 4 MG/2ML IJ SOLN: 4.0000 mg | Freq: Four times a day (QID) | INTRAMUSCULAR | Status: DC | PRN

## 2020-10-28 MED ORDER — NALOXONE HCL 0.4 MG/ML IJ SOLN: 0.4000 mg | INTRAMUSCULAR | Status: DC | PRN

## 2020-10-28 MED ORDER — DIPHENHYDRAMINE HCL 50 MG/ML IJ SOLN: 12.5000 mg | Freq: Four times a day (QID) | INTRAMUSCULAR | Status: DC | PRN

## 2020-10-28 MED ORDER — FUROSEMIDE 10 MG/ML IJ SOLN: 20.0000 mg | Freq: Two times a day (BID) | INTRAMUSCULAR | Status: DC

## 2020-10-28 MED ORDER — ALBUTEROL SULFATE (2.5 MG/3ML) 0.083% IN NEBU
2.5000 mg | INHALATION_SOLUTION | Freq: Four times a day (QID) | RESPIRATORY_TRACT | Status: DC
  Administered 2020-10-28 – 2020-11-01 (×15): 2.5 mg via RESPIRATORY_TRACT
  Filled 2020-10-28 (×15): qty 3

## 2020-10-28 MED ORDER — DIAZEPAM 5 MG/ML PO CONC: 5.0000 mg | Freq: Four times a day (QID) | ORAL | Status: DC | PRN

## 2020-10-28 MED ORDER — HYDROMORPHONE HCL 1 MG/ML IJ SOLN
0.5000 mg | Freq: Once | INTRAMUSCULAR | Status: AC
  Administered 2020-10-28: 01:00:00 0.5 mg via INTRAVENOUS
  Filled 2020-10-28: qty 1

## 2020-10-28 MED ORDER — SODIUM CHLORIDE 0.9 % IV SOLN
3.0000 g | Freq: Four times a day (QID) | INTRAVENOUS | Status: DC
  Administered 2020-10-28 – 2020-11-02 (×18): 3 g via INTRAVENOUS
  Filled 2020-10-28 (×5): qty 3
  Filled 2020-10-28: qty 8
  Filled 2020-10-28 (×6): qty 3
  Filled 2020-10-28: qty 8
  Filled 2020-10-28 (×2): qty 3
  Filled 2020-10-28 (×5): qty 8
  Filled 2020-10-28: qty 3
  Filled 2020-10-28: qty 8
  Filled 2020-10-28 (×2): qty 3

## 2020-10-28 MED ORDER — OXYCODONE HCL 5 MG/5ML PO SOLN: 7.5000 mg | ORAL | Status: DC | PRN

## 2020-10-28 MED ORDER — HYDROMORPHONE 1 MG/ML IV SOLN
INTRAVENOUS | Status: DC
  Administered 2020-10-28: 30 mg via INTRAVENOUS
  Administered 2020-10-28: 2.7 mg via INTRAVENOUS
  Administered 2020-10-28: 3.5 mL via INTRAVENOUS
  Administered 2020-10-29: 0.3 mg via INTRAVENOUS
  Administered 2020-10-29: 0.9 mg via INTRAVENOUS
  Administered 2020-10-29: 0.3 mg via INTRAVENOUS
  Administered 2020-10-29: 0 mg via INTRAVENOUS
  Administered 2020-10-29: 2.4 mg via INTRAVENOUS
  Administered 2020-10-29 (×2): 0.6 mg via INTRAVENOUS
  Administered 2020-10-30: 0.9 mg via INTRAVENOUS
  Administered 2020-10-30: 0.3 mg via INTRAVENOUS
  Administered 2020-10-30: 0 mg via INTRAVENOUS
  Administered 2020-10-30: 1.2 mg via INTRAVENOUS
  Filled 2020-10-28: qty 30

## 2020-10-28 MED ORDER — FUROSEMIDE 10 MG/ML IJ SOLN
20.0000 mg | Freq: Two times a day (BID) | INTRAMUSCULAR | Status: DC
  Administered 2020-10-28: 18:00:00 20 mg via INTRAVENOUS
  Filled 2020-10-28: qty 2

## 2020-10-28 MED ORDER — DIPHENHYDRAMINE HCL 12.5 MG/5ML PO ELIX
12.5000 mg | ORAL_SOLUTION | Freq: Four times a day (QID) | ORAL | Status: DC | PRN
  Filled 2020-10-28: qty 5

## 2020-10-28 MED ORDER — FUROSEMIDE 10 MG/ML IJ SOLN
40.0000 mg | Freq: Two times a day (BID) | INTRAMUSCULAR | Status: DC
  Administered 2020-10-29 – 2020-11-01 (×7): 40 mg via INTRAVENOUS
  Filled 2020-10-28 (×7): qty 4

## 2020-10-28 NOTE — Consult Note (Addendum)
NAME:  [name] [name], MRN:  [id number], DOB:  1945/12/28, LOS: 2 ADMISSION DATE:  10/26/2020, CONSULTATION DATE:  10/28/20 REFERRING MD:  Sherley Bounds, MD CHIEF COMPLAINT:  Acute hypoxemic respiratory failure  Brief History   74 year old s/p posterior cervical laminectomy  History of present illness   [name] [name] is a 74 year old male with cervical stenosis. Admitted for ACDF on 12/10 and developed LUE weakness post-op requiring cervical decompression on 12/11. PCCM called on 12/12 for post-op hypoxemia. Yesterday was on RA and needed supplemental O2 this morning which has now increased to NRB. Given lasix 20 mg. He is on NIV at home for OSA. Has not worn device since his first surgery.  Past Medical History  Cervical stenosis, chronic diastolic heart failure, OSA, COPD, neuropathy  Significant Hospital Events   12/10 ACDF 12/11 Cervical decompression 12/12 PCCM consulted for post-op hypoxemia  Consults:  NSG PCCM  Procedures:    Significant Diagnostic Tests:  CXR 10/28/20 - Left atelectasis  Micro Data:  SARS Covid 12/11 - neg  Antimicrobials:  N/A  Interim history/subjective:  As above  Objective   Blood pressure (!) 143/79, pulse 80, temperature 97.9 F (36.6 C), temperature source Oral, resp. rate 15, SpO2 (!) 89 %.        Intake/Output Summary (Last 24 hours) at 10/28/2020 1822 Last data filed at 10/28/2020 1532 Gross per 24 hour  Intake 1900 ml  Output 930 ml  Net 970 ml   There were no vitals filed for this visit.  Physical Exam: General: Chronically ill-appearing, mild acute distress HENT: [place], AT, OP clear, MMM, wearing NRB Eyes: EOMI, no scleral icterus Neck: Left JP drain in place with bloody output in line Respiratory: Anterior rhonchi bilaterally, diminished left sided air entry. Cardiovascular: RRR, -M/R/G, no JVD GI: BS+, soft, nontender Extremities:-Edema,-tenderness Neuro: AAO x4, CNII-XII grossly intact Skin: Intact, no rashes or  bruising Psych: Normal mood, normal affect  Resolved Hospital Problem list     Assessment & Plan:   Acute hypoxemic respiratory failure secondary to left pleural effusion and atelectasis probably from mucous plug, aspiration. Unable to tolerate BiPAP due to neck incision --Start Unasyn --Heated high flow nasal cannula. High risk for intubation and will need close monitoring in the ICU for airway protection --Aggressive pulmonary hygiene: CPT, scheduled nebs --Diurese. Increase lasix to 40mg  --CXR in am  Presumed COPD (no PFTs) --Scheduled bronchodilators  Chronic diastolic heart failure --Diuresis as above  Best practice (evaluated daily)   Diet: NPO Pain/Anxiety/Delirium protocol (if indicated): -- VAP protocol (if indicated): -- DVT prophylaxis: None. Check with NSG in am when to restart GI prophylaxis: PPI Glucose control: CBG q4h Mobility: BR last date of multidisciplinary goals of care discussion N/A Family and staff present N/A Summary of discussion N/A Follow up goals of care discussion due Code Status: Full code. Confirmed Disposition: Transfer to ICU  Labs   CBC: Recent Labs  Lab 10/27/20 2109  HGB 13.3  HCT 62.3    Basic Metabolic Panel: Recent Labs  Lab 10/27/20 2109  NA 135  K 4.4  CL 98  GLUCOSE 151*  BUN 19  CREATININE 0.80   GFR: Estimated Creatinine Clearance: 105.6 mL/min (by C-G formula based on SCr of 0.8 mg/dL). No results for input(s): PROCALCITON, WBC, LATICACIDVEN in the last 168 hours.  Liver Function Tests: No results for input(s): AST, ALT, ALKPHOS, BILITOT, PROT, ALBUMIN in the last 168 hours. No results for input(s): LIPASE, AMYLASE in the  last 168 hours. No results for input(s): AMMONIA in the last 168 hours.  ABG    Component Value Date/Time   HCO3 56.4 (H) 02/07/2018 1140   TCO2 28 10/27/2020 2109   O2SAT 97.1 02/07/2018 1140     Coagulation Profile: No results for input(s): INR, PROTIME in the last 168  hours.  Cardiac Enzymes: No results for input(s): CKTOTAL, CKMB, CKMBINDEX, TROPONINI in the last 168 hours.  HbA1C: Hgb A1c MFr Bld  Date/Time Value Ref Range Status  10/26/2020 06:35 PM 6.9 (H) 4.8 - 5.6 % Final    Comment:    (NOTE) Pre diabetes:          5.7%-6.4%  Diabetes:              >6.4%  Glycemic control for   <7.0% adults with diabetes     CBG: Recent Labs  Lab 10/28/20 0027 10/28/20 0454 10/28/20 0725 10/28/20 1128 10/28/20 1528  GLUCAP 147* 149* 118* 131* 116*    Review of Systems:   Unable to obtain due to mental status.   Past Medical History  He,  has a past medical history of Anxiety, Anxiety disorder (05/05/2014), Cancer ([place]), CHF (congestive heart failure) ([place]), Colonic polyp, COPD (chronic obstructive pulmonary disease) ([place]), Depression, Dyspnea, Elevated prostate specific antigen (PSA), Essential (primary) hypertension (05/05/2014), GI bleed, Heart murmur, History of thyroid cancer (05/05/2014), Hypercholesteremia, Hyperlipidemia, Hypertension, Hypothyroidism (05/05/2014), Impotence of organic origin, Male erectile dysfunction (12/29/2012), Malignant neoplasm of prostate ([place]), Prediabetes (06/22/2017), Prostate cancer ([place]), Shortness of breath on exertion (07/11/2015), Sleep apnea, Thyroid cancer ([place]), Thyroid disease, and Urinary obstruction.   Surgical History    Past Surgical History:  Procedure Laterality Date  . BACK SURGERY     lumbar 1987   . BACK SURGERY     C-6- Plate&Pin  . CATARACT EXTRACTION W/PHACO Right 09/15/2018   Procedure: CATARACT EXTRACTION PHACO AND INTRAOCULAR LENS PLACEMENT ([place]) RIGHT;  Surgeon: Leandrew Koyanagi, MD;  Location: [place];  Service: Ophthalmology;  Laterality: Right;  Requests to be last case  . CATARACT EXTRACTION W/PHACO Left 10/06/2018   Procedure: CATARACT EXTRACTION PHACO AND INTRAOCULAR LENS PLACEMENT ([place])  LEFT;  Surgeon: Leandrew Koyanagi, MD;  Location: [place];   Service: Ophthalmology;  Laterality: Left;  . CERVICAL DISC SURGERY    . COLONOSCOPY     10/06/2011, 06/05/2006, 05/06/2002 adematous polyps: CBF 09/2014; Recall Ltr mailed 02/27/2015 (dw)  . COLONOSCOPY WITH PROPOFOL N/A 07/06/2015   Procedure: COLONOSCOPY WITH PROPOFOL;  Surgeon: Manya Silvas, MD;  Location[facility] ENDOSCOPY;  Service: Endoscopy;  Laterality: N/A;  . COLONOSCOPY WITH PROPOFOL N/A 09/01/2018   Procedure: COLONOSCOPY WITH PROPOFOL;  Surgeon: Manya Silvas, MD;  Location: [facility] ENDOSCOPY;  Service: Endoscopy;  Laterality: N/A;  . COLONOSCOPY WITH PROPOFOL N/A 04/17/2020   Procedure: COLONOSCOPY WITH PROPOFOL;  Surgeon: Lucilla Lame, MD;  Location: [facility] ENDOSCOPY;  Service: Endoscopy;  Laterality: N/A;  . ESOPHAGOGASTRODUODENOSCOPY (EGD) WITH PROPOFOL N/A 04/17/2020   Procedure: ESOPHAGOGASTRODUODENOSCOPY (EGD) WITH PROPOFOL;  Surgeon: Lucilla Lame, MD;  Location: [facility] ENDOSCOPY;  Service: Endoscopy;  Laterality: N/A;  . EYE SURGERY    . HERNIA REPAIR     umbilical hernia repair   . LAMINECTOMY FOR EXCISION / EVACUATION INTRASPINAL LESION     lumbar  . OTHER SURGICAL HISTORY     right rotator cuff surgery   . OTHER SURGICAL HISTORY     bilateral tubes in ears   . POSTERIOR CERVICAL LAMINECTOMY N/A 10/27/2020  Procedure: POSTERIOR CERVICAL LAMINECTOMY CERVICAL THREE-CERVICAL SIX;  Surgeon: Judith Part, MD;  Location: [place];  Service: Neurosurgery;  Laterality: N/A;  . POSTERIOR LAMINECTOMY / DECOMPRESSION CERVICAL SPINE    . PROSTATE BIOPSY     10/22/2011 Volume:55.8 cc's, PSA:4.5, Free PSA:12%  . ROBOT ASSISTED LAPAROSCOPIC RADICAL PROSTATECTOMY  12/15/2011   Procedure: ROBOTIC ASSISTED LAPAROSCOPIC RADICAL PROSTATECTOMY LEVEL 2;  Surgeon: Dutch Gray, MD;  Location: [facility];  Service: Urology;  Laterality: N/A;      . ROTATOR CUFF REPAIR Right   . TONSILLECTOMY    . TOTAL THYROIDECTOMY       Social History   reports that he has been smoking. He has a 17.50  pack-year smoking history. He has never used smokeless tobacco. He reports previous alcohol use of about 2.0 standard drinks of alcohol per week. He reports that he does not use drugs.   Family History   His family history includes Anxiety disorder in his mother; Coronary artery disease in his father and mother; Emphysema in his father; Heart attack in his mother; Heart disease in his father; Hypertension in his mother; Scoliosis in his father; Stroke in his mother. There is no history of GU problems, Kidney disease, or Prostate cancer.   Allergies Allergies  Allergen Reactions  . Ibuprofen     Other reaction(s): Angioedema     Home Medications  Prior to Admission medications   Medication Sig Start Date End Date Taking? Authorizing Provider  budesonide-formoterol (SYMBICORT) 160-4.5 MCG/ACT inhaler Inhale 2 puffs into the lungs 2 (two) times daily.    Yes [provider]  Cholecalciferol 25 MCG (1000 UT) tablet Take 1,000 Units by mouth daily.   Yes [provider]  diazepam (VALIUM) 5 MG tablet Take 5 mg by mouth every 6 (six) hours as needed for muscle spasms.    Yes [provider]  diphenhydrAMINE (BENADRYL) 25 MG tablet Take 25 mg by mouth every 6 (six) hours as needed for allergies.   Yes [provider]  docusate sodium (COLACE) 100 MG capsule Take 100 mg by mouth 2 (two) times daily.   Yes [provider]  FLUoxetine (PROZAC) 40 MG capsule Take 40 mg by mouth daily.   Yes [provider]  furosemide (LASIX) 40 MG tablet Take 40 mg by mouth 2 (two) times daily.   Yes [provider]  gabapentin (NEURONTIN) 100 MG capsule Take 100 mg by mouth 3 (three) times daily.    Yes [provider]  HYDROcodone-acetaminophen (NORCO/VICODIN) 5-325 MG tablet Take 1 tablet by mouth 3 (three) times daily as needed for moderate pain.   Yes [provider]  isosorbide mononitrate (IMDUR) 60 MG 24 hr tablet Take 60 mg by  mouth daily.  03/12/20  Yes [provider]  levothyroxine (SYNTHROID) 175 MCG tablet Take 175 mcg by mouth daily before breakfast.    Yes [provider]  losartan (COZAAR) 50 MG tablet Take 50 mg by mouth daily. 10/19/18  Yes [provider]  metFORMIN (GLUCOPHAGE) 500 MG tablet Take 500 mg by mouth daily. 05/24/20  Yes [provider]  omeprazole (PRILOSEC) 20 MG capsule Take 20 mg by mouth daily.   Yes [provider]  oxyCODONE (OXY IR/ROXICODONE) 5 MG immediate release tablet Take 5 mg by mouth 4 (four) times daily as needed for pain. 10/02/20  Yes [provider]  potassium chloride SA (K-DUR,KLOR-CON) 20 MEQ tablet Take 20 mEq by mouth 3 (three) times daily.  Yes [provider]  simvastatin (ZOCOR) 80 MG tablet Take 80 mg by mouth daily.    Yes [provider]  tiotropium (SPIRIVA) 18 MCG inhalation capsule Place 18 mcg into inhaler and inhale daily.  12/08/18  Yes [provider]  tiZANidine (ZANAFLEX) 4 MG tablet Take 4 mg by mouth in the morning and at bedtime.  03/12/20  Yes [provider]  traZODone (DESYREL) 100 MG tablet Take 100 mg by mouth at bedtime.  02/04/20  Yes [provider]  albuterol (VENTOLIN HFA) 108 (90 Base) MCG/ACT inhaler Inhale 2 puffs into the lungs every 6 (six) hours as needed for wheezing or shortness of breath.    [provider]  methocarbamol (ROBAXIN) 500 MG tablet  03/20/20   [provider]  verapamil (CALAN-SR) 180 MG CR tablet Take 180 mg by mouth daily as needed (blood pressure of 150/90 or higher).     [provider]     Critical care time: 31 min    The patient is critically ill with multiple organ systems failure and requires high complexity decision making for assessment and support, frequent evaluation and titration of therapies, application of advanced monitoring technologies and extensive interpretation of multiple databases.      Rodman Pickle, M.D. [facility] Pulmonary/Critical Care Medicine 10/28/2020 6:22 PM   Please see Amion for pager number to reach on-call Pulmonary and Critical Care Team.

## 2020-10-28 NOTE — Progress Notes (Signed)
He looks better, much better mvmt in the L arm, now anti-grav, less pain, better swallowing.

## 2020-10-28 NOTE — Progress Notes (Signed)
Late entry for dayshift summary of care today: Upon arrival this am RN and NT placed sacral foam for prevention and condom catheter as well as repositioning and placement of pads under patient. Patient upon assessment noted to be on 3.5L of O2 with O2 sats in 90s. Patient c/o of severe neck pain and crying. Dilaudid 0.5mg  iv given x 1. Lungs noted to be coarse and wheezes noted in upper lobes. IS started and patient instructed on use. Patient able to perform with RN assistance of 10 resp ranging from 250 to 700. Mouth care performed via suctioning and moisturizer place on lips. Incisions noted to be dry with old drainage noted to anterior dressing site. A text/chat was sent for Dr. Christella Noa about patient's NPO status and ST eval as patient had not yet been cleared and was exhibiting s/sx of possible aspiration as evidence by coughing and gurgling with secretions and ice given yesterday, inability to clear secretions, and recent anterior and posterior swelling and surgery. ST made visit and noted that patient was not able to tolerated HOB elevated for testing, unable to cooperate with testing at bedside due to uncontrolled pain and lethargy. Dr. Christella Noa upon arrival to see another patient on unit was stopped by RN who inquired about patient's inability to properly use PCA Dilaudid. Patient has been instructed on use of controller but was screaming out in pain but not able to always use controller to self administer pain meds. Inquiry also made about inability to take in po meds and concern expressed about patient's uncontrolled pain, COPD, and CHF history with increasing need for oxygen and wheezing. NGT was order. RN and Investment banker, corporate were unsuccessful in placing NG due to patient began to have increased heart rate in 120s, increased respirations, O2 sat dropped to 84% on 5L via [place], very diaphoretic, red/purple in face and trying to climb out of bed due to air hunger and saying "I can't breathe! I'd rather  die. Let me out of bed." Patient is alert and oriented x 3 but disoriented at times to time and seeing people in the room per caregiver. RRT called and Dr. Ronnald Ramp. Dr. Ronnald Ramp made visit after Lasix 20mg  iv was given as ordered and diuresis of 248ml in foley bag. RRT Saralyn Pilar also made visit. Patient's caregiver [name] was at bedside. Sister [name] was updated by Probation officer. Report given to night shift due to no bed in ICU at time of change of shift.

## 2020-10-28 NOTE — Progress Notes (Signed)
2200 Received from PACU. Patient disoriented x3. Confusion/Forgetful. Foley  still in place. Skin assessment done. Sacrum pressure injury.    1025  Patient A&Ox4. Still has some confusion. Easily able to reorient. Attempts to get OOB but able to redirect.     0650 Pain control is not well managed at this time. New orders being placed by on call provider.

## 2020-10-28 NOTE — Progress Notes (Signed)
Pharmacy Antibiotic Note  [name] [name] is a 74 y.o. male admitted on 10/26/2020 with aspiration pneumonia.  Pharmacy has been consulted for Unasyn dosing.  Plan: -Start Unasyn 3 gm IV Q 6 hours -Monitor cultures, CBC and clinical progress     Temp (24hrs), Avg:97.8 F (36.6 C), Min:97 F (36.1 C), Max:98.5 F (36.9 C)  Recent Labs  Lab 10/27/20 2109  CREATININE 0.80    Estimated Creatinine Clearance: 105.6 mL/min (by C-G formula based on SCr of 0.8 mg/dL).    Allergies  Allergen Reactions  . Ibuprofen     Other reaction(s): Angioedema    Antimicrobials this admission: Unasyn 12/12 >>   Dose adjustments this admission:  Microbiology results:   Thank you for allowing pharmacy to be a part of this patient's care.  Albertina Parr, PharmD., BCPS, BCCCP Clinical Pharmacist Please refer to [facility] for unit-specific pharmacist

## 2020-10-28 NOTE — Progress Notes (Signed)
eLink Physician-Brief Progress Note Patient Name: [name] [name] DOB: 11/10/1946 MRN: [id number]   Date of Service  10/28/2020  HPI/Events of Note  74 year old male with cervical stenosis. Admitted for ACDF on 12/10 and developed LUE weakness post-op requiring cervical decompression on 12/11. PCCM called on 12/12 for post-op hypoxemia. Yesterday was on RA and needed supplemental O2 this morning which has now increased to NRB. Given lasix 20 mg. He is on NIV at home for OSA. Has not worn device since his first surgery.Sat now 88% to 91%. Review of CXR:  1. There is extensive, dense heterogeneous airspace opacity and consolidation of the left lung with a moderate, layering pleural effusion. Findings are concerning for infection or aspiration. 2. There is some bandlike scarring or atelectasis of the right lung base; the right lung iotherwise normally aerated.  Patient is already on Unasyn for possible aspiration.   eICU Interventions  Incentive spirometry Q 1 hour while awake.      Intervention Category Evaluation Type: New Patient Evaluation  Lysle Dingwall 10/28/2020, 11:05 PM

## 2020-10-28 NOTE — Progress Notes (Signed)
Jp dressing reinforced with Abd pad and tape d/t drainage. No output from jp drain since received from PACU.

## 2020-10-28 NOTE — Progress Notes (Signed)
  Speech Language Pathology Treatment: Dysphagia  Patient Details Name: [name] [name] MRN: [id number] DOB: January 08, 1946 Today's Date: 10/28/2020 Time: 3825-0539 SLP Time Calculation (min) (ACUTE ONLY): 20 min  Assessment / Plan / Recommendation Clinical Impression  Patient seen with caregiver and RN in room to assess for readiness to tolerate PO's. Patient's caregiver did report that patient was having some swallowing difficulties about a month prior to hospitalization. He had been s/p cervical disectomy at C3/4 and C5/6 and then yesterday (12/11) he required C3-6 posterior decompressive laminectomies. He was intubated for surgery only. Patient was lethargic but able to verbalize, with voice being low in intensity and hoarse but difficult to differentiate between poor respiratory support and post surgical vocal impairment. Patient's oxygen levels were hovering in high 80's with nasal cannula and RN was considering placing venti mask. Patient was in a lot of post surgical pain and even with slight movements when SLP and RN attempted to pull him up in bed, patient was not able to tolerate. SLP did not administer any PO's due to patient's decreased alertness and inability to get him adequately positioned in bed. As patient has now had two surgeries to cervical spine and has reported recent history of swallowing difficulties, he will likely need an objective swallow study. At this time, anticipate he would not tolerate being in confined space of chair in radiology suite. SLP will follow patient for readiness for PO trials and likely eventual objective swallow study. Recommend to continue with oral care and may moisten mouth with toothette sponges soaked in water and/or 1-2 ice chips if patient alert and accepting, but to discontinue if any coughing.   HPI HPI: Pt is a 74 yo male s/p anterior cervical discetomy at C3/4 and C5/6 following removal of existing plate, admitted afterwards for sudden onset weakness and  pain in his LUE. CT unremarkable; MRI pending. On 12/11 he was made NPO for difficulty swallowing. On 12/11 he underwent C3-6 posterior decompressive laminectomies during which he was intubated. PMH includes: thyroid ca, prostate ca, HTN, HLD, dyspnea, depression, COPD, CHF, anxiety      SLP Plan  Continue with current plan of care       Recommendations  Diet recommendations: NPO Medication Administration: Via alternative means                Oral Care Recommendations: Oral care QID;Staff/trained caregiver to provide oral care Follow up Recommendations: Other (comment) (pending progress) SLP Visit Diagnosis: Dysphagia, unspecified (R13.10) Plan: Continue with current plan of care       GO               Sonia Baller, MA, CCC-SLP Speech Therapy

## 2020-10-28 NOTE — Progress Notes (Signed)
Patient currently yelling out in pain. No other PRN medication to give at this time. Patient needs to be NPO per ST and this RN assessment. On call made aware earlier on in the shift.   On call notified. Awaiting orders.

## 2020-10-29 ENCOUNTER — Inpatient Hospital Stay (HOSPITAL_COMMUNITY): Payer: Medicare Other

## 2020-10-29 LAB — BASIC METABOLIC PANEL
Anion gap: 10 (ref 5–15)
BUN: 20 mg/dL (ref 8–23)
CO2: 28 mmol/L (ref 22–32)
Calcium: 8.8 mg/dL — ABNORMAL LOW (ref 8.9–10.3)
Chloride: 99 mmol/L (ref 98–111)
Creatinine, Ser: 0.81 mg/dL (ref 0.61–1.24)
GFR, Estimated: >60 mL/min (ref >60)
Glucose, Bld: 129 mg/dL — ABNORMAL HIGH (ref 70–99)
Potassium: 3.7 mmol/L (ref 3.5–5.1)
Sodium: 137 mmol/L (ref 135–145)

## 2020-10-29 LAB — CBC WITH DIFFERENTIAL/PLATELET
Abs Immature Granulocytes: 0.11 10*3/uL — ABNORMAL HIGH (ref 0.00–0.07)
Basophils Absolute: 0 10*3/uL (ref 0.0–0.1)
Basophils Relative: 0 %
Eosinophils Absolute: 0 10*3/uL (ref 0.0–0.5)
Eosinophils Relative: 0 %
HCT: 38.7 % — ABNORMAL LOW (ref 39.0–52.0)
Hemoglobin: 13.4 g/dL (ref 13.0–17.0)
Immature Granulocytes: 1 %
Lymphocytes Relative: 5 %
Lymphs Abs: 0.8 10*3/uL (ref 0.7–4.0)
MCH: 31.2 pg (ref 26.0–34.0)
MCHC: 34.6 g/dL (ref 30.0–36.0)
MCV: 90 fL (ref 80.0–100.0)
Monocytes Absolute: 2.1 10*3/uL — ABNORMAL HIGH (ref 0.1–1.0)
Monocytes Relative: 12 %
Neutro Abs: 14.5 10*3/uL — ABNORMAL HIGH (ref 1.7–7.7)
Neutrophils Relative %: 82 %
Platelets: 277 10*3/uL (ref 150–400)
RBC: 4.3 MIL/uL (ref 4.22–5.81)
RDW: 12.8 % (ref 11.5–15.5)
WBC: 17.4 10*3/uL — ABNORMAL HIGH (ref 4.0–10.5)
nRBC: 0 % (ref 0.0–0.2)

## 2020-10-29 LAB — GLUCOSE, CAPILLARY
Glucose-Capillary: 124 mg/dL — ABNORMAL HIGH (ref 70–99)
Glucose-Capillary: 129 mg/dL — ABNORMAL HIGH (ref 70–99)
Glucose-Capillary: 138 mg/dL — ABNORMAL HIGH (ref 70–99)
Glucose-Capillary: 150 mg/dL — ABNORMAL HIGH (ref 70–99)
Glucose-Capillary: 180 mg/dL — ABNORMAL HIGH (ref 70–99)
Glucose-Capillary: 201 mg/dL — ABNORMAL HIGH (ref 70–99)

## 2020-10-29 LAB — PHOSPHORUS
Phosphorus: 2.1 mg/dL — ABNORMAL LOW (ref 2.5–4.6)
Phosphorus: 3.2 mg/dL (ref 2.5–4.6)

## 2020-10-29 LAB — MAGNESIUM
Magnesium: 2 mg/dL (ref 1.7–2.4)
Magnesium: 2 mg/dL (ref 1.7–2.4)

## 2020-10-29 MED ORDER — FLUTICASONE FUROATE-VILANTEROL 200-25 MCG/INH IN AEPB
1.0000 | INHALATION_SPRAY | Freq: Every day | RESPIRATORY_TRACT | Status: DC
  Administered 2020-10-29 – 2020-11-02 (×5): 1 via RESPIRATORY_TRACT

## 2020-10-29 MED ORDER — TRAZODONE HCL 100 MG PO TABS
100.0000 mg | ORAL_TABLET | Freq: Every day | ORAL | Status: DC
  Administered 2020-10-29 – 2020-11-21 (×24): 100 mg
  Filled 2020-10-29 (×6): qty 2
  Filled 2020-10-29: qty 1
  Filled 2020-10-29 (×2): qty 2
  Filled 2020-10-29: qty 1
  Filled 2020-10-29 (×4): qty 2
  Filled 2020-10-29: qty 1
  Filled 2020-10-29 (×3): qty 2
  Filled 2020-10-29: qty 1
  Filled 2020-10-29 (×2): qty 2
  Filled 2020-10-29: qty 1
  Filled 2020-10-29 (×2): qty 2

## 2020-10-29 MED ORDER — PANTOPRAZOLE SODIUM 40 MG PO PACK
40.0000 mg | PACK | Freq: Every day | ORAL | Status: DC
  Administered 2020-10-29 – 2020-11-14 (×17): 40 mg
  Filled 2020-10-29 (×17): qty 20

## 2020-10-29 MED ORDER — ADULT MULTIVITAMIN W/MINERALS CH: 1.0000 | ORAL_TABLET | Freq: Every day | ORAL | Status: DC

## 2020-10-29 MED ORDER — LABETALOL HCL 5 MG/ML IV SOLN
20.0000 mg | INTRAVENOUS | Status: DC | PRN
  Administered 2020-10-29 – 2020-10-31 (×3): 20 mg via INTRAVENOUS
  Filled 2020-10-29 (×3): qty 4

## 2020-10-29 MED ORDER — PIVOT 1.5 CAL PO LIQD
1000.0000 mL | ORAL | Status: DC
  Administered 2020-10-29 – 2020-11-07 (×8): 1000 mL
  Filled 2020-10-29 (×9): qty 1000

## 2020-10-29 MED ORDER — LOSARTAN POTASSIUM 50 MG PO TABS
50.0000 mg | ORAL_TABLET | Freq: Every day | ORAL | Status: DC
  Administered 2020-10-29 – 2020-11-08 (×11): 50 mg
  Filled 2020-10-29 (×11): qty 1

## 2020-10-29 MED ORDER — ADULT MULTIVITAMIN LIQUID CH
15.0000 mL | Freq: Every day | ORAL | Status: DC
  Administered 2020-10-29 – 2020-11-06 (×9): 15 mL
  Filled 2020-10-29 (×9): qty 15

## 2020-10-29 MED ORDER — BISACODYL 10 MG RE SUPP
10.0000 mg | Freq: Every day | RECTAL | Status: DC | PRN
  Filled 2020-10-29: qty 1

## 2020-10-29 MED ORDER — FLUOXETINE HCL 20 MG PO CAPS: 40.0000 mg | ORAL_CAPSULE | Freq: Every day | ORAL | Status: DC

## 2020-10-29 MED ORDER — DOCUSATE SODIUM 50 MG/5ML PO LIQD
100.0000 mg | Freq: Two times a day (BID) | ORAL | Status: DC
  Administered 2020-10-29 – 2020-11-20 (×34): 100 mg
  Filled 2020-10-29 (×36): qty 10

## 2020-10-29 MED ORDER — DIAZEPAM 5 MG/ML PO CONC: 5.0000 mg | Freq: Four times a day (QID) | ORAL | Status: DC | PRN

## 2020-10-29 MED ORDER — POTASSIUM CHLORIDE 20 MEQ PO PACK: 20.0000 meq | PACK | ORAL | Status: DC

## 2020-10-29 MED ORDER — METFORMIN HCL 500 MG PO TABS
500.0000 mg | ORAL_TABLET | Freq: Every day | ORAL | Status: DC
  Administered 2020-10-30 – 2020-11-08 (×10): 500 mg
  Filled 2020-10-29 (×10): qty 1

## 2020-10-29 MED ORDER — FLUOXETINE HCL 20 MG PO CAPS
40.0000 mg | ORAL_CAPSULE | Freq: Every day | ORAL | Status: DC
  Administered 2020-10-29 – 2020-11-19 (×22): 40 mg
  Filled 2020-10-29 (×21): qty 2

## 2020-10-29 MED ORDER — VITAMIN D 25 MCG (1000 UNIT) PO TABS
1000.0000 [IU] | ORAL_TABLET | Freq: Every day | ORAL | Status: DC
  Administered 2020-10-29 – 2020-11-19 (×22): 1000 [IU]
  Filled 2020-10-29 (×21): qty 1

## 2020-10-29 MED ORDER — LOSARTAN POTASSIUM 50 MG PO TABS: 50.0000 mg | ORAL_TABLET | Freq: Every day | ORAL | Status: DC

## 2020-10-29 MED ORDER — LEVOTHYROXINE SODIUM 75 MCG PO TABS
175.0000 ug | ORAL_TABLET | Freq: Every day | ORAL | Status: DC
  Administered 2020-10-30 – 2020-11-22 (×22): 175 ug
  Filled 2020-10-29 (×22): qty 1

## 2020-10-29 MED ORDER — ATORVASTATIN CALCIUM 40 MG PO TABS
40.0000 mg | ORAL_TABLET | Freq: Every day | ORAL | Status: DC
  Administered 2020-10-29 – 2020-11-19 (×22): 40 mg
  Filled 2020-10-29 (×21): qty 1

## 2020-10-29 MED ORDER — VITAMIN D 25 MCG (1000 UNIT) PO TABS: 1000.0000 [IU] | ORAL_TABLET | Freq: Every day | ORAL | Status: DC

## 2020-10-29 MED ORDER — ATORVASTATIN CALCIUM 40 MG PO TABS: 40.0000 mg | ORAL_TABLET | Freq: Every day | ORAL | Status: DC

## 2020-10-29 NOTE — Progress Notes (Signed)
  Speech Language Pathology Treatment: Dysphagia  Patient Details Name: ORIS STAFFIERI MRN: [id number] DOB: 1946-05-29 Today's Date: 10/29/2020 Time: 3893-7342 SLP Time Calculation (min) (ACUTE ONLY): 16 min  Assessment / Plan / Recommendation Clinical Impression  Pt was adequately awake this morning, reporting pain. He tolerated more upright position for approximately 5-8 minutes during oral care and ice chip trial with SpO2 falling to 83%. Baseline, there were no audible secretions and vocal quality was mildly dysfunctional. Suspect transient penetration/aspiration of secretions as slight cough after volitional saliva swallow noted. He also exhibited an immediate throat clear with one ice chip. Visible edema at and around surgical site. He is unable to medically have instrumental assessment given unstable vitals, pain with positioning. When appropriate will recommend proceed with instrumental testing. Continue NPO, oral care and RN reports he is receiving Cortrak tube today. ST to continue.    HPI HPI: Pt is a 74 yo male s/p anterior cervical discetomy at C3/4 and C5/6 following removal of existing plate, admitted afterwards for sudden onset weakness and pain in his LUE. CT unremarkable; MRI pending. On 12/11 he was made NPO for difficulty swallowing. On 12/11 he underwent C3-6 posterior decompressive laminectomies during which he was intubated. PMH includes: thyroid ca, prostate ca, HTN, HLD, dyspnea, depression, COPD, CHF, anxiety      SLP Plan  Continue with current plan of care       Recommendations  Diet recommendations: NPO Medication Administration: Via alternative means                Oral Care Recommendations: Oral care QID Follow up Recommendations:  (TBD) SLP Visit Diagnosis: Dysphagia, unspecified (R13.10) Plan: Continue with current plan of care       GO                Houston Siren 10/29/2020, 9:44 AM    Orbie Pyo Colvin Caroli.Ed  Risk analyst [phone] Office [phone]

## 2020-10-29 NOTE — Progress Notes (Signed)
Patient ID: [name], male   DOB: 1945-12-02, 74 y.o.   MRN: [id number] BP (!) 123/59   Pulse 77   Temp 98.8 F (37.1 C) (Oral)   Resp 14   SpO2 94%  Events of yesterday noted. Continues to be very lethargic. Hyperpathic left upper extremity. Moving left upper extremity better. Wounds are clean, dry, and without signs of infection. Will continue to monitor closely in the unit.  Not clear why ng tube not placed yesterday when ordered.

## 2020-10-29 NOTE — Evaluation (Signed)
Physical Therapy Evaluation Patient Details Name: [name] [name] MRN: [id number] DOB: 06-Nov-1946 Today's Date: 10/29/2020   History of Present Illness  Patient is a 74 y/o male who presents s/p C3-4, 4-5 ACDF 12/10 with post op LUE weakness requiring C3-6 posterior decompressive lami 56/31 complicated by post op hypoxemia. PMH includes COPD, prostata CA, HTN, depression, CHF.  Clinical Impression  Patient presents with pain, generalized weakness, lethargy, decreased activity tolerance, decreased cardiorespiratory status and impaired mobility s/p above. Pt lives at home alone and reports being Mod I for ADLs; has 2 caregivers 3 days/week who assist with IADls. Reports no falls and uses 2 canes for ambulation. Today, mobility limited due to hypersensitive pain to light touch, lethargy and soft BP. Sp02 dropped to 87% on 10L/min 02 [place] with rolling. Requires MAx A of 2 to roll in both directions to change saturated bed sheets. Would benefit from CIR to maximize independence and mobility prior to return home. Will follow acutely.    Follow Up Recommendations CIR;Supervision for mobility/OOB;Supervision/Assistance - 24 hour    Equipment Recommendations  None recommended by PT    Recommendations for Other Services Rehab consult     Precautions / Restrictions Precautions Precautions: Fall;Other (comment) Precaution Comments: JP drain, watch BP Restrictions Weight Bearing Restrictions: No      Mobility  Bed Mobility Overal bed mobility: Needs Assistance Bed Mobility: Rolling Rolling: Max assist         General bed mobility comments: Assist to roll in both directions using pad, difficulty rolling towards left due to pain in LUE/neck.    Transfers                 General transfer comment: Deferred  Ambulation/Gait             General Gait Details: Deferred  Stairs            Wheelchair Mobility    Modified Rankin (Stroke Patients Only)       Balance        Sitting balance - Comments: Not able to tolerate today due to pain and too lethargic                                     Pertinent Vitals/Pain Pain Assessment: Faces Faces Pain Scale: Hurts whole lot Pain Location: LUE/neck with light touch Pain Descriptors / Indicators: Tingling;Guarding;Grimacing;Moaning Pain Intervention(s): Monitored during session;Limited activity within patient's tolerance;Repositioned;PCA encouraged    Home Living Family/patient expects to be discharged to:: Private residence Living Arrangements: Alone Available Help at Discharge: Personal care attendant;Available PRN/intermittently Type of Home: House Home Access: Stairs to enter Entrance Stairs-Rails: None Entrance Stairs-Number of Steps: 2 Home Layout: Two level;Able to live on main level with bedroom/bathroom Home Equipment: Kasandra Knudsen - single point;Bedside commode;Shower seat - built in;Walker - 2 wheels      Prior Function Level of Independence: Needs assistance   Gait / Transfers Assistance Needed: Uses 2 canes to get around, reports no falls.  ADL's / Homemaking Assistance Needed: Does own ADLs. Has 2 caregivers that come 3 days/week for 8-9 hours daily to do IADLs.        Hand Dominance   Dominant Hand: Right    Extremity/Trunk Assessment   Upper Extremity Assessment Upper Extremity Assessment: RUE deficits/detail;LUE deficits/detail RUE Deficits / Details: numbness in hand, grossly ~4/5 throughout RUE: Unable to fully assess due to pain LUE Deficits / Details:  numbness in hand and tingling sensation with light touch; limited PROM/AROM due to pain and intolerance to light touch LUE Sensation: decreased light touch    Lower Extremity Assessment Lower Extremity Assessment: Generalized weakness;LLE deficits/detail (Grossly ~3+-4/5 throughout) LLE Sensation: decreased light touch    Cervical / Trunk Assessment Cervical / Trunk Assessment: Other exceptions Cervical /  Trunk Exceptions: s/p neck surgery  Communication   Communication: No difficulties  Cognition Arousal/Alertness: Awake/alert;Lethargic Behavior During Therapy: WFL for tasks assessed/performed Overall Cognitive Status: Impaired/Different from baseline Area of Impairment: Orientation;Attention;Following commands;Problem solving                 Orientation Level: Disoriented to;Place Current Attention Level: Focused;Sustained   Following Commands: Follows one step commands with increased time     Problem Solving: Slow processing;Decreased initiation;Requires verbal cues;Requires tactile cues General Comments: At first thinks he is at home; not able to recall he needs to push his PCA pump for pain meds.      General Comments General comments (skin integrity, edema, etc.): Sp02 dropping to 87% on 10L/min 02 [place] with rolling. BP soft.    Exercises     Assessment/Plan    PT Assessment Patient needs continued PT services  PT Problem List Pain;Decreased strength;Decreased range of motion;Decreased mobility;Decreased safety awareness;Obesity;Decreased knowledge of precautions;Cardiopulmonary status limiting activity;Decreased skin integrity;Decreased activity tolerance;Decreased balance;Impaired sensation;Decreased cognition       PT Treatment Interventions Therapeutic activities;Gait training;Therapeutic exercise;Patient/family education;DME instruction;Balance training;Neuromuscular re-education;Functional mobility training;Stair training    PT Goals (Current goals can be found in the Care Plan section)  Acute Rehab PT Goals Patient Stated Goal: to get better PT Goal Formulation: With patient Time For Goal Achievement: 11/12/20 Potential to Achieve Goals: Fair    Frequency Min 5X/week   Barriers to discharge Decreased caregiver support      Co-evaluation               AM-PAC PT "6 Clicks" Mobility  Outcome Measure Help needed turning from your back to your side  while in a flat bed without using bedrails?: Total Help needed moving from lying on your back to sitting on the side of a flat bed without using bedrails?: Total Help needed moving to and from a bed to a chair (including a wheelchair)?: Total Help needed standing up from a chair using your arms (e.g., wheelchair or bedside chair)?: Total Help needed to walk in hospital room?: Total Help needed climbing 3-5 steps with a railing? : Total 6 Click Score: 6    End of Session Equipment Utilized During Treatment: Oxygen Activity Tolerance: Patient limited by pain Patient left: in bed;with call bell/phone within reach;with bed alarm set Nurse Communication: Mobility status      Time: 3154-0086 PT Time Calculation (min) (ACUTE ONLY): 34 min   Charges:   PT Evaluation $PT Eval Moderate Complexity: 1 Mod PT Treatments $Therapeutic Activity: 8-22 mins        Marisa Severin, PT, DPT Acute Rehabilitation Services Pager [phone] Office [phone]      Marguarite Arbour A Sabra Heck 10/29/2020, 2:57 PM

## 2020-10-29 NOTE — Procedures (Signed)
Cortrak  Tube Type:  Cortrak - 43 inches Tube Location:  Left nare Initial Placement:  Stomach Secured by: Bridle Technique Used to Measure Tube Placement:  Documented cm marking at nare/ corner of mouth Cortrak Secured At:  69 cm    Cortrak Tube Team Note:  Consult received to place a Cortrak feeding tube.   No x-ray is required. RN may begin using tube.   If the tube becomes dislodged please keep the tube and contact the Cortrak team at [email or web address] (password [email or web address]) for replacement.  If after hours and replacement cannot be delayed, place a NG tube and confirm placement with an abdominal x-ray.    Koleen Distance MS, RD, LDN Please refer to [facility] for RD and/or RD on-call/weekend/after hours pager

## 2020-10-29 NOTE — Evaluation (Signed)
Occupational Therapy Evaluation Patient Details Name: [name] [name] MRN: [id number] DOB: Feb 01, 1946 Today's Date: 10/29/2020    History of Present Illness Patient is a 74 y/o male who presents s/p C3-4, 4-5 ACDF 12/10 with post op LUE weakness requiring C3-6 posterior decompressive lami 50/53 complicated by post op hypoxemia. PMH includes COPD, prostata CA, HTN, depression, CHF.   Clinical Impression   PTA, pt was living alone and had an aide who assisted with BADLs as needed and performed IADLs. Pt currently requiring Max-Total A for ADLs and Max A +2 for bed mobility. Pt presenting with increased pain at LUE, poor arousal, and decreased strength and balance. Pt agreeable to sit at EOB despite fatigue and pain. VSS on 10L and BP soft. Pt would benefit from further acute OT to facilitate safe dc. Recommend dc to CIR for further OT to optimize safety, independence with ADLs, and return to PLOF.     Follow Up Recommendations  CIR    Equipment Recommendations  3 in 1 bedside commode    Recommendations for Other Services PT consult     Precautions / Restrictions Precautions Precautions: Fall;Other (comment) Precaution Comments: JP drain, watch BP Restrictions Weight Bearing Restrictions: No      Mobility Bed Mobility Overal bed mobility: Needs Assistance Bed Mobility: Rolling;Sidelying to Sit;Sit to Sidelying Rolling: Mod assist Sidelying to sit: Max assist     Sit to sidelying: Max assist;+2 for physical assistance General bed mobility comments: Mod A for rolling to right with cues for using LLE to push. Max A for bringing Bles over EOB and elefvate trunk. Max A +2 for return to sidlying and supine with assist from RN    Transfers                 General transfer comment: Defer for safety    Balance Overall balance assessment: Needs assistance Sitting-balance support: Feet supported;No upper extremity supported Sitting balance-Leahy Scale: Fair Sitting balance -  Comments: Able to sit at EOB with Min Guard A. fatigues quickly                                   ADL either performed or assessed with clinical judgement   ADL Overall ADL's : Needs assistance/impaired Eating/Feeding: NPO   Grooming: Wash/dry face;Min guard;Bed level Grooming Details (indicate cue type and reason): Pt washing his face while at bed level. Falling asleep during task and requiring cues to attend and continue Upper Body Bathing: Maximal assistance;Sitting   Lower Body Bathing: Total assistance;Sitting/lateral leans;Bed level   Upper Body Dressing : Maximal assistance;Sitting   Lower Body Dressing: Total assistance;Bed level;Sitting/lateral leans                 General ADL Comments: Pt requiring Max-Total A for ADLs. Limited by pain, arousal, and poor activity tolerance     Vision         Perception     Praxis      Pertinent Vitals/Pain Pain Assessment: 0-10 Pain Score: 7  Pain Location: LUE/neck with light touch Pain Descriptors / Indicators: Tingling;Guarding;Grimacing;Moaning Pain Intervention(s): Monitored during session;Limited activity within patient's tolerance;Repositioned     Hand Dominance Right   Extremity/Trunk Assessment Upper Extremity Assessment Upper Extremity Assessment: LUE deficits/detail;RUE deficits/detail RUE Deficits / Details: numbness in hand, grossly ~4/5 throughout LUE Deficits / Details: numbness in hand and painful sensation with light touch; limited PROM/AROM due to pain and  intolerance to light touch LUE Sensation: decreased light touch LUE Coordination: decreased fine motor;decreased gross motor   Lower Extremity Assessment Lower Extremity Assessment: Defer to PT evaluation   Cervical / Trunk Assessment Cervical / Trunk Assessment: Other exceptions Cervical / Trunk Exceptions: s/p neck surgery   Communication Communication Communication: No difficulties   Cognition Arousal/Alertness:  Lethargic Behavior During Therapy: WFL for tasks assessed/performed Overall Cognitive Status: Impaired/Different from baseline Area of Impairment: Orientation;Attention;Following commands;Problem solving                 Orientation Level: Disoriented to;Place Current Attention Level: Focused;Sustained   Following Commands: Follows one step commands with increased time     Problem Solving: Slow processing;Decreased initiation;Requires verbal cues;Requires tactile cues General Comments: Pt lethargic and drowsy throughout session. Despite this, pt very agreeable to therapy and attempting to sit at EOB. Pt requiring increased time and cues throughout.   General Comments  SpO2 stable on 10L via HFNC. HR 90s. BP soft    Exercises     Shoulder Instructions      Home Living Family/patient expects to be discharged to:: Private residence Living Arrangements: Alone Available Help at Discharge: Personal care attendant;Available PRN/intermittently Type of Home: House Home Access: Stairs to enter [employer] of Steps: 2 Entrance Stairs-Rails: None Home Layout: Two level;Able to live on main level with bedroom/bathroom Alternate Level Stairs-Number of Steps: 1 flight   Bathroom Shower/Tub: Occupational psychologist: Standard     Home Equipment: Cane - single point;Bedside commode;Shower seat - built in;Walker - 2 wheels          Prior Functioning/Environment Level of Independence: Needs assistance  Gait / Transfers Assistance Needed: Uses 2 canes to get around, reports no falls. ADL's / Homemaking Assistance Needed: Does own ADLs. Has 2 caregivers that come 3 days/week for 8-9 hours daily to do IADLs. Drives short distances            OT Problem List: Decreased strength;Decreased range of motion;Impaired balance (sitting and/or standing);Decreased activity tolerance;Decreased safety awareness;Decreased knowledge of use of DME or AE;Decreased knowledge of  precautions;Decreased cognition;Pain;Impaired UE functional use      OT Treatment/Interventions: Self-care/ADL training;Therapeutic exercise;Energy conservation;DME and/or AE instruction;Therapeutic activities;Patient/family education    OT Goals(Current goals can be found in the care plan section) Acute Rehab OT Goals Patient Stated Goal: to get better OT Goal Formulation: With patient Time For Goal Achievement: 11/12/20 Potential to Achieve Goals: Good  OT Frequency: Min 2X/week   Barriers to D/C:            Co-evaluation              AM-PAC OT "6 Clicks" Daily Activity     Outcome Measure Help from another person eating meals?: Total Help from another person taking care of personal grooming?: A Little Help from another person toileting, which includes using toliet, bedpan, or urinal?: A Lot Help from another person bathing (including washing, rinsing, drying)?: A Lot Help from another person to put on and taking off regular upper body clothing?: A Lot Help from another person to put on and taking off regular lower body clothing?: Total 6 Click Score: 11   End of Session Nurse Communication: Mobility status  Activity Tolerance: Patient limited by pain;Patient limited by lethargy Patient left: in bed;with bed alarm set;with nursing/sitter in room  OT Visit Diagnosis: Unsteadiness on feet (R26.81);Other abnormalities of gait and mobility (R26.89);Muscle weakness (generalized) (M62.81);Pain Pain - Right/Left: Left Pain -  part of body: Arm                Time: 1515-1540 OT Time Calculation (min): 25 min Charges:  OT General Charges $OT Visit: 1 Visit OT Evaluation $OT Eval Moderate Complexity: 1 Mod OT Treatments $Self Care/Home Management : 8-22 mins  Illene Sweeting MSOT, OTR/L Acute Rehab Pager: [phone] Office: [place] 10/29/2020, 6:28 PM

## 2020-10-29 NOTE — Consult Note (Signed)
NAME:  [name] [name], MRN:  [id number], DOB:  1946-09-21, LOS: 3 ADMISSION DATE:  10/26/2020, CONSULTATION DATE:  10/28/20 REFERRING MD:  Sherley Bounds, MD CHIEF COMPLAINT:  Acute hypoxemic respiratory failure  Brief History   74 year old s/p posterior cervical laminectomy  History of present illness   [name] [name] is a 74 year old male with cervical stenosis. Admitted for ACDF on 12/10 and developed LUE weakness post-op requiring posterior cervical decompression on 12/11. PCCM called on 12/12 for post-op hypoxemia. Yesterday was on RA and needed supplemental O2 this morning which has now increased to NRB. Given lasix 20 mg. He is on NIV at home for OSA. Has not worn device since his first surgery.  Past Medical History  Cervical stenosis, chronic diastolic heart failure, OSA, COPD, neuropathy  Significant Hospital Events   12/10 ACDF C3/4 C5/6 12/11 Posterior cervical decompression C3-6 12/12 PCCM consulted for post-op hypoxemia  Consults:  NSG PCCM  Procedures:    Significant Diagnostic Tests:   MRI cord signal change C3-4 C5-6 CXR 10/28/20 - Left atelectasis CXR 12/13> increasing L opacity -- effusion vs obstructive atelectasis   Micro Data:  SARS Covid 12/11 - neg  Antimicrobials:  12/12 Unasyn >   Interim history/subjective:  NAEO  Continues on 6L HFNC   Is having significant pain to light touch L shoulder/neck/arm   Objective   Blood pressure (!) 163/83, pulse 73, temperature 98.2 F (36.8 C), temperature source Axillary, resp. rate 16, SpO2 90 %.        Intake/Output Summary (Last 24 hours) at 10/29/2020 0805 Last data filed at 10/29/2020 0700 Gross per 24 hour  Intake 3025.47 ml  Output 1060 ml  Net 1965.47 ml   There were no vitals filed for this visit.  Physical Exam: General: WDWN older adult F reclined in bed in mild distress due to pain.  HENT:NCAT 6LNC + BD Nebulizer. Anicteric sclera. Neck incision c/d/well approximated. L JP with serosang  output  Respiratory: Faint wheeze. Scattered rhonchi. Diminished L basilar sounds Cardiovascular: rrr s1s2 no rgm cap refill brisk  GI: Soft round ndnt  Extremities: no obvious deformity, no cyanosis or clubbing no edema  Neuro: AAOx4 following commands. Pupils 27mm reactive Skin: c/d/w/i Psych: Appropriate for age and situation. Calm, cooperative   Resolved Hospital Problem list     Assessment & Plan:   Acute hypoxemic respiratory failure secondary to left pleural effusion and atelectasis Possible mucus plug, aspiration OSA on home CPAP Unable to tolerate BiPAP inpt due to neck incision P --High risk for intubation, requires ongoing ICU level care/monitoring of airway status  --Unasyn for possible aspiration --HHFNC, SpO2 goal > 90%  --Aggressive pulmonary hygiene: IS, Flutter, CPT, scheduled nebs -- lasix 40mg  BID --NPO sips with meds   Presumed COPD (no PFTs) -spiriva, symbicort  --Scheduled bronchodilators  Chronic diastolic heart failure --Diuresis as above  S/p posterior laminectomy C3-C6 -prior ACDF C3/4 C5/6 P -post op per NSGY -Dilaudid PCA  -300mg  Gabapentin TID   Best practice (evaluated daily)   Diet: NPO, sips with meds  Pain/Anxiety/Delirium protocol (if indicated): dilaudid PCA  VAP protocol (if indicated): -- DVT prophylaxis: None. Check with NSG in am when to restart GI prophylaxis: PPI Glucose control: CBG q4h Mobility: BR last date of multidisciplinary goals of care discussion N/A Family and staff present N/A Summary of discussion N/A Follow up goals of care discussion due Code Status: Full code.  Disposition: ICU  Labs   CBC: Recent Labs  Lab 10/27/20 2109 10/29/20 0645  WBC  --  17.4*  NEUTROABS  --  14.5*  HGB 13.3 13.4  HCT 39.0 38.7*  MCV  --  90.0  PLT  --  768    Basic Metabolic Panel: Recent Labs  Lab 10/27/20 2109 10/29/20 0645  NA 135 137  K 4.4 3.7  CL 98 99  CO2  --  28  GLUCOSE 151* 129*  BUN 19 20   CREATININE 0.80 0.81  CALCIUM  --  8.8*  MG  --  2.0  PHOS  --  2.1*   GFR: Estimated Creatinine Clearance: 104.3 mL/min (by C-G formula based on SCr of 0.81 mg/dL). Recent Labs  Lab 10/29/20 0645  WBC 17.4*    Liver Function Tests: No results for input(s): AST, ALT, ALKPHOS, BILITOT, PROT, ALBUMIN in the last 168 hours. No results for input(s): LIPASE, AMYLASE in the last 168 hours. No results for input(s): AMMONIA in the last 168 hours.  ABG    Component Value Date/Time   HCO3 56.4 (H) 02/07/2018 1140   TCO2 28 10/27/2020 2109   O2SAT 97.1 02/07/2018 1140     Coagulation Profile: No results for input(s): INR, PROTIME in the last 168 hours.  Cardiac Enzymes: No results for input(s): CKTOTAL, CKMB, CKMBINDEX, TROPONINI in the last 168 hours.  HbA1C: Hgb A1c MFr Bld  Date/Time Value Ref Range Status  10/26/2020 06:35 PM 6.9 (H) 4.8 - 5.6 % Final    Comment:    (NOTE) Pre diabetes:          5.7%-6.4%  Diabetes:              >6.4%  Glycemic control for   <7.0% adults with diabetes     CBG: Recent Labs  Lab 10/28/20 1528 10/28/20 2042 10/28/20 2339 10/29/20 0356 10/29/20 0754  GLUCAP 116* 140* 146* 129* 124*    CRITICAL CARE Performed by: Cristal Generous   Total critical care time: 37 minutes  Critical care time was exclusive of separately billable procedures and treating other patients.  Critical care was necessary to treat or prevent imminent or life-threatening deterioration.  Critical care was time spent personally by me on the following activities: development of treatment plan with patient and/or surrogate as well as nursing, discussions with consultants, evaluation of patient's response to treatment, examination of patient, obtaining history from patient or surrogate, ordering and performing treatments and interventions, ordering and review of laboratory studies, ordering and review of radiographic studies, pulse oximetry and re-evaluation of  patient's condition.  Eliseo Gum MSN, AGACNP-BC [place] [phone] If no answer, [phone] 10/29/2020, 8:06 AM

## 2020-10-29 NOTE — Progress Notes (Signed)
Initial Nutrition Assessment  DOCUMENTATION CODES:   Not applicable  INTERVENTION:   Initiate tube feeding via Cortrak tube: Pivot 1.5 at 65 ml/h (1560 ml per day)  Provides 2340 kcal, 146 gm protein, 1184 ml free water daily  MVI with minerals daily   NUTRITION DIAGNOSIS:   Inadequate oral intake related to inability to eat as evidenced by NPO status.  GOAL:   Patient will meet greater than or equal to 90% of their needs  MONITOR:   Diet advancement,TF tolerance  REASON FOR ASSESSMENT:   Consult Enteral/tube feeding initiation and management  ASSESSMENT:   Pt with PMH of cervial stenosis, CHF, OSA, and COPD admitted for ACDF on 12/10.   Pt discussed during ICU rounds and with RN.  Pt requiring HFNC with 8L O2. Per MD high risk for intubation.  Pt sleepy during visit. Spoke with caregiver. He reports no recent weight loss and good appetite PTA. Pt did have mobility issues PTA.   12/10 ACDF 12/11 s/p decompressive laminectomies  12/12 pt with hypoxia due to left pleural effusion and atelectasis    Medications reviewed and include: vitamin D3, colace, lasix, SSI, glucophage, KCl  Labs reviewed: PO4: 2.1 CBG's: 146-129-124    NUTRITION - FOCUSED PHYSICAL EXAM:  Flowsheet Row Most Recent Value  Orbital Region No depletion  Upper Arm Region No depletion  Thoracic and Lumbar Region No depletion  Buccal Region No depletion  Clavicle Bone Region No depletion  Clavicle and Acromion Bone Region No depletion  Scapular Bone Region Unable to assess  Dorsal Hand No depletion  Patellar Region No depletion  Anterior Thigh Region No depletion  Posterior Calf Region No depletion  Edema (RD Assessment) Mild  Hair Reviewed  Eyes Reviewed  Mouth Reviewed  Skin Reviewed  Nails Reviewed       Diet Order:   Diet Order            Diet NPO time specified Except for: Sips with Meds  Diet effective now                 EDUCATION NEEDS:   No education needs  have been identified at this time  Skin:  Skin Assessment: Reviewed RN Assessment (incisions)  Last BM:  12/7  Height:   Ht Readings from Last 1 Encounters:  10/16/20 6\' 2"  (1.88 m)    Weight:   Wt Readings from Last 1 Encounters:  10/16/20 114.2 kg    Ideal Body Weight:  86.3 kg  BMI:  There is no height or weight on file to calculate BMI.  Estimated Nutritional Needs:   Kcal:  2300-2500  Protein:  130-150 grams  Fluid:  2 L/day  Lockie Pares., RD, LDN, CNSC See AMiON for contact information

## 2020-10-29 NOTE — TOC Initial Note (Signed)
Transition of Care [facility]) - Initial/Assessment Note    Patient Details  Name: [name] [name] MRN: [id number] Date of Birth: 1946/09/12  Transition [facility] Care [facility]) CM/SW Contact:    Ella Bodo, RN Phone Number: 10/29/2020, 1200 Clinical Narrative:  Patient is a 74 y/o male who presents s/p C3-4, 4-5 ACDF 12/10 with post op LUE weakness requiring C3-6 posterior decompressive lami 41/93 complicated by post op hypoxemia. PTA, pt required assistance with ADLs; has caretakers that can provide 24h care at discharge.  Spoke with caretaker Eulas Post at bedside; he states that family likely to prefer SNF in [place] as sister lives there and cannot travel to [place]. Will follow for PT/OT evaluations.                   Expected Discharge Plan: Skilled Nursing Facility Barriers to Discharge: Continued Medical Work up   Patient Goals and CMS Choice        Expected Discharge Plan and Services Expected Discharge Plan: [place]   Discharge Planning Services: CM Consult   Living arrangements for the past 2 months: Single Family Home                                      Prior Living Arrangements/Services Living arrangements for the past 2 months: Single Family Home Lives with:: Other (Comment) (Caretakers) Patient language and need for interpreter reviewed:: Yes Do you feel safe going back to the place where you live?: Yes      Need for Family Participation in Patient Care: Yes (Comment) Care giver support system in place?: Yes (comment)   Criminal Activity/Legal Involvement Pertinent to Current Situation/Hospitalization: No - Comment as needed  Activities of Daily Living      Permission Sought/Granted                  Emotional Assessment Appearance:: Appears stated age Attitude/Demeanor/Rapport: Engaged Affect (typically observed): Accepting Orientation: : Oriented to Self,Oriented to Place,Oriented to Situation      Admission  diagnosis:  Acute cervical radiculopathy [M54.12] Patient Active Problem List   Diagnosis Date Noted  . Left cervical radiculopathy 10/26/2020  . Acute cervical radiculopathy 10/26/2020  . Pressure injury of skin 10/26/2020  . Problems with swallowing and mastication   . Stricture and stenosis of esophagus   . Hx of colonic polyps   . Polyp of ascending colon   . Bilateral carpal tunnel syndrome 02/13/2020  . 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 Lymphedema 02/04/2019  . Tobacco use 02/03/2019  . CHF (congestive heart failure) ([place]) 12/23/2018  . Acute on chronic diastolic CHF (congestive heart failure) ([place]) 11/27/2018  . Cellulitis 11/27/2018  . Hypothyroidism 11/27/2018  . OSA (obstructive sleep apnea) 11/27/2018  . HTN (hypertension) 11/27/2018  . HLD (hyperlipidemia) 11/27/2018  . Protein-calorie malnutrition ([place]) 03/02/2018  . Neuropathy 03/02/2018  . 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  . Difficulty in voiding 03/01/2018  . Vitamin D deficiency 03/01/2018  . Vitamin B12 deficiency 03/01/2018  . Insomnia 03/01/2018  . COPD (chronic obstructive pulmonary disease) ([place]) 02/11/2018  . Pain due to onychomycosis of nail 02/11/2018  . Onychomycosis 02/11/2018  . Traumatic ecchymosis of foot, right, initial encounter 02/11/2018  . Xerostomia 02/11/2018  . Frequent falls 02/11/2018  . 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   PCP:  Idelle Crouch, MD Pharmacy:   [place], [place] -  [address] [place] [address] Phone: [phone] Fax: [phone]     Social Determinants of Health (SDOH) Interventions    Readmission Risk Interventions No flowsheet data found.   Reinaldo Raddle, RN, BSN  Trauma/Neuro ICU Case Manager [phone]

## 2020-10-30 DIAGNOSIS — R208 Other disturbances of skin sensation: Secondary | ICD-10-CM

## 2020-10-30 LAB — BASIC METABOLIC PANEL
Anion gap: 13 (ref 5–15)
BUN: 33 mg/dL — ABNORMAL HIGH (ref 8–23)
CO2: 26 mmol/L (ref 22–32)
Calcium: 8.9 mg/dL (ref 8.9–10.3)
Chloride: 102 mmol/L (ref 98–111)
Creatinine, Ser: 0.91 mg/dL (ref 0.61–1.24)
GFR, Estimated: >60 mL/min (ref >60)
Glucose, Bld: 170 mg/dL — ABNORMAL HIGH (ref 70–99)
Potassium: 3.7 mmol/L (ref 3.5–5.1)
Sodium: 141 mmol/L (ref 135–145)

## 2020-10-30 LAB — CBC
HCT: 38.9 % — ABNORMAL LOW (ref 39.0–52.0)
Hemoglobin: 12.8 g/dL — ABNORMAL LOW (ref 13.0–17.0)
MCH: 30.3 pg (ref 26.0–34.0)
MCHC: 32.9 g/dL (ref 30.0–36.0)
MCV: 92.2 fL (ref 80.0–100.0)
Platelets: 198 10*3/uL (ref 150–400)
RBC: 4.22 MIL/uL (ref 4.22–5.81)
RDW: 13 % (ref 11.5–15.5)
WBC: 14.8 10*3/uL — ABNORMAL HIGH (ref 4.0–10.5)
nRBC: 0 % (ref 0.0–0.2)

## 2020-10-30 LAB — GLUCOSE, CAPILLARY
Glucose-Capillary: 155 mg/dL — ABNORMAL HIGH (ref 70–99)
Glucose-Capillary: 156 mg/dL — ABNORMAL HIGH (ref 70–99)
Glucose-Capillary: 168 mg/dL — ABNORMAL HIGH (ref 70–99)
Glucose-Capillary: 194 mg/dL — ABNORMAL HIGH (ref 70–99)
Glucose-Capillary: 196 mg/dL — ABNORMAL HIGH (ref 70–99)
Glucose-Capillary: 206 mg/dL — ABNORMAL HIGH (ref 70–99)

## 2020-10-30 LAB — MAGNESIUM
Magnesium: 2.2 mg/dL (ref 1.7–2.4)
Magnesium: 2.2 mg/dL (ref 1.7–2.4)

## 2020-10-30 LAB — PHOSPHORUS
Phosphorus: 1.7 mg/dL — ABNORMAL LOW (ref 2.5–4.6)
Phosphorus: 1.7 mg/dL — ABNORMAL LOW (ref 2.5–4.6)

## 2020-10-30 MED ORDER — CHLORHEXIDINE GLUCONATE 0.12 % MT SOLN
15.0000 mL | Freq: Two times a day (BID) | OROMUCOSAL | Status: DC
  Administered 2020-10-30 – 2020-11-08 (×19): 15 mL via OROMUCOSAL
  Filled 2020-10-30 (×9): qty 15

## 2020-10-30 MED ORDER — DIPHENHYDRAMINE HCL 12.5 MG/5ML PO ELIX
12.5000 mg | ORAL_SOLUTION | Freq: Four times a day (QID) | ORAL | Status: DC | PRN
  Filled 2020-10-30: qty 5

## 2020-10-30 MED ORDER — ORAL CARE MOUTH RINSE
15.0000 mL | Freq: Two times a day (BID) | OROMUCOSAL | Status: DC
  Administered 2020-10-30 – 2020-11-07 (×17): 15 mL via OROMUCOSAL

## 2020-10-30 MED ORDER — OXYCODONE HCL 5 MG/5ML PO SOLN
5.0000 mg | ORAL | Status: DC | PRN
  Administered 2020-10-31 – 2020-11-01 (×3): 10 mg
  Administered 2020-11-01 (×4): 5 mg
  Administered 2020-11-02: 10 mg
  Administered 2020-11-02: 5 mg
  Administered 2020-11-02 – 2020-11-05 (×9): 10 mg
  Administered 2020-11-05 – 2020-11-06 (×2): 5 mg
  Administered 2020-11-06: 10 mg
  Administered 2020-11-06: 5 mg
  Administered 2020-11-07 (×2): 10 mg
  Administered 2020-11-08: 5 mg
  Administered 2020-11-08 – 2020-11-09 (×4): 10 mg
  Administered 2020-11-09 (×2): 5 mg
  Administered 2020-11-10: 10 mg
  Administered 2020-11-10 – 2020-11-12 (×2): 5 mg
  Administered 2020-11-13 (×3): 10 mg
  Filled 2020-10-30: qty 5
  Filled 2020-10-30: qty 10
  Filled 2020-10-30: qty 5
  Filled 2020-10-30: qty 10
  Filled 2020-10-30: qty 5
  Filled 2020-10-30: qty 10
  Filled 2020-10-30: qty 5
  Filled 2020-10-30: qty 10
  Filled 2020-10-30: qty 5
  Filled 2020-10-30 (×7): qty 10
  Filled 2020-10-30: qty 5
  Filled 2020-10-30 (×2): qty 10
  Filled 2020-10-30: qty 5
  Filled 2020-10-30 (×3): qty 10
  Filled 2020-10-30 (×2): qty 5
  Filled 2020-10-30 (×4): qty 10
  Filled 2020-10-30: qty 5
  Filled 2020-10-30 (×4): qty 10
  Filled 2020-10-30 (×3): qty 5
  Filled 2020-10-30: qty 10
  Filled 2020-10-30: qty 5

## 2020-10-30 MED ORDER — DIAZEPAM 1 MG/ML PO SOLN
5.0000 mg | Freq: Four times a day (QID) | ORAL | Status: DC | PRN
  Administered 2020-10-30 – 2020-11-05 (×5): 5 mg
  Filled 2020-10-30 (×5): qty 5

## 2020-10-30 MED ORDER — OXYCODONE HCL 5 MG/5ML PO SOLN: 10.0000 mg | ORAL | Status: DC | PRN

## 2020-10-30 MED ORDER — FENTANYL 25 MCG/HR TD PT72: 1.0000 | MEDICATED_PATCH | TRANSDERMAL | Status: DC

## 2020-10-30 MED ORDER — DIPHENHYDRAMINE HCL 50 MG/ML IJ SOLN: 12.5000 mg | Freq: Four times a day (QID) | INTRAMUSCULAR | Status: DC | PRN

## 2020-10-30 NOTE — Progress Notes (Signed)
Notified by nursing staff regarding drop in O2 sat while giving bath. Now requiring 15L non-rebreather from 8L earlier this morning. Appear comfortable on exam. Did received a dose from PCA prior to drop in O2 sat. O2 sat maintaining in low 88-90. Advised to administer Narcan if worsening respiratory status.

## 2020-10-30 NOTE — Progress Notes (Signed)
Physical Therapy Treatment Patient Details Name: [name] [name] MRN: [id number] DOB: May 11, 1946 Today's Date: 10/30/2020    History of Present Illness Patient is a 74 y/o male who presents s/p C3-4, 4-5 ACDF 12/10 with post op LUE weakness requiring C3-6 posterior decompressive lami 33/00 complicated by post op hypoxemia. PMH includes COPD, prostata CA, HTN, depression, CHF.    PT Comments    Patient progressing well towards PT goals. Reports improved pain in LUE today. Requires Max A of 2 for bed mobility with step by step cues for sequencing. Requires Max A of 2 for standing from elevated bed height with difficulty getting fully upright. Pt continues to be lethargic during session but able to converse and participate requiring increased time and cues. VSS on 8L HFNC. Will continue to follow and progress.   Follow Up Recommendations  CIR;Supervision for mobility/OOB;Supervision/Assistance - 24 hour     Equipment Recommendations  None recommended by PT    Recommendations for Other Services       Precautions / Restrictions Precautions Precautions: Fall;Other (comment) Precaution Comments: JP drain Restrictions Weight Bearing Restrictions: No    Mobility  Bed Mobility Overal bed mobility: Needs Assistance Bed Mobility: Rolling;Sidelying to Sit;Sit to Sidelying Rolling: Mod assist Sidelying to sit: Max assist;HOB elevated     Sit to sidelying: Max assist;+2 for physical assistance General bed mobility comments: Step by step cues for log roll technique, assist with LEs, rolling and to elevate trunk to get to EOB. Mod A to roll.  Transfers Overall transfer level: Needs assistance Equipment used: Rolling walker (2 wheeled) Transfers: Sit to/from Stand;Lateral/Scoot Transfers Sit to Stand: Max assist;+2 physical assistance;From elevated surface        Lateral/Scoot Transfers: Min guard;Min assist General transfer comment: Assist of 2 to stand from EOB x3 with flexed  hip/knees. Able to get more upright on 3rd attempt, other reps only half way up despite verbal and manual cues for extension throughout. Able to scoot along side bed x2 with increased time with Min gaurd-Min A  Ambulation/Gait             General Gait Details: Unable   Stairs             Wheelchair Mobility    Modified Rankin (Stroke Patients Only)       Balance Overall balance assessment: Needs assistance Sitting-balance support: Feet supported;No upper extremity supported Sitting balance-Leahy Scale: Fair Sitting balance - Comments: Able to sit at EOB with Min Guard A. fatigues quickly   Standing balance support: During functional activity Standing balance-Leahy Scale: Zero Standing balance comment: Requires external support for upright, limited standing tolerance. Able to move LUE on walker without much pain.                            Cognition Arousal/Alertness: Lethargic Behavior During Therapy: WFL for tasks assessed/performed Overall Cognitive Status: Impaired/Different from baseline Area of Impairment: Attention;Following commands;Problem solving                   Current Attention Level: Sustained   Following Commands: Follows one step commands with increased time     Problem Solving: Slow processing;Decreased initiation;Requires verbal cues;Requires tactile cues;Difficulty sequencing General Comments: Pt lethargic and drowsy throughout session but able to follow commands and converse appropriately. Pt requiring increased time and cues throughout esp for sequencing.      Exercises      General Comments General comments (skin  integrity, edema, etc.): Sp02 stable on 8L HFNC.      Pertinent Vitals/Pain Pain Assessment: Faces Faces Pain Scale: Hurts whole lot Pain Location: LUE, neck Pain Descriptors / Indicators: Tingling;Guarding;Grimacing Pain Intervention(s): Monitored during session;Repositioned;PCA encouraged    Home  Living                      Prior Function            PT Goals (current goals can now be found in the care plan section) Progress towards PT goals: Progressing toward goals    Frequency    Min 5X/week      PT Plan Current plan remains appropriate    Co-evaluation              AM-PAC PT "6 Clicks" Mobility   Outcome Measure  Help needed turning from your back to your side while in a flat bed without using bedrails?: A Lot Help needed moving from lying on your back to sitting on the side of a flat bed without using bedrails?: Total Help needed moving to and from a bed to a chair (including a wheelchair)?: Total Help needed standing up from a chair using your arms (e.g., wheelchair or bedside chair)?: Total Help needed to walk in hospital room?: Total Help needed climbing 3-5 steps with a railing? : Total 6 Click Score: 7    End of Session Equipment Utilized During Treatment: Oxygen;Gait belt Activity Tolerance: Patient limited by fatigue;Patient tolerated treatment well Patient left: in bed;with call bell/phone within reach;with bed alarm set;with SCD's reapplied Nurse Communication: Mobility status;Need for lift equipment PT Visit Diagnosis: Pain Pain - Right/Left: Left Pain - part of body: Arm (neck)     Time: 0936-1000 PT Time Calculation (min) (ACUTE ONLY): 24 min  Charges:  $Therapeutic Activity: 23-37 mins                     Marisa Severin, PT, DPT Acute Rehabilitation Services Pager [phone] Office [place] 10/30/2020, 12:39 PM

## 2020-10-30 NOTE — Progress Notes (Signed)
Patient ID: [name] [name], male   DOB: 05-24-1946, 74 y.o.   MRN: [id number] BP (!) 171/72   Pulse 80   Temp 98.5 F (36.9 C) (Axillary)   Resp (!) 23   SpO2 90%  Lethargic, following commands Moving all extremities Weaker left upper extremity than right Poor oxygenation.  Removed jp drain this afternoon.

## 2020-10-30 NOTE — Progress Notes (Addendum)
Spo2 95% on 8L HFNC. Patient given bath, and noted with drop in O2 sat to 85%.  HFNC increased to 15L with sats up to 88%.  NRB mask applied, spo2 slowly increased to 89-90% Patient currently resting comfortably post bath with HOB elevated, spo2 91% RR 18 at present.  RT aware, resident on unit and made aware.   6:36 PM  Patient sating 95% on NRB and 15L HFNC.  Attempted to remove NRB. Patient desated to 87% on HFNC alone. NRB put back on. RT recommending heated HF. MD paged and aware.

## 2020-10-30 NOTE — Progress Notes (Addendum)
NAME:  [name] [name], MRN:  [id number], DOB:  September 19, 1946, LOS: 4 ADMISSION DATE:  10/26/2020, CONSULTATION DATE:  10/28/20 REFERRING MD:  Sherley Bounds, MD CHIEF COMPLAINT:  Acute hypoxemic respiratory failure  Brief History   74 year old s/p posterior cervical laminectomy  History of present illness   [name] is a 74 year old male with cervical stenosis. Admitted for ACDF on 12/10 and developed LUE weakness post-op requiring posterior cervical decompression on 12/11. PCCM called on 12/12 for post-op hypoxemia. Yesterday was on RA and needed supplemental O2 this morning which has now increased to NRB. Given lasix 20 mg. He is on NIV at home for OSA. Has not worn device since his first surgery.  Past Medical History  Cervical stenosis, chronic diastolic heart failure, OSA, COPD, neuropathy  Significant Hospital Events   12/10 ACDF C3/4 C5/6 12/11 Posterior cervical decompression C3-6 12/12 PCCM consulted for post-op hypoxemia  Consults:  NSG PCCM  Procedures:    Significant Diagnostic Tests:   MRI cord signal change C3-4 C5-6 CXR 10/28/20 - Left atelectasis CXR 12/13> increasing L opacity -- effusion vs obstructive atelectasis   Micro Data:  SARS Covid 12/11 - neg  Antimicrobials:  12/12 Unasyn >   Interim history/subjective:   NAEO  8L HFNC this morning with SpO2 95%  AM BMP pending AM CBC with decrease in WBC (down to 14.8)   Continues to have significant LUE pain   Objective   Blood pressure 133/64, pulse 74, temperature 99.1 F (37.3 C), temperature source Axillary, resp. rate (!) 21, SpO2 91 %.    FiO2 (%):  [93 %] 93 %   Intake/Output Summary (Last 24 hours) at 10/30/2020 0719 Last data filed at 10/30/2020 0646 Gross per 24 hour  Intake 1879.87 ml  Output 1120 ml  Net 759.87 ml   There were no vitals filed for this visit.  Physical Exam: General: WDWN older adult M, ill appearing, NAD  HENT: NCAT 8L HFNC + bronchodilator neb. Anicteric  sclera. Cortrak. JP drain with small amount of bloody output. Surgical incisions c/d/well approximated  Respiratory: Symmetrical chest expansion, no accessory use. Diminished bibasilar sounds. Scattered rhonchi.  Cardiovascular: RRR s1s2 no rgm. Cap refill < 3 seconds  GI: Soft, round, ndnt + bowel sounds  Extremities: No obvious joint deformity. No cyanosis or clubbing. Slight LUE edema compared to RUE  Neuro: Drowsy, Oriented x 4. Following  Commands. LUE allodynia. Limited LUE ROM due to pain.  Skin: c/d/w Psych: Calm, cooperative, appropriate insight for age and situation.   Resolved Hospital Problem list     Assessment & Plan:   Acute hypoxemic respiratory failure secondary to left pleural effusion and atelectasis Possible mucus plug, aspiration OSA on home CPAP Unable to tolerate BiPAP inpt due to neck incision P -High risk for intubation, requires ongoing ICU level care/monitoring of airway status  -Unasyn for possible aspiration -HHFNC, SpO2 goal > 90%  -Pulm hygiene, IS, Flutter, CPT, tcdb (at times we are limited by pain)   Presumed COPD (no PFTs) -spiriva, symbicort  -Scheduled bronchodilators  Chronic diastolic heart failure -Diuresis as above  S/p posterior laminectomy C3-C6 -prior ACDF C3/4 C5/6 LUE Allodynia -onset 12/10 post op. Associated weakness at time of onset.  -12/14 continues to have LUE wkness, significant pain, some mild edema  -- I am not sure how acutely CRPS can onset, seems to have some overlap in sx but is certainly early in post op period.  P -post op per  NSGY -Dilaudid PCA  -300mg  Gabapentin TID -- pending course may need to consider increasing vs additional AED for neuropathic pain.   Leukocytosis -suspect largely in setting of steroids +/- infectious component with suspected aspiration  P -cont to trend WBC, fever curve -Unasyn as above   Inadequate PO intake -cortrak -EN per RDN  DM -metformin -SSI  HTN HLD -labetalol,  cozaar, lipitor, lasix   Best practice (evaluated daily)   Diet: EN Pain/Anxiety/Delirium protocol (if indicated): dilaudid PCA  VAP protocol (if indicated): -- DVT prophylaxis: SCDs -- chemical vte ppx per NSGY  GI prophylaxis: PPI Glucose control: CBG q4h Mobility: BR last date of multidisciplinary goals of care discussion N/A Family and staff present N/A Summary of discussion N/A Follow up goals of care discussion due Code Status: Full code.  Disposition: ICU  Labs   CBC: Recent Labs  Lab 10/27/20 2109 10/29/20 0645  WBC  --  17.4*  NEUTROABS  --  14.5*  HGB 13.3 13.4  HCT 39.0 38.7*  MCV  --  90.0  PLT  --  944    Basic Metabolic Panel: Recent Labs  Lab 10/27/20 2109 10/29/20 0645 10/29/20 1705  NA 135 137  --   K 4.4 3.7  --   CL 98 99  --   CO2  --  28  --   GLUCOSE 151* 129*  --   BUN 19 20  --   CREATININE 0.80 0.81  --   CALCIUM  --  8.8*  --   MG  --  2.0 2.0  PHOS  --  2.1* 3.2   GFR: Estimated Creatinine Clearance: 104.3 mL/min (by C-G formula based on SCr of 0.81 mg/dL). Recent Labs  Lab 10/29/20 0645  WBC 17.4*    Liver Function Tests: No results for input(s): AST, ALT, ALKPHOS, BILITOT, PROT, ALBUMIN in the last 168 hours. No results for input(s): LIPASE, AMYLASE in the last 168 hours. No results for input(s): AMMONIA in the last 168 hours.  ABG    Component Value Date/Time   HCO3 56.4 (H) 02/07/2018 1140   TCO2 28 10/27/2020 2109   O2SAT 97.1 02/07/2018 1140     Coagulation Profile: No results for input(s): INR, PROTIME in the last 168 hours.  Cardiac Enzymes: No results for input(s): CKTOTAL, CKMB, CKMBINDEX, TROPONINI in the last 168 hours.  HbA1C: Hgb A1c MFr Bld  Date/Time Value Ref Range Status  10/26/2020 06:35 PM 6.9 (H) 4.8 - 5.6 % Final    Comment:    (NOTE) Pre diabetes:          5.7%-6.4%  Diabetes:              >6.4%  Glycemic control for   <7.0% adults with diabetes     CBG: Recent Labs  Lab  10/29/20 1148 10/29/20 1615 10/29/20 1948 10/29/20 2344 10/30/20 0348  GLUCAP 138* 150* 201* 180* 156*    CRITICAL CARE Performed by: Cristal Generous   Total critical care time: 38 minutes  Critical care time was exclusive of separately billable procedures and treating other patients.  Critical care was necessary to treat or prevent imminent or life-threatening deterioration.  Critical care was time spent personally by me on the following activities: development of treatment plan with patient and/or surrogate as well as nursing, discussions with consultants, evaluation of patient's response to treatment, examination of patient, obtaining history from patient or surrogate, ordering and performing treatments and interventions, ordering and review of laboratory studies,  ordering and review of radiographic studies, pulse oximetry and re-evaluation of patient's condition.  Eliseo Gum MSN, AGACNP-BC [place] [phone] If no answer, [phone] 10/30/2020, 7:19 AM

## 2020-10-30 NOTE — Progress Notes (Signed)
CPT not done at this time due to pain.

## 2020-10-30 NOTE — Progress Notes (Signed)
Inpatient Rehab Admissions Coordinator Note:   Per therapy recommendations, pt was screened for CIR candidacy by Shann Medal, PT, DPT.  At this time we are recommending a CIR consult.  If pt would like to be considered, please place an IP Rehab MD consult.  Please contact me with questions.   Shann Medal, PT, DPT [phone] 10/30/20 1:07 PM

## 2020-10-31 ENCOUNTER — Inpatient Hospital Stay (HOSPITAL_COMMUNITY): Payer: Medicare Other

## 2020-10-31 LAB — BASIC METABOLIC PANEL
Anion gap: 9 (ref 5–15)
BUN: 27 mg/dL — ABNORMAL HIGH (ref 8–23)
CO2: 31 mmol/L (ref 22–32)
Calcium: 8.4 mg/dL — ABNORMAL LOW (ref 8.9–10.3)
Chloride: 107 mmol/L (ref 98–111)
Creatinine, Ser: 0.88 mg/dL (ref 0.61–1.24)
GFR, Estimated: >60 mL/min (ref >60)
Glucose, Bld: 100 mg/dL — ABNORMAL HIGH (ref 70–99)
Potassium: 3 mmol/L — ABNORMAL LOW (ref 3.5–5.1)
Sodium: 147 mmol/L — ABNORMAL HIGH (ref 135–145)

## 2020-10-31 LAB — CBC
HCT: 36.1 % — ABNORMAL LOW (ref 39.0–52.0)
Hemoglobin: 11.7 g/dL — ABNORMAL LOW (ref 13.0–17.0)
MCH: 30.1 pg (ref 26.0–34.0)
MCHC: 32.4 g/dL (ref 30.0–36.0)
MCV: 92.8 fL (ref 80.0–100.0)
Platelets: 199 10*3/uL (ref 150–400)
RBC: 3.89 MIL/uL — ABNORMAL LOW (ref 4.22–5.81)
RDW: 13.3 % (ref 11.5–15.5)
WBC: 17.8 10*3/uL — ABNORMAL HIGH (ref 4.0–10.5)
nRBC: 0 % (ref 0.0–0.2)

## 2020-10-31 LAB — MAGNESIUM: Magnesium: 2.3 mg/dL (ref 1.7–2.4)

## 2020-10-31 LAB — GLUCOSE, CAPILLARY
Glucose-Capillary: 122 mg/dL — ABNORMAL HIGH (ref 70–99)
Glucose-Capillary: 160 mg/dL — ABNORMAL HIGH (ref 70–99)
Glucose-Capillary: 172 mg/dL — ABNORMAL HIGH (ref 70–99)
Glucose-Capillary: 198 mg/dL — ABNORMAL HIGH (ref 70–99)
Glucose-Capillary: 200 mg/dL — ABNORMAL HIGH (ref 70–99)
Glucose-Capillary: 212 mg/dL — ABNORMAL HIGH (ref 70–99)

## 2020-10-31 LAB — PHOSPHORUS: Phosphorus: 1.5 mg/dL — ABNORMAL LOW (ref 2.5–4.6)

## 2020-10-31 MED ORDER — POTASSIUM CHLORIDE 20 MEQ PO PACK
20.0000 meq | PACK | Freq: Once | ORAL | Status: AC
  Administered 2020-10-31: 10:00:00 20 meq
  Filled 2020-10-31: qty 1

## 2020-10-31 MED ORDER — GERHARDT'S BUTT CREAM
TOPICAL_CREAM | Freq: Three times a day (TID) | CUTANEOUS | Status: DC | PRN
  Administered 2020-11-28: 1 via TOPICAL
  Filled 2020-10-31 (×3): qty 1

## 2020-10-31 MED ORDER — POTASSIUM PHOSPHATES 15 MMOLE/5ML IV SOLN
30.0000 mmol | Freq: Once | INTRAVENOUS | Status: AC
  Administered 2020-10-31: 08:00:00 30 mmol via INTRAVENOUS
  Filled 2020-10-31: qty 10

## 2020-10-31 MED ORDER — FUROSEMIDE 10 MG/ML IJ SOLN
20.0000 mg | Freq: Once | INTRAMUSCULAR | Status: AC
  Administered 2020-10-31: 10:00:00 20 mg via INTRAVENOUS
  Filled 2020-10-31: qty 2

## 2020-10-31 NOTE — Progress Notes (Signed)
  Speech Language Pathology Treatment: Dysphagia  Patient Details Name: [name] [name] MRN: [id number] DOB: [birth year] Today's Date: 10/31/2020 Time: 9611-6435 SLP Time Calculation (min) (ACUTE ONLY): 15 min  Assessment / Plan / Recommendation Clinical Impression  Pt more awake this afternoon and sitting upright in bed on arrival with vitals stable and caregiver at bedside. With pt's permission therapist reviewed ST intervention thus far. Caregiver has been cleaning mouth and there is no mucous however has lingual candidias. Subjectively, his external neck edema appears somewhat lessened from 12/13. + aspiration is highly suspected with tsp water. He produced reflexive cough after sub swallows. Given he is tolerating upright position SLP will check pt and if appears appropriate may recommend proceeding with MBS in afternoon. Suspect edema may obstruct adequate view if FEES performed.     HPI HPI: Pt is a 74 yo male s/p anterior cervical discetomy at C3/4 and C5/6 following removal of existing plate, admitted afterwards for sudden onset weakness and pain in his LUE. CT unremarkable; MRI pending. On 12/11 he was made NPO for difficulty swallowing. On 12/11 he underwent C3-6 posterior decompressive laminectomies during which he was intubated. PMH includes: thyroid ca, prostate ca, HTN, HLD, dyspnea, depression, COPD, CHF, anxiety      SLP Plan  Continue with current plan of care       Recommendations  Diet recommendations: NPO Medication Administration: Via alternative means                Oral Care Recommendations: Oral care QID Follow up Recommendations: Skilled Nursing facility Plan: Continue with current plan of care                      Houston Siren 10/31/2020, 2:33 PM  Orbie Pyo Colvin Caroli.Ed Risk analyst [phone] Office [phone]

## 2020-10-31 NOTE — Progress Notes (Signed)
CPT not done at this time. Patient is sleeping.

## 2020-10-31 NOTE — Progress Notes (Signed)
Patient ID: [name] [name], male   DOB: January 17, 1946, 74 y.o.   MRN: [id number] BP (!) 144/73   Pulse 72   Temp (!) 100.5 F (38.1 C) (Axillary)   Resp 20   SpO2 90%  Lethargic, following all commands. Moving left upper extremity better. Not oxygenating well,  Wounds are clean and dry

## 2020-10-31 NOTE — Progress Notes (Signed)
Bedside ultrasound performed to assess for pleural effusion  He does have a small effusion on the left side, not enough for any intervention  Chest x-ray performed at 1218 hrs. reveals improvement in atelectasis compared with prior on the 13th  Encouraged to use flutter valve Clearance of secretions as able

## 2020-10-31 NOTE — Progress Notes (Signed)
Occupational Therapy Treatment Patient Details Name: [name] [name] MRN: [id number] DOB: Dec 15, 1945 Today's Date: 10/31/2020    History of present illness Patient is a 74 y/o male who presents s/p C3-4, 4-5 ACDF 12/10 with post op LUE weakness requiring C3-6 posterior decompressive lami 94/70 complicated by post op hypoxemia. PMH includes COPD, prostata CA, HTN, depression, CHF.   OT comments  Pt lethargic today and with delirium. He tolerated EOB but requiring heavy two person assist for transitions and up to maxA for static balance EOB. Pt with minimal command follow but does occasionally respond with yes/no questions. Pt on heated HFNC (25L, 90%) with SpO2 >/=94% and remaining VSS. Will continue per POC at this time.    Follow Up Recommendations  CIR    Equipment Recommendations  3 in 1 bedside commode          Precautions / Restrictions Precautions Precautions: Fall       Mobility Bed Mobility Overal bed mobility: Needs Assistance Bed Mobility: Supine to Sit;Sit to Supine     Supine to sit: Max assist;+2 for physical assistance Sit to supine: Total assist;+2 for physical assistance   General bed mobility comments: pt assisted up via R elbow with use of the pad.  Pt very lethargic and slow to follow instruction.  Transfers                 General transfer comment: Deferred standing today due to pt too lethargic and delirious.    Balance Overall balance assessment: Needs assistance Sitting-balance support: Feet supported;No upper extremity supported Sitting balance-Leahy Scale: Poor Sitting balance - Comments: pt sat EOB at min to min guard.  No overt truncal activity noted.                                   ADL either performed or assessed with clinical judgement   ADL Overall ADL's : Needs assistance/impaired                                       General ADL Comments: today pt is totalA for ADL                        Cognition Arousal/Alertness: Lethargic Behavior During Therapy: Flat affect Overall Cognitive Status: Impaired/Different from baseline                   Orientation Level: Situation Current Attention Level: Focused   Following Commands: Follows one step commands with increased time;Follows one step commands inconsistently     Problem Solving: Slow processing;Decreased initiation;Requires verbal cues;Requires tactile cues;Difficulty sequencing General Comments: no command follow today; occasionally stating yes/no to questions        Exercises Exercises: Other exercises Other Exercises Other Exercises: warm up PROM to Bil LE and UE's   Shoulder Instructions       General Comments SpO2 hovered around 94% on 25L and 90% FiO2 heated HFNC, otherwise VSS    Pertinent Vitals/ Pain       Pain Assessment: Faces Faces Pain Scale: Hurts little more Pain Location: generalized with mobility Pain Descriptors / Indicators: Guarding;Moaning Pain Intervention(s): Monitored during session;Repositioned  Home Living  Prior Functioning/Environment              Frequency  Min 2X/week        Progress Toward Goals  OT Goals(current goals can now be found in the care plan section)  Progress towards OT goals: OT to reassess next treatment  Acute Rehab OT Goals Patient Stated Goal: to get better OT Goal Formulation: With patient Time For Goal Achievement: 11/12/20 Potential to Achieve Goals: Good  Plan Discharge plan remains appropriate    Co-evaluation    PT/OT/SLP Co-Evaluation/Treatment: Yes Reason for Co-Treatment: For patient/therapist safety;To address functional/ADL transfers PT goals addressed during session: Mobility/safety with mobility OT goals addressed during session: Strengthening/ROM      AM-PAC OT "6 Clicks" Daily Activity     Outcome Measure   Help from another person eating meals?:  Total Help from another person taking care of personal grooming?: Total Help from another person toileting, which includes using toliet, bedpan, or urinal?: Total Help from another person bathing (including washing, rinsing, drying)?: Total Help from another person to put on and taking off regular upper body clothing?: Total Help from another person to put on and taking off regular lower body clothing?: Total 6 Click Score: 6    End of Session Equipment Utilized During Treatment: Oxygen  OT Visit Diagnosis: Unsteadiness on feet (R26.81);Other abnormalities of gait and mobility (R26.89);Muscle weakness (generalized) (M62.81);Pain Pain - Right/Left: Left Pain - part of body: Arm   Activity Tolerance Patient limited by fatigue;Patient limited by lethargy   Patient Left in bed;with call bell/phone within reach;with bed alarm set   Nurse Communication Mobility status        Time: 1532-1600 OT Time Calculation (min): 28 min  Charges: OT General Charges $OT Visit: 1 Visit OT Treatments $Therapeutic Activity: 8-22 mins  Lou Cal, OT Acute Rehabilitation Services Pager [phone] Office [place] 10/31/2020, 6:04 PM

## 2020-10-31 NOTE — Progress Notes (Signed)
Physical Therapy Treatment Patient Details Name: [name] [name] MRN: [id number] DOB: 08-Aug-1946 Today's Date: 10/31/2020    History of Present Illness Patient is a 74 y/o male who presents s/p C3-4, 4-5 ACDF 12/10 with post op LUE weakness requiring C3-6 posterior decompressive lami 30/86 complicated by post op hypoxemia. PMH includes COPD, prostata CA, HTN, depression, CHF.    PT Comments    Session today was limited by low arousal level and overall delirium.  Emphasis on warm up for stiffness and to help arouse before mobility, transition toe EOB, sitting EOB working on balance and truncal activation.  Sit to stand and transfers deferred due to arousal level.    Follow Up Recommendations  CIR;Supervision for mobility/OOB;Supervision/Assistance - 24 hour     Equipment Recommendations  None recommended by PT    Recommendations for Other Services       Precautions / Restrictions Precautions Precautions: Fall    Mobility  Bed Mobility Overal bed mobility: Needs Assistance Bed Mobility: Supine to Sit;Sit to Supine     Supine to sit: Max assist;+2 for physical assistance Sit to supine: Total assist;+2 for physical assistance   General bed mobility comments: pt assisted up via R elbow with use of the pad.  Pt very lethargic and slow to follow instruction.  Transfers                 General transfer comment: Deferred standing today due to pt too lethargic and delerious.  Ambulation/Gait             General Gait Details: Unable   Stairs             Wheelchair Mobility    Modified Rankin (Stroke Patients Only)       Balance Overall balance assessment: Needs assistance Sitting-balance support: Feet supported;No upper extremity supported Sitting balance-Leahy Scale: Poor Sitting balance - Comments: pt sat EOB at min to min guard.  No overt truncal activity noted.                                    Cognition Arousal/Alertness:  Lethargic Behavior During Therapy: Flat affect Overall Cognitive Status: Impaired/Different from baseline                   Orientation Level: Situation Current Attention Level: Focused   Following Commands: Follows one step commands with increased time;Follows one step commands inconsistently     Problem Solving: Slow processing;Decreased initiation;Requires verbal cues;Requires tactile cues;Difficulty sequencing        Exercises Other Exercises Other Exercises: warm up PROM to Bil LE and UE's    General Comments General comments (skin integrity, edema, etc.): SpO2 hovered around 94% on 25L and 90% FiO2 o/w VSS      Pertinent Vitals/Pain Pain Assessment: Faces Faces Pain Scale: Hurts a little bit Pain Location: generalized Pain Descriptors / Indicators: Guarding;Moaning Pain Intervention(s): Monitored during session;Limited activity within patient's tolerance    Home Living                      Prior Function            PT Goals (current goals can now be found in the care plan section) Acute Rehab PT Goals Patient Stated Goal: to get better PT Goal Formulation: With patient Time For Goal Achievement: 11/12/20 Potential to Achieve Goals: Fair Progress towards PT goals: Progressing toward  goals (limited by delirium)    Frequency    Min 5X/week      PT Plan Current plan remains appropriate    Co-evaluation PT/OT/SLP Co-Evaluation/Treatment: Yes Reason for Co-Treatment: For patient/therapist safety;Complexity of the patient's impairments (multi-system involvement) PT goals addressed during session: Mobility/safety with mobility        AM-PAC PT "6 Clicks" Mobility   Outcome Measure  Help needed turning from your back to your side while in a flat bed without using bedrails?: Total Help needed moving from lying on your back to sitting on the side of a flat bed without using bedrails?: Total Help needed moving to and from a bed to a chair  (including a wheelchair)?: Total Help needed standing up from a chair using your arms (e.g., wheelchair or bedside chair)?: Total Help needed to walk in hospital room?: Total Help needed climbing 3-5 steps with a railing? : Total 6 Click Score: 6    End of Session Equipment Utilized During Treatment: Oxygen;Gait belt Activity Tolerance: Patient limited by fatigue Patient left: in bed;with call bell/phone within reach;with bed alarm set;with SCD's reapplied Nurse Communication: Mobility status;Need for lift equipment PT Visit Diagnosis: Other abnormalities of gait and mobility (R26.89);Pain Pain - part of body:  (general, arm)     Time: 1532-1600 PT Time Calculation (min) (ACUTE ONLY): 28 min  Charges:  $Therapeutic Activity: 8-22 mins                     10/31/2020  [name], PT Acute Rehabilitation Services [phone]  (pager) [phone]  (office)   Tessie Fass Annisa Mazzarella 10/31/2020, 4:59 PM

## 2020-10-31 NOTE — Progress Notes (Signed)
Witnessed RT, Collie Siad, educate pt caregiver on use of Flutter Valve. Caregiver has since assisted pt in use multiple times.

## 2020-10-31 NOTE — Progress Notes (Signed)
NAME:  [name] [name], MRN:  [id number], DOB:  11-04-1946, LOS: 5 ADMISSION DATE:  10/26/2020, CONSULTATION DATE:  10/28/20 REFERRING MD:  Sherley Bounds, MD CHIEF COMPLAINT:  Acute hypoxemic respiratory failure  Brief History   74 year old s/p posterior cervical laminectomy  History of present illness   [name] is a 74 year old male with cervical stenosis. Admitted for ACDF on 12/10 and developed LUE weakness post-op requiring posterior cervical decompression on 12/11. PCCM called on 12/12 for post-op hypoxemia. Yesterday was on RA and needed supplemental O2 this morning which has now increased to NRB. Given lasix 20 mg. He is on NIV at home for OSA. Has not worn device since his first surgery.  Past Medical History  Cervical stenosis, chronic diastolic heart failure, OSA, COPD, neuropathy  Significant Hospital Events   12/10 ACDF C3/4 C5/6 12/11 Posterior cervical decompression C3-6 12/12 PCCM consulted for post-op hypoxemia  Consults:  NSG PCCM  Procedures:    Significant Diagnostic Tests:   MRI cord signal change C3-4 C5-6 CXR 10/28/20 - Left atelectasis CXR 12/13> increasing L opacity -- effusion vs obstructive atelectasis   Micro Data:  SARS Covid 12/11 - neg  Antimicrobials:  12/12 Unasyn >   Interim history/subjective:  JP removed yesterday.   On HF this morning, Sats 92-95%  Seems more confused than yesterday to me. Off of his PCA  Caregiver is at bedside    Objective   Blood pressure (!) 173/118, pulse 85, temperature 99.3 F (37.4 C), temperature source Axillary, resp. rate (!) 25, SpO2 92 %.    FiO2 (%):  [80 %-100 %] 80 %   Intake/Output Summary (Last 24 hours) at 10/31/2020 0725 Last data filed at 10/31/2020 0700 Gross per 24 hour  Intake 3841.35 ml  Output 1815 ml  Net 2026.35 ml   There were no vitals filed for this visit.  Physical Exam: General: WD elderly M, reclined in bed. Uncomfortable appearing HENT: NCAT. HFNC in place. Dry  mm. Cortrak secure L nare. Anicteric sclera.  Respiratory: Symmetrical chest expansion, even and unlabored respirations. Diminished bibasilar sounds. No rhonchi  Cardiovascular: RRR s1s2 no rgm cap refill brisk 2+ pulses  GI: Soft, round, ndnt + bowel sounds  Extremities: No obvious joint deformity. LUE 1/5. No cyanosis or clubbing.  Neuro: LUE allodynia. Drowsy, oriented x2. CAM ICU positive PERRLA 36mm. Following commands Skin: c/d/w. Surgical incisions well approximated  Resolved Hospital Problem list     Assessment & Plan:   Acute encephalopathy -multifactorial ICU delirium, side effects of medications P -delirium precautions. Promote sleep hygiene, awake during the day  -if off PCA   Acute hypoxemic respiratory failure secondary to left pleural effusion and atelectasis Possible mucus plug, aspiration OSA on home CPAP Unable to tolerate BiPAP inpt due to neck incision P -High risk for intubation, requires ongoing ICU level care/monitoring of airway status  -Unasyn for possible aspiration -HHFNC, SpO2 goal > 90%  -Pulm hygiene, IS, Flutter, CPT (at times we are limited by pain)  -Pushing mobility 12/15 -- bed in chair, tcdb -CXR 12/16  Presumed COPD (no PFTs) -spiriva, symbicort  -Scheduled bronchodilators  Chronic diastolic heart failure -BID lasix + 1x extra lasix 12/15  -decreasing mIVF from 110ml /hr to 63ml/hr   S/p posterior laminectomy C3-C6 -prior ACDF C3/4 C5/6 LUE Allodynia -onset 12/10 post op. Associated weakness at time of onset.  -continues to have LUE wkness, significant pain, some mild edema (not sure how acutely CRPS can onset  post op?)  vs: neuropathic pain from above cspine pathology or of other etiology.  12/15 pt more encephalopathic, is harder to discern quality of pain (previously described as burning) as well as severity compared to prior exams.  P -post op per NSGY  -if off PCA. Has PRN oxy -300mg  Gabapentin TID, continue serial assessment of  LUE.  -continues to work with PT/OT   Leukocytosis -possibly multifactorial in setting of steroids +/- infectious component with suspected aspiration  P -cont to trend WBC, fever curve -Unasyn as above   Inadequate PO intake -cortrak -EN per RDN  DM -metformin -SSI  HTN HLD -labetalol, cozaar, lipitor, lasix   Hypokalemia Hypophosphatemia -replace -cont to trend   Best practice (evaluated daily)   Diet: EN Pain/Anxiety/Delirium protocol (if indicated): pRN oxy. TID gaba VAP protocol (if indicated): -- DVT prophylaxis: SCDs -- chemical vte ppx per NSGY  GI prophylaxis: PPI Glucose control: CBG q4h Mobility: BR last date of multidisciplinary goals of care discussion N/A Family and staff present N/A Summary of discussion N/A Follow up goals of care discussion due -- per primary 12/15: PCCM updates provided to caregiver at bedside. There were many questions regarding prognosis from  progress and anticipated rehab from [place] standpoint which I defer to [place] for clarification.   Code Status: Full code.  Disposition: ICU  Labs   CBC: Recent Labs  Lab 10/27/20 2109 10/29/20 0645 10/30/20 0646 10/31/20 0510  WBC  --  17.4* 14.8* 17.8*  NEUTROABS  --  14.5*  --   --   HGB 13.3 13.4 12.8* 11.7*  HCT 39.0 38.7* 38.9* 36.1*  MCV  --  90.0 92.2 92.8  PLT  --  277 198 263    Basic Metabolic Panel: Recent Labs  Lab 10/27/20 2109 10/29/20 0645 10/29/20 1705 10/30/20 0646 10/30/20 1708 10/31/20 0510  NA 135 137  --  141  --  147*  K 4.4 3.7  --  3.7  --  3.0*  CL 98 99  --  102  --  107  CO2  --  28  --  26  --  31  GLUCOSE 151* 129*  --  170*  --  100*  BUN 19 20  --  33*  --  27*  CREATININE 0.80 0.81  --  0.91  --  0.88  CALCIUM  --  8.8*  --  8.9  --  8.4*  MG  --  2.0 2.0 2.2 2.2 2.3  PHOS  --  2.1* 3.2 1.7* 1.7* 1.5*   GFR: CrCl cannot be calculated (Unknown ideal weight.). Recent Labs  Lab 10/29/20 0645 10/30/20 0646 10/31/20 0510  WBC 17.4*  14.8* 17.8*    Liver Function Tests: No results for input(s): AST, ALT, ALKPHOS, BILITOT, PROT, ALBUMIN in the last 168 hours. No results for input(s): LIPASE, AMYLASE in the last 168 hours. No results for input(s): AMMONIA in the last 168 hours.  ABG    Component Value Date/Time   HCO3 56.4 (H) 02/07/2018 1140   TCO2 28 10/27/2020 2109   O2SAT 97.1 02/07/2018 1140     Coagulation Profile: No results for input(s): INR, PROTIME in the last 168 hours.  Cardiac Enzymes: No results for input(s): CKTOTAL, CKMB, CKMBINDEX, TROPONINI in the last 168 hours.  HbA1C: Hgb A1c MFr Bld  Date/Time Value Ref Range Status  10/26/2020 06:35 PM 6.9 (H) 4.8 - 5.6 % Final    Comment:    (NOTE) Pre diabetes:  5.7%-6.4%  Diabetes:              >6.4%  Glycemic control for   <7.0% adults with diabetes     CBG: Recent Labs  Lab 10/30/20 1223 10/30/20 1544 10/30/20 1956 10/30/20 2350 10/31/20 0426  GLUCAP 196* 168* 206* 194* 122*    CRITICAL CARE Performed by: Cristal Generous   Total critical care time: 40 minutes  Critical care time was exclusive of separately billable procedures and treating other patients.  Critical care was necessary to treat or prevent imminent or life-threatening deterioration.  Critical care was time spent personally by me on the following activities: development of treatment plan with patient and/or surrogate as well as nursing, discussions with consultants, evaluation of patient's response to treatment, examination of patient, obtaining history from patient or surrogate, ordering and performing treatments and interventions, ordering and review of laboratory studies, ordering and review of radiographic studies, pulse oximetry and re-evaluation of patient's condition.  Eliseo Gum MSN, AGACNP-BC [place] [phone] If no answer, [phone] 10/31/2020, 10:17 AM

## 2020-11-01 ENCOUNTER — Inpatient Hospital Stay (HOSPITAL_COMMUNITY): Payer: Medicare Other

## 2020-11-01 ENCOUNTER — Encounter (HOSPITAL_COMMUNITY): Payer: Self-pay | Admitting: Neurosurgery

## 2020-11-01 ENCOUNTER — Other Ambulatory Visit: Payer: Self-pay

## 2020-11-01 LAB — PHOSPHORUS: Phosphorus: 2.5 mg/dL (ref 2.5–4.6)

## 2020-11-01 LAB — BASIC METABOLIC PANEL
Anion gap: 15 (ref 5–15)
BUN: 39 mg/dL — ABNORMAL HIGH (ref 8–23)
CO2: 26 mmol/L (ref 22–32)
Calcium: 8.2 mg/dL — ABNORMAL LOW (ref 8.9–10.3)
Chloride: 104 mmol/L (ref 98–111)
Creatinine, Ser: 1.07 mg/dL (ref 0.61–1.24)
GFR, Estimated: >60 mL/min (ref >60)
Glucose, Bld: 181 mg/dL — ABNORMAL HIGH (ref 70–99)
Potassium: 3.2 mmol/L — ABNORMAL LOW (ref 3.5–5.1)
Sodium: 145 mmol/L (ref 135–145)

## 2020-11-01 LAB — GLUCOSE, CAPILLARY
Glucose-Capillary: 140 mg/dL — ABNORMAL HIGH (ref 70–99)
Glucose-Capillary: 179 mg/dL — ABNORMAL HIGH (ref 70–99)
Glucose-Capillary: 172 mg/dL — ABNORMAL HIGH (ref 70–99)
Glucose-Capillary: 109 mg/dL — ABNORMAL HIGH (ref 70–99)
Glucose-Capillary: 229 mg/dL — ABNORMAL HIGH (ref 70–99)

## 2020-11-01 MED ORDER — FUROSEMIDE 10 MG/ML IJ SOLN
40.0000 mg | Freq: Every day | INTRAMUSCULAR | Status: DC
  Administered 2020-11-02 – 2020-11-03 (×2): 40 mg via INTRAVENOUS
  Filled 2020-11-01 (×2): qty 4

## 2020-11-01 MED ORDER — RESOURCE THICKENUP CLEAR PO POWD: ORAL | Status: DC | PRN

## 2020-11-01 MED ORDER — ALBUTEROL SULFATE (2.5 MG/3ML) 0.083% IN NEBU
2.5000 mg | INHALATION_SOLUTION | Freq: Four times a day (QID) | RESPIRATORY_TRACT | Status: DC | PRN
  Administered 2020-11-04: 09:00:00 2.5 mg via RESPIRATORY_TRACT
  Filled 2020-11-01: qty 3

## 2020-11-01 NOTE — Progress Notes (Signed)
Nutrition Follow-up  DOCUMENTATION CODES:   Not applicable  INTERVENTION:   Encourage PO intake on dysphagia diet; not appropriate to remove cortrak at this time   Tube feeding via Cortrak tube: Pivot 1.5 at 65 ml/h (1560 ml per day)  Provides 2340 kcal, 146 gm protein, 1184 ml free water daily  MVI with minerals daily    NUTRITION DIAGNOSIS:   Inadequate oral intake related to inability to eat as evidenced by NPO status. Ongoing.   GOAL:   Patient will meet greater than or equal to 90% of their needs Met with TF.   MONITOR:   Diet advancement,TF tolerance  REASON FOR ASSESSMENT:   Consult Enteral/tube feeding initiation and management  ASSESSMENT:   Pt with PMH of cervial stenosis, CHF, OSA, and COPD admitted for ACDF on 12/10.   Pt discussed during ICU rounds and with RN.  Pt requiring HFNC with 8L O2.    12/10 ACDF 12/11 s/p decompressive laminectomies  12/12 pt with hypoxia due to left pleural effusion and atelectasis    Medications reviewed and include: vitamin D3, colace, lasix, SSI, glucophage, KCl  Labs reviewed: K+ 3.2 CBG's: [phone]     Diet Order:   Diet Order            DIET - DYS 1 Room service appropriate? No; Fluid consistency: Pudding Thick  Diet effective now                 EDUCATION NEEDS:   No education needs have been identified at this time  Skin:  Skin Assessment: Skin Integrity Issues: Skin Integrity Issues:: Stage II Stage II: perineum  Last BM:  12/14  Height:   Ht Readings from Last 1 Encounters:  11/01/20 $RemoveB'6\' 2"'[id number]$  (1.88 m)    Weight:   Wt Readings from Last 1 Encounters:  11/01/20 118 kg    Ideal Body Weight:  86.3 kg  BMI:  Body mass index is 33.4 kg/m.  Estimated Nutritional Needs:   Kcal:  2300-2500  Protein:  130-150 grams  Fluid:  2 L/day  Lockie Pares., RD, LDN, CNSC See [facility] for contact information

## 2020-11-01 NOTE — Progress Notes (Signed)
Pharmacy Antibiotic Note  [name] [name] is a 74 y.o. male admitted on 10/26/2020 with concern for aspiration pneumonia.  Pharmacy has been consulted for Unasyn dosing.  Today is Unasyn D#5, current dosing remains appropriate. Noted fevers, rising WBC, still on HFNC. Will follow along with MD on plans for antibiotics and LOT.  Plan: - Continue Unasyn 3 gm IV Q 6 hours - Will continue to follow renal function, culture results, LOT, and antibiotic de-escalation plans   Height: 6\' 2"  (188 cm) Weight: 118 kg (260 lb 2.3 oz) IBW/kg (Calculated) : 82.2  Temp (24hrs), Avg:99.1 F (37.3 C), Min:98 F (36.7 C), Max:100.5 F (38.1 C)  Recent Labs  Lab 10/27/20 2109 10/29/20 0645 10/30/20 0646 10/31/20 0510  WBC  --  17.4* 14.8* 17.8*  CREATININE 0.80 0.81 0.91 0.88    Estimated Creatinine Clearance: 97.5 mL/min (by C-G formula based on SCr of 0.88 mg/dL).    Allergies  Allergen Reactions  . Ibuprofen     Other reaction(s): Angioedema    Antimicrobials this admission: Unasyn 12/12 >>   Dose adjustments this admission:  Microbiology results: 12/11 COVID >> neg  Thank you for allowing pharmacy to be a part of this patient's care.  Alycia Rossetti, PharmD, BCPS Clinical Pharmacist Clinical phone for 11/01/2020: [phone] 11/01/2020 10:29 AM   **Pharmacist phone directory can now be found on [email or web address] ([email or web address]).  Listed under [place].

## 2020-11-01 NOTE — Progress Notes (Signed)
NAME:  [name] [name], MRN:  [id number], DOB:  Feb 29, 1946, LOS: 6 ADMISSION DATE:  10/26/2020, CONSULTATION DATE:  10/28/20 REFERRING MD:  Sherley Bounds, MD CHIEF COMPLAINT:  Acute hypoxemic respiratory failure  Brief History   74 year old s/p posterior cervical laminectomy  History of present illness   Mr. Jeremih Dearmas is a 74 year old male with cervical stenosis. Admitted for ACDF on 12/10 and developed LUE weakness post-op requiring posterior cervical decompression on 12/11. PCCM called on 12/12 for post-op hypoxemia. Yesterday was on RA and needed supplemental O2 this morning which has now increased to NRB. Given lasix 20 mg. He is on NIV at home for OSA. Has not worn device since his first surgery.  Past Medical History  Cervical stenosis, chronic diastolic heart failure, OSA, COPD, neuropathy  Significant Hospital Events   12/10 ACDF C3/4 C5/6 12/11 Posterior cervical decompression C3-6 12/12 PCCM consulted for post-op hypoxemia  Consults:  NSG PCCM  Procedures:    Significant Diagnostic Tests:   MRI cord signal change C3-4 C5-6 CXR 10/28/20 - Left atelectasis CXR 12/13> increasing L opacity -- effusion vs obstructive atelectasis  Chest x-ray 12/16-left lower lobe atelectasis, small effusion  Micro Data:  SARS Covid 12/11 - neg  Antimicrobials:  12/12 Unasyn >   Interim history/subjective:  JP removed 12/14 Remains on oxygen supplementation Alert and interactive, pain is better  Objective   Blood pressure 140/67, pulse 70, temperature 98 F (36.7 C), temperature source Axillary, resp. rate (!) 22, height 6\' 2"  (1.88 m), weight 118 kg, SpO2 93 %.    FiO2 (%):  [70 %-100 %] 80 %   Intake/Output Summary (Last 24 hours) at 11/01/2020 0825 Last data filed at 11/01/2020 0700 Gross per 24 hour  Intake 2756.58 ml  Output 2300 ml  Net 456.58 ml   Filed Weights   11/01/20 0749  Weight: 118 kg    Physical Exam: General: Elderly gentleman, does not appear to be in  distress  HENT: Moist oral mucosa  Respiratory: Decreased air movement at the base, no rhonchi.  Cardiovascular: S1-S2 appreciated GI: Bowel sounds appreciated Extremities: No edema, no clubbing Neuro: LUE allodynia. Skin: c/d/w. Surgical incisions well approximated  Resolved Hospital Problem list     Assessment & Plan:  Multifactorial encephalopathy Delirium appears to be better -Delirium precautions -Pain control appears to be better  Acute hypoxic respiratory failure Left pleural effusion Atelectasis -Chest x-ray is better -On flutter device use -CPT being performed every 6, will change to every 12 -Nebulization treatment with bronchodilators will switch to as needed  Presumed COPD -On Spiriva, Symbicort -Bronchodilators as needed  Diastolic heart failure -On Lasix -Change Lasix to 40 daily  Status post posterior laminectomy C3-C6 Prior ACDF C3/4 C5/6 Left upper extremity allodynia -Postop management per neurosurgery -Oxy as needed -Gabapentin 3 times daily  Leukocytosis is multifactorial -Stable -On Unasyn for presumed aspiration  Inadequate p.o. intake -Contract in place -On enteric nutrition  Diabetes -On SSI  Hypertension Hyperlipidemia -On labetalol, Cozaar, Lipitor  Best practice (evaluated daily)   Diet: EN Pain/Anxiety/Delirium protocol (if indicated): pRN oxy. TID gaba VAP protocol (if indicated): -- DVT prophylaxis: SCDs -- chemical vte ppx per NSGY  GI prophylaxis: PPI Glucose control: CBG q4h Mobility: BR last date of multidisciplinary goals of care discussion N/A Family and staff present N/A Summary of discussion N/A Follow up goals of care discussion due -- per primary 12/15: PCCM updates provided to caregiver at bedside. There were many questions regarding  prognosis from  progress and anticipated rehab from [place] standpoint which I defer to Val[place] for clarification.   Code Status: Full code.  Disposition: ICU  Labs    CBC: Recent Labs  Lab 10/27/20 2109 10/29/20 0645 10/30/20 0646 10/31/20 0510  WBC  --  17.4* 14.8* 17.8*  NEUTROABS  --  14.5*  --   --   HGB 13.3 13.4 12.8* 11.7*  HCT 39.0 38.7* 38.9* 36.1*  MCV  --  90.0 92.2 92.8  PLT  --  277 198 734    Basic Metabolic Panel: Recent Labs  Lab 10/27/20 2109 10/29/20 0645 10/29/20 1705 10/30/20 0646 10/30/20 1708 10/31/20 0510  NA 135 137  --  141  --  147*  K 4.4 3.7  --  3.7  --  3.0*  CL 98 99  --  102  --  107  CO2  --  28  --  26  --  31  GLUCOSE 151* 129*  --  170*  --  100*  BUN 19 20  --  33*  --  27*  CREATININE 0.80 0.81  --  0.91  --  0.88  CALCIUM  --  8.8*  --  8.9  --  8.4*  MG  --  2.0 2.0 2.2 2.2 2.3  PHOS  --  2.1* 3.2 1.7* 1.7* 1.5*   GFR: Estimated Creatinine Clearance: 97.5 mL/min (by C-G formula based on SCr of 0.88 mg/dL). Recent Labs  Lab 10/29/20 0645 10/30/20 0646 10/31/20 0510  WBC 17.4* 14.8* 17.8*    Liver Function Tests: No results for input(s): AST, ALT, ALKPHOS, BILITOT, PROT, ALBUMIN in the last 168 hours. No results for input(s): LIPASE, AMYLASE in the last 168 hours. No results for input(s): AMMONIA in the last 168 hours.  ABG    Component Value Date/Time   HCO3 56.4 (H) 02/07/2018 1140   TCO2 28 10/27/2020 2109   O2SAT 97.1 02/07/2018 1140     Coagulation Profile: No results for input(s): INR, PROTIME in the last 168 hours.  Cardiac Enzymes: No results for input(s): CKTOTAL, CKMB, CKMBINDEX, TROPONINI in the last 168 hours.  HbA1C: Hgb A1c MFr Bld  Date/Time Value Ref Range Status  10/26/2020 06:35 PM 6.9 (H) 4.8 - 5.6 % Final    Comment:    (NOTE) Pre diabetes:          5.7%-6.4%  Diabetes:              >6.4%  Glycemic control for   <7.0% adults with diabetes     CBG: Recent Labs  Lab 10/31/20 1610 10/31/20 2006 10/31/20 2350 11/01/20 0347 11/01/20 0819  GLUCAP 172* 212* 160* 140* 179*   The patient is critically ill with multiple organ systems failure  and requires high complexity decision making for assessment and support, frequent evaluation and titration of therapies, application of advanced monitoring technologies and extensive interpretation of multiple databases. Critical Care Time devoted to patient care services described in this note independent of APP/resident time (if applicable)  is 30 minutes.   Sherrilyn Rist MD [facility] Personal pager: [phone] If unanswered, please page CCM On-call: [phone]

## 2020-11-01 NOTE — Progress Notes (Signed)
Physical Therapy Treatment Patient Details Name: [name] [name] MRN: [id number] DOB: 10/09/1946 Today's Date: 11/01/2020    History of Present Illness Patient is a 74 y/o male who presents s/p C3-4, 4-5 ACDF 12/10 with post op LUE weakness requiring C3-6 posterior decompressive lami 29/56 complicated by post op hypoxemia. PMH includes COPD, prostata CA, HTN, depression, CHF.    PT Comments    Pt initially lethargic, but became more alert and participative with warm up exercise and transition to sitting.  Emphasis on rolling and transitions, sit to stand into the RW and general sitting EOB.    Follow Up Recommendations  CIR;Supervision for mobility/OOB;Supervision/Assistance - 24 hour     Equipment Recommendations  None recommended by PT    Recommendations for Other Services Rehab consult     Precautions / Restrictions Precautions Precautions: Fall    Mobility  Bed Mobility Overal bed mobility: Needs Assistance Bed Mobility: Rolling;Sidelying to Sit;Sit to Sidelying Rolling: Mod assist Sidelying to sit: Mod assist;+2 for physical assistance;Max assist     Sit to sidelying: Max assist;+2 for physical assistance General bed mobility comments: cues for technique with truncal and LE assist, 2 person assist for both transitions.  Transfers Overall transfer level: Needs assistance Equipment used: Rolling walker (2 wheeled) Transfers: Sit to/from Stand Sit to Stand: Max assist;+2 physical assistance;From elevated surface         General transfer comment: sit to stand x2 to a sub maximal stand with L>R knee not fully extended.  Ambulation/Gait             General Gait Details: Unable   Stairs             Wheelchair Mobility    Modified Rankin (Stroke Patients Only)       Balance Overall balance assessment: Needs assistance Sitting-balance support: Feet supported;No upper extremity supported;Bilateral upper extremity supported Sitting balance-Leahy  Scale: Poor Sitting balance - Comments: improved to min guard for short periods with UE assist     Standing balance-Leahy Scale: Zero Standing balance comment: reliant on external support.                            Cognition Arousal/Alertness: Lethargic;Awake/alert Behavior During Therapy: Flat affect Overall Cognitive Status: Impaired/Different from baseline                   Orientation Level: Situation;Time Current Attention Level: Focused;Sustained   Following Commands: Follows one step commands with increased time;Follows one step commands inconsistently     Problem Solving: Slow processing;Decreased initiation;Requires verbal cues;Requires tactile cues;Difficulty sequencing General Comments: notably improved cognition and following of 1-step commands,.      Exercises Other Exercises Other Exercises: warm up A/ resistive ROM to Bil LE and UE's x7 reps    General Comments General comments (skin integrity, edema, etc.): after standing trials, pt reported he couldn't 'catch" his breath well.  Sats dropped to b/w 86-88% for a short period on 25L HHFNC at 60% FiO2.  Coaching for efficient breathing brought sats to 925 in 1-2 min.      Pertinent Vitals/Pain Pain Assessment: Faces Faces Pain Scale: No hurt Pain Intervention(s): Monitored during session    Home Living                      Prior Function            PT Goals (current goals can now be found  in the care plan section) Acute Rehab PT Goals Patient Stated Goal: to get better PT Goal Formulation: With patient Time For Goal Achievement: 11/12/20 Potential to Achieve Goals: Fair Progress towards PT goals: Progressing toward goals    Frequency    Min 5X/week      PT Plan Current plan remains appropriate    Co-evaluation              AM-PAC PT "6 Clicks" Mobility   Outcome Measure  Help needed turning from your back to your side while in a flat bed without using  bedrails?: A Lot Help needed moving from lying on your back to sitting on the side of a flat bed without using bedrails?: A Lot Help needed moving to and from a bed to a chair (including a wheelchair)?: A Lot Help needed standing up from a chair using your arms (e.g., wheelchair or bedside chair)?: A Lot Help needed to walk in hospital room?: Total   6 Click Score: 9    End of Session   Activity Tolerance: Patient tolerated treatment well;Patient limited by fatigue Patient left: in bed;with call bell/phone within reach;with bed alarm set Nurse Communication: Mobility status;Need for lift equipment PT Visit Diagnosis: Other abnormalities of gait and mobility (R26.89);Pain Pain - Right/Left: Left Pain - part of body: Arm     Time: 1916-6060 PT Time Calculation (min) (ACUTE ONLY): 23 min  Charges:  $Therapeutic Activity: 23-37 mins                     11/01/2020  [name], PT Acute Rehabilitation Services [phone]  (pager) [phone]  (office)   Tessie Fass Varnika Butz 11/01/2020, 5:47 PM

## 2020-11-01 NOTE — Progress Notes (Signed)
Modified Barium Swallow Progress Note  Patient Details  Name: [name] [name] MRN: [id number] Date of Birth: [birth year]  Today's Date: 11/01/2020  Modified Barium Swallow completed.  Full report located under Chart Review in the Imaging Section.  Brief recommendations include the following:  Clinical Impression  Pt exhibited a mild pharyngoesophageal dysphagia s/p ACDF and then required posterior decompression laminectomies. Phayrngeal edema observed however not as significant as anticipated. His anterior hyoid movement is adequate with mildly reduced laryngeal elevation although able to ahcieve epiglottic deflection. Overall, timing of laryngeal protection was good however one instance of  teaspoon honey barium penetrated vestibule (deep) before closure was reached with barium ascending to the top of epiglottis. Residue was min-mild in vallecuale and pyriform sinuses and began to spill over his interarytenoid space. He was sensate with reflexive throat clear then re-swallowed which cleared. Suspect decreased pressures in cervical esophagus as barium passed through UES with partial reflux to pyriform sinuses. Recommend initiate puree (Dys 1), pudding thick liquids, crush pills or via Cortrak. Pt should clear throat throughout meals.   Swallow Evaluation Recommendations       SLP Diet Recommendations: Dysphagia 1 (Puree) solids;Pudding thick liquid       Medication Administration: Crushed with puree   Supervision: Full assist for feeding;Staff to assist with self feeding;Full supervision/cueing for compensatory strategies   Compensations: Slow rate;Small sips/bites   Postural Changes: Seated upright at 90 degrees;Remain semi-upright after after feeds/meals (Comment)   Oral Care Recommendations: Oral care BID   Other Recommendations: Order thickener from pharmacy    Houston Siren 11/01/2020,3:05 PM   Orbie Pyo Colvin Caroli.Ed Risk analyst  [phone] Office [phone]

## 2020-11-01 NOTE — Progress Notes (Signed)
Patient ID: [name] [name], male   DOB: 11-08-1946, 74 y.o.   MRN: [id number] BP 131/66   Pulse 69   Temp 98.6 F (37 C) (Oral)   Resp (!) 21   Ht 6\' 2"  (1.88 m)   Wt 118 kg   SpO2 (!) 86%   BMI 33.40 kg/m  Lethargic, arouses to voice. Moving all extremities Still not oxygenating well.  Wounds are clean, dry, no signs of infection.  Improving overall Remains hyperpathic in left upper extremity

## 2020-11-01 NOTE — Progress Notes (Signed)
Inpatient Rehabilitation Admissions Coordinator                                       Inpatient rehab consult received. Patient not currently at a level that I can assess for rehab. I will follow.  Danne Baxter, RN, MSN Rehab Admissions Coordinator [phone] 11/01/2020 12:24 PM

## 2020-11-01 NOTE — Progress Notes (Signed)
  Speech Language Pathology Treatment:    Patient Details Name: [name] [name] MRN: [id number] DOB: [birth year] Today's Date: 11/01/2020 Time:  -      MBS today at [address] 11/01/2020, 9:58 AM  Orbie Pyo Colvin Caroli.Ed Risk analyst [phone] Office [phone]

## 2020-11-02 ENCOUNTER — Inpatient Hospital Stay (HOSPITAL_COMMUNITY): Payer: Medicare Other

## 2020-11-02 LAB — POCT I-STAT 7, (LYTES, BLD GAS, ICA,H+H)
Acid-Base Excess: 9 mmol/L — ABNORMAL HIGH (ref 0.0–2.0)
Bicarbonate: 34 mmol/L — ABNORMAL HIGH (ref 20.0–28.0)
Calcium, Ion: 1.15 mmol/L (ref 1.15–1.40)
HCT: 30 % — ABNORMAL LOW (ref 39.0–52.0)
Hemoglobin: 10.2 g/dL — ABNORMAL LOW (ref 13.0–17.0)
O2 Saturation: 89 %
Patient temperature: 99.7
Potassium: 4.1 mmol/L (ref 3.5–5.1)
Sodium: 151 mmol/L — ABNORMAL HIGH (ref 135–145)
TCO2: 36 mmol/L — ABNORMAL HIGH (ref 22–32)
pCO2 arterial: 52.6 mmHg — ABNORMAL HIGH (ref 32.0–48.0)
pH, Arterial: 7.421 (ref 7.350–7.450)
pO2, Arterial: 60 mmHg — ABNORMAL LOW (ref 83.0–108.0)

## 2020-11-02 LAB — GLUCOSE, CAPILLARY
Glucose-Capillary: 168 mg/dL — ABNORMAL HIGH (ref 70–99)
Glucose-Capillary: 169 mg/dL — ABNORMAL HIGH (ref 70–99)
Glucose-Capillary: 172 mg/dL — ABNORMAL HIGH (ref 70–99)
Glucose-Capillary: 175 mg/dL — ABNORMAL HIGH (ref 70–99)
Glucose-Capillary: 175 mg/dL — ABNORMAL HIGH (ref 70–99)
Glucose-Capillary: 185 mg/dL — ABNORMAL HIGH (ref 70–99)

## 2020-11-02 LAB — PHOSPHORUS: Phosphorus: 4 mg/dL (ref 2.5–4.6)

## 2020-11-02 LAB — POTASSIUM: Potassium: 4.3 mmol/L (ref 3.5–5.1)

## 2020-11-02 MED ORDER — POTASSIUM PHOSPHATES 15 MMOLE/5ML IV SOLN
20.0000 mmol | Freq: Once | INTRAVENOUS | Status: AC
  Administered 2020-11-02: 11:00:00 20 mmol via INTRAVENOUS
  Filled 2020-11-02: qty 6.67

## 2020-11-02 MED ORDER — HEPARIN SODIUM (PORCINE) 5000 UNIT/ML IJ SOLN
5000.0000 [IU] | Freq: Three times a day (TID) | INTRAMUSCULAR | Status: DC
  Administered 2020-11-02 – 2020-11-29 (×80): 5000 [IU] via SUBCUTANEOUS
  Filled 2020-11-02 (×78): qty 1

## 2020-11-02 MED ORDER — ACETAMINOPHEN 160 MG/5ML PO SOLN
650.0000 mg | Freq: Four times a day (QID) | ORAL | Status: DC | PRN
  Administered 2020-11-02 – 2020-11-09 (×13): 650 mg
  Filled 2020-11-02 (×14): qty 20.3

## 2020-11-02 MED ORDER — POTASSIUM CHLORIDE 20 MEQ PO PACK
40.0000 meq | PACK | Freq: Once | ORAL | Status: AC
  Administered 2020-11-02: 11:00:00 40 meq
  Filled 2020-11-02: qty 2

## 2020-11-02 NOTE — Progress Notes (Signed)
Patient ID: [name] [name], male   DOB: May 19, 1946, 74 y.o.   MRN: [id number] BP 113/66   Pulse 73   Temp 99.7 F (37.6 C) (Axillary)   Resp (!) 29   Ht 6\' 2"  (1.88 m)   Wt 117.5 kg   SpO2 96%   BMI 33.26 kg/m  Lethargic, will follow commands when alert abg shows poor oxygenation PO2 60%, pCO2 52.6%, fiO2 is 60% CCM now aware, will modify treatment Wounds are clean, dry, no signs of infection

## 2020-11-02 NOTE — Progress Notes (Signed)
SLP Cancellation Note  Patient Details Name: [name] [name] MRN: [id number] DOB: 1946-07-22   Cancelled treatment:        Pt on Bipap. Episode of O2 sats in 70's. He initiated puree, pudding thick liquids. RN discontinued diet order. Discussed with RN. Agree with NPO status for now and ST will plan to revisit next week.    Houston Siren 11/02/2020, 4:43 PM  Orbie Pyo Colvin Caroli.Ed Risk analyst [phone] Office [phone]

## 2020-11-02 NOTE — Progress Notes (Signed)
Pt placed on bipap by RT

## 2020-11-02 NOTE — Progress Notes (Signed)
Pt satting 100 on bipap, more alert, moving more. MD Olalere aware. [facility] updated.

## 2020-11-02 NOTE — Progress Notes (Signed)
NAME:  [name] [name], MRN:  [id number], DOB:  October 31, 1946, LOS: 7 ADMISSION DATE:  10/26/2020, CONSULTATION DATE:  10/28/20 REFERRING MD:  Sherley Bounds, MD CHIEF COMPLAINT:  Acute hypoxemic respiratory failure  Brief History   74 year old s/p posterior cervical laminectomy  History of present illness   [name] is a 74 year old male with cervical stenosis. Admitted for ACDF on 12/10 and developed LUE weakness post-op requiring posterior cervical decompression on 12/11. PCCM called on 12/12 for post-op hypoxemia. Yesterday was on RA and needed supplemental O2 this morning which has now increased to NRB. Given lasix 20 mg. He is on NIV at home for OSA. Has not worn device since his first surgery.  Past Medical History  Cervical stenosis, chronic diastolic heart failure, OSA, COPD, neuropathy  Significant Hospital Events   12/10 ACDF C3/4 C5/6 12/11 Posterior cervical decompression C3-6 12/12 PCCM consulted for post-op hypoxemia 12/17-stable state  Consults:  NSG PCCM  Procedures:    Significant Diagnostic Tests:   MRI cord signal change C3-4 C5-6 CXR 10/28/20 - Left atelectasis CXR 12/13> increasing L opacity -- effusion vs obstructive atelectasis  Chest x-ray 12/16-left lower lobe atelectasis, small effusion  Micro Data:  SARS Covid 12/11 - neg  Antimicrobials:  12/12 Unasyn -12/17  Interim history/subjective:  JP removed 12/14 Remains on oxygen supplementation Alert and interactive, pain is better  Did fair on swallowing evaluation  Objective   Blood pressure (!) 107/55, pulse 72, temperature 97.6 F (36.4 C), temperature source Oral, resp. rate 18, height 6\' 2"  (1.88 m), weight 117.5 kg, SpO2 92 %.    FiO2 (%):  [60 %-80 %] 70 %   Intake/Output Summary (Last 24 hours) at 11/02/2020 0909 Last data filed at 11/02/2020 0600 Gross per 24 hour  Intake 1775.32 ml  Output 1250 ml  Net 525.32 ml   Filed Weights   11/01/20 0749 11/02/20 0500  Weight: 118 kg  117.5 kg    Physical Exam: General: Elderly, does not appear to be in distress  HENT: Moist oral mucosa Respiratory: Decreased air movement at the base, no rhonchi  cardiovascular: S1-S2 appreciated GI: Bowel sounds appreciated Extremities: No edema, no clubbing Neuro: LUE allodynia. Skin: c/d/w. Surgical incisions well approximated  Resolved Hospital Problem list     Assessment & Plan:  Multifocal encephalopathy Delirium appears to be better -Delirium precautions -Pain management  Presumed COPD -Continue bronchodilators  Hypoxic respiratory failure Small left pleural effusion Atelectasis -Chest x-ray better and stable -CPT every 12 -Transition to nasal cannula  Diastolic heart failure -On Lasix -Decreased to 40 daily  Status post posterior laminectomy -Left upper extremity allodynia -On OxyIR -On gabapentin  Presumed aspiration -Has had 5 days of Unasyn, will stop today  Inadequate p.o. intake -Keep cortrak in place -Advance diet as tolerated  Diabetes -On SSI  Hypertension Hyperlipidemia -On labetalol, Cozaar, Lipitor  Still with significant risk of decompensation    Best practice (evaluated daily)   Diet: EN Pain/Anxiety/Delirium protocol (if indicated): pRN oxy. TID gaba VAP protocol (if indicated): -- DVT prophylaxis: SCDs -- chemical vte ppx per NSGY  GI prophylaxis: PPI Glucose control: CBG q4h Mobility: BR last date of multidisciplinary goals of care discussion N/A Family and staff present N/A Summary of discussion N/A Follow up goals of care discussion due -- per primary 12/17: Caregiver updated at bedside Code Status: Full code.  Disposition: ICU  Labs   CBC: Recent Labs  Lab 10/27/20 2109 10/29/20 0645 10/30/20 6440  10/31/20 0510  WBC  --  17.4* 14.8* 17.8*  NEUTROABS  --  14.5*  --   --   HGB 13.3 13.4 12.8* 11.7*  HCT 39.0 38.7* 38.9* 36.1*  MCV  --  90.0 92.2 92.8  PLT  --  277 198 373    Basic Metabolic  Panel: Recent Labs  Lab 10/27/20 2109 10/29/20 0645 10/29/20 0645 10/29/20 1705 10/30/20 0646 10/30/20 1708 10/31/20 0510 11/01/20 1047  NA 135 137  --   --  141  --  147* 145  K 4.4 3.7  --   --  3.7  --  3.0* 3.2*  CL 98 99  --   --  102  --  107 104  CO2  --  28  --   --  26  --  31 26  GLUCOSE 151* 129*  --   --  170*  --  100* 181*  BUN 19 20  --   --  33*  --  27* 39*  CREATININE 0.80 0.81  --   --  0.91  --  0.88 1.07  CALCIUM  --  8.8*  --   --  8.9  --  8.4* 8.2*  MG  --  2.0  --  2.0 2.2 2.2 2.3  --   PHOS  --  2.1*   < > 3.2 1.7* 1.7* 1.5* 2.5   < > = values in this interval not displayed.   GFR: Estimated Creatinine Clearance: 80 mL/min (by C-G formula based on SCr of 1.07 mg/dL). Recent Labs  Lab 10/29/20 0645 10/30/20 0646 10/31/20 0510  WBC 17.4* 14.8* 17.8*    Liver Function Tests: No results for input(s): AST, ALT, ALKPHOS, BILITOT, PROT, ALBUMIN in the last 168 hours. No results for input(s): LIPASE, AMYLASE in the last 168 hours. No results for input(s): AMMONIA in the last 168 hours.  ABG    Component Value Date/Time   HCO3 56.4 (H) 02/07/2018 1140   TCO2 28 10/27/2020 2109   O2SAT 97.1 02/07/2018 1140     Coagulation Profile: No results for input(s): INR, PROTIME in the last 168 hours.  Cardiac Enzymes: No results for input(s): CKTOTAL, CKMB, CKMBINDEX, TROPONINI in the last 168 hours.  HbA1C: Hgb A1c MFr Bld  Date/Time Value Ref Range Status  10/26/2020 06:35 PM 6.9 (H) 4.8 - 5.6 % Final    Comment:    (NOTE) Pre diabetes:          5.7%-6.4%  Diabetes:              >6.4%  Glycemic control for   <7.0% adults with diabetes     CBG: Recent Labs  Lab 11/01/20 1545 11/01/20 2034 11/02/20 0022 11/02/20 0423 11/02/20 0850  GLUCAP 109* 229* 185* 175* 172*   Sherrilyn Rist, MD [place] [place] [facility] Pager: [phone]

## 2020-11-02 NOTE — Progress Notes (Signed)
Lethargy  ABG noted  BIPAP changes made- 16/6 rate 26  ABG in AM

## 2020-11-02 NOTE — Progress Notes (Signed)
PT Cancellation Note  Patient Details Name: [name] [name] MRN: [id number] DOB: 1946/09/19   Cancelled Treatment:    Reason Eval/Treat Not Completed: Medical issues which prohibited therapy.  Pt with sats in the 70's on HHFNC at 25L.  Now on BIPAP and SpO2 at 100%.  Will hold for now and try back later as able. 11/02/2020  [name], PT Acute Rehabilitation Services [phone]  (pager) [phone]  (office)   [name] 11/02/2020, 12:52 PM

## 2020-11-02 NOTE — Progress Notes (Signed)
Informed of [name] [name] decompensation  Improved with BiPAP  Will continue BiPAP at present

## 2020-11-02 NOTE — Progress Notes (Signed)
Satting into 70s on Hi Flow O2. Added 100 NRB. RT to bedside. MD Olalere notified. VO obtained for bipap. Call to pt's sister who confirmed that we are to "do everythin" for pt at this time. She will bring in his Advanced Directives when she visits tomorrow.

## 2020-11-03 DIAGNOSIS — L89000 Pressure ulcer of unspecified elbow, unstageable: Secondary | ICD-10-CM

## 2020-11-03 DIAGNOSIS — J9811 Atelectasis: Secondary | ICD-10-CM

## 2020-11-03 LAB — POCT I-STAT 7, (LYTES, BLD GAS, ICA,H+H)
Acid-Base Excess: 9 mmol/L — ABNORMAL HIGH (ref 0.0–2.0)
Bicarbonate: 34 mmol/L — ABNORMAL HIGH (ref 20.0–28.0)
Calcium, Ion: 1.19 mmol/L (ref 1.15–1.40)
HCT: 29 % — ABNORMAL LOW (ref 39.0–52.0)
Hemoglobin: 9.9 g/dL — ABNORMAL LOW (ref 13.0–17.0)
O2 Saturation: 91 %
Patient temperature: 99.2
Potassium: 3.9 mmol/L (ref 3.5–5.1)
Sodium: 152 mmol/L — ABNORMAL HIGH (ref 135–145)
TCO2: 35 mmol/L — ABNORMAL HIGH (ref 22–32)
pCO2 arterial: 50 mmHg — ABNORMAL HIGH (ref 32.0–48.0)
pH, Arterial: 7.442 (ref 7.350–7.450)
pO2, Arterial: 61 mmHg — ABNORMAL LOW (ref 83.0–108.0)

## 2020-11-03 LAB — GLUCOSE, CAPILLARY
Glucose-Capillary: 120 mg/dL — ABNORMAL HIGH (ref 70–99)
Glucose-Capillary: 139 mg/dL — ABNORMAL HIGH (ref 70–99)
Glucose-Capillary: 161 mg/dL — ABNORMAL HIGH (ref 70–99)
Glucose-Capillary: 166 mg/dL — ABNORMAL HIGH (ref 70–99)
Glucose-Capillary: 170 mg/dL — ABNORMAL HIGH (ref 70–99)
Glucose-Capillary: 194 mg/dL — ABNORMAL HIGH (ref 70–99)

## 2020-11-03 LAB — BASIC METABOLIC PANEL
Anion gap: 11 (ref 5–15)
BUN: 52 mg/dL — ABNORMAL HIGH (ref 8–23)
CO2: 32 mmol/L (ref 22–32)
Calcium: 8.6 mg/dL — ABNORMAL LOW (ref 8.9–10.3)
Chloride: 112 mmol/L — ABNORMAL HIGH (ref 98–111)
Creatinine, Ser: 1.22 mg/dL (ref 0.61–1.24)
GFR, Estimated: >60 mL/min (ref >60)
Glucose, Bld: 143 mg/dL — ABNORMAL HIGH (ref 70–99)
Potassium: 4 mmol/L (ref 3.5–5.1)
Sodium: 155 mmol/L — ABNORMAL HIGH (ref 135–145)

## 2020-11-03 LAB — TROPONIN I (HIGH SENSITIVITY)
Troponin I (High Sensitivity): 50 ng/L — ABNORMAL HIGH (ref <18)
Troponin I (High Sensitivity): 104 ng/L (ref <18)

## 2020-11-03 MED ORDER — METOPROLOL TARTRATE 5 MG/5ML IV SOLN
2.5000 mg | Freq: Four times a day (QID) | INTRAVENOUS | Status: DC
  Administered 2020-11-04 – 2020-11-07 (×13): 2.5 mg via INTRAVENOUS
  Filled 2020-11-03 (×13): qty 5

## 2020-11-03 MED ORDER — FENTANYL CITRATE (PF) 100 MCG/2ML IJ SOLN
INTRAMUSCULAR | Status: AC
  Filled 2020-11-03: qty 2

## 2020-11-03 MED ORDER — FENTANYL CITRATE (PF) 100 MCG/2ML IJ SOLN
25.0000 ug | Freq: Once | INTRAMUSCULAR | Status: AC
  Administered 2020-11-03: 25 ug via INTRAVENOUS

## 2020-11-03 MED ORDER — FREE WATER
200.0000 mL | Freq: Four times a day (QID) | Status: DC
  Administered 2020-11-03 – 2020-11-04 (×4): 200 mL

## 2020-11-03 NOTE — Progress Notes (Signed)
Patient's pulmonary status remains marginal.  Continues on BiPAP.  PCO2 50, PO2 61.  Patient is diuresing fluid.  His wounds are clean and dry.  He is maintaining his airway well.  Motor and sensory examination is stable with continued left-sided dysesthetic pain and some weakness.  Status post anterior and posterior cervical surgery with significant cord edema/contusion.  Continue supportive efforts.  Pulmonary management per CCM.

## 2020-11-03 NOTE — Progress Notes (Addendum)
NAME:  [name] [name], MRN:  [id number], DOB:  1946-04-11, LOS: 8 ADMISSION DATE:  10/26/2020, CONSULTATION DATE:  10/28/20 REFERRING MD:  Sherley Bounds, MD CHIEF COMPLAINT:  Acute hypoxemic respiratory failure  Brief History   74 year old s/p posterior cervical laminectomy  History of present illness   [name] [name] is a 74 year old male with cervical stenosis. Admitted for ACDF on 12/10 and developed LUE weakness post-op requiring posterior cervical decompression on 12/11. PCCM called on 12/12 for post-op hypoxemia. Yesterday was on RA and needed supplemental O2 this morning which has now increased to NRB. Given lasix 20 mg. He is on NIV at home for OSA. Has not worn device since his first surgery.  Past Medical History  Cervical stenosis, chronic diastolic heart failure, OSA, COPD, neuropathy  Significant Hospital Events   12/10 ACDF C3/4 C5/6 12/11 Posterior cervical decompression C3-6 12/12 PCCM consulted for post-op hypoxemia 12/17-respiratory decompensation with hypoxemia, now on BiPAP  Consults:  NSG PCCM  Procedures:    Significant Diagnostic Tests:   MRI cord signal change C3-4 C5-6 CXR 10/28/20 - Left atelectasis CXR 12/13> increasing L opacity -- effusion vs obstructive atelectasis  Chest x-ray 12/16-left lower lobe atelectasis, small effusion  Micro Data:  SARS Covid 12/11 - neg  Antimicrobials:  12/12 Unasyn -12/17  Interim history/subjective:  JP removed 12/14 Unable to be off BiPAP this morning  Objective   Blood pressure 134/67, pulse 78, temperature (!) 100.6 F (38.1 C), temperature source Axillary, resp. rate 15, height 6\' 2"  (1.88 m), weight 117.5 kg, SpO2 99 %.    FiO2 (%):  [50 %-100 %] 100 %   Intake/Output Summary (Last 24 hours) at 11/03/2020 0913 Last data filed at 11/03/2020 0500 Gross per 24 hour  Intake 1347.62 ml  Output 1500 ml  Net -152.38 ml   Filed Weights   11/01/20 0749 11/02/20 0500  Weight: 118 kg 117.5 kg    Physical  Exam: General: Not in distress, BiPAP in place HENT: Dry oral mucosa Respiratory: Decreased air movement at the bases, no rhonchi cardiovascular: S1-S2 appreciated GI: Bowel sounds appreciated Extremities: No edema, no clubbing Neuro: LUE allodynia. Skin: c/d/w. Surgical incisions well approximated Severe excoriation/maceration noted around penis  ABG: 7.44/50/61  Resolved Hospital Problem list     Assessment & Plan:   Multifocal encephalopathy -Delirium is better -Continue pain management  Hypoxic/hypercarbic respiratory failure -Continue BiPAP -Wean as tolerated -Was unable to tolerate being off BiPAP this morning  Presumed COPD -We will continue bronchodilators  Atelectasis, multilobar infiltrate -Currently off antibiotics did have 5 days of Unasyn -We will monitor -Follow-up chest x-ray in a.m.  Diastolic heart failure -On Lasix  Status post posterior laminectomy -Left upper extremity allodynia -On OxyIR -On gabapentin  Presumed aspiration -Has had 5 days of Unasyn, stopped 12/17  Inadequate p.o. intake -Keep cortrak in place -Advance diet as tolerated  Hyponatremia -Add free water -Monitor electrolytes  Place foley catheter 12/18  Diabetes -On SSI  Hypertension Hyperlipidemia -On labetalol, Cozaar, Lipitor  Risk of decompensation remains significant especially with not been able to come off BiPAP Continue close monitoring in the ICU  Best practice (evaluated daily)   Diet: Enteric nutrition Pain/Anxiety/Delirium protocol (if indicated): pRN oxy. TID gaba VAP protocol (if indicated):  DVT prophylaxis: SCDs -- chemical vte ppx per NSGY  GI prophylaxis: PPI Glucose control: CBG q4h Mobility: BR last date of multidisciplinary goals of care discussion N/A Family and staff present N/A Summary of discussion  N/A Follow up goals of care discussion due -- per primary 12/17: Caregiver updated at bedside Code Status: Full code.  Disposition:  ICU  Labs   CBC: Recent Labs  Lab 10/29/20 0645 10/30/20 0646 10/31/20 0510 11/02/20 1726 11/03/20 0221  WBC 17.4* 14.8* 17.8*  --   --   NEUTROABS 14.5*  --   --   --   --   HGB 13.4 12.8* 11.7* 10.2* 9.9*  HCT 38.7* 38.9* 36.1* 30.0* 29.0*  MCV 90.0 92.2 92.8  --   --   PLT 277 198 199  --   --     Basic Metabolic Panel: Recent Labs  Lab 10/27/20 2109 10/27/20 2109 10/29/20 0645 10/29/20 1705 10/30/20 0646 10/30/20 1708 10/31/20 0510 11/01/20 1047 11/02/20 1529 11/02/20 1726 11/03/20 0221  NA 135  --  137  --  141  --  147* 145  --  151* 152*  K 4.4  --  3.7  --  3.7  --  3.0* 3.2* 4.3 4.1 3.9  CL 98  --  99  --  102  --  107 104  --   --   --   CO2  --   --  28  --  26  --  31 26  --   --   --   GLUCOSE 151*  --  129*  --  170*  --  100* 181*  --   --   --   BUN 19  --  20  --  33*  --  27* 39*  --   --   --   CREATININE 0.80  --  0.81  --  0.91  --  0.88 1.07  --   --   --   CALCIUM  --   --  8.8*  --  8.9  --  8.4* 8.2*  --   --   --   MG  --   --  2.0 2.0 2.2 2.2 2.3  --   --   --   --   PHOS  --    < > 2.1* 3.2 1.7* 1.7* 1.5* 2.5 4.0  --   --    < > = values in this interval not displayed.   GFR: Estimated Creatinine Clearance: 80 mL/min (by C-G formula based on SCr of 1.07 mg/dL). Recent Labs  Lab 10/29/20 0645 10/30/20 0646 10/31/20 0510  WBC 17.4* 14.8* 17.8*    Liver Function Tests: No results for input(s): AST, ALT, ALKPHOS, BILITOT, PROT, ALBUMIN in the last 168 hours. No results for input(s): LIPASE, AMYLASE in the last 168 hours. No results for input(s): AMMONIA in the last 168 hours.  ABG    Component Value Date/Time   PHART 7.442 11/03/2020 0221   PCO2ART 50.0 (H) 11/03/2020 0221   PO2ART 61 (L) 11/03/2020 0221   HCO3 34.0 (H) 11/03/2020 0221   TCO2 35 (H) 11/03/2020 0221   O2SAT 91.0 11/03/2020 0221     Coagulation Profile: No results for input(s): INR, PROTIME in the last 168 hours.  Cardiac Enzymes: No results for  input(s): CKTOTAL, CKMB, CKMBINDEX, TROPONINI in the last 168 hours.  HbA1C: Hgb A1c MFr Bld  Date/Time Value Ref Range Status  10/26/2020 06:35 PM 6.9 (H) 4.8 - 5.6 % Final    Comment:    (NOTE) Pre diabetes:          5.7%-6.4%  Diabetes:              >  6.4%  Glycemic control for   <7.0% adults with diabetes     CBG: Recent Labs  Lab 11/02/20 1612 11/02/20 2048 11/03/20 0040 11/03/20 0501 11/03/20 0830  GLUCAP 168* 175* 194* 161* 120*   The patient is critically ill with multiple organ systems failure and requires high complexity decision making for assessment and support, frequent evaluation and titration of therapies, application of advanced monitoring technologies and extensive interpretation of multiple databases. Critical Care Time devoted to patient care services described in this note independent of APP/resident time (if applicable)  is 32 minutes.   Sherrilyn Rist MD [facility] Personal pager: [phone] If unanswered, please page CCM On-call: [phone]

## 2020-11-03 NOTE — Progress Notes (Signed)
Pt taken off Bipap and placed back on HHFNC 25L and 100% Sp02 98% RR 20.  Pt tolerating well at this time

## 2020-11-03 NOTE — Progress Notes (Signed)
[facility] Progress Note Patient Name: [name] [name] DOB: 1946/11/03 MRN: [id number]   Date of Service  11/03/2020  HPI/Events of Note  Elevated Troponin = Troponin = 50 --> 104. According to nurse Troponin was drawn for ST segment elevation seen on bedside monitor. Allergy to Ibuprofen, therefore, can't use ASA.  eICU Interventions  Plan: 1. Metoprolol 2.5 mg IV now and Q 6 hours. Hold dose for HR < 60 or SBP < 105.  2. 12 Lead EKG STAT.     Intervention Category Major Interventions: Other:  Lysle Dingwall 11/03/2020, 10:09 PM

## 2020-11-03 NOTE — Progress Notes (Signed)
Notified MD Ol[place] of monitor alarming ST elevation. See new orders for troponin and EKG.

## 2020-11-03 NOTE — Progress Notes (Signed)
CRITICAL VALUE ALERT  Critical Value: Troponin 104  Date & Time Notied: 2123 11/03/2020  Provider Notified: Warren Lacy CCM  Orders Received/Actions taken: Awaiting new orders

## 2020-11-03 NOTE — Progress Notes (Signed)
PT Cancellation Note  Patient Details Name: [name] [name] MRN: [id number] DOB: 04-Jan-1946   Cancelled Treatment:    Reason Eval/Treat Not Completed: Medical issues which prohibited therapy. RN asked PT to continue to hold due to ongoing issues with pt respiratory status. PT will continue to follow and treat as appropriate.   Hardie Pulley, DPT   Acute Rehabilitation Department Pager #: [phone]   Otho Bellows 11/03/2020, 10:56 AM

## 2020-11-03 NOTE — Progress Notes (Signed)
Pt Sp02 dropped while on HHFNC.  Pt placed back on Bipap on documented settings. Pt tolerating well

## 2020-11-03 NOTE — Progress Notes (Signed)
[facility] Progress Note Patient Name: [name] DOB: 05-24-1946 MRN: [id number]   Date of Service  11/03/2020  HPI/Events of Note  12 Lead EKG reveals: Normal sinus rhythm. Right bundle branch block. Left anterior fascicular block.  Findings c/w Bifascicular block. Minimal voltage criteria for LVH, may be normal variant ( R in aVL ).  No EKG evidence of acute STEMI.   eICU Interventions  Continue present management.      Intervention Category Major Interventions: Other:  Lysle Dingwall 11/03/2020, 10:44 PM

## 2020-11-04 ENCOUNTER — Inpatient Hospital Stay (HOSPITAL_COMMUNITY): Payer: Medicare Other

## 2020-11-04 DIAGNOSIS — J189 Pneumonia, unspecified organism: Secondary | ICD-10-CM

## 2020-11-04 LAB — GLUCOSE, CAPILLARY
Glucose-Capillary: 140 mg/dL — ABNORMAL HIGH (ref 70–99)
Glucose-Capillary: 160 mg/dL — ABNORMAL HIGH (ref 70–99)
Glucose-Capillary: 161 mg/dL — ABNORMAL HIGH (ref 70–99)
Glucose-Capillary: 170 mg/dL — ABNORMAL HIGH (ref 70–99)
Glucose-Capillary: 180 mg/dL — ABNORMAL HIGH (ref 70–99)
Glucose-Capillary: 187 mg/dL — ABNORMAL HIGH (ref 70–99)
Glucose-Capillary: 210 mg/dL — ABNORMAL HIGH (ref 70–99)

## 2020-11-04 LAB — CBC WITH DIFFERENTIAL/PLATELET
Abs Immature Granulocytes: 0.2 10*3/uL — ABNORMAL HIGH (ref 0.00–0.07)
Basophils Absolute: 0 10*3/uL (ref 0.0–0.1)
Basophils Relative: 0 %
Eosinophils Absolute: 0 10*3/uL (ref 0.0–0.5)
Eosinophils Relative: 0 %
HCT: 30 % — ABNORMAL LOW (ref 39.0–52.0)
Hemoglobin: 9.2 g/dL — ABNORMAL LOW (ref 13.0–17.0)
Immature Granulocytes: 1 %
Lymphocytes Relative: 4 %
Lymphs Abs: 0.9 10*3/uL (ref 0.7–4.0)
MCH: 30.2 pg (ref 26.0–34.0)
MCHC: 30.7 g/dL (ref 30.0–36.0)
MCV: 98.4 fL (ref 80.0–100.0)
Monocytes Absolute: 2.1 10*3/uL — ABNORMAL HIGH (ref 0.1–1.0)
Monocytes Relative: 10 %
Neutro Abs: 17.5 10*3/uL — ABNORMAL HIGH (ref 1.7–7.7)
Neutrophils Relative %: 85 %
Platelets: 192 10*3/uL (ref 150–400)
RBC: 3.05 MIL/uL — ABNORMAL LOW (ref 4.22–5.81)
RDW: 14.8 % (ref 11.5–15.5)
WBC: 20.7 10*3/uL — ABNORMAL HIGH (ref 4.0–10.5)
nRBC: 0 % (ref 0.0–0.2)

## 2020-11-04 LAB — BASIC METABOLIC PANEL
Anion gap: 13 (ref 5–15)
BUN: 52 mg/dL — ABNORMAL HIGH (ref 8–23)
CO2: 30 mmol/L (ref 22–32)
Calcium: 8.5 mg/dL — ABNORMAL LOW (ref 8.9–10.3)
Chloride: 112 mmol/L — ABNORMAL HIGH (ref 98–111)
Creatinine, Ser: 1.31 mg/dL — ABNORMAL HIGH (ref 0.61–1.24)
GFR, Estimated: 56 mL/min — ABNORMAL LOW (ref >60)
Glucose, Bld: 202 mg/dL — ABNORMAL HIGH (ref 70–99)
Potassium: 3.5 mmol/L (ref 3.5–5.1)
Sodium: 155 mmol/L — ABNORMAL HIGH (ref 135–145)

## 2020-11-04 MED ORDER — INSULIN ASPART 100 UNIT/ML ~~LOC~~ SOLN
2.0000 [IU] | SUBCUTANEOUS | Status: DC
  Administered 2020-11-04 – 2020-11-10 (×34): 2 [IU] via SUBCUTANEOUS

## 2020-11-04 MED ORDER — FREE WATER
300.0000 mL | Freq: Four times a day (QID) | Status: DC
  Administered 2020-11-04 – 2020-11-06 (×8): 300 mL

## 2020-11-04 MED ORDER — SODIUM CHLORIDE 0.9 % IV SOLN
2.0000 g | INTRAVENOUS | Status: DC
  Administered 2020-11-04 – 2020-11-06 (×3): 2 g via INTRAVENOUS
  Filled 2020-11-04: qty 2
  Filled 2020-11-04: qty 20
  Filled 2020-11-04 (×2): qty 2

## 2020-11-04 MED ORDER — DEXTROSE 5 % IV BOLUS
500.0000 mL | Freq: Once | INTRAVENOUS | Status: AC
  Administered 2020-11-04: 11:00:00 500 mL via INTRAVENOUS

## 2020-11-04 NOTE — Progress Notes (Signed)
Neurosurgery Service Progress Note  Subjective: No acute events overnight   Objective: Vitals:   11/04/20 0900 11/04/20 1000 11/04/20 1100 11/04/20 1105  BP: (!) 125/57 124/63 (!) 137/56   Pulse: 77 79 78   Resp: 20 (!) 24 (!) 23   Temp:    (!) 101.5 F (38.6 C)  TempSrc:    Axillary  SpO2: 98% 98% 98%   Weight:      Height:        Physical Exam: On BiPAP, FCx4 with LUE weakness and dysesthesias, LLE slower than R, R side 5/5, incisions c/d/i  Assessment & Plan: 74 y.o. man s/p ACDF/laminectomies, c/c/b respiratory failure.  -CCM recs -no change in neurosurgical plan of care  Judith Part  11/04/20 11:33 AM

## 2020-11-04 NOTE — Progress Notes (Signed)
NAME:  [name] [name], MRN:  [id number], DOB:  1945-12-01, LOS: 9 ADMISSION DATE:  10/26/2020, CONSULTATION DATE:  10/28/20 REFERRING MD:  Sherley Bounds, MD CHIEF COMPLAINT:  Acute hypoxemic respiratory failure  Brief History   74 year old s/p posterior cervical laminectomy  History of present illness   [name] [name] is a 74 year old male with cervical stenosis. Admitted for ACDF on 12/10 and developed LUE weakness post-op requiring posterior cervical decompression on 12/11. PCCM called on 12/12 for post-op hypoxemia. Yesterday was on RA and needed supplemental O2 this morning which has now increased to NRB. Given lasix 20 mg. He is on NIV at home for OSA. Has not worn device since his first surgery.  Past Medical History  Cervical stenosis, chronic diastolic heart failure, OSA, COPD, neuropathy  Significant Hospital Events   12/10 ACDF C3/4 C5/6 12/11 Posterior cervical decompression C3-6 12/12 PCCM consulted for post-op hypoxemia 12/17-respiratory decompensation with hypoxemia, now on BiPAP  Consults:  NSG PCCM  Procedures:    Significant Diagnostic Tests:   MRI cord signal change C3-4 C5-6 CXR 10/28/20 - Left atelectasis CXR 12/13> increasing L opacity -- effusion vs obstructive atelectasis  Chest x-ray 12/16-left lower lobe atelectasis, small effusion  Micro Data:  SARS Covid 12/11 - neg  Antimicrobials:  12/12 Unasyn -12/17  Interim history/subjective:  JP removed 12/14 No overnight events Remains on BiPAP EKG changes noted, marginal elevation of troponin-hemodynamics have remained stable Diuresing well Objective   Blood pressure 120/61, pulse 73, temperature (!) 100.4 F (38 C), temperature source Axillary, resp. rate 20, height 6\' 2"  (1.88 m), weight 117.5 kg, SpO2 97 %.    FiO2 (%):  [40 %] 40 %   Intake/Output Summary (Last 24 hours) at 11/04/2020 0854 Last data filed at 11/04/2020 0800 Gross per 24 hour  Intake 2197.76 ml  Output 3780 ml  Net -1582.24  ml   Filed Weights   11/01/20 0749 11/02/20 0500  Weight: 118 kg 117.5 kg    Physical Exam: General: Not in distress, BiPAP in place HENT: Dry oral mucosa Respiratory: Air entry is decreased bilaterally cardiovascular: S1-S2 appreciated GI: Bowel sounds appreciated Extremities: No extremity edema Neuro: Left upper extremity allodynia is better Skin: c/d/w. Surgical incisions well approximated Severe excoriation/maceration noted around penis  ABG: 7.44/50/61 -1.7 L in 24 hours  Resolved Hospital Problem list     Assessment & Plan:   Multifactorial encephalopathy -Continue pain management -Delirium is better  Hypoxic/hypercarbic respiratory failure -Continue BiPAP -We will wean as tolerated -High flow nasal cannula if tolerated  Presumed COPD -Continue bronchodilators  Atelectasis, multilobar infiltrate -Use of antibiotics Chest x-ray actually looks better today-reviewed by myself  Diastolic heart failure -On Lasix  Status post anterior and posterior neck surgery -On gabapentin, OxyIR   P.o. intake inadequate -Continue tube feeds  Foley catheter was placed 1218 for maceration around penis -Had over 1.5 L in his bladder at the time  Free water for hypernatremia  Diabetes -On SSI  Hypertension Hyperlipidemia -On labetalol, Cozaar, Lipitor  T-max of 102.2 with a leukocytosis Thick secretions being suctioned -I will initiate Rocephin-tentative plan for 5 days -He did have 5 days of Unasyn  Risk of decompensation remains significant especially with not been able to come off BiPAP Continue close monitoring in the ICU  Best practice (evaluated daily)   Diet: Enteric nutrition Pain/Anxiety/Delirium protocol (if indicated): pRN oxy. TID [name] VAP protocol (if indicated):  DVT prophylaxis: SCDs -- chemical vte ppx per NSGY  GI prophylaxis: PPI Glucose control: CBG q4h Mobility: BR last date of multidisciplinary goals of care discussion N/A Family  and staff present N/A Summary of discussion N/A Follow up goals of care discussion due -- per primary 12/17: Caregiver updated at bedside Code Status: Full code.  Disposition: ICU  Labs   CBC: Recent Labs  Lab 10/29/20 0645 10/30/20 0646 10/31/20 0510 11/02/20 1726 11/03/20 0221 11/04/20 0232  WBC 17.4* 14.8* 17.8*  --   --  20.7*  NEUTROABS 14.5*  --   --   --   --  17.5*  HGB 13.4 12.8* 11.7* 10.2* 9.9* 9.2*  HCT 38.7* 38.9* 36.1* 30.0* 29.0* 30.0*  MCV 90.0 92.2 92.8  --   --  98.4  PLT 277 198 199  --   --  177    Basic Metabolic Panel: Recent Labs  Lab 10/29/20 0645 10/29/20 1705 10/30/20 0646 10/30/20 1708 10/31/20 0510 11/01/20 1047 11/02/20 1529 11/02/20 1726 11/03/20 0221 11/03/20 1055 11/04/20 0232  NA 137  --  141  --  147* 145  --  151* 152* 155* 155*  K 3.7  --  3.7  --  3.0* 3.2* 4.3 4.1 3.9 4.0 3.5  CL 99  --  102  --  107 104  --   --   --  112* 112*  CO2 28  --  26  --  31 26  --   --   --  32 30  GLUCOSE 129*  --  170*  --  100* 181*  --   --   --  143* 202*  BUN 20  --  33*  --  27* 39*  --   --   --  52* 52*  CREATININE 0.81  --  0.91  --  0.88 1.07  --   --   --  1.22 1.31*  CALCIUM 8.8*  --  8.9  --  8.4* 8.2*  --   --   --  8.6* 8.5*  MG 2.0 2.0 2.2 2.2 2.3  --   --   --   --   --   --   PHOS 2.1* 3.2 1.7* 1.7* 1.5* 2.5 4.0  --   --   --   --    GFR: Estimated Creatinine Clearance: 65.3 mL/min (A) (by C-G formula based on SCr of 1.31 mg/dL (H)). Recent Labs  Lab 10/29/20 0645 10/30/20 0646 10/31/20 0510 11/04/20 0232  WBC 17.4* 14.8* 17.8* 20.7*    Liver Function Tests: No results for input(s): AST, ALT, ALKPHOS, BILITOT, PROT, ALBUMIN in the last 168 hours. No results for input(s): LIPASE, AMYLASE in the last 168 hours. No results for input(s): AMMONIA in the last 168 hours.  ABG    Component Value Date/Time   PHART 7.442 11/03/2020 0221   PCO2ART 50.0 (H) 11/03/2020 0221   PO2ART 61 (L) 11/03/2020 0221   HCO3 34.0 (H)  11/03/2020 0221   TCO2 35 (H) 11/03/2020 0221   O2SAT 91.0 11/03/2020 0221     Coagulation Profile: No results for input(s): INR, PROTIME in the last 168 hours.  Cardiac Enzymes: No results for input(s): CKTOTAL, CKMB, CKMBINDEX, TROPONINI in the last 168 hours.  HbA1C: Hgb A1c MFr Bld  Date/Time Value Ref Range Status  10/26/2020 06:35 PM 6.9 (H) 4.8 - 5.6 % Final    Comment:    (NOTE) Pre diabetes:          5.7%-6.4%  Diabetes:              >  6.4%  Glycemic control for   <7.0% adults with diabetes     CBG: Recent Labs  Lab 11/03/20 1601 11/03/20 1936 11/04/20 0001 11/04/20 0424 11/04/20 0710  GLUCAP 166* 139* 210* 170* 140*   The patient is critically ill with multiple organ systems failure and requires high complexity decision making for assessment and support, frequent evaluation and titration of therapies, application of advanced monitoring technologies and extensive interpretation of multiple databases. Critical Care Time devoted to patient care services described in this note independent of APP/resident time (if applicable)  is 35 minutes.   Sherrilyn Rist MD [facility] Personal pager: [phone] If unanswered, please page CCM On-call: [phone]

## 2020-11-05 DIAGNOSIS — E662 Morbid (severe) obesity with alveolar hypoventilation: Secondary | ICD-10-CM

## 2020-11-05 DIAGNOSIS — J9622 Acute and chronic respiratory failure with hypercapnia: Secondary | ICD-10-CM

## 2020-11-05 DIAGNOSIS — G4733 Obstructive sleep apnea (adult) (pediatric): Secondary | ICD-10-CM

## 2020-11-05 DIAGNOSIS — J9621 Acute and chronic respiratory failure with hypoxia: Secondary | ICD-10-CM

## 2020-11-05 DIAGNOSIS — J449 Chronic obstructive pulmonary disease, unspecified: Secondary | ICD-10-CM

## 2020-11-05 LAB — BASIC METABOLIC PANEL
Anion gap: 12 (ref 5–15)
BUN: 48 mg/dL — ABNORMAL HIGH (ref 8–23)
CO2: 31 mmol/L (ref 22–32)
Calcium: 8.2 mg/dL — ABNORMAL LOW (ref 8.9–10.3)
Chloride: 113 mmol/L — ABNORMAL HIGH (ref 98–111)
Creatinine, Ser: 1.23 mg/dL (ref 0.61–1.24)
GFR, Estimated: >60 mL/min (ref >60)
Glucose, Bld: 146 mg/dL — ABNORMAL HIGH (ref 70–99)
Potassium: 3.4 mmol/L — ABNORMAL LOW (ref 3.5–5.1)
Sodium: 156 mmol/L — ABNORMAL HIGH (ref 135–145)

## 2020-11-05 LAB — GLUCOSE, CAPILLARY
Glucose-Capillary: 124 mg/dL — ABNORMAL HIGH (ref 70–99)
Glucose-Capillary: 157 mg/dL — ABNORMAL HIGH (ref 70–99)
Glucose-Capillary: 163 mg/dL — ABNORMAL HIGH (ref 70–99)
Glucose-Capillary: 163 mg/dL — ABNORMAL HIGH (ref 70–99)
Glucose-Capillary: 167 mg/dL — ABNORMAL HIGH (ref 70–99)
Glucose-Capillary: 171 mg/dL — ABNORMAL HIGH (ref 70–99)

## 2020-11-05 MED ORDER — REVEFENACIN 175 MCG/3ML IN SOLN
175.0000 ug | Freq: Every day | RESPIRATORY_TRACT | Status: DC
  Administered 2020-11-06 – 2020-11-09 (×4): 175 ug via RESPIRATORY_TRACT
  Filled 2020-11-05 (×5): qty 3

## 2020-11-05 MED ORDER — ARFORMOTEROL TARTRATE 15 MCG/2ML IN NEBU
15.0000 ug | INHALATION_SOLUTION | Freq: Two times a day (BID) | RESPIRATORY_TRACT | Status: DC
  Administered 2020-11-05 – 2020-11-29 (×47): 15 ug via RESPIRATORY_TRACT
  Filled 2020-11-05 (×50): qty 2

## 2020-11-05 MED ORDER — ALBUTEROL SULFATE (2.5 MG/3ML) 0.083% IN NEBU
2.5000 mg | INHALATION_SOLUTION | RESPIRATORY_TRACT | Status: DC | PRN
  Administered 2020-11-08: 08:00:00 2.5 mg via RESPIRATORY_TRACT
  Filled 2020-11-05: qty 3

## 2020-11-05 MED ORDER — DIAZEPAM 2 MG PO TABS
2.0000 mg | ORAL_TABLET | Freq: Four times a day (QID) | ORAL | Status: DC | PRN
  Administered 2020-11-07 – 2020-11-08 (×2): 2 mg
  Filled 2020-11-05 (×2): qty 1

## 2020-11-05 MED ORDER — DEXTROSE 5 % IV SOLN: INTRAVENOUS | Status: DC

## 2020-11-05 MED ORDER — BUDESONIDE 0.25 MG/2ML IN SUSP
0.2500 mg | Freq: Two times a day (BID) | RESPIRATORY_TRACT | Status: DC
  Administered 2020-11-05 – 2020-11-12 (×14): 0.25 mg via RESPIRATORY_TRACT
  Filled 2020-11-05 (×14): qty 2

## 2020-11-05 MED ORDER — POTASSIUM CHLORIDE 20 MEQ PO PACK
40.0000 meq | PACK | Freq: Once | ORAL | Status: AC
  Administered 2020-11-05: 10:00:00 40 meq
  Filled 2020-11-05: qty 2

## 2020-11-05 NOTE — Progress Notes (Signed)
PCCM Family Communication Note  Met with pts sister [name] [name] at bedside. [name] [name] has brought the patient's advance directives from 2015 which specify [name] [name] (pts nephew in law) as HCPOA.  Directives indicate that in cases of incurable/irreversible conditions that will result in death in short time, irreversible unconsciousness and irreversible dementia/loss of cognitive ability pt would not want prolonged life sustaining measures if HCPOA feels burdens outweigh benefits.  There is not clear documentation for wishes in a more acute sense, seems like would want full scope of tx for period of time (Sister (not hcpoa) at bedside says "he would a breathing machine for a little if he needed to but would not want to be kept alive on machines"   I do see in prior hospitalization 11/28/2018 [name] [name] conversation yielded DNR status, with pt stating would not want CPR, intubation or vent support. Possible that this has changed in past year.   I have attempted to call HCPOA to clarify/discuss, have not been able to reach   In interim, default to Full Code based on directive documents and lack of confirmation of current DNR/I status.     Eliseo Gum MSN, AGACNP-BC [place] [phone] If no answer, [phone] 11/05/2020, 2:36 PM

## 2020-11-05 NOTE — Progress Notes (Signed)
Physical Therapy Treatment Patient Details Name: [name] [name] MRN: [id number] DOB: June 12, 1946 Today's Date: 11/05/2020    History of Present Illness Patient is a 74 y/o male who presents s/p C3-4, 4-5 ACDF 12/10 with post op LUE weakness requiring C3-6 posterior decompressive lami 66/44 complicated by post op hypoxemia. PMH includes COPD, prostata CA, HTN, depression, CHF.    PT Comments    Pt initially lethargic, but aroused once sitting up.  Pt noticeably quick to fatigue EOB, with tendency to list forward > posteriorly unless supported.  Sit to stand x2 at EOB.  Ended up in bed post session  Follow Up Recommendations  CIR;Supervision for mobility/OOB;Supervision/Assistance - 24 hour     Equipment Recommendations  None recommended by PT    Recommendations for Other Services       Precautions / Restrictions Precautions Precautions: Fall Restrictions Weight Bearing Restrictions: No    Mobility  Bed Mobility Overal bed mobility: Needs Assistance Bed Mobility: Supine to Sit;Sit to Supine     Supine to sit: +2 for physical assistance;Total assist Sit to supine: Total assist;+2 for physical assistance      Transfers Overall transfer level: Needs assistance Equipment used: 2 person hand held assist Transfers: Sit to/from Stand Sit to Stand: Mod assist;Max assist         General transfer comment: sit<>stand x2 with Mod A +2; third time with Max A +2 from EOB; head down and could only get about 75% of total upright  Ambulation/Gait             General Gait Details: Unable   Stairs             Wheelchair Mobility    Modified Rankin (Stroke Patients Only)       Balance Overall balance assessment: Needs assistance Sitting-balance support: Bilateral upper extremity supported;Feet supported Sitting balance-Leahy Scale: Zero (to poor) Sitting balance - Comments: tendency for anterior lean.  sat EOB for >10 min   Standing balance support: Bilateral  upper extremity supported Standing balance-Leahy Scale: Zero Standing balance comment: reliant on external support.                            Cognition Arousal/Alertness: Lethargic;Awake/alert Behavior During Therapy: Flat affect Overall Cognitive Status: Impaired/Different from baseline Area of Impairment: Attention;Following commands;Safety/judgement;Problem solving                   Current Attention Level: Focused   Following Commands: Follows one step commands inconsistently Safety/Judgement: Decreased awareness of safety;Decreased awareness of deficits   Problem Solving: Slow processing;Decreased initiation;Requires verbal cues;Requires tactile cues;Difficulty sequencing General Comments: pt had Bipap on so difficult to understand; verbal cues and hands on to keep pt from leaning too far forward while at EOB      Exercises Other Exercises Other Exercises: warm up AAROM to Bil LE and UE's x5 reps    General Comments General comments (skin integrity, edema, etc.): SpO2 100% on BiPaP      Pertinent Vitals/Pain Pain Assessment: Faces Faces Pain Scale: Hurts a little bit Pain Location: generalized with mobility Pain Descriptors / Indicators: Guarding;Moaning Pain Intervention(s): Monitored during session    Home Living                      Prior Function            PT Goals (current goals can now be found in the care plan  section) Acute Rehab PT Goals Patient Stated Goal: to get some ice, to go home PT Goal Formulation: With patient Time For Goal Achievement: 11/12/20 Potential to Achieve Goals: Fair Progress towards PT goals: Progressing toward goals    Frequency    Min 5X/week      PT Plan Current plan remains appropriate    Co-evaluation PT/OT/SLP Co-Evaluation/Treatment: Yes Reason for Co-Treatment: For patient/therapist safety;Complexity of the patient's impairments (multi-system involvement) PT goals addressed during  session: Mobility/safety with mobility OT goals addressed during session: Strengthening/ROM      AM-PAC PT "6 Clicks" Mobility   Outcome Measure  Help needed turning from your back to your side while in a flat bed without using bedrails?: A Lot Help needed moving from lying on your back to sitting on the side of a flat bed without using bedrails?: A Lot Help needed moving to and from a bed to a chair (including a wheelchair)?: A Lot Help needed standing up from a chair using your arms (e.g., wheelchair or bedside chair)?: A Lot Help needed to walk in hospital room?: Total Help needed climbing 3-5 steps with a railing? : Total 6 Click Score: 10    End of Session Equipment Utilized During Treatment: Oxygen Activity Tolerance: Patient limited by fatigue Patient left: in bed;with call bell/phone within reach;with bed alarm set Nurse Communication: Mobility status;Need for lift equipment PT Visit Diagnosis: Other abnormalities of gait and mobility (R26.89);Pain Pain - Right/Left: Left Pain - part of body: Arm     Time: 3149-7026 PT Time Calculation (min) (ACUTE ONLY): 31 min  Charges:  $Therapeutic Activity: 8-22 mins                     11/05/2020  [name], PT Acute Rehabilitation Services [phone]  (pager) [phone]  (office)   Tessie Fass Advaith Lamarque 11/05/2020, 3:11 PM

## 2020-11-05 NOTE — Progress Notes (Signed)
Inpatient Rehabilitation Admissions Coordinator  Noted continues on BiPAP. I will follow his progress at a distance.  Danne Baxter, RN, MSN Rehab Admissions Coordinator [phone] 11/05/2020 11:26 AM

## 2020-11-05 NOTE — Progress Notes (Signed)
Occupational Therapy Treatment Patient Details Name: [name] [name] MRN: [id number] DOB: Oct 22, 1946 Today's Date: 11/05/2020    History of present illness Patient is a 74 y/o male who presents s/p C3-4, 4-5 ACDF 12/10 with post op LUE weakness requiring C3-6 posterior decompressive lami 52/84 complicated by post op hypoxemia. PMH includes COPD, prostata CA, HTN, depression, CHF.   OT comments  This 74 yo male admitted and underwent above with post op respiratory issues presents to acute OT today willing to participate and did well despite BIPAP being on; however he continues to be overall very weak. Worked on precursors to basic ADLs (bed mobility, sitting balance and sit<>stand). He was able to stand x3 with [place] today with mod A +2/Max A +2. He will continue to benefit from acute OT with follow up on CIR.   Follow Up Recommendations  CIR;Supervision/Assistance - 24 hour    Equipment Recommendations  3 in 1 bedside commode       Precautions / Restrictions Precautions Precautions: Fall Restrictions Weight Bearing Restrictions: No       Mobility Bed Mobility Overal bed mobility: Needs Assistance Bed Mobility: Supine to Sit;Sit to Supine     Supine to sit: +2 for physical assistance;Total assist Sit to supine: Total assist;+2 for physical assistance      Transfers Overall transfer level: Needs assistance Equipment used: 2 person hand held assist Transfers: Sit to/from Stand Sit to Stand: Mod assist;Max assist         General transfer comment: sit<>stand x2 with Mod A +2; third time with Max A +2 from EOB; head down and could only get about 75% of total upright    Balance Overall balance assessment: Needs assistance Sitting-balance support: Bilateral upper extremity supported;Feet supported Sitting balance-Leahy Scale: Zero Sitting balance - Comments: tendency for anterior lean   Standing balance support: Bilateral upper extremity supported Standing balance-Leahy Scale:  Zero                             ADL either performed or assessed with clinical judgement        Vision   Additional Comments: tendency of keeping head down in sitting and eyes closed intermittently          Cognition Arousal/Alertness: Lethargic;Awake/alert Behavior During Therapy: Flat affect Overall Cognitive Status: Impaired/Different from baseline Area of Impairment: Attention;Following commands;Safety/judgement;Problem solving                   Current Attention Level: Focused   Following Commands: Follows one step commands inconsistently Safety/Judgement: Decreased awareness of safety;Decreased awareness of deficits   Problem Solving: Slow processing;Decreased initiation;Requires verbal cues;Requires tactile cues;Difficulty sequencing General Comments: pt had Bipap on so difficult to understand; verbal cues and hands on to keep pt from leaning too far forward while at EOB        Exercises Other Exercises Other Exercises: warm up AAROM to Bil LE and UE's x5 reps           Pertinent Vitals/ Pain       Pain Assessment: Faces Faces Pain Scale: Hurts a little bit Pain Location: generalized with mobility Pain Descriptors / Indicators: Guarding;Moaning Pain Intervention(s): Monitored during session;Repositioned         Frequency  Min 2X/week        Progress Toward Goals  OT Goals(current goals can now be found in the care plan section)  Progress towards OT goals: Not progressing toward  goals - comment (has had to go on Bipap since initial eval)  Acute Rehab OT Goals Patient Stated Goal: to get some ice, to go home OT Goal Formulation: With patient Time For Goal Achievement: 11/12/20 Potential to Achieve Goals: Good  Plan Discharge plan remains appropriate    Co-evaluation    PT/OT/SLP Co-Evaluation/Treatment: Yes Reason for Co-Treatment: For patient/therapist safety PT goals addressed during session: Mobility/safety with  mobility;Balance;Strengthening/ROM OT goals addressed during session: Strengthening/ROM      AM-PAC OT "6 Clicks" Daily Activity     Outcome Measure   Help from another person eating meals?: Total Help from another person taking care of personal grooming?: Total Help from another person toileting, which includes using toliet, bedpan, or urinal?: Total Help from another person bathing (including washing, rinsing, drying)?: Total Help from another person to put on and taking off regular upper body clothing?: Total Help from another person to put on and taking off regular lower body clothing?: Total 6 Click Score: 6    End of Session Equipment Utilized During Treatment: Oxygen (bipap)  OT Visit Diagnosis: Unsteadiness on feet (R26.81);Other abnormalities of gait and mobility (R26.89);Muscle weakness (generalized) (M62.81);Pain Pain - part of body:  (generalized)   Activity Tolerance Patient tolerated treatment well   Patient Left in bed;with call bell/phone within reach;with family/visitor present;with bed alarm set   Nurse Communication  (pt did well despite being on BIPAP)        Time: 7035-0093 OT Time Calculation (min): 31 min  Charges: OT General Charges $OT Visit: 1 Visit OT Treatments $Self Care/Home Management : 8-22 mins  [place] Circle, OTR/L Acute [employer] Pager [phone] Office [phone]      Almon Register 11/05/2020, 2:06 PM

## 2020-11-05 NOTE — Progress Notes (Signed)
SLP Cancellation Note  Patient Details Name: AROLDO GALLI MRN: [id number] DOB: July 10, 1946   Cancelled treatment:       Reason Eval/Treat Not Completed: Patient not medically ready. On BiPAP    Sinai Mahany, Katherene Ponto 11/05/2020, 10:56 AM

## 2020-11-05 NOTE — Progress Notes (Signed)
Patient ID: [name] [name], male   DOB: 02-27-1946, 74 y.o.   MRN: [id number] BP 135/65    Pulse 65    Temp 99.4 F (37.4 C) (Axillary)    Resp 19    Ht 6\' 2"  (1.88 m)    Wt 117.5 kg    SpO2 98%    BMI 33.26 kg/m  Lethargic, follows commands Will move all extremities Wounds are healing well Respiratory status is tenuous No changes

## 2020-11-05 NOTE — Progress Notes (Signed)
NAME:  [name] [name], MRN:  [id number], DOB:  [birth year], LOS: 77 ADMISSION DATE:  10/26/2020, CONSULTATION DATE:  10/28/20 REFERRING MD:  Sherley Bounds, MD CHIEF COMPLAINT:  Acute hypoxemic respiratory failure  Brief History   74 year old s/p posterior cervical laminectomy  History of present illness   [name] [name] is a 74 year old male with cervical stenosis. Admitted for ACDF on 12/10 and developed LUE weakness post-op requiring posterior cervical decompression on 12/11. PCCM called on 12/12 for post-op hypoxemia. Yesterday was on RA and needed supplemental O2 this morning which has now increased to NRB. Given lasix 20 mg. He is on NIV at home for OSA. Has not worn device since his first surgery.  Past Medical History  Cervical stenosis, chronic diastolic heart failure, OSA, COPD, neuropathy  Significant Hospital Events   12/10 ACDF C3/4 C5/6 12/11 Posterior cervical decompression C3-6 12/12 PCCM consulted for post-op hypoxemia 12/17-respiratory decompensation with hypoxemia, now on BiPAP  Consults:  NSG PCCM  Procedures:    Significant Diagnostic Tests:   MRI cord signal change C3-4 C5-6 CXR 10/28/20 - Left atelectasis CXR 12/13> increasing L opacity -- effusion vs obstructive atelectasis  Chest x-ray 12/16-left lower lobe atelectasis, small effusion  Micro Data:  SARS Covid 12/11 - neg  Antimicrobials:  12/12 Unasyn -12/17 12/19 Rocephin >   Interim history/subjective:  On BiPAP this morning Moving LUE better than when I last saw on 12/17  Na is 156  K 3.4  No CBC this morning  Objective   Blood pressure (!) 120/57, pulse 64, temperature 100.2 F (37.9 C), temperature source Axillary, resp. rate 17, height 6\' 2"  (1.88 m), weight 117.5 kg, SpO2 97 %.    FiO2 (%):  [40 %] 40 %   Intake/Output Summary (Last 24 hours) at 11/05/2020 0736 Last data filed at 11/05/2020 0735 Gross per 24 hour  Intake 2718.43 ml  Output 1820 ml  Net 898.43 ml   Filed Weights    11/01/20 0749 11/02/20 0500  Weight: 118 kg 117.5 kg    Physical Exam: General: Not in distress, BiPAP in place HENT: Dry oral mucosa Respiratory: Air entry is decreased bilaterally cardiovascular: S1-S2 appreciated GI: Bowel sounds appreciated Extremities: No extremity edema Neuro: Left upper extremity allodynia is better Skin: c/d/w. Surgical incisions well approximated Severe excoriation/maceration noted around penis    Resolved Hospital Problem list     Assessment & Plan:   Multifactorial encephalopathy -Continue pain management -Delirium is better  Acute Hypoxic/hypercarbic respiratory failure Atelectasis, multilobar infiltrate-  -initial suspicion for aspiration, tx with unasyn 12/12-12/17 -worsening resp status, infiltrate on CXR, persistent WBC - Tmax 102.2, thick secretions -- started rocephin 12/19 Presumed Hx COPD  -rocephin (start date 12/19) -Continues on BiPAP -- goal to wean to [facility] if tolerated  -SpO2 goal > 88%  -Trend CXR -BDs   Diastolic heart failure -is now off of scheduled lasix  -trend fluid status (has been started on FWF for sodium) -- continue to evaluate for PRN diuresis   S/p ACDF/ laminectomy  LUE allodynia  -On gabapentin, OxyIR  Inadequate PO intake -Continue tube feeds via cortrak   Maceration around penis -continue foley  Hypernatremia Hypokalemia, mild - FWF 370ml q6hr started 12/19 afternoon - add low dose D5 (109ml/hr x 10hr) on 12/20 -if not improving, could also consider lasix + thiazide for natiuresis in net positive pt  -replace K -cont to trend BMP   Diabetes -SSI   Hypertension Hyperlipidemia -Lipitor, Cozaar, metop -  PRN labetalol   Best practice (evaluated daily)   Diet: Enteric nutrition Pain/Anxiety/Delirium protocol (if indicated): pRN oxy. TID gaba VAP protocol (if indicated):  DVT prophylaxis: SCDs -- chemical vte ppx per NSGY  GI prophylaxis: PPI Glucose control: CBG q4h Mobility: BR last  date of multidisciplinary goals of care discussion N/A Family and staff present N/A Summary of discussion N/A Follow up goals of care discussion due -- per primary 12/17: Caregiver updated at bedside Code Status: Full code.  Disposition: ICU  Labs   CBC: Recent Labs  Lab 10/30/20 0646 10/31/20 0510 11/02/20 1726 11/03/20 0221 11/04/20 0232  WBC 14.8* 17.8*  --   --  20.7*  NEUTROABS  --   --   --   --  17.5*  HGB 12.8* 11.7* 10.2* 9.9* 9.2*  HCT 38.9* 36.1* 30.0* 29.0* 30.0*  MCV 92.2 92.8  --   --  98.4  PLT 198 199  --   --  160    Basic Metabolic Panel: Recent Labs  Lab 10/29/20 1705 10/29/20 1705 10/30/20 0646 10/30/20 1708 10/31/20 0510 11/01/20 1047 11/02/20 1529 11/02/20 1726 11/03/20 0221 11/03/20 1055 11/04/20 0232 11/05/20 0141  NA  --    < > 141  --  147* 145  --  151* 152* 155* 155* 156*  K  --    < > 3.7  --  3.0* 3.2* 4.3 4.1 3.9 4.0 3.5 3.4*  CL  --    < > 102  --  107 104  --   --   --  112* 112* 113*  CO2  --    < > 26  --  31 26  --   --   --  32 30 31  GLUCOSE  --    < > 170*  --  100* 181*  --   --   --  143* 202* 146*  BUN  --    < > 33*  --  27* 39*  --   --   --  52* 52* 48*  CREATININE  --    < > 0.91  --  0.88 1.07  --   --   --  1.22 1.31* 1.23  CALCIUM  --    < > 8.9  --  8.4* 8.2*  --   --   --  8.6* 8.5* 8.2*  MG 2.0  --  2.2 2.2 2.3  --   --   --   --   --   --   --   PHOS 3.2  --  1.7* 1.7* 1.5* 2.5 4.0  --   --   --   --   --    < > = values in this interval not displayed.   GFR: Estimated Creatinine Clearance: 69.6 mL/min (by C-G formula based on SCr of 1.23 mg/dL). Recent Labs  Lab 10/30/20 0646 10/31/20 0510 11/04/20 0232  WBC 14.8* 17.8* 20.7*    Liver Function Tests: No results for input(s): AST, ALT, ALKPHOS, BILITOT, PROT, ALBUMIN in the last 168 hours. No results for input(s): LIPASE, AMYLASE in the last 168 hours. No results for input(s): AMMONIA in the last 168 hours.  ABG    Component Value Date/Time    PHART 7.442 11/03/2020 0221   PCO2ART 50.0 (H) 11/03/2020 0221   PO2ART 61 (L) 11/03/2020 0221   HCO3 34.0 (H) 11/03/2020 0221   TCO2 35 (H) 11/03/2020 0221   O2SAT 91.0 11/03/2020 0221  Coagulation Profile: No results for input(s): INR, PROTIME in the last 168 hours.  Cardiac Enzymes: No results for input(s): CKTOTAL, CKMB, CKMBINDEX, TROPONINI in the last 168 hours.  HbA1C: Hgb A1c MFr Bld  Date/Time Value Ref Range Status  10/26/2020 06:35 PM 6.9 (H) 4.8 - 5.6 % Final    Comment:    (NOTE) Pre diabetes:          5.7%-6.4%  Diabetes:              >6.4%  Glycemic control for   <7.0% adults with diabetes     CBG: Recent Labs  Lab 11/04/20 1103 11/04/20 1501 11/04/20 1939 11/04/20 2328 11/05/20 0343  GLUCAP 187* 160* 161* 180* 163*   CRITICAL CARE Performed by: Cristal Generous   Total critical care time: 40 minutes  Critical care time was exclusive of separately billable procedures and treating other patients.  Critical care was necessary to treat or prevent imminent or life-threatening deterioration.  Critical care was time spent personally by me on the following activities: development of treatment plan with patient and/or surrogate as well as nursing, discussions with consultants, evaluation of patient's response to treatment, examination of patient, obtaining history from patient or surrogate, ordering and performing treatments and interventions, ordering and review of laboratory studies, ordering and review of radiographic studies, pulse oximetry and re-evaluation of patient's condition.   Eliseo Gum MSN, AGACNP-BC [place] [phone] If no answer, [phone] 11/05/2020, 7:36 AM

## 2020-11-06 ENCOUNTER — Inpatient Hospital Stay (HOSPITAL_COMMUNITY): Payer: Medicare Other

## 2020-11-06 LAB — BASIC METABOLIC PANEL
Anion gap: 11 (ref 5–15)
BUN: 44 mg/dL — ABNORMAL HIGH (ref 8–23)
CO2: 29 mmol/L (ref 22–32)
Calcium: 8.5 mg/dL — ABNORMAL LOW (ref 8.9–10.3)
Chloride: 115 mmol/L — ABNORMAL HIGH (ref 98–111)
Creatinine, Ser: 1.15 mg/dL (ref 0.61–1.24)
GFR, Estimated: >60 mL/min (ref >60)
Glucose, Bld: 208 mg/dL — ABNORMAL HIGH (ref 70–99)
Potassium: 3.4 mmol/L — ABNORMAL LOW (ref 3.5–5.1)
Sodium: 155 mmol/L — ABNORMAL HIGH (ref 135–145)

## 2020-11-06 LAB — CBC
HCT: 30.5 % — ABNORMAL LOW (ref 39.0–52.0)
Hemoglobin: 9.2 g/dL — ABNORMAL LOW (ref 13.0–17.0)
MCH: 30.3 pg (ref 26.0–34.0)
MCHC: 30.2 g/dL (ref 30.0–36.0)
MCV: 100.3 fL — ABNORMAL HIGH (ref 80.0–100.0)
Platelets: 194 10*3/uL (ref 150–400)
RBC: 3.04 MIL/uL — ABNORMAL LOW (ref 4.22–5.81)
RDW: 14.8 % (ref 11.5–15.5)
WBC: 16.8 10*3/uL — ABNORMAL HIGH (ref 4.0–10.5)
nRBC: 0 % (ref 0.0–0.2)

## 2020-11-06 LAB — CULTURE, RESPIRATORY W GRAM STAIN

## 2020-11-06 LAB — GLUCOSE, CAPILLARY
Glucose-Capillary: 234 mg/dL — ABNORMAL HIGH (ref 70–99)
Glucose-Capillary: 194 mg/dL — ABNORMAL HIGH (ref 70–99)
Glucose-Capillary: 132 mg/dL — ABNORMAL HIGH (ref 70–99)
Glucose-Capillary: 112 mg/dL — ABNORMAL HIGH (ref 70–99)
Glucose-Capillary: 166 mg/dL — ABNORMAL HIGH (ref 70–99)
Glucose-Capillary: 154 mg/dL — ABNORMAL HIGH (ref 70–99)

## 2020-11-06 MED ORDER — DEXTROSE 5 % IV SOLN: INTRAVENOUS | Status: DC

## 2020-11-06 MED ORDER — SODIUM CHLORIDE 0.9 % IV SOLN: INTRAVENOUS | Status: DC | PRN

## 2020-11-06 MED ORDER — POTASSIUM CHLORIDE 20 MEQ PO PACK
40.0000 meq | PACK | Freq: Once | ORAL | Status: AC
  Administered 2020-11-06: 06:00:00 40 meq
  Filled 2020-11-06: qty 2

## 2020-11-06 MED ORDER — ADULT MULTIVITAMIN W/MINERALS CH
1.0000 | ORAL_TABLET | Freq: Every day | ORAL | Status: DC
  Administered 2020-11-07 – 2020-11-19 (×13): 1
  Filled 2020-11-06 (×13): qty 1

## 2020-11-06 MED ORDER — FREE WATER
400.0000 mL | Freq: Four times a day (QID) | Status: DC
  Administered 2020-11-06 – 2020-11-07 (×4): 400 mL

## 2020-11-06 NOTE — Progress Notes (Signed)
SLP Cancellation Note  Patient Details Name: [name] [name] MRN: [id number] DOB: May 09, 1946   Cancelled treatment:        Pt continues on Bipap which RT states he has required for approximately 3 days. He was initiated on puree, pudding thick liquids after MBS 12/16 and made NPO the following day. Not appropriate for swallow exercises.    [name] [name] 11/06/2020, 11:24 AM   Orbie Pyo Colvin Caroli.Ed Risk analyst [phone] Office [phone]

## 2020-11-06 NOTE — Progress Notes (Signed)
Nutrition Follow-up  DOCUMENTATION CODES:   Not applicable  INTERVENTION:   Encourage PO intake on dysphagia diet; not appropriate to remove cortrak at this time   Tube feeding via Cortrak tube: Pivot 1.5 at 65 ml/h (1560 ml per day)  Provides 2340 kcal, 146 gm protein, 1184 ml free water daily 400 ml free water every 6 hours  Total free water: 2784 ml   MVI with minerals daily    NUTRITION DIAGNOSIS:   Inadequate oral intake related to inability to eat as evidenced by NPO status. Ongoing.   GOAL:   Patient will meet greater than or equal to 90% of their needs Met with TF.   MONITOR:   Diet advancement,TF tolerance  REASON FOR ASSESSMENT:   Consult Enteral/tube feeding initiation and management  ASSESSMENT:   Pt with PMH of cervial stenosis, CHF, OSA, and COPD admitted for ACDF on 12/10.   Pt discussed during ICU rounds and with RN.  Pt requiring BiPAP.    12/10 ACDF 12/11 s/p decompressive laminectomies  12/12 pt with hypoxia due to left pleural effusion and atelectasis  12/13 cortrak placed  12/17 requiring BiPAP support   Medications reviewed and include: vitamin D3, colace, lasix, SSI, 2 units novolog every 4 hours, glucophage, MVI  Labs reviewed: Na 155, K+ 3.4 CBG's: [phone]     Diet Order:   Diet Order    None      EDUCATION NEEDS:   No education needs have been identified at this time  Skin:  Skin Assessment:  (MARSI - penis) Skin Integrity Issues:: Stage II Stage II: perineum  Last BM:  12/17  Height:   Ht Readings from Last 1 Encounters:  11/01/20 6' 2" (1.88 m)    Weight:   Wt Readings from Last 1 Encounters:  11/06/20 118.8 kg    Ideal Body Weight:  86.3 kg  BMI:  Body mass index is 33.63 kg/m.  Estimated Nutritional Needs:   Kcal:  2300-2500  Protein:  130-150 grams  Fluid:  2 L/day  Lockie Pares., RD, LDN, CNSC See AMiON for contact information

## 2020-11-06 NOTE — Progress Notes (Signed)
K+ 3.4 Replaced per protocol  

## 2020-11-06 NOTE — Progress Notes (Signed)
Patient ID: [name] [name], male   DOB: [birth year], 75 y.o.   MRN: [id number] BP (!) 111/49 (BP Location: Left Arm)   Pulse 66   Temp 99.2 F (37.3 C) (Axillary)   Resp 19   Ht 6\' 2"  (1.88 m)   Wt 118.8 kg   SpO2 97%   BMI 33.63 kg/m  Lethargic arouses with voice  Will move all extremities Weaker on left than right side Wounds are clean and dry, no signs of infection No respiratory improvement

## 2020-11-06 NOTE — Progress Notes (Addendum)
NAME:  [name] [name], MRN:  [id number], DOB:  1946/10/30, LOS: 51 ADMISSION DATE:  10/26/2020, CONSULTATION DATE:  10/28/20 REFERRING MD:  Sherley Bounds, MD CHIEF COMPLAINT:  Acute hypoxemic respiratory failure  Brief History   74 year old s/p posterior cervical laminectomy  History of present illness   [name] [name] is a 74 year old male with cervical stenosis. Admitted for ACDF on 12/10 and developed LUE weakness post-op requiring posterior cervical decompression on 12/11. PCCM called on 12/12 for post-op hypoxemia. Yesterday was on RA and needed supplemental O2 this morning which has now increased to NRB. Given lasix 20 mg. He is on NIV at home for OSA. Has not worn device since his first surgery.  Past Medical History  Cervical stenosis, chronic diastolic heart failure, OSA, COPD, neuropathy  Significant Hospital Events   12/10 ACDF C3/4 C5/6 12/11 Posterior cervical decompression C3-6 12/12 PCCM consulted for post-op hypoxemia 12/17-respiratory decompensation with hypoxemia, now on BiPAP  Consults:  NSG PCCM  Procedures:    Significant Diagnostic Tests:   MRI cord signal change C3-4 C5-6 CXR 10/28/20 - Left atelectasis CXR 12/13> increasing L opacity -- effusion vs obstructive atelectasis  Chest x-ray 12/16-left lower lobe atelectasis, small effusion 12/21 CXR> L sided opacification.   Micro Data:  SARS Covid 12/11 - neg  Antimicrobials:  12/12 Unasyn -12/17 12/19 Rocephin >   Interim history/subjective:  Remains on BiPAP - FiO2 down to 50% with SpO2 98% Significantly more awake and interactive this morning  Na 155 K 3.4 -- corrected overnight   CXR with L sided dense opacification  Objective   Blood pressure 135/71, pulse 64, temperature 99 F (37.2 C), temperature source Axillary, resp. rate (!) 21, height 6\' 2"  (1.88 m), weight 118.8 kg, SpO2 96 %.    FiO2 (%):  [60 %-100 %] 60 %   Intake/Output Summary (Last 24 hours) at 11/06/2020 0731 Last data  filed at 11/06/2020 0552 Gross per 24 hour  Intake 1872.1 ml  Output 1795 ml  Net 77.1 ml   Filed Weights   11/01/20 0749 11/02/20 0500 11/06/20 0600  Weight: 118 kg 117.5 kg 118.8 kg    Physical Exam: General: Acutely and chronically ill appearing older adult M, reclined in bed NAD on BiPAP HENT: BiPAP in place. Cortrak secure. Anicteric sclera. Pink mm Respiratory: Diminished L sided sounds. Unlabored respirations on BiPAP. Cardiovascular: RRR s1s2 no rgm cap refill < 3 sec  GI: Soft round ndnt +bowel sounds  GU: excoriation around penis. Foley catheter  Extremities: No obvious joint deformity. No cyanosis or clubbing. Slightly edematous BUE  Neuro: Awake, slightly drowsy. Following commands. Oriented x 3. Following commands. Improving LUE strength,still weaker than RUE  Skin: c/d/w. Surgical incisions well approximated, no erythema or drainage    Resolved Hospital Problem list     Assessment & Plan:   Multifactorial encephalopathy -cont delirium precautions, promote sleep hygiene, incr mobility during day, bed in chair position  -minimize PRNs as able   Acute on chronic Hypoxic/hypercarbic respiratory failure Atelectasis, multilobar infiltrate-  Suspected mucus plug L (cxr 12/21) -initial suspicion for aspiration, tx with unasyn 12/12-12/17 -worsening resp status, infiltrate on CXR, persistent WBC - Tmax 102.2, thick secretions -- started rocephin 12/19 Presumed Hx COPD  -CPT -rocephin (start date 12/19) -Continues on BiPAP -- goal to wean to [facility] if tolerated  -SpO2 goal > 88%  -Trend CXR  -BDs   Diastolic heart failure -is off of scheduled lasix  -trend fluid status (  has been started on FWF for sodium) -- continue to evaluate for PRN diuresis   S/p ACDF/ laminectomy  LUE allodynia, improving -post op per NSGY  -On gabapentin, PRN OxyIR  -Cont PT/OT/Mobility efforts  Hypertension Hyperlipidemia -Lipitor, Cozaar, metop -PRN labetalol   Diabetes -SSI   -metformin   Hypernatremia Hypokalemia, mild - FWF 471ml q6hr  -d5gtt -replace K -cont to trend BMP   Inadequate PO intake -EN via cortrak   Maceration around penis -continue foley  Best practice (evaluated daily)   Diet: Enteral nutrition Pain/Anxiety/Delirium protocol (if indicated): pRN oxy. TID gaba VAP protocol (if indicated): pulm hygiene  DVT prophylaxis: SQH  GI prophylaxis: PPI Glucose control: CBG q4h Mobility: BR last date of multidisciplinary goals of care discussion N/A Family and staff present N/A Summary of discussion N/A Follow up goals of care discussion due -- per primary 12/20- sister at bedside Attempted to reach [name]-- # in chart, but unable to reach)  Code Status: Full code.  Disposition: ICU  Labs   CBC: Recent Labs  Lab 10/31/20 0510 11/02/20 1726 11/03/20 0221 11/04/20 0232 11/06/20 0225  WBC 17.8*  --   --  20.7* 16.8*  NEUTROABS  --   --   --  17.5*  --   HGB 11.7* 10.2* 9.9* 9.2* 9.2*  HCT 36.1* 30.0* 29.0* 30.0* 30.5*  MCV 92.8  --   --  98.4 100.3*  PLT 199  --   --  192 269    Basic Metabolic Panel: Recent Labs  Lab 10/30/20 1708 10/31/20 0510 10/31/20 0510 11/01/20 1047 11/02/20 1529 11/02/20 1726 11/03/20 0221 11/03/20 1055 11/04/20 0232 11/05/20 0141 11/06/20 0225  NA  --  147*   < > 145  --    < > 152* 155* 155* 156* 155*  K  --  3.0*   < > 3.2* 4.3   < > 3.9 4.0 3.5 3.4* 3.4*  CL  --  107   < > 104  --   --   --  112* 112* 113* 115*  CO2  --  31   < > 26  --   --   --  32 30 31 29   GLUCOSE  --  100*   < > 181*  --   --   --  143* 202* 146* 208*  BUN  --  27*   < > 39*  --   --   --  52* 52* 48* 44*  CREATININE  --  0.88   < > 1.07  --   --   --  1.22 1.31* 1.23 1.15  CALCIUM  --  8.4*   < > 8.2*  --   --   --  8.6* 8.5* 8.2* 8.5*  MG 2.2 2.3  --   --   --   --   --   --   --   --   --   PHOS 1.7* 1.5*  --  2.5 4.0  --   --   --   --   --   --    < > = values in this interval not displayed.    GFR: Estimated Creatinine Clearance: 74.8 mL/min (by C-G formula based on SCr of 1.15 mg/dL). Recent Labs  Lab 10/31/20 0510 11/04/20 0232 11/06/20 0225  WBC 17.8* 20.7* 16.8*    Liver Function Tests: No results for input(s): AST, ALT, ALKPHOS, BILITOT, PROT, ALBUMIN in the last 168 hours. No results for  input(s): LIPASE, AMYLASE in the last 168 hours. No results for input(s): AMMONIA in the last 168 hours.  ABG    Component Value Date/Time   PHART 7.442 11/03/2020 0221   PCO2ART 50.0 (H) 11/03/2020 0221   PO2ART 61 (L) 11/03/2020 0221   HCO3 34.0 (H) 11/03/2020 0221   TCO2 35 (H) 11/03/2020 0221   O2SAT 91.0 11/03/2020 0221     Coagulation Profile: No results for input(s): INR, PROTIME in the last 168 hours.  Cardiac Enzymes: No results for input(s): CKTOTAL, CKMB, CKMBINDEX, TROPONINI in the last 168 hours.  HbA1C: Hgb A1c MFr Bld  Date/Time Value Ref Range Status  10/26/2020 06:35 PM 6.9 (H) 4.8 - 5.6 % Final    Comment:    (NOTE) Pre diabetes:          5.7%-6.4%  Diabetes:              >6.4%  Glycemic control for   <7.0% adults with diabetes     CBG: Recent Labs  Lab 11/05/20 1133 11/05/20 1625 11/05/20 1945 11/05/20 2330 11/06/20 0338  GLUCAP 163* 171* 157* 124* 234*   CRITICAL CARE Performed by: Cristal Generous   Total critical care time: 38  minutes  Critical care time was exclusive of separately billable procedures and treating other patients. Critical care was necessary to treat or prevent imminent or life-threatening deterioration.  Critical care was time spent personally by me on the following activities: development of treatment plan with patient and/or surrogate as well as nursing, discussions with consultants, evaluation of patient's response to treatment, examination of patient, obtaining history from patient or surrogate, ordering and performing treatments and interventions, ordering and review of laboratory studies, ordering and  review of radiographic studies, pulse oximetry and re-evaluation of patient's condition.  Eliseo Gum MSN, AGACNP-BC [place] [phone] If no answer, [id number] 11/06/2020, 7:31 AM

## 2020-11-06 NOTE — Progress Notes (Signed)
Attempted to obtain ABG on patient.  He starting crying and hollering and wouldn't stay still for the procedure.  RN aware.

## 2020-11-06 NOTE — Progress Notes (Signed)
Physical Therapy Treatment Patient Details Name: [name] [name] MRN: [id number] DOB: 15-Jun-1946 Today's Date: 11/06/2020    History of Present Illness Patient is a 74 y/o male who presents s/p C3-4, 4-5 ACDF 12/10 with post op LUE weakness requiring C3-6 posterior decompressive lami 93/81 complicated by post op hypoxemia. PMH includes COPD, prostata CA, HTN, depression, CHF.    PT Comments    Pt on bipap upon arrival to room, agreeable to EOB and OOB mobility attempts. Pt requiring increased physical assist this day, max +2 for bed mobility and attempted standing. Standing attempts x2, pt unable to fully elevate trunk into upright standing. Pt tolerated EOB sitting x15 minutes total, with periods of pt supporting self. PT continuing to recommend CIR post-acutely, will continue to progress pt mobility as appropriate.    Follow Up Recommendations  CIR;Supervision for mobility/OOB;Supervision/Assistance - 24 hour     Equipment Recommendations  None recommended by PT    Recommendations for Other Services       Precautions / Restrictions Precautions Precautions: Fall;Cervical Precaution Comments: bipap; log roll technique utilized for in/out of bed Restrictions Weight Bearing Restrictions: No    Mobility  Bed Mobility Overal bed mobility: Needs Assistance Bed Mobility: Rolling;Sidelying to Sit;Sit to Supine Rolling: Max assist;+2 for safety/equipment Sidelying to sit: +2 for physical assistance;Total assist;+2 for safety/equipment;HOB elevated   Sit to supine: Total assist;+2 for physical assistance;+2 for safety/equipment;HOB elevated   General bed mobility comments: max-total +2 for trunk and LE management, scooting to and from EOB, truncal support once sitting EOB. very increased time, limited by cervical and LUE pain.  Transfers Overall transfer level: Needs assistance Equipment used: Rolling walker (2 wheeled) Transfers: Sit to/from Stand Sit to Stand: Max assist;+2  physical assistance;From elevated surface         General transfer comment: STS x2, max +2 for power up, rise, steady. Use of bed pad under hips for initial power up.  Ambulation/Gait             General Gait Details: Unable   Stairs             Wheelchair Mobility    Modified Rankin (Stroke Patients Only)       Balance Overall balance assessment: Needs assistance Sitting-balance support: Bilateral upper extremity supported;Feet supported Sitting balance-Leahy Scale: Poor Sitting balance - Comments: intermittent min-mod posterior truncal assist to maintain upright, EOB sitting x15 minutes Postural control: Posterior lean Standing balance support: Bilateral upper extremity supported Standing balance-Leahy Scale: Zero Standing balance comment: reliant on external support, max +2                            Cognition Arousal/Alertness: Awake/alert Behavior During Therapy: Flat affect Overall Cognitive Status: Impaired/Different from baseline Area of Impairment: Attention;Following commands;Safety/judgement;Problem solving;Memory                   Current Attention Level: Focused Memory: Decreased recall of precautions Following Commands: Follows one step commands inconsistently Safety/Judgement: Decreased awareness of safety;Decreased awareness of deficits   Problem Solving: Slow processing;Decreased initiation;Requires verbal cues;Requires tactile cues;Difficulty sequencing General Comments: on bipap and difficult to understand. Requires multimodal, repeated cuing to follow commands.      Exercises General Exercises - Upper Extremity Elbow Flexion: PROM;Left;5 reps;Supine Elbow Extension: PROM;Left;5 reps;Supine    General Comments General comments (skin integrity, edema, etc.): vss on bipap; alarm for "vtach" x1 but suspect due to noisy signal, pt asymptomatic  Pertinent Vitals/Pain Pain Assessment: Faces Faces Pain Scale:  Hurts little more Pain Location: neck, LUE Pain Descriptors / Indicators: Guarding;Moaning;Discomfort;Grimacing Pain Intervention(s): Limited activity within patient's tolerance;Monitored during session;Repositioned    Home Living                      Prior Function            PT Goals (current goals can now be found in the care plan section) Acute Rehab PT Goals Patient Stated Goal: to get some ice, to go home PT Goal Formulation: With patient Time For Goal Achievement: 11/12/20 Potential to Achieve Goals: Fair Progress towards PT goals: Progressing toward goals    Frequency    Min 5X/week      PT Plan Current plan remains appropriate    Co-evaluation              AM-PAC PT "6 Clicks" Mobility   Outcome Measure  Help needed turning from your back to your side while in a flat bed without using bedrails?: A Lot Help needed moving from lying on your back to sitting on the side of a flat bed without using bedrails?: A Lot Help needed moving to and from a bed to a chair (including a wheelchair)?: A Lot Help needed standing up from a chair using your arms (e.g., wheelchair or bedside chair)?: A Lot Help needed to walk in hospital room?: Total Help needed climbing 3-5 steps with a railing? : Total 6 Click Score: 10    End of Session Equipment Utilized During Treatment: Oxygen;Gait belt Activity Tolerance: Patient limited by fatigue Patient left: in bed;with call bell/phone within reach;with bed alarm set;with family/visitor present;with SCD's reapplied Nurse Communication: Mobility status;Need for lift equipment PT Visit Diagnosis: Other abnormalities of gait and mobility (R26.89);Pain Pain - Right/Left: Left Pain - part of body: Arm     Time: 8527-7824 PT Time Calculation (min) (ACUTE ONLY): 27 min  Charges:  $Therapeutic Activity: 8-22 mins $Neuromuscular Re-education: 8-22 mins                     Stacie Glaze, PT Acute Rehabilitation  Services Pager [phone]  Office [phone]   Roxine Caddy E Ruffin Pyo 11/06/2020, 10:53 AM

## 2020-11-07 ENCOUNTER — Inpatient Hospital Stay (HOSPITAL_COMMUNITY): Payer: Medicare Other

## 2020-11-07 DIAGNOSIS — J189 Pneumonia, unspecified organism: Secondary | ICD-10-CM | POA: Diagnosis not present

## 2020-11-07 DIAGNOSIS — J9601 Acute respiratory failure with hypoxia: Secondary | ICD-10-CM | POA: Diagnosis not present

## 2020-11-07 LAB — CBC
HCT: 27.4 % — ABNORMAL LOW (ref 39.0–52.0)
Hemoglobin: 8.8 g/dL — ABNORMAL LOW (ref 13.0–17.0)
MCH: 31 pg (ref 26.0–34.0)
MCHC: 32.1 g/dL (ref 30.0–36.0)
MCV: 96.5 fL (ref 80.0–100.0)
Platelets: 216 10*3/uL (ref 150–400)
RBC: 2.84 MIL/uL — ABNORMAL LOW (ref 4.22–5.81)
RDW: 14.6 % (ref 11.5–15.5)
WBC: 15.3 10*3/uL — ABNORMAL HIGH (ref 4.0–10.5)
nRBC: 0.1 % (ref 0.0–0.2)

## 2020-11-07 LAB — BASIC METABOLIC PANEL
Anion gap: 9 (ref 5–15)
BUN: 40 mg/dL — ABNORMAL HIGH (ref 8–23)
CO2: 29 mmol/L (ref 22–32)
Calcium: 8.6 mg/dL — ABNORMAL LOW (ref 8.9–10.3)
Chloride: 118 mmol/L — ABNORMAL HIGH (ref 98–111)
Creatinine, Ser: 1.19 mg/dL (ref 0.61–1.24)
GFR, Estimated: >60 mL/min (ref >60)
Glucose, Bld: 113 mg/dL — ABNORMAL HIGH (ref 70–99)
Potassium: 3.5 mmol/L (ref 3.5–5.1)
Sodium: 156 mmol/L — ABNORMAL HIGH (ref 135–145)

## 2020-11-07 LAB — GLUCOSE, CAPILLARY
Glucose-Capillary: 110 mg/dL — ABNORMAL HIGH (ref 70–99)
Glucose-Capillary: 150 mg/dL — ABNORMAL HIGH (ref 70–99)
Glucose-Capillary: 154 mg/dL — ABNORMAL HIGH (ref 70–99)
Glucose-Capillary: 159 mg/dL — ABNORMAL HIGH (ref 70–99)
Glucose-Capillary: 160 mg/dL — ABNORMAL HIGH (ref 70–99)
Glucose-Capillary: 165 mg/dL — ABNORMAL HIGH (ref 70–99)

## 2020-11-07 MED ORDER — SODIUM CHLORIDE 0.9 % IV SOLN
2.0000 g | Freq: Three times a day (TID) | INTRAVENOUS | Status: AC
  Administered 2020-11-07 – 2020-11-13 (×19): 2 g via INTRAVENOUS
  Filled 2020-11-07 (×20): qty 2

## 2020-11-07 MED ORDER — POTASSIUM CHLORIDE 20 MEQ PO PACK
40.0000 meq | PACK | Freq: Once | ORAL | Status: AC
  Administered 2020-11-07: 06:00:00 40 meq
  Filled 2020-11-07: qty 2

## 2020-11-07 MED ORDER — COLLAGENASE 250 UNIT/GM EX OINT
TOPICAL_OINTMENT | Freq: Every day | CUTANEOUS | Status: DC
  Administered 2020-11-17: 1 via TOPICAL
  Filled 2020-11-07: qty 30

## 2020-11-07 MED ORDER — GUAIFENESIN 100 MG/5ML PO SOLN
15.0000 mL | ORAL | Status: DC
  Administered 2020-11-07 – 2020-11-22 (×79): 300 mg
  Filled 2020-11-07 (×7): qty 15
  Filled 2020-11-07: qty 10
  Filled 2020-11-07: qty 20
  Filled 2020-11-07: qty 15
  Filled 2020-11-07: qty 20
  Filled 2020-11-07 (×12): qty 15
  Filled 2020-11-07: qty 10
  Filled 2020-11-07: qty 15
  Filled 2020-11-07: qty 10
  Filled 2020-11-07 (×2): qty 15
  Filled 2020-11-07: qty 20
  Filled 2020-11-07 (×10): qty 15
  Filled 2020-11-07: qty 20
  Filled 2020-11-07: qty 10
  Filled 2020-11-07: qty 20
  Filled 2020-11-07 (×3): qty 15
  Filled 2020-11-07: qty 10
  Filled 2020-11-07 (×5): qty 15
  Filled 2020-11-07: qty 10
  Filled 2020-11-07 (×3): qty 15
  Filled 2020-11-07 (×2): qty 10
  Filled 2020-11-07 (×5): qty 15
  Filled 2020-11-07: qty 20
  Filled 2020-11-07 (×2): qty 15
  Filled 2020-11-07: qty 10
  Filled 2020-11-07 (×2): qty 20
  Filled 2020-11-07 (×2): qty 15
  Filled 2020-11-07: qty 20
  Filled 2020-11-07 (×5): qty 15
  Filled 2020-11-07: qty 10
  Filled 2020-11-07 (×2): qty 15

## 2020-11-07 MED ORDER — POTASSIUM CHLORIDE 20 MEQ PO PACK
40.0000 meq | PACK | Freq: Once | ORAL | Status: AC
  Administered 2020-11-07: 16:00:00 40 meq
  Filled 2020-11-07: qty 2

## 2020-11-07 MED ORDER — FREE WATER
400.0000 mL | Status: DC
  Administered 2020-11-07 – 2020-11-12 (×30): 400 mL

## 2020-11-07 NOTE — Progress Notes (Signed)
Occupational Therapy Treatment Patient Details Name: CHRISTOFER SHEN MRN: [id number] DOB: 11/29/1945 Today's Date: 11/07/2020    History of present illness Patient is a 74 y/o male who presents s/p C3-4, 4-5 ACDF 12/10 with post op LUE weakness requiring C3-6 posterior decompressive lami 46/96 complicated by post op hypoxemia. PMH includes COPD, prostata CA, HTN, depression, CHF.   OT comments  Pt with gradual progress towards OT goals, seen in conjunction with PT with focus on progressing OOB to recliner this session. Pt tolerating session well; he continues to require two person assist for all aspects of mobility. Pt fatigues quickly and benefits from rest breaks. SpO2 maintaining >/=90% throughout on heated HFNC (30L, 80%) with additional VSS. Will continue per POC at this time.   Follow Up Recommendations  CIR;Supervision/Assistance - 24 hour    Equipment Recommendations  3 in 1 bedside commode          Precautions / Restrictions Precautions Precautions: Fall;Cervical Precaution Comments: heated HFNC Restrictions Weight Bearing Restrictions: No       Mobility Bed Mobility Overal bed mobility: Needs Assistance Bed Mobility: Supine to Sit     Supine to sit: +2 for physical assistance;+2 for safety/equipment;Max assist     General bed mobility comments: assist to initiate LE movements and for trunk elevation, up via R elbow  Transfers Overall transfer level: Needs assistance Equipment used: 2 person hand held assist Transfers: Sit to/from Stand;Stand Pivot Transfers Sit to Stand: Max assist;+2 physical assistance;+2 safety/equipment Stand pivot transfers: Max assist;+2 physical assistance;+2 safety/equipment       General transfer comment: heavy two person assist but pt is able to assist with power up to stand and initiates stepping LEs, use of bedpad to assist with supporting hips    Balance Overall balance assessment: Needs assistance Sitting-balance support:  Single extremity supported;Bilateral upper extremity supported;Feet supported Sitting balance-Leahy Scale: Poor Sitting balance - Comments: working on reaching and maintaining midline, tends to fluctuate between lateral/anterior lean   Standing balance support: Bilateral upper extremity supported Standing balance-Leahy Scale: Zero                             ADL either performed or assessed with clinical judgement   ADL Overall ADL's : Needs assistance/impaired     Grooming: Minimal assistance;Sitting;Wash/dry face                               Functional mobility during ADLs: Maximal assistance;+2 for physical assistance;+2 for safety/equipment (stand pivot)                         Cognition Arousal/Alertness: Awake/alert Behavior During Therapy: Flat affect;WFL for tasks assessed/performed Overall Cognitive Status: Impaired/Different from baseline Area of Impairment: Attention;Following commands;Awareness;Problem solving                   Current Attention Level: Sustained Memory: Decreased recall of precautions;Decreased short-term memory Following Commands: Follows one step commands with increased time Safety/Judgement: Decreased awareness of safety;Decreased awareness of deficits Awareness: Emergent Problem Solving: Slow processing;Decreased initiation;Requires verbal cues;Requires tactile cues General Comments: remains slow to process but more interactive today compared to previous sessions        Exercises Exercises: Other exercises Other Exercises Other Exercises: warm up AAROM to Bil LE and UE's   Shoulder Instructions       General Comments  Pertinent Vitals/ Pain       Pain Assessment: Faces Faces Pain Scale: Hurts little more Pain Location: neck, LUE Pain Descriptors / Indicators: Guarding;Moaning;Discomfort;Grimacing Pain Intervention(s): Monitored during session;Repositioned  Home Living                                           Prior Functioning/Environment              Frequency  Min 2X/week        Progress Toward Goals  OT Goals(current goals can now be found in the care plan section)  Progress towards OT goals: Progressing toward goals  Acute Rehab OT Goals Patient Stated Goal: home OT Goal Formulation: With patient Time For Goal Achievement: 11/12/20 Potential to Achieve Goals: Good ADL Goals Pt Will Perform Grooming: with set-up;with supervision;sitting Pt Will Perform Upper Body Dressing: with min assist;sitting Pt Will Perform Lower Body Dressing: with mod assist;sit to/from stand;with adaptive equipment Pt Will Transfer to Toilet: with min assist;stand pivot transfer;bedside commode Additional ADL Goal #1: Pt will perform bed mobility with Min A in preparation for ADLs  Plan Discharge plan remains appropriate    Co-evaluation    PT/OT/SLP Co-Evaluation/Treatment: Yes Reason for Co-Treatment: Complexity of the patient's impairments (multi-system involvement);For patient/therapist safety;To address functional/ADL transfers   OT goals addressed during session: ADL's and self-care      AM-PAC OT "6 Clicks" Daily Activity     Outcome Measure   Help from another person eating meals?: Total Help from another person taking care of personal grooming?: A Lot Help from another person toileting, which includes using toliet, bedpan, or urinal?: Total Help from another person bathing (including washing, rinsing, drying)?: Total Help from another person to put on and taking off regular upper body clothing?: Total Help from another person to put on and taking off regular lower body clothing?: Total 6 Click Score: 7    End of Session Equipment Utilized During Treatment: Oxygen (30L, 80%)  OT Visit Diagnosis: Unsteadiness on feet (R26.81);Other abnormalities of gait and mobility (R26.89);Muscle weakness (generalized) (M62.81);Pain Pain - Right/Left:  Left Pain - part of body: Arm   Activity Tolerance Patient tolerated treatment well   Patient Left in chair;with call bell/phone within reach;with chair alarm set;with family/visitor present   Nurse Communication Mobility status;Need for lift equipment        Time: [id number] OT Time Calculation (min): 38 min  Charges: OT General Charges $OT Visit: 1 Visit OT Treatments $Therapeutic Activity: 23-37 mins  Lou Cal, OT Acute Rehabilitation Services Pager [phone] Office [place] 11/07/2020, 5:30 PM

## 2020-11-07 NOTE — Consult Note (Signed)
WOC Nurse Consult Note: Patient receiving care in [facility]N26. I spoke with the primary RN, Amy, at the time of my assessment. Reason for Consult:"BiPAP related pressure injury" Wound type: DTPI and unstageable to the bridge of the nose Pressure Injury POA: No Measurement: The entire injury measures 2.5 cm x 3.2 cm x unknown depth Wound bed: the upper half is maroon in color and consistent with a DTPI. The lower half is eschar Drainage (amount, consistency, odor) none Periwound: intact Dressing procedure/placement/frequency: Apply Santyl to nose eschar in a nickel thick layer. Cover with a saline moistened gauze, then foam.  Change daily. I think the patient might benefit from a surgical consult.  I have spoken to Amy about the potential benefit of asking for a surgical consult. Monitor the wound area(s) for worsening of condition such as: Signs/symptoms of infection,  Increase in size,  Development of or worsening of odor, Development of pain, or increased pain at the affected locations.  Notify the medical team if any of these develop.  Thank you for the consult.  Discussed plan of care with the bedside nurse.  Val Riles, RN, MSN, CWOCN, CNS-BC, pager [phone]

## 2020-11-07 NOTE — Plan of Care (Signed)
  Problem: Clinical Measurements: Goal: Ability to maintain clinical measurements within normal limits will improve Outcome: Progressing Goal: Will remain free from infection Outcome: Progressing Goal: Cardiovascular complication will be avoided Outcome: Progressing   Problem: Activity: Goal: Risk for activity intolerance will decrease Outcome: Progressing   Problem: Nutrition: Goal: Adequate nutrition will be maintained Outcome: Progressing   Problem: Elimination: Goal: Will not experience complications related to bowel motility Outcome: Progressing Goal: Will not experience complications related to urinary retention Outcome: Progressing   Problem: Safety: Goal: Ability to remain free from injury will improve Outcome: Progressing

## 2020-11-07 NOTE — Progress Notes (Addendum)
NAME:  [name] [name], MRN:  [id number], DOB:  [birth year], LOS: 12 ADMISSION DATE:  10/26/2020, CONSULTATION DATE:  10/28/20 REFERRING MD:  Sherley Bounds, MD CHIEF COMPLAINT:  Acute hypoxemic respiratory failure  Brief History   74 year old s/p posterior cervical laminectomy, hospital course complicated by respiratory failure, PNA, and mucous plugging.   History of present illness   [name] is a 74 year old male with cervical stenosis. Admitted for ACDF on 12/10 and developed LUE weakness post-op requiring posterior cervical decompression on 12/11. PCCM called on 12/12 for post-op hypoxemia. Yesterday was on RA and needed supplemental O2 this morning which has now increased to NRB. Given lasix 20 mg. He is on NIV at home for OSA. Has not worn device since his first surgery.  Past Medical History  Cervical stenosis, chronic diastolic heart failure, OSA, COPD, neuropathy  Significant Hospital Events   12/10 ACDF C3/4 C5/6 12/11 Posterior cervical decompression C3-6 12/12 PCCM consulted for post-op hypoxemia 12/17-respiratory decompensation with hypoxemia, now on BiPAP 12/21 -Remains on BiPAP - FiO2 down to 50% with SpO2 98% Significantly more awake and interactive this morning, CXR - mucus plugging on Lt with volume loss   Consults:  NSG PCCM  Procedures:  12/13 cortrak > 12/18 foley >>   Significant Diagnostic Tests:   MRI cord signal change C3-4 C5-6 CXR 10/28/20 - Left atelectasis CXR 12/13> increasing L opacity -- effusion vs obstructive atelectasis  Chest x-ray 12/16-left lower lobe atelectasis, small effusion 12/21 CXR> mucus plugging on Lt with volume loss   Micro Data:  SARS Covid 12/11 - neg 12/19 trach asp >> proteus vulgaris (resistant to ampicillin/ cefazolin)  Antimicrobials:  12/12 Unasyn -12/17 12/19 Rocephin > 12/22 12/22 cefepime >>  Interim history/subjective:  Febrile up to 101.8 overnight  Off BiPAP to [place] this morning tolerating thus  far Wound to bridge of nose noted after coming off BiPAP Able to cough and clear secretions  Objective   Blood pressure (!) 165/92, pulse 74, temperature (!) 101.1 F (38.4 C), temperature source Axillary, resp. rate (!) 26, height 6\' 2"  (1.88 m), weight 118.8 kg, SpO2 98 %.    FiO2 (%):  [40 %-100 %] 100 %   Intake/Output Summary (Last 24 hours) at 11/07/2020 0919 Last data filed at 11/07/2020 0800 Gross per 24 hour  Intake 3251.65 ml  Output 1450 ml  Net 1801.65 ml   Filed Weights   11/01/20 0749 11/02/20 0500 11/06/20 0600  Weight: 118 kg 117.5 kg 118.8 kg    Physical Exam: General:  Chronically ill appearing obese, adult male sitting upright in bed on HHFNC in no distress HEENT: wound to bridge of nose, dry mucous membranes, neck incision well approximated, no erythema/ drainage, cortrak  Neuro: alert, oriented, f/c, RUE 2/5,  CV: rr, no murmur PULM:  Non labored, diffuse rhonchi, strong cough, no wheeze GI: obese, soft, bs active, foley  Extremities: warm/dry, generalized edema +1 (more upper extremities) Skin: no rashes    CXR 12/22- better aeration of left lung, mild bibasilar atelectasis  Glucose stable/ within goal  Na 156, K 3.5, increasing CL 115-> 118 Stable sCr / UOP 971ml/ 24hr WBC 20.7-> 16.8-> 15.3 +1.6/ net +7.7 L  Resolved Hospital Problem list     Assessment & Plan:   Multifactorial encephalopathy - continue to minimize sedation - ongoing neuro checks - delirium precautions - push PT/OT efforts   Acute on chronic Hypoxic/hypercarbic respiratory failure Proteus vulgaris PNA with mucous plugging -initial  suspicion for aspiration, tx with unasyn 12/12-12/17 -worsening resp status, infiltrate on CXR, persistent WBC - Tmax 102.2, thick secretions -- started rocephin 12/19; cx 12/21 proteus vulgaris, resistant to ampicillin, ongoing fever  Presumed Hx COPD  - continue HHFNC and BiPAP PRN - SPO2 goal > 88% - aggressive pulmonary hygiene- push PT  efforts - continue albuterol nebs, brovanna/ pulmicort, consider stopping yupelri nebs with mucous plugging - add mucinex - change ceftriaxone to cefepime based on pharm recs  - continue to trend WBC/ fever curve  - trend CXR   Diastolic heart failure - holding on diuresis given hypernatremia and mucous plugging.  Remains Net +7L, continue to monitor closely   S/p ACDF/ laminectomy C3-C6 12/11 LUE allodynia, improving - post op per NSGY  - continue gabapentin, PRN OxyIR, valium for muscle spasms - continue PT/OT/Mobility efforts/ SLP   Hypertension Hyperlipidemia - Lipitor, Cozaar, metop - PRN labetalol   Diabetes - SSI resistant with TF coverage - metformin   Hypernatremia Hypokalemia, mild - increase FWF 458ml q6hr -> q 4hr - continue D5W at 50 ml/hr  - continue daily KCL 40 meq; add additional dose 40 meq this afternoon, K 3.5K - recheck mag in am  - trend BMET  Inadequate PO intake - EN via cortrak at goal   - SLP following  Maceration around penis - continue foley for now  Nasal bridge pressure injury (BiPAP) - WOC consult  Best practice (evaluated daily)   Diet: Enteral nutrition Pain/Anxiety/Delirium protocol (if indicated): pRN oxy. TID gaba VAP protocol (if indicated): pulm hygiene  DVT prophylaxis: SQH  GI prophylaxis: PPI Glucose control: CBG q4h Mobility: BR last date of multidisciplinary goals of care discussion N/A Family and staff present N/A Summary of discussion N/A Follow up goals of care discussion due -- per primary 12/22-  HCPOA is [name].  He is patient's neices husband/ son in law to sister [name]. I updated him by phone and answered all questions.  [name] brought up [name] and stated patient would not want life-prolonging measures if he were to decompensate to that point.  At this time though, I feel patient will be able to recovery, if no major complications, but it will just be a prolonged recovery.   Code Status: Full code Disposition:  ICU, continue for now  Labs   CBC: Recent Labs  Lab 11/02/20 1726 11/03/20 0221 11/04/20 0232 11/06/20 0225 11/07/20 0337  WBC  --   --  20.7* 16.8* 15.3*  NEUTROABS  --   --  17.5*  --   --   HGB 10.2* 9.9* 9.2* 9.2* 8.8*  HCT 30.0* 29.0* 30.0* 30.5* 27.4*  MCV  --   --  98.4 100.3* 96.5  PLT  --   --  192 194 809    Basic Metabolic Panel: Recent Labs  Lab 11/01/20 1047 11/02/20 1529 11/02/20 1726 11/03/20 1055 11/04/20 0232 11/05/20 0141 11/06/20 0225 11/07/20 0337  NA 145  --    < > 155* 155* 156* 155* 156*  K 3.2* 4.3   < > 4.0 3.5 3.4* 3.4* 3.5  CL 104  --   --  112* 112* 113* 115* 118*  CO2 26  --   --  32 30 31 29 29   GLUCOSE 181*  --   --  143* 202* 146* 208* 113*  BUN 39*  --   --  52* 52* 48* 44* 40*  CREATININE 1.07  --   --  1.22 1.31* 1.23 1.15 1.19  CALCIUM 8.2*  --   --  8.6* 8.5* 8.2* 8.5* 8.6*  PHOS 2.5 4.0  --   --   --   --   --   --    < > = values in this interval not displayed.   GFR: Estimated Creatinine Clearance: 72.3 mL/min (by C-G formula based on SCr of 1.19 mg/dL). Recent Labs  Lab 11/04/20 0232 11/06/20 0225 11/07/20 0337  WBC 20.7* 16.8* 15.3*    Liver Function Tests: No results for input(s): AST, ALT, ALKPHOS, BILITOT, PROT, ALBUMIN in the last 168 hours. No results for input(s): LIPASE, AMYLASE in the last 168 hours. No results for input(s): AMMONIA in the last 168 hours.  ABG    Component Value Date/Time   PHART 7.442 11/03/2020 0221   PCO2ART 50.0 (H) 11/03/2020 0221   PO2ART 61 (L) 11/03/2020 0221   HCO3 34.0 (H) 11/03/2020 0221   TCO2 35 (H) 11/03/2020 0221   O2SAT 91.0 11/03/2020 0221     Coagulation Profile: No results for input(s): INR, PROTIME in the last 168 hours.  Cardiac Enzymes: No results for input(s): CKTOTAL, CKMB, CKMBINDEX, TROPONINI in the last 168 hours.  HbA1C: Hgb A1c MFr Bld  Date/Time Value Ref Range Status  10/26/2020 06:35 PM 6.9 (H) 4.8 - 5.6 % Final    Comment:    (NOTE) Pre  diabetes:          5.7%-6.4%  Diabetes:              >6.4%  Glycemic control for   <7.0% adults with diabetes     CBG: Recent Labs  Lab 11/06/20 1552 11/06/20 1951 11/06/20 2327 11/07/20 0343 11/07/20 [place], ACNP [facility] 11/07/2020, 9:19 AM

## 2020-11-07 NOTE — Progress Notes (Signed)
  Speech Language Pathology Treatment: Dysphagia  Patient Details Name: [name] [name] MRN: [id number] DOB: January 19, 1946 Today's Date: 11/07/2020 Time: 5009-3818 SLP Time Calculation (min) (ACUTE ONLY): 20 min  Assessment / Plan / Recommendation Clinical Impression  [name] [name] was able to come off Bipap after approximately 3-4 days to 30 L HFNC. RN reported pt had copious amounts of phlegm/debris suctioned this morning. MBS 12/16 and initiated puree, pudding thick liquids and next day required had respiratory decline. Top layer of tongue significantly flaking requiring oral care for majority of session. Ice chip assisted in loosening and debris reduction. Pharyngeal/laryngeal secretions audible and suctioned. PO's not administered for the purposes of potential food/liquid at present. He has Cortrak and ST will follow to resume diet rec's after MBS versus objective assessment.                            HPI HPI: Pt is a 74 yo male s/p anterior cervical discetomy at C3/4 and C5/6 following removal of existing plate, admitted afterwards for sudden onset weakness and pain in his LUE. CT unremarkable; MRI pending. On 12/11 he was made NPO for difficulty swallowing. On 12/11 he underwent C3-6 posterior decompressive laminectomies during which he was intubated. PMH includes: thyroid ca, prostate ca, HTN, HLD, dyspnea, depression, COPD, CHF, anxiety      SLP Plan  Continue with current plan of care       Recommendations  Diet recommendations: NPO Medication Administration: Via alternative means                Oral Care Recommendations: Oral care QID Follow up Recommendations: Skilled Nursing facility SLP Visit Diagnosis: Dysphagia, pharyngoesophageal phase (R13.14);Dysphagia, pharyngeal phase (R13.13) Plan: Continue with current plan of care       GO                [name] [name] 11/07/2020, 2:57 PM  [name] [name].Ed Risk analyst  [phone] Office [phone]

## 2020-11-07 NOTE — Progress Notes (Signed)
K+ 3.5  Replaced per protocol  

## 2020-11-07 NOTE — Progress Notes (Signed)
Patient placed on heated high flow Fossil. RN at bedside. RN made aware of wound on nose from bipap mask. Patients mouth cleaned out and suctioned with thick secretions. Patient has good cough. RT will continue to monitor.

## 2020-11-07 NOTE — Progress Notes (Signed)
Kennieth Rad NP and Dr. Halford Chessman at bedside aware of BiPAP related pressure injury on nasal bridge.

## 2020-11-07 NOTE — Progress Notes (Signed)
BP 120/65    Pulse 63    Temp (!) 100.4 F (38 C) (Axillary)    Resp 20    Ht 6\' 2"  (1.88 m)    Wt 118.8 kg    SpO2 92%    BMI 33.63 kg/m  Lethargic, will follow commands Moving all extremities Off the bipap,breathing better. Lung sounds with rhonci Improved pulmonary status, but slow improvement

## 2020-11-07 NOTE — Progress Notes (Addendum)
Pharmacy Antibiotic Note  [name] [name] is a 74 y.o. male admitted on 10/26/2020 with pneumonia with abundant proteus vulgaris growing on tracheal aspirate culture.  Pharmacy has been consulted for cefepime dosing. Pt has received 3 days of  Ceftriaxone but has persistent fever with Tmax of 101.65F. WBC 15.3 and downtrending.   Plan: Discontinue ceftriaxone Cefepime 2g IV q8h  F/u renal function, fever trend, and WBC F/u clinical progress and LOT   Height: 6\' 2"  (188 cm) Weight: 118.8 kg (261 lb 14.5 oz) IBW/kg (Calculated) : 82.2  Temp (24hrs), Avg:100.5 F (38.1 C), Min:99.2 F (37.3 C), Max:101.8 F (38.8 C)  Recent Labs  Lab 11/03/20 1055 11/04/20 0232 11/05/20 0141 11/06/20 0225 11/07/20 0337  WBC  --  20.7*  --  16.8* 15.3*  CREATININE 1.22 1.31* 1.23 1.15 1.19    Estimated Creatinine Clearance: 72.3 mL/min (by C-G formula based on SCr of 1.19 mg/dL).    Allergies  Allergen Reactions  . Ibuprofen     Other reaction(s): Angioedema    Antimicrobials this admission: 12/12 unasyn> 12/17 12/19 CTX> 12/22 12/22 Cefepime>    Microbiology results: 12/19 TA- proteus vulgaris (R ampicillin, cefazolin)  12/11 resp panel neg 11/30 MRSA neg  Thank you for allowing pharmacy to be a part of this patient's care.  Carolin Guernsey  PGY1 pharmacy resident 11/07/2020 9:35 AM

## 2020-11-07 NOTE — Progress Notes (Signed)
Physical Therapy Treatment Patient Details Name: [name] [name] MRN: [id number] DOB: 06-Aug-1946 Today's Date: 11/07/2020    History of Present Illness Patient is a 74 y/o male who presents s/p C3-4, 4-5 ACDF 12/10 with post op LUE weakness requiring C3-6 posterior decompressive lami [id number] complicated by post op hypoxemia. PMH includes COPD, prostate CA, HTN, depression, CHF.    PT Comments    Pt a bit drowsy on arrival, but aroused up at EOB.  Worked on sitting balance, midline orientation, activation of trunk, sit to stand and transfers to recliner, pt generally needing max assist of 2 persons.  Vitals stable on HHFNC.    Follow Up Recommendations  CIR;Supervision for mobility/OOB;Supervision/Assistance - 24 hour     Equipment Recommendations  None recommended by PT    Recommendations for Other Services Rehab consult     Precautions / Restrictions Precautions Precautions: Fall;Cervical Precaution Comments: heated HFNC Restrictions Weight Bearing Restrictions: No    Mobility  Bed Mobility Overal bed mobility: Needs Assistance Bed Mobility: Supine to Sit     Supine to sit: +2 for physical assistance;+2 for safety/equipment;Max assist     General bed mobility comments: assist to initiate LE movements and for trunk elevation, up via R elbow  Transfers Overall transfer level: Needs assistance Equipment used: 2 person hand held assist Transfers: Sit to/from [employer] Transfers Sit to Stand: Max assist;+2 physical assistance;+2 safety/equipment Stand pivot transfers: Max assist;+2 physical assistance;+2 safety/equipment       General transfer comment: heavy two person assist but pt is able to assist with power up to stand and initiates stepping LEs, use of bedpad to assist with supporting hips  Ambulation/Gait                 Stairs             Wheelchair Mobility    Modified Rankin (Stroke Patients Only)       Balance Overall balance  assessment: Needs assistance Sitting-balance support: Single extremity supported;Bilateral upper extremity supported;Feet supported Sitting balance-Leahy Scale: Poor Sitting balance - Comments: working on reaching and maintaining midline, tends to fluctuate between lateral/anterior lean   Standing balance support: Bilateral upper extremity supported Standing balance-Leahy Scale: Zero                              Cognition Arousal/Alertness: Awake/alert Behavior During Therapy: Flat affect;WFL for tasks assessed/performed Overall Cognitive Status: Impaired/Different from baseline Area of Impairment: Attention;Following commands;Awareness;Problem solving                   Current Attention Level: Sustained Memory: Decreased recall of precautions;Decreased short-term memory Following Commands: Follows one step commands with increased time Safety/Judgement: Decreased awareness of safety;Decreased awareness of deficits Awareness: Emergent Problem Solving: Slow processing;Decreased initiation;Requires verbal cues;Requires tactile cues General Comments: remains slow to process but more interactive today compared to previous sessions      Exercises Other Exercises Other Exercises: warm up AAROM to Bil LE and UE's    General Comments General comments (skin integrity, edema, etc.): vss on 30 L at 80% FiO2      Pertinent Vitals/Pain Pain Assessment: Faces Faces Pain Scale: Hurts little more Pain Location: neck, LUE Pain Descriptors / Indicators: Guarding;Moaning;Discomfort;Grimacing Pain Intervention(s): Monitored during session    Home Living                      Prior Function  PT Goals (current goals can now be found in the care plan section) Acute Rehab PT Goals Patient Stated Goal: home PT Goal Formulation: With patient Time For Goal Achievement: 11/12/20 Potential to Achieve Goals: Fair Progress towards PT goals: Progressing toward  goals (limited by resp status)    Frequency    Min 5X/week      PT Plan Current plan remains appropriate    Co-evaluation PT/OT/SLP Co-Evaluation/Treatment: Yes Reason for Co-Treatment: Complexity of the patient's impairments (multi-system involvement) PT goals addressed during session: Mobility/safety with mobility OT goals addressed during session: ADL's and self-care      AM-PAC PT "6 Clicks" Mobility   Outcome Measure  Help needed turning from your back to your side while in a flat bed without using bedrails?: A Lot Help needed moving from lying on your back to sitting on the side of a flat bed without using bedrails?: A Lot Help needed moving to and from a bed to a chair (including a wheelchair)?: A Lot Help needed standing up from a chair using your arms (e.g., wheelchair or bedside chair)?: A Lot Help needed to walk in hospital room?: Total Help needed climbing 3-5 steps with a railing? : Total 6 Click Score: 10    End of Session         PT Visit Diagnosis: Other abnormalities of gait and mobility (R26.89);Pain Pain - Right/Left: Left Pain - part of body: Arm     Time: 5809-9833 PT Time Calculation (min) (ACUTE ONLY): 37 min  Charges:  $Therapeutic Activity: 8-22 mins                     11/07/2020  [name], PT Acute Rehabilitation Services [phone]  (pager) [phone]  (office)   Tessie Fass Freddie Nghiem 11/07/2020, 5:45 PM

## 2020-11-08 ENCOUNTER — Inpatient Hospital Stay: Payer: Self-pay

## 2020-11-08 ENCOUNTER — Inpatient Hospital Stay (HOSPITAL_COMMUNITY): Payer: Medicare Other

## 2020-11-08 DIAGNOSIS — R0603 Acute respiratory distress: Secondary | ICD-10-CM

## 2020-11-08 DIAGNOSIS — J9602 Acute respiratory failure with hypercapnia: Secondary | ICD-10-CM

## 2020-11-08 DIAGNOSIS — M5412 Radiculopathy, cervical region: Secondary | ICD-10-CM | POA: Diagnosis not present

## 2020-11-08 DIAGNOSIS — J9601 Acute respiratory failure with hypoxia: Secondary | ICD-10-CM | POA: Diagnosis not present

## 2020-11-08 DIAGNOSIS — J189 Pneumonia, unspecified organism: Secondary | ICD-10-CM | POA: Diagnosis not present

## 2020-11-08 DIAGNOSIS — L89000 Pressure ulcer of unspecified elbow, unstageable: Secondary | ICD-10-CM | POA: Diagnosis not present

## 2020-11-08 LAB — GLUCOSE, CAPILLARY
Glucose-Capillary: 124 mg/dL — ABNORMAL HIGH (ref 70–99)
Glucose-Capillary: 132 mg/dL — ABNORMAL HIGH (ref 70–99)
Glucose-Capillary: 136 mg/dL — ABNORMAL HIGH (ref 70–99)
Glucose-Capillary: 155 mg/dL — ABNORMAL HIGH (ref 70–99)
Glucose-Capillary: 164 mg/dL — ABNORMAL HIGH (ref 70–99)
Glucose-Capillary: 172 mg/dL — ABNORMAL HIGH (ref 70–99)

## 2020-11-08 LAB — BASIC METABOLIC PANEL
Anion gap: 10 (ref 5–15)
BUN: 37 mg/dL — ABNORMAL HIGH (ref 8–23)
CO2: 26 mmol/L (ref 22–32)
Calcium: 8.4 mg/dL — ABNORMAL LOW (ref 8.9–10.3)
Chloride: 110 mmol/L (ref 98–111)
Creatinine, Ser: 1 mg/dL (ref 0.61–1.24)
GFR, Estimated: >60 mL/min (ref >60)
Glucose, Bld: 125 mg/dL — ABNORMAL HIGH (ref 70–99)
Potassium: 4.1 mmol/L (ref 3.5–5.1)
Sodium: 146 mmol/L — ABNORMAL HIGH (ref 135–145)

## 2020-11-08 LAB — CBC
HCT: 28.2 % — ABNORMAL LOW (ref 39.0–52.0)
Hemoglobin: 8.6 g/dL — ABNORMAL LOW (ref 13.0–17.0)
MCH: 30.3 pg (ref 26.0–34.0)
MCHC: 30.5 g/dL (ref 30.0–36.0)
MCV: 99.3 fL (ref 80.0–100.0)
Platelets: 205 10*3/uL (ref 150–400)
RBC: 2.84 MIL/uL — ABNORMAL LOW (ref 4.22–5.81)
RDW: 14.6 % (ref 11.5–15.5)
WBC: 16.2 10*3/uL — ABNORMAL HIGH (ref 4.0–10.5)
nRBC: 0 % (ref 0.0–0.2)

## 2020-11-08 LAB — POCT I-STAT 7, (LYTES, BLD GAS, ICA,H+H)
Acid-Base Excess: 6 mmol/L — ABNORMAL HIGH (ref 0.0–2.0)
Bicarbonate: 32 mmol/L — ABNORMAL HIGH (ref 20.0–28.0)
Calcium, Ion: 1.23 mmol/L (ref 1.15–1.40)
HCT: 23 % — ABNORMAL LOW (ref 39.0–52.0)
Hemoglobin: 7.8 g/dL — ABNORMAL LOW (ref 13.0–17.0)
O2 Saturation: 100 %
Patient temperature: 99.3
Potassium: 4.2 mmol/L (ref 3.5–5.1)
Sodium: 148 mmol/L — ABNORMAL HIGH (ref 135–145)
TCO2: 34 mmol/L — ABNORMAL HIGH (ref 22–32)
pCO2 arterial: 56.5 mmHg — ABNORMAL HIGH (ref 32.0–48.0)
pH, Arterial: 7.363 (ref 7.350–7.450)
pO2, Arterial: 205 mmHg — ABNORMAL HIGH (ref 83.0–108.0)

## 2020-11-08 LAB — COMPREHENSIVE METABOLIC PANEL
ALT: 248 U/L — ABNORMAL HIGH (ref 0–44)
AST: 292 U/L — ABNORMAL HIGH (ref 15–41)
Albumin: 1.6 g/dL — ABNORMAL LOW (ref 3.5–5.0)
Alkaline Phosphatase: 214 U/L — ABNORMAL HIGH (ref 38–126)
Anion gap: 10 (ref 5–15)
BUN: 36 mg/dL — ABNORMAL HIGH (ref 8–23)
CO2: 27 mmol/L (ref 22–32)
Calcium: 8.4 mg/dL — ABNORMAL LOW (ref 8.9–10.3)
Chloride: 114 mmol/L — ABNORMAL HIGH (ref 98–111)
Creatinine, Ser: 0.95 mg/dL (ref 0.61–1.24)
GFR, Estimated: >60 mL/min (ref >60)
Glucose, Bld: 168 mg/dL — ABNORMAL HIGH (ref 70–99)
Potassium: 3.8 mmol/L (ref 3.5–5.1)
Sodium: 151 mmol/L — ABNORMAL HIGH (ref 135–145)
Total Bilirubin: 0.9 mg/dL (ref 0.3–1.2)
Total Protein: 5.6 g/dL — ABNORMAL LOW (ref 6.5–8.1)

## 2020-11-08 LAB — PHOSPHORUS: Phosphorus: 3.2 mg/dL (ref 2.5–4.6)

## 2020-11-08 LAB — MAGNESIUM: Magnesium: 2.6 mg/dL — ABNORMAL HIGH (ref 1.7–2.4)

## 2020-11-08 MED ORDER — ROCURONIUM BROMIDE 10 MG/ML (PF) SYRINGE
PREFILLED_SYRINGE | INTRAVENOUS | Status: AC
  Administered 2020-11-08: 11:00:00 100 mg
  Filled 2020-11-08: qty 10

## 2020-11-08 MED ORDER — ETOMIDATE 2 MG/ML IV SOLN
INTRAVENOUS | Status: AC
  Administered 2020-11-08: 11:00:00 20 mg
  Filled 2020-11-08: qty 20

## 2020-11-08 MED ORDER — PHENYLEPHRINE HCL-NACL 10-0.9 MG/250ML-% IV SOLN
INTRAVENOUS | Status: AC
  Filled 2020-11-08: qty 250

## 2020-11-08 MED ORDER — MIDAZOLAM HCL 2 MG/2ML IJ SOLN
INTRAMUSCULAR | Status: AC
  Filled 2020-11-08: qty 4

## 2020-11-08 MED ORDER — DEXMEDETOMIDINE HCL IN NACL 400 MCG/100ML IV SOLN
0.0000 ug/kg/h | INTRAVENOUS | Status: AC
  Administered 2020-11-08: 12:00:00 0.4 ug/kg/h via INTRAVENOUS
  Administered 2020-11-09: 22:00:00 0.2 ug/kg/h via INTRAVENOUS
  Administered 2020-11-10 (×2): 0.6 ug/kg/h via INTRAVENOUS
  Administered 2020-11-10: 16:00:00 1 ug/kg/h via INTRAVENOUS
  Filled 2020-11-08 (×9): qty 100

## 2020-11-08 MED ORDER — SODIUM CHLORIDE 0.9% FLUSH: 10.0000 mL | INTRAVENOUS | Status: DC | PRN

## 2020-11-08 MED ORDER — ORAL CARE MOUTH RINSE
15.0000 mL | OROMUCOSAL | Status: DC
  Administered 2020-11-08 – 2020-11-14 (×60): 15 mL via OROMUCOSAL

## 2020-11-08 MED ORDER — PHENYLEPHRINE HCL-NACL 10-0.9 MG/250ML-% IV SOLN
0.0000 ug/min | INTRAVENOUS | Status: DC
  Administered 2020-11-08: 19:00:00 20 ug/min via INTRAVENOUS
  Administered 2020-11-09: 20:00:00 2.5 ug/min via INTRAVENOUS
  Filled 2020-11-08 (×2): qty 250

## 2020-11-08 MED ORDER — FENTANYL CITRATE (PF) 100 MCG/2ML IJ SOLN
25.0000 ug | INTRAMUSCULAR | Status: DC | PRN
  Administered 2020-11-08 (×2): 50 ug via INTRAVENOUS
  Administered 2020-11-09 – 2020-11-10 (×3): 100 ug via INTRAVENOUS
  Filled 2020-11-08 (×5): qty 2

## 2020-11-08 MED ORDER — ALBUTEROL SULFATE (2.5 MG/3ML) 0.083% IN NEBU
2.5000 mg | INHALATION_SOLUTION | RESPIRATORY_TRACT | Status: DC
  Administered 2020-11-08 – 2020-11-10 (×12): 2.5 mg via RESPIRATORY_TRACT
  Filled 2020-11-08 (×12): qty 3

## 2020-11-08 MED ORDER — NOREPINEPHRINE 4 MG/250ML-% IV SOLN
2.0000 ug/min | INTRAVENOUS | Status: DC
  Administered 2020-11-08: 15:00:00 2 ug/min via INTRAVENOUS

## 2020-11-08 MED ORDER — SODIUM CHLORIDE 0.9% FLUSH
10.0000 mL | Freq: Two times a day (BID) | INTRAVENOUS | Status: DC
  Administered 2020-11-08 – 2020-11-09 (×2): 10 mL
  Administered 2020-11-09: 13:00:00 20 mL
  Administered 2020-11-10 – 2020-11-12 (×5): 10 mL
  Administered 2020-11-12: 22:00:00 30 mL
  Administered 2020-11-13: 22:00:00 20 mL
  Administered 2020-11-13: 11:00:00 10 mL
  Administered 2020-11-14: 22:00:00 20 mL
  Administered 2020-11-14 – 2020-11-18 (×8): 10 mL
  Administered 2020-11-19: 30 mL
  Administered 2020-11-19 – 2020-11-28 (×17): 10 mL

## 2020-11-08 MED ORDER — POTASSIUM CHLORIDE 20 MEQ PO PACK
40.0000 meq | PACK | Freq: Two times a day (BID) | ORAL | Status: AC
  Administered 2020-11-08 (×2): 40 meq
  Filled 2020-11-08 (×2): qty 2

## 2020-11-08 MED ORDER — FUROSEMIDE 10 MG/ML IJ SOLN
40.0000 mg | Freq: Once | INTRAMUSCULAR | Status: AC
  Administered 2020-11-08: 09:00:00 40 mg via INTRAVENOUS
  Filled 2020-11-08: qty 4

## 2020-11-08 MED ORDER — PROSOURCE TF PO LIQD
90.0000 mL | Freq: Two times a day (BID) | ORAL | Status: DC
  Administered 2020-11-08 – 2020-11-14 (×13): 90 mL
  Filled 2020-11-08 (×12): qty 90

## 2020-11-08 MED ORDER — SODIUM CHLORIDE 0.9 % IV SOLN
250.0000 mL | INTRAVENOUS | Status: DC
  Administered 2020-11-14 – 2020-11-15 (×2): 250 mL via INTRAVENOUS

## 2020-11-08 MED ORDER — FENTANYL CITRATE (PF) 100 MCG/2ML IJ SOLN: 25.0000 ug | INTRAMUSCULAR | Status: DC | PRN

## 2020-11-08 MED ORDER — CHLORHEXIDINE GLUCONATE 0.12% ORAL RINSE (MEDLINE KIT)
15.0000 mL | Freq: Two times a day (BID) | OROMUCOSAL | Status: DC
  Administered 2020-11-08 – 2020-11-14 (×13): 15 mL via OROMUCOSAL

## 2020-11-08 MED ORDER — NOREPINEPHRINE 4 MG/250ML-% IV SOLN
INTRAVENOUS | Status: AC
  Filled 2020-11-08: qty 250

## 2020-11-08 MED ORDER — FUROSEMIDE 10 MG/ML IJ SOLN
40.0000 mg | Freq: Once | INTRAMUSCULAR | Status: DC
  Filled 2020-11-08: qty 4

## 2020-11-08 MED ORDER — FENTANYL CITRATE (PF) 100 MCG/2ML IJ SOLN
INTRAMUSCULAR | Status: AC
  Filled 2020-11-08: qty 2

## 2020-11-08 MED ORDER — PIVOT 1.5 CAL PO LIQD
1000.0000 mL | ORAL | Status: DC
  Administered 2020-11-08 – 2020-11-14 (×7): 1000 mL
  Filled 2020-11-08 (×4): qty 1000

## 2020-11-08 MED ORDER — POLYETHYLENE GLYCOL 3350 17 G PO PACK
17.0000 g | PACK | Freq: Every day | ORAL | Status: DC
  Administered 2020-11-10 – 2020-11-19 (×6): 17 g
  Filled 2020-11-08 (×7): qty 1

## 2020-11-08 NOTE — Progress Notes (Signed)
RT called to bedside by RN due to patient sating mid 80's. NRB placed on patient. RT ,RN and NP bedside. RT NT suctioned patient and obtained small amounts of Febus thick secretions. Patient sats currently 92% on HHFNC 40L 100% and NRB. RT will continue to monitor.

## 2020-11-08 NOTE — Procedures (Signed)
Bedside Bronchoscopy Procedure Note [name] [name] [id number] 1946-10-19  Procedure: Bronchoscopy Indications: Diagnostic evaluation of the airways, Obtain specimens for culture and/or other diagnostic studies and Remove secretions  Procedure Details: ET Tube Size: 8.0 ET Tube secured at lip (cm): 25 Bite block in place: No In preparation for procedure, Patient hyper-oxygenated with 100 % FiO2 Airway entered and the following bronchi were examined: RUL, RML, RLL, LUL, LLL and Bronchi.   Bronchoscope removed.  Patient remained on 100% FiO2 at conclusion of procedure.    Evaluation BP 131/70   Pulse 71   Temp 99.2 F (37.3 C) (Axillary)   Resp (!) 26   Ht _0  (1.88 m)   Wt 119.4 kg   SpO2 100%   BMI 33.80 kg/m  Breath Sounds:Rhonch O2 sats: stable throughout Patient's Current Condition: stable Specimens:  Sent BAL Complications: No apparent complications Patient did tolerate procedure well.   Kathie Dike 11/08/2020, 11:39 AM

## 2020-11-08 NOTE — Progress Notes (Signed)
PT Cancellation Note  Patient Details Name: [name] [name] MRN: [id number] DOB: 12-Oct-1946   Cancelled Treatment:    Reason Eval/Treat Not Completed: Medical issues which prohibited therapy.  Sats declined, pt unable to clear secretions, back on the vent today.  Will hold and check 12/24. 11/08/2020  [name], PT Acute Rehabilitation Services [phone]  (pager) [phone]  (office)   Tessie Fass Kaytlynne Neace 11/08/2020, 12:46 PM

## 2020-11-08 NOTE — Progress Notes (Addendum)
Inpatient Rehabilitation Admissions Coordinator                                    Noted patient off BiPAP and now on HFNC 30 L. Not yet at a level to be considered for an intensive inpt rehab admit. I will continue to follow at a distance.  Danne Baxter, RN, MSN Rehab Admissions Coordinator [phone] 11/08/2020 8:16 AM

## 2020-11-08 NOTE — Progress Notes (Signed)
  Called for brief episode of SVT, in the 200's.  On precedex 0.4 and NE 2 mcg/min.   P:  Will change NE to Neo for less beta stimulation Check BMET, Mag 2.3 this am Restraints as needed- frequently reaching for ETT Continue PAD protocol with fentanyl/ precedex RASS goal 0/-1   \ Kennieth Rad, ACNP [facility] 11/08/2020, 6:29 PM

## 2020-11-08 NOTE — Progress Notes (Signed)
BP 139/60   Pulse 73   Temp 99.3 F (37.4 C) (Axillary)   Resp (!) 24   Ht 6\' 2"  (1.88 m)   Wt 119.4 kg   SpO2 98%   BMI 33.80 kg/m  Alert, intubated Lungs much clearer Intubated this morning after respiratory failure this am No neurological changes This is obviously a setback. Decisions about his future treatments will need to be made.

## 2020-11-08 NOTE — Procedures (Signed)
Intubation Procedure Note  MAZEN MARCIN  [id number]  1946-07-12  Date:11/08/20  Time:11:37 AM   Provider Performing:Deepak Bless    Procedure: Intubation (31500)  Indication(s) Respiratory Failure  Consent Risks of the procedure as well as the alternatives and risks of each were explained to the patient and/or caregiver.  Consent for the procedure was obtained and is signed in the bedside chart   Anesthesia Etomidate and Rocuronium   Time Out Verified patient identification, verified procedure, site/side was marked, verified correct patient position, special equipment/implants available, medications/allergies/relevant history reviewed, required imaging and test results available.   Sterile Technique Usual hand hygeine, masks, and gloves were used   Procedure Description Patient positioned in bed supine.  Sedation given as noted above.  Patient was intubated with endotracheal tube using Glidescope.  View was Grade 1 full glottis .  Number of attempts was 1.  Colorimetric CO2 detector was consistent with tracheal placement.   Complications/Tolerance None; patient tolerated the procedure well. Chest X-ray is ordered to verify placement.   EBL Minimal   Specimen(s) None

## 2020-11-08 NOTE — Procedures (Signed)
Bronchoscopy Procedure Note  [name] [name]  [id number]  01-04-1946  Date:11/08/20  Time:11:37 AM   Provider Performing:Jeanee Fabre   Procedure(s):  Flexible Bronchoscopy [phone]) and Subsequent Therapeutic Aspiration of Tracheobronchial Tree [phone])  Indication(s) Acute respiratory failure and proteus PNA  Consent Risks of the procedure as well as the alternatives and risks of each were explained to the patient and/or caregiver.  Consent for the procedure was obtained and is signed in the bedside chart  Anesthesia Etomidate, Rocuronium   Time Out Verified patient identification, verified procedure, site/side was marked, verified correct patient position, special equipment/implants available, medications/allergies/relevant history reviewed, required imaging and test results available.   Sterile Technique Usual hand hygiene, masks, gowns, and gloves were used   Procedure Description Bronchoscope advanced through endotracheal tube and into airway.  Airways were examined down to subsegmental level with findings noted below.   Following diagnostic evaluation, Therapeutic aspiration performed in B/l Lower lobes  Findings: Thick copious amount of greenish secretions noted   Complications/Tolerance None; patient tolerated the procedure well. Chest X-ray is needed post procedure.   EBL Minimal   Specimen(s) Sent for cx and sensitivities

## 2020-11-08 NOTE — Progress Notes (Signed)
HR elevated to 200s for about 30 beats, then back to NSR. Kennieth Rad NP notified and ordered magnesium level, switch levophed to phenylephrine, and restraint order due to pt having his hands on his ETT.  Will continue to monitor.

## 2020-11-08 NOTE — Progress Notes (Addendum)
NAME:  [name] [name], MRN:  [id number], DOB:  May 19, 1946, LOS: 78 ADMISSION DATE:  10/26/2020, CONSULTATION DATE:  10/28/20 REFERRING MD:  Sherley Bounds, MD CHIEF COMPLAINT:  Acute hypoxemic respiratory failure  Brief History   74 year old s/p posterior cervical laminectomy, hospital course complicated by respiratory failure, PNA, and mucous plugging.   History of present illness   [name] [name] is a 74 year old male with cervical stenosis. Admitted for ACDF on 12/10 and developed LUE weakness post-op requiring posterior cervical decompression on 12/11. PCCM called on 12/12 for post-op hypoxemia. Yesterday was on RA and needed supplemental O2 this morning which has now increased to NRB. Given lasix 20 mg. He is on NIV at home for OSA. Has not worn device since his first surgery.  Past Medical History  Cervical stenosis, chronic diastolic heart failure, OSA, COPD, neuropathy  Significant Hospital Events   12/10 ACDF C3/4 C5/6 12/11 Posterior cervical decompression C3-6 12/12 PCCM consulted for post-op hypoxemia 12/17-respiratory decompensation with hypoxemia, now on BiPAP 12/21 -Remains on BiPAP - FiO2 down to 50% with SpO2 98% Significantly more awake and interactive this morning, CXR - mucus plugging on Lt with volume loss  12/22 Febrile up to 101.8 overnight, Off BiPAP to [place], wound to bridge of nose noted after coming off BiPAP, strong cough, ongoing secretions, remains NPO, abx changed to cefepime  Consults:  NSG PCCM  Procedures:  12/13 cortrak > 12/18 foley >>  Significant Diagnostic Tests:   MRI cord signal change C3-4 C5-6 CXR 10/28/20 - Left atelectasis CXR 12/13> increasing L opacity -- effusion vs obstructive atelectasis  Chest x-ray 12/16-left lower lobe atelectasis, small effusion 12/21 CXR> mucus plugging on Lt with volume loss   Micro Data:  SARS Covid 12/11 - neg 12/19 trach asp >> proteus vulgaris (resistant to ampicillin/ cefazolin)  Antimicrobials:   12/12 Unasyn -12/17 12/19 Rocephin > 12/22 12/22 cefepime >>  Interim history/subjective:  Was able to get OOB yesterday Remains on HHFNC 80%, 30L tmax 99.2 overnight More delirious/ confused overnight, mostly oriented this morning  Objective   Blood pressure 120/62, pulse 64, temperature 99.2 F (37.3 C), temperature source Axillary, resp. rate 18, height _0  (1.88 m), weight 119.4 kg, SpO2 98 %.    FiO2 (%):  [80 %-100 %] 80 %   Intake/Output Summary (Last 24 hours) at 11/08/2020 6378 Last data filed at 11/08/2020 5885 Gross per 24 hour  Intake 4101.47 ml  Output 1775 ml  Net 2326.47 ml   Filed Weights   11/02/20 0500 11/06/20 0600 11/08/20 0442  Weight: 117.5 kg 118.8 kg 119.4 kg    Physical Exam: General:  Chronically ill appearing older gentleman lying in bed in NAD HEENT: MM pink/dry, flaky tongue, pupils 3/reactive, anicteric, anterior neck incision site wnl, cortrak Neuro: alert, oriented to person, place, month, f/c, MAE, continue slight weakness LUE CV: NSR, no murmur PULM:  Non labored on HHFNC, diffuse rhonchi and wheeze, strong cough GI: obese, soft, bs +, foley remains due to maceration around penis Extremities: warm/dry, generalized edema +1-2, more so in upper extremities Skin: no rashes   No family at bedside.   CXR 12/23- cardiomegaly and vascular congestion; persistent left perihilar opacity Labs reviewed  Na 156-151, K 3.5-3.8, CL 115-> 118-> 114 sCr 1.19-> 0.95/ UOP 1426m/ 24hr WBC 20.7-> 16.8-> 15.3-> 16.2 Hgb 9.2-> 8.8-> 8.6 +3.5/ net +10L  Resolved Hospital Problem list     Assessment & Plan:   Multifactorial encephalopathy Delirium precautions, ?  Sundowning Minimize sedation Continue to push PT efforts Neuro checks   Acute on chronic Hypoxic/hypercarbic respiratory failure Proteus vulgaris PNA with mucous plugging -initial suspicion for aspiration, tx with unasyn 12/12-12/17 -worsening resp status, infiltrate on CXR,  persistent WBC - Tmax 102.2, thick secretions -- started rocephin 12/19; cx 12/21 proteus vulgaris, resistant to ampicillin, ongoing fever  Presumed Hx COPD  Continue to wean HHFNC, BiPAP prn  Goal SPO2 >88-94% Still tenuous respiratory status, continue to monitor in ICU today  Remains NPO, failed SLP, continue cortrak Aspiration precuations Continue aggressive pulm hygiene- OOB, CPT, PT efforts, mucinex Continue prn albuterol, brovanna/ pulmicort/ yupelri nebs  Diuresis as below Intermittent CXR- slight vascular congestion on todays CXR  Continue cefepime per pharmacy (changed from CTX to cefepime 12/22)   Addendum: patient became progressively lethargic, hypoxic- despite increased HHFNC and NRB, ongoing secretions unable to clear.  BiPAP not an option- would continue to aspirate.  Discussed with Dr. Tacy Learn, and decision was made to intubate f/b bronch and BAL.   Family updated on events.    Diastolic heart failure Lasix 40 mg today with KCL x 2 doses  Strict I/Os/ daily weights  S/p ACDF/ laminectomy C3-C6 12/11 LUE allodynia, improving Per NSGY  Continue gabapentin, PRN OxyIR, valium for muscle spasms  Hypertension Hyperlipidemia Continue metoprolol, cozzar, lipitor   Diabetes CBG remain within goal 140-180 Continue SSI resistant with TF coverage, metformin   Hypernatremia Hypokalemia, mild Continue FWF 400 ml q 4hr D/c D5W KCL with lasix as above Trend renal indices  Foley as below   Inadequate PO intake SLP following, remains NPO EN per cortrak, tolerating at goal  Bowel regimen (last BM 12/22)  Maceration around penis Continue foley for now, d/c when wound better   Nasal bridge pressure injury (BiPAP) Per [facility] recommendations   Best practice (evaluated daily)   Diet: Enteral nutrition Pain/Anxiety/Delirium protocol (if indicated): pRN oxy. TID gaba VAP protocol (if indicated): pulm hygiene  DVT prophylaxis: SQH  GI prophylaxis: PPI Glucose control: CBG  q4h Mobility: BR last date of multidisciplinary goals of care discussion N/A Family and staff present N/A Summary of discussion N/A Follow up goals of care discussion due -- per primary 12/22-  HCPOA is [name] [name].  He is patient's neices husband/ son in law to sister [name]. I updated him by phone and answered all questions.  [name] [name] brought up Orestes and stated patient would not want life-prolonging measures if he were to decompensate to that point.  At this time though, I feel patient will be able to recovery, if no major complications, but it will just be a prolonged recovery.   12/23- pending family update  Code Status: Full code Disposition: continue ICU given tenuous airway and secretions  Labs   CBC: Recent Labs  Lab 11/03/20 0221 11/04/20 0232 11/06/20 0225 11/07/20 0337 11/08/20 0225  WBC  --  20.7* 16.8* 15.3* 16.2*  NEUTROABS  --  17.5*  --   --   --   HGB 9.9* 9.2* 9.2* 8.8* 8.6*  HCT 29.0* 30.0* 30.5* 27.4* 28.2*  MCV  --  98.4 100.3* 96.5 99.3  PLT  --  192 194 216 419    Basic Metabolic Panel: Recent Labs  Lab 11/01/20 1047 11/02/20 1529 11/02/20 1726 11/04/20 0232 11/05/20 0141 11/06/20 0225 11/07/20 0337 11/08/20 0225  NA 145  --    < > 155* 156* 155* 156* 151*  K 3.2* 4.3   < > 3.5 3.4* 3.4* 3.5 3.8  CL  104  --    < > 112* 113* 115* 118* 114*  CO2 26  --    < > _0 GLUCOSE 181*  --    < > 202* 146* 208* 113* 168*  BUN 39*  --    < > 52* 48* 44* 40* 36*  CREATININE 1.07  --    < > 1.31* 1.23 1.15 1.19 0.95  CALCIUM 8.2*  --    < > 8.5* 8.2* 8.5* 8.6* 8.4*  MG  --   --   --   --   --   --   --  2.6*  PHOS 2.5 4.0  --   --   --   --   --  3.2   < > = values in this interval not displayed.   GFR: Estimated Creatinine Clearance: 90.9 mL/min (by C-G formula based on SCr of 0.95 mg/dL). Recent Labs  Lab 11/04/20 0232 11/06/20 0225 11/07/20 0337 11/08/20 0225  WBC 20.7* 16.8* 15.3* 16.2*    Liver Function Tests: Recent Labs  Lab  11/08/20 0225  AST 292*  ALT 248*  ALKPHOS 214*  BILITOT 0.9  PROT 5.6*  ALBUMIN 1.6*   No results for input(s): LIPASE, AMYLASE in the last 168 hours. No results for input(s): AMMONIA in the last 168 hours.  ABG    Component Value Date/Time   PHART 7.442 11/03/2020 0221   PCO2ART 50.0 (H) 11/03/2020 0221   PO2ART 61 (L) 11/03/2020 0221   HCO3 34.0 (H) 11/03/2020 0221   TCO2 35 (H) 11/03/2020 0221   O2SAT 91.0 11/03/2020 0221     Coagulation Profile: No results for input(s): INR, PROTIME in the last 168 hours.  Cardiac Enzymes: No results for input(s): CKTOTAL, CKMB, CKMBINDEX, TROPONINI in the last 168 hours.  HbA1C: Hgb A1c MFr Bld  Date/Time Value Ref Range Status  10/26/2020 06:35 PM 6.9 (H) 4.8 - 5.6 % Final    Comment:    (NOTE) Pre diabetes:          5.7%-6.4%  Diabetes:              >6.4%  Glycemic control for   <7.0% adults with diabetes     CBG: Recent Labs  Lab 11/07/20 1507 11/07/20 1946 11/07/20 2337 11/08/20 0325 11/08/20 0758  GLUCAP 159* 154* 165* 172* 164*   CCT 30 mins   Kennieth Rad, ACNP [facility] 11/08/2020, 8:39 AM

## 2020-11-08 NOTE — Progress Notes (Signed)
   11/08/20 1330  Clinical Encounter Type  Visited With Patient;Health care provider  Visit Type Spiritual support;Critical Care  Referral From Nurse  Consult/Referral To Chaplain  Spiritual Encounters  Spiritual Needs Prayer;Emotional  Stress Factors  Patient Stress Factors Major life changes  Family Stress Factors None identified  Chaplain visited patient earlier in the morning but patient was being intubated. Chaplain returned later and caregiver was bedside. He is staying until patient's daughter gets here tomorrow. He said that patient had requested Chaplain to pray for him.Chaplain offered prayer for patient and emotional support for caregiver. Chaplain is available when needed.

## 2020-11-08 NOTE — Progress Notes (Signed)
SLP Cancellation Note  Patient Details Name: [name] [name] MRN: [id number] DOB: 10-15-1946   Cancelled treatment:        [name] [name] required intubation this morning. Will follow up with status next week.    Houston Siren 11/08/2020, 11:51 AM   Orbie Pyo Colvin Caroli.Ed Risk analyst [phone] Office [phone]

## 2020-11-08 NOTE — Progress Notes (Signed)
Peripherally Inserted Central Catheter Placement  The IV Nurse has discussed with the patient and/or persons authorized to consent for the patient, the purpose of this procedure and the potential benefits and risks involved with this procedure.  The benefits include less needle sticks, lab draws from the catheter, and the patient may be discharged home with the catheter. Risks include, but not limited to, infection, bleeding, blood clot (thrombus formation), and puncture of an artery; nerve damage and irregular heartbeat and possibility to perform a PICC exchange if needed/ordered by physician.  Alternatives to this procedure were also discussed.  Bard Power PICC patient education guide, fact sheet on infection prevention and patient information card has been provided to patient /or left at bedside.    PICC Placement Documentation  PICC Triple Lumen 40/08/67 PICC Right Basilic 45 cm 0 cm (Active)  Indication for Insertion or Continuance of Line Prolonged intravenous therapies 11/08/20 1547  Exposed Catheter (cm) 0 cm 11/08/20 1547  Site Assessment Clean;Dry;Intact 11/08/20 1547  Lumen #1 Status Flushed;Blood return noted;Saline locked 11/08/20 1547  Lumen #2 Status Flushed;Blood return noted;Saline locked 11/08/20 1547  Lumen #3 Status Flushed;Blood return noted;Saline locked 11/08/20 1547  Dressing Type Transparent 11/08/20 1547  Dressing Status Clean;Dry;Intact 11/08/20 1547  Antimicrobial disc in place? Yes 11/08/20 1547  Dressing Change Due 11/15/20 11/08/20 1547       Frances Maywood 11/08/2020, 3:50 PM

## 2020-11-08 NOTE — Progress Notes (Signed)
Nutrition Follow-up  DOCUMENTATION CODES:   Not applicable  INTERVENTION:   Tube feeding via Cortrak tube: Pivot 1.5 at 60 ml/h (1440 ml per day) 90 ml ProSource TF BID  Provides 2320 kcal, 179 gm protein, 1092 ml free water daily 400 ml free water every 4 hours  Total free water: 3492 ml   MVI with minerals daily    NUTRITION DIAGNOSIS:   Inadequate oral intake related to inability to eat as evidenced by NPO status. Ongoing.   GOAL:   Patient will meet greater than or equal to 90% of their needs Met with TF.   MONITOR:   Diet advancement,TF tolerance  REASON FOR ASSESSMENT:   Consult Enteral/tube feeding initiation and management  ASSESSMENT:   Pt with PMH of cervial stenosis, CHF, OSA, and COPD admitted for ACDF on 12/10.   Pt discussed during ICU rounds and with RN.  Pt developed respiratory distress requiring intubation this am.    12/10 ACDF 12/11 s/p decompressive laminectomies  12/12 pt with hypoxia due to left pleural effusion and atelectasis  12/13 cortrak placed  12/17 requiring BiPAP support  12/23 intubated  Patient is currently intubated on ventilator support MV: 14.6 L/min Temp (24hrs), Avg:99 F (37.2 C), Min:98.6 F (37 C), Max:99.3 F (37.4 C)   Medications reviewed and include: vitamin D3, colace, lasix, SSI, 2 units novolog every 4 hours, MVI, miralax, KCl 40 mEq BID Precedex  Labs reviewed: Na 151, AST: 292, ALT: 248 CBG's: 164-124   Current TF via Cortrak:  Pivot 1.5 at 65 ml/h  Provides 2340 kcal, 146 gm protein  Diet Order:   Diet Order    None      EDUCATION NEEDS:   No education needs have been identified at this time  Skin:  Skin Assessment:  (MARSI - penis) Skin Integrity Issues:: DTI DTI: nose Stage II: perineum  Last BM:  12/22  Height:   Ht Readings from Last 1 Encounters:  11/01/20 $RemoveB'6\' 2"'SPvtanfp$  (1.88 m)    Weight:   Wt Readings from Last 1 Encounters:  11/08/20 119.4 kg    Ideal Body Weight:   86.3 kg  BMI:  Body mass index is 33.8 kg/m.  Estimated Nutritional Needs:   Kcal:  2300-2500  Protein:  145-170 grams  Fluid:  2 L/day  Lockie Pares., RD, LDN, CNSC See AMiON for contact information

## 2020-11-09 ENCOUNTER — Inpatient Hospital Stay (HOSPITAL_COMMUNITY): Payer: Medicare Other

## 2020-11-09 DIAGNOSIS — J189 Pneumonia, unspecified organism: Secondary | ICD-10-CM | POA: Diagnosis not present

## 2020-11-09 DIAGNOSIS — Y95 Nosocomial condition: Secondary | ICD-10-CM | POA: Diagnosis not present

## 2020-11-09 DIAGNOSIS — J9601 Acute respiratory failure with hypoxia: Secondary | ICD-10-CM | POA: Diagnosis not present

## 2020-11-09 LAB — COMPREHENSIVE METABOLIC PANEL
ALT: 200 U/L — ABNORMAL HIGH (ref 0–44)
AST: 189 U/L — ABNORMAL HIGH (ref 15–41)
Albumin: 1.5 g/dL — ABNORMAL LOW (ref 3.5–5.0)
Alkaline Phosphatase: 210 U/L — ABNORMAL HIGH (ref 38–126)
Anion gap: 9 (ref 5–15)
BUN: 40 mg/dL — ABNORMAL HIGH (ref 8–23)
CO2: 24 mmol/L (ref 22–32)
Calcium: 8.4 mg/dL — ABNORMAL LOW (ref 8.9–10.3)
Chloride: 112 mmol/L — ABNORMAL HIGH (ref 98–111)
Creatinine, Ser: 1.03 mg/dL (ref 0.61–1.24)
GFR, Estimated: >60 mL/min (ref >60)
Glucose, Bld: 167 mg/dL — ABNORMAL HIGH (ref 70–99)
Potassium: 6 mmol/L — ABNORMAL HIGH (ref 3.5–5.1)
Sodium: 145 mmol/L (ref 135–145)
Total Bilirubin: 1 mg/dL (ref 0.3–1.2)
Total Protein: 5.6 g/dL — ABNORMAL LOW (ref 6.5–8.1)

## 2020-11-09 LAB — CBC
HCT: 28.7 % — ABNORMAL LOW (ref 39.0–52.0)
Hemoglobin: 9.4 g/dL — ABNORMAL LOW (ref 13.0–17.0)
MCH: 31.5 pg (ref 26.0–34.0)
MCHC: 32.8 g/dL (ref 30.0–36.0)
MCV: 96.3 fL (ref 80.0–100.0)
Platelets: 205 10*3/uL (ref 150–400)
RBC: 2.98 MIL/uL — ABNORMAL LOW (ref 4.22–5.81)
RDW: 14.6 % (ref 11.5–15.5)
WBC: 16.8 10*3/uL — ABNORMAL HIGH (ref 4.0–10.5)
nRBC: 0.1 % (ref 0.0–0.2)

## 2020-11-09 LAB — MAGNESIUM: Magnesium: 2.4 mg/dL (ref 1.7–2.4)

## 2020-11-09 LAB — GLUCOSE, CAPILLARY
Glucose-Capillary: 164 mg/dL — ABNORMAL HIGH (ref 70–99)
Glucose-Capillary: 176 mg/dL — ABNORMAL HIGH (ref 70–99)
Glucose-Capillary: 160 mg/dL — ABNORMAL HIGH (ref 70–99)
Glucose-Capillary: 176 mg/dL — ABNORMAL HIGH (ref 70–99)
Glucose-Capillary: 169 mg/dL — ABNORMAL HIGH (ref 70–99)

## 2020-11-09 LAB — BASIC METABOLIC PANEL
Anion gap: 10 (ref 5–15)
BUN: 38 mg/dL — ABNORMAL HIGH (ref 8–23)
CO2: 24 mmol/L (ref 22–32)
Calcium: 8.5 mg/dL — ABNORMAL LOW (ref 8.9–10.3)
Chloride: 110 mmol/L (ref 98–111)
Creatinine, Ser: 0.91 mg/dL (ref 0.61–1.24)
GFR, Estimated: >60 mL/min (ref >60)
Glucose, Bld: 166 mg/dL — ABNORMAL HIGH (ref 70–99)
Potassium: 3.9 mmol/L (ref 3.5–5.1)
Sodium: 144 mmol/L (ref 135–145)

## 2020-11-09 LAB — PHOSPHORUS: Phosphorus: 3.4 mg/dL (ref 2.5–4.6)

## 2020-11-09 MED ORDER — FUROSEMIDE 10 MG/ML IJ SOLN
40.0000 mg | Freq: Every day | INTRAMUSCULAR | Status: DC
  Administered 2020-11-09 – 2020-11-10 (×2): 40 mg via INTRAVENOUS
  Filled 2020-11-09 (×2): qty 4

## 2020-11-09 MED ORDER — SODIUM CHLORIDE 3 % IN NEBU
4.0000 mL | INHALATION_SOLUTION | Freq: Every day | RESPIRATORY_TRACT | Status: AC
  Administered 2020-11-11: 08:00:00 4 mL via RESPIRATORY_TRACT

## 2020-11-09 MED ORDER — CHLORHEXIDINE GLUCONATE CLOTH 2 % EX PADS
6.0000 | MEDICATED_PAD | Freq: Every day | CUTANEOUS | Status: DC
  Administered 2020-11-10: 22:00:00 6 via TOPICAL

## 2020-11-09 NOTE — Progress Notes (Signed)
Subjective: The patient is alert and attentive.  He is in no apparent distress.  He follows commands.  Objective: Vital signs in last 24 hours: Temp:  [99.3 F (37.4 C)-100.8 F (38.2 C)] 100 F (37.8 C) (12/24 0800) Pulse Rate:  [39-93] 64 (12/24 0715) Resp:  [18-33] 21 (12/24 0715) BP: (80-166)/(44-90) 134/58 (12/24 0715) SpO2:  [90 %-100 %] 100 % (12/24 0800) FiO2 (%):  [40 %-100 %] 40 % (12/24 0757) Estimated body mass index is 33.8 kg/m as calculated from the following:   Height as of this encounter: 6\' 2"  (1.88 m).   Weight as of this encounter: 119.4 kg.   Intake/Output from previous day: 12/23 0701 - 12/24 0700 In: 1167.7 [I.V.:486.4; NG/GT:381.3; IV Piggyback:300.1] Out: 2375 [Urine:2375] Intake/Output this shift: No intake/output data recorded.  Physical exam the patient is Glasgow Coma Scale 11 intubated.  He follows commands and moves all 4 extremities well.  I do not note any obvious weakness.  Lab Results: Recent Labs    11/08/20 0225 11/08/20 1303 11/09/20 0202  WBC 16.2*  --  16.8*  HGB 8.6* 7.8* 9.4*  HCT 28.2* 23.0* 28.7*  PLT 205  --  205   BMET Recent Labs    11/08/20 1827 11/09/20 0202  NA 146* 145  K 4.1 6.0*  CL 110 112*  CO2 26 24  GLUCOSE 125* 167*  BUN 37* 40*  CREATININE 1.00 1.03  CALCIUM 8.4* 8.4*    Studies/Results: DG Chest Port 1 View  Result Date: 11/08/2020 CLINICAL DATA:  Hypoxia EXAM: PORTABLE CHEST 1 VIEW COMPARISON:  November 08, 2020 study obtained earlier in the day FINDINGS: Endotracheal tube tip is 5.9 cm above the carina. Enteric tube tip is below the diaphragm. No pneumothorax. There is a left pleural effusion with consolidation in a portion of the left lower lobe. There is perihilar interstitial edema bilaterally, slightly more on the left than on the right. No airspace consolidation on the right. Heart is mildly enlarged with a degree of pulmonary venous hypertension. No adenopathy evident. IMPRESSION: Tube  positions as described without pneumothorax. New left pleural effusion. There is perihilar interstitial edema with cardiomegaly and pulmonary vascular congestion. Suspect a degree of congestive heart failure. Consolidation left lower lobe could represent alveolar edema but is concerning for potential superimposed pneumonia. Electronically Signed   By: Lowella Grip III M.D.   On: 11/08/2020 11:58   DG Chest Port 1 View  Result Date: 11/08/2020 CLINICAL DATA:  Healthcare associated pneumonia EXAM: PORTABLE CHEST 1 VIEW COMPARISON:  11/07/2020 FINDINGS: Feeding tube with tip at least in the stomach. Asymmetric hazy opacity about the left hilum. There may also be vascular congestion in this patient with cardiomegaly and vascular pedicle widening. IMPRESSION: 1. Cardiomegaly and vascular congestion. 2. Persistent left perihilar opacity which may reflect residual atelectasis or infection. Electronically Signed   By: Monte Fantasia M.D.   On: 11/08/2020 06:33   [place] EKG SITE RITE  Result Date: 11/08/2020 If Site Rite image not attached, placement could not be confirmed due to current cardiac rhythm.   Assessment/Plan: Status post anterior and posterior cervical decompression: The patient seems to be progressing well neurologically.  Respiratory failure: I appreciate critical care medicine's management of this.  LOS: 14 days     Ophelia Charter 11/09/2020, 8:48 AM

## 2020-11-09 NOTE — Progress Notes (Signed)
PT Cancellation Note  Patient Details Name: [name] [name] MRN: [id number] DOB: 08-08-1946   Cancelled Treatment:    Reason Eval/Treat Not Completed: Medical issues which prohibited therapy.  Respiratory status decline.   11/09/2020  [name], PT Acute Rehabilitation Services [phone]  (pager) [phone]  (office)   [name] 11/09/2020, 1:03 PM

## 2020-11-09 NOTE — Progress Notes (Signed)
Inpatient Rehabilitation Admissions Coordinator   Noted intubated. We will sign off at this time. Please re consult if becomes appropriate.  Danne Baxter, RN, MSN Rehab Admissions Coordinator [phone] 11/09/2020 11:17 AM

## 2020-11-09 NOTE — Progress Notes (Signed)
NAME:  [name] [name], MRN:  [id number], DOB:  01/27/1946, LOS: 25 ADMISSION DATE:  10/26/2020, CONSULTATION DATE:  10/28/20 REFERRING MD:  Sherley Bounds, MD CHIEF COMPLAINT:  Acute hypoxemic respiratory failure  Brief History   74 year old s/p posterior cervical laminectomy, hospital course complicated by respiratory failure, PNA, and mucous plugging.   History of present illness   [name] is a 74 year old male with cervical stenosis. Admitted for ACDF on 12/10 and developed LUE weakness post-op requiring posterior cervical decompression on 12/11. PCCM called on 12/12 for post-op hypoxemia. Yesterday was on RA and needed supplemental O2 this morning which has now increased to NRB. Given lasix 20 mg. He is on NIV at home for OSA. Has not worn device since his first surgery.  Past Medical History  Cervical stenosis, chronic diastolic heart failure, OSA, COPD, neuropathy  Significant Hospital Events   12/10 ACDF C3/4 C5/6 12/11 Posterior cervical decompression C3-6 12/12 PCCM consulted for post-op hypoxemia 12/17-respiratory decompensation with hypoxemia, now on BiPAP 12/21 -Remains on BiPAP - FiO2 down to 50% with SpO2 98% Significantly more awake and interactive this morning, CXR - mucus plugging on Lt with volume loss  12/22 Febrile up to 101.8 overnight, Off BiPAP to [place], wound to bridge of nose noted after coming off BiPAP, strong cough, ongoing secretions, remains NPO, abx changed to cefepime 12/23 intubated worsening hypoxia delirium  Consults:  NSG PCCM  Procedures:  12/13 cortrak > 12/18 foley >> 12/23 ETT >>  Significant Diagnostic Tests:   MRI cord signal change C3-4 C5-6 CXR 10/28/20 - Left atelectasis CXR 12/13> increasing L opacity -- effusion vs obstructive atelectasis  Chest x-ray 12/16-left lower lobe atelectasis, small effusion 12/21 CXR> mucus plugging on Lt with volume loss   Micro Data:  SARS Covid 12/11 - neg 12/19 trach asp >> proteus vulgaris  (resistant to ampicillin/ cefazolin)  Antimicrobials:  12/12 Unasyn -12/17 12/19 Rocephin > 12/22 12/22 cefepime >>  Interim history/subjective:   Intubated yesterday Critically ill Hypotensive on Neo-Synephrine drip Sedated on Precedex Episode of SVT around 6 PM, resolved spontaneously   Objective   Blood pressure (!) 134/58, pulse 64, temperature 100 F (37.8 C), temperature source Axillary, resp. rate (!) 21, height _0  (1.88 m), weight 119.4 kg, SpO2 100 %.    Vent Mode: PRVC FiO2 (%):  [40 %-100 %] 40 % Set Rate:  [26 bmp] 26 bmp Vt Set:  [570 mL] 570 mL PEEP:  [8 cmH20] 8 cmH20 Plateau Pressure:  [20 cmH20-26 cmH20] 20 cmH20   Intake/Output Summary (Last 24 hours) at 11/09/2020 0938 Last data filed at 11/09/2020 0600 Gross per 24 hour  Intake 938.26 ml  Output 2050 ml  Net -1111.74 ml   Filed Weights   11/02/20 0500 11/06/20 0600 11/08/20 0442  Weight: 117.5 kg 118.8 kg 119.4 kg    Physical Exam: Gen:      elderly obese man, no distress  HEENT:  EOMI, sclera anicteric, mild pallor, oral ET tube Neck:     No JVD; no thyromegaly , incision left anterior neck Lungs:    Decreased breath sounds left, no accessory muscle use CV:         Regular rate and rhythm; no murmurs Abd:      + bowel sounds; soft, non-tender; no palpable masses, no distension Ext:    No edema; adequate peripheral perfusion Skin:      Warm and dry; no rash Neuro: Awake, RASS 0 on Precedex,  decreased movement right foot to command moves all other extremities  Labs show hyperkalemia elevated LFTs , mild stable leukocytosis, stable anemia Chest x-ray 12/23 shows improved aeration at the left base with intubation  Resolved Hospital Problem list     Assessment & Plan:    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 Sundowning  -Using Precedex and as needed fentanyl with goal RASS 0 to -1   Chronic diastolic heart failure Lasix 40 mg IV daily Strict I/Os/ daily weights  S/p ACDF/ laminectomy C3-C6 12/11 LUE allodynia, improving Per NSGY  Continue gabapentin, PRN OxyIR, valium for muscle spasms  Hypertension Hyperlipidemia Holding metoprolol and losartan while on pressors  Diabetes CBG remain within goal 140-180 Continue SSI resistant with TF coverage  Hypernatremia Hypokalemia, mild >> replaced now hyperkalemic Recheck be met   Maceration around penis Continue foley for now, d/c when wound better   Nasal bridge pressure injury (BiPAP) Per [facility] recommendations   Best practice (evaluated daily)   Diet: Enteral nutrition Pain/Anxiety/Delirium protocol (if indicated): pRN oxy. fent prn, precedex RASS 0 VAP protocol (if indicated): pulm hygiene  DVT prophylaxis: SQH  GI prophylaxis: PPI Glucose control: CBG q4h Mobility: BR last date of multidisciplinary goals of care discussion   12/22-  HCPOA is [name] [name].  He is patient's neices husband/ son in law to sister [name] [name]. I updated him by phone and answered all questions.  [name] [name] brought up [place] and stated patient would not want life-prolonging measures if he were to decompensate to that point.  At this time though, I feel patient will be able to recovery, if no major complications, but it will just be a prolonged recovery.    Code Status: Full code Disposition:  ICU   Labs   CBC: Recent Labs  Lab 11/04/20 0232 11/06/20 0225 11/07/20 0337 11/08/20 0225 11/08/20 1303 11/09/20 0202  WBC 20.7* 16.8* 15.3* 16.2*  --   16.8*  NEUTROABS 17.5*  --   --   --   --   --   HGB 9.2* 9.2* 8.8* 8.6* 7.8* 9.4*  HCT 30.0* 30.5* 27.4* 28.2* 23.0* 28.7*  MCV 98.4 100.3* 96.5 99.3  --  96.3  PLT 192 194 216 205  --  277    Basic Metabolic Panel: Recent Labs  Lab 11/02/20 1529 11/02/20 1726 11/06/20 0225 11/07/20 0337 11/08/20 0225 11/08/20 1303 11/08/20 1827 11/09/20 0202  NA  --    < > 155* 156* 151* 148* 146* 145  K 4.3   < > 3.4* 3.5 3.8 4.2 4.1 6.0*  CL  --    < > 115* 118* 114*  --  110 112*  CO2  --    < > _0 --  26 24  GLUCOSE  --    < > 208* 113* 168*  --  125* 167*  BUN  --    < > 44* 40* 36*  --  37* 40*  CREATININE  --    < > 1.15 1.19 0.95  --  1.00 1.03  CALCIUM  --    < > 8.5*  8.6* 8.4*  --  8.4* 8.4*  MG  --   --   --   --  2.6*  --   --  2.4  PHOS 4.0  --   --   --  3.2  --   --  3.4   < > = values in this interval not displayed.   GFR: Estimated Creatinine Clearance: 83.8 mL/min (by C-G formula based on SCr of 1.03 mg/dL). Recent Labs  Lab 11/06/20 0225 11/07/20 0337 11/08/20 0225 11/09/20 0202  WBC 16.8* 15.3* 16.2* 16.8*    Liver Function Tests: Recent Labs  Lab 11/08/20 0225 11/09/20 0202  AST 292* 189*  ALT 248* 200*  ALKPHOS 214* 210*  BILITOT 0.9 1.0  PROT 5.6* 5.6*  ALBUMIN 1.6* 1.5*   No results for input(s): LIPASE, AMYLASE in the last 168 hours. No results for input(s): AMMONIA in the last 168 hours.  ABG    Component Value Date/Time   PHART 7.363 11/08/2020 1303   PCO2ART 56.5 (H) 11/08/2020 1303   PO2ART 205 (H) 11/08/2020 1303   HCO3 32.0 (H) 11/08/2020 1303   TCO2 34 (H) 11/08/2020 1303   O2SAT 100.0 11/08/2020 1303     Coagulation Profile: No results for input(s): INR, PROTIME in the last 168 hours.  Cardiac Enzymes: No results for input(s): CKTOTAL, CKMB, CKMBINDEX, TROPONINI in the last 168 hours.  HbA1C: Hgb A1c MFr Bld  Date/Time Value Ref Range Status  10/26/2020 06:35 PM 6.9 (H) 4.8 - 5.6 % Final    Comment:    (NOTE) Pre  diabetes:          5.7%-6.4%  Diabetes:              >6.4%  Glycemic control for   <7.0% adults with diabetes     CBG: Recent Labs  Lab 11/08/20 1625 11/08/20 1941 11/08/20 2330 11/09/20 0305 11/09/20 0832  GLUCAP 132* 136* 155* 164* 176*     The patient is critically ill with multiple organ systems failure and requires high complexity decision making for assessment and support, frequent evaluation and titration of therapies, application of advanced monitoring technologies and extensive interpretation of multiple databases. Critical Care Time devoted to patient care services described in this note independent of APP/resident  time is 35 minutes.    Kara Mead MD. [facility]. [facility] See Amion for pager  If no response to pager , please call [phone]  After 7:00 pm call Elink  [phone]    11/09/2020, 9:38 AM

## 2020-11-10 ENCOUNTER — Inpatient Hospital Stay (HOSPITAL_COMMUNITY): Payer: Medicare Other

## 2020-11-10 DIAGNOSIS — G9340 Encephalopathy, unspecified: Secondary | ICD-10-CM

## 2020-11-10 DIAGNOSIS — J189 Pneumonia, unspecified organism: Secondary | ICD-10-CM | POA: Diagnosis not present

## 2020-11-10 DIAGNOSIS — J9602 Acute respiratory failure with hypercapnia: Secondary | ICD-10-CM | POA: Diagnosis not present

## 2020-11-10 DIAGNOSIS — Y95 Nosocomial condition: Secondary | ICD-10-CM | POA: Diagnosis not present

## 2020-11-10 LAB — CULTURE, BAL-QUANTITATIVE W GRAM STAIN
Culture: 40000 — AB
Special Requests: NORMAL

## 2020-11-10 LAB — PHOSPHORUS: Phosphorus: 2.8 mg/dL (ref 2.5–4.6)

## 2020-11-10 LAB — BASIC METABOLIC PANEL
Anion gap: 10 (ref 5–15)
BUN: 39 mg/dL — ABNORMAL HIGH (ref 8–23)
CO2: 26 mmol/L (ref 22–32)
Calcium: 8.5 mg/dL — ABNORMAL LOW (ref 8.9–10.3)
Chloride: 108 mmol/L (ref 98–111)
Creatinine, Ser: 0.76 mg/dL (ref 0.61–1.24)
GFR, Estimated: >60 mL/min (ref >60)
Glucose, Bld: 209 mg/dL — ABNORMAL HIGH (ref 70–99)
Potassium: 3.9 mmol/L (ref 3.5–5.1)
Sodium: 144 mmol/L (ref 135–145)

## 2020-11-10 LAB — CBC
HCT: 27.3 % — ABNORMAL LOW (ref 39.0–52.0)
Hemoglobin: 8.3 g/dL — ABNORMAL LOW (ref 13.0–17.0)
MCH: 30 pg (ref 26.0–34.0)
MCHC: 30.4 g/dL (ref 30.0–36.0)
MCV: 98.6 fL (ref 80.0–100.0)
Platelets: 224 10*3/uL (ref 150–400)
RBC: 2.77 MIL/uL — ABNORMAL LOW (ref 4.22–5.81)
RDW: 14.3 % (ref 11.5–15.5)
WBC: 15.3 10*3/uL — ABNORMAL HIGH (ref 4.0–10.5)
nRBC: 0 % (ref 0.0–0.2)

## 2020-11-10 LAB — GLUCOSE, CAPILLARY
Glucose-Capillary: 157 mg/dL — ABNORMAL HIGH (ref 70–99)
Glucose-Capillary: 176 mg/dL — ABNORMAL HIGH (ref 70–99)
Glucose-Capillary: 177 mg/dL — ABNORMAL HIGH (ref 70–99)
Glucose-Capillary: 178 mg/dL — ABNORMAL HIGH (ref 70–99)
Glucose-Capillary: 192 mg/dL — ABNORMAL HIGH (ref 70–99)
Glucose-Capillary: 195 mg/dL — ABNORMAL HIGH (ref 70–99)
Glucose-Capillary: 291 mg/dL — ABNORMAL HIGH (ref 70–99)

## 2020-11-10 LAB — MAGNESIUM: Magnesium: 2.4 mg/dL (ref 1.7–2.4)

## 2020-11-10 MED ORDER — FENTANYL CITRATE (PF) 100 MCG/2ML IJ SOLN
25.0000 ug | Freq: Once | INTRAMUSCULAR | Status: AC
  Administered 2020-11-10: 16:00:00 25 ug via INTRAVENOUS

## 2020-11-10 MED ORDER — FENTANYL BOLUS VIA INFUSION
25.0000 ug | INTRAVENOUS | Status: DC | PRN
Start: 2020-11-10 — End: 2020-11-11
  Filled 2020-11-10: qty 25

## 2020-11-10 MED ORDER — FENTANYL 2500MCG IN NS 250ML (10MCG/ML) PREMIX INFUSION
25.0000 ug/h | INTRAVENOUS | Status: DC
  Administered 2020-11-10: 16:00:00 25 ug/h via INTRAVENOUS
  Filled 2020-11-10: qty 250

## 2020-11-10 MED ORDER — METHYLPREDNISOLONE SODIUM SUCC 40 MG IJ SOLR
40.0000 mg | Freq: Two times a day (BID) | INTRAMUSCULAR | Status: DC
  Administered 2020-11-10 – 2020-11-13 (×7): 40 mg via INTRAVENOUS
  Filled 2020-11-10 (×7): qty 1

## 2020-11-10 MED ORDER — INSULIN ASPART 100 UNIT/ML ~~LOC~~ SOLN
6.0000 [IU] | SUBCUTANEOUS | Status: DC
  Administered 2020-11-10 – 2020-11-11 (×6): 6 [IU] via SUBCUTANEOUS

## 2020-11-10 NOTE — Progress Notes (Signed)
BP (!) 107/54   Pulse 81   Temp (!) 96.9 F (36.1 C) (Axillary)   Resp 19   Ht 6\' 2"  (1.88 m)   Wt 119.4 kg   SpO2 93%   BMI 33.80 kg/m  Alert and oriented Following commands Intubated, respiratory failure Has not progressed significantly from ventilator All possible modalities in place, CCM continues to work hard

## 2020-11-10 NOTE — Progress Notes (Signed)
NAME:  [name] [name], MRN:  [id number], DOB:  04/19/1946, LOS: 28 ADMISSION DATE:  10/26/2020, CONSULTATION DATE:  10/28/20 REFERRING MD:  Sherley Bounds, MD CHIEF COMPLAINT:  Acute hypoxemic respiratory failure  Brief History   74 year old s/p posterior cervical laminectomy, hospital course complicated by respiratory failure, PNA, and mucous plugging.   History of present illness   [name] is a 74 year old male with cervical stenosis. Admitted for ACDF on 12/10 and developed LUE weakness post-op requiring posterior cervical decompression on 12/11. PCCM called on 12/12 for post-op hypoxemia. Yesterday was on RA and needed supplemental O2 this morning which has now increased to NRB. Given lasix 20 mg. He is on NIV at home for OSA. Has not worn device since his first surgery.  Past Medical History  Cervical stenosis, chronic diastolic heart failure, OSA, COPD, neuropathy  Significant Hospital Events   12/10 ACDF C3/4 C5/6 12/11 Posterior cervical decompression C3-6 12/12 PCCM consulted for post-op hypoxemia 12/17-respiratory decompensation with hypoxemia, now on BiPAP 12/21 -Remains on BiPAP - FiO2 down to 50% with SpO2 98% Significantly more awake and interactive this morning, CXR - mucus plugging on Lt with volume loss  -initial suspicion for aspiration, tx with unasyn 12/12-12/17 -worsening resp status, infiltrate on CXR, persistent WBC - Tmax 102.2, thick secretions -- started rocephin 12/19  12/22 Febrile up to 101.8 overnight, Off BiPAP to [place], wound to bridge of nose noted after coming off BiPAP, strong cough, ongoing secretions, remains NPO, abx changed to cefepime 12/23 intubated worsening hypoxia delirium  Consults:  NSG PCCM  Procedures:  12/13 cortrak > 12/18 foley >> 12/23 ETT >>  Significant Diagnostic Tests:   MRI cord signal change C3-4 C5-6 CXR 10/28/20 - Left atelectasis CXR 12/13> increasing L opacity -- effusion vs obstructive atelectasis  Chest x-ray  12/16-left lower lobe atelectasis, small effusion 12/21 CXR> mucus plugging on Lt with volume loss   Micro Data:  SARS Covid 12/11 - neg 12/19 trach asp >> proteus vulgaris (resistant to ampicillin/ cefazolin)  Antimicrobials:  12/12 Unasyn -12/17 12/19 Rocephin > 12/22 12/22 cefepime >>  Interim history/subjective:  Failed weaning attempt this morning , Does get anxious and tachypneic Objective   Blood pressure (Abnormal) 111/55, pulse (Abnormal) 51, temperature 97.7 F (36.5 C), temperature source Axillary, resp. rate (Abnormal) 25, height 6\' 2"  (1.88 m), weight 119.4 kg, SpO2 98 %.    Vent Mode: PRVC FiO2 (%):  [40 %] 40 % Set Rate:  [24 bmp-26 bmp] 24 bmp Vt Set:  [570 mL] 570 mL PEEP:  [5 cmH20-8 cmH20] 5 cmH20 Plateau Pressure:  [15 cmH20-20 cmH20] 15 cmH20   Intake/Output Summary (Last 24 hours) at 11/10/2020 0732 Last data filed at 11/10/2020 0600 Gross per 24 hour  Intake 7783.66 ml  Output 3425 ml  Net 4358.66 ml   Filed Weights   11/02/20 0500 11/06/20 0600 11/08/20 0442  Weight: 117.5 kg 118.8 kg 119.4 kg    Physical Exam:  General this is a 74 year old [name] he is currently resting on full ventilator support.  Initially on arrival he was tachypneic and slightly anxious appears more comfortable now after titration up of his PEEP HEENT orally intubated no JVD anterior surgical site incision is unremarkable Pulmonary: Coarse bronchial sounding rhonchorous breath sounds, with prolonged expiratory wheeze present throughout currently on full ventilator support, saturations in the mid 90s to 100% after titration of PEEP, currently PEEP 8/FiO2 40% Cardiac: Regular rate and rhythm without murmur rub  or gallop Abdomen: Soft not tender no organomegaly Extremities: Warm and dry with brisk capillary refill strong bilaterally Neuro: Awake oriented no focal deficits appreciated.  He is somewhat anxious. GU: Clear yellow. 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 Sundowning Plan Cont precedex PRN fent RASS goal 0 to -1  Chronic diastolic heart failure, history of hypertension she has been Plan Continue IV Lasix Continue telemetry monitoring He is normally on verapamil, losartan, isosorbide and diuretics We will hold all antihypertensives for now, with the exception of Lasix given we just got him off from earlier this morning  S/p ACDF/ laminectomy C3-C6 12/11 LUE allodynia, improving Plan Cont gabapentin, PRN oxy and valium for muscle spasms  Additional per N surg    Hyperlipidemia Plan Lipitor   Diabetes w/ hyperglycemia Plan Cont ssi resistant scale Inc tube feed coverage Goal 140-180   Intermittent fluid and electrolyte imbalance Plan Repeat in am  Replace as indicated    Maceration around penis Plan Cont foley for now Dc once wound better   Nasal bridge pressure injury (BiPAP) Plan Cont per [facility] recs  Best practice (evaluated daily)   Diet: Enteral nutrition Pain/Anxiety/Delirium protocol (if indicated): pRN oxy. fent prn, precedex RASS 0 VAP protocol (if indicated): pulm hygiene  DVT prophylaxis: SQH  GI prophylaxis: PPI Glucose control: CBG q4h Mobility: BR last date of multidisciplinary goals of care discussion   12/22-  HCPOA is [name].  He is  patient's neices husband/ son in law to sister [name]. I updated him by phone and answered all questions.  [name] brought up [place] and stated patient would not want life-prolonging measures if he were to decompensate to that point.  At this time though, I feel patient will be able to recovery, if no major complications, but it will just be a prolonged recovery.    Code Status: Full code Disposition:  ICU   My critical care time is 35 minutes  Erick Colace ACNP-BC [place] Pager # [phone] OR # [phone] if no answer  11/10/2020, 7:32 AM

## 2020-11-10 NOTE — Progress Notes (Signed)
SBT attempted this AM. Pt placed on CPAP/PS 8/5 40%. Pt lasted about 20 minutes before requiring full support. Pt with increased RR into the mid/upper 40's. Pt suctioned for copious secretions. RT will continue to monitor.

## 2020-11-11 DIAGNOSIS — T17500A Unspecified foreign body in bronchus causing asphyxiation, initial encounter: Secondary | ICD-10-CM

## 2020-11-11 DIAGNOSIS — J9602 Acute respiratory failure with hypercapnia: Secondary | ICD-10-CM | POA: Diagnosis not present

## 2020-11-11 DIAGNOSIS — Y95 Nosocomial condition: Secondary | ICD-10-CM | POA: Diagnosis not present

## 2020-11-11 DIAGNOSIS — J189 Pneumonia, unspecified organism: Secondary | ICD-10-CM | POA: Diagnosis not present

## 2020-11-11 LAB — COMPREHENSIVE METABOLIC PANEL
ALT: 152 U/L — ABNORMAL HIGH (ref 0–44)
AST: 95 U/L — ABNORMAL HIGH (ref 15–41)
Albumin: 1.6 g/dL — ABNORMAL LOW (ref 3.5–5.0)
Alkaline Phosphatase: 207 U/L — ABNORMAL HIGH (ref 38–126)
Anion gap: 8 (ref 5–15)
BUN: 37 mg/dL — ABNORMAL HIGH (ref 8–23)
CO2: 25 mmol/L (ref 22–32)
Calcium: 8.8 mg/dL — ABNORMAL LOW (ref 8.9–10.3)
Chloride: 107 mmol/L (ref 98–111)
Creatinine, Ser: 0.68 mg/dL (ref 0.61–1.24)
GFR, Estimated: >60 mL/min (ref >60)
Glucose, Bld: 211 mg/dL — ABNORMAL HIGH (ref 70–99)
Potassium: 4.8 mmol/L (ref 3.5–5.1)
Sodium: 140 mmol/L (ref 135–145)
Total Bilirubin: 0.8 mg/dL (ref 0.3–1.2)
Total Protein: 5.9 g/dL — ABNORMAL LOW (ref 6.5–8.1)

## 2020-11-11 LAB — CBC
HCT: 28.7 % — ABNORMAL LOW (ref 39.0–52.0)
Hemoglobin: 8.8 g/dL — ABNORMAL LOW (ref 13.0–17.0)
MCH: 30 pg (ref 26.0–34.0)
MCHC: 30.7 g/dL (ref 30.0–36.0)
MCV: 98 fL (ref 80.0–100.0)
Platelets: 256 10*3/uL (ref 150–400)
RBC: 2.93 MIL/uL — ABNORMAL LOW (ref 4.22–5.81)
RDW: 13.8 % (ref 11.5–15.5)
WBC: 14.7 10*3/uL — ABNORMAL HIGH (ref 4.0–10.5)
nRBC: 0 % (ref 0.0–0.2)

## 2020-11-11 LAB — GLUCOSE, CAPILLARY
Glucose-Capillary: 119 mg/dL — ABNORMAL HIGH (ref 70–99)
Glucose-Capillary: 152 mg/dL — ABNORMAL HIGH (ref 70–99)
Glucose-Capillary: 161 mg/dL — ABNORMAL HIGH (ref 70–99)
Glucose-Capillary: 190 mg/dL — ABNORMAL HIGH (ref 70–99)
Glucose-Capillary: 196 mg/dL — ABNORMAL HIGH (ref 70–99)
Glucose-Capillary: 208 mg/dL — ABNORMAL HIGH (ref 70–99)

## 2020-11-11 MED ORDER — FENTANYL CITRATE (PF) 100 MCG/2ML IJ SOLN: 50.0000 ug | INTRAMUSCULAR | Status: DC | PRN

## 2020-11-11 MED ORDER — CHLORHEXIDINE GLUCONATE 0.12 % MT SOLN
OROMUCOSAL | Status: AC
  Filled 2020-11-11: qty 15

## 2020-11-11 MED ORDER — INSULIN ASPART 100 UNIT/ML ~~LOC~~ SOLN
8.0000 [IU] | SUBCUTANEOUS | Status: DC
Start: 2020-11-11 — End: 2020-11-12
  Administered 2020-11-11 – 2020-11-12 (×6): 8 [IU] via SUBCUTANEOUS

## 2020-11-11 MED ORDER — FENTANYL CITRATE (PF) 100 MCG/2ML IJ SOLN
50.0000 ug | INTRAMUSCULAR | Status: DC | PRN
  Administered 2020-11-11: 50 ug via INTRAVENOUS

## 2020-11-11 MED ORDER — FUROSEMIDE 10 MG/ML IJ SOLN
40.0000 mg | Freq: Once | INTRAMUSCULAR | Status: AC
  Administered 2020-11-11: 12:00:00 40 mg via INTRAVENOUS
  Filled 2020-11-11: qty 4

## 2020-11-11 MED ORDER — QUETIAPINE FUMARATE 50 MG PO TABS
50.0000 mg | ORAL_TABLET | Freq: Two times a day (BID) | ORAL | Status: DC
  Administered 2020-11-11 – 2020-11-21 (×20): 50 mg
  Filled 2020-11-11: qty 1
  Filled 2020-11-11: qty 2
  Filled 2020-11-11: qty 1
  Filled 2020-11-11 (×2): qty 2
  Filled 2020-11-11: qty 1
  Filled 2020-11-11: qty 2
  Filled 2020-11-11: qty 1
  Filled 2020-11-11 (×4): qty 2
  Filled 2020-11-11: qty 1
  Filled 2020-11-11: qty 2
  Filled 2020-11-11: qty 1
  Filled 2020-11-11: qty 2
  Filled 2020-11-11: qty 1
  Filled 2020-11-11 (×3): qty 2

## 2020-11-11 MED ORDER — FUROSEMIDE 10 MG/ML IJ SOLN
40.0000 mg | Freq: Two times a day (BID) | INTRAMUSCULAR | Status: DC
  Administered 2020-11-11 – 2020-11-12 (×2): 40 mg via INTRAVENOUS
  Filled 2020-11-11 (×2): qty 4

## 2020-11-11 MED ORDER — SODIUM CHLORIDE 3 % IN NEBU
4.0000 mL | INHALATION_SOLUTION | Freq: Every day | RESPIRATORY_TRACT | Status: AC
  Administered 2020-11-13 – 2020-11-14 (×2): 4 mL via RESPIRATORY_TRACT
  Filled 2020-11-11 (×3): qty 4

## 2020-11-11 MED ORDER — DEXMEDETOMIDINE HCL IN NACL 400 MCG/100ML IV SOLN
0.0000 ug/kg/h | INTRAVENOUS | Status: DC
  Administered 2020-11-11: 20:00:00 0.6 ug/kg/h via INTRAVENOUS
  Administered 2020-11-12: 16:00:00 0.4 ug/kg/h via INTRAVENOUS
  Filled 2020-11-11: qty 200

## 2020-11-11 NOTE — Progress Notes (Addendum)
NAME:  [name] [name], MRN:  [id number], DOB:  Jul 24, 1946, LOS: 63 ADMISSION DATE:  10/26/2020, CONSULTATION DATE:  10/28/20 REFERRING MD:  [name] Bounds, MD CHIEF COMPLAINT:  Acute hypoxemic respiratory failure  Brief History   74 year old s/p posterior cervical laminectomy, hospital course complicated by respiratory failure, PNA, and mucous plugging.   History of present illness   [name] is a 74 year old male with cervical stenosis. Admitted for ACDF on 12/10 and developed LUE weakness post-op requiring posterior cervical decompression on 12/11. PCCM called on 12/12 for post-op hypoxemia. Yesterday was on RA and needed supplemental O2 this morning which has now increased to NRB. Given lasix 20 mg. He is on NIV at home for OSA. Has not worn device since his first surgery.  Past Medical History  Cervical stenosis, chronic diastolic heart failure, OSA, COPD, neuropathy  Significant Hospital Events   12/10 ACDF C3/4 C5/6 12/11 Posterior cervical decompression C3-6 12/12 PCCM consulted for post-op hypoxemia 12/17-respiratory decompensation with hypoxemia, now on BiPAP 12/21 -Remains on BiPAP - FiO2 down to 50% with SpO2 98% Significantly more awake and interactive this morning, CXR - mucus plugging on Lt with volume loss  -initial suspicion for aspiration, tx with unasyn 12/12-12/17 -worsening resp status, infiltrate on CXR, persistent WBC - Tmax 102.2, thick secretions -- started rocephin 12/19  12/22 Febrile up to 101.8 overnight, Off BiPAP to [place], wound to bridge of nose noted after coming off BiPAP, strong cough, ongoing secretions, remains NPO, abx changed to cefepime 12/23 intubated worsening hypoxia delirium 12/25, added systemic steroids for worsening bronchospasm, added IV fentanyl drip, gave IV Lasix 12/26: Ventilator mechanics improved, physical exam improved.  Discontinuing fentanyl.,  Increased Lasix dosing, decrease PEEP to 5 from 8, hoping to reassess for  weaning Consults:  NSG PCCM  Procedures:  12/13 cortrak > 12/18 foley >> 12/23 ETT >>  Significant Diagnostic Tests:   MRI cord signal change C3-4 C5-6 CXR 10/28/20 - Left atelectasis CXR 12/13> increasing L opacity -- effusion vs obstructive atelectasis  Chest x-ray 12/16-left lower lobe atelectasis, small effusion 12/21 CXR> mucus plugging on Lt with volume loss   Micro Data:  SARS Covid 12/11 - neg 12/19 trach asp >> proteus vulgaris (resistant to ampicillin/ cefazolin)  Antimicrobials:  12/12 Unasyn -12/17 12/19 Rocephin > 12/22 12/22 cefepime >>  Interim history/subjective:  Failed weaning attempt this morning , Does get anxious and tachypneic Objective   Blood pressure (Abnormal) 162/76, pulse (Abnormal) 58, temperature 97.8 F (36.6 C), temperature source Axillary, resp. rate (Abnormal) 24, height 6\' 2"  (1.88 m), weight 119.4 kg, SpO2 100 %.    Vent Mode: PRVC FiO2 (%):  [40 %] 40 % Set Rate:  [24 bmp] 24 bmp Vt Set:  [570 mL] 570 mL PEEP:  [5 cmH20-8 cmH20] 8 cmH20 Pressure Support:  [8 cmH20] 8 cmH20 Plateau Pressure:  [15 cmH20-30 cmH20] 22 cmH20   Intake/Output Summary (Last 24 hours) at 11/11/2020 [id number] Last data filed at 11/11/2020 0700 Gross per 24 hour  Intake 3314.93 ml  Output 3800 ml  Net -485.07 ml   Filed Weights   11/02/20 0500 11/06/20 0600 11/08/20 0442  Weight: 117.5 kg 118.8 kg 119.4 kg    Physical Exam:  General 74 year old male patient currently sedated on Precedex and fentanyl infusions he is in no acute distress and tolerating mechanical ventilation without issue HEENT normocephalic dressing on bridge of nose intact no jugular venous distention appreciated anterior cervical incision clean dry and intact  Pulmonary: Clear to auscultation, currently wheezing has improved, still has prolonged expiratory phase but remarkably improved exam compared with 12/25, peak pressures 41, plateau pressures 22 Cardiac: Regular rate and rhythm, no  murmur rub or gallop Abdomen: Soft nontender no organomegaly, tolerating tube feeds. GU clear yellow Neuro opens eyes, will follow commands, sedated. Extremities warm dry brisk capillary refill dependent edema  Resolved Hospital Problem list   Hypernatremia Hypokalemia, mild >> replaced now hyperkalemic  Assessment & Plan:    Acute on chronic Hypoxic/hypercarbic respiratory failure Proteus vulgaris HAP with mucous plugging, complicated by acute exacerbation of COPD Solu-Medrol added 12/25 for bronchospasm exam improved on 12/26 Portable chest x-ray from 12/25 patchy bilateral airspace disease Plan Continue full ventilator support, will reduce PEEP to 5 today  Change RASS goal to 0-1  Discontinue fentanyl drip, continue Precedex  IV Lasix  Continue IV Solu-Medrol  Continue scheduled bronchodilators, we discussed continued yuperli Day #5 cefepime, will complete 7 days  A.m. chest x-ray  VAP bundle    Acute multifactorial encephalopathy Delirium precautions, ? Sundowning, this was likely also exacerbated by pneumonia and hypoxia Plan Discontinuing fentanyl drip, changing back to intermittent RASS goal 0 to -1 Continue Precedex, with concern about previous delirium we will also add low-dose Seroquel  Chronic diastolic heart failure, history of hypertension she has been Plan Continue IV Lasix, will escalate to every 12 Continue telemetry monitoring Holding verapamil, losartan, isosorbide  Continue telemetry monitoring   S/p ACDF/ laminectomy C3-C6 12/11 LUE allodynia, improving Plan Continuing gabapentin, as needed Valium  Fentanyl as needed    Hyperlipidemia Plan Lipitor  Diabetes w/ hyperglycemia Not quite at glycemic goal Plan Continue resistant sliding scale  Goal glucose 140-180  Increasing tube feed coverage to 8 units  Intermittent fluid and electrolyte imbalance Plan Repeat a.m. chemistry  Maceration around penis Plan We will keep Foley in place  for now, we can reassess discontinuing this once improved   Nasal bridge pressure injury (BiPAP) Plan Continue dressing  Best practice (evaluated daily)   Diet: Enteral nutrition Pain/Anxiety/Delirium protocol (if indicated): pRN oxy. fent prn, precedex RASS 0 VAP protocol (if indicated): pulm hygiene  DVT prophylaxis: SQH  GI prophylaxis: PPI Glucose control: CBG q4h Mobility: BR last date of multidisciplinary goals of care discussion   12/22-  HCPOA is [name].  He is patient's neices husband/ son in law to sister [name]. I updated him by phone and answered all questions.  [name] brought up [place] and stated patient would not want life-prolonging measures if he were to decompensate to that point.  At this time though, I feel patient will be able to recovery, if no major complications, but it will just be a prolonged recovery.    Code Status: Full code Disposition:  ICU   My critical care time is 33 minutes  [name] [name] ACNP-BC [place] Pager # [phone] OR # [phone] if no answer  11/11/2020, 8:26 AM

## 2020-11-11 NOTE — Progress Notes (Signed)
Subjective: NAEs o/n  Objective: Vital signs in last 24 hours: Temp:  [97.8 F (36.6 C)-99.2 F (37.3 C)] 97.8 F (36.6 C) (12/26 0809) Pulse Rate:  [49-84] 49 (12/26 1100) Resp:  [19-29] 29 (12/26 1100) BP: (100-165)/(47-78) 100/58 (12/26 1100) SpO2:  [93 %-100 %] 99 % (12/26 1100) FiO2 (%):  [40 %] 40 % (12/26 1051)  Intake/Output from previous day: 12/25 0701 - 12/26 0700 In: 3380.9 [I.V.:472.8; NG/GT:2640; IV Piggyback:268.1] Out: 3800 [Urine:3800] Intake/Output this shift: Total I/O In: 597.4 [I.V.:45.3; NG/GT:520; IV Piggyback:32] Out: -   FC x 4 Breathing on CPAP with regular rate  Lab Results: Recent Labs    11/10/20 0541 11/11/20 0436  WBC 15.3* 14.7*  HGB 8.3* 8.8*  HCT 27.3* 28.7*  PLT 224 256   BMET Recent Labs    11/10/20 0541 11/11/20 0436  NA 144 140  K 3.9 4.8  CL 108 107  CO2 26 25  GLUCOSE 209* 211*  BUN 39* 37*  CREATININE 0.76 0.68  CALCIUM 8.5* 8.8*    Studies/Results: DG Chest Port 1 View  Result Date: 11/10/2020 CLINICAL DATA:  Acute respiratory distress EXAM: PORTABLE CHEST 1 VIEW COMPARISON:  Radiograph 11/10/2020 FINDINGS: Endotracheal tube and feeding tube are unchanged. PICC line unchanged. Stable cardiac silhouette. There is bilateral patchy airspace disease. No pneumothorax. Anterior cervical fusion IMPRESSION: 1. Stable support apparatus. 2. Patchy bilateral airspace disease suggesting multifocal pneumonia. 3. No interval change. Electronically Signed   By: Suzy Bouchard M.D.   On: 11/10/2020 10:56   DG Chest Port 1 View  Result Date: 11/10/2020 CLINICAL DATA:  Acute respiratory failure. EXAM: PORTABLE CHEST 1 VIEW COMPARISON:  12.421 FINDINGS: 0517 hours. Endotracheal tube tip is approximately 7.2 cm above the base of the carina. Feeding tube tip is in the mid stomach. Right PICC line tip overlies the mid SVC level. The cardio pericardial silhouette is enlarged. Retrocardiac collapse/consolidation is similar to prior.  Telemetry leads overlie the chest. IMPRESSION: 1. Stable exam. Retrocardiac collapse/consolidation. 2. Support apparatus as described. Electronically Signed   By: Misty Stanley M.D.   On: 11/10/2020 07:01    Assessment/Plan: - possible extubation trial today  Vallarie Mare 11/11/2020, 11:38 AM

## 2020-11-12 ENCOUNTER — Inpatient Hospital Stay (HOSPITAL_COMMUNITY): Payer: Medicare Other

## 2020-11-12 DIAGNOSIS — J156 Pneumonia due to other aerobic Gram-negative bacteria: Secondary | ICD-10-CM | POA: Diagnosis not present

## 2020-11-12 DIAGNOSIS — Z9911 Dependence on respirator [ventilator] status: Secondary | ICD-10-CM

## 2020-11-12 DIAGNOSIS — J9601 Acute respiratory failure with hypoxia: Secondary | ICD-10-CM | POA: Diagnosis not present

## 2020-11-12 LAB — COMPREHENSIVE METABOLIC PANEL
ALT: 162 U/L — ABNORMAL HIGH (ref 0–44)
AST: 109 U/L — ABNORMAL HIGH (ref 15–41)
Albumin: 1.7 g/dL — ABNORMAL LOW (ref 3.5–5.0)
Alkaline Phosphatase: 205 U/L — ABNORMAL HIGH (ref 38–126)
Anion gap: 7 (ref 5–15)
BUN: 47 mg/dL — ABNORMAL HIGH (ref 8–23)
CO2: 27 mmol/L (ref 22–32)
Calcium: 9 mg/dL (ref 8.9–10.3)
Chloride: 104 mmol/L (ref 98–111)
Creatinine, Ser: 0.8 mg/dL (ref 0.61–1.24)
GFR, Estimated: >60 mL/min (ref >60)
Glucose, Bld: 204 mg/dL — ABNORMAL HIGH (ref 70–99)
Potassium: 4.5 mmol/L (ref 3.5–5.1)
Sodium: 138 mmol/L (ref 135–145)
Total Bilirubin: 0.4 mg/dL (ref 0.3–1.2)
Total Protein: 5.9 g/dL — ABNORMAL LOW (ref 6.5–8.1)

## 2020-11-12 LAB — GLUCOSE, CAPILLARY
Glucose-Capillary: 203 mg/dL — ABNORMAL HIGH (ref 70–99)
Glucose-Capillary: 183 mg/dL — ABNORMAL HIGH (ref 70–99)
Glucose-Capillary: 143 mg/dL — ABNORMAL HIGH (ref 70–99)
Glucose-Capillary: 245 mg/dL — ABNORMAL HIGH (ref 70–99)
Glucose-Capillary: 161 mg/dL — ABNORMAL HIGH (ref 70–99)
Glucose-Capillary: 176 mg/dL — ABNORMAL HIGH (ref 70–99)

## 2020-11-12 LAB — CBC
HCT: 28.3 % — ABNORMAL LOW (ref 39.0–52.0)
Hemoglobin: 9 g/dL — ABNORMAL LOW (ref 13.0–17.0)
MCH: 30.7 pg (ref 26.0–34.0)
MCHC: 31.8 g/dL (ref 30.0–36.0)
MCV: 96.6 fL (ref 80.0–100.0)
Platelets: 306 10*3/uL (ref 150–400)
RBC: 2.93 MIL/uL — ABNORMAL LOW (ref 4.22–5.81)
RDW: 13.7 % (ref 11.5–15.5)
WBC: 15.3 10*3/uL — ABNORMAL HIGH (ref 4.0–10.5)
nRBC: 0 % (ref 0.0–0.2)

## 2020-11-12 MED ORDER — FREE WATER
300.0000 mL | Status: DC
  Administered 2020-11-12 – 2020-11-13 (×7): 300 mL

## 2020-11-12 MED ORDER — INSULIN ASPART 100 UNIT/ML ~~LOC~~ SOLN
4.0000 [IU] | SUBCUTANEOUS | Status: DC
  Administered 2020-11-12 – 2020-11-13 (×6): 4 [IU] via SUBCUTANEOUS

## 2020-11-12 MED ORDER — FUROSEMIDE 10 MG/ML IJ SOLN
40.0000 mg | Freq: Three times a day (TID) | INTRAMUSCULAR | Status: AC
  Administered 2020-11-12 – 2020-11-17 (×17): 40 mg via INTRAVENOUS
  Filled 2020-11-12 (×17): qty 4

## 2020-11-12 MED ORDER — INSULIN DETEMIR 100 UNIT/ML ~~LOC~~ SOLN
25.0000 [IU] | Freq: Two times a day (BID) | SUBCUTANEOUS | Status: DC
  Administered 2020-11-12 – 2020-11-13 (×3): 25 [IU] via SUBCUTANEOUS
  Filled 2020-11-12 (×4): qty 0.25

## 2020-11-12 NOTE — Progress Notes (Signed)
SLP Cancellation Note  Patient Details Name: [name] [name] MRN: [id number] DOB: Oct 12, 1946   Cancelled treatment:        Pt continues to be intubated. Will follow   Houston Siren 11/12/2020, 8:22 AM   Orbie Pyo Colvin Caroli.Ed Risk analyst [phone] Office [phone]

## 2020-11-12 NOTE — Progress Notes (Signed)
Physical Therapy Treatment Patient Details Name: [name] [name] MRN: [id number] DOB: [birth year] Today's Date: 11/12/2020    History of Present Illness Patient is a 74 y/o male who presents s/p C3-4, 4-5 ACDF 12/10 with post op LUE weakness requiring C3-6 posterior decompressive lami 12/11 complicated by post op hypoxemia. Reintubated on 12/23. PMH includes COPD, prostata CA, HTN, depression, CHF.    PT Comments    The pt was seen by PT/OT to improve safety of transfers and to allow for improved mobility at this time. The pt was agreeable to session, and demos good movement with RLE and UE to assist with bed mobility and seated balance. Despite efforts, the pt continues to require maxA of 2 to complete movements safely, as well as increased cues to attend to and move LUE. The pt was able to express some needs through writing this session, but needed frequent reminders to leave ETT and NGT alone. The pt will continue to benefit from skilled PT to progress functional strength and core stability to allow for improved seated stability and reduced need for assist with bed mobility, improved activity tolerance, and strength in order to attempt further OOB transfers.    Follow Up Recommendations  CIR;Supervision for mobility/OOB;Supervision/Assistance - 24 hour     Equipment Recommendations  None recommended by PT    Recommendations for Other Services       Precautions / Restrictions Precautions Precautions: Fall;Cervical Precaution Comments: intubated Restrictions Weight Bearing Restrictions: No    Mobility  Bed Mobility Overal bed mobility: Needs Assistance Bed Mobility: Sidelying to Sit;Rolling Rolling: Max assist;+2 for physical assistance Sidelying to sit: Max assist;+2 for physical assistance;+2 for safety/equipment     Sit to sidelying: Max assist;+2 for physical assistance General bed mobility comments: Max A +2 for log roll  Transfers Overall transfer level: Needs  assistance Equipment used: 2 person hand held assist Transfers: Sit to/from [employer] Sit to Stand: Max assist;+2 physical assistance;+2 safety/equipment         General transfer comment: Heavy Max A +2 for power and maintaining standing. Poor stanidng posture  Ambulation/Gait             General Gait Details: unable       Balance Overall balance assessment: Needs assistance Sitting-balance support: Single extremity supported;Bilateral upper extremity supported;Feet supported Sitting balance-Leahy Scale: Poor Sitting balance - Comments: Flucuating between Mod-Max A for sitting balance Postural control: Posterior lean Standing balance support: Bilateral upper extremity supported Standing balance-Leahy Scale: Zero Standing balance comment: Reliant on Max A + 2 for standing                            Cognition Arousal/Alertness: Awake/alert Behavior During Therapy: Flat affect;WFL for tasks assessed/performed Overall Cognitive Status: Difficult to assess Area of Impairment: Attention;Following commands;Awareness;Problem solving                   Current Attention Level: Sustained Memory: Decreased recall of precautions;Decreased short-term memory Following Commands: Follows one step commands with increased time Safety/Judgement: Decreased awareness of safety;Decreased awareness of deficits Awareness: Emergent Problem Solving: Slow processing;Decreased initiation;Requires verbal cues;Requires tactile cues General Comments: Pt able to write questions and comments. Fatigued and requiring increased time. Pt asking at end of session "Can I go home?" Reorienting to situation and location      Exercises Other Exercises Other Exercises: AAROM of BUEs.    General Comments General comments (skin integrity, edema, etc.):  VSS on vent. Spo2 dropping once to 85% but pleth line poor.      Pertinent Vitals/Pain Pain Assessment: 0-10 Pain  Score: 8  Pain Location: R hip Pain Descriptors / Indicators: Guarding;Moaning;Discomfort;Grimacing Pain Intervention(s): Limited activity within patient's tolerance;Monitored during session;Premedicated before session           PT Goals (current goals can now be found in the care plan section) Acute Rehab PT Goals Patient Stated Goal: home PT Goal Formulation: With patient Time For Goal Achievement: 11/26/20 Potential to Achieve Goals: Fair Progress towards PT goals: Progressing toward goals    Frequency    Min 5X/week      PT Plan Current plan remains appropriate    Co-evaluation PT/OT/SLP Co-Evaluation/Treatment: Yes Reason for Co-Treatment: Necessary to address cognition/behavior during functional activity;For patient/therapist safety;To address functional/ADL transfers PT goals addressed during session: Mobility/safety with mobility;Balance;Strengthening/ROM OT goals addressed during session: ADL's and self-care      AM-PAC PT "6 Clicks" Mobility   Outcome Measure  Help needed turning from your back to your side while in a flat bed without using bedrails?: A Lot Help needed moving from lying on your back to sitting on the side of a flat bed without using bedrails?: A Lot Help needed moving to and from a bed to a chair (including a wheelchair)?: A Lot Help needed standing up from a chair using your arms (e.g., wheelchair or bedside chair)?: A Lot Help needed to walk in hospital room?: Total Help needed climbing 3-5 steps with a railing? : Total 6 Click Score: 10    End of Session Equipment Utilized During Treatment: Oxygen;Gait belt Activity Tolerance: Patient limited by fatigue Patient left: in bed;with call bell/phone within reach;with bed alarm set;with family/visitor present;with SCD's reapplied Nurse Communication: Mobility status;Need for lift equipment PT Visit Diagnosis: Other abnormalities of gait and mobility (R26.89);Pain Pain - Right/Left:  Right Pain - part of body: Hip     Time: [id number] PT Time Calculation (min) (ACUTE ONLY): 31 min  Charges:  $Therapeutic Activity: 8-22 mins                     Karma Ganja, PT, DPT   Acute Rehabilitation Department Pager #: [phone]   Otho Bellows 11/12/2020, 5:44 PM

## 2020-11-12 NOTE — Progress Notes (Signed)
Patient ID: [name] [name], male   DOB: 1946-07-31, 74 y.o.   MRN: [id number] Alert, following commands. Intubated Encephalopathy if that is what it was has improved significantly. I believe it was hypoxemia causing the problem, not a metabolic issue.  Cervical wounds are clean without signs of infection.

## 2020-11-12 NOTE — Consult Note (Signed)
WOC Nurse wound follow up Patient receiving care in [facility] 4N26 Wound type: Unstageable to bridge of nose from former BiPAP use Measurement: 3 cm x 3 cm  Wound bed: 100% yellow and brown slough Drainage (amount, consistency, odor) bandaids on nose saturated with yellowish drainage Periwound: intact Dressing procedure/placement/frequency: continue current POC. Helmut Muster, RN, MSN, CWOCN, CNS-BC, pager [phone]

## 2020-11-12 NOTE — Progress Notes (Signed)
NAME:  [name] [name], MRN:  [id number], DOB:  1946-07-14, LOS: 63 ADMISSION DATE:  10/26/2020, CONSULTATION DATE:  10/28/20 REFERRING MD:  Sherley Bounds, MD CHIEF COMPLAINT:  Acute hypoxemic respiratory failure  Brief History   74 year old s/p posterior cervical laminectomy, hospital course complicated by respiratory failure, PNA, and mucous plugging.   History of present illness   [name] [name] is a 74 year old male with cervical stenosis. Admitted for ACDF on 12/10 and developed LUE weakness post-op requiring posterior cervical decompression on 12/11. PCCM called on 12/12 for post-op hypoxemia. Yesterday was on RA and needed supplemental O2 this morning which has now increased to NRB. Given lasix 20 mg. He is on NIV at home for OSA. Has not worn device since his first surgery.  Past Medical History  Cervical stenosis, chronic diastolic heart failure, OSA, COPD, neuropathy  Significant Hospital Events   12/10 ACDF C3/4 C5/6 12/11 Posterior cervical decompression C3-6 12/12 PCCM consulted for post-op hypoxemia 12/17-respiratory decompensation with hypoxemia, now on BiPAP 12/21 -Remains on BiPAP - FiO2 down to 50% with SpO2 98% Significantly more awake and interactive this morning, CXR - mucus plugging on Lt with volume loss  -initial suspicion for aspiration, tx with unasyn 12/12-12/17 -worsening resp status, infiltrate on CXR, persistent WBC - Tmax 102.2, thick secretions -- started rocephin 12/19  12/22 Febrile up to 101.8 overnight, Off BiPAP to [place], wound to bridge of nose noted after coming off BiPAP, strong cough, ongoing secretions, remains NPO, abx changed to cefepime 12/23 intubated worsening hypoxia delirium 12/25, added systemic steroids for worsening bronchospasm, added IV fentanyl drip, gave IV Lasix 12/26: Ventilator mechanics improved, physical exam improved.  Discontinuing fentanyl.,  Increased Lasix dosing, decrease PEEP to 5 from 8, hoping to reassess for  weaning Consults:  NSG PCCM  Procedures:  12/13 cortrak > 12/18 foley >> 12/23 ETT >>  Significant Diagnostic Tests:   MRI cord signal change C3-4 C5-6 CXR 10/28/20 - Left atelectasis CXR 12/13> increasing L opacity -- effusion vs obstructive atelectasis  Chest x-ray 12/16-left lower lobe atelectasis, small effusion 12/21 CXR> mucus plugging on Lt with volume loss   Micro Data:  SARS Covid 12/11 - neg 12/19 trach asp >> proteus vulgaris (resistant to ampicillin/ cefazolin)  Antimicrobials:  12/12 Unasyn -12/17 12/19 Rocephin > 12/22 12/22 cefepime >>  Interim history/subjective:  Complains of pain related to positioning, otherwise denies complaints.   Objective   Blood pressure 107/62, pulse (!) 50, temperature 99.9 F (37.7 C), temperature source Axillary, resp. rate 19, height 6\' 2"  (1.88 m), weight 119.4 kg, SpO2 98 %.    Vent Mode: CPAP;PSV FiO2 (%):  [40 %] 40 % Set Rate:  [24 bmp] 24 bmp Vt Set:  [570 mL] 570 mL PEEP:  [8 cmH20] 8 cmH20 Pressure Support:  [8 cmH20] 8 cmH20 Plateau Pressure:  [10 [id number] cmH20] 23 cmH20   Intake/Output Summary (Last 24 hours) at 11/12/2020 0958 Last data filed at 11/12/2020 0800 Gross per 24 hour  Intake 4178.76 ml  Output 4155 ml  Net 23.76 ml   Filed Weights   11/02/20 0500 11/06/20 0600 11/08/20 0442  Weight: 117.5 kg 118.8 kg 119.4 kg    Physical Exam:  General critically ill appearing man laying in bed in NAD HEENT [place]/AT, eyes anicteric, ETT in place. Resp: rhonchi bilaterally, R>L. Copious thin tan secretions. Vt ~500cc on PS 8/8. Weak cough. Cardiac: RRR, no murmur Abdomen: soft, NT, ND GU: yellow urine, foley in place  Neuro: RASS 0, not moving legs on command but moving arms. LUE weaker compared to right. Extremities: edema, bruising on arms.  Resolved Hospital Problem list   Hypernatremia Hypokalemia, mild >> replaced now hyperkalemic  Assessment & Plan:    Acute on chronic Hypoxic/hypercarbic  respiratory failure Proteus vulgaris HAP with mucous plugging, complicated by acute exacerbation of COPD Solu-Medrol added 12/25 for bronchospasm exam improved on 12/26 Portable chest x-ray from 12/25 patchy bilateral airspace disease Plan -con't LTVV, 4-8cc/kg IBW with goal Pplat<30 and DP<15. Meeting goals. -goal RASS 0 to -1. PAD protocol.  -complete 7 days of cefepime, reculture today given ongoing copious secretions that will limit extubation and leukocytosis -con't diuresis -Continue IV Solu-Medrol  -Continue brovana, stop pulmicort -VAP bundle    Acute multifactorial encephalopathy Delirium precautions, ? Sundowning, this was likely also exacerbated by pneumonia and hypoxia Plan -RASS goal 0 to -1 -Continue Precedex and seroquel -progressive mobility  Chronic diastolic heart failure, history of hypertension Plan -Continue IV Lasix, will escalate to every 8h -Continue telemetry monitoring -Holding verapamil, losartan, isosorbide as Bps remain low-normal   S/p ACDF/ laminectomy C3-C6 12/11 LUE allodynia, improving. Weak LUE on exam. Plan -Continuing gabapentin, as needed Valium  -oxycodone and fentanyl as needed    Hyperlipidemia -Lipitor  Diabetes w/ hyperglycemia- uncontrolled -adding levemir 25 units BID  -TF insulin decreased to 4 units Q4h with hold parameters -SSI PRN  Hypothyroidism -con't synthroid  Intermittent fluid and electrolyte imbalance -con't to monitor  Maceration around penis- improved -d/c foley   Nasal bridge pressure injury (BiPAP) -Continue dressing  Best practice (evaluated daily)   Diet: TF Pain/Anxiety/Delirium protocol (if indicated): pRN oxy. fent prn, precedex RASS 0 VAP protocol (if indicated): pulm hygiene  DVT prophylaxis: SQH  GI prophylaxis: PPI Glucose control: CBG q4h Mobility: BR last date of multidisciplinary goals of care discussion   12/22-  HCPOA is [name].  He is patient's neices husband/ son in law to  sister [name]. I updated him by phone and answered all questions.  [name] brought up [place] and stated patient would not want life-prolonging measures if he were to decompensate to that point.  At this time though, I feel patient will be able to recovery, if no major complications, but it will just be a prolonged recovery.    Code Status: Full code Disposition:  ICU   This patient is critically ill with multiple organ system failure which requires frequent high complexity decision making, assessment, support, evaluation, and titration of therapies. This was completed through the application of advanced monitoring technologies and extensive interpretation of multiple databases. During this encounter critical care time was devoted to patient care services described in this note for 35 minutes.  Julian Hy, DO 11/12/20 10:24 AM [facility]

## 2020-11-12 NOTE — Progress Notes (Signed)
Occupational Therapy Treatment Patient Details Name: [name] [name] MRN: [id number] DOB: [birth year] Today's Date: 11/12/2020    History of present illness Patient is a 74 y/o male who presents s/p C3-4, 4-5 ACDF 12/10 with post op LUE weakness requiring C3-6 posterior decompressive lami 12/11 complicated by post op hypoxemia. Reintubated on 12/23. PMH includes COPD, prostata CA, HTN, depression, CHF.   OT comments  Pt now intubated and with progressive weakness. Pt continues to present with decreased activity tolerance, balance, strength, and functional use of BUEs. Despite fatigue and pain, pt agreeable to therapy and sitting at EOB. Pt requiring Mod-Max A for sitting balance at EOB. Pt requiring Max A +2 for bed mobility and sit<>stand transfer. Pt unable achieve upright posture in standing. Continue to recommend dc to CIR for intensive therapy and will continue to follow acutely as admitted. Update OT goals.    Follow Up Recommendations  CIR;Supervision/Assistance - 24 hour    Equipment Recommendations  3 in 1 bedside commode    Recommendations for Other Services PT consult    Precautions / Restrictions Precautions Precautions: Fall;Cervical       Mobility Bed Mobility Overal bed mobility: Needs Assistance Bed Mobility: Sidelying to Sit;Rolling Rolling: Max assist;+2 for physical assistance Sidelying to sit: Max assist;+2 for physical assistance;+2 for safety/equipment     Sit to sidelying: Max assist;+2 for physical assistance General bed mobility comments: Max A +2 for log roll  Transfers Overall transfer level: Needs assistance Equipment used: 2 person hand held assist Transfers: Sit to/from [employer] Sit to Stand: Max assist;+2 physical assistance;+2 safety/equipment         General transfer comment: Heavy Max A +2 for power and maintaining standing. Poor stanidng posture    Balance Overall balance assessment: Needs assistance Sitting-balance  support: Single extremity supported;Bilateral upper extremity supported;Feet supported Sitting balance-Leahy Scale: Poor Sitting balance - Comments: Flucuating between Mod-Max A for sitting balance Postural control: Posterior lean Standing balance support: Bilateral upper extremity supported Standing balance-Leahy Scale: Zero Standing balance comment: Reliant on Max A + 2 for standing                           ADL either performed or assessed with clinical judgement   ADL Overall ADL's : Needs assistance/impaired Eating/Feeding: NPO   Grooming: Maximal assistance;Sitting Grooming Details (indicate cue type and reason): Max A for managing secretions and wiping mouth                             Functional mobility during ADLs: Maximal assistance;+2 for physical assistance;+2 for safety/equipment (sit<>stand) General ADL Comments: Focused session on activity tolerance, sitting balance, and sit<>stand     Vision       Perception     Praxis      Cognition Arousal/Alertness: Awake/alert Behavior During Therapy: Flat affect;WFL for tasks assessed/performed Overall Cognitive Status: Difficult to assess Area of Impairment: Attention;Following commands;Awareness;Problem solving                   Current Attention Level: Sustained Memory: Decreased recall of precautions;Decreased short-term memory Following Commands: Follows one step commands with increased time Safety/Judgement: Decreased awareness of safety;Decreased awareness of deficits Awareness: Emergent Problem Solving: Slow processing;Decreased initiation;Requires verbal cues;Requires tactile cues General Comments: Pt able to write questions and comments. Fatigued and requiring increased time. Pt asking at end of session "Can I go home?" Reorienting  to situation and location        Exercises Other Exercises Other Exercises: [place].   Shoulder Instructions       General Comments  VSS on vent. Spo2 dropping once to 85% but pleth line poor.    Pertinent Vitals/ Pain       Pain Assessment: 0-10 Pain Score: 8  Pain Location: R hip Pain Descriptors / Indicators: Guarding;Moaning;Discomfort;Grimacing Pain Intervention(s): Limited activity within patient's tolerance;Monitored during session;Repositioned  Home Living                                          Prior Functioning/Environment              Frequency  Min 2X/week        Progress Toward Goals  OT Goals(current goals can now be found in the care plan section)  Progress towards OT goals: Progressing toward goals  Acute Rehab OT Goals Patient Stated Goal: home OT Goal Formulation: With patient Time For Goal Achievement: 11/12/20 Potential to Achieve Goals: Good ADL Goals Pt Will Perform Grooming: with set-up;with supervision;sitting Pt Will Perform Upper Body Dressing: with min assist;sitting Pt Will Perform Lower Body Dressing: with mod assist;sit to/from stand;with adaptive equipment Pt Will Transfer to Toilet: with min assist;stand pivot transfer;bedside commode Additional ADL Goal #1: Pt will perform bed mobility with Min A in preparation for ADLs  Plan Discharge plan remains appropriate    Co-evaluation    PT/OT/SLP Co-Evaluation/Treatment: Yes Reason for Co-Treatment: For patient/therapist safety;To address functional/ADL transfers;Complexity of the patient's impairments (multi-system involvement)   OT goals addressed during session: ADL's and self-care      AM-PAC OT "6 Clicks" Daily Activity     Outcome Measure   Help from another person eating meals?: Total Help from another person taking care of personal grooming?: A Lot Help from another person toileting, which includes using toliet, bedpan, or urinal?: Total Help from another person bathing (including washing, rinsing, drying)?: Total Help from another person to put on and taking off regular upper body  clothing?: Total Help from another person to put on and taking off regular lower body clothing?: Total 6 Click Score: 7    End of Session Equipment Utilized During Treatment: Oxygen  OT Visit Diagnosis: Unsteadiness on feet (R26.81);Other abnormalities of gait and mobility (R26.89);Muscle weakness (generalized) (M62.81);Pain Pain - Right/Left: Left Pain - part of body: Arm   Activity Tolerance Patient tolerated treatment well   Patient Left with call bell/phone within reach;in bed;with bed alarm set   Nurse Communication Mobility status;Need for lift equipment        Time: [id number] OT Time Calculation (min): 33 min  Charges: OT General Charges $OT Visit: 1 Visit OT Treatments $Therapeutic Activity: 8-22 mins  [place], OTR/L Acute Rehab Pager: [phone] Office: [place] 11/12/2020, 4:31 PM

## 2020-11-13 DIAGNOSIS — J441 Chronic obstructive pulmonary disease with (acute) exacerbation: Secondary | ICD-10-CM

## 2020-11-13 DIAGNOSIS — Z9911 Dependence on respirator [ventilator] status: Secondary | ICD-10-CM | POA: Diagnosis not present

## 2020-11-13 DIAGNOSIS — J9601 Acute respiratory failure with hypoxia: Secondary | ICD-10-CM | POA: Diagnosis not present

## 2020-11-13 LAB — GLUCOSE, CAPILLARY
Glucose-Capillary: 161 mg/dL — ABNORMAL HIGH (ref 70–99)
Glucose-Capillary: 161 mg/dL — ABNORMAL HIGH (ref 70–99)
Glucose-Capillary: 177 mg/dL — ABNORMAL HIGH (ref 70–99)
Glucose-Capillary: 181 mg/dL — ABNORMAL HIGH (ref 70–99)
Glucose-Capillary: 235 mg/dL — ABNORMAL HIGH (ref 70–99)
Glucose-Capillary: 241 mg/dL — ABNORMAL HIGH (ref 70–99)

## 2020-11-13 LAB — PROCALCITONIN: Procalcitonin: <0.1 ng/mL

## 2020-11-13 LAB — BRAIN NATRIURETIC PEPTIDE: B Natriuretic Peptide: 70 pg/mL (ref 0.0–100.0)

## 2020-11-13 MED ORDER — INSULIN DETEMIR 100 UNIT/ML ~~LOC~~ SOLN
30.0000 [IU] | Freq: Two times a day (BID) | SUBCUTANEOUS | Status: DC
  Administered 2020-11-13 – 2020-11-26 (×20): 30 [IU] via SUBCUTANEOUS
  Filled 2020-11-13 (×30): qty 0.3

## 2020-11-13 MED ORDER — FREE WATER
200.0000 mL | Status: DC
  Administered 2020-11-13 – 2020-11-14 (×7): 200 mL

## 2020-11-13 MED ORDER — FLUCONAZOLE IN SODIUM CHLORIDE 400-0.9 MG/200ML-% IV SOLN
400.0000 mg | INTRAVENOUS | Status: DC
  Administered 2020-11-13 – 2020-11-14 (×2): 400 mg via INTRAVENOUS
  Filled 2020-11-13 (×2): qty 200

## 2020-11-13 MED ORDER — FUROSEMIDE 10 MG/ML IJ SOLN
60.0000 mg | Freq: Once | INTRAMUSCULAR | Status: AC
  Administered 2020-11-13: 14:00:00 60 mg via INTRAVENOUS
  Filled 2020-11-13: qty 6

## 2020-11-13 MED ORDER — REVEFENACIN 175 MCG/3ML IN SOLN
175.0000 ug | Freq: Every day | RESPIRATORY_TRACT | Status: DC
  Administered 2020-11-14 – 2020-11-29 (×16): 175 ug via RESPIRATORY_TRACT
  Filled 2020-11-13 (×16): qty 3

## 2020-11-13 MED ORDER — CHLORHEXIDINE GLUCONATE CLOTH 2 % EX PADS
6.0000 | MEDICATED_PAD | Freq: Every day | CUTANEOUS | Status: DC
  Administered 2020-11-13 – 2020-11-28 (×16): 6 via TOPICAL

## 2020-11-13 MED ORDER — METHYLPREDNISOLONE SODIUM SUCC 40 MG IJ SOLR
40.0000 mg | Freq: Every day | INTRAMUSCULAR | Status: DC
  Administered 2020-11-14 – 2020-11-15 (×2): 40 mg via INTRAVENOUS
  Filled 2020-11-13 (×2): qty 1

## 2020-11-13 MED ORDER — INSULIN ASPART 100 UNIT/ML ~~LOC~~ SOLN
6.0000 [IU] | SUBCUTANEOUS | Status: DC
  Administered 2020-11-13 – 2020-11-14 (×8): 6 [IU] via SUBCUTANEOUS

## 2020-11-13 NOTE — Progress Notes (Signed)
Sputum culture collected, sent to lab.  

## 2020-11-13 NOTE — Progress Notes (Addendum)
NAME:  [name] [name], MRN:  [id number], DOB:  07-16-1946, LOS: 27 ADMISSION DATE:  10/26/2020, CONSULTATION DATE:  10/28/20 REFERRING MD:  Sherley Bounds, MD CHIEF COMPLAINT:  Acute hypoxemic respiratory failure  Brief History   74 year old M with PMH of OSA on NIV at home, cervical stenosis admitted 12/10 for ACDF.  He developed LUE weakness post-op requiring posterior cervical laminectomy, hospital course complicated by respiratory failure, PNA, and mucous plugging.   Past Medical History  Cervical stenosis, chronic diastolic heart failure, OSA, COPD, neuropathy  Significant Hospital Events   12/10 ACDF C3/4 C5/6 12/11 Posterior cervical decompression C3-6 12/12 PCCM consulted for post-op hypoxemia 12/13 Suspected aspiration, treated with unasyn (12/12-12/17) 12/17 Respiratory decompensation with hypoxemia, now on BiPAP 12/21 On BiPAP - FiO2 down to 50%, more awake/interactive. Mucus plugging on left, fever 102.2. Rocephin.   12/22 Febrile up to 101.8 overnight, Off BiPAP to [place], wound to bridge of nose, abx changed to cefepime 12/23 Intubated for worsening hypoxia delirium 12/25 Added systemic steroids for worsening bronchospasm, added IV fentanyl drip, gave IV Lasix 12/26 Ventilator mechanics improved. Fentanyl stopped, lasix increased, PEEP decreased   Consults:  NSG PCCM  Procedures:  Cortrak 12/13 >> ETT 12/23 >> RUE PICC 12/23 >>   Significant Diagnostic Tests:   MRI 12/11 >> cord signal change C3-4 C5-6  CXR 12/12 >> Left atelectasis  CXR 12/13 >> increasing L opacity - effusion vs obstructive atelectasis   CXR 12/16 >> left lower lobe atelectasis, small effusion  CXR 12/21 >> mucus plugging on Lt with volume loss   Micro Data:  SARS Covid 12/11 >> neg Trach asp 12/19 >> proteus vulgaris (resistant to ampicillin/ cefazolin)  Antimicrobials:  Unasyn 12/12 >> 12/17 Rocephin 12/19 >> 12/22 Cefepime 12/22 >> 12/28 Diflucan 12/28 >>   Interim  history/subjective:  Afebrile  On PSV wean 8/5, 40% Glucose range 235-241 I/O 5.2L UOP, -928ml in last 24 hours   RN reports precedex stopped overnight for bradycardia (no associated hypotension)  Objective   Blood pressure (!) 144/69, pulse 72, temperature 98.8 F (37.1 C), temperature source Axillary, resp. rate 14, height 6\' 2"  (1.88 m), weight 114.7 kg, SpO2 97 %.    Vent Mode: CPAP;PSV FiO2 (%):  [40 %] 40 % Set Rate:  [24 bmp] 24 bmp Vt Set:  [570 mL] 570 mL PEEP:  [8 cmH20] 8 cmH20 Pressure Support:  [5 cmH20-8 cmH20] 5 cmH20 Plateau Pressure:  [19 cmH20-20 cmH20] 19 cmH20   Intake/Output Summary (Last 24 hours) at 11/13/2020 1052 Last data filed at 11/13/2020 0900 Gross per 24 hour  Intake 3522.1 ml  Output 5990 ml  Net -2467.9 ml   Filed Weights   11/06/20 0600 11/08/20 0442 11/13/20 0500  Weight: 118.8 kg 119.4 kg 114.7 kg    Physical Exam: General: chronically ill appearing adult male lying in bed in NAD  HEENT: MM pink/moist, ETT, pupils reactive / anicteric, anterior neck surgical incision c/d/i  Neuro: Awake, alert, follows commands / appropriate, LUE known weakness but appears improved  CV: s1s2 RRR, no m/r/g PULM: non-labored on vent, PSV wean, lungs bilaterally with faint wheezing  GI: soft, bsx4 active  Extremities: warm/dry, 1+ BLE pitting edema  Skin: no rashes or lesions   Resolved Hospital Problem list   Hypernatremia Hypokalemia, mild >> replaced now hyperkalemic Maceration around penis  Assessment & Plan:   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 Not on NIV during admit post surgery.  Course complicated by mucus pluggin, HAP. Solumedrol added 12/25 for bronchospasm.  -PRVC 8cc/kg  -wean PEEP / FiO2 for sats >90% -ongoing copious secretions,  hesitate to extubate given secretions / neck surgery -diuresis as below -continue cefepime, D7/7 cefepime, D10 abx -assess PCT  -wean steroids to 40 mg QD -continue brovana, add yupelri  -add diflucan given ongoing tracheal secretions, 40k colonies in tracheal aspirate  -follow cultures  -will need to ensure cuff leak prior to extubation given both anterior & posterior neck surgeries   Acute Multifactorial Encephalopathy Delirium precautions, ? Sundowning, this was likely also exacerbated by pneumonia and hypoxia -PAD protocol with PRN fentanyl for pain  -RASS Goal 0 to -1  -continue PRN fentanyl  -scheduled seroquel -PT efforts as able   Chronic diastolic heart failure, history of hypertension -lasix 40 mg IV Q8  -tele monitoring  -hold home verapamil, losartan, isorsorbide  S/p ACDF/ laminectomy C3-C6 12/11 LUE allodynia, improving. Weak LUE on exam. -continue gabapentin, PRN valium  -oxycodone, fentanyl PRN   Hyperlipidemia -lipitor   Diabetes w/ hyperglycemia - uncontrolled -increase levemir to 30 units BID  -increase TF coverage to 6 units Q4  -SSI, resistant scale  -watch closely with reduction of steroids, may need to wean insulin regimen   Hypothyroidism -continue synthroid   Anemia  -trend CBC  -transfuse for Hgb <7% or active bleeding   Intermittent fluid and electrolyte imbalance -monitor electrolytes, replace as indicated   Nasal bridge pressure injury (BiPAP) -protective dressing  -wound care per nursing   Moderate Protein Calorie Malnutrition  Albumin 1.7  -TF per Nutrition   Best practice (evaluated daily)  Diet: TF Pain/Anxiety/Delirium protocol (if indicated): pRN oxy. fent prn, precedex RASS 0 VAP protocol (if indicated): pulm hygiene  DVT prophylaxis: SQH  GI prophylaxis: PPI Glucose control: CBG q4h Mobility: BR Last date of multidisciplinary goals of care discussion:  12/22-HCPOA is Pennie Rushing.  He is patient's neices husband/ son in law  to sister Harriett Sine. Pennie Rushing brought up GOCs and stated patient would not want life-prolonging measures if he were to decompensate to that point.  At this time though, suspect patient will be able to recovery, if no major complications, but it will just be a prolonged recovery.   Code Status: Full code Disposition:  ICU   CC Time: 33 minutes    Canary Brim, MSN, NP-C, AGACNP-BC [facility] 11/13/2020, 11:05 AM   Please see [email or web address] for pager details.

## 2020-11-13 NOTE — Progress Notes (Signed)
Physical Therapy Treatment Patient Details Name: [name] [name] MRN: [id number] DOB: [birth year] Today's Date: 11/13/2020    History of Present Illness Patient is a 74 y/o male who presents s/p C3-4, 4-5 ACDF 12/10 with post op LUE weakness requiring C3-6 posterior decompressive lami [id number] complicated by post op hypoxemia. Reintubated on 12/23. PMH includes COPD, prostata CA, HTN, depression, CHF.    PT Comments    The pt was agreeable to session with focus on progressing OOB transfers this morning. The pt was able to initiate movements with BLE, but requires maxA of 2 to safely complete bed mobility and raise trunk from [facility]. The pt was then able to attempt standing x1 with stedy and achieved improved hip extension, but was unable to tolerate for >10 seconds and required maxA of 2 to maintain. The pt will continue to benefit from skilled PT to progress OOB tolerance, strength, power, and stability to improve tolerance for OOB transfers.    Follow Up Recommendations  CIR;Supervision for mobility/OOB;Supervision/Assistance - 24 hour     Equipment Recommendations  None recommended by PT    Recommendations for Other Services       Precautions / Restrictions Precautions Precautions: Fall;Cervical Precaution Comments: intubated Restrictions Weight Bearing Restrictions: No    Mobility  Bed Mobility Overal bed mobility: Needs Assistance Bed Mobility: Rolling;Sidelying to Sit;Sit to Sidelying Rolling: Max assist;+2 for physical assistance Sidelying to sit: Max assist;+2 for physical assistance;HOB elevated     Sit to sidelying: Max assist;+2 for physical assistance;HOB elevated General bed mobility comments: maxA of 2 to move BLE  Transfers Overall transfer level: Needs assistance Equipment used: Ambulation equipment used Transfers: Sit to/from Stand Sit to Stand: +2 physical assistance;Mod assist;Max assist         General transfer comment: mod/maxA to power up from EOB with  BUE support on stedy and assist from PT. assist to hips to facilitate improved hip extension which the pt could achieve with increased time.  Ambulation/Gait             General Gait Details: unable       Balance Overall balance assessment: Needs assistance Sitting-balance support: Single extremity supported;Bilateral upper extremity supported;Feet supported Sitting balance-Leahy Scale: Poor Sitting balance - Comments: able to maintain with minG by end of session with UE support on bed. minA initially to maintain Postural control: Posterior lean Standing balance support: Bilateral upper extremity supported Standing balance-Leahy Scale: Zero Standing balance comment: Reliant on Max A + 2 for standing                            Cognition Arousal/Alertness: Awake/alert Behavior During Therapy: Flat affect;WFL for tasks assessed/performed Overall Cognitive Status: Difficult to assess Area of Impairment: Attention;Following commands;Awareness;Problem solving                   Current Attention Level: Sustained Memory: Decreased recall of precautions;Decreased short-term memory Following Commands: Follows one step commands with increased time Safety/Judgement: Decreased awareness of safety;Decreased awareness of deficits Awareness: Emergent Problem Solving: Slow processing;Decreased initiation;Requires verbal cues;Requires tactile cues General Comments: Pt able to write questions, comments, but with decreased insight to severity of deficits, asking same question (can I have Ice chips?) multiple times through session. requires significant increased time and repeated cues at times for following commands      Exercises General Exercises - Lower Extremity Heel Slides: AAROM;Both;10 reps;Supine Hip ABduction/ADduction: AAROM;Both;10 reps;Supine    General Comments  General comments (skin integrity, edema, etc.): VSS on vent, pt asking for ice chips.       Pertinent Vitals/Pain Pain Assessment: 0-10 Pain Score: 7  Pain Location: headache, L hand Pain Descriptors / Indicators: Guarding;Moaning;Discomfort;Grimacing Pain Intervention(s): Limited activity within patient's tolerance;Monitored during session;Repositioned           PT Goals (current goals can now be found in the care plan section) Acute Rehab PT Goals Patient Stated Goal: home PT Goal Formulation: With patient Time For Goal Achievement: 11/26/20 Potential to Achieve Goals: Fair Progress towards PT goals: Progressing toward goals    Frequency    Min 5X/week      PT Plan Current plan remains appropriate       AM-PAC PT "6 Clicks" Mobility   Outcome Measure  Help needed turning from your back to your side while in a flat bed without using bedrails?: A Lot Help needed moving from lying on your back to sitting on the side of a flat bed without using bedrails?: A Lot Help needed moving to and from a bed to a chair (including a wheelchair)?: A Lot Help needed standing up from a chair using your arms (e.g., wheelchair or bedside chair)?: A Lot Help needed to walk in hospital room?: Total Help needed climbing 3-5 steps with a railing? : Total 6 Click Score: 10    End of Session Equipment Utilized During Treatment: Gait belt;Oxygen (vent, stedy) Activity Tolerance: Patient limited by fatigue Patient left: in bed;with call bell/phone within reach;with bed alarm set;with SCD's reapplied Nurse Communication: Mobility status;Need for lift equipment PT Visit Diagnosis: Other abnormalities of gait and mobility (R26.89);Pain Pain - Right/Left: Left Pain - part of body: Hand     Time: [id number] PT Time Calculation (min) (ACUTE ONLY): 48 min  Charges:  $Therapeutic Exercise: 8-22 mins $Therapeutic Activity: 23-37 mins                     Rolm Baptise, PT, DPT   Acute Rehabilitation Department Pager #: [phone]   Gaetana Michaelis 11/13/2020, 11:04 AM

## 2020-11-13 NOTE — Progress Notes (Signed)
  NEUROSURGERY PROGRESS NOTE   No issues overnight.   EXAM:  BP 106/64   Pulse (!) 57   Temp 98.8 F (37.1 C) (Axillary)   Resp 14   Ht 6\' 2"  (1.88 m)   Wt 114.7 kg   SpO2 99%   BMI 32.47 kg/m   Awake, alert Intubated, weaning Nods head appropriately Follows commands with all extremities, seemingly non-focal Incision: c/d/i  PLAN Stable neurologically  Weaning on vent Appreciate CCM assistance in management

## 2020-11-13 NOTE — Progress Notes (Signed)
Pharmacy Antibiotic Note  [name] [name] is a 74 y.o. male admitted on 10/26/2020 s/p pna tx with cefepime, ongoing tracheal secretions and some candida growing from BAL.  Pharmacy has been consulted for difucan dosing.  Plan: Fluconazole 400 mg IV q 24h Monitor renal function, clinical progression for LOT  Height: _0  (188 cm) Weight: 114.7 kg (252 lb 13.9 oz) IBW/kg (Calculated) : 82.2  Temp (24hrs), Avg:98.5 F (36.9 C), Min:97.7 F (36.5 C), Max:99.1 F (37.3 C)  Recent Labs  Lab 11/08/20 0225 11/08/20 1827 11/09/20 0202 11/09/20 1713 11/10/20 0541 11/11/20 0436 11/12/20 0525  WBC 16.2*  --  16.8*  --  15.3* 14.7* 15.3*  CREATININE 0.95   < > 1.03 0.91 0.76 0.68 0.80   < > = values in this interval not displayed.    Estimated Creatinine Clearance: 105.8 mL/min (by C-G formula based on SCr of 0.8 mg/dL).    Allergies  Allergen Reactions  . Ibuprofen     Other reaction(s): Angioedema   Unasyn 12/12 >> 12/17 CTX 12/19 >> 12/21 Cefepime 12/22 >> (12/28)  12/19 TA - Proteus vulgaris - R Amp/Ancef, other sens 12/23 BAL - 40K Candida 12/27 RA: no organisms seen  Bertis Ruddy, PharmD Clinical Pharmacist Please check AMION for all [place] numbers 11/13/2020 12:19 PM

## 2020-11-14 ENCOUNTER — Inpatient Hospital Stay (HOSPITAL_COMMUNITY): Payer: Medicare Other

## 2020-11-14 DIAGNOSIS — R739 Hyperglycemia, unspecified: Secondary | ICD-10-CM

## 2020-11-14 DIAGNOSIS — Z9911 Dependence on respirator [ventilator] status: Secondary | ICD-10-CM | POA: Diagnosis not present

## 2020-11-14 DIAGNOSIS — J441 Chronic obstructive pulmonary disease with (acute) exacerbation: Secondary | ICD-10-CM | POA: Diagnosis not present

## 2020-11-14 DIAGNOSIS — J9601 Acute respiratory failure with hypoxia: Secondary | ICD-10-CM | POA: Diagnosis not present

## 2020-11-14 LAB — GLUCOSE, CAPILLARY
Glucose-Capillary: 180 mg/dL — ABNORMAL HIGH (ref 70–99)
Glucose-Capillary: 146 mg/dL — ABNORMAL HIGH (ref 70–99)
Glucose-Capillary: 157 mg/dL — ABNORMAL HIGH (ref 70–99)
Glucose-Capillary: 191 mg/dL — ABNORMAL HIGH (ref 70–99)
Glucose-Capillary: 175 mg/dL — ABNORMAL HIGH (ref 70–99)
Glucose-Capillary: 173 mg/dL — ABNORMAL HIGH (ref 70–99)

## 2020-11-14 LAB — CBC
HCT: 27.6 % — ABNORMAL LOW (ref 39.0–52.0)
Hemoglobin: 8.6 g/dL — ABNORMAL LOW (ref 13.0–17.0)
MCH: 29.9 pg (ref 26.0–34.0)
MCHC: 31.2 g/dL (ref 30.0–36.0)
MCV: 95.8 fL (ref 80.0–100.0)
Platelets: 368 10*3/uL (ref 150–400)
RBC: 2.88 MIL/uL — ABNORMAL LOW (ref 4.22–5.81)
RDW: 13.6 % (ref 11.5–15.5)
WBC: 16 10*3/uL — ABNORMAL HIGH (ref 4.0–10.5)
nRBC: 0.1 % (ref 0.0–0.2)

## 2020-11-14 LAB — BASIC METABOLIC PANEL
Anion gap: 10 (ref 5–15)
BUN: 55 mg/dL — ABNORMAL HIGH (ref 8–23)
CO2: 30 mmol/L (ref 22–32)
Calcium: 8.9 mg/dL (ref 8.9–10.3)
Chloride: 100 mmol/L (ref 98–111)
Creatinine, Ser: 0.81 mg/dL (ref 0.61–1.24)
GFR, Estimated: >60 mL/min (ref >60)
Glucose, Bld: 174 mg/dL — ABNORMAL HIGH (ref 70–99)
Potassium: 3 mmol/L — ABNORMAL LOW (ref 3.5–5.1)
Sodium: 140 mmol/L (ref 135–145)

## 2020-11-14 LAB — PROCALCITONIN: Procalcitonin: <0.1 ng/mL

## 2020-11-14 MED ORDER — ORAL CARE MOUTH RINSE
15.0000 mL | Freq: Two times a day (BID) | OROMUCOSAL | Status: DC
  Administered 2020-11-15 – 2020-11-28 (×26): 15 mL via OROMUCOSAL

## 2020-11-14 MED ORDER — INSULIN ASPART 100 UNIT/ML ~~LOC~~ SOLN
6.0000 [IU] | Freq: Three times a day (TID) | SUBCUTANEOUS | Status: DC
  Administered 2020-11-15 – 2020-11-23 (×16): 6 [IU] via SUBCUTANEOUS

## 2020-11-14 MED ORDER — POTASSIUM CHLORIDE CRYS ER 20 MEQ PO TBCR
40.0000 meq | EXTENDED_RELEASE_TABLET | Freq: Once | ORAL | Status: AC
  Administered 2020-11-14: 17:00:00 40 meq via ORAL
  Filled 2020-11-14: qty 2

## 2020-11-14 MED ORDER — FENTANYL CITRATE (PF) 100 MCG/2ML IJ SOLN: 25.0000 ug | INTRAMUSCULAR | Status: DC | PRN

## 2020-11-14 MED ORDER — CHLORHEXIDINE GLUCONATE 0.12 % MT SOLN
15.0000 mL | Freq: Two times a day (BID) | OROMUCOSAL | Status: DC
  Administered 2020-11-14 – 2020-11-29 (×30): 15 mL via OROMUCOSAL
  Filled 2020-11-14 (×26): qty 15

## 2020-11-14 MED ORDER — OXYCODONE HCL 5 MG/5ML PO SOLN
5.0000 mg | Freq: Four times a day (QID) | ORAL | Status: DC | PRN
  Administered 2020-11-14 – 2020-11-21 (×10): 5 mg
  Filled 2020-11-14 (×10): qty 5

## 2020-11-14 MED ORDER — INSULIN ASPART 100 UNIT/ML ~~LOC~~ SOLN
0.0000 [IU] | Freq: Three times a day (TID) | SUBCUTANEOUS | Status: DC
  Administered 2020-11-14 – 2020-11-15 (×3): 4 [IU] via SUBCUTANEOUS
  Administered 2020-11-16 (×3): 3 [IU] via SUBCUTANEOUS
  Administered 2020-11-16: 11 [IU] via SUBCUTANEOUS
  Administered 2020-11-17 (×2): 4 [IU] via SUBCUTANEOUS
  Administered 2020-11-17: 3 [IU] via SUBCUTANEOUS
  Administered 2020-11-17: 4 [IU] via SUBCUTANEOUS

## 2020-11-14 MED ORDER — POTASSIUM CHLORIDE 10 MEQ/50ML IV SOLN
10.0000 meq | INTRAVENOUS | Status: AC
  Administered 2020-11-14 (×4): 10 meq via INTRAVENOUS
  Filled 2020-11-14 (×4): qty 50

## 2020-11-14 MED ORDER — POTASSIUM CHLORIDE 20 MEQ PO PACK
20.0000 meq | PACK | ORAL | Status: AC
Start: 2020-11-14 — End: 2020-11-14
  Administered 2020-11-14 (×2): 20 meq
  Filled 2020-11-14 (×2): qty 1

## 2020-11-14 NOTE — Progress Notes (Signed)
K+ 3.0, replaced per eLink protocol standing orders.

## 2020-11-14 NOTE — Progress Notes (Addendum)
Physical Therapy Treatment Patient Details Name: [name] [name] MRN: [id number] DOB: 05-Dec-1945 Today's Date: 11/14/2020    History of Present Illness Patient is a 74 y/o male who presents s/p C3-4, 4-5 ACDF 12/10 with post op LUE weakness requiring C3-6 posterior decompressive lami 12/11 complicated by post op hypoxemia. Reintubated on 12/23. PMH includes COPD, prostata CA, HTN, depression, CHF.    PT Comments     Pt fatigued, but agreeable to mobility this day s/p extubation. Pt requiring max +2 to move to and from EOB, but once sitting EOB pt able to support self statically. Pt tolerated EOB sitting x10 minutes, benefiting from anterior UE support to prevent posterior leaning. Pt performing scoots to EOB with max +2 assist and unable to come to standing with total +2, limited by fatigue. Will continue to follow acutely.   Of note, pt with R foot pain and swelling, RN notified and socks cut at the top to relieve pressure.    Follow Up Recommendations  CIR;Supervision for mobility/OOB;Supervision/Assistance - 24 hour     Equipment Recommendations  None recommended by PT    Recommendations for Other Services       Precautions / Restrictions Precautions Precautions: Fall;Cervical Precaution Comments: extubated 12/29, on 4LO2 today Restrictions Weight Bearing Restrictions: No    Mobility  Bed Mobility Overal bed mobility: Needs Assistance Bed Mobility: Supine to Sit;Sit to Supine     Supine to sit: Max assist;+2 for physical assistance Sit to supine: Max assist;+2 for physical assistance   General bed mobility comments: max +2 for trunk and LE management, helicopter method utilized to maintain spinal precautions.  Transfers Overall transfer level: Needs assistance   Transfers: Lateral/Scoot Transfers          Lateral/Scoot Transfers: Max assist;+2 physical assistance;+2 safety/equipment General transfer comment: Max +2 for boosting hips off bed, translating hips  towards HOB x3. Stand attempt x1, total +2 unsuccessful.  Ambulation/Gait             General Gait Details: unable   Stairs             Wheelchair Mobility    Modified Rankin (Stroke Patients Only)       Balance Overall balance assessment: Needs assistance Sitting-balance support: Single extremity supported;Bilateral upper extremity supported;Feet supported Sitting balance-Leahy Scale: Fair Sitting balance - Comments: initially min assist, transitioning to supervision to maintain upright balance; benefits from anterior support to keep trunk upright Postural control: Posterior lean Standing balance support: Bilateral upper extremity supported Standing balance-Leahy Scale: Zero                              Cognition Arousal/Alertness: Awake/alert Behavior During Therapy: WFL for tasks assessed/performed Overall Cognitive Status: Difficult to assess Area of Impairment: Attention;Following commands;Awareness;Problem solving                   Current Attention Level: Sustained Memory: Decreased recall of precautions;Decreased short-term memory Following Commands: Follows one step commands with increased time Safety/Judgement: Decreased awareness of safety;Decreased awareness of deficits Awareness: Emergent Problem Solving: Slow processing;Decreased initiation;Requires verbal cues;Requires tactile cues;Difficulty sequencing General Comments: Pt pleasant and cooperative today, but then states off the wall things like "there's a snake under my bed, a black snake, I put it there yesterday", and "there are ghosts in my house".      Exercises General Exercises - Lower Extremity Ankle Circles/Pumps: AAROM;Both;15 reps;Supine Short Arc Quad: AAROM;Both;15 reps;Supine  General Comments General comments (skin integrity, edema, etc.): vss on 4LO2      Pertinent Vitals/Pain Pain Assessment: Faces Faces Pain Scale: Hurts little more Pain Location:  neck Pain Descriptors / Indicators: Grimacing;Discomfort;Sore Pain Intervention(s): Limited activity within patient's tolerance;Monitored during session;Repositioned    Home Living                      Prior Function            PT Goals (current goals can now be found in the care plan section) Acute Rehab PT Goals Patient Stated Goal: home PT Goal Formulation: With patient Time For Goal Achievement: 11/26/20 Potential to Achieve Goals: Fair Progress towards PT goals: Progressing toward goals    Frequency    Min 5X/week      PT Plan Current plan remains appropriate    Co-evaluation              AM-PAC PT "6 Clicks" Mobility   Outcome Measure  Help needed turning from your back to your side while in a flat bed without using bedrails?: A Lot Help needed moving from lying on your back to sitting on the side of a flat bed without using bedrails?: A Lot Help needed moving to and from a bed to a chair (including a wheelchair)?: A Lot Help needed standing up from a chair using your arms (e.g., wheelchair or bedside chair)?: A Lot Help needed to walk in hospital room?: Total Help needed climbing 3-5 steps with a railing? : Total 6 Click Score: 10    End of Session Equipment Utilized During Treatment: Oxygen Activity Tolerance: Patient limited by fatigue Patient left: in bed;with call bell/phone within reach;with bed alarm set Nurse Communication: Mobility status PT Visit Diagnosis: Other abnormalities of gait and mobility (R26.89);Muscle weakness (generalized) (M62.81)     Time: [id number] PT Time Calculation (min) (ACUTE ONLY): 25 min  Charges:  $Therapeutic Activity: 8-22 mins $Neuromuscular Re-education: 8-22 mins                     Stacie Glaze, PT Acute Rehabilitation Services Pager [phone]  Office [phone]    Roxine Caddy E Ruffin Pyo 11/14/2020, 5:19 PM

## 2020-11-14 NOTE — Progress Notes (Signed)
Subjective: Patient reports Feels better than he has felt in a couple weeks.  Recently extubated and talking  Objective: Vital signs in last 24 hours: Temp:  [97.7 F (36.5 C)-98.9 F (37.2 C)] 98 F (36.7 C) (12/29 1150) Pulse Rate:  [53-77] 65 (12/29 1300) Resp:  [13-24] 18 (12/29 1300) BP: (98-151)/(54-79) 130/56 (12/29 1300) SpO2:  [95 %-100 %] 96 % (12/29 1300) FiO2 (%):  [40 %] 40 % (12/29 0842) Weight:  [111.2 kg] 111.2 kg (12/29 0500)  Intake/Output from previous day: 12/28 0701 - 12/29 0700 In: 3633.6 [I.V.:55.4; NG/GT:3140; IV Piggyback:438.2] Out: 3525 [Urine:3525] Intake/Output this shift: Total I/O In: 558 [I.V.:26.7; NG/GT:360; IV Piggyback:171.3] Out: 250 [Urine:250]  Moves all extremities antigravity left upper extremity weakness  Lab Results: Recent Labs    11/12/20 0525 11/14/20 0504  WBC 15.3* 16.0*  HGB 9.0* 8.6*  HCT 28.3* 27.6*  PLT 306 368   BMET Recent Labs    11/12/20 0525 11/14/20 0504  NA 138 140  K 4.5 3.0*  CL 104 100  CO2 27 30  GLUCOSE 204* 174*  BUN 47* 55*  CREATININE 0.80 0.81  CALCIUM 9.0 8.9    Studies/Results: DG Chest Port 1 View  Result Date: 11/14/2020 CLINICAL DATA:  Endotracheal tube.  COPD. EXAM: PORTABLE CHEST 1 VIEW COMPARISON:  Chest radiograph November 12, 2020. FINDINGS: The endotracheal tube, right-sided PICC and enteric tube are in similar position. Similar pulmonary vascular congestion and mildly enlarged cardiac silhouette. Small left pleural effusion. Left basilar opacities. No visible pneumothorax. IMPRESSION: 1. Stable support devices. 2. Small left pleural effusion with left basilar opacities, which could represent atelectasis, aspiration, and/or pneumonia. Electronically Signed   By: Feliberto Harts MD   On: 11/14/2020 07:20    Assessment/Plan: Doing well now extubated continue aggressive physical occupational speech therapy  LOS: 19 days     [name] [name] 11/14/2020, 2:39 PM

## 2020-11-14 NOTE — Progress Notes (Addendum)
NAME:  [name] [name], MRN:  [id number], DOB:  07-25-1946, LOS: 12 ADMISSION DATE:  10/26/2020, CONSULTATION DATE:  10/28/20 REFERRING MD:  Sherley Bounds, MD CHIEF COMPLAINT:  Acute hypoxemic respiratory failure  Brief History   74 year old M with PMH of OSA on NIV at home, cervical stenosis admitted 12/10 for ACDF.  He developed LUE weakness post-op requiring posterior cervical laminectomy, hospital course complicated by respiratory failure, PNA, and mucous plugging.   Past Medical History  Cervical stenosis, chronic diastolic heart failure, OSA, COPD, neuropathy  Significant Hospital Events   12/10 ACDF C3/4 C5/6 12/11 Posterior cervical decompression C3-6 12/12 PCCM consulted for post-op hypoxemia 12/13 Suspected aspiration, treated with unasyn (12/12-12/17) 12/17 Respiratory decompensation with hypoxemia, now on BiPAP 12/21 On BiPAP - FiO2 down to 50%, more awake/interactive. Mucus plugging on left, fever 102.2. Rocephin.   12/22 Febrile up to 101.8 overnight, Off BiPAP to [place], wound to bridge of nose, abx changed to cefepime 12/23 Intubated for worsening hypoxia delirium 12/25 Added systemic steroids for worsening bronchospasm, added IV fentanyl drip, gave IV Lasix 12/26 Ventilator mechanics improved. Fentanyl stopped, lasix increased, PEEP decreased  12/28 PSV wean but not extubated due to secretions, lasix   Consults:  NSG PCCM  Procedures:  Cortrak 12/13 >> ETT 12/23 >> RUE PICC 12/23 >>   Significant Diagnostic Tests:   MRI 12/11 >> cord signal change C3-4 C5-6  CXR 12/12 >> Left atelectasis  CXR 12/13 >> increasing L opacity - effusion vs obstructive atelectasis   CXR 12/16 >> left lower lobe atelectasis, small effusion  CXR 12/21 >> mucus plugging on Lt with volume loss   Micro Data:  SARS Covid 12/11 >> neg Trach asp 12/19 >> proteus vulgaris (resistant to ampicillin/ cefazolin)  Antimicrobials:  Unasyn 12/12 >> 12/17 Rocephin 12/19 >> 12/22 Cefepime  12/22 >> 12/28 Diflucan 12/28 >>   Interim history/subjective:  RN reports pt sedate this am, not as awake. Secretions improved.  Afebrile / WBC increased to 16 Glucose range 146-180 I/O 3.5L UOP, +146ml in last 24 hours (remains overall positive)  Objective   Blood pressure 120/66, pulse (!) 54, temperature 98.9 F (37.2 C), temperature source Axillary, resp. rate 19, height 6\' 2"  (1.88 m), weight 111.2 kg, SpO2 95 %.    Vent Mode: PRVC FiO2 (%):  [40 %] 40 % Set Rate:  [24 bmp] 24 bmp Vt Set:  [570 mL] 570 mL PEEP:  [8 cmH20] 8 cmH20 Pressure Support:  [5 cmH20] 5 cmH20 Plateau Pressure:  [19 cmH20-25 cmH20] 21 cmH20   Intake/Output Summary (Last 24 hours) at 11/14/2020 0948 Last data filed at 11/14/2020 0800 Gross per 24 hour  Intake 3597 ml  Output 2675 ml  Net 922 ml   Filed Weights   11/08/20 0442 11/13/20 0500 11/14/20 0500  Weight: 119.4 kg 114.7 kg 111.2 kg    Physical Exam: General: ill appearing adult male lying in bed on vent in NAD HEENT: MM pink/moist, ETT, anterior neck surgical incision c/d/i, pupils reactive  Neuro: Awakens, alert, follows commands / drifts back to sleep  CV: s1s2 RRR, no m/r/g PULM: non-labored on vent, lungs bilaterally clear  GI: soft, bsx4 active  Extremities: warm/dry, 1-2+ BLE pitting edema  Skin: no rashes or lesions   Resolved Hospital Problem list   Hypernatremia Hypokalemia, mild >> replaced now hyperkalemic Maceration around penis  Assessment & Plan:   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 Not on NIV during admit post surgery.  Course complicated by mucus pluggin, HAP. Solumedrol added 12/25 for bronchospasm.  -PRVC 8cc/kg, rate 24 with hx of hypercarbia  -wean PEEP / FiO2 for sats >90% -daily SBT / WUA  -hopeful for  extubation 12/29  -monitor off abx -continue diflucan  -PCT negative  -solumedrol 40 mg QD  -continue brovana + yupelri  -follow cultures to maturity  -will need to ensure cuff leak prior to extubation given both anterior / posterior surgical intervention  -reduce fentanyl dose range, reduce oxycodone -assess abg with drowsiness   Acute Multifactorial Encephalopathy Delirium precautions, ? Sundowning, this was likely also exacerbated by pneumonia and hypoxia -PAD protocol with PRN fentanyl  -RASS Goal 0 to -1  -PT efforts when able   Chronic diastolic heart failure, history of hypertension -continue lasix 40 mg IV Q8 -monitor renal function closely with diuresis  -tele monitoring  -hold home verapamil, isorsorbide, losartan   S/p ACDF/ laminectomy C3-C6 12/11 LUE allodynia, improving. Weak LUE on exam. -continue gabapentin, PRN valium  -oxycodone, PRN fentanyl for pain   Hyperlipidemia -lipitor   Diabetes w/ hyperglycemia - uncontrolled -glucose control improved 12/29  -continue levemir 30 units BID  -TF coverage 6 units Q4  -SSI, resistant scale   Hypothyroidism -synthroid PT   Anemia  -follow CBC  -transfuse for Hgb <7% or active bleeding   Intermittent fluid and electrolyte imbalance: Hypokalemia  -monitor electrolytes, replace as indicated.  -KCL 12/29 -f/u Mg+ in am   Nasal bridge pressure injury (BiPAP) -protective dressing  -wound care per nursing   Moderate Protein Calorie Malnutrition  Albumin 1.7  -TF per Nutrition   Best practice (evaluated daily)  Diet: TF Pain/Anxiety/Delirium protocol (if indicated): pRN oxy. fent prn, precedex RASS 0 VAP protocol (if indicated): pulm hygiene  DVT prophylaxis: SQH  GI prophylaxis: PPI Glucose control: CBG q4h Mobility: BR Last date of multidisciplinary goals of care discussion:  12/22-HCPOA is Pennie Rushing.  He is patient's neices husband / son in law to sister Harriett Sine. Pennie Rushing brought up GOCs and stated patient would  not want life-prolonging measures if he were to decompensate to that point.  At this time though, suspect patient will be able to recovery, if no major complications, but it will just be a prolonged recovery.   Code Status: Full code Disposition:  ICU   CC Time: 34 minutes    Canary Brim, MSN, NP-C, AGACNP-BC [facility] 11/14/2020, 9:48 AM   Please see [email or web address] for pager details.

## 2020-11-14 NOTE — Progress Notes (Signed)
Nutrition Follow-up  DOCUMENTATION CODES:   Not applicable  INTERVENTION:   Tube feeding via Cortrak tube: Pivot 1.5 at 60 ml/h (1440 ml per day) 90 ml ProSource TF BID  Provides 2320 kcal, 179 gm protein, 1092 ml free water daily 200 ml free water every 4 hours  Total free water: 2292 ml   MVI with minerals daily    NUTRITION DIAGNOSIS:   Inadequate oral intake related to inability to eat as evidenced by NPO status. Ongoing.   GOAL:   Patient will meet greater than or equal to 90% of their needs Met with TF.   MONITOR:   Diet advancement,TF tolerance  REASON FOR ASSESSMENT:   Consult Enteral/tube feeding initiation and management  ASSESSMENT:   Pt with PMH of cervial stenosis, CHF, OSA, and COPD admitted for ACDF on 12/10.   Pt discussed during ICU rounds and with RN.  Pt extubated today to 4L [place]. Continues to work with PT.   12/10 ACDF 12/11 s/p decompressive laminectomies  12/12 pt with hypoxia due to left pleural effusion and atelectasis  12/13 cortrak placed  12/17 requiring BiPAP support  12/23 intubated 12/29 extubated   Medications reviewed and include: vitamin D3, colace, lasix, SSI, 6 units novolog every 4 hours, levemir, solumedrol, MVI, miralax  Labs reviewed: K+3 CBG's: 146-180    Diet Order:   Diet Order    None      EDUCATION NEEDS:   No education needs have been identified at this time  Skin:  Skin Assessment:  (MARSI - penis) Skin Integrity Issues:: DTI DTI: nose Stage II: perineum  Last BM:  12/28 x 2  Height:   Ht Readings from Last 1 Encounters:  11/01/20 $RemoveB'6\' 2"'sCiScGZA$  (1.88 m)    Weight:   Wt Readings from Last 1 Encounters:  11/14/20 111.2 kg    Ideal Body Weight:  86.3 kg  BMI:  Body mass index is 31.48 kg/m.  Estimated Nutritional Needs:   Kcal:  2300-2500  Protein:  145-170 grams  Fluid:  2 L/day  Lockie Pares., RD, LDN, CNSC See AMiON for contact information

## 2020-11-14 NOTE — Procedures (Signed)
Extubation Procedure Note  Patient Details:   Name: [name] [name] DOB: 05/17/1946 MRN: [id number]   Airway Documentation:    Vent end date: 11/14/20 Vent end time: 1142   Evaluation  O2 sats: stable throughout Complications: No apparent complications Patient did tolerate procedure well. Bilateral Breath Sounds: Diminished   Patient extubated per MD order & placed on 4L [place]. Patient did have a cuff leak prior to extubation. Patient able to speak & cough post extubation.  Jacqulynn Cadet 11/14/2020, 11:48 AM

## 2020-11-15 ENCOUNTER — Inpatient Hospital Stay (HOSPITAL_COMMUNITY): Payer: Medicare Other

## 2020-11-15 DIAGNOSIS — J441 Chronic obstructive pulmonary disease with (acute) exacerbation: Secondary | ICD-10-CM | POA: Diagnosis not present

## 2020-11-15 DIAGNOSIS — J9601 Acute respiratory failure with hypoxia: Secondary | ICD-10-CM | POA: Diagnosis not present

## 2020-11-15 LAB — BASIC METABOLIC PANEL
Anion gap: 9 (ref 5–15)
BUN: 43 mg/dL — ABNORMAL HIGH (ref 8–23)
CO2: 31 mmol/L (ref 22–32)
Calcium: 9.5 mg/dL (ref 8.9–10.3)
Chloride: 100 mmol/L (ref 98–111)
Creatinine, Ser: 0.8 mg/dL (ref 0.61–1.24)
GFR, Estimated: >60 mL/min (ref >60)
Glucose, Bld: 83 mg/dL (ref 70–99)
Potassium: 3.6 mmol/L (ref 3.5–5.1)
Sodium: 140 mmol/L (ref 135–145)

## 2020-11-15 LAB — CULTURE, RESPIRATORY W GRAM STAIN: Culture: NORMAL

## 2020-11-15 LAB — CBC
HCT: 31.4 % — ABNORMAL LOW (ref 39.0–52.0)
Hemoglobin: 9.9 g/dL — ABNORMAL LOW (ref 13.0–17.0)
MCH: 30.2 pg (ref 26.0–34.0)
MCHC: 31.5 g/dL (ref 30.0–36.0)
MCV: 95.7 fL (ref 80.0–100.0)
Platelets: 430 10*3/uL — ABNORMAL HIGH (ref 150–400)
RBC: 3.28 MIL/uL — ABNORMAL LOW (ref 4.22–5.81)
RDW: 13.9 % (ref 11.5–15.5)
WBC: 20.6 10*3/uL — ABNORMAL HIGH (ref 4.0–10.5)
nRBC: 0.1 % (ref 0.0–0.2)

## 2020-11-15 LAB — GLUCOSE, CAPILLARY
Glucose-Capillary: 85 mg/dL (ref 70–99)
Glucose-Capillary: 96 mg/dL (ref 70–99)
Glucose-Capillary: 156 mg/dL — ABNORMAL HIGH (ref 70–99)
Glucose-Capillary: 191 mg/dL — ABNORMAL HIGH (ref 70–99)
Glucose-Capillary: 161 mg/dL — ABNORMAL HIGH (ref 70–99)

## 2020-11-15 LAB — MAGNESIUM: Magnesium: 2.4 mg/dL (ref 1.7–2.4)

## 2020-11-15 LAB — BRAIN NATRIURETIC PEPTIDE: B Natriuretic Peptide: 113.6 pg/mL — ABNORMAL HIGH (ref 0.0–100.0)

## 2020-11-15 LAB — PROCALCITONIN: Procalcitonin: <0.1 ng/mL

## 2020-11-15 MED ORDER — PROSOURCE TF PO LIQD
90.0000 mL | Freq: Two times a day (BID) | ORAL | Status: DC
  Administered 2020-11-15 – 2020-11-20 (×11): 90 mL
  Filled 2020-11-15 (×10): qty 90

## 2020-11-15 MED ORDER — PIVOT 1.5 CAL PO LIQD
1000.0000 mL | ORAL | Status: DC
  Administered 2020-11-15 – 2020-11-20 (×6): 1000 mL
  Filled 2020-11-15 (×11): qty 1000

## 2020-11-15 NOTE — Progress Notes (Signed)
  NEUROSURGERY PROGRESS NOTE   Pt seen and examined. No issues overnight. No complaints today  EXAM: Temp:  [97.6 F (36.4 C)-98.9 F (37.2 C)] 97.6 F (36.4 C) (12/30 0800) Pulse Rate:  [53-64] 59 (12/30 1300) Resp:  [14-20] 14 (12/30 1300) BP: (91-149)/(53-105) 111/66 (12/30 1300) SpO2:  [95 %-100 %] 95 % (12/30 1300) Weight:  [110.1 kg] 110.1 kg (12/30 0500) Intake/Output      12/29 0701 12/30 0700 12/30 0701 12/31 0700   I.V. (mL/kg) 187.2 (1.7) 50 (0.5)   NG/GT 540    IV Piggyback 361.9    Total Intake(mL/kg) 1089.1 (9.9) 50 (0.5)   Urine (mL/kg/hr) 4425 (1.7) 900 (1.1)   Stool 0    Total Output 4425 900   Net -3335.9 -850        Urine Occurrence 1 x    Stool Occurrence 2 x     Awake, alert, oriented Speech fluent MAE well, good strength BUE/BLE Wounds c/d/i  LABS: Lab Results  Component Value Date   CREATININE 0.80 11/15/2020   BUN 43 (H) 11/15/2020   NA 140 11/15/2020   K 3.6 11/15/2020   CL 100 11/15/2020   CO2 31 11/15/2020   Lab Results  Component Value Date   WBC 20.6 (H) 11/15/2020   HGB 9.9 (L) 11/15/2020   HCT 31.4 (L) 11/15/2020   MCV 95.7 11/15/2020   PLT 430 (H) 11/15/2020    IMPRESSION: - 74 y.o. male s/p ACDF and subsequent posterior cervical decompression several weeks ago, postoperative course complicated by VDRF. Extubated yesterday, appears to be doing well.  PLAN: - Cont supportive care, appreciate PCCM assistance - Cont PT/OT/SLP - Begin dispo planning, likely will need CIR   Lisbeth Renshaw, MD [facility]

## 2020-11-15 NOTE — Progress Notes (Signed)
Nutrition Follow-up  DOCUMENTATION CODES:   Not applicable  INTERVENTION:   Tube feeding via Cortrak tube: Pivot 1.5 at 60 ml/h (1440 ml per day) 90 ml ProSource TF BID  Provides 2320 kcal, 179 gm protein, 1092 ml free water daily  MVI with minerals daily    If able to start PO diet will supplement diet to meet nutrition needs. May need to consider continuing TF based on how well pt eating.   NUTRITION DIAGNOSIS:   Inadequate oral intake related to inability to eat as evidenced by NPO status. Ongoing.   GOAL:   Patient will meet greater than or equal to 90% of their needs Met with TF.   MONITOR:   Diet advancement,TF tolerance  REASON FOR ASSESSMENT:   Consult Enteral/tube feeding initiation and management  ASSESSMENT:   Pt with PMH of cervial stenosis, CHF, OSA, and COPD admitted for ACDF on 12/10.   Pt discussed during ICU rounds and with RN.  Per RN pt started on CHO modified diet after extubation but concern for secretion management noted this am and SLP consulted. Per SLP possible for silent aspiration and plan for MBS tomorrow.  Spoke with NP, ok to change pt to NPO and resume TF via Cortrak  12/10 ACDF 12/11 s/p decompressive laminectomies  12/12 pt with hypoxia due to left pleural effusion and atelectasis  12/13 cortrak placed  12/17 requiring BiPAP support  12/23 intubated 12/29 extubated, TF d/c'ed 12/30 SLP eval, NPO, TF resumed   Medications reviewed and include: vitamin D3, colace, lasix, SSI, 6 units novolog every 4 hours, levemir, solumedrol, MVI, miralax  Labs reviewed: BNP: 113 CBG's: 85-96 off TF    Diet Order:   Diet Order            Diet NPO time specified  Diet effective now                 EDUCATION NEEDS:   No education needs have been identified at this time  Skin:  Skin Assessment:  (MARSI - penis) Skin Integrity Issues:: DTI DTI: nose Stage II: perineum  Last BM:  12/29 x 2; medium  Height:   Ht Readings  from Last 1 Encounters:  11/01/20 _0  (1.88 m)    Weight:   Wt Readings from Last 1 Encounters:  11/15/20 110.1 kg    Ideal Body Weight:  86.3 kg  BMI:  Body mass index is 31.16 kg/m.  Estimated Nutritional Needs:   Kcal:  2300-2500  Protein:  145-170 grams  Fluid:  2 L/day  Lockie Pares., RD, LDN, CNSC See AMiON for contact information

## 2020-11-15 NOTE — Progress Notes (Signed)
Physical Therapy Treatment Patient Details Name: [name] [name] MRN: [id number] DOB: 03-May-1946 Today's Date: 11/15/2020    History of Present Illness Patient is a 74 y/o male who presents s/p C3-4, 4-5 ACDF 12/10 with post op LUE weakness requiring C3-6 posterior decompressive lami 12/11 complicated by post op hypoxemia. Reintubated on 12/23. PMH includes COPD, prostata CA, HTN, depression, CHF.    PT Comments    Pt reporting fatigue and hunger upon PT and OT arrival to room, but agreeable to EOB and attempted OOB mobility. Pt requiring increased phyiscal assist to maintain upright sitting today, with heavy posterior and R lateral leaning. PT providing truncal facilitation to improve posture and upright sitting. Pt unable to progress to standing or OOB today. PT continuing to follow while acute.      Follow Up Recommendations  CIR;Supervision for mobility/OOB;Supervision/Assistance - 24 hour     Equipment Recommendations  None recommended by PT    Recommendations for Other Services       Precautions / Restrictions Precautions Precautions: Fall;Cervical Precaution Comments: extubated 12/29, on 2LO2 Restrictions Weight Bearing Restrictions: No    Mobility  Bed Mobility Overal bed mobility: Needs Assistance Bed Mobility: Rolling;Sidelying to Sit;Sit to Supine Rolling: Mod assist;+2 for physical assistance Sidelying to sit: Max assist;+2 for physical assistance;HOB elevated   Sit to supine: Max assist;+2 for physical assistance   General bed mobility comments: mod assist for rolling bilaterally for pad placement, max +2 for supine<>sit for trunk and LE management. Increased time and effort  Transfers                    Ambulation/Gait                 Stairs             Wheelchair Mobility    Modified Rankin (Stroke Patients Only)       Balance Overall balance assessment: Needs assistance Sitting-balance support: Single extremity  supported;Bilateral upper extremity supported;Feet supported Sitting balance-Leahy Scale: Poor Sitting balance - Comments: posterior and R lateral leaning throughout, unable to support self more than 5 seconds. EOB sitting at 10 minutes, PT facilitation for chest opening, upright sitting Postural control: Posterior lean;Right lateral lean                                  Cognition Arousal/Alertness: Awake/alert Behavior During Therapy: WFL for tasks assessed/performed Overall Cognitive Status: Impaired/Different from baseline Area of Impairment: Attention;Following commands;Awareness;Problem solving;Safety/judgement                   Current Attention Level: Selective   Following Commands: Follows one step commands with increased time Safety/Judgement: Decreased awareness of safety;Decreased awareness of deficits Awareness: Emergent Problem Solving: Slow processing;Decreased initiation;Requires verbal cues;Requires tactile cues;Difficulty sequencing General Comments: Pt appropriate during session, follows commands with increased time and multimodal cuing.      Exercises      General Comments General comments (skin integrity, edema, etc.): vss, 2LO2      Pertinent Vitals/Pain Pain Assessment: Faces Faces Pain Scale: Hurts little more Pain Location: neck, arms, R foot Pain Descriptors / Indicators: Grimacing;Discomfort;Sore Pain Intervention(s): Limited activity within patient's tolerance;Monitored during session;Repositioned    Home Living                      Prior Function  PT Goals (current goals can now be found in the care plan section) Acute Rehab PT Goals Patient Stated Goal: home PT Goal Formulation: With patient Time For Goal Achievement: 11/26/20 Potential to Achieve Goals: Fair Progress towards PT goals: Progressing toward goals    Frequency    Min 5X/week      PT Plan Current plan remains appropriate     Co-evaluation PT/OT/SLP Co-Evaluation/Treatment: Yes Reason for Co-Treatment: For patient/therapist safety;To address functional/ADL transfers          AM-PAC PT "6 Clicks" Mobility   Outcome Measure  Help needed turning from your back to your side while in a flat bed without using bedrails?: A Lot Help needed moving from lying on your back to sitting on the side of a flat bed without using bedrails?: A Lot Help needed moving to and from a bed to a chair (including a wheelchair)?: Total Help needed standing up from a chair using your arms (e.g., wheelchair or bedside chair)?: Total Help needed to walk in hospital room?: Total Help needed climbing 3-5 steps with a railing? : Total 6 Click Score: 8    End of Session Equipment Utilized During Treatment: Oxygen Activity Tolerance: Patient limited by fatigue Patient left: in bed;with call bell/phone within reach;with bed alarm set;with family/visitor present Nurse Communication: Mobility status PT Visit Diagnosis: Other abnormalities of gait and mobility (R26.89);Muscle weakness (generalized) (M62.81)     Time: [id number] PT Time Calculation (min) (ACUTE ONLY): 26 min  Charges:  $Neuromuscular Re-education: 8-22 mins                     Stacie Glaze, PT Acute Rehabilitation Services Pager [phone]  Office [phone]   [place] E Ruffin Pyo 11/15/2020, 1:07 PM

## 2020-11-15 NOTE — Progress Notes (Signed)
  Speech Language Pathology Treatment: Dysphagia  Patient Details Name: [name] [name] MRN: [id number] DOB: 05-Jan-1946 Today's Date: 11/15/2020 Time: [id number] SLP Time Calculation (min) (ACUTE ONLY): 19 min  Assessment / Plan / Recommendation Clinical Impression  Pt intubated for 8 days, extubated yesterday and seen for swallow with caregiver present. Therapist reviewed MBS on 12/16 with pt/caregiver and recommendations of puree, pudding thick liquids. On diet for a day, developed respiratory distress requiring Bipap for approximately 4 days and ultimately intubated. RN reported he required NTS this am due to secretions but this session his quality was clear and vocal quality normal, affirms odonophagia. Cough was fair and able to expectorate mucous x 1. He is showing s/s penetration/aspiration with puree with consistent cough and throat clear. Multiple swallows expected given residue seen on MBS. Do not recommend po's other than ice chips after oral care and plan to repeat MBS tomorrow given clinical presentation, confirmed dysphagia and 8 day intubation.    HPI HPI: Pt is a 74 yo male s/p anterior cervical discetomy at C3/4 and C5/6 following removal of existing plate, admitted afterwards for sudden onset weakness and pain in his LUE. CT unremarkable; MRI pending. On 12/11 he was made NPO for difficulty swallowing. On 12/11 he underwent C3-6 posterior decompressive laminectomies during which he was intubated. PMH includes: thyroid ca, prostate ca, HTN, HLD, dyspnea, depression, COPD, CHF, anxiety. MBS 11/01/20 recommending Dys 1(puree) and pudding thick liqui. Following day had respiratory decline requiring Bipap until 12/23 when he was intubated and extubated 12/29. New order placed for swallow assessment (considered treat since already on caseload).      SLP Plan  MBS       Recommendations  Diet recommendations: NPO;Other(comment) (except few ice chips) Medication Administration: Via  alternative means                Oral Care Recommendations: Oral care QID Follow up Recommendations: Skilled Nursing facility SLP Visit Diagnosis: Dysphagia, pharyngoesophageal phase (R13.14);Dysphagia, pharyngeal phase (R13.13) Plan: MBS                      Royce Macadamia 11/15/2020, 1:55 PM  Breck Coons Lonell Face.Ed Nurse, children's [phone] Office [phone]

## 2020-11-15 NOTE — Progress Notes (Addendum)
Occupational Therapy Treatment Patient Details Name: [name] [name] MRN: [id number] DOB: [birth year] Today's Date: 11/15/2020    History of present illness Patient is a 74 y/o male who presents s/p C3-4, 4-5 ACDF 12/10 with post op LUE weakness requiring C3-6 posterior decompressive lami 12/11 complicated by post op hypoxemia. Reintubated 12/23-12/29. PMH includes COPD, prostata CA, HTN, depression, CHF.   OT comments  Upon arrival, pt supine in bed and now on 2L O2. Pt with increased arousal and participation in activity. Pt requiring Mod-Max A +2 for bed mobility. Pt sitting at EOB for ~10 minutes with Min-max A for sitting balance; presenting with posterior and right lateral leans. Able to correct posture with cues. Continue to recommend dc to CIR and will continue to follow acutely as admitted.    Follow Up Recommendations  CIR;Supervision/Assistance - 24 hour    Equipment Recommendations  3 in 1 bedside commode    Recommendations for Other Services PT consult    Precautions / Restrictions Precautions Precautions: Fall;Cervical Precaution Comments: extubated 12/29, on 2LO2 Restrictions Weight Bearing Restrictions: No       Mobility Bed Mobility Overal bed mobility: Needs Assistance Bed Mobility: Rolling;Sidelying to Sit;Sit to Supine Rolling: Mod assist;+2 for physical assistance Sidelying to sit: Max assist;+2 for physical assistance;HOB elevated   Sit to supine: Max assist;+2 for physical assistance Sit to sidelying: Max assist;+2 for physical assistance General bed mobility comments: mod assist for rolling bilaterally for pad placement, max +2 for supine<>sit for trunk and LE management. Increased time and effort  Transfers Overall transfer level: Needs assistance               General transfer comment: Defer due to fatigue    Balance Overall balance assessment: Needs assistance Sitting-balance support: Single extremity supported;Bilateral upper extremity  supported;Feet supported Sitting balance-Leahy Scale: Poor Sitting balance - Comments: posterior and R lateral leaning throughout, unable to support self more than 5 seconds. EOB sitting at 10 minutes, PT facilitation for chest opening, upright sitting Postural control: Posterior lean;Right lateral lean                                 ADL either performed or assessed with clinical judgement   ADL Overall ADL's : Needs assistance/impaired                                       General ADL Comments: Focused session on sitting balance and activity tolerance     Vision       Perception     Praxis      Cognition Arousal/Alertness: Awake/alert Behavior During Therapy: WFL for tasks assessed/performed Overall Cognitive Status: Impaired/Different from baseline Area of Impairment: Attention;Following commands;Awareness;Problem solving;Safety/judgement                   Current Attention Level: Selective Memory: Decreased recall of precautions;Decreased short-term memory Following Commands: Follows one step commands with increased time Safety/Judgement: Decreased awareness of safety;Decreased awareness of deficits Awareness: Emergent Problem Solving: Slow processing;Decreased initiation;Requires verbal cues;Requires tactile cues;Difficulty sequencing General Comments: Pt fatigues very quickly/ More appropriate. Following cues with increased time.        Exercises     Shoulder Instructions       General Comments Pt's caregiver, Mellody Dance, present throughout. VSS on 2L O2.    Pertinent Vitals/ Pain  Pain Assessment: Faces Faces Pain Scale: Hurts little more Pain Location: neck, arms, R foot Pain Descriptors / Indicators: Grimacing;Discomfort;Sore Pain Intervention(s): Monitored during session;Limited activity within patient's tolerance;Repositioned  Home Living                                          Prior  Functioning/Environment              Frequency  Min 2X/week        Progress Toward Goals  OT Goals(current goals can now be found in the care plan section)  Progress towards OT goals: Progressing toward goals  Acute Rehab OT Goals Patient Stated Goal: home OT Goal Formulation: With patient Time For Goal Achievement: 11/12/20 Potential to Achieve Goals: Good ADL Goals Pt Will Perform Grooming: with set-up;with supervision;sitting Pt Will Perform Upper Body Dressing: with mod assist;sitting Pt Will Perform Lower Body Dressing: with mod assist;sit to/from stand;with adaptive equipment Pt Will Transfer to Toilet: with mod assist;stand pivot transfer;bedside commode Additional ADL Goal #1: Pt will perform bed mobility with Mod A in preparation for ADLs  Plan Discharge plan remains appropriate    Co-evaluation    PT/OT/SLP Co-Evaluation/Treatment: Yes Reason for Co-Treatment: For patient/therapist safety;To address functional/ADL transfers   OT goals addressed during session: ADL's and self-care      AM-PAC OT "6 Clicks" Daily Activity     Outcome Measure   Help from another person eating meals?: Total Help from another person taking care of personal grooming?: A Lot Help from another person toileting, which includes using toliet, bedpan, or urinal?: Total Help from another person bathing (including washing, rinsing, drying)?: Total Help from another person to put on and taking off regular upper body clothing?: Total Help from another person to put on and taking off regular lower body clothing?: Total 6 Click Score: 7    End of Session Equipment Utilized During Treatment: Oxygen  OT Visit Diagnosis: Unsteadiness on feet (R26.81);Other abnormalities of gait and mobility (R26.89);Muscle weakness (generalized) (M62.81);Pain Pain - Right/Left: Left Pain - part of body: Arm   Activity Tolerance Patient tolerated treatment well   Patient Left with call bell/phone  within reach;in bed;with bed alarm set;with family/visitor present   Nurse Communication Mobility status;Need for lift equipment        Time: [phone] OT Time Calculation (min): 26 min  Charges: OT General Charges $OT Visit: 1 Visit OT Treatments $Therapeutic Activity: 8-22 mins  [place], OTR/L Acute Rehab Pager: [phone] Office: [place] 11/15/2020, 1:22 PM

## 2020-11-15 NOTE — Progress Notes (Addendum)
NAME:  [name] [name], MRN:  [id number], DOB:  1946-05-24, LOS: 62 ADMISSION DATE:  10/26/2020, CONSULTATION DATE:  10/28/20 REFERRING MD:  Sherley Bounds, MD CHIEF COMPLAINT:  Acute hypoxemic respiratory failure  Brief History   74 year old M with PMH of OSA on NIV at home, cervical stenosis admitted 12/10 for ACDF.  He developed LUE weakness post-op requiring posterior cervical laminectomy, hospital course complicated by respiratory failure, PNA, and mucous plugging.   Past Medical History  Cervical stenosis, chronic diastolic heart failure, OSA, COPD, neuropathy  Significant Hospital Events   12/10 ACDF C3/4 C5/6 12/11 Posterior cervical decompression C3-6 12/12 PCCM consulted for post-op hypoxemia 12/13 Suspected aspiration, treated with unasyn (12/12-12/17) 12/17 Respiratory decompensation with hypoxemia, now on BiPAP 12/21 On BiPAP - FiO2 down to 50%, more awake/interactive. Mucus plugging on left, fever 102.2. Rocephin.   12/22 Febrile up to 101.8 overnight, Off BiPAP to [place], wound to bridge of nose, abx changed to cefepime 12/23 Intubated for worsening hypoxia delirium 12/25 Added systemic steroids for worsening bronchospasm, added IV fentanyl drip, gave IV Lasix 12/26 Ventilator mechanics improved. Fentanyl stopped, lasix increased, PEEP decreased  12/28 PSV wean but not extubated due to secretions, lasix   Consults:  NSG PCCM  Procedures:  Cortrak 12/13 >> ETT 12/23 > 12/29 RUE PICC 12/23 >>   Significant Diagnostic Tests:   MRI 12/11 > cord signal change C3-4 C5-6  CXR 12/12 > Left atelectasis  CXR 12/13 > increasing L opacity - effusion vs obstructive atelectasis   CXR 12/16 > left lower lobe atelectasis, small effusion  CXR 12/21 > mucus plugging on Lt with volume loss   Micro Data:  SARS Covid 12/11 > neg Trach asp 12/19 > proteus vulgaris (resistant to ampicillin/ cefazolin) Trach asp 12/23 > candida albicans  Antimicrobials:  Unasyn 12/12 >  12/17 Rocephin 12/19 > 12/22 Cefepime 12/22 > 12/28 Diflucan 12/28 > 12/29   Interim history/subjective:  extubated 12/29. No events overnight. Denies pain.   Objective   Blood pressure (!) 96/56, pulse (!) 55, temperature 97.6 F (36.4 C), temperature source Oral, resp. rate 16, height 6\' 2"  (1.88 m), weight 110.1 kg, SpO2 95 %.        Intake/Output Summary (Last 24 hours) at 11/15/2020 0936 Last data filed at 11/15/2020 0800 Gross per 24 hour  Intake 935.72 ml  Output 4425 ml  Net -3489.28 ml   Filed Weights   11/13/20 0500 11/14/20 0500 11/15/20 0500  Weight: 114.7 kg 111.2 kg 110.1 kg    Physical Exam: General: elderly male, sitting in bed  HEENT: anterior neck surgical incision c/d/i Neuro: alert, oriented, follows commands  CV: s1s2 RRR, no m/r/g PULM: coarse breath sounds, rhonchi in upper > clears with cough GI: soft, non-tender, non-distended, active bowel sounds  Extremities: warm/dry, +1 generalized edema  Skin: no rashes or lesions   Resolved Hospital Problem list   Hypernatremia Hypokalemia, mild >> replaced now hyperkalemic Maceration around penis  Assessment & Plan:   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 Not on NIV during admit post surgery.  Course complicated by mucus pluggin, HAP. Solumedrol added 12/25 for bronchospasm. Extubated 12/29 Plan -monitor off abx -continue diflucan  -solumedrol 40 mg QD >> D/C today.  -continue brovana + yupelri  -follow cultures to maturity   Leucocytosis, question component of steroid use  CXR with left side volume loss   WBC 20 (16), remains afebrile, Trach asp +canidada on 12/23  Plan -Trend WBC and Fever Curve  Acute Multifactorial Encephalopathy >> Improved  Delirium precautions, ? Sundowning, this was  likely also exacerbated by pneumonia and hypoxia Plan -PT efforts when able   Chronic diastolic heart failure history of hypertension Plan -continue lasix 40 mg IV Q8 >> with 4.4 out 12/29. Remains 4.4L net positive.  -monitor renal function closely with diuresis  -tele monitoring  -hold home verapamil, isorsorbide, losartan   S/p ACDF/ laminectomy C3-C6 12/11 LUE allodynia, improving. Weak LUE on exam. Plan -continue gabapentin, PRN valium  -oxycodone PRN   Hyperlipidemia Plan -lipitor   Diabetes w/ hyperglycemia - uncontrolled Plan -continue levemir 30 units BID  -6 units with meals  -SSI, resistant scale   Hypothyroidism Plan -synthroid PT   Anemia  Plan -follow CBC  -transfuse for Hgb <7% or active bleeding   Intermittent fluid and electrolyte imbalance: Hypokalemia  Plan -monitor electrolytes, replace as indicated.   Nasal bridge pressure injury (BiPAP) Plan -protective dressing  -wound care per nursing   Moderate Protein Calorie Malnutrition  Albumin 1.7  Plan -TF per Nutrition >> on hold now with diet ordered however with coughing/rhonchi will order ST evaluation   Best practice (evaluated daily)  Diet: Carb Mod DVT prophylaxis: SQH  GI prophylaxis: PPI Glucose control: CBG q4h Mobility: BR Last date of multidisciplinary goals of care discussion:  12/22-HCPOA is Pennie Rushing.  He is patient's neices husband / son in law to sister Harriett Sine. Pennie Rushing brought up GOCs and stated patient would not want life-prolonging measures if he were to decompensate to that point.  At this time though, suspect patient will be able to recovery, if no major complications, but it will just be a prolonged recovery.   Code Status: Full code Disposition:  ICU   CC Time: 32 minutes   Jovita Kussmaul, AGACNP-BC [facility]  Pgr: [phone]  PCCM Pgr: [phone]

## 2020-11-16 ENCOUNTER — Inpatient Hospital Stay (HOSPITAL_COMMUNITY): Payer: Medicare Other

## 2020-11-16 DIAGNOSIS — M5412 Radiculopathy, cervical region: Secondary | ICD-10-CM | POA: Diagnosis not present

## 2020-11-16 DIAGNOSIS — J441 Chronic obstructive pulmonary disease with (acute) exacerbation: Secondary | ICD-10-CM | POA: Diagnosis not present

## 2020-11-16 LAB — BASIC METABOLIC PANEL
Anion gap: 13 (ref 5–15)
BUN: 61 mg/dL — ABNORMAL HIGH (ref 8–23)
CO2: 27 mmol/L (ref 22–32)
Calcium: 9.1 mg/dL (ref 8.9–10.3)
Chloride: 98 mmol/L (ref 98–111)
Creatinine, Ser: 1 mg/dL (ref 0.61–1.24)
GFR, Estimated: >60 mL/min (ref >60)
Glucose, Bld: 230 mg/dL — ABNORMAL HIGH (ref 70–99)
Potassium: 4.7 mmol/L (ref 3.5–5.1)
Sodium: 138 mmol/L (ref 135–145)

## 2020-11-16 LAB — CBC
HCT: 30.2 % — ABNORMAL LOW (ref 39.0–52.0)
Hemoglobin: 10 g/dL — ABNORMAL LOW (ref 13.0–17.0)
MCH: 31.2 pg (ref 26.0–34.0)
MCHC: 33.1 g/dL (ref 30.0–36.0)
MCV: 94.1 fL (ref 80.0–100.0)
Platelets: 430 10*3/uL — ABNORMAL HIGH (ref 150–400)
RBC: 3.21 MIL/uL — ABNORMAL LOW (ref 4.22–5.81)
RDW: 14.5 % (ref 11.5–15.5)
WBC: 17.5 10*3/uL — ABNORMAL HIGH (ref 4.0–10.5)
nRBC: 0 % (ref 0.0–0.2)

## 2020-11-16 LAB — GLUCOSE, CAPILLARY
Glucose-Capillary: 251 mg/dL — ABNORMAL HIGH (ref 70–99)
Glucose-Capillary: 148 mg/dL — ABNORMAL HIGH (ref 70–99)
Glucose-Capillary: 139 mg/dL — ABNORMAL HIGH (ref 70–99)
Glucose-Capillary: 146 mg/dL — ABNORMAL HIGH (ref 70–99)
Glucose-Capillary: 155 mg/dL — ABNORMAL HIGH (ref 70–99)

## 2020-11-16 LAB — PHOSPHORUS: Phosphorus: 4.1 mg/dL (ref 2.5–4.6)

## 2020-11-16 LAB — MAGNESIUM: Magnesium: 2.5 mg/dL — ABNORMAL HIGH (ref 1.7–2.4)

## 2020-11-16 MED ORDER — SODIUM CHLORIDE 3 % IN NEBU
4.0000 mL | INHALATION_SOLUTION | Freq: Two times a day (BID) | RESPIRATORY_TRACT | Status: AC
  Administered 2020-11-16 – 2020-11-17 (×2): 4 mL via RESPIRATORY_TRACT
  Filled 2020-11-16 (×3): qty 4

## 2020-11-16 MED ORDER — ISOSORBIDE MONONITRATE ER 30 MG PO TB24
30.0000 mg | ORAL_TABLET | Freq: Every day | ORAL | Status: DC
  Filled 2020-11-16: qty 1

## 2020-11-16 MED ORDER — LOSARTAN POTASSIUM 50 MG PO TABS
50.0000 mg | ORAL_TABLET | Freq: Every day | ORAL | Status: DC
  Filled 2020-11-16: qty 1

## 2020-11-16 NOTE — Progress Notes (Addendum)
Modified Barium Swallow Progress Note  Patient Details  Name: KHAYRI KARGBO MRN: [id number] Date of Birth: 1946/04/18  Today's Date: 11/16/2020  Modified Barium Swallow completed.  Full report located under Chart Review in the Imaging Section.  Brief recommendations include the following:  Clinical Impression  Pt exhibits a mild-moderate pharyngoesophageal dysphagia marked by aspiration and esophgeal involvement. His laryngeal elevation and anterior hyoid movement/strength appears adequate as well as timing of swallow onset. One instance of aspiration from thin barium falling from oral cavity and aspirating  before swallow onset occured(silent). His vallecular and pyriform sinus residue was mild and noted to have backflow from his UES. When esophagus scanned it revealed barium filling most of esophagus and never completely emptying despite thinner consistencies with thin and nectar. Therapist concerned that thicker consistencies will fill esophagus, reflux and possibly aspirating after the swallow. He reports having esophageal dilation previously and was having problems with esophagus prior to admission. Pt noted to cough during study when barium not obseved in vestibule or trachea. Therapist recommends more conservative approach with allowing nectar thick liquids twice a day from floor stock from [employer] only with full supervision and therapist can work with pt on increasing to full meal trays. He may need work up for esophagus after discharge or if symptoms continue   Swallow Evaluation Recommendations       SLP Diet Recommendations: Other (Comment) (nectar thick liquids twice a day)   Liquid Administration via: Spoon   Medication Administration: Crushed with puree   Supervision: Full assist for feeding           Oral Care Recommendations: Oral care QID        Royce Macadamia 11/16/2020,2:22 PM  Breck Coons Lonell Face.Ed Nurse, children's  [phone] Office [phone]

## 2020-11-16 NOTE — Progress Notes (Signed)
   Providing Compassionate, Quality Care - Together  NEUROSURGERY PROGRESS NOTE   S: No issues overnight.  Just returned from MBS O: EXAM:  BP (!) 111/47   Pulse (!) 56   Temp (!) 97.5 F (36.4 C) (Oral)   Resp 17   Ht 6\' 2"  (1.88 m)   Wt 107.3 kg   SpO2 100%   BMI 30.37 kg/m   Awake, alert, oriented  Speech fluent, appropriate  PERRLA CNs grossly intact  Moves extremities well Anterior neck, soft incision clean dry intact Posterior neck incision clean dry intact  ASSESSMENT:  74 y.o. male with  Cervical spondylosis status post anterior/posterior instrumentation fusion VDRF  PLAN: -Now extubated, appears to be doing well -PT/OT -Continue supportive measures     Thank you for allowing me to participate in this patient's care.  Please do not hesitate to call with questions or concerns.   71, DO Neurosurgeon [facility] Cell: [phone]

## 2020-11-16 NOTE — Progress Notes (Signed)
Physical Therapy Treatment Patient Details Name: [name] [name] MRN: [id number] DOB: [birth year] Today's Date: 11/16/2020    History of Present Illness Patient is a 74 y/o male who presents s/p C3-4, 4-5 ACDF 12/10 with post op LUE weakness requiring C3-6 posterior decompressive lami [id number] complicated by post op hypoxemia. Reintubated on 12/23-12/29. PMH includes COPD, prostata CA, HTN, depression, CHF.    PT Comments    Pt seen towards end of day and pt reporting fatigue from not sleeping well last night. Pt tolerated EOB sitting x10 minutes, but struggled to maintain upright without PT assist and external support for UEs. Pt declines OOB attempts again today. VSS throughout. PT to continue to progress mobility as able.     Follow Up Recommendations  CIR;Supervision for mobility/OOB;Supervision/Assistance - 24 hour     Equipment Recommendations  None recommended by PT    Recommendations for Other Services       Precautions / Restrictions Precautions Precautions: Fall;Cervical Precaution Comments: extubated 12/29, on 2LO2 Restrictions Weight Bearing Restrictions: No    Mobility  Bed Mobility Overal bed mobility: Needs Assistance Bed Mobility: Supine to Sit;Sit to Supine     Supine to sit: Max assist Sit to supine: Max assist   General bed mobility comments: max assist for trunk and LE management, scooting to and from EOB, boost up in bed upon return to supine  Transfers                 General transfer comment: pt declines due to fatigue  Ambulation/Gait                 Stairs             Wheelchair Mobility    Modified Rankin (Stroke Patients Only)       Balance Overall balance assessment: Needs assistance Sitting-balance support: Single extremity supported;Bilateral upper extremity supported;Feet supported Sitting balance-Leahy Scale: Poor Sitting balance - Comments: posterior and R lateral leaning throughout, requiring min PT assist  and use of anterior support to correct. EOB sitting x10 minutes, with reaching tasks, maintaining upright balance Postural control: Posterior lean;Right lateral lean                                  Cognition Arousal/Alertness: Awake/alert Behavior During Therapy: WFL for tasks assessed/performed Overall Cognitive Status: Impaired/Different from baseline Area of Impairment: Attention;Following commands;Awareness;Problem solving;Safety/judgement                 Orientation Level: Disoriented to;Situation;Place Current Attention Level: Sustained Memory: Decreased recall of precautions;Decreased short-term memory Following Commands: Follows one step commands with increased time Safety/Judgement: Decreased awareness of safety;Decreased awareness of deficits Awareness: Emergent Problem Solving: Slow processing;Decreased initiation;Requires verbal cues;Requires tactile cues;Difficulty sequencing General Comments: Pt fatigues very quickly this day, keeps thinking he is at home even with frequent verbal cues from PT that he is in the hospital      Exercises      General Comments General comments (skin integrity, edema, etc.): vss      Pertinent Vitals/Pain Pain Assessment: Faces Faces Pain Scale: Hurts little more Pain Location: neck Pain Descriptors / Indicators: Grimacing;Discomfort;Sore Pain Intervention(s): Limited activity within patient's tolerance;Monitored during session;Repositioned    Home Living                      Prior Function  PT Goals (current goals can now be found in the care plan section) Acute Rehab PT Goals Patient Stated Goal: home PT Goal Formulation: With patient Time For Goal Achievement: 11/26/20 Potential to Achieve Goals: Fair Progress towards PT goals: Progressing toward goals    Frequency    Min 3X/week      PT Plan Current plan remains appropriate    Co-evaluation              AM-PAC PT  "6 Clicks" Mobility   Outcome Measure  Help needed turning from your back to your side while in a flat bed without using bedrails?: A Lot Help needed moving from lying on your back to sitting on the side of a flat bed without using bedrails?: A Lot Help needed moving to and from a bed to a chair (including a wheelchair)?: Total Help needed standing up from a chair using your arms (e.g., wheelchair or bedside chair)?: Total Help needed to walk in hospital room?: Total Help needed climbing 3-5 steps with a railing? : Total 6 Click Score: 8    End of Session Equipment Utilized During Treatment: Oxygen Activity Tolerance: Patient limited by fatigue Patient left: in bed;with call bell/phone within reach;with bed alarm set Nurse Communication: Mobility status PT Visit Diagnosis: Other abnormalities of gait and mobility (R26.89);Muscle weakness (generalized) (M62.81)     Time: 7517-0017 PT Time Calculation (min) (ACUTE ONLY): 16 min  Charges:  $Therapeutic Activity: 8-22 mins                     Marye Round, PT Acute Rehabilitation Services Pager [phone]  Office [phone]    Tyrone Apple E Christain [place] 11/16/2020, 5:04 PM

## 2020-11-16 NOTE — Progress Notes (Signed)
  Speech Language Pathology Patient Details Name: SHELIA MAGALLON MRN: [id number] DOB: [birth year] Today's Date: 11/16/2020 Time:  -     MBS scheduled 10:00 today                 Royce Macadamia 11/16/2020, 9:21 AM  Breck Coons Lonell Face.Ed Nurse, children's [phone] Office [phone]

## 2020-11-16 NOTE — Plan of Care (Signed)
  Problem: Clinical Measurements: Goal: Ability to maintain clinical measurements within normal limits will improve Outcome: Progressing Goal: Will remain free from infection Outcome: Progressing Goal: Diagnostic test results will improve Outcome: Progressing Goal: Cardiovascular complication will be avoided Outcome: Progressing   Problem: Nutrition: Goal: Adequate nutrition will be maintained Outcome: Progressing   Problem: Coping: Goal: Level of anxiety will decrease Outcome: Progressing   Problem: Elimination: Goal: Will not experience complications related to bowel motility Outcome: Progressing Goal: Will not experience complications related to urinary retention Outcome: Progressing   Problem: Pain Managment: Goal: General experience of comfort will improve Outcome: Progressing   Problem: Safety: Goal: Ability to remain free from injury will improve Outcome: Progressing

## 2020-11-16 NOTE — Progress Notes (Addendum)
NAME:  [name] [name], MRN:  [id number], DOB:  December 06, 1945, LOS: 21 ADMISSION DATE:  10/26/2020, CONSULTATION DATE:  12/12 REFERRING MD:  Ronnald Ramp, CHIEF COMPLAINT:  Left arm weakness   Brief History:  74 year old M with PMH of OSA on NIV at home, cervical stenosis admitted 12/10 for ACDF.  He developed LUE weakness post-op requiring posterior cervical laminectomy, hospital course complicated by respiratory failure, PNA, and mucous plugging.   Past Medical History:  Cervical stenosis Chronic diastolic heart failure OSA COPD Neuropathy  Significant Hospital Events:  12/10 ACDF C3/4 C5/6 12/11 Posterior cervical decompression C3-6 12/12 PCCM consulted for post-op hypoxemia 12/13 Suspected aspiration, treated with unasyn (12/12-12/17) 12/17 Respiratory decompensation with hypoxemia, now on BiPAP 12/21 On BiPAP - FiO2 down to 50%, more awake/interactive. Mucus plugging on left, fever 102.2. Rocephin.   12/22 Febrile up to 101.8 overnight, Off BiPAP to [place], wound to bridge of nose, abx changed to cefepime 12/23 Intubated for worsening hypoxia delirium 12/25 Added systemic steroids for worsening bronchospasm, added IV fentanyl drip, gave IV Lasix 12/26 Ventilator mechanics improved. Fentanyl stopped, lasix increased, PEEP decreased  12/28 PSV wean but not extubated due to secretions, lasix  12/30 NTS suctioning required 12/31 awake, wants to get out of bed, wants to eat  Consults:  PCCM  Procedures:  Cortrak 12/13 >> ETT 12/23 > 12/29 RUE PICC 12/23 >>    Significant Diagnostic Tests:   MRI 12/11 > cord signal change C3-4 C5-6  CXR 12/12 > Left atelectasis  CXR 12/13 > increasing L opacity - effusion vs obstructive atelectasis   CXR 12/16 > left lower lobe atelectasis, small effusion  CXR 12/21 > mucus plugging on Lt with volume loss   Micro Data:  SARS Covid 12/11 > neg Trach asp 12/19 > proteus vulgaris (resistant to ampicillin/ cefazolin) Trach asp 12/23 > candida  albicans  Antimicrobials:  Unasyn 12/12 > 12/17 Rocephin 12/19 > 12/22 Cefepime 12/22 > 12/28 Diflucan 12/28 > 12/29   Interim History / Subjective:  Remains extubated Still has wet, weak cough Speaking to me clearly this morning Wants to eat  Objective   Blood pressure 116/60, pulse 62, temperature 98.5 F (36.9 C), temperature source Axillary, resp. rate 17, height 6\' 2"  (1.88 m), weight 107.3 kg, SpO2 97 %.    Vent Mode: PSV;BIPAP FiO2 (%):  [40 %] 40 % Set Rate:  [15 bmp] 15 bmp PEEP:  [5 cmH20] 5 cmH20 Pressure Support:  [10 cmH20] 10 cmH20   Intake/Output Summary (Last 24 hours) at 11/16/2020 0801 Last data filed at 11/16/2020 0700 Gross per 24 hour  Intake 1009.87 ml  Output 3250 ml  Net -2240.13 ml   Filed Weights   11/14/20 0500 11/15/20 0500 11/16/20 0500  Weight: 111.2 kg 110.1 kg 107.3 kg    Examination:  General:  Resting comfortably in bed HENT: NCAT OP clear PULM: CTA B, normal effort CV: RRR, no mgr GI: BS+, soft, nontender MSK: normal bulk and tone Neuro: awake, alert, no distress, MAEW  12/30 CXR> left lower lobe collapse  Resolved Hospital Problem list     Assessment & Plan:  Remains critically ill due to Acute on chronic hypoxemic and hypercarbic respiratory failure> improving but still high risk for intubation Proteus vulgaris HAP Acute exacerbation of COPD Tobacco abuse Mucus plugging/atelectasis Monitor off antibiotics Out of bed Flutter Incentive spirometry Brovana/pulmicort/yupelri Robitussin Keep NPO CXR prn Add hypertonic saline bid x2 days SLP evaluation Remain in ICU today, consider progressive care transfer  if continues to improve  Acute multifactorial encephalopathy> improving Lights on during daytime, off at night Frequent orientation Mobility as much as possible Minimize sedating meds seroquel bid troazodone at night  Chronic diastolic heart failure Hypertension Statin to continue  Lasix scheduled again  today, consider holding in AM Add back imdur and losartan  S/P ACDF s/p laminectomy C3-6 Post op care per NSGY  Hyperlipidemia statin  DM2 with hyperglycemia, uncontrolled Detemir, SSI  Hypothyroidism Synthroid  Anemia, normocytic, without bleeding Monitor for bleeding Transfuse PRBC for Hgb < 7 gm/dL   Best practice (evaluated daily)  Diet: tube feeding Pain/Anxiety/Delirium protocol (if indicated): n/a VAP protocol (if indicated): n/a DVT prophylaxis: sub q heparin GI prophylaxis: n/a Glucose control: as above Mobility: as above Disposition:remain in ICU  Goals of Care:   Per NSGY  Labs   CBC: Recent Labs  Lab 11/11/20 0436 11/12/20 0525 11/14/20 0504 11/15/20 0526 11/16/20 0355  WBC 14.7* 15.3* 16.0* 20.6* 17.5*  HGB 8.8* 9.0* 8.6* 9.9* 10.0*  HCT 28.7* 28.3* 27.6* 31.4* 30.2*  MCV 98.0 96.6 95.8 95.7 94.1  PLT 256 306 368 430* 430*    Basic Metabolic Panel: Recent Labs  Lab 11/10/20 0541 11/11/20 0436 11/12/20 0525 11/14/20 0504 11/15/20 0526 11/16/20 0355  NA 144 140 138 140 140 138  K 3.9 4.8 4.5 3.0* 3.6 4.7  CL 108 107 104 100 100 98  CO2 26 25 27 30 31 27   GLUCOSE 209* 211* 204* 174* 83 230*  BUN 39* 37* 47* 55* 43* 61*  CREATININE 0.76 0.68 0.80 0.81 0.80 1.00  CALCIUM 8.5* 8.8* 9.0 8.9 9.5 9.1  MG 2.4  --   --   --  2.4 2.5*  PHOS 2.8  --   --   --   --  4.1   GFR: Estimated Creatinine Clearance: 82 mL/min (by C-G formula based on SCr of 1 mg/dL). Recent Labs  Lab 11/12/20 0525 11/13/20 1133 11/14/20 0504 11/15/20 0526 11/16/20 0355  PROCALCITON  --  <0.10 <0.10 <0.10  --   WBC 15.3*  --  16.0* 20.6* 17.5*    Liver Function Tests: Recent Labs  Lab 11/11/20 0436 11/12/20 0525  AST 95* 109*  ALT 152* 162*  ALKPHOS 207* 205*  BILITOT 0.8 0.4  PROT 5.9* 5.9*  ALBUMIN 1.6* 1.7*   No results for input(s): LIPASE, AMYLASE in the last 168 hours. No results for input(s): AMMONIA in the last 168 hours.  ABG     Component Value Date/Time   PHART 7.363 11/08/2020 1303   PCO2ART 56.5 (H) 11/08/2020 1303   PO2ART 205 (H) 11/08/2020 1303   HCO3 32.0 (H) 11/08/2020 1303   TCO2 34 (H) 11/08/2020 1303   O2SAT 100.0 11/08/2020 1303     Coagulation Profile: No results for input(s): INR, PROTIME in the last 168 hours.  Cardiac Enzymes: No results for input(s): CKTOTAL, CKMB, CKMBINDEX, TROPONINI in the last 168 hours.  HbA1C: Hgb A1c MFr Bld  Date/Time Value Ref Range Status  10/26/2020 06:35 PM 6.9 (H) 4.8 - 5.6 % Final    Comment:    (NOTE) Pre diabetes:          5.7%-6.4%  Diabetes:              >6.4%  Glycemic control for   <7.0% adults with diabetes     CBG: Recent Labs  Lab 11/15/20 1157 11/15/20 1625 11/15/20 1928 11/15/20 2116 11/16/20 0723  GLUCAP 96 156* 191* 161* 251*  Critical care time: 35 minutes     Roselie Awkward, MD [facility] Pager: [phone] Cell: [phone] If no response, call [phone]

## 2020-11-16 NOTE — Progress Notes (Signed)
Inpatient Rehabilitation-Admissions Coordinator   CIR consult received. Met with pt bedside for rehab assessment. Pt notably confused and believed we were at his house. Very little information obtained and question the accuracy of it. As pt is currently a poor historian, left voicemail for his sister [name] to discuss CIR program and determine support.   Await call back from his family. Will continue to follow.   Raechel Ache, OTR/L  Rehab Admissions Coordinator  [phone] 11/16/2020 4:20 PM

## 2020-11-17 DIAGNOSIS — J9602 Acute respiratory failure with hypercapnia: Secondary | ICD-10-CM | POA: Diagnosis not present

## 2020-11-17 DIAGNOSIS — J441 Chronic obstructive pulmonary disease with (acute) exacerbation: Secondary | ICD-10-CM | POA: Diagnosis not present

## 2020-11-17 DIAGNOSIS — J9601 Acute respiratory failure with hypoxia: Secondary | ICD-10-CM | POA: Diagnosis not present

## 2020-11-17 DIAGNOSIS — M5412 Radiculopathy, cervical region: Secondary | ICD-10-CM | POA: Diagnosis not present

## 2020-11-17 LAB — COMPREHENSIVE METABOLIC PANEL
ALT: 84 U/L — ABNORMAL HIGH (ref 0–44)
ALT: 79 U/L — ABNORMAL HIGH (ref 0–44)
AST: 35 U/L (ref 15–41)
AST: 35 U/L (ref 15–41)
Albumin: 2.2 g/dL — ABNORMAL LOW (ref 3.5–5.0)
Albumin: 2.3 g/dL — ABNORMAL LOW (ref 3.5–5.0)
Alkaline Phosphatase: 168 U/L — ABNORMAL HIGH (ref 38–126)
Alkaline Phosphatase: 160 U/L — ABNORMAL HIGH (ref 38–126)
Anion gap: 12 (ref 5–15)
Anion gap: 10 (ref 5–15)
BUN: 63 mg/dL — ABNORMAL HIGH (ref 8–23)
BUN: 77 mg/dL — ABNORMAL HIGH (ref 8–23)
CO2: 29 mmol/L (ref 22–32)
CO2: 32 mmol/L (ref 22–32)
Calcium: 9.1 mg/dL (ref 8.9–10.3)
Calcium: 9.5 mg/dL (ref 8.9–10.3)
Chloride: 101 mmol/L (ref 98–111)
Chloride: 103 mmol/L (ref 98–111)
Creatinine, Ser: 0.93 mg/dL (ref 0.61–1.24)
Creatinine, Ser: 1.28 mg/dL — ABNORMAL HIGH (ref 0.61–1.24)
GFR, Estimated: >60 mL/min (ref >60)
GFR, Estimated: 58 mL/min — ABNORMAL LOW (ref >60)
Glucose, Bld: 196 mg/dL — ABNORMAL HIGH (ref 70–99)
Glucose, Bld: 149 mg/dL — ABNORMAL HIGH (ref 70–99)
Potassium: 3.5 mmol/L (ref 3.5–5.1)
Potassium: 4.4 mmol/L (ref 3.5–5.1)
Sodium: 142 mmol/L (ref 135–145)
Sodium: 145 mmol/L (ref 135–145)
Total Bilirubin: 0.8 mg/dL (ref 0.3–1.2)
Total Bilirubin: 0.9 mg/dL (ref 0.3–1.2)
Total Protein: 5.7 g/dL — ABNORMAL LOW (ref 6.5–8.1)
Total Protein: 6 g/dL — ABNORMAL LOW (ref 6.5–8.1)

## 2020-11-17 LAB — GLUCOSE, CAPILLARY
Glucose-Capillary: 134 mg/dL — ABNORMAL HIGH (ref 70–99)
Glucose-Capillary: 155 mg/dL — ABNORMAL HIGH (ref 70–99)
Glucose-Capillary: 163 mg/dL — ABNORMAL HIGH (ref 70–99)
Glucose-Capillary: 177 mg/dL — ABNORMAL HIGH (ref 70–99)
Glucose-Capillary: 184 mg/dL — ABNORMAL HIGH (ref 70–99)
Glucose-Capillary: 192 mg/dL — ABNORMAL HIGH (ref 70–99)

## 2020-11-17 LAB — CBC
HCT: 33.4 % — ABNORMAL LOW (ref 39.0–52.0)
Hemoglobin: 10.4 g/dL — ABNORMAL LOW (ref 13.0–17.0)
MCH: 30.1 pg (ref 26.0–34.0)
MCHC: 31.1 g/dL (ref 30.0–36.0)
MCV: 96.8 fL (ref 80.0–100.0)
Platelets: 467 10*3/uL — ABNORMAL HIGH (ref 150–400)
RBC: 3.45 MIL/uL — ABNORMAL LOW (ref 4.22–5.81)
RDW: 15.2 % (ref 11.5–15.5)
WBC: 17.8 10*3/uL — ABNORMAL HIGH (ref 4.0–10.5)
nRBC: 0 % (ref 0.0–0.2)

## 2020-11-17 MED ORDER — POTASSIUM CHLORIDE 20 MEQ PO PACK
40.0000 meq | PACK | Freq: Once | ORAL | Status: AC
  Administered 2020-11-17: 40 meq
  Filled 2020-11-17: qty 2

## 2020-11-17 MED ORDER — ISOSORBIDE DINITRATE 10 MG PO TABS
10.0000 mg | ORAL_TABLET | Freq: Three times a day (TID) | ORAL | Status: DC
  Administered 2020-11-17 – 2020-11-21 (×6): 10 mg
  Filled 2020-11-17 (×20): qty 1

## 2020-11-17 MED ORDER — LOSARTAN POTASSIUM 50 MG PO TABS
50.0000 mg | ORAL_TABLET | Freq: Every day | ORAL | Status: DC
  Administered 2020-11-17: 50 mg

## 2020-11-17 NOTE — Progress Notes (Signed)
NAME:  [name] [name], MRN:  [id number], DOB:  [birth year], LOS: 86 ADMISSION DATE:  10/26/2020, CONSULTATION DATE:  12/12 REFERRING MD:  Ronnald Ramp, CHIEF COMPLAINT:  Left arm weakness   Brief History:  75 year old M with PMH of OSA on NIV at home, cervical stenosis admitted 12/10 for ACDF.  He developed LUE weakness post-op requiring posterior cervical laminectomy, hospital course complicated by respiratory failure, PNA, and mucous plugging.   Past Medical History:  Cervical stenosis Chronic diastolic heart failure OSA COPD Neuropathy  Significant Hospital Events:  12/10 ACDF C3/4 C5/6 12/11 Posterior cervical decompression C3-6 12/12 PCCM consulted for post-op hypoxemia 12/13 Suspected aspiration, treated with unasyn (12/12-12/17) 12/17 Respiratory decompensation with hypoxemia, now on BiPAP 12/21 On BiPAP - FiO2 down to 50%, more awake/interactive. Mucus plugging on left, fever 102.2. Rocephin.   12/22 Febrile up to 101.8 overnight, Off BiPAP to [place], wound to bridge of nose, abx changed to cefepime 12/23 Intubated for worsening hypoxia delirium 12/25 Added systemic steroids for worsening bronchospasm, added IV fentanyl drip, gave IV Lasix 12/26 Ventilator mechanics improved. Fentanyl stopped, lasix increased, PEEP decreased  12/28 PSV wean but not extubated due to secretions, lasix  12/30 NTS suctioning required 12/31 awake, wants to get out of bed, wants to eat, MBS silent aspiration 1/1 cough stronger  Consults:  PCCM  Procedures:  Cortrak 12/13 >> ETT 12/23 > 12/29 RUE PICC 12/23 >>    Significant Diagnostic Tests:   MRI 12/11 > cord signal change C3-4 C5-6  CXR 12/12 > Left atelectasis  CXR 12/13 > increasing L opacity - effusion vs obstructive atelectasis   CXR 12/16 > left lower lobe atelectasis, small effusion  CXR 12/21 > mucus plugging on Lt with volume loss  MBS 12/31 > silent aspiration   Micro Data:  SARS Covid 12/11 > neg Trach asp 12/19 > proteus  vulgaris (resistant to ampicillin/ cefazolin) Trach asp 12/23 > candida albicans  Antimicrobials:  Unasyn 12/12 > 12/17 Rocephin 12/19 > 12/22 Cefepime 12/22 > 12/28 Diflucan 12/28 > 12/29   Interim History / Subjective:    MBS silent aspiration, can have small volume nectar thick Cough stronger today Was out of bed yesterday  Objective   Blood pressure 117/62, pulse (!) 57, temperature 98.3 F (36.8 C), temperature source Oral, resp. rate 18, height 6\' 2"  (579FGE m), weight 106.9 kg, SpO2 95 %.        Intake/Output Summary (Last 24 hours) at 11/17/2020 0804 Last data filed at 11/17/2020 0600 Gross per 24 hour  Intake 1210.9 ml  Output 2950 ml  Net -1739.1 ml   Filed Weights   11/15/20 0500 11/16/20 0500 11/17/20 0332  Weight: 110.1 kg 107.3 kg 106.9 kg    Examination:  General:  Resting comfortably in bed HENT: NCAT OP clear PULM: CTA B, normal effort CV: RRR, no mgr GI: BS+, soft, nontender MSK: normal bulk and tone Neuro: awake, alert, no distress, MAEW   12/30 CXR> left lower lobe collapse  Resolved Hospital Problem list     Assessment & Plan:  Acute on chronic hypoxemic and hypercarbic respiratory failure> improving as of 1/1 Proteus vulgaris HAP Acute exacerbation of COPD > resolved Tobacco abuse Mucus plugging/atelectasis Aspiration risk/dysphagia  Continue to monitor off antibiotics Out of bed Flutter Incentive spirometry Brovana/pulmicort/yupelri Robitussin  Small volume nectar thick liquids  Continue hypertonic saline today Out of ICU today  Acute multifactorial encephalopathy> improving Lights on during daytime, off at night Frequent orientation Mobility as much  as possible seroquel and trazodone to continue Minimize sedating medications  Chronic diastolic heart failure Hypertension Hyperlipidemia Continue imdur, losartan continue statin Lasix again today then hold  S/P ACDF s/p laminectomy C3-6 Post op management per NSGY  DM2  with hyperglycemia, uncontrolled Detemir, SSI  Hypothyroidism Synthroid  Anemia, normocytic, without bleeding Monitor for bleeding Transfuse PRBC for Hgb < 7 gm/dL  Hypokalemia replace   Best practice (evaluated daily)  Diet: tube feeding Pain/Anxiety/Delirium protocol (if indicated): n/a VAP protocol (if indicated): n/a DVT prophylaxis: sub q heparin GI prophylaxis: n/a Glucose control: as above Mobility: as above Disposition: to progressive, will ask TRH to follow  Goals of Care:   Per NSGY  Labs   CBC: Recent Labs  Lab 11/11/20 0436 11/12/20 0525 11/14/20 0504 11/15/20 0526 11/16/20 0355  WBC 14.7* 15.3* 16.0* 20.6* 17.5*  HGB 8.8* 9.0* 8.6* 9.9* 10.0*  HCT 28.7* 28.3* 27.6* 31.4* 30.2*  MCV 98.0 96.6 95.8 95.7 94.1  PLT 256 306 368 430* 430*    Basic Metabolic Panel: Recent Labs  Lab 11/12/20 0525 11/14/20 0504 11/15/20 0526 11/16/20 0355 11/17/20 0300  NA 138 140 140 138 142  K 4.5 3.0* 3.6 4.7 3.5  CL 104 100 100 98 101  CO2 27 30 31 27 29   GLUCOSE 204* 174* 83 230* 196*  BUN 47* 55* 43* 61* 63*  CREATININE 0.80 0.81 0.80 1.00 0.93  CALCIUM 9.0 8.9 9.5 9.1 9.1  MG  --   --  2.4 2.5*  --   PHOS  --   --   --  4.1  --    GFR: Estimated Creatinine Clearance: 88 mL/min (by C-G formula based on SCr of 0.93 mg/dL). Recent Labs  Lab 11/12/20 0525 11/13/20 1133 11/14/20 0504 11/15/20 0526 11/16/20 0355  PROCALCITON  --  <0.10 <0.10 <0.10  --   WBC 15.3*  --  16.0* 20.6* 17.5*    Liver Function Tests: Recent Labs  Lab 11/11/20 0436 11/12/20 0525 11/17/20 0300  AST 95* 109* 35  ALT 152* 162* 84*  ALKPHOS 207* 205* 168*  BILITOT 0.8 0.4 0.8  PROT 5.9* 5.9* 5.7*  ALBUMIN 1.6* 1.7* 2.2*   No results for input(s): LIPASE, AMYLASE in the last 168 hours. No results for input(s): AMMONIA in the last 168 hours.  ABG    Component Value Date/Time   PHART 7.363 11/08/2020 1303   PCO2ART 56.5 (H) 11/08/2020 1303   PO2ART 205 (H)  11/08/2020 1303   HCO3 32.0 (H) 11/08/2020 1303   TCO2 34 (H) 11/08/2020 1303   O2SAT 100.0 11/08/2020 1303     Coagulation Profile: No results for input(s): INR, PROTIME in the last 168 hours.  Cardiac Enzymes: No results for input(s): CKTOTAL, CKMB, CKMBINDEX, TROPONINI in the last 168 hours.  HbA1C: Hgb A1c MFr Bld  Date/Time Value Ref Range Status  10/26/2020 06:35 PM 6.9 (H) 4.8 - 5.6 % Final    Comment:    (NOTE) Pre diabetes:          5.7%-6.4%  Diabetes:              >6.4%  Glycemic control for   <7.0% adults with diabetes     CBG: Recent Labs  Lab 11/16/20 1109 11/16/20 1640 11/16/20 1933 11/16/20 2326 11/17/20 0325  GLUCAP 148* 139* 146* 155* 184*     Critical care time: n/a minutes     01/15/21, MD [place] P[facility]M Pager: [phone] Cell: [phone] If no response, call [phone]

## 2020-11-17 NOTE — Progress Notes (Signed)
Pt tolerating well on 5L [place] at this time. Bipap held due to secretions and wound on bridge of nose. RT will continue to monitor

## 2020-11-17 NOTE — Progress Notes (Signed)
Patient remains on 4L [place]. CPT via bed and flutter done. Wet, congested weak cough. Small amount of thick [place] secretions expectorated.  Patient has wound of bridge of nose. Discussed with RN use of BIPAP for HS use. In agreement to monitor patient and hold off BIPAP due to wound and secretions. If needed HHFNC can be initiated.

## 2020-11-17 NOTE — Progress Notes (Signed)
Subjective: Patient reports feeling much better  Objective: Vital signs in last 24 hours: Temp:  [97.7 F (36.5 C)-99 F (37.2 C)] 98.3 F (36.8 C) (01/01 0400) Pulse Rate:  [50-62] 57 (01/01 0600) Resp:  [12-20] 18 (01/01 0600) BP: (103-123)/(47-102) 117/62 (01/01 0600) SpO2:  [86 %-100 %] 95 % (01/01 0807) Weight:  [106.9 kg] 106.9 kg (01/01 0332)  Intake/Output from previous day: 12/31 0701 - 01/01 0700 In: 1280.9 [I.V.:200.9; NG/GT:1080] Out: 3550 [Urine:3550] Intake/Output this shift: No intake/output data recorded.  Physical Exam: Dressings CDI.  Improving bilateral upper extremity strength.  Wet cough.  Receiving tube feeds.  Lab Results: Recent Labs    11/15/20 0526 11/16/20 0355  WBC 20.6* 17.5*  HGB 9.9* 10.0*  HCT 31.4* 30.2*  PLT 430* 430*   BMET Recent Labs    11/16/20 0355 11/17/20 0300  NA 138 142  K 4.7 3.5  CL 98 101  CO2 27 29  GLUCOSE 230* 196*  BUN 61* 63*  CREATININE 1.00 0.93  CALCIUM 9.1 9.1    Studies/Results: DG Swallowing Func-Speech Pathology  Result Date: 11/16/2020 Objective Swallowing Evaluation: Type of Study: MBS-Modified Barium Swallow Study  Patient Details Name: ODEAN FESTER MRN: [id number] Date of Birth: 01-Nov-1946 Today's Date: 11/16/2020 Time: SLP Start Time (ACUTE ONLY): 1018 -SLP Stop Time (ACUTE ONLY): 1040 SLP Time Calculation (min) (ACUTE ONLY): 22 min Past Medical History: Past Medical History: Diagnosis Date . Anxiety  . Anxiety disorder 05/05/2014  unspecifed . Cancer [facility])   prostate cancer  . CHF (congestive heart failure) (HCC)  . Colonic polyp  . COPD (chronic obstructive pulmonary disease) (HCC)  . Depression  . Dyspnea  . Elevated prostate specific antigen (PSA)  . Essential (primary) hypertension 05/05/2014 . GI bleed   due to diverticulitis  . Heart murmur   hx of 1960s . History of thyroid cancer 05/05/2014 . Hypercholesteremia  . Hyperlipidemia  . Hypertension  . Hypothyroidism 05/05/2014 . Impotence of  organic origin  . Male erectile dysfunction 12/29/2012 . Malignant neoplasm of prostate (HCC)   10/30/2011 T1c - Identified by needle biopsy . Gleason 7 (3+4) 5 of 12 cores, Bilateral  . Prediabetes 06/22/2017 . Prostate cancer (HCC)  . Shortness of breath on exertion 07/11/2015 . Sleep apnea   CPAP , report on chart , not used in last 2 weeks  . Thyroid cancer (HCC)  . Thyroid disease  . Urinary obstruction   not elsewhere classified Past Surgical History: Past Surgical History: Procedure Laterality Date . BACK SURGERY    lumbar 1987  . BACK SURGERY    C-6- Plate&Pin . CATARACT EXTRACTION W/PHACO Right 09/15/2018  Procedure: CATARACT EXTRACTION PHACO AND INTRAOCULAR LENS PLACEMENT (IOC) RIGHT;  Surgeon: Lockie Mola, MD;  Location: [facility];  Service: Ophthalmology;  Laterality: Right;  Requests to be last case . CATARACT EXTRACTION W/PHACO Left 10/06/2018  Procedure: CATARACT EXTRACTION PHACO AND INTRAOCULAR LENS PLACEMENT (IOC)  LEFT;  Surgeon: Lockie Mola, MD;  Location: [facility];  Service: Ophthalmology;  Laterality: Left; . CERVICAL DISC SURGERY   . COLONOSCOPY    10/06/2011, 06/05/2006, 05/06/2002 adematous polyps: CBF 09/2014; Recall Ltr mailed 02/27/2015 (dw) . COLONOSCOPY WITH PROPOFOL N/A 07/06/2015  Procedure: COLONOSCOPY WITH PROPOFOL;  Surgeon: Scot Jun, MD;  Location: [facility];  Service: Endoscopy;  Laterality: N/A; . COLONOSCOPY WITH PROPOFOL N/A 09/01/2018  Procedure: COLONOSCOPY WITH PROPOFOL;  Surgeon: Scot Jun, MD;  Location: [facility] ENDOSCOPY;  Service: Endoscopy;  Laterality: N/A; . COLONOSCOPY WITH  PROPOFOL N/A 04/17/2020  Procedure: COLONOSCOPY WITH PROPOFOL;  Surgeon: Lucilla Lame, MD;  Location: [facility] ENDOSCOPY;  Service: Endoscopy;  Laterality: N/A; . ESOPHAGOGASTRODUODENOSCOPY (EGD) WITH PROPOFOL N/A 04/17/2020  Procedure: ESOPHAGOGASTRODUODENOSCOPY (EGD) WITH PROPOFOL;  Surgeon: Lucilla Lame, MD;  Location: [facility] ENDOSCOPY;  Service:  Endoscopy;  Laterality: N/A; . EYE SURGERY   . HERNIA REPAIR    umbilical hernia repair  . LAMINECTOMY FOR EXCISION / EVACUATION INTRASPINAL LESION    lumbar . OTHER SURGICAL HISTORY    right rotator cuff surgery  . OTHER SURGICAL HISTORY    bilateral tubes in ears  . POSTERIOR CERVICAL LAMINECTOMY N/A 10/27/2020  Procedure: POSTERIOR CERVICAL LAMINECTOMY CERVICAL THREE-CERVICAL SIX;  Surgeon: Judith Part, MD;  Location: [place];  Service: Neurosurgery;  Laterality: N/A; . POSTERIOR LAMINECTOMY / DECOMPRESSION CERVICAL SPINE   . PROSTATE BIOPSY    10/22/2011 Volume:55.8 cc's, PSA:4.5, Free PSA:12% . ROBOT ASSISTED LAPAROSCOPIC RADICAL PROSTATECTOMY  12/15/2011  Procedure: ROBOTIC ASSISTED LAPAROSCOPIC RADICAL PROSTATECTOMY LEVEL 2;  Surgeon: Dutch Gray, MD;  Location: [facility];  Service: Urology;  Laterality: N/A;   . ROTATOR CUFF REPAIR Right  . TONSILLECTOMY   . TOTAL THYROIDECTOMY   HPI: Pt is a 75 yo male s/p anterior cervical discetomy at C3/4 and C5/6 following removal of existing plate, admitted afterwards for sudden onset weakness and pain in his LUE. CT unremarkable; MRI pending. On 12/11 he was made NPO for difficulty swallowing. On 12/11 he underwent C3-6 posterior decompressive laminectomies during which he was intubated. PMH includes: thyroid ca, prostate ca, HTN, HLD, dyspnea, depression, COPD, CHF, anxiety. MBS 11/01/20 recommending Dys 1(puree) and pudding thick liqui. Following day had respiratory decline requiring Bipap until 12/23 when he was intubated and extubated 12/29. New order placed for swallow assessment (considered treat since already on caseload).  Subjective: pt confused, moaning in pain Assessment / Plan / Recommendation CHL IP CLINICAL IMPRESSIONS 11/16/2020 Clinical Impression Pt exhibits a mild-moderate pharyngoesophageal dysphagia marked by aspiration and esophgeal involvement. His laryngeal elevation and anterior hyoid movement/strength appears adequate as well as timing of  swallow onset. One instance of aspiration from thin barium falling from oral cavity and aspirating  before swallow onset occured(silent). His vallecular and pyriform sinus residue was mild and noted to have backflow from his UES. When esophagus scanned it revealed barium filling most of esophagus and never completely emptying despite thinner consistencies with thin and nectar. Therapist concerned that thicker consistencies will fill esophagus, reflux and possibly aspirating after the swallow. He reports having esophageal dilation previously and was having problems with esophagus prior to admission. Pt noted to cough during study when barium not obseved in vestibule or trachea. Therapist recommends more conservative approach with allowing nectar thick liquids twice a day from floor stock from [employer] only with full supervision and therapist can work with pt on increasing to full meal trays. He may need work up for esophagus after discharge or if symptoms continue SLP Visit Diagnosis Dysphagia, pharyngoesophageal phase (R13.14) Attention and concentration deficit following -- Frontal lobe and executive function deficit following -- Impact on safety and function Moderate aspiration risk   CHL IP TREATMENT RECOMMENDATION 11/16/2020 Treatment Recommendations Therapy as outlined in treatment plan below   Prognosis 11/16/2020 Prognosis for Safe Diet Advancement Good Barriers to Reach Goals -- Barriers/Prognosis Comment -- CHL IP DIET RECOMMENDATION 11/16/2020 SLP Diet Recommendations Other (Comment) Liquid Administration via Spoon Medication Administration Crushed with puree Compensations -- Postural Changes --   CHL IP OTHER RECOMMENDATIONS 11/16/2020 Recommended Consults --  Oral Care Recommendations Oral care QID Other Recommendations --   CHL IP FOLLOW UP RECOMMENDATIONS 11/16/2020 Follow up Recommendations Skilled Nursing facility   [facility] IP FREQUENCY AND DURATION 11/16/2020 Speech Therapy Frequency (ACUTE ONLY) min  2x/week Treatment Duration 2 weeks      CHL IP ORAL PHASE 11/16/2020 Oral Phase Impaired Oral - Pudding Teaspoon -- Oral - Pudding Cup -- Oral - Honey Teaspoon WFL Oral - Honey Cup -- Oral - Nectar Teaspoon WFL Oral - Nectar Cup WFL Oral - Nectar Straw -- Oral - Thin Teaspoon -- Oral - Thin Cup Decreased bolus cohesion Oral - Thin Straw -- Oral - Puree WFL Oral - Mech Soft -- Oral - Regular -- Oral - Multi-Consistency -- Oral - Pill -- Oral Phase - Comment --  CHL IP PHARYNGEAL PHASE 11/16/2020 Pharyngeal Phase Impaired Pharyngeal- Pudding Teaspoon -- Pharyngeal -- Pharyngeal- Pudding Cup -- Pharyngeal -- Pharyngeal- Honey Teaspoon Pharyngeal residue - valleculae Pharyngeal -- Pharyngeal- Honey Cup Pharyngeal residue - valleculae Pharyngeal -- Pharyngeal- Nectar Teaspoon WFL;Pharyngeal residue - valleculae Pharyngeal -- Pharyngeal- Nectar Cup Pharyngeal residue - valleculae Pharyngeal -- Pharyngeal- Nectar Straw -- Pharyngeal -- Pharyngeal- Thin Teaspoon -- Pharyngeal -- Pharyngeal- Thin Cup Penetration/Aspiration before swallow Pharyngeal Material enters airway, passes BELOW cords and not ejected out despite cough attempt by patient Pharyngeal- Thin Straw -- Pharyngeal -- Pharyngeal- Puree Pharyngeal residue - valleculae Pharyngeal -- Pharyngeal- Mechanical Soft -- Pharyngeal -- Pharyngeal- Regular -- Pharyngeal -- Pharyngeal- Multi-consistency -- Pharyngeal -- Pharyngeal- Pill -- Pharyngeal -- Pharyngeal Comment --  CHL IP CERVICAL ESOPHAGEAL PHASE 11/16/2020 Cervical Esophageal Phase Impaired Pudding Teaspoon -- Pudding Cup -- Honey Teaspoon -- Honey Cup -- Nectar Teaspoon -- Nectar Cup -- Nectar Straw -- Thin Teaspoon -- Thin Cup -- Thin Straw -- Puree -- Mechanical Soft -- Regular -- Multi-consistency -- Pill -- Cervical Esophageal Comment -- [place] Siren 11/16/2020, 2:21 PM Orbie Pyo Litaker M.Ed Actor Pager [phone] Office [phone]                Assessment/Plan: Patient is improving.  Awaiting Rehab.  Continue therapies.    LOS: 22 days    Peggyann Shoals, MD 11/17/2020, 8:29 AM

## 2020-11-17 NOTE — Progress Notes (Signed)
Upon arrival from ICU Megan,RN gave update from previous report about patient and vitals taken. bp in low 80s-90s systolic but stated patient had been started on Imdur. BP taken later prior to admission on monitor and bp in 100s. Lost bp validation once telemetry readmitted patient. RN reassess bp at 1830 and noted systolic bp in 60s-70s. Highest sbp 90s using thigh cuff. RRT and MD as well as CN notified. Patient denies pain or concerns. No change in LOC. New orders from Dr.Stern to contact ICU MD and Dr. Julien Girt ordered labs. All three MD aware of patient's condition and CHF history. No orders for IVF but only to monitor, collect labs, and hold bp meds. Report given to [facility]. Will continue to monitor.

## 2020-11-18 ENCOUNTER — Inpatient Hospital Stay (HOSPITAL_COMMUNITY): Payer: Medicare Other

## 2020-11-18 DIAGNOSIS — I5032 Chronic diastolic (congestive) heart failure: Secondary | ICD-10-CM

## 2020-11-18 DIAGNOSIS — E785 Hyperlipidemia, unspecified: Secondary | ICD-10-CM

## 2020-11-18 DIAGNOSIS — R1319 Other dysphagia: Secondary | ICD-10-CM | POA: Diagnosis not present

## 2020-11-18 DIAGNOSIS — R0902 Hypoxemia: Secondary | ICD-10-CM

## 2020-11-18 DIAGNOSIS — N179 Acute kidney failure, unspecified: Secondary | ICD-10-CM

## 2020-11-18 DIAGNOSIS — R799 Abnormal finding of blood chemistry, unspecified: Secondary | ICD-10-CM

## 2020-11-18 DIAGNOSIS — T17500A Unspecified foreign body in bronchus causing asphyxiation, initial encounter: Secondary | ICD-10-CM

## 2020-11-18 DIAGNOSIS — I10 Essential (primary) hypertension: Secondary | ICD-10-CM

## 2020-11-18 DIAGNOSIS — J9601 Acute respiratory failure with hypoxia: Secondary | ICD-10-CM | POA: Diagnosis not present

## 2020-11-18 LAB — GLUCOSE, CAPILLARY
Glucose-Capillary: 110 mg/dL — ABNORMAL HIGH (ref 70–99)
Glucose-Capillary: 133 mg/dL — ABNORMAL HIGH (ref 70–99)
Glucose-Capillary: 136 mg/dL — ABNORMAL HIGH (ref 70–99)
Glucose-Capillary: 159 mg/dL — ABNORMAL HIGH (ref 70–99)
Glucose-Capillary: 162 mg/dL — ABNORMAL HIGH (ref 70–99)
Glucose-Capillary: 163 mg/dL — ABNORMAL HIGH (ref 70–99)

## 2020-11-18 LAB — BASIC METABOLIC PANEL
Anion gap: 10 (ref 5–15)
BUN: 89 mg/dL — ABNORMAL HIGH (ref 8–23)
CO2: 31 mmol/L (ref 22–32)
Calcium: 9.6 mg/dL (ref 8.9–10.3)
Chloride: 104 mmol/L (ref 98–111)
Creatinine, Ser: 1.35 mg/dL — ABNORMAL HIGH (ref 0.61–1.24)
GFR, Estimated: 54 mL/min — ABNORMAL LOW (ref >60)
Glucose, Bld: 192 mg/dL — ABNORMAL HIGH (ref 70–99)
Potassium: 4.2 mmol/L (ref 3.5–5.1)
Sodium: 145 mmol/L (ref 135–145)

## 2020-11-18 LAB — URINALYSIS, ROUTINE W REFLEX MICROSCOPIC
Bilirubin Urine: NEGATIVE
Glucose, UA: NEGATIVE mg/dL
Hgb urine dipstick: NEGATIVE
Ketones, ur: NEGATIVE mg/dL
Leukocytes,Ua: NEGATIVE
Nitrite: NEGATIVE
Protein, ur: NEGATIVE mg/dL
Specific Gravity, Urine: 1.019 (ref 1.005–1.030)
pH: 5 (ref 5.0–8.0)

## 2020-11-18 MED ORDER — SODIUM CHLORIDE 0.9 % IV SOLN: INTRAVENOUS | Status: AC

## 2020-11-18 MED ORDER — INSULIN ASPART 100 UNIT/ML ~~LOC~~ SOLN
0.0000 [IU] | SUBCUTANEOUS | Status: DC
  Administered 2020-11-18: 4 [IU] via SUBCUTANEOUS
  Administered 2020-11-18: 3 [IU] via SUBCUTANEOUS
  Administered 2020-11-18 (×3): 4 [IU] via SUBCUTANEOUS
  Administered 2020-11-19 (×2): 3 [IU] via SUBCUTANEOUS
  Administered 2020-11-19: 4 [IU] via SUBCUTANEOUS
  Administered 2020-11-19 – 2020-11-20 (×4): 3 [IU] via SUBCUTANEOUS
  Administered 2020-11-21: 4 [IU] via SUBCUTANEOUS
  Administered 2020-11-21 – 2020-11-23 (×5): 3 [IU] via SUBCUTANEOUS
  Administered 2020-11-25: 7 [IU] via SUBCUTANEOUS
  Administered 2020-11-27 – 2020-11-28 (×2): 3 [IU] via SUBCUTANEOUS

## 2020-11-18 NOTE — Progress Notes (Signed)
[facility]  PROGRESS NOTE  [name] [name] [id number] DOB: [birth year] DOA: 10/26/2020 PCP: Idelle Crouch, MD Admit date - 10/26/2020   Admitting Physician Ashok Pall, MD  Outpatient Primary MD for the patient is Idelle Crouch, MD  LOS - 23 Brief Narrative   [name] [name] is a 75 y.o. year old male with medical history significant for OSA on NIV at home, cervical stenosis admitted on 12/10 for ACDF.  Hospital course was complicated by postoperative left extremity weakness requiring posterior cervical laminectomy on 12/11, postoperative hypoxemia complicated by aspiration pneumonia (treated with Unasyn 12/12-12/17,, mucous plugging requiring BiPAP, systemic steroids worsening bronchospasms and frequent suctioning while awaiting improvement in cough strength with assistance from PCCM was transferred to progressive unit under care of neurosurgical services with [facility] asked to consult for medical comorbidities on 11/18/2020.   Subjective  Feels his cough continues to improve has been able to cough up phlegm.  Denies any chest pain.  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  Completed antibiotic course for Proteus vulgaris pneumonia.  Still high aspiration risk given dysphagia and hospital-acquired delirium further complicated by mucous plugging and atelectasis. Currently on 6 L  -Supportive care with flutter valve, incentive spirometry, scheduled inhalers -Small-volume nectar thick liquids with assistance -Chest physiotherapy, suctioning, continue O2 goal SPO2 greater than 88  Nonoliguric AKI.  Suspect prerenal in the setting of some recent low BP (nadir of 87/52 on 1/1) with hardly any p.o. intake though patient is receiving tube feeds complicated by IV lasix use for his CHF as well as home losartan  Baseline creatinine 0.8-1.1, currently 1.35 with a BUN of 89.  Normal  bladder with no hydronephrosis on renal ultrasound suggesting medical renal disease based off echogenicity.  UA unremarkable -Trial IV fluids, hold on further diuresis, repeat BMP in a.m. if continues to worsen in particular BUN may warrant renal consultation -Discontinue home losartan on 1/2 -Monitor output Via Foley  Encephalopathy, most likely delirium hospital-acquired, improving. Alert and oriented to self, place, context this evening -Delirium precautions including frequent orientation, minimizing sedating medications -Scheduled Seroquel and trazodone, Prozac  Hypothyroidism, stable -continue Synthroid  Chronic diastolic CHF. Actually looks volume down to me on exam with dry oral mucosa, no peripheral edema or crackles noted. Status post IV Lasix on 1/1 -daily weights, strict I/os -hold on further IV diuresis, trial of IVF  HTN.  Currently normotensive.  Did have nadir of 80s over 50s on 1/1.   -hold home losartan given AKI -Verapamil not ordered on admission  HLD, stable -Continue statin  Type 2 diabetes, A1c 6.9.  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  Status post modified barium swallow evaluation by speech.  Patient reports having prior issues with history of esophageal dilation in the past -Full assist for feeding, nectar thick liquids twice a day -Receiving tube feeds via core track -May warrant further evaluation with EGD if symptoms persist  OSA -Nightly BiPAP  Leukocytosis, stable.  Currently 17.8, remains afebrile -Monitor CBC  Chronic normocytic anemia, stable.  Hemoglobin stable at baseline. -Monitor CBC  Status post ACDF status post laminectomy -Postoperative management per primary (NSG)      MDM: The below labs and  imaging reports were reviewed  and summarized above.  Medication management as above.  Lab Results  Component Value Date   PLT 467 (H) 11/17/2020    Diet :  Diet Order            Diet NPO time specified  Diet effective now                  Inpatient Medications Scheduled Meds: . arformoterol  15 mcg Nebulization BID  . atorvastatin  40 mg Per Tube Daily  . chlorhexidine  15 mL Mouth Rinse BID  . Chlorhexidine Gluconate Cloth  6 each Topical Daily  . cholecalciferol  1,000 Units Per Tube Daily  . collagenase   Topical Daily  . docusate  100 mg Per Tube BID  . feeding supplement (PROSource TF)  90 mL Per Tube BID  . FLUoxetine  40 mg Per Tube Daily  . gabapentin  300 mg Per Tube Q8H  . guaiFENesin  15 mL Per Tube Q4H  . heparin injection (subcutaneous)  5,000 Units Subcutaneous Q8H  . insulin aspart  0-20 Units Subcutaneous Q4H  . insulin aspart  6 Units Subcutaneous TID WC  . insulin detemir  30 Units Subcutaneous BID  . isosorbide dinitrate  10 mg Per Tube TID  . levothyroxine  175 mcg Per Tube QAC breakfast  . mouth rinse  15 mL Mouth Rinse q12n4p  . multivitamin with minerals  1 tablet Per Tube Daily  . polyethylene glycol  17 g Per Tube Daily  . QUEtiapine  50 mg Per Tube BID  . revefenacin  175 mcg Nebulization Daily  . sodium chloride flush  10-40 mL Intracatheter Q12H  . traZODone  100 mg Per Tube QHS   Continuous Infusions: . sodium chloride 10 mL/hr at 11/17/20 0600  . feeding supplement (PIVOT 1.5 CAL) 1,000 mL (11/18/20 1713)   PRN Meds:.acetaminophen (TYLENOL) oral liquid 160 mg/5 mL, albuterol, bisacodyl, Gerhardt's butt cream, labetalol, oxyCODONE, sodium chloride flush  Antibiotics  :   Anti-infectives (From admission, onward)   Start     Dose/Rate Route Frequency Ordered Stop   11/13/20 1330  fluconazole (DIFLUCAN) IVPB 400 mg  Status:  Discontinued        400 mg 100 mL/hr over 120 Minutes Intravenous Every 24 hours 11/13/20 1230 11/14/20 1626    11/07/20 1700  ceFEPIme (MAXIPIME) 2 g in sodium chloride 0.9 % 100 mL IVPB        2 g 200 mL/hr over 30 Minutes Intravenous Every 8 hours 11/07/20 0935 11/13/20 1843   11/04/20 1000  cefTRIAXone (ROCEPHIN) 2 g in sodium chloride 0.9 % 100 mL IVPB  Status:  Discontinued        2 g 200 mL/hr over 30 Minutes Intravenous Every 24 hours 11/04/20 0903 11/07/20 0933   10/28/20 2100  Ampicillin-Sulbactam (UNASYN) 3 g in sodium chloride 0.9 % 100 mL IVPB  Status:  Discontinued        3 g 200 mL/hr over 30 Minutes Intravenous Every 6 hours 10/28/20 1917 11/02/20 1019   10/27/20 1707  ceFAZolin (ANCEF) 2-4 GM/100ML-% IVPB       Note to Pharmacy: Shireen Quan   : cabinet override      10/27/20 1707 10/28/20 0514   10/27/20 1700  ceFAZolin (ANCEF) IVPB 2g/100 mL premix  Status:  Discontinued        2 g 200 mL/hr over 30 Minutes Intravenous  Once 10/27/20 1658 10/27/20 2224       Objective  Vitals:   11/18/20 0817 11/18/20 1100 11/18/20 1140 11/18/20 1552  BP:    135/75  Pulse:      Resp:      Temp:   97.7 F (36.5 C) 98.2 F (36.8 C)  TempSrc:   Axillary Axillary  SpO2: 94% 96%  96%  Weight:      Height:        SpO2: 96 % O2 Flow Rate (L/min): 6 L/min FiO2 (%): 40 %  Wt Readings from Last 3 Encounters:  11/18/20 111.3 kg  10/16/20 114.2 kg  04/17/20 120.2 kg     Intake/Output Summary (Last 24 hours) at 11/18/2020 1817 Last data filed at 11/18/2020 1129 Gross per 24 hour  Intake 830 ml  Output 950 ml  Net -120 ml    Physical Exam:     Awake Alert, Oriented X 3, Normal affect No new F.N deficits,  Surgical incision clean, dry, intact on anterior neck  NGT tube in place .AT, Normal respiratory effort on 6 L, rhonchi no wheezing or crackles, CTAB RRR,No Gallops,Rubs or new Murmurs,  +ve B.Sounds, Abd Soft, No tenderness, No rebound, guarding or rigidity. No Cyanosis, No new Rash or bruise   I have personally reviewed the following:   Data Reviewed:  CBC Recent  Labs  Lab 11/12/20 0525 11/14/20 0504 11/15/20 0526 11/16/20 0355 11/17/20 1920  WBC 15.3* 16.0* 20.6* 17.5* 17.8*  HGB 9.0* 8.6* 9.9* 10.0* 10.4*  HCT 28.3* 27.6* 31.4* 30.2* 33.4*  PLT 306 368 430* 430* 467*  MCV 96.6 95.8 95.7 94.1 96.8  MCH 30.7 29.9 30.2 31.2 30.1  MCHC 31.8 31.2 31.5 33.1 31.1  RDW 13.7 13.6 13.9 14.5 15.2    Chemistries  Recent Labs  Lab 11/12/20 0525 11/14/20 0504 11/15/20 0526 11/16/20 0355 11/17/20 0300 11/17/20 1920 11/18/20 0116  NA 138   < > 140 138 142 145 145  K 4.5   < > 3.6 4.7 3.5 4.4 4.2  CL 104   < > 100 98 101 103 104  CO2 27   < > 31 27 29  32 31  GLUCOSE 204*   < > 83 230* 196* 149* 192*  BUN 47*   < > 43* 61* 63* 77* 89*  CREATININE 0.80   < > 0.80 1.00 0.93 1.28* 1.35*  CALCIUM 9.0   < > 9.5 9.1 9.1 9.5 9.6  MG  --   --  2.4 2.5*  --   --   --   AST 109*  --   --   --  35 35  --   ALT 162*  --   --   --  84* 79*  --   ALKPHOS 205*  --   --   --  168* 160*  --   BILITOT 0.4  --   --   --  0.8 0.9  --    < > = values in this interval not displayed.   ------------------------------------------------------------------------------------------------------------------ No results for input(s): CHOL, HDL, LDLCALC, TRIG, CHOLHDL, LDLDIRECT in the last 72 hours.  Lab Results  Component Value Date   HGBA1C 6.9 (H) 10/26/2020   ------------------------------------------------------------------------------------------------------------------ No results for input(s): TSH, T4TOTAL, T3FREE, THYROIDAB in the last 72 hours.  Invalid input(s): FREET3 ------------------------------------------------------------------------------------------------------------------ No results for input(s): VITAMINB12, FOLATE, FERRITIN, TIBC, IRON, RETICCTPCT in the last 72 hours.  Coagulation profile No results for input(s): INR, PROTIME in the last 168 hours.  No results for input(s): DDIMER in the last 72 hours.  Cardiac Enzymes No results for  input(s): CKMB, TROPONINI, MYOGLOBIN in the last 168 hours.  Invalid input(s): CK ------------------------------------------------------------------------------------------------------------------    Component Value Date/Time   BNP 113.6 (H) 11/15/2020 0526    Micro Results Recent Results (from the past 240 hour(s))  Culture, respiratory (non-expectorated)     Status: None   Collection Time: 11/13/20  4:19 AM   Specimen: Tracheal Aspirate; Respiratory  Result Value Ref Range Status   Specimen Description TRACHEAL ASPIRATE  Final   Special Requests NONE  Final   Gram Stain   Final    FEW WBC PRESENT, PREDOMINANTLY PMN NO ORGANISMS SEEN    Culture   Final    Consistent with normal respiratory flora. Performed at [facility], [place] [address], [place], [place] [address]    Report Status 11/15/2020 FINAL  Final    Radiology Reports DG Cervical Spine 2-3 Views  Result Date: 10/27/2020 CLINICAL DATA:  C3-C6 laminectomy EXAM: CERVICAL SPINE - 2-3 VIEW; DG C-ARM 1-60 MIN COMPARISON:  10/26/2020 intraoperative images FINDINGS: Changes of multilevel anterior fusion. Posterior surgical instruments are in place overlying the C3 and C4 spinous processes. IMPRESSION: Intraoperative localization as above. Electronically Signed   By: Rolm Baptise M.D.   On: 10/27/2020 20:49   CT CERVICAL SPINE WO CONTRAST  Result Date: 10/26/2020 CLINICAL DATA:  75 year old male postoperative day zero status post extension of pre-existing C4-C5 cervical fusion now from C3-C6. Postoperative left upper extremity pain and weakness. EXAM: CT CERVICAL SPINE WITHOUT CONTRAST TECHNIQUE: Multidetector CT imaging of the cervical spine was performed without intravenous contrast. Multiplanar CT image reconstructions were also generated. COMPARISON:  Cervical spine CT 09/05/2019. Cervical spine MRI 09/22/2020. FINDINGS: Alignment: Straightening of cervical lordosis not significantly changed from the MRI last month  (sagittal image 48). Cervicothoracic junction alignment is within normal limits. Bilateral posterior element alignment appears maintained. Skull base and vertebrae: Postoperative details are below. Visualized skull base is intact. No atlanto-occipital dissociation. Outside of the operative levels No acute osseous abnormality identified. Mild chronic C7 superior endplate deformity is stable. Soft tissues and spinal canal: Mild postoperative changes in the left neck with small volume postoperative soft tissue gas from the left submandibular space to the level of the left thyroid. Percutaneous postoperative drain is in place, located in the left prevertebral space at C4-C5. No soft tissue hematoma or fluid collection identified. Other noncontrast neck soft tissues are within normal limits, right greater than left calcified carotid atherosclerosis. Disc levels: C2-C3: Disc space loss, endplate spurring and left side facet hypertrophy appears stable since 2020. C3-C4: New ACDF. Anterior hardware and anteriorly positioned interbody spacer. The left C3 cortical screw lacks purchase compared to the other hardware (series 4, image 51) but the hardware at this level appears intact. Evidence of improved underlying thecal sac patency. Pre-existing right greater than left foraminal stenosis at this level, foraminal patency appears stable to improved on series 8, image 36. C4-C5: Chronic ACDF with solid interbody arthrodesis. New C4 and C5 cortical screws with no adverse features identified. C5-C6: New ACDF. Anterior plate and cortical screws. There is a unilateral right C7 cortical screw in place. Superimposed interbody spacer device which may appear slightly oblique on sagittal images (series 7, image 48). Chronic facet hypertrophy on the left. Left greater than right pre-existing neural foraminal stenosis at this level. No definite progressive foraminal encroachment compared to the MRI last month. See series 8, image 54.  C6-C7: Disc space loss, endplate spurring and bilateral facet hypertrophy here  appears stable since 2020. Right greater than left associated C7 foraminal stenosis. C7-T1: Stable mild to moderate facet hypertrophy greater on the right and right anterior endplate spurring. Upper chest: Negative. Other: Negative visible noncontrast brain parenchyma. Small volume fluid layering in the right sphenoid sinus. IMPRESSION: 1. New ACDF C3-C4 and C5-C6, with solid arthrodesis at pre-existing C4-C5 ACDF. The left C3 cortical screw has less purchase compared to the other hardware, but all hardware appears intact. 2. Percutaneous surgical drain in place. No hematoma or unexpected soft tissue change identified. 3. Suspect improved thecal sac patency at C3-C4 since the MRI last month. No new or progressive left side neural foraminal stenosis is identified. Pre-existing left side foraminal stenosis maximal at the left C3 and C6 nerve levels. 4. Other cervical levels appear stable by CT since 2020. Electronically Signed   By: Genevie Ann M.D.   On: 10/26/2020 20:45   MR CERVICAL SPINE WO CONTRAST  Addendum Date: 10/27/2020   ADDENDUM REPORT: 10/27/2020 16:08 ADDENDUM: Study discussed by telephone with Dr. Zada Finders on 10/27/2020 at 1559 hours. Electronically Signed   By: Genevie Ann M.D.   On: 10/27/2020 16:08   Result Date: 10/27/2020 CLINICAL DATA:  75 year old male postoperative day 1 status post extension of pre-existing C4-C5 cervical fusion now from C3-C6. Postoperative left upper extremity pain and weakness. EXAM: MRI CERVICAL SPINE WITHOUT CONTRAST TECHNIQUE: Multiplanar, multisequence MR imaging of the cervical spine was performed. No intravenous contrast was administered. COMPARISON:  Postoperative CT yesterday. Most recent preoperative MRI 09/22/2020. FINDINGS: Alignment: Stable from the November MRI. Vertebrae: Hardware susceptibility artifact now from C3-C6. No convincing marrow edema. However, there is trace new  bilateral facet joint fluid at the C3-C4 facets, slightly greater on the right (series 3, images 2 and 15. Cord: Evidence of abnormal spinal cord signal at the C3-C4 level (series 2, image 9) which on axial images appears mostly in the left hemi cord (series 6, image 10). Superimposed increased posterior mass effect on the cord there due to ligament flavum hypertrophy. Additionally, below that level at C5-C6 there is punctate hemorrhage within the cord, evidence of mild surrounding cord edema. See series 5, image 56 and series 6, image 20. Above C3-C4 and below C5-C6 cord signal remains normal. Posterior Fossa, vertebral arteries, paraspinal tissues: Cervicomedullary junction appears normal. Patchy T2 hyperintensity in the pons suggests chronic small vessel disease. Preserved major vascular flow voids in the neck, the left vertebral artery appears dominant. Postoperative drain likely remains in place. No prevertebral or other visible paraspinal soft tissue fluid collection. Disc levels: C2-C3: Stable since November. Mild multifactorial spinal stenosis related to disc bulging and ligament flavum hypertrophy. C3-C4: Interval postoperative changes, and although there seems to be less ventral mass effect on the spinal cord, left ligament flavum hypertrophy appears increased as stated above (series 6, image 10). Pre-existing right C4 neural foraminal stenosis appears stable to mildly improved. C4-C5:  Stable chronic ACDF. C5-C6: Interval postoperative changes. Stable ligament flavum hypertrophy. Abnormal cord signal at this level as stated above. No progressive spinal stenosis here. Pre-existing left C6 neural foraminal stenosis appears stable to mildly improved. C6-C7: Stable since November broad-based circumferential disc bulge with posterior element hypertrophy, mild spinal, mild to moderate left and moderate to severe right C7 foraminal stenosis. C7-T1: Stable since November. Facet hypertrophy greater on the right.  T1-T2: Stable small disc protrusion in the midline since December. IMPRESSION: 1. ACDF at C3-C4 and C5-C6 associated with abnormal spinal cord signal at both levels: - Acute  left hemi cord micro-hemorrhage or hemorrhagic contusion at C5-C6. Associated mild cord edema. -left hemicord edema versus myelomalacia at C3-C4. Increased cord mass effect here from the left ligament flavum. 2. Postoperative hardware susceptibility artifact with no convincing marrow edema, however, there is trace new bilateral C3-C4 facet joint fluid. 3. No worsening of left side neural foraminal stenosis (worst at the left C6 nerve level) since the November MRI. 4. Other cervical levels appear stable since November. Chronic C4-C5 ACDF. Electronically Signed: By: Odessa Fleming M.D. On: 10/27/2020 15:40   US RENAL  Result Date: 11/18/2020 CLINICAL DATA:  Acute renal injury. EXAM: RENAL / URINARY TRACT ULTRASOUND COMPLETE COMPARISON:  None. FINDINGS: Right Kidney: Renal measurements: 14.3 x 6.4 x 7.0 cm = volume: 334.7 mL. Mild renal cortical thinning and increased echogenicity. No worrisome renal lesions, obvious renal calculi or hydronephrosis. There are 2 septated cysts noted. The largest measures 7.7 x 3.1 x 3.6 cm. Left Kidney: Renal measurements: 13.1 x 6.6 x 4.8 cm = volume: 217.7 mL. Mild renal cortical thinning and increased echogenicity. No worrisome renal lesions or renal calculi. A simple appearing 1.5 cm cyst is noted. No hydronephrosis. Bladder: Normal Other: Gallbladder sludge and 12 mm gallstone noted. IMPRESSION: 1. Mild renal cortical thinning and increased echogenicity suggesting medical renal disease. 2. Bilateral renal cysts. No worrisome renal lesions or hydronephrosis. 3. Normal bladder. Electronically Signed   By: Rudie Meyer M.D.   On: 11/18/2020 11:20   DG CHEST PORT 1 VIEW  Result Date: 11/15/2020 CLINICAL DATA:  Acute respiratory failure, hypoxia, COPD EXAM: PORTABLE CHEST 1 VIEW COMPARISON:  11/14/2020 FINDINGS:  Endotracheal tube has been removed. Pulmonary insufflation has diminished slightly in the interval. There is asymmetric left-sided volume loss and persistent retrocardiac opacification likely reflecting changes of partial left lower lobe collapse. Tiny left pleural effusion present. No superimposed confluent pulmonary infiltrate. No pneumothorax. No pleural effusion on the right. Mild cardiomegaly is stable. Right upper extremity PICC line tip seen within the superior right atrium. Pulmonary vascularity is normal. IMPRESSION: Slight interval decrease in pulmonary insufflation following extubation. Lung volumes, however, are still within normal limits. Persistent retrocardiac opacification with asymmetric left-sided volume loss suggesting partial left lower lobe collapse. Tiny associated left pleural effusion. Electronically Signed   By: Helyn Numbers MD   On: 11/15/2020 06:53   DG Chest Port 1 View  Result Date: 11/14/2020 CLINICAL DATA:  Endotracheal tube.  COPD. EXAM: PORTABLE CHEST 1 VIEW COMPARISON:  Chest radiograph November 12, 2020. FINDINGS: The endotracheal tube, right-sided PICC and enteric tube are in similar position. Similar pulmonary vascular congestion and mildly enlarged cardiac silhouette. Small left pleural effusion. Left basilar opacities. No visible pneumothorax. IMPRESSION: 1. Stable support devices. 2. Small left pleural effusion with left basilar opacities, which could represent atelectasis, aspiration, and/or pneumonia. Electronically Signed   By: Feliberto Harts MD   On: 11/14/2020 07:20   DG Chest Port 1 View  Result Date: 11/12/2020 CLINICAL DATA:  Respiratory distress. EXAM: PORTABLE CHEST 1 VIEW COMPARISON:  11/10/2020 FINDINGS: The endotracheal tube, feeding tube and right PICC lines are stable. The heart is mildly enlarged but unchanged. Stable vascular congestion, streaky areas of atelectasis and bilateral pleural thickening. No pneumothorax. IMPRESSION: 1. Stable support  apparatus. 2. Persistent patchy areas of atelectasis and bilateral areas of pleural thickening. Electronically Signed   By: Rudie Meyer M.D.   On: 11/12/2020 07:26   DG Chest Port 1 View  Result Date: 11/10/2020 CLINICAL DATA:  Acute respiratory distress  EXAM: PORTABLE CHEST 1 VIEW COMPARISON:  Radiograph 11/10/2020 FINDINGS: Endotracheal tube and feeding tube are unchanged. PICC line unchanged. Stable cardiac silhouette. There is bilateral patchy airspace disease. No pneumothorax. Anterior cervical fusion IMPRESSION: 1. Stable support apparatus. 2. Patchy bilateral airspace disease suggesting multifocal pneumonia. 3. No interval change. Electronically Signed   By: Suzy Bouchard M.D.   On: 11/10/2020 10:56   DG Chest Port 1 View  Result Date: 11/10/2020 CLINICAL DATA:  Acute respiratory failure. EXAM: PORTABLE CHEST 1 VIEW COMPARISON:  12.421 FINDINGS: 0517 hours. Endotracheal tube tip is approximately 7.2 cm above the base of the carina. Feeding tube tip is in the mid stomach. Right PICC line tip overlies the mid SVC level. The cardio pericardial silhouette is enlarged. Retrocardiac collapse/consolidation is similar to prior. Telemetry leads overlie the chest. IMPRESSION: 1. Stable exam. Retrocardiac collapse/consolidation. 2. Support apparatus as described. Electronically Signed   By: Misty Stanley M.D.   On: 11/10/2020 07:01   DG Chest Port 1 View  Result Date: 11/09/2020 CLINICAL DATA:  Respiratory failure. EXAM: PORTABLE CHEST 1 VIEW COMPARISON:  11/08/2020 FINDINGS: 0600 hours. Endotracheal tube tip is 6.9 cm above the base of the carina. A feeding tube passes into the stomach although the distal tip position is not included on the film. Right PICC line tip projects over the mid SVC level. The cardio pericardial silhouette is enlarged. Interstitial markings are diffusely coarsened with chronic features. Hazy parahilar and retrocardiac left base opacity again noted with improvement at the  left base. Telemetry leads overlie the chest. IMPRESSION: 1. Interval improvement in aeration at the left lung base consistent with decreasing collapse/consolidation. 2. Otherwise no substantial change. Electronically Signed   By: Misty Stanley M.D.   On: 11/09/2020 08:57   DG Chest Port 1 View  Result Date: 11/08/2020 CLINICAL DATA:  Hypoxia EXAM: PORTABLE CHEST 1 VIEW COMPARISON:  November 08, 2020 study obtained earlier in the day FINDINGS: Endotracheal tube tip is 5.9 cm above the carina. Enteric tube tip is below the diaphragm. No pneumothorax. There is a left pleural effusion with consolidation in a portion of the left lower lobe. There is perihilar interstitial edema bilaterally, slightly more on the left than on the right. No airspace consolidation on the right. Heart is mildly enlarged with a degree of pulmonary venous hypertension. No adenopathy evident. IMPRESSION: Tube positions as described without pneumothorax. New left pleural effusion. There is perihilar interstitial edema with cardiomegaly and pulmonary vascular congestion. Suspect a degree of congestive heart failure. Consolidation left lower lobe could represent alveolar edema but is concerning for potential superimposed pneumonia. Electronically Signed   By: Lowella Grip III M.D.   On: 11/08/2020 11:58   DG Chest Port 1 View  Result Date: 11/08/2020 CLINICAL DATA:  Healthcare associated pneumonia EXAM: PORTABLE CHEST 1 VIEW COMPARISON:  11/07/2020 FINDINGS: Feeding tube with tip at least in the stomach. Asymmetric hazy opacity about the left hilum. There may also be vascular congestion in this patient with cardiomegaly and vascular pedicle widening. IMPRESSION: 1. Cardiomegaly and vascular congestion. 2. Persistent left perihilar opacity which may reflect residual atelectasis or infection. Electronically Signed   By: Monte Fantasia M.D.   On: 11/08/2020 06:33   DG Chest Port 1 View  Result Date: 11/07/2020 CLINICAL DATA:   Mucous plugging EXAM: PORTABLE CHEST 1 VIEW COMPARISON:  11/07/2019 FINDINGS: Soft feeding tube enters the abdomen. Right lung remains clear except for minimal volume loss at the base. Near complete re-expansion of the left  lung. Mild residual perihilar density that could be atelectasis or re-expansion edema. No worsening or new finding. IMPRESSION: Near complete re-expansion of the left lung. Mild residual perihilar density that could be atelectasis or re-expansion edema. Electronically Signed   By: Nelson Chimes M.D.   On: 11/07/2020 08:04   DG Chest Port 1 View  Result Date: 11/06/2020 CLINICAL DATA:  Respiratory failure, BiPAP, recent neck surgery EXAM: PORTABLE CHEST 1 VIEW COMPARISON:  11/04/2020 FINDINGS: A transesophageal tube tip terminates below the margins of imaging, beyond the GE junction. Telemetry leads overlie the chest. Interval development of dense opacity with associated air bronchograms throughout much of the left upper and lower lobe with some sparing in the region of the mid lung periphery. Some persistent patchy opacity is seen in the right lung, not significantly changed from comparison. No pneumothorax. Some bilateral pleural thickening likely reflects layering effusion. No visible pneumothorax. Prior cervical fusion. No acute osseous or soft tissue abnormality. Degenerative changes are present in the imaged spine and shoulders. IMPRESSION: 1. Interval development of dense opacity with air bronchograms throughout much of the left upper and lower lobe with some sparing in the region of the mid lung periphery. Given the time course, could reflect a combination of atelectasis and airspace disease as well as likely layering pleural fluid. Persistent patchy opacity in the right lung and bilateral effusions. 2. Support devices as above. These results will be called to the ordering clinician or representative by the Radiologist Assistant, and communication documented in the PACS or [employer]. Electronically Signed   By: Lovena Le M.D.   On: 11/06/2020 05:41   DG Chest Port 1 View  Result Date: 11/04/2020 CLINICAL DATA:  Respiratory failure EXAM: PORTABLE CHEST 1 VIEW COMPARISON:  November 02, 2020 FINDINGS: Stable cardiomegaly. The hila and mediastinum are unchanged. The feeding tube terminates below today's film. Mild hazy opacity in the periphery of the right lung was not seen previously. Opacity in the left base is likely unchanged. No other acute abnormalities. IMPRESSION: 1. Stable cardiomegaly. 2. Mild hazy opacity in the periphery of the right lung, new in the interval. This finding could represent developing infection or asymmetric edema. 3. Stable opacity in left base. Electronically Signed   By: Dorise Bullion III M.D   On: 11/04/2020 10:46   DG CHEST PORT 1 VIEW  Result Date: 11/02/2020 CLINICAL DATA:  Shortness of breath EXAM: PORTABLE CHEST 1 VIEW COMPARISON:  11/01/2020 FINDINGS: Nasogastric tube coursing below the diaphragm. Patchy areas of airspace disease throughout the left lung concerning for multilobar pneumonia. No pleural effusion or pneumothorax. Heart and mediastinal contours are unremarkable. No acute osseous abnormality. IMPRESSION: Patchy areas of airspace disease throughout the left lung concerning for multilobar pneumonia. Electronically Signed   By: Kathreen Devoid   On: 11/02/2020 12:37   DG CHEST PORT 1 VIEW  Result Date: 11/01/2020 CLINICAL DATA:  Hypoxia. EXAM: PORTABLE CHEST 1 VIEW COMPARISON:  10/31/2020 FINDINGS: 0523 hours. The cardio pericardial silhouette is enlarged. Left parahilar in retrocardiac left base airspace disease is similar to prior with associated left pleural effusion. Right lung remains clear. Interstitial markings are diffusely coarsened with chronic features. A feeding tube passes into the stomach although the distal tip position is not included on the film. Telemetry leads overlie the chest. IMPRESSION: No substantial  interval change in exam. Left pleural effusion with left parahilar and left base airspace disease. Electronically Signed   By: Misty Stanley M.D.   On: 11/01/2020 07:31  DG CHEST PORT 1 VIEW  Result Date: 10/31/2020 CLINICAL DATA:  Dyspnea EXAM: PORTABLE CHEST 1 VIEW COMPARISON:  10/29/2020 FINDINGS: Improved aeration is noted in the left lung. Left effusion is again identified as well as some basilar atelectasis. These changes may be in part due to improved upright positioning. Right lung is clear. Feeding catheter extends towards the stomach. Postsurgical changes in the cervical spine are noted. IMPRESSION: Improved aeration in the left lung which may be related to more upright positioning. Persistent left effusion and basilar atelectasis is noted. Electronically Signed   By: Inez Catalina M.D.   On: 10/31/2020 12:30   DG Chest Port 1 View  Result Date: 10/29/2020 CLINICAL DATA:  75 year old male with abnormal respiration. EXAM: PORTABLE CHEST 1 VIEW COMPARISON:  10/29/2019 FINDINGS: The heart size and mediastinal contours are obscured, unchanged. Interval increased opacification of the left hemithorax. Similar appearing right lung base linear atelectasis. The visualized skeletal structures are unremarkable. IMPRESSION: Interval increased opacification left hemithorax as could be seen with enlarging left pleural effusion and/or worsening obstructive atelectasis. Electronically Signed   By: Ruthann Cancer MD   On: 10/29/2020 08:01   DG Chest Port 1 View  Result Date: 10/28/2020 CLINICAL DATA:  Postoperative, ACDF EXAM: PORTABLE CHEST 1 VIEW COMPARISON:  12/26/2018 FINDINGS: There is extensive, dense heterogeneous airspace opacity and consolidation of the left lung with a moderate, layering pleural effusion. The heart and mediastinum are largely obscured. There is some bandlike scarring or atelectasis of the right lung base; the right lung is otherwise normally aerated. IMPRESSION: 1. There is  extensive, dense heterogeneous airspace opacity and consolidation of the left lung with a moderate, layering pleural effusion. Findings are concerning for infection or aspiration. 2. There is some bandlike scarring or atelectasis of the right lung base; the right lung is otherwise normally aerated. Electronically Signed   By: Eddie Candle M.D.   On: 10/28/2020 18:44   DG Abd Portable 1V  Result Date: 10/28/2020 CLINICAL DATA:  NG tube placement EXAM: PORTABLE ABDOMEN - 1 VIEW COMPARISON:  None. FINDINGS: NG tube appears coiled in the distal thoracic esophagus. Opacity seen in the lung bases are poorly visualized. Bowel gas pattern is incompletely included in the level of imaging. No high-grade obstructive pattern is seen in the margins of imaging. IMPRESSION: 1. NG tube appears coiled in the distal thoracic esophagus. Recommend repositioning into the stomach. 2. Opacity in the lung bases, poorly visualized in the level of imaging. These results will be called to the ordering clinician or representative by the Radiologist Assistant, and communication documented in the PACS or [employer]. Electronically Signed   By: Lovena Le M.D.   On: 10/28/2020 17:46   DG Swallowing Func-Speech Pathology  Result Date: 11/16/2020 Objective Swallowing Evaluation: Type of Study: MBS-Modified Barium Swallow Study  Patient Details Name: MARCELUS GARRIS MRN: [id number] Date of Birth: July 19, 1946 Today's Date: 11/16/2020 Time: SLP Start Time (ACUTE ONLY): 1018 -SLP Stop Time (ACUTE ONLY): 1040 SLP Time Calculation (min) (ACUTE ONLY): 22 min Past Medical History: Past Medical History: Diagnosis Date . Anxiety  . Anxiety disorder 05/05/2014  unspecifed . Cancer [facility])   prostate cancer  . CHF (congestive heart failure) ([place])  . Colonic polyp  . COPD (chronic obstructive pulmonary disease) ([place])  . Depression  . Dyspnea  . Elevated prostate specific antigen (PSA)  . Essential (primary) hypertension 05/05/2014 . GI bleed   due to  diverticulitis  . Heart murmur   hx of 1960s .  History of thyroid cancer 05/05/2014 . Hypercholesteremia  . Hyperlipidemia  . Hypertension  . Hypothyroidism 05/05/2014 . Impotence of organic origin  . Male erectile dysfunction 12/29/2012 . Malignant neoplasm of prostate ([place])   10/30/2011 T1c - Identified by needle biopsy . Gleason 7 (3+4) 5 of 12 cores, Bilateral  . Prediabetes 06/22/2017 . Prostate cancer ([place])  . Shortness of breath on exertion 07/11/2015 . Sleep apnea   CPAP , report on chart , not used in last 2 weeks  . Thyroid cancer ([place])  . Thyroid disease  . Urinary obstruction   not elsewhere classified Past Surgical History: Past Surgical History: Procedure Laterality Date . BACK SURGERY    lumbar 1987  . BACK SURGERY    C-6- Plate&Pin . CATARACT EXTRACTION W/PHACO Right 09/15/2018  Procedure: CATARACT EXTRACTION PHACO AND INTRAOCULAR LENS PLACEMENT ([place]) RIGHT;  Surgeon: Leandrew Koyanagi, MD;  Location: [place];  Service: Ophthalmology;  Laterality: Right;  Requests to be last case . CATARACT EXTRACTION W/PHACO Left 10/06/2018  Procedure: CATARACT EXTRACTION PHACO AND INTRAOCULAR LENS PLACEMENT ([place])  LEFT;  Surgeon: Leandrew Koyanagi, MD;  Location: [place];  Service: Ophthalmology;  Laterality: Left; . CERVICAL DISC SURGERY   . COLONOSCOPY    10/06/2011, 06/05/2006, 05/06/2002 adematous polyps: CBF 09/2014; Recall Ltr mailed 02/27/2015 (dw) . COLONOSCOPY WITH PROPOFOL N/A 07/06/2015  Procedure: COLONOSCOPY WITH PROPOFOL;  Surgeon: Manya Silvas, MD;  Location: [facility] ENDOSCOPY;  Service: Endoscopy;  Laterality: N/A; . COLONOSCOPY WITH PROPOFOL N/A 09/01/2018  Procedure: COLONOSCOPY WITH PROPOFOL;  Surgeon: Manya Silvas, MD;  Location: [facility] ENDOSCOPY;  Service: Endoscopy;  Laterality: N/A; . COLONOSCOPY WITH PROPOFOL N/A 04/17/2020  Procedure: COLONOSCOPY WITH PROPOFOL;  Surgeon: Lucilla Lame, MD;  Location: [facility] ENDOSCOPY;  Service: Endoscopy;  Laterality: N/A; .  ESOPHAGOGASTRODUODENOSCOPY (EGD) WITH PROPOFOL N/A 04/17/2020  Procedure: ESOPHAGOGASTRODUODENOSCOPY (EGD) WITH PROPOFOL;  Surgeon: Lucilla Lame, MD;  Location: [facility] ENDOSCOPY;  Service: Endoscopy;  Laterality: N/A; . EYE SURGERY   . HERNIA REPAIR    umbilical hernia repair  . LAMINECTOMY FOR EXCISION / EVACUATION INTRASPINAL LESION    lumbar . OTHER SURGICAL HISTORY    right rotator cuff surgery  . OTHER SURGICAL HISTORY    bilateral tubes in ears  . POSTERIOR CERVICAL LAMINECTOMY N/A 10/27/2020  Procedure: POSTERIOR CERVICAL LAMINECTOMY CERVICAL THREE-CERVICAL SIX;  Surgeon: Judith Part, MD;  Location: [place];  Service: Neurosurgery;  Laterality: N/A; . POSTERIOR LAMINECTOMY / DECOMPRESSION CERVICAL SPINE   . PROSTATE BIOPSY    10/22/2011 Volume:55.8 cc's, PSA:4.5, Free PSA:12% . ROBOT ASSISTED LAPAROSCOPIC RADICAL PROSTATECTOMY  12/15/2011  Procedure: ROBOTIC ASSISTED LAPAROSCOPIC RADICAL PROSTATECTOMY LEVEL 2;  Surgeon: Dutch Gray, MD;  Location: [facility];  Service: Urology;  Laterality: N/A;   . ROTATOR CUFF REPAIR Right  . TONSILLECTOMY   . TOTAL THYROIDECTOMY   HPI: Pt is a 75 yo male s/p anterior cervical discetomy at C3/4 and C5/6 following removal of existing plate, admitted afterwards for sudden onset weakness and pain in his LUE. CT unremarkable; MRI pending. On 12/11 he was made NPO for difficulty swallowing. On 12/11 he underwent C3-6 posterior decompressive laminectomies during which he was intubated. PMH includes: thyroid ca, prostate ca, HTN, HLD, dyspnea, depression, COPD, CHF, anxiety. MBS 11/01/20 recommending Dys 1(puree) and pudding thick liqui. Following day had respiratory decline requiring Bipap until 12/23 when he was intubated and extubated 12/29. New order placed for swallow assessment (considered treat since already on caseload).  Subjective: pt confused, moaning in pain Assessment /  Plan / Recommendation CHL IP CLINICAL IMPRESSIONS 11/16/2020 Clinical Impression Pt exhibits a  mild-moderate pharyngoesophageal dysphagia marked by aspiration and esophgeal involvement. His laryngeal elevation and anterior hyoid movement/strength appears adequate as well as timing of swallow onset. One instance of aspiration from thin barium falling from oral cavity and aspirating  before swallow onset occured(silent). His vallecular and pyriform sinus residue was mild and noted to have backflow from his UES. When esophagus scanned it revealed barium filling most of esophagus and never completely emptying despite thinner consistencies with thin and nectar. Therapist concerned that thicker consistencies will fill esophagus, reflux and possibly aspirating after the swallow. He reports having esophageal dilation previously and was having problems with esophagus prior to admission. Pt noted to cough during study when barium not obseved in vestibule or trachea. Therapist recommends more conservative approach with allowing nectar thick liquids twice a day from floor stock from [employer] only with full supervision and therapist can work with pt on increasing to full meal trays. He may need work up for esophagus after discharge or if symptoms continue SLP Visit Diagnosis Dysphagia, pharyngoesophageal phase (R13.14) Attention and concentration deficit following -- Frontal lobe and executive function deficit following -- Impact on safety and function Moderate aspiration risk   CHL IP TREATMENT RECOMMENDATION 11/16/2020 Treatment Recommendations Therapy as outlined in treatment plan below   Prognosis 11/16/2020 Prognosis for Safe Diet Advancement Good Barriers to Reach Goals -- Barriers/Prognosis Comment -- CHL IP DIET RECOMMENDATION 11/16/2020 SLP Diet Recommendations Other (Comment) Liquid Administration via Spoon Medication Administration Crushed with puree Compensations -- Postural Changes --   CHL IP OTHER RECOMMENDATIONS 11/16/2020 Recommended Consults -- Oral Care Recommendations Oral care QID Other  Recommendations --   CHL IP FOLLOW UP RECOMMENDATIONS 11/16/2020 Follow up Recommendations Skilled Nursing facility   [facility] IP FREQUENCY AND DURATION 11/16/2020 Speech Therapy Frequency (ACUTE ONLY) min 2x/week Treatment Duration 2 weeks      CHL IP ORAL PHASE 11/16/2020 Oral Phase Impaired Oral - Pudding Teaspoon -- Oral - Pudding Cup -- Oral - Honey Teaspoon WFL Oral - Honey Cup -- Oral - Nectar Teaspoon WFL Oral - Nectar Cup WFL Oral - Nectar Straw -- Oral - Thin Teaspoon -- Oral - Thin Cup Decreased bolus cohesion Oral - Thin Straw -- Oral - Puree WFL Oral - Mech Soft -- Oral - Regular -- Oral - Multi-Consistency -- Oral - Pill -- Oral Phase - Comment --  CHL IP PHARYNGEAL PHASE 11/16/2020 Pharyngeal Phase Impaired Pharyngeal- Pudding Teaspoon -- Pharyngeal -- Pharyngeal- Pudding Cup -- Pharyngeal -- Pharyngeal- Honey Teaspoon Pharyngeal residue - valleculae Pharyngeal -- Pharyngeal- Honey Cup Pharyngeal residue - valleculae Pharyngeal -- Pharyngeal- Nectar Teaspoon WFL;Pharyngeal residue - valleculae Pharyngeal -- Pharyngeal- Nectar Cup Pharyngeal residue - valleculae Pharyngeal -- Pharyngeal- Nectar Straw -- Pharyngeal -- Pharyngeal- Thin Teaspoon -- Pharyngeal -- Pharyngeal- Thin Cup Penetration/Aspiration before swallow Pharyngeal Material enters airway, passes BELOW cords and not ejected out despite cough attempt by patient Pharyngeal- Thin Straw -- Pharyngeal -- Pharyngeal- Puree Pharyngeal residue - valleculae Pharyngeal -- Pharyngeal- Mechanical Soft -- Pharyngeal -- Pharyngeal- Regular -- Pharyngeal -- Pharyngeal- Multi-consistency -- Pharyngeal -- Pharyngeal- Pill -- Pharyngeal -- Pharyngeal Comment --  CHL IP CERVICAL ESOPHAGEAL PHASE 11/16/2020 Cervical Esophageal Phase Impaired Pudding Teaspoon -- Pudding Cup -- Honey Teaspoon -- Honey Cup -- Nectar Teaspoon -- Nectar Cup -- Nectar Straw -- Thin Teaspoon -- Thin Cup -- Thin Straw -- Puree -- Mechanical Soft -- Regular -- Multi-consistency -- Pill --  Cervical Esophageal  Comment -- [place] Siren 11/16/2020, 2:21 PM Orbie Pyo Colvin Caroli.Ed Actor Pager [phone] Office [phone]              DG Swallowing Func-Speech Pathology  Result Date: 11/01/2020 .Objective Swallowing Evaluation: Type of Study: MBS-Modified Barium Swallow Study  Patient Details Name: VALLON RIOFRIO MRN: [id number] Date of Birth: Jun 17, 1946 Today's Date: 11/01/2020 Time: SLP Start Time (ACUTE ONLY): 1310 -SLP Stop Time (ACUTE ONLY): 1330 SLP Time Calculation (min) (ACUTE ONLY): 20 min Past Medical History: Past Medical History: Diagnosis Date . Anxiety  . Anxiety disorder 05/05/2014  unspecifed . Cancer [facility])   prostate cancer  . CHF (congestive heart failure) ([place])  . Colonic polyp  . COPD (chronic obstructive pulmonary disease) ([place])  . Depression  . Dyspnea  . Elevated prostate specific antigen (PSA)  . Essential (primary) hypertension 05/05/2014 . GI bleed   due to diverticulitis  . Heart murmur   hx of 1960s . History of thyroid cancer 05/05/2014 . Hypercholesteremia  . Hyperlipidemia  . Hypertension  . Hypothyroidism 05/05/2014 . Impotence of organic origin  . Male erectile dysfunction 12/29/2012 . Malignant neoplasm of prostate ([place])   10/30/2011 T1c - Identified by needle biopsy . Gleason 7 (3+4) 5 of 12 cores, Bilateral  . Prediabetes 06/22/2017 . Prostate cancer ([place])  . Shortness of breath on exertion 07/11/2015 . Sleep apnea   CPAP , report on chart , not used in last 2 weeks  . Thyroid cancer ([place])  . Thyroid disease  . Urinary obstruction   not elsewhere classified Past Surgical History: Past Surgical History: Procedure Laterality Date . BACK SURGERY    lumbar 1987  . BACK SURGERY    C-6- Plate&Pin . CATARACT EXTRACTION W/PHACO Right 09/15/2018  Procedure: CATARACT EXTRACTION PHACO AND INTRAOCULAR LENS PLACEMENT ([place]) RIGHT;  Surgeon: Leandrew Koyanagi, MD;  Location: [place];  Service: Ophthalmology;  Laterality: Right;   Requests to be last case . CATARACT EXTRACTION W/PHACO Left 10/06/2018  Procedure: CATARACT EXTRACTION PHACO AND INTRAOCULAR LENS PLACEMENT ([place])  LEFT;  Surgeon: Leandrew Koyanagi, MD;  Location: [place];  Service: Ophthalmology;  Laterality: Left; . CERVICAL DISC SURGERY   . COLONOSCOPY    10/06/2011, 06/05/2006, 05/06/2002 adematous polyps: CBF 09/2014; Recall Ltr mailed 02/27/2015 (dw) . COLONOSCOPY WITH PROPOFOL N/A 07/06/2015  Procedure: COLONOSCOPY WITH PROPOFOL;  Surgeon: Manya Silvas, MD;  Location: [facility] ENDOSCOPY;  Service: Endoscopy;  Laterality: N/A; . COLONOSCOPY WITH PROPOFOL N/A 09/01/2018  Procedure: COLONOSCOPY WITH PROPOFOL;  Surgeon: Manya Silvas, MD;  [facility] ENDOSCOPY;  Service: Endoscopy;  Laterality: N/A; . COLONOSCOPY WITH PROPOFOL N/A 04/17/2020  Procedure: COLONOSCOPY WITH PROPOFOL;  Surgeon: Lucilla Lame, MD;  Location: [facility] ENDOSCOPY;  Service: Endoscopy;  Laterality: N/A; . ESOPHAGOGASTRODUODENOSCOPY (EGD) WITH PROPOFOL N/A 04/17/2020  Procedure: ESOPHAGOGASTRODUODENOSCOPY (EGD) WITH PROPOFOL;  Surgeon: Lucilla Lame, MD;  Location: [facility] ENDOSCOPY;  Service: Endoscopy;  Laterality: N/A; . EYE SURGERY   . HERNIA REPAIR    umbilical hernia repair  . LAMINECTOMY FOR EXCISION / EVACUATION INTRASPINAL LESION    lumbar . OTHER SURGICAL HISTORY    right rotator cuff surgery  . OTHER SURGICAL HISTORY    bilateral tubes in ears  . POSTERIOR CERVICAL LAMINECTOMY N/A 10/27/2020  Procedure: POSTERIOR CERVICAL LAMINECTOMY CERVICAL THREE-CERVICAL SIX;  Surgeon: Judith Part, MD;  Location: [place];  Service: Neurosurgery;  Laterality: N/A; . POSTERIOR LAMINECTOMY / DECOMPRESSION CERVICAL SPINE   . PROSTATE BIOPSY    10/22/2011 Volume:55.8 cc's,  PSA:4.5, Free PSA:12% . ROBOT ASSISTED LAPAROSCOPIC RADICAL PROSTATECTOMY  12/15/2011  Procedure: ROBOTIC ASSISTED LAPAROSCOPIC RADICAL PROSTATECTOMY LEVEL 2;  Surgeon: Dutch Gray, MD;  [facility];  Service: Urology;  Laterality:  N/A;   . ROTATOR CUFF REPAIR Right  . TONSILLECTOMY   . TOTAL THYROIDECTOMY   HPI: Pt is a 75 yo male s/p anterior cervical discetomy at C3/4 and C5/6 following removal of existing plate, admitted afterwards for sudden onset weakness and pain in his LUE. CT unremarkable; MRI pending. On 12/11 he was made NPO for difficulty swallowing. On 12/11 he underwent C3-6 posterior decompressive laminectomies during which he was intubated. PMH includes: thyroid ca, prostate ca, HTN, HLD, dyspnea, depression, COPD, CHF, anxiety  Subjective: pt confused, moaning in pain Assessment / Plan / Recommendation CHL IP CLINICAL IMPRESSIONS 11/01/2020 Clinical Impression Pt exhibited a mild pharyngoesophageal dysphagia s/p ACDF and then required posterior decompression laminectomies. Phayrngeal edema observed however not as significant as anticipated. His anterior hyoid movement is adequate with mildly reduced laryngeal elevation although able to ahcieve epiglottic deflection. Overall, timing of laryngeal protection was good however one instance of  teaspoon honey barium penetrated vestibule (deep) before closure was reached with barium ascending to the top of epiglottis. Residue was min-mild in vallecuale and pyriform sinuses and began to spill over his interarytenoid space. He was sensate with reflexive throat clear then re-swallowed which cleared. Suspect decreased pressures in cervical esophagus as barium passed through UES with partial reflux to pyriform sinuses. Recommend initiate puree (Dys 1), pudding thick liquids, crush pills or via Cortrak. Pt should clear throat throughout meals. SLP Visit Diagnosis Dysphagia, pharyngoesophageal phase (R13.14);Dysphagia, pharyngeal phase (R13.13) Attention and concentration deficit following -- Frontal lobe and executive function deficit following -- Impact on safety and function Moderate aspiration risk   CHL IP TREATMENT RECOMMENDATION 11/01/2020 Treatment Recommendations Therapy as  outlined in treatment plan below   Prognosis 11/01/2020 Prognosis for Safe Diet Advancement Good Barriers to Reach Goals -- Barriers/Prognosis Comment -- CHL IP DIET RECOMMENDATION 11/01/2020 SLP Diet Recommendations Dysphagia 1 (Puree) solids;Pudding thick liquid Liquid Administration via -- Medication Administration Crushed with puree Compensations Slow rate;Small sips/bites Postural Changes Seated upright at 90 degrees;Remain semi-upright after after feeds/meals (Comment)   CHL IP OTHER RECOMMENDATIONS 11/01/2020 Recommended Consults -- Oral Care Recommendations Oral care BID Other Recommendations Order thickener from pharmacy   CHL IP FOLLOW UP RECOMMENDATIONS 11/01/2020 Follow up Recommendations Skilled Nursing facility   [facility] IP FREQUENCY AND DURATION 11/01/2020 Speech Therapy Frequency (ACUTE ONLY) min 2x/week Treatment Duration 2 weeks      CHL IP ORAL PHASE 11/01/2020 Oral Phase Impaired Oral - Pudding Teaspoon -- Oral - Pudding Cup -- Oral - Honey Teaspoon Decreased bolus cohesion Oral - Honey Cup -- Oral - Nectar Teaspoon -- Oral - Nectar Cup -- Oral - Nectar Straw -- Oral - Thin Teaspoon -- Oral - Thin Cup -- Oral - Thin Straw -- Oral - Puree WFL Oral - Mech Soft -- Oral - Regular -- Oral - Multi-Consistency -- Oral - Pill -- Oral Phase - Comment --  CHL IP PHARYNGEAL PHASE 11/01/2020 Pharyngeal Phase Impaired Pharyngeal- Pudding Teaspoon -- Pharyngeal -- Pharyngeal- Pudding Cup -- Pharyngeal -- Pharyngeal- Honey Teaspoon Pharyngeal residue - valleculae;Pharyngeal residue - pyriform;Inter-arytenoid space residue;Penetration/Aspiration during swallow;Penetration/Aspiration before swallow Pharyngeal Material enters airway, CONTACTS cords and then ejected out;Material enters airway, CONTACTS cords and not ejected out Pharyngeal- Honey Cup -- Pharyngeal -- Pharyngeal- Nectar Teaspoon -- Pharyngeal -- Pharyngeal- Nectar Cup -- Pharyngeal --  Pharyngeal- Nectar Straw -- Pharyngeal -- Pharyngeal- Thin Teaspoon --  Pharyngeal -- Pharyngeal- Thin Cup -- Pharyngeal -- Pharyngeal- Thin Straw -- Pharyngeal -- Pharyngeal- Puree Pharyngeal residue - valleculae Pharyngeal -- Pharyngeal- Mechanical Soft -- Pharyngeal -- Pharyngeal- Regular -- Pharyngeal -- Pharyngeal- Multi-consistency -- Pharyngeal -- Pharyngeal- Pill -- Pharyngeal -- Pharyngeal Comment --  CHL IP CERVICAL ESOPHAGEAL PHASE 11/01/2020 Cervical Esophageal Phase Impaired Pudding Teaspoon -- Pudding Cup -- Honey Teaspoon -- Honey Cup -- Nectar Teaspoon -- Nectar Cup -- Nectar Straw -- Thin Teaspoon -- Thin Cup -- Thin Straw -- Puree -- Mechanical Soft -- Regular -- Multi-consistency -- Pill -- Cervical Esophageal Comment -- [place] Siren 11/01/2020, 3:04 PM  Orbie Pyo Litaker M.Ed Actor Pager [phone] Office [phone]             DG C-Arm 1-60 Min  Result Date: 10/27/2020 CLINICAL DATA:  C3-C6 laminectomy EXAM: CERVICAL SPINE - 2-3 VIEW; DG C-ARM 1-60 MIN COMPARISON:  10/26/2020 intraoperative images FINDINGS: Changes of multilevel anterior fusion. Posterior surgical instruments are in place overlying the C3 and C4 spinous processes. IMPRESSION: Intraoperative localization as above. Electronically Signed   By: Rolm Baptise M.D.   On: 10/27/2020 20:49   [place] EKG SITE RITE  Result Date: 11/08/2020 If Site Rite image not attached, placement could not be confirmed due to current cardiac rhythm.    Time Spent in minutes  30     Desiree Hane M.D on 11/18/2020 at 6:17 PM  To page go to [email or web address] - password [email or web address]

## 2020-11-18 NOTE — Progress Notes (Signed)
Patient appears stable.  Incision okay.  Arouses to stimulation.  Continue current management and await rehab

## 2020-11-18 NOTE — Progress Notes (Signed)
Called to evaluate patient for desaturation. Irregular waveform on pulse ox due to not being on ear. Placed on finger. Patient currently 99% on 6L [place]. BIPAP held at this time due to wound on nose due to previous BIPAP use. Encouraged patient to cough. NP.

## 2020-11-18 NOTE — Progress Notes (Signed)
eLink Physician-Brief Progress Note Patient Name: [name] [name] DOB: 11/28/1945 MRN: [id number]   Date of Service  11/18/2020  HPI/Events of Note  Need CBG's changed from ac/hs to q 4 with appropriate SSI  eICU Interventions  Order modified      Intervention Category Minor Interventions: Routine modifications to care plan (e.g. PRN medications for pain, fever)  Oretha Milch 11/18/2020, 12:43 AM

## 2020-11-19 DIAGNOSIS — I5032 Chronic diastolic (congestive) heart failure: Secondary | ICD-10-CM | POA: Diagnosis not present

## 2020-11-19 DIAGNOSIS — J9601 Acute respiratory failure with hypoxia: Secondary | ICD-10-CM | POA: Diagnosis not present

## 2020-11-19 DIAGNOSIS — E039 Hypothyroidism, unspecified: Secondary | ICD-10-CM

## 2020-11-19 DIAGNOSIS — J690 Pneumonitis due to inhalation of food and vomit: Secondary | ICD-10-CM

## 2020-11-19 DIAGNOSIS — N179 Acute kidney failure, unspecified: Secondary | ICD-10-CM | POA: Diagnosis not present

## 2020-11-19 DIAGNOSIS — J189 Pneumonia, unspecified organism: Secondary | ICD-10-CM | POA: Diagnosis not present

## 2020-11-19 LAB — GLUCOSE, CAPILLARY
Glucose-Capillary: 106 mg/dL — ABNORMAL HIGH (ref 70–99)
Glucose-Capillary: 114 mg/dL — ABNORMAL HIGH (ref 70–99)
Glucose-Capillary: 129 mg/dL — ABNORMAL HIGH (ref 70–99)
Glucose-Capillary: 136 mg/dL — ABNORMAL HIGH (ref 70–99)
Glucose-Capillary: 137 mg/dL — ABNORMAL HIGH (ref 70–99)
Glucose-Capillary: 151 mg/dL — ABNORMAL HIGH (ref 70–99)

## 2020-11-19 LAB — BASIC METABOLIC PANEL
Anion gap: 10 (ref 5–15)
BUN: 70 mg/dL — ABNORMAL HIGH (ref 8–23)
CO2: 30 mmol/L (ref 22–32)
Calcium: 9.9 mg/dL (ref 8.9–10.3)
Chloride: 111 mmol/L (ref 98–111)
Creatinine, Ser: 0.79 mg/dL (ref 0.61–1.24)
GFR, Estimated: >60 mL/min (ref >60)
Glucose, Bld: 117 mg/dL — ABNORMAL HIGH (ref 70–99)
Potassium: 4.3 mmol/L (ref 3.5–5.1)
Sodium: 151 mmol/L — ABNORMAL HIGH (ref 135–145)

## 2020-11-19 NOTE — Plan of Care (Signed)

## 2020-11-19 NOTE — Plan of Care (Signed)

## 2020-11-19 NOTE — Progress Notes (Signed)
  Speech Language Pathology Treatment: Dysphagia  Patient Details Name: [name] [name] MRN: [id number] DOB: Nov 19, 1945 Today's Date: 11/19/2020 Time: 4481-8563 SLP Time Calculation (min) (ACUTE ONLY): 17 min  Assessment / Plan / Recommendation Clinical Impression  ST followed his PT/OT session with pt sitting in chair. Significant chest congestion and frequent coughing at rest. He is awake, drowsy and more confused this morning. Removed mild-mod amount of hard palate adhered debris. He consumed approximately 5-6 teaspoon presentation trials of nectar thick apple juice. The throat clears and coughing continued as noted prior but did not increase in severity or frequency. His reflexive and volitional cough is strong and able to mobilize mucous for oral suctioning. Pt was encouraged to continue to cough and expel.  Spoke with RN who was with pt this weekend. She reports she did not give him nectar thick trials as recommended due to his cough and congestion. As staff feels comfortable and per pt's alertness could provide several teaspoon nectar trials. Will continue to follow and make recommendations based on his abilities and medical stability.    HPI HPI: Pt is a 75 yo male s/p anterior cervical discetomy at C3/4 and C5/6 following removal of existing plate, admitted afterwards for sudden onset weakness and pain in his LUE. CT unremarkable; MRI pending. On 12/11 he was made NPO for difficulty swallowing. On 12/11 he underwent C3-6 posterior decompressive laminectomies during which he was intubated. PMH includes: thyroid ca, prostate ca, HTN, HLD, dyspnea, depression, COPD, CHF, anxiety. MBS 11/01/20 recommending Dys 1(puree) and pudding thick liqui. Following day had respiratory decline requiring Bipap until 12/23 when he was intubated and extubated 12/29. New order placed for swallow assessment (considered treat since already on caseload).      SLP Plan  Continue with current plan of care        Recommendations  Diet recommendations: Other(comment) (nectar from floor stock via tsp several times a day) Liquids provided via: Teaspoon Medication Administration: Via alternative means                Oral Care Recommendations: Oral care QID Follow up Recommendations: Skilled Nursing facility SLP Visit Diagnosis: Dysphagia, pharyngoesophageal phase (R13.14) Plan: Continue with current plan of care                       Royce Macadamia 11/19/2020, 11:25 AM   Breck Coons Lonell Face.Ed Nurse, children's [phone] Office [phone]

## 2020-11-19 NOTE — Progress Notes (Signed)
Inpatient Rehabilitation-Admissions Coordinator   Spoke with pt's sister, Harriett Sine, regarding CIR program and support at [place]. Per Harriett Sine, the patient was in need for 24/7 A at home PTA and was not faring well at home in his current state. As the patient does not have 24/7 physical assist at DC, anticipate he will need a longer term, slower paced program, such as a SNF. Pt's sister is in agreement. AC will not pursue CIR at this time and will sign off. Will notify TOC team.   Cheri Rous, OTR/L  Rehab Admissions Coordinator  [phone] 11/19/2020 1:19 PM

## 2020-11-19 NOTE — Progress Notes (Signed)
PROGRESS NOTE  [name] [name] LOV:[id number] DOB: 04-11-1946 DOA: 10/26/2020 PCP: Marguarite Arbour, MD  Brief History    [name] [name] is a 75 y.o. year old male with medical history significant for OSA on NIV at home, cervical stenosis admitted on 12/10 for ACDF.  Hospital course was complicated by postoperative left extremity weakness requiring posterior cervical laminectomy on 12/11, postoperative hypoxemia complicated by aspiration pneumonia (treated with Unasyn 12/12-12/17,, mucous plugging requiring BiPAP, systemic steroids worsening bronchospasms and frequent suctioning while awaiting improvement in cough strength with assistance from PCCM was transferred to progressive unit under care of neurosurgical services with [facility] hospital[facility] asked to consult for medical comorbidities on 11/18/2020.  Procedures  . ACDF 12/10 . Posterior cervical laminectomy 12/11  Antibiotics   Anti-infectives (From admission, onward)   Start     Dose/Rate Route Frequency Ordered Stop   11/13/20 1330  fluconazole (DIFLUCAN) IVPB 400 mg  Status:  Discontinued        400 mg 100 mL/hr over 120 Minutes Intravenous Every 24 hours 11/13/20 1230 11/14/20 1626   11/07/20 1700  ceFEPIme (MAXIPIME) 2 g in sodium chloride 0.9 % 100 mL IVPB        2 g 200 mL/hr over 30 Minutes Intravenous Every 8 hours 11/07/20 0935 11/13/20 1843   11/04/20 1000  cefTRIAXone (ROCEPHIN) 2 g in sodium chloride 0.9 % 100 mL IVPB  Status:  Discontinued        2 g 200 mL/hr over 30 Minutes Intravenous Every 24 hours 11/04/20 0903 11/07/20 0933   10/28/20 2100  Ampicillin-Sulbactam (UNASYN) 3 g in sodium chloride 0.9 % 100 mL IVPB  Status:  Discontinued        3 g 200 mL/hr over 30 Minutes Intravenous Every 6 hours 10/28/20 1917 11/02/20 1019   10/27/20 1707  ceFAZolin (ANCEF) 2-4 GM/100ML-% IVPB       Note to Pharmacy: Shireen Quan   : cabinet override      10/27/20 1707 10/28/20 0514   10/27/20 1700  ceFAZolin (ANCEF) IVPB 2g/100 mL  premix  Status:  Discontinued        2 g 200 mL/hr over 30 Minutes Intravenous  Once 10/27/20 1658 10/27/20 2224    .   Subjective  The patient is resting comfortably. No new complaints.  Objective   Vitals:  Vitals:   11/19/20 1200 11/19/20 1534  BP: (!) 94/52 129/65  Pulse: 65 66  Resp: 20 16  Temp: 97.7 F (36.5 C) 97.6 F (36.4 C)  SpO2:  100%  Exam:  Constitutional:  . The patient is awake, alert, but somewhat confused. No acute distress. Respiratory:  . No increased work of breathing. . No wheezes, rales, or rhonchi . No tactile fremitus . Diminished breath sounds bilaterally. Cardiovascular:  . Regular rate and rhythm . No murmurs, ectopy, or gallups. . No lateral PMI. No thrills. Abdomen:  . Abdomen is soft, non-tender, non-distended . No hernias, masses, or organomegaly . Normoactive bowel sounds.  Musculoskeletal:  . No cyanosis, clubbing, or edema Skin:  . No rashes, lesions, ulcers . palpation of skin: no induration or nodules Neurologic:  . CN 2-12 intact . Sensation all 4 extremities intact Psychiatric:  . Mental status o Mood, affect appropriate o Orientation to person, place, time  . judgment and insight appear intact  I have personally reviewed the following:   Today's Data  . Vitals, BMP  Micro Data  . Respiratory culture: No growth  Imaging  .  CXR 11/15/2020 . Renal ultrasound  Scheduled Meds: . arformoterol  15 mcg Nebulization BID  . atorvastatin  40 mg Per Tube Daily  . chlorhexidine  15 mL Mouth Rinse BID  . Chlorhexidine Gluconate Cloth  6 each Topical Daily  . cholecalciferol  1,000 Units Per Tube Daily  . collagenase   Topical Daily  . docusate  100 mg Per Tube BID  . feeding supplement (PROSource TF)  90 mL Per Tube BID  . FLUoxetine  40 mg Per Tube Daily  . gabapentin  300 mg Per Tube Q8H  . guaiFENesin  15 mL Per Tube Q4H  . heparin injection (subcutaneous)  5,000 Units Subcutaneous Q8H  . insulin aspart  0-20  Units Subcutaneous Q4H  . insulin aspart  6 Units Subcutaneous TID WC  . insulin detemir  30 Units Subcutaneous BID  . isosorbide dinitrate  10 mg Per Tube TID  . levothyroxine  175 mcg Per Tube QAC breakfast  . mouth rinse  15 mL Mouth Rinse q12n4p  . multivitamin with minerals  1 tablet Per Tube Daily  . polyethylene glycol  17 g Per Tube Daily  . QUEtiapine  50 mg Per Tube BID  . revefenacin  175 mcg Nebulization Daily  . sodium chloride flush  10-40 mL Intracatheter Q12H  . traZODone  100 mg Per Tube QHS   Continuous Infusions: . sodium chloride 10 mL/hr at 11/17/20 0600  . feeding supplement (PIVOT 1.5 CAL) 1,000 mL (11/19/20 1326)    Active Problems:   Acute respiratory failure (HCC)   Acute exacerbation of chronic obstructive pulmonary disease (COPD) (HCC)   Major depressive disorder   Hypothyroidism   OSA (obstructive sleep apnea)   HTN (hypertension)   HLD (hyperlipidemia)   CHF (congestive heart failure) (HCC)   Left cervical radiculopathy   Acute cervical radiculopathy   Pressure injury of skin   HCAP (healthcare-associated pneumonia)   Mucus plugging of bronchi   Esophageal dysphagia   Elevated BUN   AKI (acute kidney injury) ([place])   LOS: 24 days    A & P   Acute on chronic hypoxemic/hypercarbic respiratory failure, multifactorial etiology, improving.Marland Kitchen  COPD exacerbation resolved.  Completed antibiotic course for Proteus vulgaris pneumonia.  Still high aspiration risk given dysphagia and hospital-acquired delirium further complicated by mucous plugging and atelectasis. Currently the patient is saturating 100% on 6 L . He will continue to receive supportive care with flutter valve, incentive spirometry, scheduled inhalers. He is also to receive chest physiotherapy, suctioning, continue O2 goal SPO2 greater than 88  Nonoliguric AKI. Resolved with IV fljuids.  Suspect prerenal in the setting of some recent low BP (nadir of 87/52 on 1/1) with hardly any p.o. intake  though patient is receiving tube feeds complicated by IV lasix use for his CHF as well as home losartan  Baseline creatinine 0.8-1.1, currently 0.79 with a BUN of 70.  Normal bladder with no hydronephrosis on renal ultrasound suggesting medical renal disease based off echogenicity.  UA unremarkableContinue to monitor creatinine, electrolytes, and volume status. Avoid nephrotoxins and hypotension.  Encephalopathy, most likely delirium hospital-acquired, improving. Alert and oriented to self and place this morning, but a little confused. Continue delirium precautions including frequent orientation, minimizing sedating medications. Continue scheduled Seroquel and trazodone, Prozac  Hypothyroidism: Continue Synthroid  Chronic diastolic CHF: Noted. Compensated. The patient appears euvolemic. Monitor volume status. Hold diuretics for now.   HTN: Currently normotensive.  Did have nadir of 80s over 50s on 1/1. Home  losartan held given AKI. Verapamil not ordered on admission. Monitor.  HLD: Continue statin as at home.  Type 2 diabetes:  A1c 6.9.  CBGs currently at goal. The patient is receivingLevemir 30 units sq daily with SSI per FSBS on Q4 hr schedule as he is currently receiving tube feeds via cortrack.  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  Status post modified barium swallow evaluation by speech.  Patient reports having prior issues with history of esophageal dilation in the past. The patient is full assist for feeding, nectar thick liquids twice a day. He is eceiving tube feeds via core track. May warrant further evaluation with EGD if symptoms persist.  OSA: Continue Nightly BiPAP  Leukocytosis:  stable.  Currently 17.8, remains afebrile. Monitor CBC.  Chronic normocytic anemia, stable.  Hemoglobin stable at baseline. Monitor CBC.   Status post ACDF status post laminectomy: Postoperative management per primary (NSG).  I  have seen and examined this patient myself. I have spent 35 minutes in his evaluation and care.   Carleena Mires, DO [facility] Direct contact: see [email or web address]  7PM-7AM contact night coverage as above 11/19/2020, 7:53 PM  LOS: 24 days

## 2020-11-19 NOTE — Progress Notes (Signed)
Inpatient Rehabilitation-Admissions Coordinator   Still trying  to get in touch with pt's family to discuss recommended rehab program. Will continue attempts.   Cheri Rous, OTR/L  Rehab Admissions Coordinator  [phone] 11/19/2020 12:18 PM\

## 2020-11-19 NOTE — Consult Note (Signed)
WOC Nurse wound follow up Wound type:Unstageable pressure injury to bridge of nose from previous biPAP use. Measurement: 2.6cm x 3cm Wound JZP:HXTAV bed obscured by the presence of nonviable tissue (eschar), 100% Drainage (amount, consistency, odor) light yellow exudate attributed to enzymatic debriding agent Periwound:intact, moist Dressing procedure/placement/frequency: Current POC is to apply collagenase (Santyl) ointment to the eschar daily and top with a saline moistened gauze 2x2. Securement is with a silicone backed strip foam bandage.   Recommend consultation with Plastic Surgery for endorsement of current POC or recommendations for an alternative course. Additionally they may wish to follow patient in the post acute setting.  If you agree, please order/arrange consultation.  The above is communicated to Dr. Ronaldo Miyamoto via Secure Chat.  WOC nursing team will follow, and will remain available to this patient, the nursing and medical teams.   Thanks, Ladona Mow, MSN, RN, GNP, [place]  Pager# [phone]

## 2020-11-19 NOTE — Progress Notes (Signed)
Physical Therapy Treatment Patient Details Name: [name] [name] MRN: [id number] DOB: [birth year] Today's Date: 11/19/2020    History of Present Illness Patient is a 75 y/o male who presents s/p C3-4, 4-5 ACDF 12/10 with post op LUE weakness requiring C3-6 posterior decompressive lami [id number] complicated by post op hypoxemia. Reintubated on 12/23-12/29. PMH includes COPD, prostata CA, HTN, depression, CHF.    PT Comments    Pt presenting with more confusion vs previous sessions, with emotions ranging from crying, to telling jokes, to singing. Pt also with tangential, garbled speech that was hard to follow. Pt requiring max-total +2 for bed mobility and transfer to semi-stand position today, PT and OT opted for maximove to recliner for pt safety and to promote pulmonary health OOB. Pt with multiple bouts of coughing today, but unable to bring up and clear secretions. Will continue to follow acutely.     Follow Up Recommendations  CIR;Supervision for mobility/OOB;Supervision/Assistance - 24 hour     Equipment Recommendations  None recommended by PT    Recommendations for Other Services       Precautions / Restrictions Precautions Precautions: Fall;Cervical Precaution Comments: extubated 12/29, on 6LO2 Restrictions Weight Bearing Restrictions: No    Mobility  Bed Mobility Overal bed mobility: Needs Assistance Bed Mobility: Supine to Sit     Supine to sit: Max assist;+2 for physical assistance     General bed mobility comments: max +2 for trunk and LE management, scooting to EOB. Helicopter method (PT supporting trunk, OT supporting pt LEs) utilized to get to EOB, pt unabel to tolerate rolling this day.  Transfers Overall transfer level: Needs assistance Equipment used: Ambulation equipment used;2 person hand held assist Transfers: Sit to/from Stand Sit to Stand: Max assist;+2 physical assistance;From elevated surface         General transfer comment: Max +2 for attempted  stand for power up, rise, steadying, and hip/trunk support via bed pad. Pt unable to come to full standing, tolerated semi-standing position x5 seconds. PT and OT lifted pt to recliner with maximove, to promote pulmonary health and posterior pressure relief.  Ambulation/Gait             General Gait Details: nt   Marine scientist Rankin (Stroke Patients Only)       Balance Overall balance assessment: Needs assistance Sitting-balance support: Single extremity supported;Feet supported Sitting balance-Leahy Scale: Fair Sitting balance - Comments: intermittent posterior truncal support, EOB sitting x10 minutes with lateral leaning bilaterally for lift pad placement Postural control: Posterior lean Standing balance support: During functional activity Standing balance-Leahy Scale: Zero Standing balance comment: max-total +2                            Cognition Arousal/Alertness: Awake/alert Behavior During Therapy: WFL for tasks assessed/performed Overall Cognitive Status: Impaired/Different from baseline Area of Impairment: Attention;Following commands;Awareness;Problem solving;Safety/judgement                 Orientation Level: Disoriented to;Situation;Place Current Attention Level: Sustained Memory: Decreased recall of precautions;Decreased short-term memory Following Commands: Follows one step commands with increased time Safety/Judgement: Decreased awareness of safety;Decreased awareness of deficits Awareness: Emergent Problem Solving: Slow processing;Decreased initiation;Requires verbal cues;Requires tactile cues;Difficulty sequencing General Comments: Pt's speech is difficult to follow, many statements are nonsensical and pt unable to explain the meaning to PT/OT. Pt crying in pain for a few  seconds, then begins singing      Exercises      General Comments General comments (skin integrity, edema, etc.):  6LO2, SpO2 89% and greater. BP 108/62 sitting EOB with complaints of dizziness and nausea, improved with continued sitting.      Pertinent Vitals/Pain Pain Assessment: Faces Faces Pain Scale: Hurts even more Pain Location: neck Pain Descriptors / Indicators: Grimacing;Discomfort;Sore;Crying Pain Intervention(s): Limited activity within patient's tolerance;Monitored during session;Repositioned    Home Living                      Prior Function            PT Goals (current goals can now be found in the care plan section) Acute Rehab PT Goals Patient Stated Goal: home PT Goal Formulation: With patient Time For Goal Achievement: 11/26/20 Potential to Achieve Goals: Fair Progress towards PT goals: Progressing toward goals    Frequency    Min 3X/week      PT Plan Current plan remains appropriate    Co-evaluation PT/OT/SLP Co-Evaluation/Treatment: Yes Reason for Co-Treatment: For patient/therapist safety;To address functional/ADL transfers PT goals addressed during session: Mobility/safety with mobility;Balance        AM-PAC PT "6 Clicks" Mobility   Outcome Measure  Help needed turning from your back to your side while in a flat bed without using bedrails?: A Lot Help needed moving from lying on your back to sitting on the side of a flat bed without using bedrails?: A Lot Help needed moving to and from a bed to a chair (including a wheelchair)?: Total Help needed standing up from a chair using your arms (e.g., wheelchair or bedside chair)?: Total Help needed to walk in hospital room?: Total Help needed climbing 3-5 steps with a railing? : Total 6 Click Score: 8    End of Session Equipment Utilized During Treatment: Oxygen;Gait belt Activity Tolerance: Patient limited by fatigue Patient left: with call bell/phone within reach;in chair;with chair alarm set Nurse Communication: Mobility status;Need for lift equipment;Other (comment) (spoke with NT) PT Visit  Diagnosis: Other abnormalities of gait and mobility (R26.89);Muscle weakness (generalized) (M62.81)     Time: 1024-1100 PT Time Calculation (min) (ACUTE ONLY): 36 min  Charges:  $Therapeutic Activity: 8-22 mins                    Eydie Wormley S, PT Acute Rehabilitation Services Pager [phone]  Office [phone]  Dyesha Henault E Stroup 11/19/2020, 12:00 PM

## 2020-11-19 NOTE — Progress Notes (Signed)
Occupational Therapy Treatment Patient Details Name: [name] [name] MRN: [id number] DOB: 04-25-1946 Today's Date: 11/19/2020    History of present illness Patient is a 75 y/o male who presents s/p C3-4, 4-5 ACDF 12/10 with post op LUE weakness requiring C3-6 posterior decompressive lami [id number] complicated by post op hypoxemia. Reintubated on 12/23-12/29. PMH includes COPD, prostata CA, HTN, depression, CHF.   OT comments  Patient continues to make steady progress towards goals in skilled OT session. Patient's session encompassed co-treat with PT in order to address functional deficits. Pt significantly limited by pain and confusion in session, being oriented one minute, crying in pain the next, and then singing or speaking another language to answer therapist's questions. Due to increased pain and need for max-total A of two to come to EOB and attempt one sit<>stand transfer, pt placed in lift pad on EOB and transferred with maximove to chair. Discharge remains appropriate, therapy will continue to follow.    Follow Up Recommendations  CIR;Supervision/Assistance - 24 hour    Equipment Recommendations  3 in 1 bedside commode    Recommendations for Other Services      Precautions / Restrictions Precautions Precautions: Fall;Cervical Precaution Comments: extubated 12/29, on 6LO2 Restrictions Weight Bearing Restrictions: No       Mobility Bed Mobility Overal bed mobility: Needs Assistance Bed Mobility: Supine to Sit     Supine to sit: Max assist;+2 for physical assistance     General bed mobility comments: max +2 for trunk and LE management, scooting to EOB. Helicopter method (PT supporting trunk, OT supporting pt LEs) utilized to get to EOB, pt unabel to tolerate rolling this day.  Transfers Overall transfer level: Needs assistance Equipment used: Ambulation equipment used;2 person hand held assist Transfers: Sit to/from Stand Sit to Stand: Max assist;+2 physical assistance;From  elevated surface         General transfer comment: Max +2 for attempted stand for power up, rise, steadying, and hip/trunk support via bed pad. Pt unable to come to full standing, tolerated semi-standing position x5 seconds. PT and OT lifted pt to recliner with maximove, to promote pulmonary health and posterior pressure relief.    Balance Overall balance assessment: Needs assistance Sitting-balance support: Single extremity supported;Feet supported Sitting balance-Leahy Scale: Fair Sitting balance - Comments: intermittent posterior truncal support, EOB sitting x10 minutes with lateral leaning bilaterally for lift pad placement Postural control: Posterior lean Standing balance support: During functional activity Standing balance-Leahy Scale: Zero Standing balance comment: max-total +2                           ADL either performed or assessed with clinical judgement   ADL Overall ADL's : Needs assistance/impaired     Grooming: Minimal assistance;Sitting Grooming Details (indicate cue type and reason): managing secretions             Lower Body Dressing: Total assistance;Bed level               Functional mobility during ADLs: Maximal assistance;+2 for physical assistance;+2 for safety/equipment General ADL Comments: Focused session on sitting balance and activity tolerance and following commands     Vision       Perception     Praxis      Cognition Arousal/Alertness: Awake/alert Behavior During Therapy: WFL for tasks assessed/performed Overall Cognitive Status: Impaired/Different from baseline Area of Impairment: Attention;Following commands;Awareness;Problem solving;Safety/judgement  Orientation Level: Disoriented to;Situation;Place Current Attention Level: Sustained Memory: Decreased recall of precautions;Decreased short-term memory Following Commands: Follows one step commands with increased time Safety/Judgement:  Decreased awareness of safety;Decreased awareness of deficits Awareness: Emergent Problem Solving: Slow processing;Decreased initiation;Requires verbal cues;Requires tactile cues;Difficulty sequencing General Comments: Pt's speech is difficult to follow, many statements are nonsensical and pt unable to explain the meaning to PT/OT. Pt crying in pain for a few seconds, then begins singing        Exercises     Shoulder Instructions       General Comments 6LO2, SpO2 89% and greater. BP 108/62 sitting EOB with complaints of dizziness and nausea, improved with continued sitting.    Pertinent Vitals/ Pain       Pain Assessment: Faces Faces Pain Scale: Hurts even more Pain Location: neck Pain Descriptors / Indicators: Grimacing;Discomfort;Sore;Crying Pain Intervention(s): Limited activity within patient's tolerance;Monitored during session;Repositioned  Home Living                                          Prior Functioning/Environment              Frequency  Min 2X/week        Progress Toward Goals  OT Goals(current goals can now be found in the care plan section)  Progress towards OT goals: Progressing toward goals  Acute Rehab OT Goals Patient Stated Goal: home OT Goal Formulation: With patient Time For Goal Achievement: 11/29/20 Potential to Achieve Goals: Fair  Plan Discharge plan remains appropriate    Co-evaluation      Reason for Co-Treatment: For patient/therapist safety;To address functional/ADL transfers PT goals addressed during session: Mobility/safety with mobility;Balance OT goals addressed during session: ADL's and self-care      AM-PAC OT "6 Clicks" Daily Activity     Outcome Measure   Help from another person eating meals?: Total Help from another person taking care of personal grooming?: A Lot Help from another person toileting, which includes using toliet, bedpan, or urinal?: Total Help from another person bathing  (including washing, rinsing, drying)?: Total Help from another person to put on and taking off regular upper body clothing?: Total Help from another person to put on and taking off regular lower body clothing?: Total 6 Click Score: 7    End of Session Equipment Utilized During Treatment: Oxygen;Other (comment);Gait belt (maxi sky)  OT Visit Diagnosis: Unsteadiness on feet (R26.81);Other abnormalities of gait and mobility (R26.89);Muscle weakness (generalized) (M62.81);Pain Pain - Right/Left: Left Pain - part of body: Arm   Activity Tolerance Patient limited by fatigue;Patient limited by pain   Patient Left in chair;with call bell/phone within reach;with chair alarm set   Nurse Communication Mobility status;Need for lift equipment        Time: 1024-1100 OT Time Calculation (min): 36 min  Charges: OT General Charges $OT Visit: 1 Visit OT Treatments $Self Care/Home Management : 8-22 mins  Pollyann Glen E. Tisha Cline, COTA/L Acute Rehabilitation Services [phone]   Cherlyn Cushing 11/19/2020, 2:17 PM

## 2020-11-19 NOTE — Progress Notes (Signed)
Patient ID: [name] [name], male   DOB: 05-17-1946, 75 y.o.   MRN: [id number] BP 129/65 (BP Location: Left Arm)   Pulse 66   Temp 97.6 F (36.4 C) (Oral)   Resp 16   Ht 6\' 2"  (1.88 m)   Wt 111 kg   SpO2 100%   BMI 31.42 kg/m  Alert, oriented x 4, speech is mildly slurred Follows all commands  Moving all extremities Will consult plastic surgery for nasal bridge wound

## 2020-11-20 ENCOUNTER — Encounter (HOSPITAL_COMMUNITY): Payer: Self-pay | Admitting: Neurosurgery

## 2020-11-20 ENCOUNTER — Inpatient Hospital Stay (HOSPITAL_COMMUNITY): Payer: Medicare Other

## 2020-11-20 DIAGNOSIS — J9601 Acute respiratory failure with hypoxia: Secondary | ICD-10-CM | POA: Diagnosis not present

## 2020-11-20 DIAGNOSIS — R0902 Hypoxemia: Secondary | ICD-10-CM | POA: Diagnosis not present

## 2020-11-20 DIAGNOSIS — J690 Pneumonitis due to inhalation of food and vomit: Secondary | ICD-10-CM | POA: Diagnosis not present

## 2020-11-20 DIAGNOSIS — R1319 Other dysphagia: Secondary | ICD-10-CM | POA: Diagnosis not present

## 2020-11-20 DIAGNOSIS — J189 Pneumonia, unspecified organism: Secondary | ICD-10-CM | POA: Diagnosis not present

## 2020-11-20 LAB — GLUCOSE, CAPILLARY
Glucose-Capillary: 128 mg/dL — ABNORMAL HIGH (ref 70–99)
Glucose-Capillary: 114 mg/dL — ABNORMAL HIGH (ref 70–99)
Glucose-Capillary: 112 mg/dL — ABNORMAL HIGH (ref 70–99)
Glucose-Capillary: 128 mg/dL — ABNORMAL HIGH (ref 70–99)

## 2020-11-20 MED ORDER — SODIUM BICARBONATE 650 MG PO TABS
650.0000 mg | ORAL_TABLET | Freq: Once | ORAL | Status: AC
  Administered 2020-11-20: 650 mg
  Filled 2020-11-20: qty 1

## 2020-11-20 MED ORDER — PANCRELIPASE (LIP-PROT-AMYL) 10440-39150 UNITS PO TABS
20880.0000 [IU] | ORAL_TABLET | Freq: Once | ORAL | Status: AC
  Administered 2020-11-20: 20880 [IU]
  Filled 2020-11-20: qty 2

## 2020-11-20 MED ORDER — RESOURCE THICKENUP CLEAR PO POWD
ORAL | Status: DC | PRN
  Filled 2020-11-20: qty 125

## 2020-11-20 NOTE — Plan of Care (Signed)

## 2020-11-20 NOTE — TOC Transition Note (Signed)
Transition of Care [facility]) - CM/SW Discharge Note   Patient Details  Name: [name] [name] MRN: [id number] Date of Birth: 09-Apr-1946  [facility]) CM/SW Contact:  Eduard Roux, LCSWA Phone Number: 11/20/2020, 6:16 PM   Clinical Narrative:     CSW  Received call from patient's sister,Nacy- preferred SNF is [facility], [address] [facility] ([place]  CSW will continue to follow and assist with discharge planning.  Antony Blackbird, MSW, LCSW Clinical Social Worker     Barriers to Discharge: Continued Medical Work up   Patient Goals and CMS Choice        Discharge Placement                       Discharge Plan and Services   Discharge Planning Services: CM Consult                                 Social Determinants of Health (SDOH) Interventions     Readmission Risk Interventions No flowsheet data found.

## 2020-11-20 NOTE — Progress Notes (Signed)
PROGRESS NOTE  [name] [name] RUE:[id number] DOB: 07/01/1946 DOA: 10/26/2020 PCP: Marguarite Arbour, MD  Brief History    [name] [name] is a 75 y.o. year old male with medical history significant for OSA on NIV at home, cervical stenosis admitted on 12/10 for ACDF.  Hospital course was complicated by postoperative left extremity weakness requiring posterior cervical laminectomy on 12/11, postoperative hypoxemia complicated by aspiration pneumonia (treated with Unasyn 12/12-12/17,, mucous plugging requiring BiPAP, systemic steroids worsening bronchospasms and frequent suctioning while awaiting improvement in cough strength with assistance from PCCM was transferred to progressive unit under care of neurosurgical services with [facility] asked to consult for medical comorbidities on 11/18/2020.  Pt continues to have issues with dysphagia and is dependent upon tube feeds. Although it is likely that the patient's recent cervical procedures will preclude EGD at this point, I have consulted GI to evaluate the patient. At their request DG esophagram ordered.  Procedures  . ACDF 12/10 . Posterior cervical laminectomy 12/11  Antibiotics   Anti-infectives (From admission, onward)   Start     Dose/Rate Route Frequency Ordered Stop   11/13/20 1330  fluconazole (DIFLUCAN) IVPB 400 mg  Status:  Discontinued        400 mg 100 mL/hr over 120 Minutes Intravenous Every 24 hours 11/13/20 1230 11/14/20 1626   11/07/20 1700  ceFEPIme (MAXIPIME) 2 g in sodium chloride 0.9 % 100 mL IVPB        2 g 200 mL/hr over 30 Minutes Intravenous Every 8 hours 11/07/20 0935 11/13/20 1843   11/04/20 1000  cefTRIAXone (ROCEPHIN) 2 g in sodium chloride 0.9 % 100 mL IVPB  Status:  Discontinued        2 g 200 mL/hr over 30 Minutes Intravenous Every 24 hours 11/04/20 0903 11/07/20 0933   10/28/20 2100  Ampicillin-Sulbactam (UNASYN) 3 g in sodium chloride 0.9 % 100 mL IVPB  Status:  Discontinued        3 g 200 mL/hr over 30  Minutes Intravenous Every 6 hours 10/28/20 1917 11/02/20 1019   10/27/20 1707  ceFAZolin (ANCEF) 2-4 GM/100ML-% IVPB       Note to Pharmacy: Shireen Quan   : cabinet override      10/27/20 1707 10/28/20 0514   10/27/20 1700  ceFAZolin (ANCEF) IVPB 2g/100 mL premix  Status:  Discontinued        2 g 200 mL/hr over 30 Minutes Intravenous  Once 10/27/20 1658 10/27/20 2224      Subjective  The patient is resting comfortably. No new complaints.  Objective   Vitals:  Vitals:   11/20/20 0819 11/20/20 1221  BP:  129/77  Pulse:  71  Resp:  18  Temp:  97.9 F (36.6 C)  SpO2: 100% 98%  Exam:  Constitutional:  . The patient is awake and alert.  No acute distress. Family at bedside. Respiratory:  . No increased work of breathing. . No wheezes, rales, or rhonchi . No tactile fremitus . Diminished breath sounds bilaterally. Cardiovascular:  . Regular rate and rhythm . No murmurs, ectopy, or gallups. . No lateral PMI. No thrills. Abdomen:  . Abdomen is soft, non-tender, non-distended . No hernias, masses, or organomegaly . Normoactive bowel sounds.  Musculoskeletal:  . No cyanosis, clubbing, or edema Skin:  . No rashes, lesions, ulcers . palpation of skin: no induration or nodules Neurologic:  . CN 2-12 intact . Sensation all 4 extremities intact Psychiatric:  . Mental status o Mood, affect appropriate  o Orientation to person, place, time  . judgment and insight appear intact  I have personally reviewed the following:   Today's Data  . Vitals, BMP  Micro Data  . Respiratory culture: No growth  Imaging  . CXR 11/15/2020 . Renal ultrasound  Scheduled Meds: . arformoterol  15 mcg Nebulization BID  . atorvastatin  40 mg Per Tube Daily  . chlorhexidine  15 mL Mouth Rinse BID  . Chlorhexidine Gluconate Cloth  6 each Topical Daily  . cholecalciferol  1,000 Units Per Tube Daily  . collagenase   Topical Daily  . docusate  100 mg Per Tube BID  . feeding supplement  (PROSource TF)  90 mL Per Tube BID  . FLUoxetine  40 mg Per Tube Daily  . gabapentin  300 mg Per Tube Q8H  . guaiFENesin  15 mL Per Tube Q4H  . heparin injection (subcutaneous)  5,000 Units Subcutaneous Q8H  . insulin aspart  0-20 Units Subcutaneous Q4H  . insulin aspart  6 Units Subcutaneous TID WC  . insulin detemir  30 Units Subcutaneous BID  . isosorbide dinitrate  10 mg Per Tube TID  . levothyroxine  175 mcg Per Tube QAC breakfast  . mouth rinse  15 mL Mouth Rinse q12n4p  . multivitamin with minerals  1 tablet Per Tube Daily  . polyethylene glycol  17 g Per Tube Daily  . QUEtiapine  50 mg Per Tube BID  . revefenacin  175 mcg Nebulization Daily  . sodium chloride flush  10-40 mL Intracatheter Q12H  . traZODone  100 mg Per Tube QHS   Continuous Infusions: . sodium chloride 10 mL/hr at 11/17/20 0600  . feeding supplement (PIVOT 1.5 CAL) 1,000 mL (11/19/20 1326)    Active Problems:   Acute respiratory failure (HCC)   Acute exacerbation of chronic obstructive pulmonary disease (COPD) (HCC)   Major depressive disorder   Hypothyroidism   OSA (obstructive sleep apnea)   HTN (hypertension)   HLD (hyperlipidemia)   CHF (congestive heart failure) (HCC)   Left cervical radiculopathy   Acute cervical radiculopathy   Pressure injury of skin   HCAP (healthcare-associated pneumonia)   Mucus plugging of bronchi   Esophageal dysphagia   Elevated BUN   AKI (acute kidney injury) ([place])   LOS: 25 days    A & P   Acute on chronic hypoxemic/hypercarbic respiratory failure, multifactorial etiology, improving.Marland Kitchen  COPD exacerbation resolved.  Completed antibiotic course for Proteus vulgaris pneumonia.  Still high aspiration risk given dysphagia and hospital-acquired delirium further complicated by mucous plugging and atelectasis. Currently the patient is saturating 100% on 5 L . He will continue to receive supportive care with flutter valve, incentive spirometry, scheduled inhalers. He is also  to receive chest physiotherapy, suctioning, continue O2 goal SPO2 greater than 88.  Nonoliguric AKI. Resolved with IV fljuids.  Suspect prerenal in the setting of some recent low BP (nadir of 87/52 on 1/1) with hardly any p.o. intake though patient is receiving tube feeds complicated by IV lasix use for his CHF as well as home losartan  Baseline creatinine 0.8-1.1, currently 0.79 with a BUN of 70.  Normal bladder with no hydronephrosis on renal ultrasound suggesting medical renal disease based off echogenicity.  UA unremarkableContinue to monitor creatinine, electrolytes, and volume status. Avoid nephrotoxins and hypotension.  Dysphagia: Concern for achalasia vs esophageal stricture. 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  Status post modified barium swallow evaluation by speech. The patient is  full assist for feeding, nectar thick liquids twice a day. The patient has a known history of stricture s/p unsuccessful dilatation. Pt continues to have issues with dysphagia and is dependent upon tube feeds post operatively. Although it is likely that the patient's recent cervical procedures will preclude EGD at this point, I have consulted GI to evaluate the patient. At their request DG esophagram ordered.  Encephalopathy: Improving daily. Alert and oriented to self and place this morning, but a little confused. Continue delirium precautions including frequent orientation, minimizing sedating medications. Continue scheduled Seroquel and trazodone, Prozac  Hypothyroidism: Continue Synthroid  Chronic diastolic CHF: Noted. Compensated. The patient appears euvolemic. Monitor volume status. Hold diuretics for now.   HTN: Currently normotensive.  Did have nadir of 80s over 50s on 1/1. Home losartan held given AKI. Verapamil not ordered on admission. Monitor.  HLD: Continue statin as at home.  Type 2 diabetes:  A1c 6.9.  CBGs currently at goal. The  patient is receivingLevemir 30 units sq daily with SSI per FSBS on Q4 hr schedule as he is currently receiving tube feeds via cortrack.  OSA: Continue Nightly BiPAP  Leukocytosis:  stable.  Currently 17.8, remains afebrile. Monitor CBC.  Chronic normocytic anemia, stable.  Hemoglobin stable at baseline. Monitor CBC.   Status post ACDF status post laminectomy: Postoperative management per primary (NSG).  I have seen and examined this patient myself. I have spent 38 minutes in his evaluation and care.   [name] Dafoe, DO [facility] Direct contact: see [email or web address]  7PM-7AM contact night coverage as above 11/19/2020, 7:53 PM  LOS: 24 days

## 2020-11-20 NOTE — Progress Notes (Signed)
Patient ID: [name] [name], male   DOB: October 04, 1946, 75 y.o.   MRN: [id number] BP (!) 162/77 (BP Location: Right Arm)   Pulse 68   Temp 98 F (36.7 C) (Oral)   Resp 14   Ht 6\' 2"  (1.88 m)   Wt 111 kg   SpO2 99%   BMI 31.42 kg/m  Alert, and oriented, moving all extremities well Wounds are clean, dry, and without signs of infection Continuing to improve, diet has been approved.

## 2020-11-20 NOTE — Progress Notes (Signed)
Cortrack clogged and unclogging was not successful. Contacted provider and an order for NG placement was obtained. This nurse attempted to place through the left nare but was unsuccessful. AD placed through right nare.Pt tolerated fair. Placement auscultated at the time and an xray was obtained to verify placement. Awaiting these results. Mayford Knife RN

## 2020-11-20 NOTE — TOC Initial Note (Signed)
Transition of Care [facility]) - Initial/Assessment Note    Patient Details  Name: [name] [name] MRN: [id number] Date of Birth: [birth year]  Transition of Care [facility]) CM/SW Contact:    Vinie Sill, [place] Phone Number: 11/20/2020, 11:59 AM  Clinical Narrative:                  CSW spoke with the patient's sister,[name], by phone. CSW introduced self and explained role. CSW discussed short term rehab at [facility]. Patient's sister expressed the goal is for the patient to good well enough to return home. She states he has a caregiver in the home 5 days per week(his friends). CSW explained the SNF process. She is agreeable to short term rehab at [facility].  Her preference for SNF is in the [facility] area.  She states she will follow up on some SNF in their county and contact CSW with the their names. No further questions or concerns noted at this time.  CSW will send referrals once patient no longer needs cotrak and closer to medical readiness for discharge to SNF CSW will continue to follow and assist with discharge planning.   Thurmond Butts, MSW, LCSW Clinical Social Worker    Expected Discharge Plan: Skilled Nursing Facility Barriers to Discharge: Continued Medical Work up   Patient Goals and CMS Choice        Expected Discharge Plan and Services Expected Discharge Plan: [place]   Discharge Planning Services: CM Consult   Living arrangements for the past 2 months: Single Family Home                                      Prior Living Arrangements/Services Living arrangements for the past 2 months: Single Family Home Lives with:: Other (Comment) (Caretakers) Patient language and need for interpreter reviewed:: Yes Do you feel safe going back to the place where you live?: Yes      Need for Family Participation in Patient Care: Yes (Comment) Care giver support system in place?: Yes (comment)   Criminal Activity/Legal Involvement Pertinent to Current  Situation/Hospitalization: No - Comment as needed  Activities of Daily Living Home Assistive Devices/Equipment: Cane (specify quad or straight) ADL Screening (condition at time of admission) Patient's cognitive ability adequate to safely complete daily activities?: Yes Is the patient deaf or have difficulty hearing?: No Does the patient have difficulty seeing, even when wearing glasses/contacts?: No Does the patient have difficulty concentrating, remembering, or making decisions?: Yes Patient able to express need for assistance with ADLs?: Yes Does the patient have difficulty dressing or bathing?: No Independently performs ADLs?: Yes (appropriate for developmental age) Does the patient have difficulty walking or climbing stairs?: Yes Weakness of Legs: Both Weakness of Arms/Hands: None  Permission Sought/Granted                  Emotional Assessment Appearance:: Appears stated age Attitude/Demeanor/Rapport: Engaged Affect (typically observed): Accepting Orientation: : Oriented to Self,Oriented to Place,Oriented to Situation      Admission diagnosis:  Acute cervical radiculopathy [M54.12] Patient Active Problem List   Diagnosis Date Noted  . Esophageal dysphagia 11/18/2020  . Elevated BUN 11/18/2020  . AKI (acute kidney injury) ([place]) 11/18/2020  . HCAP (healthcare-associated pneumonia)   . Mucus plugging of bronchi   . Left cervical radiculopathy 10/26/2020  . Acute cervical radiculopathy 10/26/2020  . Pressure injury of skin 10/26/2020  . Problems  with swallowing and mastication   . Stricture and stenosis of esophagus   . Hx of colonic polyps   . Polyp of ascending colon   . Bilateral carpal tunnel syndrome 02/13/2020  . 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 Lymphedema 02/04/2019  . Tobacco use 02/03/2019  . CHF (congestive heart failure) (HCC) 12/23/2018  . Acute on chronic diastolic CHF (congestive heart failure) (HCC) 11/27/2018  . Cellulitis 11/27/2018  .  Hypothyroidism 11/27/2018  . OSA (obstructive sleep apnea) 11/27/2018  . HTN (hypertension) 11/27/2018  . HLD (hyperlipidemia) 11/27/2018  . Protein-calorie malnutrition (HCC) 03/02/2018  . Neuropathy 03/02/2018  . 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  . Difficulty in voiding 03/01/2018  . Vitamin D deficiency 03/01/2018  . Vitamin B12 deficiency 03/01/2018  . Insomnia 03/01/2018  . Acute exacerbation of chronic obstructive pulmonary disease (COPD) (HCC) 02/11/2018  . Pain due to onychomycosis of nail 02/11/2018  . Onychomycosis 02/11/2018  . Traumatic ecchymosis of foot, right, initial encounter 02/11/2018  . Xerostomia 02/11/2018  . Frequent falls 02/11/2018  . Acute respiratory failure (HCC) 02/07/2018  . 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

## 2020-11-20 NOTE — Progress Notes (Addendum)
  Speech Language Pathology Treatment: Dysphagia  Patient Details Name: [name] [name] MRN: [id number] DOB: April 18, 1946 Today's Date: 11/20/2020 Time: 1152-1220 SLP Time Calculation (min) (ACUTE ONLY): 28 min  Assessment / Plan / Recommendation Clinical Impression  Bj is alert today, more lucid, decreased congestion and coughing at baseline and reviewed prior MBS, results and recommendations with pt and caregiver. Showed MBS video and discussed barium filled esophagus. He reported to therapist, day of MBS, he had esophageal dilation previously. Oral care removed mild amount of dried mucous. Observed with approximately 2 oz of nectar thick juice and nectar thick green tea with several immediate and delayed throat clears, effortful and multiple swallows. Spoke with Dr. Gerri Lins and possibility of consulting GI for esophageal involvement.  The Masako technique attempted to facilitate pharyngeal wall and tongue base retraction however he could not execute; performed effortful swallows and pitch intonation.  Recommend nectar thick liquids via spoon from floor stock and continue nutrition via Cortrak. ST will continue- it is hopeful he can progress to all po's to avoid longer term source of nutrition.    HPI HPI: Pt is a 75 yo male s/p anterior cervical discetomy at C3/4 and C5/6 following removal of existing plate, admitted afterwards for sudden onset weakness and pain in his LUE. CT unremarkable; MRI pending. On 12/11 he was made NPO for difficulty swallowing. On 12/11 he underwent C3-6 posterior decompressive laminectomies during which he was intubated. PMH includes: thyroid ca, prostate ca, HTN, HLD, dyspnea, depression, COPD, CHF, anxiety. MBS 11/01/20 recommending Dys 1(puree) and pudding thick liqui. Following day had respiratory decline requiring Bipap until 12/23 when he was intubated and extubated 12/29. New order placed for swallow assessment (considered treat since already on caseload).      SLP  Plan  Continue with current plan of care       Recommendations  Diet recommendations: Other(comment) (nectar thick liquid-floor stock) Liquids provided via: Teaspoon Medication Administration: Via alternative means Supervision: Patient able to self feed;Full supervision/cueing for compensatory strategies Compensations: Slow rate;Small sips/bites Postural Changes and/or Swallow Maneuvers: Seated upright 90 degrees;Upright 30-60 min after meal                Oral Care Recommendations: Oral care QID Follow up Recommendations: Skilled Nursing facility SLP Visit Diagnosis: Dysphagia, pharyngoesophageal phase (R13.14) Plan: Continue with current plan of care       GO                Royce Macadamia 11/20/2020, 2:10 PM

## 2020-11-20 NOTE — Consult Note (Signed)
[facility] Consult: 3:00 PM 11/20/2020  LOS: 25 days    Referring Provider: Lessie Dings MD  Primary Care Physician:  Idelle Crouch, MD [facility] Primary Gastroenterologist:  Althia Forts.  Darren Therapist, sports in [place].     Reason for Consultation: Dysphagia.    HPI: [name] [name] is a 75 y.o. male.  PMH OSA.  Hypertension.  Thyroid cancer, status post total thyroidectomy, on Synthroid..  Prostate cancer, robotic prostatectomy  2013. .  CHF.  COPD.  Spinal stenosis.  S/p cervical and lumbar spine surgeries.  GI issues include dysphagia, diverticular bleed, colon polyp, umbilical hernia repair.   Notable colonoscopies, adenomatous colon polyps dating back to at least 2003.  Adenomatous colon polyps in 2016. 04/17/2020 colonoscopy.  For polyp surveillance.  5 polyps removed from various locations throughout the colon, sized 3 to 7 mm.  Sigmoid diverticulosis.  Nonbleeding internal hemorrhoids.  Pathology: sessile serrated, tubular adenoma, prominent lymphoid aggregate, no HGD.   04/17/20 EGD: For dysphagia.  Dr. 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  Plaques in the esophagus suspicious for Candida.  KOH prep confirmed presence of yeast (not treated).  Normal stomach, normal duodenum.   After the June 2021 EGD pt had brief improvement in dysphagia but within a week was back to having to chew his foods to a pured consistency and eat carefully and slowly.  This has become an issue during his hospitalization,  Currently week 4 of admission. 10/26/2020 underwent uncomplicated anterior cervical discectomy at C3/4 and C5/6.   Developed LUE weakness, dysphagia postop and ended up admitted to the hospital.  10/27/2020 underwent C3-6 posterior laminectomies.  Op note mentions excessive thick  calcified ligament at C3 adherent to dura with significant compression.  Quickly developed respiratory failure, hypoxemia, left pleural effusion, atelectasis, PNA, mucous plugging, possible aspiration.  Treated with antibiotic.  CCM involved as of 10/28/2020 initially treated with BiPAP but vent/ETT 12/23 - 12/29 with worsening hypoxia, delirium, fevers. Coretrak FT placed 12/13 remains in place.   MBS 12/16 w silent aspiration, puree/pudding thick liquids recommended.  MBS 11/16/20: Barium filling most of esophagus and never completely emptying despite thinner consistencies with thin and nectar thick substances 1 instance of aspiration thin barium. Latest SLP fup of dysphagia of today 1/3: Frequent coughing, significant chest congestion noted at baseline.  SLP removed moderate amount of adherent debris from the hard palate.  Trial of approximately 6 teaspoons of thickened applesauce resulted in coughing and throat clearing.  Cough is strong.   At this point he is nutritionally supported with core track feeding and allowed limited volumes of thickened liquids via teaspoon bid.  Pt has never had barium esophagram.    Plastic surgery consulted regarding wound at nasal bridge from BiPAP.  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  Lives alone but has 24/7 care at home though he describes being able to walk around the house with a cane, bathe himself and do some cooking PTA.  Smoking 1 pack/day until the time of  this admission.  Rare alcohol.  None for a few months and prior to that a glass of wine weekly at most.     Past Medical History:  Diagnosis Date  . Anxiety disorder 05/05/2014   unspecifed  . CHF (congestive heart failure) ([place])   . Colonic polyp   . COPD (chronic obstructive pulmonary disease) ([place])   . Depression   . Dyspnea   . Elevated prostate specific antigen (PSA)   . Essential (primary) hypertension 05/05/2014  . GI bleed    due  to diverticulitis   . Heart murmur    hx of 1960s  . History of thyroid cancer 05/05/2014  . Hypercholesteremia   . Hyperlipidemia   . Hypertension   . Hypothyroidism 05/05/2014  . Male erectile dysfunction 12/29/2012  . Malignant neoplasm of prostate ([place])    10/30/2011 T1c - Identified by needle biopsy . Gleason 7 (3+4) 5 of 12 cores, Bilateral   . Prediabetes 06/22/2017  . Sleep apnea    CPAP , report on chart , not used in last 2 weeks   . Urinary obstruction    not elsewhere classified    Past Surgical History:  Procedure Laterality Date  . BACK SURGERY     lumbar 1987   . BACK SURGERY     C-6- Plate&Pin  . CATARACT EXTRACTION W/PHACO Right 09/15/2018   Procedure: CATARACT EXTRACTION PHACO AND INTRAOCULAR LENS PLACEMENT ([place]) RIGHT;  Surgeon: Leandrew Koyanagi, MD;  Location: [facility];  Service: Ophthalmology;  Laterality: Right;  Requests to be last case  . CATARACT EXTRACTION W/PHACO Left 10/06/2018   Procedure: CATARACT EXTRACTION PHACO AND INTRAOCULAR LENS PLACEMENT ([place])  LEFT;  Surgeon: Leandrew Koyanagi, MD;  Location: [facility];  Service: Ophthalmology;  Laterality: Left;  . CERVICAL DISC SURGERY    . COLONOSCOPY     10/06/2011, 06/05/2006, 05/06/2002 adematous polyps: CBF 09/2014; Recall Ltr mailed 02/27/2015 (dw)  . COLONOSCOPY WITH PROPOFOL N/A 07/06/2015   Procedure: COLONOSCOPY WITH PROPOFOL;  Surgeon: Manya Silvas, MD;  Location: [facility] ENDOSCOPY;  Service: Endoscopy;  Laterality: N/A;  . COLONOSCOPY WITH PROPOFOL N/A 09/01/2018   Procedure: COLONOSCOPY WITH PROPOFOL;  Surgeon: Manya Silvas, MD;  Location: [facility] ENDOSCOPY;  Service: Endoscopy;  Laterality: N/A;  . COLONOSCOPY WITH PROPOFOL N/A 04/17/2020   Procedure: COLONOSCOPY WITH PROPOFOL;  Surgeon: Lucilla Lame, MD;  Location: [facility] ENDOSCOPY;  Service: Endoscopy;  Laterality: N/A;  . ESOPHAGOGASTRODUODENOSCOPY (EGD) WITH PROPOFOL N/A 04/17/2020   Procedure:  ESOPHAGOGASTRODUODENOSCOPY (EGD) WITH PROPOFOL;  Surgeon: Lucilla Lame, MD;  Location: [facility] ENDOSCOPY;  Service: Endoscopy;  Laterality: N/A;  . EYE SURGERY    . HERNIA REPAIR     umbilical hernia repair   . LAMINECTOMY FOR EXCISION / EVACUATION INTRASPINAL LESION     lumbar  . OTHER SURGICAL HISTORY     right rotator cuff surgery   . OTHER SURGICAL HISTORY     bilateral tubes in ears   . POSTERIOR CERVICAL LAMINECTOMY N/A 10/27/2020   Procedure: POSTERIOR CERVICAL LAMINECTOMY CERVICAL THREE-CERVICAL SIX;  Surgeon: Judith Part, MD;  Location: [facility];  Service: Neurosurgery;  Laterality: N/A;  . POSTERIOR LAMINECTOMY / DECOMPRESSION CERVICAL SPINE    . PROSTATE BIOPSY     10/22/2011 Volume:55.8 cc's, PSA:4.5, Free PSA:12%  . ROBOT ASSISTED LAPAROSCOPIC RADICAL PROSTATECTOMY  12/15/2011   Procedure: ROBOTIC ASSISTED LAPAROSCOPIC RADICAL PROSTATECTOMY LEVEL 2;  Surgeon: Dutch Gray, MD;  Location: [facility];  Service: Urology;  Laterality: N/A;      .  ROTATOR CUFF REPAIR Right   . TONSILLECTOMY    . TOTAL THYROIDECTOMY      Prior to Admission medications   Medication Sig Start Date End Date Taking? Authorizing Provider  budesonide-formoterol (SYMBICORT) 160-4.5 MCG/ACT inhaler Inhale 2 puffs into the lungs 2 (two) times daily.    Yes [provider]  Cholecalciferol 25 MCG (1000 UT) tablet Take 1,000 Units by mouth daily.   Yes [provider]  diazepam (VALIUM) 5 MG tablet Take 5 mg by mouth every 6 (six) hours as needed for muscle spasms.    Yes [provider]  diphenhydrAMINE (BENADRYL) 25 MG tablet Take 25 mg by mouth every 6 (six) hours as needed for allergies.   Yes [provider]  docusate sodium (COLACE) 100 MG capsule Take 100 mg by mouth 2 (two) times daily.   Yes [provider]  FLUoxetine (PROZAC) 40 MG capsule Take 40 mg by mouth daily.   Yes [provider]  furosemide (LASIX) 40 MG tablet Take 40 mg by mouth 2  (two) times daily.   Yes [provider]  gabapentin (NEURONTIN) 100 MG capsule Take 100 mg by mouth 3 (three) times daily.    Yes [provider]  HYDROcodone-acetaminophen (NORCO/VICODIN) 5-325 MG tablet Take 1 tablet by mouth 3 (three) times daily as needed for moderate pain.   Yes [provider]  isosorbide mononitrate (IMDUR) 60 MG 24 hr tablet Take 60 mg by mouth daily.  03/12/20  Yes [provider]  levothyroxine (SYNTHROID) 175 MCG tablet Take 175 mcg by mouth daily before breakfast.    Yes [provider]  losartan (COZAAR) 50 MG tablet Take 50 mg by mouth daily. 10/19/18  Yes [provider]  metFORMIN (GLUCOPHAGE) 500 MG tablet Take 500 mg by mouth daily. 05/24/20  Yes [provider]  omeprazole (PRILOSEC) 20 MG capsule Take 20 mg by mouth daily.   Yes [provider]  oxyCODONE (OXY IR/ROXICODONE) 5 MG immediate release tablet Take 5 mg by mouth 4 (four) times daily as needed for pain. 10/02/20  Yes [provider]  potassium chloride SA (K-DUR,KLOR-CON) 20 MEQ tablet Take 20 mEq by mouth 3 (three) times daily.   Yes [provider]  simvastatin (ZOCOR) 80 MG tablet Take 80 mg by mouth daily.    Yes [provider]  tiotropium (SPIRIVA) 18 MCG inhalation capsule Place 18 mcg into inhaler and inhale daily.  12/08/18  Yes [provider]  tiZANidine (ZANAFLEX) 4 MG tablet Take 4 mg by mouth in the morning and at bedtime.  03/12/20  Yes [provider]  traZODone (DESYREL) 100 MG tablet Take 100 mg by mouth at bedtime.  02/04/20  Yes [provider]  albuterol (VENTOLIN HFA) 108 (90 Base) MCG/ACT inhaler Inhale 2 puffs into the lungs every 6 (six) hours as needed for wheezing or shortness of breath.    [provider]  methocarbamol (ROBAXIN) 500 MG tablet  03/20/20   [provider]  verapamil (CALAN-SR) 180 MG CR tablet Take 180 mg by mouth daily as  needed (blood pressure of 150/90 or higher).     [provider]    Scheduled Meds: . arformoterol  15 mcg Nebulization BID  . atorvastatin  40 mg Per Tube Daily  . chlorhexidine  15 mL Mouth Rinse BID  . Chlorhexidine Gluconate Cloth  6 each Topical Daily  . cholecalciferol  1,000 Units Per Tube Daily  . collagenase  Topical Daily  . docusate  100 mg Per Tube BID  . feeding supplement (PROSource TF)  90 mL Per Tube BID  . FLUoxetine  40 mg Per Tube Daily  . gabapentin  300 mg Per Tube Q8H  . guaiFENesin  15 mL Per Tube Q4H  . heparin injection (subcutaneous)  5,000 Units Subcutaneous Q8H  . insulin aspart  0-20 Units Subcutaneous Q4H  . insulin aspart  6 Units Subcutaneous TID WC  . insulin detemir  30 Units Subcutaneous BID  . isosorbide dinitrate  10 mg Per Tube TID  . levothyroxine  175 mcg Per Tube QAC breakfast  . mouth rinse  15 mL Mouth Rinse q12n4p  . multivitamin with minerals  1 tablet Per Tube Daily  . polyethylene glycol  17 g Per Tube Daily  . QUEtiapine  50 mg Per Tube BID  . revefenacin  175 mcg Nebulization Daily  . sodium chloride flush  10-40 mL Intracatheter Q12H  . traZODone  100 mg Per Tube QHS   Infusions: . sodium chloride 10 mL/hr at 11/17/20 0600  . feeding supplement (PIVOT 1.5 CAL) 1,000 mL (11/19/20 1326)   PRN Meds: acetaminophen (TYLENOL) oral liquid 160 mg/5 mL, albuterol, bisacodyl, Gerhardt's butt cream, labetalol, oxyCODONE, Resource ThickenUp Clear, sodium chloride flush   Allergies as of 10/26/2020 - Review Complete 10/26/2020  Allergen Reaction Noted  . Ibuprofen  12/25/2009    Family History  Problem Relation Age of Onset  . Hypertension Mother   . Anxiety disorder Mother   . Stroke Mother        "mini strokes"  . Coronary artery disease Mother   . Heart attack Mother   . Emphysema Father   . Heart disease Father   . Scoliosis Father   . Coronary artery disease Father   . GU problems Neg Hx   . Kidney disease Neg  Hx   . Prostate cancer Neg Hx     Social History   Socioeconomic History  . Marital status: Unknown    Spouse name: Not on file  . Number of children: 0  . Years of education: 60  . Highest education level: Associate degree: occupational, Hotel manager, or vocational program  Occupational History  . Not on file  Tobacco Use  . Smoking status: Current Every Day Smoker    Packs/day: 0.50    Years: 35.00    Pack years: 17.50  . Smokeless tobacco: Never Used  Vaping Use  . Vaping Use: Never used  Substance and Sexual Activity  . Alcohol use: Not Currently    Alcohol/week: 2.0 standard drinks    Types: 1 Glasses of wine, 1 Shots of liquor per week  . Drug use: No  . Sexual activity: Not on file  Other Topics Concern  . Not on file  Social History Narrative  . Not on file   Social Determinants of Health   Financial Resource Strain: Not on file  Food Insecurity: Not on file  Transportation Needs: Not on file  Physical Activity: Not on file  Stress: Not on file  Social Connections: Not on file  Intimate Partner Violence: Not on file    REVIEW OF SYSTEMS: Constitutional: Some weakness. ENT:  No nose bleeds Pulm: Chronic cough productive of clear mucus CV:  No palpitations, no LE edema.  No angina GU:  No hematuria, no frequency GI: See HPI Heme: No unusual bleeding or bruising Transfusions: none per Epic record Neuro:  No headaches, no peripheral tingling or  numbness Derm:  No itching, no rash or sores.  Endocrine:  No sweats or chills.  No polyuria or dysuria Immunization:  covid vax x 3.   Travel:  None beyond local counties in last few months.    PHYSICAL EXAM: Vital signs in last 24 hours: Vitals:   11/20/20 0819 11/20/20 1221  BP:  129/77  Pulse:  71  Resp:  18  Temp:  97.9 F (36.6 C)  SpO2: 100% 98%   Wt Readings from Last 3 Encounters:  11/19/20 111 kg  10/16/20 114.2 kg  04/17/20 120.2 kg    General: looks unwell.   Head: No facial asymmetry  or swelling.  No signs of head trauma. Eyes: No scleral icterus.  No conjunctival pallor.  EOMI Ears: No hearing deficit Nose: No discharge or congestion Mouth: Oropharynx moist, pink, clear.  Tongue midline. Neck: Bruise in anterior lower neck consistent with surgery Lungs: Frequent mucoid cough.  Wet vocal quality. Heart: RRR.  No MRG.  S1, S2 present Abdomen: Soft.  Not tender or distended.  No HSM.  No bruits, no masses.  Scar consistent with hernia repair at umbilicus. Rectal: Deferred Extremities: No CCE. Neurologic: Oriented x3.  Detailed historian.  Moves all 4 limbs, strength not tested.  No tremors Skin: No significant purpura, bruising no suspicious lesions or rash Psych: Cooperative, somewhat anxious, fluid speech.  Intake/Output from previous day: 01/03 0701 - 01/04 0700 In: 1640 [I.V.:1100; NG/GT:540] Out: -  Intake/Output this shift: No intake/output data recorded.  LAB RESULTS: Recent Labs    11/17/20 1920  WBC 17.8*  HGB 10.4*  HCT 33.4*  PLT 467*   BMET Lab Results  Component Value Date   NA 151 (H) 11/19/2020   NA 145 11/18/2020   NA 145 11/17/2020   K 4.3 11/19/2020   K 4.2 11/18/2020   K 4.4 11/17/2020   CL 111 11/19/2020   CL 104 11/18/2020   CL 103 11/17/2020   CO2 30 11/19/2020   CO2 31 11/18/2020   CO2 32 11/17/2020   GLUCOSE 117 (H) 11/19/2020   GLUCOSE 192 (H) 11/18/2020   GLUCOSE 149 (H) 11/17/2020   BUN 70 (H) 11/19/2020   BUN 89 (H) 11/18/2020   BUN 77 (H) 11/17/2020   CREATININE 0.79 11/19/2020   CREATININE 1.35 (H) 11/18/2020   CREATININE 1.28 (H) 11/17/2020   CALCIUM 9.9 11/19/2020   CALCIUM 9.6 11/18/2020   CALCIUM 9.5 11/17/2020   LFT Recent Labs    11/17/20 1920  PROT 6.0*  ALBUMIN 2.3*  AST 35  ALT 79*  ALKPHOS 160*  BILITOT 0.9   PT/INR No results found for: INR, PROTIME Hepatitis Panel No results for input(s): HEPBSAG, HCVAB, HEPAIGM, HEPBIGM in the last 72 hours. C-Diff No components found for:  CDIFF Lipase  No results found for: LIPASE  Drugs of Abuse  No results found for: LABOPIA, COCAINSCRNUR, LABBENZ, AMPHETMU, THCU, LABBARB   RADIOLOGY STUDIES: No results found.    IMPRESSION:   *   Chronic dysphagia, dating back at least to earlier in 2021, little benefit after esoph dilatation 04/2020.   04/17/2020 EGD w dilation of benign GEJx stenosis and esophageal candidiasis.   Candidiasis was never treated.  Takes Omeprazole 20 mg daily PTA, no PPI etc currently.   ? Underlying esoph dysmotility and or c-spine related injury/swelling?  *   Spinal stenosis.  Cspine surgeries 12/10 and 12/11 as per HPI  *   Mucous plugging, aspiration pneumonia, respiratory failure requiring BiPAP  and ETT vent 12/23 -12/29.  ETT may have impacted swallowing adversely.    *    Triumph anemia.  *    Elevated alk phos 214 >> 160, not unexpected after spinal surgery.   Elevated transaminases 292/248 on 12/23 >> 35/79 on 1/1.  No priors for comp.  No abdominal CTs, ultrasounds or MRIs in Epic.   Note he has had periodic low blood pressures into the 80s - 90s/50s-60s   PLAN:     *   Ba esophagram to assess for esoph spasm, esoph dysmotility.     Azucena Freed  11/20/2020, 3:00 PM Phone [phone]

## 2020-11-21 ENCOUNTER — Inpatient Hospital Stay (HOSPITAL_COMMUNITY): Payer: Medicare Other

## 2020-11-21 DIAGNOSIS — J9601 Acute respiratory failure with hypoxia: Secondary | ICD-10-CM | POA: Diagnosis not present

## 2020-11-21 DIAGNOSIS — R1314 Dysphagia, pharyngoesophageal phase: Secondary | ICD-10-CM | POA: Diagnosis not present

## 2020-11-21 DIAGNOSIS — R0902 Hypoxemia: Secondary | ICD-10-CM | POA: Diagnosis not present

## 2020-11-21 DIAGNOSIS — J189 Pneumonia, unspecified organism: Secondary | ICD-10-CM | POA: Diagnosis not present

## 2020-11-21 LAB — GLUCOSE, CAPILLARY
Glucose-Capillary: 121 mg/dL — ABNORMAL HIGH (ref 70–99)
Glucose-Capillary: 121 mg/dL — ABNORMAL HIGH (ref 70–99)
Glucose-Capillary: 123 mg/dL — ABNORMAL HIGH (ref 70–99)
Glucose-Capillary: 133 mg/dL — ABNORMAL HIGH (ref 70–99)
Glucose-Capillary: 155 mg/dL — ABNORMAL HIGH (ref 70–99)
Glucose-Capillary: 164 mg/dL — ABNORMAL HIGH (ref 70–99)
Glucose-Capillary: 73 mg/dL (ref 70–99)
Glucose-Capillary: 99 mg/dL (ref 70–99)

## 2020-11-21 MED ORDER — FLUCONAZOLE 40 MG/ML PO SUSR
100.0000 mg | Freq: Every day | ORAL | Status: DC
  Administered 2020-11-22 – 2020-11-29 (×8): 100 mg via ORAL
  Filled 2020-11-21 (×8): qty 2.5

## 2020-11-21 MED ORDER — FLUCONAZOLE 40 MG/ML PO SUSR
200.0000 mg | Freq: Once | ORAL | Status: AC
  Administered 2020-11-21: 200 mg via ORAL
  Filled 2020-11-21: qty 5

## 2020-11-21 NOTE — Progress Notes (Signed)
Patient ID: [name] [name], male   DOB: [birth year], 75 y.o.   MRN: [id number] BP 135/75 (BP Location: Left Arm)   Pulse 62   Temp 97.9 F (36.6 C) (Oral)   Resp 17   Ht 6\' 2"  (1.88 m)   Wt 111 kg   SpO2 98%   BMI 31.42 kg/m  Events of the day noted. Nutrition is biggest hurdle at this time [name] states he is aware and understanding of risks.  He is proceeding with the prescribed diet. Moving all extremities Wounds are continuing to heal well Placement for SNF pending

## 2020-11-21 NOTE — Progress Notes (Signed)
Physical Therapy Treatment Patient Details Name: [name] [name] MRN: [id number] DOB: 05-03-1946 Today's Date: 11/21/2020    History of Present Illness Patient is a 75 y/o male who presents s/p C3-4, 4-5 ACDF 12/10 with post op LUE weakness requiring C3-6 posterior decompressive lami [id number] complicated by post op hypoxemia. Reintubated on 12/23-12/29. PMH includes COPD, prostata CA, HTN, depression, CHF.    PT Comments    Pt demonstrating mobility progression today, standing with assist of max +2 assist multiple times and transferring to recliner without use of lift. Pt requires mod encouragement to transfer today given fear of falling and fatigue with mobility. PT updating recommendation to reflect SNF level of care post-acutely, per pt and family wishes.    Follow Up Recommendations  Supervision/Assistance - 24 hour;SNF     Equipment Recommendations  None recommended by PT    Recommendations for Other Services       Precautions / Restrictions Precautions Precautions: Fall;Cervical Precaution Comments: extubated 12/29, on 2LO2 (removed during session, SPO2 88% and greater, replaced 2LO2 post-session) Restrictions Weight Bearing Restrictions: No    Mobility  Bed Mobility Overal bed mobility: Needs Assistance Bed Mobility: Supine to Sit     Supine to sit: Max assist;+2 for physical assistance     General bed mobility comments: max +2 for trunk elevation and LE management, helicopter method utilized with bed pad to get pt to EOB safely.  Transfers Overall transfer level: Needs assistance Equipment used: 2 person hand held assist Transfers: Sit to/from [facility] Sit to Stand: Max assist;+2 physical assistance Stand pivot transfers: Max assist;+2 physical assistance       General transfer comment: max +2 for power up, rise, and steady. STS x3, from EOB each time. On last stand attempt, PT and PT aide assist pt to recliner on L side with assist for steadying,  guiding hips to destination surface, and slow eccentric lower into chair. Pt with fear of falling thoughout.  Ambulation/Gait             General Gait Details: nt - small steps to recliner only   Chief Strategy Officer    Modified Rankin (Stroke Patients Only)       Balance Overall balance assessment: Needs assistance Sitting-balance support: Single extremity supported;Feet supported Sitting balance-Leahy Scale: Fair Sitting balance - Comments: EOB without PT support Postural control: Posterior lean Standing balance support: During functional activity Standing balance-Leahy Scale: Zero Standing balance comment: max-total +2                            Cognition Arousal/Alertness: Awake/alert Behavior During Therapy: WFL for tasks assessed/performed Overall Cognitive Status: Impaired/Different from baseline Area of Impairment: Awareness;Problem solving;Safety/judgement;Memory;Following commands                   Current Attention Level: Selective Memory: Decreased recall of precautions Following Commands: Follows one step commands with increased time Safety/Judgement: Decreased awareness of safety;Decreased awareness of deficits Awareness: Emergent Problem Solving: Slow processing;Decreased initiation;Requires verbal cues;Requires tactile cues;Difficulty sequencing General Comments: A&Ox4, cues throughout mobility for safety      Exercises General Exercises - Lower Extremity Hip Flexion/Marching: AROM;Both;10 reps;Seated    General Comments        Pertinent Vitals/Pain Pain Assessment: Faces Faces Pain Scale: Hurts little more Pain Location: neck Pain Descriptors / Indicators: Grimacing;Discomfort;Sore Pain Intervention(s): Limited activity within patient's tolerance;Monitored  during session;Repositioned    Home Living                      Prior Function            PT Goals (current goals can now be  found in the care plan section) Acute Rehab PT Goals Patient Stated Goal: home PT Goal Formulation: With patient Time For Goal Achievement: 11/26/20 Potential to Achieve Goals: Fair Progress towards PT goals: Progressing toward goals    Frequency    Min 3X/week      PT Plan Discharge plan needs to be updated    Co-evaluation              AM-PAC PT "6 Clicks" Mobility   Outcome Measure  Help needed turning from your back to your side while in a flat bed without using bedrails?: A Lot Help needed moving from lying on your back to sitting on the side of a flat bed without using bedrails?: A Lot Help needed moving to and from a bed to a chair (including a wheelchair)?: A Lot Help needed standing up from a chair using your arms (e.g., wheelchair or bedside chair)?: A Lot Help needed to walk in hospital room?: Total Help needed climbing 3-5 steps with a railing? : Total 6 Click Score: 10    End of Session Equipment Utilized During Treatment: Gait belt Activity Tolerance: Patient limited by fatigue Patient left: with call bell/phone within reach;in chair;with chair alarm set Nurse Communication: Mobility status;Need for lift equipment (lift pad placed in recliner, for lift back to bed) PT Visit Diagnosis: Other abnormalities of gait and mobility (R26.89);Muscle weakness (generalized) (M62.81)     Time: 1610-9604 PT Time Calculation (min) (ACUTE ONLY): 21 min  Charges:  $Therapeutic Activity: 8-22 mins                    [name] [name], PT Acute Rehabilitation Services Pager [phone]  Office [phone]   [name] [name] 11/21/2020, 5:12 PM

## 2020-11-21 NOTE — Progress Notes (Addendum)
Daily Rounding Note  11/21/2020, 12:44 PM  LOS: 26 days   SUBJECTIVE:   Chief complaint: Dysphagia     Loose, wet cough persists.  Core track feeding tube came out overnight.   OBJECTIVE:         Vital signs in last 24 hours:    Temp:  [97.7 F (36.5 C)-98.2 F (36.8 C)] 98.2 F (36.8 C) (01/05 1153) Pulse Rate:  [62-75] 62 (01/05 1153) Resp:  [14-18] 15 (01/05 1153) BP: (109-162)/(62-136) 140/74 (01/05 1153) SpO2:  [98 %-100 %] 98 % (01/05 1153) FiO2 (%):  [100 %] 100 % (01/05 0846) Last BM Date: 11/20/20 Filed Weights   11/17/20 0332 11/18/20 0500 11/19/20 0500  Weight: 106.9 kg 111.3 kg 111 kg   General: Looks unwell but comfortable and alert Heart: RRR Chest: Wet vocal quality and cough Abdomen: Soft without tenderness.  Active bowel sounds.  No distention Extremities: No CCE Neuro/Psych: Alert.  Oriented x3.  Fluid speech.  Intake/Output from previous day: No intake/output data recorded.  Intake/Output this shift: No intake/output data recorded.  Lab Results: No results for input(s): WBC, HGB, HCT, PLT in the last 72 hours. BMET Recent Labs    11/19/20 0133  NA 151*  K 4.3  CL 111  CO2 30  GLUCOSE 117*  BUN 70*  CREATININE 0.79  CALCIUM 9.9   LFT No results for input(s): PROT, ALBUMIN, AST, ALT, ALKPHOS, BILITOT, BILIDIR, IBILI in the last 72 hours. PT/INR No results for input(s): LABPROT, INR in the last 72 hours. Hepatitis Panel No results for input(s): HEPBSAG, HCVAB, HEPAIGM, HEPBIGM in the last 72 hours.  Studies/Results: DG Abd 1 View  Result Date: 11/20/2020 CLINICAL DATA:  NG tube placement EXAM: ABDOMEN - 1 VIEW COMPARISON:  11/20/2020 FINDINGS: Esophageal tube tip and side-port project over the proximal stomach. Small pleural effusions with airspace disease at the left base. IMPRESSION: Esophageal tube tip and side-port project over the proximal stomach. Electronically Signed    By: Donavan Foil M.D.   On: 11/20/2020 19:52   DG Abd 1 View  Result Date: 11/20/2020 CLINICAL DATA:  NG tube placement EXAM: ABDOMEN - 1 VIEW COMPARISON:  10/28/2020 FINDINGS: Esophageal tube tip overlies the GE junction region, side-port projects over the distal esophagus. Small left-sided pleural effusion. IMPRESSION: Esophageal tube tip overlies the GE junction, side-port projects over the distal esophagus, consider further advancement for by at least 10 cm more optimal positioning. These results will be called to the ordering clinician or representative by the Radiologist Assistant, and communication documented in the PACS or [employer]. Electronically Signed   By: Donavan Foil M.D.   On: 11/20/2020 17:27   DG ESOPHAGUS W SINGLE CM (SOL OR THIN BA)  Result Date: 11/21/2020 CLINICAL DATA:  Dysphagia and coughing. Evaluate for gastroesophageal reflux. EXAM: ESOPHOGRAM/BARIUM SWALLOW TECHNIQUE: Single contrast examination was performed using thin barium. FLUOROSCOPY TIME:  Radiation Exposure Index (if provided by the fluoroscopic device): 20.2 mGy Number of Acquired Spot Images: 0 COMPARISON:  None. FINDINGS: Exam was limited by patient is physical condition, and was performed with patient in LPO semi erect position. There is no evidence of esophageal mass or stricture. Moderate esophageal dysmotility is demonstrated, with break up of primary peristalsis and intermittent non propulsive tertiary contractions. No evidence of hiatal hernia. No gastroesophgeal reflux during coughing and Valsalva maneuver. IMPRESSION: No evidence of esophageal mass or stricture. No evidence of hiatal hernia or gastroesophageal reflux. Moderate  esophageal dysmotility. Electronically Signed   By: Danae Orleans M.D.   On: 11/21/2020 11:09    ASSESMENT:   *   Chronic dysphagia.   Candida esophagitis, dilation of benign stenosis on EGD 04/2020 was never treated.  Brief, limited swallowing improvement after esoph  dilatation.   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   coretrak FT in place, tolerating tube feeds.   Latest SLP recommendations of 11/20/2020 was for nectar thick liquids via teaspoon.  Dr. Sueanne Margarita notes "diet has been approved".  However po order is still NPO.    *   S/p c spine surgery.     PLAN   *   PO Diflucan suspension x 14 d.  This to address the never treated Candida esophagitis of 04/2020.  May not help his dysphagia but the risk of adverse reaction is low in a course of Diflucan. Changed NPO orders to NPO except nectar thickened suspension meds.     *   Unlikely patient is going to be able to meet nutritional requirements when his PO is limited to teaspoons of nectar thick liquids.  Will defer replacement of core track feeding tube to attending physicians.   *   No plans for EGD.  GI signing off.  Can fup w GI primary Dr Midge Minium in [place] [name] [name]  11/21/2020, 12:44 PM Phone [phone]

## 2020-11-21 NOTE — Progress Notes (Signed)
PROGRESS NOTE  [name] [name] [id number] DOB: October 20, 1946 DOA: 10/26/2020 PCP: Idelle Crouch, MD  Brief History    [name] [name] is a 75 y.o. year old male with medical history significant for OSA on NIV at home, cervical stenosis admitted on 12/10 for ACDF.  Hospital course was complicated by postoperative left extremity weakness requiring posterior cervical laminectomy on 12/11, postoperative hypoxemia complicated by aspiration pneumonia (treated with Unasyn 12/12-12/17,, mucous plugging requiring BiPAP, systemic steroids worsening bronchospasms and frequent suctioning while awaiting improvement in cough strength with assistance from PCCM was transferred to progressive unit under care of neurosurgical services with [facility] hospitalist asked to consult for medical comorbidities on 11/18/2020.  Pt continues to have issues with dysphagia and is dependent upon tube feeds. Although it is likely that the patient's recent cervical procedures will preclude EGD at this point, I have consulted GI to evaluate the patient. At their request DG esophagram was ordered. It demonstrated no stricture, but somewhat disordered motility. I appreciate GI's assistance.  Procedures  . ACDF 12/10 . Posterior cervical laminectomy 12/11  Antibiotics   Anti-infectives (From admission, onward)   Start     Dose/Rate Route Frequency Ordered Stop   11/22/20 1000  fluconazole (DIFLUCAN) 40 MG/ML suspension 100 mg        100 mg Oral Daily 11/21/20 1305     11/21/20 1400  fluconazole (DIFLUCAN) 40 MG/ML suspension 200 mg        200 mg Oral  Once 11/21/20 1305     11/13/20 1330  fluconazole (DIFLUCAN) IVPB 400 mg  Status:  Discontinued        400 mg 100 mL/hr over 120 Minutes Intravenous Every 24 hours 11/13/20 1230 11/14/20 1626   11/07/20 1700  ceFEPIme (MAXIPIME) 2 g in sodium chloride 0.9 % 100 mL IVPB        2 g 200 mL/hr over 30 Minutes Intravenous Every 8 hours 11/07/20 0935 11/13/20 1843   11/04/20 1000   cefTRIAXone (ROCEPHIN) 2 g in sodium chloride 0.9 % 100 mL IVPB  Status:  Discontinued        2 g 200 mL/hr over 30 Minutes Intravenous Every 24 hours 11/04/20 0903 11/07/20 0933   10/28/20 2100  Ampicillin-Sulbactam (UNASYN) 3 g in sodium chloride 0.9 % 100 mL IVPB  Status:  Discontinued        3 g 200 mL/hr over 30 Minutes Intravenous Every 6 hours 10/28/20 1917 11/02/20 1019   10/27/20 1707  ceFAZolin (ANCEF) 2-4 GM/100ML-% IVPB       Note to Pharmacy: Henrine Screws   : cabinet override      10/27/20 1707 10/28/20 0514   10/27/20 1700  ceFAZolin (ANCEF) IVPB 2g/100 mL premix  Status:  Discontinued        2 g 200 mL/hr over 30 Minutes Intravenous  Once 10/27/20 1658 10/27/20 2224      Subjective  The patient is resting comfortably. He is complaining of back pain.  Objective   Vitals:  Vitals:   11/21/20 0757 11/21/20 1153  BP: 129/75 140/74  Pulse: 65 62  Resp: 18 15  Temp: 97.7 F (36.5 C) 98.2 F (36.8 C)  SpO2: 100% 98%  Exam:  Constitutional:  . The patient is awake and alert.  Mild distress from back pain. Respiratory:  . No increased work of breathing. . No wheezes, rales, or rhonchi . No tactile fremitus . Diminished breath sounds bilaterally. Cardiovascular:  . Regular rate and rhythm . No  murmurs, ectopy, or gallups. . No lateral PMI. No thrills. Abdomen:  . Abdomen is soft, non-tender, non-distended . No hernias, masses, or organomegaly . Normoactive bowel sounds.  Musculoskeletal:  . No cyanosis, clubbing, or edema Skin:  . No rashes, lesions, ulcers . palpation of skin: no induration or nodules Neurologic:  . CN 2-12 intact . Sensation all 4 extremities intact Psychiatric:  . Mental status o Mood, affect appropriate o Orientation to person, place, time  . judgment and insight appear intact  I have personally reviewed the following:   Today's Data  . Dispensing optician  . Respiratory culture: No growth  Imaging  . CXR  11/15/2020 . Renal ultrasound  Scheduled Meds: . arformoterol  15 mcg Nebulization BID  . atorvastatin  40 mg Per Tube Daily  . chlorhexidine  15 mL Mouth Rinse BID  . Chlorhexidine Gluconate Cloth  6 each Topical Daily  . cholecalciferol  1,000 Units Per Tube Daily  . collagenase   Topical Daily  . docusate  100 mg Per Tube BID  . feeding supplement (PROSource TF)  90 mL Per Tube BID  . [START ON 11/22/2020] fluconazole  100 mg Oral Daily  . fluconazole  200 mg Oral Once  . FLUoxetine  40 mg Per Tube Daily  . gabapentin  300 mg Per Tube Q8H  . guaiFENesin  15 mL Per Tube Q4H  . heparin injection (subcutaneous)  5,000 Units Subcutaneous Q8H  . insulin aspart  0-20 Units Subcutaneous Q4H  . insulin aspart  6 Units Subcutaneous TID WC  . insulin detemir  30 Units Subcutaneous BID  . isosorbide dinitrate  10 mg Per Tube TID  . levothyroxine  175 mcg Per Tube QAC breakfast  . mouth rinse  15 mL Mouth Rinse q12n4p  . multivitamin with minerals  1 tablet Per Tube Daily  . polyethylene glycol  17 g Per Tube Daily  . QUEtiapine  50 mg Per Tube BID  . revefenacin  175 mcg Nebulization Daily  . sodium chloride flush  10-40 mL Intracatheter Q12H  . traZODone  100 mg Per Tube QHS   Continuous Infusions: . sodium chloride 10 mL/hr at 11/17/20 0600  . feeding supplement (PIVOT 1.5 CAL) 1,000 mL (11/20/20 2131)    Active Problems:   Acute respiratory failure (HCC)   Acute exacerbation of chronic obstructive pulmonary disease (COPD) (HCC)   Major depressive disorder   Hypothyroidism   OSA (obstructive sleep apnea)   HTN (hypertension)   HLD (hyperlipidemia)   CHF (congestive heart failure) (HCC)   Left cervical radiculopathy   Acute cervical radiculopathy   Pressure injury of skin   HCAP (healthcare-associated pneumonia)   Mucus plugging of bronchi   Esophageal dysphagia   Elevated BUN   AKI (acute kidney injury) (HCC)   LOS: 26 days    A & P   Acute on chronic  hypoxemic/hypercarbic respiratory failure, multifactorial etiology, improving.Marland Kitchen  COPD exacerbation resolved.  Completed antibiotic course for Proteus vulgaris pneumonia.  Still high aspiration risk given dysphagia and hospital-acquired delirium further complicated by mucous plugging and atelectasis. Currently the patient is saturating 100% on 5 L . He will continue to receive supportive care with flutter valve, incentive spirometry, scheduled inhalers. He is also to receive chest physiotherapy, suctioning, continue O2 goal SPO2 greater than 88.  Nonoliguric AKI. Resolved with IV fljuids.  Suspect prerenal in the setting of some recent low BP (nadir of 87/52 on 1/1) with hardly any p.o. intake  though patient is receiving tube feeds complicated by IV lasix use for his CHF as well as home losartan  Baseline creatinine 0.8-1.1, currently 0.79 with a BUN of 70.  Normal bladder with no hydronephrosis on renal ultrasound suggesting medical renal disease based off echogenicity.  UA unremarkableContinue to monitor creatinine, electrolytes, and volume status. Avoid nephrotoxins and hypotension.  Dysphagia: Concern for achalasia vs esophageal stricture. 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  Status post modified barium swallow evaluation by speech. The patient is full assist for feeding, nectar thick liquids twice a day. The patient has a known history of stricture s/p unsuccessful dilatation. Pt continues to have issues with dysphagia and is dependent upon tube feeds post operatively. Although it is likely that the patient's recent cervical procedures will preclude EGD at this point, I have consulted GI to evaluate the patient. At their request DG esophagram was performed. I did not demonstrate stricture, but did demonstrate dysmotiity. GI assistance is appreciated. SLP re-evaluated the patient. Although the patient remains at considerable risk due to dysphagia,  the patient expressed a desire to eat. This is complicated as the patient is still a full code and his latest aspiration event led to intubation. I have consulted palliative care to discuss these issues with him before a change to his NPO status is made.  Encephalopathy: Improving daily. Alert and oriented to self and place this morning, but a little confused. Continue delirium precautions including frequent orientation, minimizing sedating medications. Continue scheduled Seroquel and trazodone, Prozac  Hypothyroidism: Continue Synthroid  Chronic diastolic CHF: Noted. Compensated. The patient appears euvolemic. Monitor volume status. Hold diuretics for now.   HTN: Currently normotensive.  Did have nadir of 80s over 50s on 1/1. Home losartan held given AKI. Verapamil not ordered on admission. Monitor.  HLD: Continue statin as at home.  Type 2 diabetes:  A1c 6.9.  CBGs currently at goal. The patient is receivingLevemir 30 units sq daily with SSI per FSBS on Q4 hr schedule as he is currently receiving tube feeds via cortrack.  OSA: Continue Nightly BiPAP  Leukocytosis:  stable.  Currently 17.8, remains afebrile. Monitor CBC.  Chronic normocytic anemia, stable.  Hemoglobin stable at baseline. Monitor CBC.   Status post ACDF status post laminectomy: Postoperative management per primary (NSG).  I have seen and examined this patient myself. I have spent 39 minutes in his evaluation and care.   Rebecca Motta, DO [facility] Direct contact: see [email or web address]  7PM-7AM contact night coverage as above 11/21/2020, 4:18 PM  LOS: 24 days

## 2020-11-21 NOTE — Progress Notes (Addendum)
  Speech Language Pathology Treatment: Dysphagia  Patient Details Name: [name] [name] MRN: [id number] DOB: 06/13/1946 Today's Date: 11/21/2020 Time: 1364-3837 SLP Time Calculation (min) (ACUTE ONLY): 22 min  Assessment / Plan / Recommendation Clinical Impression  [name] [name] is alert and cognition seems back to hospital baseline versus Monday when he had increased confusion. Discussed his current dysphagia with goals regarding nutrition. His esophagram today did revealed moderate dysmotility, no stricture and no mention of observing incidental aspiration. Educated re: risks/benefits of initiating diet/liquids; he stated he wanted to accept risks and eat stating "I've had a good life and if it's my time I want to enjoy it." Discussed with Dr. Gerri Lins who stated she would consult Palliative care since he is a full code versus initiating diet. Pt's oral cavity cleaned and consumed nectar thick [place] with delayed throat clear intermittently.     HPI HPI: Pt is a 75 yo male s/p anterior cervical discetomy at C3/4 and C5/6 following removal of existing plate, admitted afterwards for sudden onset weakness and pain in his LUE. CT unremarkable; MRI pending. On 12/11 he was made NPO for difficulty swallowing. On 12/11 he underwent C3-6 posterior decompressive laminectomies during which he was intubated. PMH includes: thyroid ca, prostate ca, HTN, HLD, dyspnea, depression, COPD, CHF, anxiety. MBS 11/01/20 recommending Dys 1(puree) and pudding thick liqui. Following day had respiratory decline requiring Bipap until 12/23 when he was intubated and extubated 12/29. New order placed for swallow assessment (considered treat since already on caseload).      SLP Plan  Continue with current plan of care       Recommendations  Diet recommendations:  (nectar thick liquids 4 oz twice a day) Liquids provided via: Teaspoon Medication Administration: Via alternative means                Oral Care Recommendations: Oral  care QID Follow up Recommendations: Skilled Nursing facility SLP Visit Diagnosis: Dysphagia, pharyngoesophageal phase (R13.14) Plan: Continue with current plan of care       GO                Royce Macadamia 11/21/2020, 1:37 PM

## 2020-11-22 ENCOUNTER — Inpatient Hospital Stay (HOSPITAL_COMMUNITY): Payer: Medicare Other

## 2020-11-22 DIAGNOSIS — J9601 Acute respiratory failure with hypoxia: Secondary | ICD-10-CM | POA: Diagnosis not present

## 2020-11-22 DIAGNOSIS — R799 Abnormal finding of blood chemistry, unspecified: Secondary | ICD-10-CM

## 2020-11-22 LAB — GLUCOSE, CAPILLARY
Glucose-Capillary: 96 mg/dL (ref 70–99)
Glucose-Capillary: 83 mg/dL (ref 70–99)
Glucose-Capillary: 102 mg/dL — ABNORMAL HIGH (ref 70–99)
Glucose-Capillary: 85 mg/dL (ref 70–99)
Glucose-Capillary: 126 mg/dL — ABNORMAL HIGH (ref 70–99)

## 2020-11-22 MED ORDER — OXYCODONE HCL 5 MG/5ML PO SOLN
5.0000 mg | ORAL | Status: DC | PRN
  Administered 2020-11-22 – 2020-11-23 (×2): 5 mg via ORAL
  Filled 2020-11-22 (×2): qty 5

## 2020-11-22 MED ORDER — DIAZEPAM 5 MG PO TABS
5.0000 mg | ORAL_TABLET | Freq: Every day | ORAL | Status: DC
  Administered 2020-11-22 – 2020-11-28 (×7): 5 mg via ORAL
  Filled 2020-11-22 (×7): qty 1

## 2020-11-22 MED ORDER — PANTOPRAZOLE SODIUM 40 MG PO TBEC
40.0000 mg | DELAYED_RELEASE_TABLET | Freq: Every day | ORAL | Status: DC
  Administered 2020-11-22 – 2020-11-29 (×8): 40 mg via ORAL
  Filled 2020-11-22 (×8): qty 1

## 2020-11-22 MED ORDER — GLYCOPYRROLATE 1 MG PO TABS
0.5000 mg | ORAL_TABLET | ORAL | Status: DC | PRN
Start: 2020-11-22 — End: 2020-11-29

## 2020-11-22 MED ORDER — GUAIFENESIN ER 600 MG PO TB12
600.0000 mg | ORAL_TABLET | Freq: Two times a day (BID) | ORAL | Status: DC
  Administered 2020-11-22 – 2020-11-29 (×14): 600 mg via ORAL
  Filled 2020-11-22 (×14): qty 1

## 2020-11-22 MED ORDER — ADULT MULTIVITAMIN W/MINERALS CH
1.0000 | ORAL_TABLET | Freq: Every day | ORAL | Status: DC
  Administered 2020-11-23 – 2020-11-29 (×7): 1 via ORAL
  Filled 2020-11-22 (×7): qty 1

## 2020-11-22 MED ORDER — FLUTICASONE PROPIONATE 50 MCG/ACT NA SUSP
2.0000 | Freq: Every day | NASAL | Status: DC
  Administered 2020-11-23 – 2020-11-28 (×6): 2 via NASAL
  Filled 2020-11-22: qty 16

## 2020-11-22 MED ORDER — SENNOSIDES-DOCUSATE SODIUM 8.6-50 MG PO TABS
1.0000 | ORAL_TABLET | Freq: Every day | ORAL | Status: DC
  Administered 2020-11-22 – 2020-11-28 (×7): 1 via ORAL
  Filled 2020-11-22 (×7): qty 1

## 2020-11-22 MED ORDER — ACETAMINOPHEN 500 MG PO TABS
1000.0000 mg | ORAL_TABLET | Freq: Three times a day (TID) | ORAL | Status: DC
Start: 2020-11-22 — End: 2020-11-29
  Administered 2020-11-22 – 2020-11-29 (×21): 1000 mg via ORAL
  Filled 2020-11-22 (×21): qty 2

## 2020-11-22 MED ORDER — GUAIFENESIN 100 MG/5ML PO SOLN
15.0000 mL | Freq: Four times a day (QID) | ORAL | Status: DC | PRN
  Administered 2020-11-22: 300 mg via ORAL
  Filled 2020-11-22: qty 10

## 2020-11-22 MED ORDER — POLYETHYLENE GLYCOL 3350 17 G PO PACK: 17.0000 g | PACK | Freq: Every day | ORAL | Status: DC | PRN

## 2020-11-22 MED ORDER — LEVOTHYROXINE SODIUM 75 MCG PO TABS
175.0000 ug | ORAL_TABLET | Freq: Every day | ORAL | Status: DC
  Administered 2020-11-23 – 2020-11-29 (×7): 175 ug via ORAL
  Filled 2020-11-22 (×7): qty 1

## 2020-11-22 MED ORDER — TRAZODONE HCL 100 MG PO TABS
100.0000 mg | ORAL_TABLET | Freq: Every day | ORAL | Status: DC
  Administered 2020-11-22 – 2020-11-28 (×7): 100 mg via ORAL
  Filled 2020-11-22 (×7): qty 1

## 2020-11-22 MED ORDER — GABAPENTIN 300 MG PO CAPS
600.0000 mg | ORAL_CAPSULE | Freq: Two times a day (BID) | ORAL | Status: DC
  Administered 2020-11-22 – 2020-11-27 (×11): 600 mg
  Filled 2020-11-22 (×11): qty 2

## 2020-11-22 MED ORDER — ATORVASTATIN CALCIUM 40 MG PO TABS
40.0000 mg | ORAL_TABLET | Freq: Every day | ORAL | Status: DC
  Administered 2020-11-23 – 2020-11-29 (×7): 40 mg via ORAL
  Filled 2020-11-22 (×7): qty 1

## 2020-11-22 MED ORDER — FLUOXETINE HCL 20 MG PO CAPS
40.0000 mg | ORAL_CAPSULE | Freq: Every day | ORAL | Status: DC
  Administered 2020-11-23 – 2020-11-29 (×7): 40 mg via ORAL
  Filled 2020-11-22 (×7): qty 2

## 2020-11-22 MED ORDER — METHOCARBAMOL 750 MG PO TABS
750.0000 mg | ORAL_TABLET | Freq: Four times a day (QID) | ORAL | Status: DC | PRN
  Administered 2020-11-22 – 2020-11-29 (×12): 750 mg via ORAL
  Filled 2020-11-22 (×12): qty 1

## 2020-11-22 NOTE — Progress Notes (Signed)
PROGRESS NOTE  [name] [name] TTS:[id number] DOB: April 26, 1946 DOA: 10/26/2020 PCP: Marguarite Arbour, MD  Brief History    [name] [name] is a 75 y.o. year old male with medical history significant for OSA on NIV at home, cervical stenosis admitted on 12/10 for ACDF.  Hospital course was complicated by postoperative left extremity weakness requiring posterior cervical laminectomy on 12/11, postoperative hypoxemia complicated by aspiration pneumonia (treated with Unasyn 12/12-12/17,, mucous plugging requiring BiPAP, systemic steroids worsening bronchospasms and frequent suctioning while awaiting improvement in cough strength with assistance from PCCM was transferred to progressive unit under care of neurosurgical services with [facility] asked to consult for medical comorbidities on 11/18/2020.  Pt continues to have issues with dysphagia and is dependent upon tube feeds. Although it is likely that the patient's recent cervical procedures will preclude EGD at this point, I have consulted GI to evaluate the patient. At their request DG esophagram was ordered. It demonstrated no stricture, but somewhat disordered motility. I appreciate GI's assistance.  SLP re-evaluated the patient and found that he continues to be at considerable risk of aspiration. However, the patient told her that he wanted to be able to eat even if he may aspirate and die. I discussed this with the patient and explained that his status as a full code complicates this and that he would need to be a DNI or DNR if he wished to eat. I consulted palliative care to discuss this with them. Dr. Phillips Odor discussed this with the patient today. He has decided to be a partial code with the only intervention in the case of loss of spontaneous respirations or cardiac activity would be administration of medications for CPR. I will start a diet in accordance with SLP's recommendations.  Procedures  . ACDF 12/10 . Posterior cervical laminectomy  12/11  Antibiotics   Anti-infectives (From admission, onward)   Start     Dose/Rate Route Frequency Ordered Stop   11/22/20 1000  fluconazole (DIFLUCAN) 40 MG/ML suspension 100 mg        100 mg Oral Daily 11/21/20 1305     11/21/20 1400  fluconazole (DIFLUCAN) 40 MG/ML suspension 200 mg        200 mg Oral  Once 11/21/20 1305 11/21/20 1614   11/13/20 1330  fluconazole (DIFLUCAN) IVPB 400 mg  Status:  Discontinued        400 mg 100 mL/hr over 120 Minutes Intravenous Every 24 hours 11/13/20 1230 11/14/20 1626   11/07/20 1700  ceFEPIme (MAXIPIME) 2 g in sodium chloride 0.9 % 100 mL IVPB        2 g 200 mL/hr over 30 Minutes Intravenous Every 8 hours 11/07/20 0935 11/13/20 1843   11/04/20 1000  cefTRIAXone (ROCEPHIN) 2 g in sodium chloride 0.9 % 100 mL IVPB  Status:  Discontinued        2 g 200 mL/hr over 30 Minutes Intravenous Every 24 hours 11/04/20 0903 11/07/20 0933   10/28/20 2100  Ampicillin-Sulbactam (UNASYN) 3 g in sodium chloride 0.9 % 100 mL IVPB  Status:  Discontinued        3 g 200 mL/hr over 30 Minutes Intravenous Every 6 hours 10/28/20 1917 11/02/20 1019   10/27/20 1707  ceFAZolin (ANCEF) 2-4 GM/100ML-% IVPB       Note to Pharmacy: Shireen Quan   : cabinet override      10/27/20 1707 10/28/20 0514   10/27/20 1700  ceFAZolin (ANCEF) IVPB 2g/100 mL premix  Status:  Discontinued  2 g 200 mL/hr over 30 Minutes Intravenous  Once 10/27/20 1658 10/27/20 2224      Subjective  The patient is resting comfortably. No new complaints.  Objective   Vitals:  Vitals:   11/22/20 1145 11/22/20 1600  BP: 112/60 (!) 175/79  Pulse: 67 (!) 57  Resp: 17 16  Temp: (!) 97.5 F (36.4 C) 97.9 F (36.6 C)  SpO2: 95% 95%  Exam:  Constitutional:  . The patient is awake and alert. No acute distress. Respiratory:  . No increased work of breathing. . No wheezes, rales, or rhonchi . No tactile fremitus . Diminished breath sounds bilaterally. Cardiovascular:  . Regular rate and  rhythm . No murmurs, ectopy, or gallups. . No lateral PMI. No thrills. Abdomen:  . Abdomen is soft, non-tender, non-distended . No hernias, masses, or organomegaly . Normoactive bowel sounds.  Musculoskeletal:  . No cyanosis, clubbing, or edema Skin:  . No rashes, lesions, ulcers . palpation of skin: no induration or nodules Neurologic:  . CN 2-12 intact . Sensation all 4 extremities intact Psychiatric:  . Mental status o Mood, affect appropriate o Orientation to person, place, time  . judgment and insight appear intact  I have personally reviewed the following:   Today's Data  . Vitals, Glucoses  Micro Data  . Respiratory culture: No growth  Imaging  . CXR 11/15/2020 . Renal ultrasound  Scheduled Meds: . acetaminophen  1,000 mg Oral TID  . arformoterol  15 mcg Nebulization BID  . [START ON 11/23/2020] atorvastatin  40 mg Oral Daily  . chlorhexidine  15 mL Mouth Rinse BID  . Chlorhexidine Gluconate Cloth  6 each Topical Daily  . collagenase   Topical Daily  . diazepam  5 mg Oral QHS  . fluconazole  100 mg Oral Daily  . [START ON 11/23/2020] FLUoxetine  40 mg Oral Daily  . fluticasone  2 spray Each Nare Daily  . gabapentin  600 mg Per Tube BID  . guaiFENesin  600 mg Oral BID  . heparin injection (subcutaneous)  5,000 Units Subcutaneous Q8H  . insulin aspart  0-20 Units Subcutaneous Q4H  . insulin aspart  6 Units Subcutaneous TID WC  . insulin detemir  30 Units Subcutaneous BID  . isosorbide dinitrate  10 mg Per Tube TID  . [START ON 11/23/2020] levothyroxine  175 mcg Oral QAC breakfast  . mouth rinse  15 mL Mouth Rinse q12n4p  . [START ON 11/23/2020] multivitamin with minerals  1 tablet Oral Daily  . revefenacin  175 mcg Nebulization Daily  . senna-docusate  1 tablet Oral QHS  . sodium chloride flush  10-40 mL Intracatheter Q12H  . traZODone  100 mg Oral QHS   Continuous Infusions: . sodium chloride 10 mL/hr at 11/17/20 0600    Active Problems:   Acute  respiratory failure (HCC)   Acute exacerbation of chronic obstructive pulmonary disease (COPD) (HCC)   Major depressive disorder   Hypothyroidism   OSA (obstructive sleep apnea)   HTN (hypertension)   HLD (hyperlipidemia)   CHF (congestive heart failure) (HCC)   Left cervical radiculopathy   Acute cervical radiculopathy   Pressure injury of skin   HCAP (healthcare-associated pneumonia)   Mucus plugging of bronchi   Esophageal dysphagia   Elevated BUN   AKI (acute kidney injury) ([place])   LOS: 27 days    A & P   Acute on chronic hypoxemic/hypercarbic respiratory failure, multifactorial etiology, improving.Marland Kitchen  COPD exacerbation resolved.  Completed antibiotic course for  Proteus vulgaris pneumonia.  Still high aspiration risk given dysphagia and hospital-acquired delirium further complicated by mucous plugging and atelectasis. Currently the patient is saturating 100% on 5 L . He will continue to receive supportive care with flutter valve, incentive spirometry, scheduled inhalers. He is also to receive chest physiotherapy, suctioning, continue O2 goal SPO2 greater than 88.  Nonoliguric AKI. Resolved with IV fljuids.  Suspect prerenal in the setting of some recent low BP (nadir of 87/52 on 1/1) with hardly any p.o. intake though patient is receiving tube feeds complicated by IV lasix use for his CHF as well as home losartan  Baseline creatinine 0.8-1.1, currently 0.79 with a BUN of 70.  Normal bladder with no hydronephrosis on renal ultrasound suggesting medical renal disease based off echogenicity.  UA unremarkableContinue to monitor creatinine, electrolytes, and volume status. Avoid nephrotoxins and hypotension.  Dysphagia: Concern for achalasia vs esophageal stricture. 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  Status post modified barium swallow evaluation by speech. The patient is full assist for feeding, nectar thick liquids twice a  day. The patient has a known history of stricture s/p unsuccessful dilatation. Pt continues to have issues with dysphagia and is dependent upon tube feeds post operatively. Although it is likely that the patient's recent cervical procedures will preclude EGD at this point, I have consulted GI to evaluate the patient. At their request DG esophagram was performed. I did not demonstrate stricture, but did demonstrate dysmotiity. GI assistance is appreciated. SLP re-evaluated the patient and found that he continues to be at considerable risk of aspiration. However, the patient told her that he wanted to be able to eat even if he may aspirate and die. I discussed this with the patient and explained that his status as a full code complicates this and that he would need to be a DNI or DNR if he wished to eat. I consulted palliative care to discuss this with them. Dr. Hilma Favors discussed this with the patient today. He has decided to be a partial code with the only intervention in the case of loss of spontaneous respirations or cardiac activity would be administration of medications for CPR. I will start a diet in accordance with SLP's recommendations.  Encephalopathy: Improving daily. Alert and oriented to self and place this morning, but a little confused. Continue delirium precautions including frequent orientation, minimizing sedating medications. Continue scheduled Seroquel and trazodone, Prozac  Hypothyroidism: Continue Synthroid  Chronic diastolic CHF: Noted. Compensated. The patient appears euvolemic. Monitor volume status. Hold diuretics for now.   HTN: Currently normotensive.  Did have nadir of 80s over 50s on 1/1. Home losartan held given AKI. Verapamil not ordered on admission. Monitor.  HLD: Continue statin as at home.  Type 2 diabetes:  A1c 6.9.  CBGs currently at goal. The patient is receivingLevemir 30 units sq daily with SSI per FSBS on Q4 hr schedule as he is currently receiving tube feeds  via cortrack.  OSA: Continue Nightly BiPAP  Leukocytosis:  stable.  Currently 17.8, remains afebrile. Monitor CBC.  Chronic normocytic anemia, stable.  Hemoglobin stable at baseline. Monitor CBC.   Status post ACDF status post laminectomy: Postoperative management per primary (NSG).  I have seen and examined this patient myself. I have spent 39 minutes in his evaluation and care.   Anyssa Sharpless, DO [facility] Direct contact: see [email or web address]  7PM-7AM contact night coverage as above 11/22/2020, 5:05 PM  LOS: 24 days

## 2020-11-22 NOTE — TOC Progression Note (Signed)
Transition of Care [facility]) - Progression Note    Patient Details  Name: [name] [name] MRN: [id number] Date of Birth: Jan 27, 1946  Transition of Care [facility]) CM/SW Contact  Eduard Roux, [place] Phone Number: 11/22/2020, 4:25 PM  Clinical Narrative:     CSW visit with patient at bedside. CSW introduced self and explained role. CSW discussed shoirt term rehab at SNF with patient. He was agreeable to SNF placement. SNF preference was [facility] and [facility]. CSW informed patient of bed offers.   CSW called patient's sister to provide bed offers- no answer CSW left patient with medicare.gov ratings  CSW contacted [facility] SNF- # [phone], left voice message for Lesie/Admissions Director-requested to review referral and call CSW back CSW contacted [facility]- left voice message for Deborah/Admission -requested to review referral and call CSW back -informed she will be out until Monday.  Antony Blackbird, MSW, LCSW Clinical Social Worker   Expected Discharge Plan: Skilled Nursing Facility Barriers to Discharge: Continued Medical Work up  Expected Discharge Plan and Services Expected Discharge Plan: Skilled Nursing Facility   Discharge Planning Services: CM Consult   Living arrangements for the past 2 months: Single Family Home                                       Social Determinants of Health (SDOH) Interventions    Readmission Risk Interventions No flowsheet data found.

## 2020-11-22 NOTE — Care Management Important Message (Signed)
Important Message  Patient Details  Name: [name] [name] MRN: [id number] Date of Birth: August 15, 1946   Medicare Important Message Given:  Yes     Dorena Bodo 11/22/2020, 3:08 PM

## 2020-11-22 NOTE — Progress Notes (Signed)
Nutrition Follow-up  DOCUMENTATION CODES:   Not applicable  INTERVENTION:  Pending goals of care with Palliative: Diet advancement despite aspiration risk vs resuming enteral nutrition.   NUTRITION DIAGNOSIS:   Inadequate oral intake related to inability to eat as evidenced by NPO status; ongoing  GOAL:   Patient will meet greater than or equal to 90% of their needs; not met  MONITOR:   Diet advancement,TF tolerance  REASON FOR ASSESSMENT:   Consult Enteral/tube feeding initiation and management  ASSESSMENT:   Pt with PMH of cervial stenosis, CHF, OSA, and COPD admitted for ACDF on 12/10.  12/10 ACDF 12/11 s/p decompressive laminectomies  12/12 pt with hypoxia due to left pleural effusion and atelectasis  12/13 cortrak placed  12/17 requiring BiPAP support  12/23 intubated 12/29 extubated, TF d/c'ed 12/30 SLP eval, NPO, TF resumed 1/4 Cortrak NGT clogged, removed  Pt refusing replacement of NGT and tube feeds. Pt requesting for diet advancement despite aspiration risk. Per MD, last aspiration event resulted in pt needing to be intubated. Per esophagram yesterday, pt with no stricture however did reveal moderate dysmotility. SLP recommends continuation of NPO diet, however able to allow pt to consume 4 oz of nectar thick liquids BID, which will not provide adequate nutrition. Pt is full code. Pt accepting of risks with eating. MD has consulted palliative care for goals of care discussion/symptom management prior to any changes on diet to be made.   Labs and medications reviewed.   Diet Order:   Diet Order            Diet NPO time specified Except for: Other (See Comments)  Diet effective now                 EDUCATION NEEDS:   No education needs have been identified at this time  Skin:  Skin Assessment: Skin Integrity Issues: Skin Integrity Issues:: Stage II DTI: nose Stage II: perineum, back  Last BM:  1/5  Height:   Ht Readings from Last 1  Encounters:  11/01/20 $RemoveB'6\' 2"'HfYdBMED$  (1.88 m)    Weight:   Wt Readings from Last 1 Encounters:  11/22/20 108 kg    BMI:  Body mass index is 30.57 kg/m.  Estimated Nutritional Needs:   Kcal:  2300-2500  Protein:  145-170 grams  Fluid:  2 L/day  Corrin Parker, MS, RD, LDN RD pager number/after hours weekend pager number on Amion.

## 2020-11-22 NOTE — Progress Notes (Signed)
Discussed with pt there was an order for an NGT to be placed but pt refused. Pt requested food and something to drink. It was explained that he was still nothing by mouth. Pt expressed frustration but understood. Mayford Knife RN

## 2020-11-22 NOTE — Progress Notes (Signed)
Patient ID: [name] [name], male   DOB: Sep 06, 1946, 75 y.o.   MRN: [id number] BP 110/73   Pulse (!) 57   Temp 98.8 F (37.1 C) (Oral)   Resp 17   Ht 6\' 2"  (1.88 m)   Wt 108 kg   SpO2 95%   BMI 30.57 kg/m  Alert and oriented x4, voice hoarse Moving all extremities Has eaten today, doing well Continue with therapy

## 2020-11-22 NOTE — Consult Note (Incomplete)
Palliative Care Consultation Note Requested by: TRH Reason: Diet orders with Dysphagia and Code Status Orders  Mr. Pauwels is a 75 yo man who is s/p 2 level ACDF on 12/10, developed severe pain post op, left arm weakness and difficulty swallowing and was found to have cord compression and stenosis and was taken back to the OR on 12/11 for posterior decompressive laminectomies C3-C6. On 12/12 he developed hypoxia requiring CCM consultation and was placed on heated high flow with an attempt at diuresis, struggled with very poorly controlled pain, was not able to take any PO due to coughing and secretions. An NG tube was attempted on the evening of 12/12 unsuccessfully and patient went into respiratory arrest and remained on various modes of respiratory support until 12/23 when he spiked a fever and required intubation. He was treated for HAP and extubated 12/29.  He has multiple chronic medical problems including CHF, a history if thyroid cancer with complete thyroidectomy, prostate cancer, COPD and notably he had a significant chronic cough for years prior to admission that is documented in his PCP notes.  Palliative Care was consulted for a goals of care discussion in the setting of ongoing dysphagia with high risk for aspiration PNA and risk for malnutrition due to inadequate PO intake. RN requested a discussion about feeding risks and code status by palliative care because current full code status did not seem to be in line with patient's stated goals. Additionally RN reported that patient has been requesting a diet, refused NG placement and that providers were hesitant to place diet orders with out a DNR order.   Summary of Goals of Care Discussion and Recommendations:  1. Code Status:DNR/LIMITED- Patient open to discussing his preferences for care in the event of another serious decompensation or decline- we discussed the importance of hoping for the best but having a plan in place in case things  unexpectedly got worse for him.   Full scope Medical Treatment for reversible illness  In the event of cardiac or respiratory arrest DO NOT RE-INTUBATE and DO NOT INITIATE CPR- provide all measures to ensure comfort at EOL.  He is limited CODE because in the event of mechanical airway obstruction or mucous plugging the care team should proceed with beg mask ventilation and attempt to clear airway. He has OSA and uses CPAP at home prior to admission.  2. Dysphagia: Reviewed esophagram and other tests/SLP data. The etiology of his dysphagia is not entirely clear and may be multifactorial- he had a chronic cough prior to admission which suggest he had some type of bronchospasm or laryngospasm trigger even before surgery. He has a concerning history of thyroid cancer and prior radical resection. He is current smoker and drinks wine daily.  Need continued medical service evaluation- there is no contrasted imaging of his chest and he is an active daily smoker and drinks moderately. He needs a contrasted lung CT for the chronic cough, lung cancer screening and dysphagia. The only available CT is non-contrasted and

## 2020-11-22 NOTE — Consult Note (Signed)
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 On 12/12 he developed hypoxia requiring CCM consultation and was placed on heated high flow with an attempt at diuresis, struggled with very poorly controlled pain, was not able to take any PO due to coughing and secretions. An NG tube was attempted on the evening of 12/12 unsuccessfully and patient went into respiratory arrest and remained on various modes of respiratory support until 12/23 when he spiked a fever and required intubation. He was treated for HAP and extubated 12/29.  He has multiple chronic medical problems including CHF, a history if thyroid cancer with complete thyroidectomy, prostate cancer, COPD and notably he had a significant chronic cough for years prior to admission that is documented in his PCP notes.  Palliative Care was consulted for a goals of care discussion in the setting of ongoing dysphagia with high risk for aspiration PNA and risk for malnutrition due to inadequate PO intake. RN requested a discussion about feeding risks and code status by palliative care because current full code status did not seem to be in line with patient's stated goals. Additionally RN reported that patient has been requesting a diet, refused NG placement and that providers were hesitant to place diet orders with out a DNR order.   Summary of Goals of Care Discussion and Recommendations:  1. Code Status:DNR/LIMITED- Patient open to discussing his preferences for care in the event of another serious decompensation or decline- we discussed the importance of hoping for the best but having a plan in place in case things  unexpectedly got worse for him.   Full scope Medical Treatment for reversible illness  In the event of cardiac or respiratory arrest DO NOT RE-INTUBATE and DO NOT INITIATE CPR- provide all measures to ensure comfort at EOL.  He is limited CODE because in the event of mechanical airway obstruction or mucous plugging the care team should proceed with beg mask ventilation and attempt to clear airway. He has OSA and uses CPAP at home prior to admission.  LIMITED CODE STATUS ORDER  2. Dysphagia: Reviewed esophagram and other tests/SLP data. The etiology of his dysphagia is not entirely clear and may be multifactorial- he had a chronic cough prior to admission which suggest he had some type of bronchospasm or laryngospasm trigger even before surgery. He has a concerning history of thyroid cancer and prior radical resection. He is current smoker and drinks wine daily.  Need continued medical service evaluation- there is no contrasted imaging of his chest and he is an active daily smoker and drinks moderately. He needs a contrasted lung CT for the chronic cough, lung cancer screening and dysphagia. The only available CT is non-contrasted and from almost 2 years ago.  He has oropharyngeal thrush- needs prolonged course of fluconazole due to possible spread to esophagus from tubes etc. May be impacting his swallowing.  He was able to do a chin tuck and this dramatically improved his swallowing while I was at the bedside today.  I observed him drink water and eat an icee without any complications. I cleared him to have a regular diet- will start soft and progress as tolerated.  He does not need artificial nutrition nor would he ever elect for long term artifical nutrition,  I support his desire to not have another NG tube placed and I do not believe it is necessary and could pose more risk than benefit.   SOFT DIET  SUPERVISION WITH MEALS, GOOD POSITIONING, OOB WHEN POSSIBLE  ADVANCE AS  TOLERATED  NO ARTIFICIAL FEEDING OR NG TUBES  REVIEWED ASPIRATION RISKS AND CONCERNS WITH PATIENT  CHANGED ALL ORDERED MEDS FROM PER TUBE TO PO, DISCONTINUED NON ESSENTIAL MEDICATIONS (LARGE VITAMINS ETC)   3. 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 UPPER BACK and NECK. Patient complained of significant upper back and neck spasm while I was evaluating him. He had palpable spasm in bilateral trapezius muscles-reduced ROM and was tender to gentle palpation of his upper back. He was not given PO pain meds due to aspiration risk so pain has ben escalating throughout the day.   SCHEDULED (NOT PRN) TYLENOL 1000mg  PO TID  OXYCODONE as order PO PRN  ADDED IV FENTANYL NURSE PUSH FOR SEVERE PAIN  KPAD for HEAT and COMFORT  CHANGED NEURONTIN DOSING TO 600mg  BID  ADDED ROBAXIN PRN AND REQUESTED RN GIVE A PRN DOSE NOW.   4. Chronic Cough/CardioPulmonary Disease/OSA: Patient has significant crackles on exam and had clear frothy sputum. He was able to cough well and mobilize the secretions in his throat. He has known heart failure and was on Lasix 40mg  PO daily prior to admission. He could also have silent refluz causing the cough and increase in secretions.   CONSIDER ADDING HIS HOME LASIX DOSE  ROBINUL tablet PRN oral secretions  MUCINEX 600mg  BID  FLONASE DAILY  CONTINUE PTA nebulizers and COPD medications.  START PPI OR H2 BLOCKER  5. Insomnia/Delirium/Anxiety: The anxiety and insomnia are chronic problems-the poor sleep coupled with his infection, anesthesia, pain, prolonged hospital stay are all major delirium risk factors.   Patient was on Valium QHS for many years PTA-this was not continued in the hospital orders. Will restart Valium 5mg  qhs  for sleep and muscle spasm and anxiety.  Trazadone 100mg  PO QHS   Patient has an advance directive will located this and have it scanned into his medical chart.  Patient's goals are recovery  and rehab- he knows he has a long road ahead but he is motivated- he may do better with the additional pain control and I will evaluate him for his response to these changes.  Reconsider CIR?  , DO Palliative Medicine  Time: 70 minutes Greater than 50%  of this time was spent counseling and coordinating care related to the above assessment and plan.

## 2020-11-22 NOTE — Progress Notes (Signed)
Physical Therapy Treatment Patient Details Name: [name] [name] MRN: [id number] DOB: [birth year] Today's Date: 11/22/2020    History of Present Illness Patient is a 75 y/o male who presents s/p C3-4, 4-5 ACDF 12/10 with post op LUE weakness requiring C3-6 posterior decompressive lami 12/11 complicated by post op hypoxemia. Reintubated on 12/23-12/29. PMH includes COPD, prostata CA, HTN, depression, CHF.    PT Comments    Pt is progressing slowly toward goals, limited mostly by his fears of falling, but also by fatigue and pain.  Emphasis on transitions to EOB, scooting and sit to stands at EOB with work on pregait activity.   Follow Up Recommendations  Supervision/Assistance - 24 hour;SNF     Equipment Recommendations  None recommended by PT    Recommendations for Other Services       Precautions / Restrictions Precautions Precautions: Fall;Cervical Precaution Comments: extubated 12/29    Mobility  Bed Mobility Overal bed mobility: Needs Assistance Bed Mobility: Rolling;Sidelying to Sit;Sit to Sidelying Rolling: Min assist (heavy reliance of rail, max for sequencing) Sidelying to sit: +2 for physical assistance;Mod assist     Sit to sidelying: Mod assist;+2 for physical assistance General bed mobility comments: cues for sequencing, truncal and LE assist for transitions.  Transfers Overall transfer level: Needs assistance Equipment used:  (chair back) Transfers: Sit to/from Stand Sit to Stand: Mod assist;+2 physical assistance        Lateral/Scoot Transfers: Max assist;+2 safety/equipment General transfer comment: cues for hand placement, assist to come forward and boost up.  Ambulation/Gait                 Stairs             Wheelchair Mobility    Modified Rankin (Stroke Patients Only)       Balance Overall balance assessment: Needs assistance Sitting-balance support: Single extremity supported;No upper extremity supported;Feet  supported Sitting balance-Leahy Scale: Fair Sitting balance - Comments: work on upright and foward sitting without use of UE's for short periods Postural control: Posterior lean Standing balance support: During functional activity Standing balance-Leahy Scale: Poor Standing balance comment: mod to max +2 over 3 sit to stands at EOB supported by chair back.  Worked on w/shifting, knee control with lock/unlocking and low amplitude stepping in place and to L/R.                            Cognition Arousal/Alertness: Awake/alert Behavior During Therapy: WFL for tasks assessed/performed Overall Cognitive Status: Impaired/Different from baseline Area of Impairment: Awareness;Problem solving;Safety/judgement;Memory;Following commands                 Orientation Level: Situation;Place Current Attention Level: Selective Memory: Decreased recall of precautions;Decreased short-term memory Following Commands: Follows one step commands with increased time Safety/Judgement: Decreased awareness of safety;Decreased awareness of deficits Awareness: Emergent Problem Solving: Slow processing;Decreased initiation;Requires verbal cues;Requires tactile cues;Difficulty sequencing        Exercises Other Exercises Other Exercises: AROM with graded resistance x5 hip/knee flex/ext. Other Exercises: able to raise arms up in the air and side to side for several reps prior to mobilizing.    General Comments General comments (skin integrity, edema, etc.): vss      Pertinent Vitals/Pain Pain Assessment: Faces Faces Pain Scale: Hurts even more Pain Location: neck Pain Descriptors / Indicators: Grimacing;Discomfort;Sore    Home Living  Prior Function            PT Goals (current goals can now be found in the care plan section) Acute Rehab PT Goals Patient Stated Goal: home PT Goal Formulation: With patient Time For Goal Achievement: 11/26/20 Potential  to Achieve Goals: Fair Progress towards PT goals: Progressing toward goals (slow progression)    Frequency    Min 3X/week      PT Plan Discharge plan needs to be updated    Co-evaluation PT/OT/SLP Co-Evaluation/Treatment: Yes Reason for Co-Treatment: For patient/therapist safety PT goals addressed during session: Mobility/safety with mobility;Strengthening/ROM        AM-PAC PT "6 Clicks" Mobility   Outcome Measure  Help needed turning from your back to your side while in a flat bed without using bedrails?: A Lot Help needed moving from lying on your back to sitting on the side of a flat bed without using bedrails?: A Lot Help needed moving to and from a bed to a chair (including a wheelchair)?: A Lot Help needed standing up from a chair using your arms (e.g., wheelchair or bedside chair)?: A Lot Help needed to walk in hospital room?: Total Help needed climbing 3-5 steps with a railing? : Total 6 Click Score: 10    End of Session Equipment Utilized During Treatment: Gait belt Activity Tolerance: Patient limited by fatigue;Other (comment) (limited by fears of falling) Patient left: in bed;with call bell/phone within reach;with bed alarm set Nurse Communication: Mobility status;Need for lift equipment PT Visit Diagnosis: Other abnormalities of gait and mobility (R26.89);Muscle weakness (generalized) (M62.81);Pain Pain - part of body:  (neck)     Time: [id number] PT Time Calculation (min) (ACUTE ONLY): 24 min  Charges:  $Therapeutic Activity: 8-22 mins                     11/22/2020  [name], PT Acute Rehabilitation Services [phone]  (pager) [phone]  (office)   Tessie Fass Elvira Langston 11/22/2020, 12:44 PM

## 2020-11-22 NOTE — Progress Notes (Signed)
Pt unable to wear CPAP d/t large pressure ulcer on pts nose. Pt respiratory status stable on 2 Lpm [place] w/no distress noted. RT will continue to monitor.

## 2020-11-22 NOTE — Progress Notes (Signed)
Occupational Therapy Treatment Patient Details Name: [name] [name] MRN: [id number] DOB: 08/28/1946 Today's Date: 11/22/2020    History of present illness Patient is a 75 y/o male who presents s/p C3-4, 4-5 ACDF 12/10 with post op LUE weakness requiring C3-6 posterior decompressive lami 12/11 complicated by post op hypoxemia. Reintubated on 12/23-12/29. PMH includes COPD, prostata CA, HTN, depression, CHF.   OT comments  This 75 yo male admitted with above presents to acute OT today making progress with precursors to ADLs with overall mobility (bed, sit<>stand) but remains with low activity tolerance and fear of falling (which may be playing into the low activity tolerance due to saying he can't or needs to sit down). He will continue to benefit from acute OT with follow up on CIR.  Follow Up Recommendations  CIR;Supervision/Assistance - 24 hour    Equipment Recommendations  3 in 1 bedside commode       Precautions / Restrictions Precautions Precautions: Fall;Cervical Precaution Comments: extubated 12/29 Restrictions Weight Bearing Restrictions: No       Mobility Bed Mobility Overal bed mobility: Needs Assistance Bed Mobility: Rolling;Sidelying to Sit;Sit to Sidelying Rolling: Min assist (heavy reliance of rail, max for sequencing) Sidelying to sit: +2 for physical assistance;Mod assist     Sit to sidelying: Mod assist;+2 for physical assistance General bed mobility comments: cues for sequencing, truncal and LE assist for transitions.  Transfers Overall transfer level: Needs assistance Equipment used:  (chair back) Transfers: Sit to/from Stand Sit to Stand: Mod assist;+2 physical assistance        Lateral/Scoot Transfers: Max assist;+2 safety/equipment General transfer comment: cues for hand placement, assist to come forward and boost up.    Balance Overall balance assessment: Needs assistance Sitting-balance support: Single extremity supported;No upper extremity  supported;Feet supported Sitting balance-Leahy Scale: Fair Sitting balance - Comments: work on upright and foward sitting without use of UE's for short periods Postural control: Posterior lean Standing balance support: During functional activity Standing balance-Leahy Scale: Poor Standing balance comment: mod to max +2 over 3 sit to stands at EOB supported by chair back.  Worked on w/shifting, knee control with lock/unlocking and low amplitude stepping in place and to L/R.                           ADL either performed or assessed with clinical judgement   DL Overall ADL's : Needs assistance/impaired                           Toilet Transfer Details (indicate cue type and reason): Mod A +2 sit<>stand from raised surface                 Vision Baseline Vision/History: Wears glasses Wears Glasses: At all times Patient Visual Report: No change from baseline            Cognition Arousal/Alertness: Awake/alert Behavior During Therapy: WFL for tasks assessed/performed Overall Cognitive Status: Impaired/Different from baseline Area of Impairment: Awareness;Problem solving;Safety/judgement;Memory;Following commands                 Orientation Level: Situation;Place Current Attention Level: Selective Memory: Decreased recall of precautions;Decreased short-term memory Following Commands: Follows one step commands with increased time Safety/Judgement: Decreased awareness of safety;Decreased awareness of deficits Awareness: Emergent Problem Solving: Slow processing;Decreased initiation;Requires verbal cues;Requires tactile cues;Difficulty sequencing          Exercises Exercises: Other exercises Other Exercises Other  Exercises: AROM with graded resistance x5 hip/knee flex/ext. Other Exercises: able to raise arms up in the air and side to side for several reps prior to mobilizing.      General Comments vss    Pertinent Vitals/ Pain       Pain  Assessment: Faces Faces Pain Scale: Hurts even more Pain Location: neck Pain Descriptors / Indicators: Grimacing;Discomfort;Sore Pain Intervention(s): Limited activity within patient's tolerance;Monitored during session;Repositioned         Frequency  Min 2X/week        Progress Toward Goals  OT Goals(current goals can now be found in the care plan section)  Progress towards OT goals: Progressing toward goals  Acute Rehab OT Goals Patient Stated Goal: to eat and drink OT Goal Formulation: With patient Time For Goal Achievement: 11/29/20 Potential to Achieve Goals: Fair  Plan      Co-evaluation    PT/OT/SLP Co-Evaluation/Treatment: Yes Reason for Co-Treatment: For patient/therapist safety PT goals addressed during session: Mobility/safety with mobility;Strengthening/ROM OT goals addressed during session: Strengthening/ROM      AM-PAC OT "6 Clicks" Daily Activity     Outcome Measure   Help from another person eating meals?: A Lot Help from another person taking care of personal grooming?: A Lot Help from another person toileting, which includes using toliet, bedpan, or urinal?: Total Help from another person bathing (including washing, rinsing, drying)?: A Lot Help from another person to put on and taking off regular upper body clothing?: A Lot Help from another person to put on and taking off regular lower body clothing?: Total 6 Click Score: 10    End of Session Equipment Utilized During Treatment: Gait belt  OT Visit Diagnosis: Unsteadiness on feet (R26.81);Other abnormalities of gait and mobility (R26.89);Muscle weakness (generalized) (M62.81);Pain Pain - part of body:  (neck)   Activity Tolerance Patient limited by pain   Patient Left in bed;with call bell/phone within reach   Nurse Communication Mobility status        Time: [id number] OT Time Calculation (min): 25 min  Charges: OT General Charges $OT Visit: 1 Visit OT Treatments $Self Care/Home  Management : 8-22 mins  [place] [address], OTR/L Acute [employer] Pager [phone] Office [phone]      Almon Register 11/22/2020, 1:37 PM

## 2020-11-22 NOTE — TOC Progression Note (Signed)
Transition of Care [facility]) - Progression Note    Patient Details  Name: [name] [name] MRN: [id number] Date of Birth: 1946/10/20  [facility] CM/SW Contact  Eduard Roux, [place] Phone Number: 11/22/2020, 6:09 PM  Clinical Narrative:     CSW gave patient's sister, Harriett Sine, bed offers.   CSW will continue to follow and assist with discharge planning.  Antony Blackbird, MSW, LCSW Clinical Social Worker   Expected Discharge Plan: Skilled Nursing Facility Barriers to Discharge: Continued Medical Work up  Expected Discharge Plan and Services Expected Discharge Plan: Skilled Nursing Facility   Discharge Planning Services: CM Consult   Living arrangements for the past 2 months: Single Family Home                                       Social Determinants of Health (SDOH) Interventions    Readmission Risk Interventions No flowsheet data found.

## 2020-11-23 DIAGNOSIS — N179 Acute kidney failure, unspecified: Secondary | ICD-10-CM | POA: Diagnosis not present

## 2020-11-23 DIAGNOSIS — J9601 Acute respiratory failure with hypoxia: Secondary | ICD-10-CM | POA: Diagnosis not present

## 2020-11-23 DIAGNOSIS — J189 Pneumonia, unspecified organism: Secondary | ICD-10-CM | POA: Diagnosis not present

## 2020-11-23 DIAGNOSIS — I5032 Chronic diastolic (congestive) heart failure: Secondary | ICD-10-CM | POA: Diagnosis not present

## 2020-11-23 LAB — GLUCOSE, CAPILLARY
Glucose-Capillary: 123 mg/dL — ABNORMAL HIGH (ref 70–99)
Glucose-Capillary: 147 mg/dL — ABNORMAL HIGH (ref 70–99)
Glucose-Capillary: 68 mg/dL — ABNORMAL LOW (ref 70–99)
Glucose-Capillary: 76 mg/dL (ref 70–99)
Glucose-Capillary: 84 mg/dL (ref 70–99)
Glucose-Capillary: 93 mg/dL (ref 70–99)
Glucose-Capillary: 93 mg/dL (ref 70–99)

## 2020-11-23 MED ORDER — OXYCODONE HCL 5 MG/5ML PO SOLN
5.0000 mg | ORAL | Status: DC | PRN
  Administered 2020-11-23 – 2020-11-25 (×6): 7.5 mg via ORAL
  Filled 2020-11-23 (×6): qty 10

## 2020-11-23 MED ORDER — ENSURE ENLIVE PO LIQD
237.0000 mL | Freq: Three times a day (TID) | ORAL | Status: DC
  Administered 2020-11-24 – 2020-11-29 (×12): 237 mL via ORAL
  Filled 2020-11-23: qty 237

## 2020-11-23 MED ORDER — ISOSORBIDE DINITRATE 10 MG PO TABS
10.0000 mg | ORAL_TABLET | Freq: Three times a day (TID) | ORAL | Status: DC
  Administered 2020-11-24 – 2020-11-29 (×11): 10 mg via ORAL
  Filled 2020-11-23 (×20): qty 1

## 2020-11-23 MED ORDER — ENSURE ENLIVE PO LIQD
237.0000 mL | Freq: Two times a day (BID) | ORAL | Status: DC
  Administered 2020-11-23: 237 mL via ORAL

## 2020-11-23 NOTE — Progress Notes (Signed)
  Speech Language Pathology Treatment: Dysphagia  Patient Details Name: [name] [name] MRN: [id number] DOB: 24-Dec-1945 Today's Date: 11/23/2020 Time: 4098-1191 SLP Time Calculation (min) (ACUTE ONLY): 23 min  Assessment / Plan / Recommendation Clinical Impression  Noted Palliative care's note from yesterday. He has been accepting of aspiration risks but needed to have conversation re: code status with MD's. He is now a partial code and a soft diet texture, thin liquids are ordered. Therapist reviewed SLP's input and recommendations over past several days with pt and caregiver [name] [name]). Educated re: "controlling the controllable" as it pertains to positioning, frequency and quality of meals, sip/bite size and esophageal precautions. He has a chronic cough with COPD, esophageal dysmotility and intermittent laryngeal penetration with saliva and po's. Consumed thin gingerale, and strawberry icee with delayed throat clears. Pt is for SNF rehab.   HPI HPI: Pt is a 75 yo male s/p anterior cervical discetomy at C3/4 and C5/6 following removal of existing plate, admitted afterwards for sudden onset weakness and pain in his LUE. CT unremarkable; MRI pending. On 12/11 he was made NPO for difficulty swallowing. On 12/11 he underwent C3-6 posterior decompressive laminectomies during which he was intubated. PMH includes: thyroid ca, prostate ca, HTN, HLD, dyspnea, depression, COPD, CHF, anxiety. MBS 11/01/20 recommending Dys 1(puree) and pudding thick liqui. Following day had respiratory decline requiring Bipap until 12/23 when he was intubated and extubated 12/29. New order placed for swallow assessment (considered treat since already on caseload).      SLP Plan  All goals met;Discharge SLP treatment due to (comment)       Recommendations  Diet recommendations: Regular;Thin liquid Liquids provided via: Cup;Straw Medication Administration: Whole meds with puree Supervision: Patient able to self  feed Compensations: Slow rate;Small sips/bites Postural Changes and/or Swallow Maneuvers: Upright 30-60 min after meal;Seated upright 90 degrees                Oral Care Recommendations: Oral care BID Follow up Recommendations: Skilled Nursing facility SLP Visit Diagnosis: Dysphagia, unspecified (R13.10) Plan: All goals met;Discharge SLP treatment due to (comment)       GO                [name] [name] 11/23/2020, 11:54 AM

## 2020-11-23 NOTE — Progress Notes (Signed)
Inpatient Diabetes Program Recommendations  AACE/ADA: New Consensus Statement on Inpatient Glycemic Control (2015)  Target Ranges:  Prepandial:   less than 140 mg/dL      Peak postprandial:   less than 180 mg/dL (1-2 hours)      Critically ill patients:  140 - 180 mg/dL   Lab Results  Component Value Date   GLUCAP 147 (H) 11/23/2020   HGBA1C 6.9 (H) 10/26/2020    Review of Glycemic Control Results for [name] [name], [name] [name] (MRN [id number]) as of 11/23/2020 12:44  Ref. Range 11/22/2020 20:12 11/23/2020 00:16 11/23/2020 03:28 11/23/2020 07:38 11/23/2020 11:49  Glucose-Capillary Latest Ref Range: 70 - 99 mg/dL 126 (H) 84 76 68 (L) 147 (H)    Inpatient Diabetes Program Recommendations:   -Decrease Novolog correction to sensitive tid + hs 0-5 units  Thank you, Bethena Roys E. Bretta Fees, RN, MSN, CDE  Diabetes Coordinator Inpatient Glycemic Control Team Team Pager [phone] (8am-5pm) 11/23/2020 12:45 PM

## 2020-11-23 NOTE — Progress Notes (Signed)
Patient ID: [name] [name], male   DOB: 19-Sep-1946, 75 y.o.   MRN: [id number] BP 116/64 (BP Location: Left Arm)   Pulse (!) 57   Temp 98.1 F (36.7 C) (Axillary)   Resp 15   Ht 6\' 2"  (1.88 m)   Wt 110.8 kg   SpO2 98%   BMI 31.36 kg/m  aLert and oriented x4, speech is clear, fluent Hoarse Has agreed to partial code, but would like to eat Wounds are clean, and dry Continuing to improve

## 2020-11-23 NOTE — Progress Notes (Addendum)
PROGRESS NOTE  [name] [name] TTS:[id number] DOB: April 26, 1946 DOA: 10/26/2020 PCP: Marguarite Arbour, MD  Brief History    [name] [name] is a 75 y.o. year old male with medical history significant for OSA on NIV at home, cervical stenosis admitted on 12/10 for ACDF.  Hospital course was complicated by postoperative left extremity weakness requiring posterior cervical laminectomy on 12/11, postoperative hypoxemia complicated by aspiration pneumonia (treated with Unasyn 12/12-12/17,, mucous plugging requiring BiPAP, systemic steroids worsening bronchospasms and frequent suctioning while awaiting improvement in cough strength with assistance from PCCM was transferred to progressive unit under care of neurosurgical services with [facility] asked to consult for medical comorbidities on 11/18/2020.  Pt continues to have issues with dysphagia and is dependent upon tube feeds. Although it is likely that the patient's recent cervical procedures will preclude EGD at this point, I have consulted GI to evaluate the patient. At their request DG esophagram was ordered. It demonstrated no stricture, but somewhat disordered motility. I appreciate GI's assistance.  SLP re-evaluated the patient and found that he continues to be at considerable risk of aspiration. However, the patient told her that he wanted to be able to eat even if he may aspirate and die. I discussed this with the patient and explained that his status as a full code complicates this and that he would need to be a DNI or DNR if he wished to eat. I consulted palliative care to discuss this with them. Dr. Phillips Odor discussed this with the patient today. He has decided to be a partial code with the only intervention in the case of loss of spontaneous respirations or cardiac activity would be administration of medications for CPR. I will start a diet in accordance with SLP's recommendations.  Procedures  . ACDF 12/10 . Posterior cervical laminectomy  12/11  Antibiotics   Anti-infectives (From admission, onward)   Start     Dose/Rate Route Frequency Ordered Stop   11/22/20 1000  fluconazole (DIFLUCAN) 40 MG/ML suspension 100 mg        100 mg Oral Daily 11/21/20 1305     11/21/20 1400  fluconazole (DIFLUCAN) 40 MG/ML suspension 200 mg        200 mg Oral  Once 11/21/20 1305 11/21/20 1614   11/13/20 1330  fluconazole (DIFLUCAN) IVPB 400 mg  Status:  Discontinued        400 mg 100 mL/hr over 120 Minutes Intravenous Every 24 hours 11/13/20 1230 11/14/20 1626   11/07/20 1700  ceFEPIme (MAXIPIME) 2 g in sodium chloride 0.9 % 100 mL IVPB        2 g 200 mL/hr over 30 Minutes Intravenous Every 8 hours 11/07/20 0935 11/13/20 1843   11/04/20 1000  cefTRIAXone (ROCEPHIN) 2 g in sodium chloride 0.9 % 100 mL IVPB  Status:  Discontinued        2 g 200 mL/hr over 30 Minutes Intravenous Every 24 hours 11/04/20 0903 11/07/20 0933   10/28/20 2100  Ampicillin-Sulbactam (UNASYN) 3 g in sodium chloride 0.9 % 100 mL IVPB  Status:  Discontinued        3 g 200 mL/hr over 30 Minutes Intravenous Every 6 hours 10/28/20 1917 11/02/20 1019   10/27/20 1707  ceFAZolin (ANCEF) 2-4 GM/100ML-% IVPB       Note to Pharmacy: Shireen Quan   : cabinet override      10/27/20 1707 10/28/20 0514   10/27/20 1700  ceFAZolin (ANCEF) IVPB 2g/100 mL premix  Status:  Discontinued  2 g 200 mL/hr over 30 Minutes Intravenous  Once 10/27/20 1658 10/27/20 2224      Subjective  The patient is resting comfortably. The patient is complaining of neck pain.  Objective   Vitals:  Vitals:   11/23/20 0750 11/23/20 1151  BP:  126/70  Pulse:  60  Resp:  18  Temp:  98.1 F (36.7 C)  SpO2: 96% 99%  Exam:  Constitutional:  . The patient is awake and alert. No acute distress. Respiratory:  . No increased work of breathing. . No wheezes, rales, or rhonchi . No tactile fremitus . Diminished breath sounds bilaterally. Cardiovascular:  . Regular rate and rhythm . No  murmurs, ectopy, or gallups. . No lateral PMI. No thrills. Abdomen:  . Abdomen is soft, non-tender, non-distended . No hernias, masses, or organomegaly . Normoactive bowel sounds.  Musculoskeletal:  . No cyanosis, clubbing, or edema Skin:  . No rashes, lesions, ulcers . palpation of skin: no induration or nodules Neurologic:  . CN 2-12 intact . Sensation all 4 extremities intact Psychiatric:  . Mental status o Mood, affect appropriate o Orientation to person, place, time  . judgment and insight appear intact  I have personally reviewed the following:   Today's Data  . Vitals, Glucoses  Micro Data  . Respiratory culture: No growth  Imaging  . CXR 11/15/2020 . Renal ultrasound  Scheduled Meds: . acetaminophen  1,000 mg Oral TID  . arformoterol  15 mcg Nebulization BID  . atorvastatin  40 mg Oral Daily  . chlorhexidine  15 mL Mouth Rinse BID  . Chlorhexidine Gluconate Cloth  6 each Topical Daily  . collagenase   Topical Daily  . diazepam  5 mg Oral QHS  . feeding supplement  237 mL Oral BID BM  . fluconazole  100 mg Oral Daily  . FLUoxetine  40 mg Oral Daily  . fluticasone  2 spray Each Nare Daily  . gabapentin  600 mg Per Tube BID  . guaiFENesin  600 mg Oral BID  . heparin injection (subcutaneous)  5,000 Units Subcutaneous Q8H  . insulin aspart  0-20 Units Subcutaneous Q4H  . insulin aspart  6 Units Subcutaneous TID WC  . insulin detemir  30 Units Subcutaneous BID  . isosorbide dinitrate  10 mg Oral TID  . levothyroxine  175 mcg Oral QAC breakfast  . mouth rinse  15 mL Mouth Rinse q12n4p  . multivitamin with minerals  1 tablet Oral Daily  . pantoprazole  40 mg Oral Daily  . revefenacin  175 mcg Nebulization Daily  . senna-docusate  1 tablet Oral QHS  . sodium chloride flush  10-40 mL Intracatheter Q12H  . traZODone  100 mg Oral QHS   Continuous Infusions: . sodium chloride 10 mL/hr at 11/17/20 0600    Active Problems:   Acute respiratory failure (HCC)    Acute exacerbation of chronic obstructive pulmonary disease (COPD) (HCC)   Major depressive disorder   Hypothyroidism   OSA (obstructive sleep apnea)   HTN (hypertension)   HLD (hyperlipidemia)   CHF (congestive heart failure) (HCC)   Left cervical radiculopathy   Acute cervical radiculopathy   Pressure injury of skin   HCAP (healthcare-associated pneumonia)   Mucus plugging of bronchi   Esophageal dysphagia   Elevated BUN   AKI (acute kidney injury) ([place])   LOS: 28 days    A & P   Acute on chronic hypoxemic/hypercarbic respiratory failure, multifactorial etiology, improving.Marland Kitchen  COPD exacerbation resolved.  Completed  antibiotic course for Proteus vulgaris pneumonia.  Still high aspiration risk given dysphagia and hospital-acquired delirium further complicated by mucous plugging and atelectasis. Currently the patient is saturating 100% on 5 L . He will continue to receive supportive care with flutter valve, incentive spirometry, scheduled inhalers. He is also to receive chest physiotherapy, suctioning, continue O2 goal SPO2 greater than 88.  Nonoliguric AKI. Resolved with IV fljuids.  Suspect prerenal in the setting of some recent low BP (nadir of 87/52 on 1/1) with hardly any p.o. intake though patient is receiving tube feeds complicated by IV lasix use for his CHF as well as home losartan  Baseline creatinine 0.8-1.1, currently 0.79 with a BUN of 70.  Normal bladder with no hydronephrosis on renal ultrasound suggesting medical renal disease based off echogenicity.  UA unremarkableContinue to monitor creatinine, electrolytes, and volume status. Avoid nephrotoxins and hypotension.  Dysphagia: Concern for achalasia vs esophageal stricture. 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  Status post modified barium swallow evaluation by speech. The patient is full assist for feeding, nectar thick liquids twice a day. The patient has a known  history of stricture s/p unsuccessful dilatation. Pt continues to have issues with dysphagia and is dependent upon tube feeds post operatively. Although it is likely that the patient's recent cervical procedures will preclude EGD at this point, I have consulted GI to evaluate the patient. At their request DG esophagram was performed. I did not demonstrate stricture, but did demonstrate dysmotiity. GI assistance is appreciated. SLP re-evaluated the patient and found that he continues to be at considerable risk of aspiration. However, the patient told her that he wanted to be able to eat even if he may aspirate and die. I discussed this with the patient and explained that his status as a full code complicates this and that he would need to be a DNI or DNR if he wished to eat. I consulted palliative care to discuss this with them. Dr. Hilma Favors discussed this with the patient today. He has decided to be a partial code with the only intervention in the case of loss of spontaneous respirations or cardiac activity would be administration of medications for CPR. I will start a diet in accordance with SLP's recommendations.  Encephalopathy: Improving daily. Alert and oriented to self and place this morning, but a little confused. Continue delirium precautions including frequent orientation, minimizing sedating medications. Continue scheduled Seroquel and trazodone, Prozac.  Hypoglycemia: Pt is no longer receiving tube feeds. He is now on a soft diet as well as tid nutritional supplements. Will check 0300 glucose as well.  Hypothyroidism: Continue Synthroid  Chronic diastolic CHF: Noted. Compensated. The patient appears euvolemic. Monitor volume status. Hold diuretics for now.   HTN: Currently normotensive.  Did have nadir of 80s over 50s on 1/1. Home losartan held given AKI. Verapamil not ordered on admission. Monitor.  HLD: Continue statin as at home.  Type 2 diabetes:  A1c 6.9.  CBGs currently at goal.  The patient is receivingLevemir 30 units sq daily with SSI per FSBS on Q4 hr schedule as he is currently receiving tube feeds via cortrack.  OSA: Continue Nightly BiPAP  Leukocytosis:  stable.  Currently 17.8, remains afebrile. Monitor CBC.  Chronic normocytic anemia, stable.  Hemoglobin stable at baseline. Monitor CBC.   Status post ACDF status post laminectomy: Postoperative management per primary (NSG).  I have seen and examined this patient myself. I have spent 32 minutes in his evaluation and care.  Lyndel Dancel, DO [facility] Direct contact: see [email or web address]  7PM-7AM contact night coverage as above 11/23/2020, 4:44 PM  LOS: 24 days

## 2020-11-23 NOTE — Progress Notes (Signed)
Initial Nutrition Assessment  DOCUMENTATION CODES:   Not applicable  INTERVENTION:  Provide Ensure Enlive po BID, each supplement provides 350 kcal and 20 grams of protein  Encourage adequate PO intake.   NUTRITION DIAGNOSIS:   Inadequate oral intake related to inability to eat as evidenced by NPO status; diet advanced  GOAL:   Patient will meet greater than or equal to 90% of their needs; progressing  MONITOR:   PO intake,Supplement acceptance,Skin,Weight trends,Labs,I & O's  REASON FOR ASSESSMENT:   Consult Enteral/tube feeding initiation and management  ASSESSMENT:   Pt with PMH of cervial stenosis, CHF, OSA, and COPD admitted for ACDF on 12/10.  12/10 ACDF 12/11 s/p decompressive laminectomies  12/12 pt with hypoxia due to left pleural effusion and atelectasis  12/13 cortrak placed  12/17 requiring BiPAP support  12/23 intubated 12/29 extubated, TF d/c'ed 12/30 SLP eval, NPO, TF resumed 1/4 Cortrak NGT clogged, removed  Pt refusing replacement of NGT and tube feeds. Pt requesting for diet advancement despite aspiration risk. Palliative consulted with limited code. Diet has been advanced to a soft diet with thin liquids. Pt knowledgeable of risks associated with his high aspiration risk while on po diet. Per Palliative, pt requests no re-intubation and no artifical feeding or NG tubes within his goals of care. RD to order nutritional supplements to aid in caloric and protein needs.  Labs and medications reviewed.   Diet Order:   Diet Order            DIET SOFT Room service appropriate? Yes; Fluid consistency: Thin  Diet effective now                 EDUCATION NEEDS:   No education needs have been identified at this time  Skin:  Skin Assessment: Reviewed RN Assessment Skin Integrity Issues:: Stage II DTI: nose Stage II: perineum, back  Last BM:  1/6  Height:   Ht Readings from Last 1 Encounters:  11/01/20 6\' 2"  (1.88 m)    Weight:   Wt  Readings from Last 1 Encounters:  11/23/20 110.8 kg    BMI:  Body mass index is 31.36 kg/m.  Estimated Nutritional Needs:   Kcal:  2300-2500  Protein:  145-170 grams  Fluid:  2 L/day  Corrin Parker, MS, RD, LDN RD pager number/after hours weekend pager number on Amion.

## 2020-11-24 DIAGNOSIS — J9601 Acute respiratory failure with hypoxia: Secondary | ICD-10-CM | POA: Diagnosis not present

## 2020-11-24 DIAGNOSIS — J189 Pneumonia, unspecified organism: Secondary | ICD-10-CM | POA: Diagnosis not present

## 2020-11-24 DIAGNOSIS — N179 Acute kidney failure, unspecified: Secondary | ICD-10-CM | POA: Diagnosis not present

## 2020-11-24 DIAGNOSIS — I5032 Chronic diastolic (congestive) heart failure: Secondary | ICD-10-CM | POA: Diagnosis not present

## 2020-11-24 LAB — GLUCOSE, CAPILLARY
Glucose-Capillary: 83 mg/dL (ref 70–99)
Glucose-Capillary: 107 mg/dL — ABNORMAL HIGH (ref 70–99)
Glucose-Capillary: 85 mg/dL (ref 70–99)
Glucose-Capillary: 104 mg/dL — ABNORMAL HIGH (ref 70–99)
Glucose-Capillary: 108 mg/dL — ABNORMAL HIGH (ref 70–99)

## 2020-11-24 NOTE — Progress Notes (Signed)
Pt unable to wear CPAP d/t large pressure ulcer on bridge of nose. RT will continue to monitor.

## 2020-11-24 NOTE — Progress Notes (Signed)
Subjective: The patient is alert and pleasant.  He looks much better than the last time I saw him.  He says he is doing "fine".  Objective: Vital signs in last 24 hours: Temp:  [97.8 F (36.6 C)-98.6 F (37 C)] 97.8 F (36.6 C) (01/08 0749) Pulse Rate:  [48-60] 49 (01/08 0749) Resp:  [13-18] 18 (01/08 0749) BP: (116-136)/(64-71) 129/71 (01/08 0749) SpO2:  [93 %-99 %] 99 % (01/08 0823) Weight:  [113.2 kg] 113.2 kg (01/08 0500) Estimated body mass index is 32.04 kg/m as calculated from the following:   Height as of this encounter: 6\' 2"  (1.88 m).   Weight as of this encounter: 113.2 kg.   Intake/Output from previous day: 01/07 0701 - 01/08 0700 In: 80 [I.V.:80] Out: 100 [Urine:100] Intake/Output this shift: No intake/output data recorded.  Physical exam patient is alert and pleasant.  He is moving all 4 extremities.  Lab Results: No results for input(s): WBC, HGB, HCT, PLT in the last 72 hours. BMET No results for input(s): NA, K, CL, CO2, GLUCOSE, BUN, CREATININE, CALCIUM in the last 72 hours.  Studies/Results: DG CHEST PORT 1 VIEW  Result Date: 11/22/2020 CLINICAL DATA:  CHF EXAM: PORTABLE CHEST 1 VIEW COMPARISON:  11/15/2020 and prior radiographs FINDINGS: Cardiomegaly and slightly improved mild pulmonary vascular congestion noted. Bibasilar atelectasis/opacities are noted, improved on the LEFT. A RIGHT PICC line is again noted tip overlying the LOWER SVC. A small bore feeding tube has been removed. IMPRESSION: 1. Improved LEFT basilar atelectasis/opacities. 2. Slightly improved mild pulmonary vascular congestion. Electronically Signed   By: Margarette Canada M.D.   On: 11/22/2020 19:03    Assessment/Plan: Postop day #28: Continue PT OT.  LOS: 29 days     Ophelia Charter 11/24/2020, 9:13 AM

## 2020-11-24 NOTE — Progress Notes (Signed)
PROGRESS NOTE  [name] [name] [id number] DOB: [birth year] DOA: 10/26/2020 PCP: Idelle Crouch, MD  Brief History    [name] [name] is a 75 y.o. year old male with medical history significant for OSA on NIV at home, cervical stenosis admitted on 12/10 for ACDF.  Hospital course was complicated by postoperative left extremity weakness requiring posterior cervical laminectomy on 12/11, postoperative hypoxemia complicated by aspiration pneumonia (treated with Unasyn 12/12-12/17,, mucous plugging requiring BiPAP, systemic steroids worsening bronchospasms and frequent suctioning while awaiting improvement in cough strength with assistance from PCCM was transferred to progressive unit under care of neurosurgical services with [facility] hospitalist asked to consult for medical comorbidities on 11/18/2020.  Pt continues to have issues with dysphagia and is dependent upon tube feeds. Although it is likely that the patient's recent cervical procedures will preclude EGD at this point, I have consulted GI to evaluate the patient. At their request DG esophagram was ordered. It demonstrated no stricture, but somewhat disordered motility. I appreciate GI's assistance.  SLP re-evaluated the patient and found that he continues to be at considerable risk of aspiration. However, the patient told her that he wanted to be able to eat even if he may aspirate and die. I discussed this with the patient and explained that his status as a full code complicates this and that he would need to be a DNI or DNR if he wished to eat. I consulted palliative care to discuss this with them. [name] [name] discussed this with the patient today. He has decided to be a partial code with the only intervention in the case of loss of spontaneous respirations or cardiac activity would be administration of medications for CPR. The patient has been started on a soft diet. He is tolerating it well.  Procedures  . ACDF 12/10 . Posterior cervical  laminectomy 12/11  Antibiotics   Anti-infectives (From admission, onward)   Start     Dose/Rate Route Frequency Ordered Stop   11/22/20 1000  fluconazole (DIFLUCAN) 40 MG/ML suspension 100 mg        100 mg Oral Daily 11/21/20 1305     11/21/20 1400  fluconazole (DIFLUCAN) 40 MG/ML suspension 200 mg        200 mg Oral  Once 11/21/20 1305 11/21/20 1614   11/13/20 1330  fluconazole (DIFLUCAN) IVPB 400 mg  Status:  Discontinued        400 mg 100 mL/hr over 120 Minutes Intravenous Every 24 hours 11/13/20 1230 11/14/20 1626   11/07/20 1700  ceFEPIme (MAXIPIME) 2 g in sodium chloride 0.9 % 100 mL IVPB        2 g 200 mL/hr over 30 Minutes Intravenous Every 8 hours 11/07/20 0935 11/13/20 1843   11/04/20 1000  cefTRIAXone (ROCEPHIN) 2 g in sodium chloride 0.9 % 100 mL IVPB  Status:  Discontinued        2 g 200 mL/hr over 30 Minutes Intravenous Every 24 hours 11/04/20 0903 11/07/20 0933   10/28/20 2100  Ampicillin-Sulbactam (UNASYN) 3 g in sodium chloride 0.9 % 100 mL IVPB  Status:  Discontinued        3 g 200 mL/hr over 30 Minutes Intravenous Every 6 hours 10/28/20 1917 11/02/20 1019   10/27/20 1707  ceFAZolin (ANCEF) 2-4 GM/100ML-% IVPB       Note to Pharmacy: Henrine Screws   : cabinet override      10/27/20 1707 10/28/20 0514   10/27/20 1700  ceFAZolin (ANCEF) IVPB 2g/100 mL premix  Status:  Discontinued        2 g 200 mL/hr over 30 Minutes Intravenous  Once 10/27/20 1658 10/27/20 2224      Subjective  The patient is resting comfortably. No new complaints.  Objective   Vitals:  Vitals:   11/24/20 1525 11/24/20 1600  BP: 121/67   Pulse: (!) 51   Resp: 15   Temp: 98.2 F (36.8 C) 98.3 F (36.8 C)  SpO2:    Exam:  Constitutional:  . The patient is awake and alert. No acute distress. Respiratory:  . No increased work of breathing. . No wheezes, rales, or rhonchi . No tactile fremitus Cardiovascular:  . Regular rate and rhythm . No murmurs, ectopy, or gallups. . No lateral  PMI. No thrills. Abdomen:  . Abdomen is soft, non-tender, non-distended . No hernias, masses, or organomegaly . Normoactive bowel sounds.  Musculoskeletal:  . No cyanosis, clubbing, or edema Skin:  . No rashes, lesions, ulcers . palpation of skin: no induration or nodules Neurologic:  . CN 2-12 intact . Sensation all 4 extremities intact Psychiatric:  . Mental status o Mood, affect appropriate o Orientation to person, place, time  . judgment and insight appear intact  I have personally reviewed the following:   Today's Data  . Vitals, Glucoses  Micro Data  . Respiratory culture: No growth  Imaging  . CXR 11/15/2020 . Renal ultrasound  Scheduled Meds: . acetaminophen  1,000 mg Oral TID  . arformoterol  15 mcg Nebulization BID  . atorvastatin  40 mg Oral Daily  . chlorhexidine  15 mL Mouth Rinse BID  . Chlorhexidine Gluconate Cloth  6 each Topical Daily  . collagenase   Topical Daily  . diazepam  5 mg Oral QHS  . feeding supplement  237 mL Oral TID BM  . fluconazole  100 mg Oral Daily  . FLUoxetine  40 mg Oral Daily  . fluticasone  2 spray Each Nare Daily  . gabapentin  600 mg Per Tube BID  . guaiFENesin  600 mg Oral BID  . heparin injection (subcutaneous)  5,000 Units Subcutaneous Q8H  . insulin aspart  0-20 Units Subcutaneous Q4H  . insulin aspart  6 Units Subcutaneous TID WC  . insulin detemir  30 Units Subcutaneous BID  . isosorbide dinitrate  10 mg Oral TID  . levothyroxine  175 mcg Oral QAC breakfast  . mouth rinse  15 mL Mouth Rinse q12n4p  . multivitamin with minerals  1 tablet Oral Daily  . pantoprazole  40 mg Oral Daily  . revefenacin  175 mcg Nebulization Daily  . senna-docusate  1 tablet Oral QHS  . sodium chloride flush  10-40 mL Intracatheter Q12H  . traZODone  100 mg Oral QHS   Continuous Infusions: . 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  Completed antibiotic course for Proteus vulgaris pneumonia.  Still high aspiration risk given dysphagia and hospital-acquired delirium further complicated by mucous plugging and atelectasis. Currently the patient is saturating in the 90's on room air. He will continue to receive supportive care with flutter valve, incentive spirometry, scheduled inhalers. He is also to receive chest physiotherapy, suctioning, continue O2 goal SPO2 greater than 88.  Nonoliguric AKI. Resolved with IV fljuids.  Suspect prerenal in the setting of some recent low BP (nadir of 87/52 on 1/1) with hardly any p.o. intake though patient is receiving tube feeds complicated by IV lasix use for his CHF as well as home losartan  Baseline creatinine 0.8-1.1, currently 0.79 with a BUN of 70.  Normal bladder with no hydronephrosis on renal ultrasound suggesting medical renal disease based off echogenicity.  UA unremarkableContinue to monitor creatinine, electrolytes, and volume status. Avoid nephrotoxins and hypotension.  Dysphagia: Concern for achalasia vs esophageal stricture. 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  Status post modified barium swallow evaluation by speech. The patient is full assist for feeding, nectar thick liquids twice a day. The patient has a known history of stricture s/p  unsuccessful dilatation. Pt continues to have issues with dysphagia and is dependent upon tube feeds post operatively. Although it is likely that the patient's recent cervical procedures will preclude EGD at this point, I have consulted GI to evaluate the patient. At their request DG esophagram was performed. I did not demonstrate stricture, but did demonstrate dysmotiity. GI assistance is appreciated. SLP re-evaluated the patient and found that he continues to be at considerable risk of aspiration. However, the patient told her that he wanted to be able to eat even if he may aspirate and die. I discussed this with the patient and explained that his status as a full code complicates this and that he would need to be a DNI or DNR if he wished to eat. I consulted palliative care to discuss this with them. [name] [name] discussed this with the patient today. He has decided to be a partial code with the only intervention in the case of loss of spontaneous respirations or cardiac activity would be administration of medications for CPR. I will start a diet in accordance with SLP's recommendations.  Encephalopathy: Improving daily. Alert and oriented to self and place this morning, but a little confused. Continue delirium precautions including frequent orientation, minimizing sedating medications. Continue scheduled Seroquel and trazodone, Prozac.  Hypoglycemia: Pt is no longer receiving tube feeds. He is now on a soft diet as well as tid nutritional supplements. Will check 0300 glucose as well.  Hypothyroidism: Continue Synthroid  Chronic diastolic CHF: Noted. Compensated. The patient appears euvolemic. Monitor volume status. Hold diuretics for now.   HTN: Currently normotensive.  Did have nadir of 80s over 50s on 1/1. Home losartan held given AKI. Verapamil not ordered on admission. Monitor.  HLD: Continue statin as at home.  Type 2 diabetes:  A1c 6.9.  CBGs currently at goal. The patient is  receivingLevemir 30 units sq daily with SSI per FSBS on Q4 hr schedule as he is currently receiving tube feeds via cortrack.  OSA: Continue Nightly BiPAP  Leukocytosis:  stable.  Currently 17.8, remains afebrile. Monitor CBC.  Chronic normocytic anemia, stable.  Hemoglobin stable at baseline. Monitor CBC.   Status post ACDF status post laminectomy: Postoperative management per primary (NSG).  I have seen and examined this patient myself. I have spent 30  minutes in his evaluation and care.  Jahna Liebert, DO [facility] Direct contact: see [email or web address]  7PM-7AM contact night coverage as above 11/24/2020, 6:04 PM  LOS: 24 days

## 2020-11-25 DIAGNOSIS — I5032 Chronic diastolic (congestive) heart failure: Secondary | ICD-10-CM | POA: Diagnosis not present

## 2020-11-25 DIAGNOSIS — J189 Pneumonia, unspecified organism: Secondary | ICD-10-CM | POA: Diagnosis not present

## 2020-11-25 DIAGNOSIS — N179 Acute kidney failure, unspecified: Secondary | ICD-10-CM | POA: Diagnosis not present

## 2020-11-25 DIAGNOSIS — J9601 Acute respiratory failure with hypoxia: Secondary | ICD-10-CM | POA: Diagnosis not present

## 2020-11-25 LAB — BASIC METABOLIC PANEL
Anion gap: 7 (ref 5–15)
BUN: 15 mg/dL (ref 8–23)
CO2: 24 mmol/L (ref 22–32)
Calcium: 8.4 mg/dL — ABNORMAL LOW (ref 8.9–10.3)
Chloride: 107 mmol/L (ref 98–111)
Creatinine, Ser: 0.78 mg/dL (ref 0.61–1.24)
GFR, Estimated: >60 mL/min (ref >60)
Glucose, Bld: 71 mg/dL (ref 70–99)
Potassium: 3.4 mmol/L — ABNORMAL LOW (ref 3.5–5.1)
Sodium: 138 mmol/L (ref 135–145)

## 2020-11-25 LAB — GLUCOSE, CAPILLARY
Glucose-Capillary: 112 mg/dL — ABNORMAL HIGH (ref 70–99)
Glucose-Capillary: 133 mg/dL — ABNORMAL HIGH (ref 70–99)
Glucose-Capillary: 213 mg/dL — ABNORMAL HIGH (ref 70–99)
Glucose-Capillary: 51 mg/dL — ABNORMAL LOW (ref 70–99)
Glucose-Capillary: 63 mg/dL — ABNORMAL LOW (ref 70–99)
Glucose-Capillary: 70 mg/dL (ref 70–99)
Glucose-Capillary: 72 mg/dL (ref 70–99)
Glucose-Capillary: 74 mg/dL (ref 70–99)
Glucose-Capillary: 83 mg/dL (ref 70–99)

## 2020-11-25 LAB — CBC WITH DIFFERENTIAL/PLATELET
Abs Immature Granulocytes: 0.04 10*3/uL (ref 0.00–0.07)
Basophils Absolute: 0 10*3/uL (ref 0.0–0.1)
Basophils Relative: 0 %
Eosinophils Absolute: 0.2 10*3/uL (ref 0.0–0.5)
Eosinophils Relative: 3 %
HCT: 31 % — ABNORMAL LOW (ref 39.0–52.0)
Hemoglobin: 10 g/dL — ABNORMAL LOW (ref 13.0–17.0)
Immature Granulocytes: 1 %
Lymphocytes Relative: 13 %
Lymphs Abs: 0.9 10*3/uL (ref 0.7–4.0)
MCH: 31 pg (ref 26.0–34.0)
MCHC: 32.3 g/dL (ref 30.0–36.0)
MCV: 96 fL (ref 80.0–100.0)
Monocytes Absolute: 0.6 10*3/uL (ref 0.1–1.0)
Monocytes Relative: 8 %
Neutro Abs: 5.6 10*3/uL (ref 1.7–7.7)
Neutrophils Relative %: 75 %
Platelets: 160 10*3/uL (ref 150–400)
RBC: 3.23 MIL/uL — ABNORMAL LOW (ref 4.22–5.81)
RDW: 14.8 % (ref 11.5–15.5)
WBC: 7.4 10*3/uL (ref 4.0–10.5)
nRBC: 0 % (ref 0.0–0.2)

## 2020-11-25 MED ORDER — DIPHENHYDRAMINE HCL 25 MG PO CAPS
25.0000 mg | ORAL_CAPSULE | Freq: Four times a day (QID) | ORAL | Status: DC | PRN
  Administered 2020-11-25 – 2020-11-29 (×9): 25 mg via ORAL
  Filled 2020-11-25 (×9): qty 1

## 2020-11-25 MED ORDER — OXYCODONE HCL 5 MG/5ML PO SOLN
5.0000 mg | ORAL | Status: DC | PRN
  Administered 2020-11-25 – 2020-11-29 (×12): 10 mg via ORAL
  Filled 2020-11-25 (×12): qty 10

## 2020-11-25 NOTE — Progress Notes (Signed)
Patient unable to tolerate CPAP due to large pressure ulcer on bridge of nose. RT will monitor as needed.

## 2020-11-25 NOTE — Progress Notes (Signed)
Pt CBG at 0400 was 51, pt not exhibiting any hypoglycemic symptoms, given 2 orange juices with extra sugar packets mixed in and CBG checked again 30 minutes later and was 74.  Will continue to monitor for hypoglycemic symptoms

## 2020-11-25 NOTE — Progress Notes (Signed)
Subjective: The patient is alert and pleasant.  Objective: Vital signs in last 24 hours: Temp:  [97.6 F (36.4 C)-98.6 F (37 C)] 98.2 F (36.8 C) (01/09 0800) Pulse Rate:  [49-54] 50 (01/09 0800) Resp:  [14-20] 20 (01/09 0800) BP: (104-128)/(57-68) 128/68 (01/09 0800) SpO2:  [95 %-96 %] 96 % (01/09 0858) Weight:  [114.9 kg] 114.9 kg (01/09 0500) Estimated body mass index is 32.52 kg/m as calculated from the following:   Height as of this encounter: 6\' 2"  (1.88 m).   Weight as of this encounter: 114.9 kg.   Intake/Output from previous day: No intake/output data recorded. Intake/Output this shift: No intake/output data recorded.  Physical exam the patient is alert and pleasant.  He is moving all 4 extremities well.  Lab Results: Recent Labs    11/25/20 0615  WBC 7.4  HGB 10.0*  HCT 31.0*  PLT 160   BMET Recent Labs    11/25/20 0615  NA 138  K 3.4*  CL 107  CO2 24  GLUCOSE 71  BUN 15  CREATININE 0.78  CALCIUM 8.4*    Studies/Results: No results found.  Assessment/Plan: Postop day #29: The patient is stable.  We are awaiting placement.  LOS: 30 days     Ophelia Charter 11/25/2020, 9:48 AM

## 2020-11-25 NOTE — Progress Notes (Signed)
PROGRESS NOTE  [name] [name] [id number] DOB: 06-Feb-1946 DOA: 10/26/2020 PCP: Idelle Crouch, MD  Brief History    [name] [name] is a 75 y.o. year old male with medical history significant for OSA on NIV at home, cervical stenosis admitted on 12/10 for ACDF.  Hospital course was complicated by postoperative left extremity weakness requiring posterior cervical laminectomy on 12/11, postoperative hypoxemia complicated by aspiration pneumonia (treated with Unasyn 12/12-12/17,, mucous plugging requiring BiPAP, systemic steroids worsening bronchospasms and frequent suctioning while awaiting improvement in cough strength with assistance from PCCM was transferred to progressive unit under care of neurosurgical services with [facility] hospitalist asked to consult for medical comorbidities on 11/18/2020.  Pt continues to have issues with dysphagia and is dependent upon tube feeds. Although it is likely that the patient's recent cervical procedures will preclude EGD at this point, I have consulted GI to evaluate the patient. At their request DG esophagram was ordered. It demonstrated no stricture, but somewhat disordered motility. I appreciate GI's assistance.  SLP re-evaluated the patient and found that he continues to be at considerable risk of aspiration. However, the patient told her that he wanted to be able to eat even if he may aspirate and die. I discussed this with the patient and explained that his status as a full code complicates this and that he would need to be a DNI or DNR if he wished to eat. I consulted palliative care to discuss this with them. Dr. Hilma Favors discussed this with the patient today. He has decided to be a partial code with the only intervention in the case of loss of spontaneous respirations or cardiac activity would be administration of medications for CPR. The patient has been started on a soft diet. He is tolerating it well.  Procedures  . ACDF 12/10 . Posterior cervical  laminectomy 12/11  Antibiotics   Anti-infectives (From admission, onward)   Start     Dose/Rate Route Frequency Ordered Stop   11/22/20 1000  fluconazole (DIFLUCAN) 40 MG/ML suspension 100 mg        100 mg Oral Daily 11/21/20 1305     11/21/20 1400  fluconazole (DIFLUCAN) 40 MG/ML suspension 200 mg        200 mg Oral  Once 11/21/20 1305 11/21/20 1614   11/13/20 1330  fluconazole (DIFLUCAN) IVPB 400 mg  Status:  Discontinued        400 mg 100 mL/hr over 120 Minutes Intravenous Every 24 hours 11/13/20 1230 11/14/20 1626   11/07/20 1700  ceFEPIme (MAXIPIME) 2 g in sodium chloride 0.9 % 100 mL IVPB        2 g 200 mL/hr over 30 Minutes Intravenous Every 8 hours 11/07/20 0935 11/13/20 1843   11/04/20 1000  cefTRIAXone (ROCEPHIN) 2 g in sodium chloride 0.9 % 100 mL IVPB  Status:  Discontinued        2 g 200 mL/hr over 30 Minutes Intravenous Every 24 hours 11/04/20 0903 11/07/20 0933   10/28/20 2100  Ampicillin-Sulbactam (UNASYN) 3 g in sodium chloride 0.9 % 100 mL IVPB  Status:  Discontinued        3 g 200 mL/hr over 30 Minutes Intravenous Every 6 hours 10/28/20 1917 11/02/20 1019   10/27/20 1707  ceFAZolin (ANCEF) 2-4 GM/100ML-% IVPB       Note to Pharmacy: Henrine Screws   : cabinet override      10/27/20 1707 10/28/20 0514   10/27/20 1700  ceFAZolin (ANCEF) IVPB 2g/100 mL premix  Status:  Discontinued        2 g 200 mL/hr over 30 Minutes Intravenous  Once 10/27/20 1658 10/27/20 2224      Subjective  The patient is resting comfortably. He is complaining of pain.  Objective   Vitals:  Vitals:   11/25/20 1210 11/25/20 1545  BP:  130/68  Pulse:  (!) 59  Resp:    Temp: 98.4 F (36.9 C) 98.4 F (36.9 C)  SpO2:  95%  Exam:  Constitutional:  . The patient is awake and alert. No acute distress. Respiratory:  . No increased work of breathing. . No wheezes, rales, or rhonchi . No tactile fremitus Cardiovascular:  . Regular rate and rhythm . No murmurs, ectopy, or gallups. . No  lateral PMI. No thrills. Abdomen:  . Abdomen is soft, non-tender, non-distended . No hernias, masses, or organomegaly . Normoactive bowel sounds.  Musculoskeletal:  . No cyanosis, clubbing, or edema Skin:  . No rashes, lesions, ulcers . palpation of skin: no induration or nodules Neurologic:  . CN 2-12 intact . Sensation all 4 extremities intact Psychiatric:  . Mental status o Mood, affect appropriate o Orientation to person, place, time  . judgment and insight appear intact  I have personally reviewed the following:   Today's Data  . Vitals, Glucoses  Micro Data  . Respiratory culture: No growth  Imaging  . CXR 11/15/2020 . Renal ultrasound  Scheduled Meds: . acetaminophen  1,000 mg Oral TID  . arformoterol  15 mcg Nebulization BID  . atorvastatin  40 mg Oral Daily  . chlorhexidine  15 mL Mouth Rinse BID  . Chlorhexidine Gluconate Cloth  6 each Topical Daily  . collagenase   Topical Daily  . diazepam  5 mg Oral QHS  . feeding supplement  237 mL Oral TID BM  . fluconazole  100 mg Oral Daily  . FLUoxetine  40 mg Oral Daily  . fluticasone  2 spray Each Nare Daily  . gabapentin  600 mg Per Tube BID  . guaiFENesin  600 mg Oral BID  . heparin injection (subcutaneous)  5,000 Units Subcutaneous Q8H  . insulin aspart  0-20 Units Subcutaneous Q4H  . insulin aspart  6 Units Subcutaneous TID WC  . insulin detemir  30 Units Subcutaneous BID  . isosorbide dinitrate  10 mg Oral TID  . levothyroxine  175 mcg Oral QAC breakfast  . mouth rinse  15 mL Mouth Rinse q12n4p  . multivitamin with minerals  1 tablet Oral Daily  . pantoprazole  40 mg Oral Daily  . revefenacin  175 mcg Nebulization Daily  . senna-docusate  1 tablet Oral QHS  . sodium chloride flush  10-40 mL Intracatheter Q12H  . traZODone  100 mg Oral QHS   Continuous Infusions: . sodium chloride 10 mL/hr at 11/17/20 0600    Active Problems:   Acute respiratory failure (HCC)   Acute exacerbation of chronic  obstructive pulmonary disease (COPD) (HCC)   Major depressive disorder   Hypothyroidism   OSA (obstructive sleep apnea)   HTN (hypertension)   HLD (hyperlipidemia)   CHF (congestive heart failure) (HCC)   Left cervical radiculopathy   Acute cervical radiculopathy   Pressure injury of skin   HCAP (healthcare-associated pneumonia)   Mucus plugging of bronchi   Esophageal dysphagia   Elevated BUN   AKI (acute kidney injury) ([place])   LOS: 30 days    A & P   Acute on chronic hypoxemic/hypercarbic respiratory failure, multifactorial etiology,  improving.Marland Kitchen  COPD exacerbation resolved.  Completed antibiotic course for Proteus vulgaris pneumonia.  Still high aspiration risk given dysphagia and hospital-acquired delirium further complicated by mucous plugging and atelectasis. Currently the patient is saturating in the 90's on room air. He will continue to receive supportive care with flutter valve, incentive spirometry, scheduled inhalers. He is also to receive chest physiotherapy, suctioning, continue O2 goal SPO2 greater than 88.  Nonoliguric AKI. Resolved with IV fljuids.  Suspect prerenal in the setting of some recent low BP (nadir of 87/52 on 1/1) with hardly any p.o. intake though patient is receiving tube feeds complicated by IV lasix use for his CHF as well as home losartan  Baseline creatinine 0.8-1.1, currently 0.79 with a BUN of 70.  Normal bladder with no hydronephrosis on renal ultrasound suggesting medical renal disease based off echogenicity.  UA unremarkableContinue to monitor creatinine, electrolytes, and volume status. Avoid nephrotoxins and hypotension.  Dysphagia: Concern for achalasia vs esophageal stricture. 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  Status post modified barium swallow evaluation by speech. The patient is full assist for feeding, nectar thick liquids twice a day. The patient has a known history of stricture  s/p unsuccessful dilatation. Pt continues to have issues with dysphagia and is dependent upon tube feeds post operatively. Although it is likely that the patient's recent cervical procedures will preclude EGD at this point, I have consulted GI to evaluate the patient. At their request DG esophagram was performed. I did not demonstrate stricture, but did demonstrate dysmotiity. GI assistance is appreciated. SLP re-evaluated the patient and found that he continues to be at considerable risk of aspiration. However, the patient told her that he wanted to be able to eat even if he may aspirate and die. I discussed this with the patient and explained that his status as a full code complicates this and that he would need to be a DNI or DNR if he wished to eat. I consulted palliative care to discuss this with them. Dr. Hilma Favors discussed this with the patient today. He has decided to be a partial code with the only intervention in the case of loss of spontaneous respirations or cardiac activity would be administration of medications for CPR. I will start a diet in accordance with SLP's recommendations.  Encephalopathy: Improving daily. Alert and oriented to self and place this morning, but a little confused. Continue delirium precautions including frequent orientation, minimizing sedating medications. Continue scheduled Seroquel and trazodone, Prozac.  Hypoglycemia: Pt is no longer receiving tube feeds. He is now on a soft diet as well as tid nutritional supplements. Will check 0300 glucose as well.  Hypothyroidism: Continue Synthroid  Chronic diastolic CHF: Noted. Compensated. The patient appears euvolemic. Monitor volume status. Hold diuretics for now.   HTN: Currently normotensive.  Did have nadir of 80s over 50s on 1/1. Home losartan held given AKI. Verapamil not ordered on admission. Monitor.  HLD: Continue statin as at home.  Type 2 diabetes:  A1c 6.9.  CBGs currently at goal. The patient is  receivingLevemir 30 units sq daily with SSI per FSBS on Q4 hr schedule as he is currently receiving tube feeds via cortrack.  OSA: Continue Nightly BiPAP  Leukocytosis:  stable.  Currently 17.8, remains afebrile. Monitor CBC.  Chronic normocytic anemia, stable.  Hemoglobin stable at baseline. Monitor CBC.   Status post ACDF status post laminectomy: Postoperative management per primary (NSG).  I have seen and examined this patient myself. I have  spent 32 minutes in his evaluation and care.  Lalaine Overstreet, DO [facility] Direct contact: see [email or web address]  7PM-7AM contact night coverage as above 11/25/2020, 4:15 PM  LOS: 24 days

## 2020-11-26 DIAGNOSIS — I5032 Chronic diastolic (congestive) heart failure: Secondary | ICD-10-CM | POA: Diagnosis not present

## 2020-11-26 DIAGNOSIS — N179 Acute kidney failure, unspecified: Secondary | ICD-10-CM | POA: Diagnosis not present

## 2020-11-26 DIAGNOSIS — J189 Pneumonia, unspecified organism: Secondary | ICD-10-CM | POA: Diagnosis not present

## 2020-11-26 DIAGNOSIS — J9601 Acute respiratory failure with hypoxia: Secondary | ICD-10-CM | POA: Diagnosis not present

## 2020-11-26 LAB — BASIC METABOLIC PANEL
Anion gap: 4 — ABNORMAL LOW (ref 5–15)
BUN: 16 mg/dL (ref 8–23)
CO2: 25 mmol/L (ref 22–32)
Calcium: 8.2 mg/dL — ABNORMAL LOW (ref 8.9–10.3)
Chloride: 108 mmol/L (ref 98–111)
Creatinine, Ser: 0.9 mg/dL (ref 0.61–1.24)
GFR, Estimated: >60 mL/min (ref >60)
Glucose, Bld: 96 mg/dL (ref 70–99)
Potassium: 3.3 mmol/L — ABNORMAL LOW (ref 3.5–5.1)
Sodium: 137 mmol/L (ref 135–145)

## 2020-11-26 LAB — GLUCOSE, CAPILLARY
Glucose-Capillary: 82 mg/dL (ref 70–99)
Glucose-Capillary: 106 mg/dL — ABNORMAL HIGH (ref 70–99)
Glucose-Capillary: 112 mg/dL — ABNORMAL HIGH (ref 70–99)
Glucose-Capillary: 156 mg/dL — ABNORMAL HIGH (ref 70–99)
Glucose-Capillary: 100 mg/dL — ABNORMAL HIGH (ref 70–99)

## 2020-11-26 MED ORDER — POTASSIUM CHLORIDE 10 MEQ/100ML IV SOLN
10.0000 meq | INTRAVENOUS | Status: AC
  Administered 2020-11-26 (×4): 10 meq via INTRAVENOUS
  Filled 2020-11-26 (×3): qty 100

## 2020-11-26 NOTE — Progress Notes (Signed)
Patient ID: [name] [name], male   DOB: 10-03-1946, 75 y.o.   MRN: [id number] BP 98/75 (BP Location: Right Arm)   Pulse (!) 57   Temp 98.7 F (37.1 C) (Oral)   Resp 14   Ht 6\' 2"  (1.88 m)   Wt 114.9 kg   SpO2 97%   BMI 32.52 kg/m  Alert and oriented x 4, speech is clear Moving all extremities, left upper < right Wounds are clean, dry, no signs of infection Continue therapies SNF recommended

## 2020-11-26 NOTE — Progress Notes (Signed)
PROGRESS NOTE  [name] [name] EHM:[id number] DOB: [birth year] DOA: 10/26/2020 PCP: Idelle Crouch, MD  Brief History    [name] [name] is a 75 y.o. year old male with medical history significant for OSA on NIV at home, cervical stenosis admitted on 12/10 for ACDF.  Hospital course was complicated by postoperative left extremity weakness requiring posterior cervical laminectomy on 12/11, postoperative hypoxemia complicated by aspiration pneumonia (treated with Unasyn 12/12-12/17,, mucous plugging requiring BiPAP, systemic steroids worsening bronchospasms and frequent suctioning while awaiting improvement in cough strength with assistance from PCCM was transferred to progressive unit under care of neurosurgical services with [facility] asked to consult for medical comorbidities on 11/18/2020.  Pt continues to have issues with dysphagia and is dependent upon tube feeds. Although it is likely that the patient's recent cervical procedures will preclude EGD at this point, I have consulted GI to evaluate the patient. At their request DG esophagram was ordered. It demonstrated no stricture, but somewhat disordered motility. I appreciate GI's assistance.  SLP re-evaluated the patient and found that he continues to be at considerable risk of aspiration. However, the patient told her that he wanted to be able to eat even if he may aspirate and die. I discussed this with the patient and explained that his status as a full code complicates this and that he would need to be a DNI or DNR if he wished to eat. I consulted palliative care to discuss this with them. Dr. Hilma Favors discussed this with the patient today. He has decided to be a partial code with the only intervention in the case of loss of spontaneous respirations or cardiac activity would be administration of medications for CPR. The patient has been started on a soft diet. He is tolerating it well.  The patient hs been evaluated by PT/OT. They have  recommended SNF vs home with 24/7 supervision and assistance.  Procedures  . ACDF 12/10 . Posterior cervical laminectomy 12/11  Antibiotics   Anti-infectives (From admission, onward)   Start     Dose/Rate Route Frequency Ordered Stop   11/22/20 1000  fluconazole (DIFLUCAN) 40 MG/ML suspension 100 mg        100 mg Oral Daily 11/21/20 1305     11/21/20 1400  fluconazole (DIFLUCAN) 40 MG/ML suspension 200 mg        200 mg Oral  Once 11/21/20 1305 11/21/20 1614   11/13/20 1330  fluconazole (DIFLUCAN) IVPB 400 mg  Status:  Discontinued        400 mg 100 mL/hr over 120 Minutes Intravenous Every 24 hours 11/13/20 1230 11/14/20 1626   11/07/20 1700  ceFEPIme (MAXIPIME) 2 g in sodium chloride 0.9 % 100 mL IVPB        2 g 200 mL/hr over 30 Minutes Intravenous Every 8 hours 11/07/20 0935 11/13/20 1843   11/04/20 1000  cefTRIAXone (ROCEPHIN) 2 g in sodium chloride 0.9 % 100 mL IVPB  Status:  Discontinued        2 g 200 mL/hr over 30 Minutes Intravenous Every 24 hours 11/04/20 0903 11/07/20 0933   10/28/20 2100  Ampicillin-Sulbactam (UNASYN) 3 g in sodium chloride 0.9 % 100 mL IVPB  Status:  Discontinued        3 g 200 mL/hr over 30 Minutes Intravenous Every 6 hours 10/28/20 1917 11/02/20 1019   10/27/20 1707  ceFAZolin (ANCEF) 2-4 GM/100ML-% IVPB       Note to Pharmacy: Henrine Screws   : cabinet override  10/27/20 1707 10/28/20 0514   10/27/20 1700  ceFAZolin (ANCEF) IVPB 2g/100 mL premix  Status:  Discontinued        2 g 200 mL/hr over 30 Minutes Intravenous  Once 10/27/20 1658 10/27/20 2224      Subjective  The patient is resting comfortably. No new complaints.  Objective   Vitals:  Vitals:   11/26/20 0817 11/26/20 1100  BP:  115/67  Pulse:  (!) 57  Resp:  14  Temp:  97.8 F (36.6 C)  SpO2: 97% 97%  Exam:  Constitutional:  . The patient is awake and alert. No acute distress. Respiratory:  . No increased work of breathing. . No wheezes, rales, or rhonchi . No tactile  fremitus Cardiovascular:  . Regular rate and rhythm . No murmurs, ectopy, or gallups. . No lateral PMI. No thrills. Abdomen:  . Abdomen is soft, non-tender, non-distended . No hernias, masses, or organomegaly . Normoactive bowel sounds.  Musculoskeletal:  . No cyanosis, clubbing, or edema Skin:  . No rashes, lesions, ulcers . palpation of skin: no induration or nodules Neurologic:  . CN 2-12 intact . Sensation all 4 extremities intact Psychiatric:  . Mental status o Mood, affect appropriate o Orientation to person, place, time  . judgment and insight appear intact  I have personally reviewed the following:   Today's Data  . Vitals, BMP, Glucoses  Micro Data  . Respiratory culture: No growth  Imaging  . CXR 11/15/2020 . Renal ultrasound  Scheduled Meds: . acetaminophen  1,000 mg Oral TID  . arformoterol  15 mcg Nebulization BID  . atorvastatin  40 mg Oral Daily  . chlorhexidine  15 mL Mouth Rinse BID  . Chlorhexidine Gluconate Cloth  6 each Topical Daily  . collagenase   Topical Daily  . diazepam  5 mg Oral QHS  . feeding supplement  237 mL Oral TID BM  . fluconazole  100 mg Oral Daily  . FLUoxetine  40 mg Oral Daily  . fluticasone  2 spray Each Nare Daily  . gabapentin  600 mg Per Tube BID  . guaiFENesin  600 mg Oral BID  . heparin injection (subcutaneous)  5,000 Units Subcutaneous Q8H  . insulin aspart  0-20 Units Subcutaneous Q4H  . insulin aspart  6 Units Subcutaneous TID WC  . insulin detemir  30 Units Subcutaneous BID  . isosorbide dinitrate  10 mg Oral TID  . levothyroxine  175 mcg Oral QAC breakfast  . mouth rinse  15 mL Mouth Rinse q12n4p  . multivitamin with minerals  1 tablet Oral Daily  . pantoprazole  40 mg Oral Daily  . revefenacin  175 mcg Nebulization Daily  . senna-docusate  1 tablet Oral QHS  . sodium chloride flush  10-40 mL Intracatheter Q12H  . traZODone  100 mg Oral QHS   Continuous Infusions: . sodium chloride 10 mL/hr at 11/17/20  0600    Active Problems:   Acute respiratory failure (HCC)   Acute exacerbation of chronic obstructive pulmonary disease (COPD) (HCC)   Major depressive disorder   Hypothyroidism   OSA (obstructive sleep apnea)   HTN (hypertension)   HLD (hyperlipidemia)   CHF (congestive heart failure) (HCC)   Left cervical radiculopathy   Acute cervical radiculopathy   Pressure injury of skin   HCAP (healthcare-associated pneumonia)   Mucus plugging of bronchi   Esophageal dysphagia   Elevated BUN   AKI (acute kidney injury) ([place])   LOS: 31 days    A &  P   Acute on chronic hypoxemic/hypercarbic respiratory failure, multifactorial etiology, improving.Marland Kitchen  COPD exacerbation resolved.  Completed antibiotic course for Proteus vulgaris pneumonia.  Still high aspiration risk given dysphagia and hospital-acquired delirium further complicated by mucous plugging and atelectasis. Currently the patient is saturating in the 90's on room air. He will continue to receive supportive care with flutter valve, incentive spirometry, scheduled inhalers. He is also to receive chest physiotherapy, suctioning, continue O2 goal SPO2 greater than 88. He is currently saturating97% on 1L O2 by nasal cannula.  Nonoliguric AKI. Resolved with IV fljuids.  Suspect prerenal in the setting of some recent low BP (nadir of 87/52 on 1/1) with hardly any p.o. intake though patient is receiving tube feeds complicated by IV lasix use for his CHF as well as home losartan  Baseline creatinine 0.8-1.1, currently 0.79 with a BUN of 70.  Normal bladder with no hydronephrosis on renal ultrasound suggesting medical renal disease based off echogenicity.  UA unremarkableContinue to monitor creatinine, electrolytes, and volume status. Avoid nephrotoxins and hypotension.  Dysphagia: Concern for achalasia vs esophageal stricture. 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  Status post modified  barium swallow evaluation by speech. The patient is full assist for feeding, nectar thick liquids twice a day. The patient has a known history of stricture s/p unsuccessful dilatation. Pt continues to have issues with dysphagia and is dependent upon tube feeds post operatively. Although it is likely that the patient's recent cervical procedures will preclude EGD at this point, I have consulted GI to evaluate the patient. At their request DG esophagram was performed. I did not demonstrate stricture, but did demonstrate dysmotiity. GI assistance is appreciated. SLP re-evaluated the patient and found that he continues to be at considerable risk of aspiration. However, the patient told her that he wanted to be able to eat even if he may aspirate and die. I discussed this with the patient and explained that his status as a full code complicates this and that he would need to be a DNI or DNR if he wished to eat. I consulted palliative care to discuss this with them. Dr. Hilma Favors discussed this with the patient today. He has decided to be a partial code with the only intervention in the case of loss of spontaneous respirations or cardiac activity would be administration of medications for CPR. I will start a diet in accordance with SLP's recommendations.  Encephalopathy: Improving daily. Alert and oriented to self and place this morning, but a little confused. Continue delirium precautions including frequent orientation, minimizing sedating medications. Continue scheduled Seroquel and trazodone, Prozac.  Hypoglycemia: Pt is no longer receiving tube feeds. He is now on a soft diet as well as tid nutritional supplements. Will check 0300 glucose as well.  Abrasion/Erosion to Bridge of Nose: Due to BIPAP. Plastic surgery evaluation recommended by wound care in their note. They will be called first thing in the am.  Hypothyroidism: Continue Synthroid.  Chronic diastolic CHF: Noted. Compensated. The patient appears  euvolemic. Monitor volume status. Hold diuretics for now.   HTN: Currently normotensive.  Did have nadir of 80s over 50s on 1/1. Home losartan held given AKI. Verapamil not ordered on admission. Monitor.  HLD: Continue statin as at home.  Type 2 diabetes:  A1c 6.9.  CBGs currently at goal. The patient is receivingLevemir 30 units sq daily with SSI per FSBS on Q4 hr schedule as he is currently receiving tube feeds via cortrack.  OSA: Continue  Nightly BiPAP  Leukocytosis:  stable.  Currently 17.8, remains afebrile. Monitor CBC.  Chronic normocytic anemia, stable.  Hemoglobin stable at baseline. Monitor CBC.   Status post ACDF status post laminectomy: Postoperative management per primary (NSG).  I have seen and examined this patient myself. I have spent 34 minutes in his evaluation and care.  DVT Prophylaxis: SCD's CODE STATUS: Partial Code Family Communication: None available Disposition: Status is: Inpatient  Remains inpatient appropriate because:Ongoing diagnostic testing needed not appropriate for outpatient work up  Dispo: The patient is from: Home              Anticipated d/c is to: SNF              Anticipated d/c date is: 2 days              Patient currently is not medically stable to d/c.   [name] Hengel, DO [facility] Direct contact: see [email or web address]  7PM-7AM contact night coverage as above 11/26/2020, 3:42 PM  LOS: 24 days

## 2020-11-26 NOTE — Progress Notes (Signed)
Physical Therapy Treatment Patient Details Name: [name] [name] MRN: [id number] DOB: 01/25/1946 Today's Date: 11/26/2020    History of Present Illness Patient is a 75 y/o male who presents s/p C3-4, 4-5 ACDF 12/10 with post op LUE weakness requiring C3-6 posterior decompressive lami 14/43 complicated by post op hypoxemia. Reintubated on 12/23-12/29. PMH includes COPD, prostata CA, HTN, depression, CHF.    PT Comments    The pt was in bed upon arrival of PT, agreeable to session with focus on progressing OOB mobility and endurance. The pt was able to demo improvements in initial bed mobility, requiring less assist to complete movements at this time. However, the pt remains limited by deficits in strength, power, and activity tolerance, completing only x2 sit-stand transfers for <10 seconds of total standing time prior to requesting to sit due to fear of falling and onset of dizziness. Upon return to sitting, pt reports sx resolved, but BP recorded as 86/48 (60). The pt declined lateral scoot transfer to recliner at this time, but was able to tolerate sitting in chair egress position in bed with HOB at 64 deg and legs lowered with BP stable. The pt was educated on importance of continued mobility and upright positioning to reduce impact of orthostatic hypotension on therapy sessions. Will continue to benefit from max therapies to progress activity tolerance, strength, and capacity for transfers.   VITALS: - sitting EOB: 114/55 (67) - sitting EOB post stand: 86/48 (60) - supine in bed: 90/57 (66) - supine after 5 min: 118/63 (79) - sitting in bed HOB at 64 deg: 101/61 (72)    Follow Up Recommendations  Supervision/Assistance - 24 hour;SNF     Equipment Recommendations  None recommended by PT    Recommendations for Other Services       Precautions / Restrictions Precautions Precautions: Fall;Cervical Precaution Comments: extubated 12/29 Restrictions Weight Bearing Restrictions: No     Mobility  Bed Mobility Overal bed mobility: Needs Assistance Bed Mobility: Rolling;Sidelying to Sit;Sit to Sidelying Rolling: Min guard Sidelying to sit: Min assist;HOB elevated     Sit to sidelying: Mod assist;+2 for physical assistance General bed mobility comments: cues for sequencing, continued cues for BLE movement OOB, modA to BLE to return to bed. modA of 2 for trunk management returning to bed  Transfers Overall transfer level: Needs assistance Equipment used: 2 person hand held assist Transfers: Sit to/from Stand Sit to Stand: Mod assist;+2 physical assistance         General transfer comment: cues for hand placement, assist to power up to stand with use of recliner for UE support to come to stand and for pt comfort due to fear of falling. pt completed x2, then declined scooting to chair        Balance Overall balance assessment: Needs assistance Sitting-balance support: No upper extremity supported;Feet supported Sitting balance-Leahy Scale: Fair Sitting balance - Comments: pt able to sit with BUE in his lap rather than on bed for balance, does benefit from minG to minA at times to steady with dizziness   Standing balance support: During functional activity;Bilateral upper extremity supported Standing balance-Leahy Scale: Poor Standing balance comment: modA of 2 with BUE supported                            Cognition Arousal/Alertness: Awake/alert Behavior During Therapy: WFL for tasks assessed/performed;Anxious (fearful of falling) Overall Cognitive Status: Impaired/Different from baseline Area of Impairment: Awareness;Problem solving;Safety/judgement;Memory;Following commands  Current Attention Level: Selective Memory: Decreased recall of precautions;Decreased short-term memory Following Commands: Follows one step commands with increased time Safety/Judgement: Decreased awareness of safety;Decreased awareness of  deficits Awareness: Emergent Problem Solving: Slow processing;Decreased initiation;Requires verbal cues;Requires tactile cues;Difficulty sequencing General Comments: pt alert and oriented, benefits from cues for safety and max encouragement for mobility. fear of falling limiting mobility, states he understands importance of mobility, but not pushing to engage in mobility      Exercises      General Comments General comments (skin integrity, edema, etc.): BP 114/55 (67) sitting EOB, drop to 86/48 (60) following x2 sit-stand transfers and required return to supine for 5-8 min to recover before tolerating chair position at 63 deg HOB elevation      Pertinent Vitals/Pain Pain Assessment: Faces Faces Pain Scale: Hurts even more Pain Location: neck with sitting EOB Pain Descriptors / Indicators: Grimacing Pain Intervention(s): Monitored during session;Limited activity within patient's tolerance;Repositioned;Patient requesting pain meds-RN notified (pt asking for pain meds, RN notified, but was not time for pain medicine)     PT Goals (current goals can now be found in the care plan section) Acute Rehab PT Goals Patient Stated Goal: to eat and drink PT Goal Formulation: With patient Time For Goal Achievement: 12/10/20 Potential to Achieve Goals: Fair Progress towards PT goals: Progressing toward goals    Frequency    Min 3X/week      PT Plan Current plan remains appropriate       AM-PAC PT "6 Clicks" Mobility   Outcome Measure  Help needed turning from your back to your side while in a flat bed without using bedrails?: A Lot Help needed moving from lying on your back to sitting on the side of a flat bed without using bedrails?: A Lot Help needed moving to and from a bed to a chair (including a wheelchair)?: A Lot Help needed standing up from a chair using your arms (e.g., wheelchair or bedside chair)?: A Lot Help needed to walk in hospital room?: Total Help needed climbing  3-5 steps with a railing? : Total 6 Click Score: 10    End of Session Equipment Utilized During Treatment: Gait belt Activity Tolerance: Patient limited by fatigue;Other (comment) (orthostatic hypotension, fear of falling) Patient left: in bed;with call bell/phone within reach;with bed alarm set;with family/visitor present Nurse Communication: Mobility status;Need for lift equipment PT Visit Diagnosis: Other abnormalities of gait and mobility (R26.89);Muscle weakness (generalized) (M62.81);Pain Pain - Right/Left: Left Pain - part of body: Hand     Time: 1110-1144 PT Time Calculation (min) (ACUTE ONLY): 34 min  Charges:  $Therapeutic Activity: 23-37 mins                     Karma Ganja, PT, DPT   Acute Rehabilitation Department Pager #: [phone]   Otho Bellows 11/26/2020, 2:50 PM

## 2020-11-26 NOTE — Consult Note (Addendum)
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 Measurement: 2cm x 3cm Wound bed: 100% eschar across bridge of nose. Slowly decreasing in size; lifting at edges.  Trimmed loose eschar with scissors, pt tolerated without c/o pain or bleeding.  This reveals pink moist full thickness tissue loss to wound edges; eschar remains tightly in place 1X2cm after loose skin removed. Expect remainder of wound to evolve into full thickness tissue loss eventually.  Periwound:intact, moist Dressing procedure/placement/frequency: Topical treatment orders have been provided for bedside nurses to perform daily as follows to assist with removal of nonviable tissue: Apply collagenase (Santyl) ointment to the eschar daily and top with a saline moistened gauze 2x2. Securement is with a silicone backed strip foam bandage.   Recommend consultation with Plastic Surgery for endorsement of current POC or recommendations for an alternative course. Additionally they may wish to follow patient in the post acute setting.  If you agree, please order/arrange consultation. The above is communicated to primary team via [place].  [place] nursing team will reassess the location weekly to determine if a change in the plan of care is indicated at that time.  Julien Girt MSN, RN, [place], [place], [place]  _______________________________________________________ [place] This writer was informed on the need for plastic surgery consult upon reading this note after 3 pm. No secure chat was received regarding this issue. Plastic surgery will be contacted in the morning as they are not available after 3:00pm per amion.  Yesenia Fontenette, DO

## 2020-11-26 NOTE — Progress Notes (Signed)
Pt continues to have pressure ulcer on nose making cpap not an option at this time. RT will continue to monitor.

## 2020-11-27 LAB — GLUCOSE, CAPILLARY
Glucose-Capillary: 71 mg/dL (ref 70–99)
Glucose-Capillary: 100 mg/dL — ABNORMAL HIGH (ref 70–99)
Glucose-Capillary: 81 mg/dL (ref 70–99)
Glucose-Capillary: 94 mg/dL (ref 70–99)
Glucose-Capillary: 123 mg/dL — ABNORMAL HIGH (ref 70–99)
Glucose-Capillary: 127 mg/dL — ABNORMAL HIGH (ref 70–99)
Glucose-Capillary: 103 mg/dL — ABNORMAL HIGH (ref 70–99)

## 2020-11-27 LAB — BASIC METABOLIC PANEL
Anion gap: 6 (ref 5–15)
BUN: 14 mg/dL (ref 8–23)
CO2: 24 mmol/L (ref 22–32)
Calcium: 8.4 mg/dL — ABNORMAL LOW (ref 8.9–10.3)
Chloride: 107 mmol/L (ref 98–111)
Creatinine, Ser: 0.79 mg/dL (ref 0.61–1.24)
GFR, Estimated: >60 mL/min (ref >60)
Glucose, Bld: 103 mg/dL — ABNORMAL HIGH (ref 70–99)
Potassium: 3.9 mmol/L (ref 3.5–5.1)
Sodium: 137 mmol/L (ref 135–145)

## 2020-11-27 LAB — SARS CORONAVIRUS 2 (TAT 6-24 HRS): SARS Coronavirus 2: NEGATIVE

## 2020-11-27 NOTE — TOC Progression Note (Addendum)
Transition of Care [facility]) - Progression Note    Patient Details  Name: [name] [name] MRN: [id number] Date of Birth: November 02, 1946  Transition of Care [facility]) CM/SW [place], [place] Phone Number: 11/27/2020, 2:54 PM  Clinical Narrative:     CSW confirmed bed offer with [facility] can admit patient tomorrow  CSW called patient's sister,[name]-informed of bed offer and potential d/c tomorrow  CSW updated RN- requested covid test.   Thurmond Butts, MSW, LCSW Clinical Social Worker   Expected Discharge Plan: [place] Barriers to Discharge: Continued Medical Work up  Expected Discharge Plan and Services Expected Discharge Plan: [place]   Discharge Planning Services: CM Consult   Living arrangements for the past 2 months: Single Family Home                                       Social Determinants of Health (SDOH) Interventions    Readmission Risk Interventions No flowsheet data found.

## 2020-11-27 NOTE — Progress Notes (Signed)
Pt continues to have pressure sore on bridge of nose and unable to use cpap at this time.  RT will continue to monitor.

## 2020-11-27 NOTE — Progress Notes (Signed)
Patient ID: [name] [name], male   DOB: 1946/09/29, 75 y.o.   MRN: [id number] BP 123/75 (BP Location: Left Arm)   Pulse (!) 56   Temp 98.5 F (36.9 C) (Oral)   Resp 17   Ht 6\' 2"  (1.88 m)   Wt 114.9 kg   SpO2 98%   BMI 32.52 kg/m  Alert, oriented x 4 Moving all extremities Waiting on placement Working with pt, ot Wounds are clean, and dry

## 2020-11-27 NOTE — Progress Notes (Signed)
PROGRESS NOTE  [name] [name] IDP:[id number] DOB: [birth year] DOA: 10/26/2020 PCP: Idelle Crouch, MD  Brief History    [name] [name] is a 75 y.o. year old male with medical history significant for OSA on NIV at home, cervical stenosis admitted on 12/10 for ACDF.  Hospital course was complicated by postoperative left extremity weakness requiring posterior cervical laminectomy on 12/11, postoperative hypoxemia complicated by aspiration pneumonia (treated with Unasyn 12/12-12/17,, mucous plugging requiring BiPAP, systemic steroids worsening bronchospasms and frequent suctioning while awaiting improvement in cough strength with assistance from PCCM was transferred to progressive unit under care of neurosurgical services with [facility] hospitalist asked to consult for medical comorbidities on 11/18/2020.  Pt continues to have issues with dysphagia and is dependent upon tube feeds. Although it is likely that the patient's recent cervical procedures will preclude EGD at this point, I have consulted GI to evaluate the patient. At their request DG esophagram was ordered. It demonstrated no stricture, but somewhat disordered motility. I appreciate GI's assistance.  SLP re-evaluated the patient and found that he continues to be at considerable risk of aspiration. However, the patient told her that he wanted to be able to eat even if he may aspirate and die. I discussed this with the patient and explained that his status as a full code complicates this and that he would need to be a DNI or DNR if he wished to eat. I consulted palliative care to discuss this with them. Dr. Hilma Favors discussed this with the patient today. He has decided to be a partial code with the only intervention in the case of loss of spontaneous respirations or cardiac activity would be administration of medications for CPR. The patient has been started on a soft diet. He is tolerating it well.  The patient hs been evaluated by PT/OT. They have  recommended SNF vs home with 24/7 supervision and assistance.  Plastic surgery has been consulted to evaluate the erosion on the bridge of the patient's nose from BIPAP.  Procedures  . ACDF 12/10 . Posterior cervical laminectomy 12/11  Antibiotics   Anti-infectives (From admission, onward)   Start     Dose/Rate Route Frequency Ordered Stop   11/22/20 1000  fluconazole (DIFLUCAN) 40 MG/ML suspension 100 mg        100 mg Oral Daily 11/21/20 1305     11/21/20 1400  fluconazole (DIFLUCAN) 40 MG/ML suspension 200 mg        200 mg Oral  Once 11/21/20 1305 11/21/20 1614   11/13/20 1330  fluconazole (DIFLUCAN) IVPB 400 mg  Status:  Discontinued        400 mg 100 mL/hr over 120 Minutes Intravenous Every 24 hours 11/13/20 1230 11/14/20 1626   11/07/20 1700  ceFEPIme (MAXIPIME) 2 g in sodium chloride 0.9 % 100 mL IVPB        2 g 200 mL/hr over 30 Minutes Intravenous Every 8 hours 11/07/20 0935 11/13/20 1843   11/04/20 1000  cefTRIAXone (ROCEPHIN) 2 g in sodium chloride 0.9 % 100 mL IVPB  Status:  Discontinued        2 g 200 mL/hr over 30 Minutes Intravenous Every 24 hours 11/04/20 0903 11/07/20 0933   10/28/20 2100  Ampicillin-Sulbactam (UNASYN) 3 g in sodium chloride 0.9 % 100 mL IVPB  Status:  Discontinued        3 g 200 mL/hr over 30 Minutes Intravenous Every 6 hours 10/28/20 1917 11/02/20 1019   10/27/20 1707  ceFAZolin (ANCEF) 2-4 GM/100ML-%  IVPB       Note to Pharmacy: Henrine Screws   : cabinet override      10/27/20 1707 10/28/20 0514   10/27/20 1700  ceFAZolin (ANCEF) IVPB 2g/100 mL premix  Status:  Discontinued        2 g 200 mL/hr over 30 Minutes Intravenous  Once 10/27/20 1658 10/27/20 2224      Subjective  The patient is resting comfortably. No new complaints.  Objective   Vitals:  Vitals:   11/27/20 0818 11/27/20 1147  BP:  102/67  Pulse:  (!) 56  Resp:  17  Temp:  97.9 F (36.6 C)  SpO2: 95% 97%  Exam:  Constitutional:  . The patient is awake and alert. No  acute distress. Respiratory:  . No increased work of breathing. . No wheezes, rales, or rhonchi . No tactile fremitus Cardiovascular:  . Regular rate and rhythm . No murmurs, ectopy, or gallups. . No lateral PMI. No thrills. Abdomen:  . Abdomen is soft, non-tender, non-distended . No hernias, masses, or organomegaly . Normoactive bowel sounds.  Musculoskeletal:  . No cyanosis, clubbing, or edema Skin:  . No rashes, lesions, ulcers . palpation of skin: no induration or nodules Neurologic:  . CN 2-12 intact . Sensation all 4 extremities intact Psychiatric:  . Mental status o Mood, affect appropriate o Orientation to person, place, time  . judgment and insight appear intact  I have personally reviewed the following:   Today's Data  . Vitals, BMP, Glucoses  Micro Data  . Respiratory culture: No growth  Imaging  . CXR 11/15/2020 . Renal ultrasound  Scheduled Meds: . acetaminophen  1,000 mg Oral TID  . arformoterol  15 mcg Nebulization BID  . atorvastatin  40 mg Oral Daily  . chlorhexidine  15 mL Mouth Rinse BID  . Chlorhexidine Gluconate Cloth  6 each Topical Daily  . collagenase   Topical Daily  . diazepam  5 mg Oral QHS  . feeding supplement  237 mL Oral TID BM  . fluconazole  100 mg Oral Daily  . FLUoxetine  40 mg Oral Daily  . fluticasone  2 spray Each Nare Daily  . gabapentin  600 mg Per Tube BID  . guaiFENesin  600 mg Oral BID  . heparin injection (subcutaneous)  5,000 Units Subcutaneous Q8H  . insulin aspart  0-20 Units Subcutaneous Q4H  . isosorbide dinitrate  10 mg Oral TID  . levothyroxine  175 mcg Oral QAC breakfast  . mouth rinse  15 mL Mouth Rinse q12n4p  . multivitamin with minerals  1 tablet Oral Daily  . pantoprazole  40 mg Oral Daily  . revefenacin  175 mcg Nebulization Daily  . senna-docusate  1 tablet Oral QHS  . sodium chloride flush  10-40 mL Intracatheter Q12H  . traZODone  100 mg Oral QHS   Continuous Infusions: . sodium chloride 10  mL/hr at 11/17/20 0600    Active Problems:   Acute respiratory failure (HCC)   Acute exacerbation of chronic obstructive pulmonary disease (COPD) (HCC)   Major depressive disorder   Hypothyroidism   OSA (obstructive sleep apnea)   HTN (hypertension)   HLD (hyperlipidemia)   CHF (congestive heart failure) (HCC)   Left cervical radiculopathy   Acute cervical radiculopathy   Pressure injury of skin   HCAP (healthcare-associated pneumonia)   Mucus plugging of bronchi   Esophageal dysphagia   Elevated BUN   AKI (acute kidney injury) ([place])   LOS: 32 days  A & P   Acute on chronic hypoxemic/hypercarbic respiratory failure, multifactorial etiology, improving.Marland Kitchen  COPD exacerbation resolved.  Completed antibiotic course for Proteus vulgaris pneumonia.  Still high aspiration risk given dysphagia and hospital-acquired delirium further complicated by mucous plugging and atelectasis. Currently the patient is saturating in the 90's on room air. He will continue to receive supportive care with flutter valve, incentive spirometry, scheduled inhalers. He is also to receive chest physiotherapy, suctioning, continue O2 goal SPO2 greater than 88. He is currently saturating97% on 1L O2 by nasal cannula.  Nonoliguric AKI. Resolved with IV fljuids.  Suspect prerenal in the setting of some recent low BP (nadir of 87/52 on 1/1) with hardly any p.o. intake though patient is receiving tube feeds complicated by IV lasix use for his CHF as well as home losartan  Baseline creatinine 0.8-1.1, currently 0.79 with a BUN of 70.  Normal bladder with no hydronephrosis on renal ultrasound suggesting medical renal disease based off echogenicity.  UA unremarkableContinue to monitor creatinine, electrolytes, and volume status. Avoid nephrotoxins and hypotension.  Dysphagia: Concern for achalasia vs esophageal stricture. 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  Status  post modified barium swallow evaluation by speech. The patient is full assist for feeding, nectar thick liquids twice a day. The patient has a known history of stricture s/p unsuccessful dilatation. Pt continues to have issues with dysphagia and is dependent upon tube feeds post operatively. Although it is likely that the patient's recent cervical procedures will preclude EGD at this point, I have consulted GI to evaluate the patient. At their request DG esophagram was performed. I did not demonstrate stricture, but did demonstrate dysmotiity. GI assistance is appreciated. SLP re-evaluated the patient and found that he continues to be at considerable risk of aspiration. However, the patient told her that he wanted to be able to eat even if he may aspirate and die. I discussed this with the patient and explained that his status as a full code complicates this and that he would need to be a DNI or DNR if he wished to eat. I consulted palliative care to discuss this with them. Dr. Hilma Favors discussed this with the patient today. He has decided to be a partial code with the only intervention in the case of loss of spontaneous respirations or cardiac activity would be administration of medications for CPR. I will start a diet in accordance with SLP's recommendations.  Encephalopathy: Improving daily. Alert and oriented to self and place this morning, but a little confused. Continue delirium precautions including frequent orientation, minimizing sedating medications. Continue scheduled Seroquel and trazodone, Prozac.  Hypoglycemia: Pt is no longer receiving tube feeds. He is now on a soft diet as well as tid nutritional supplements. Will check 0300 glucose as well.  Abrasion/Erosion to Bridge of Nose: Due to BIPAP. Plastic surgery evaluation recommended by wound care in their note. Plastic surgery has been consulted.   Hypothyroidism: Continue Synthroid.  Chronic diastolic CHF: Noted. Compensated. The patient  appears euvolemic. Monitor volume status. Hold diuretics for now.   HTN: Currently normotensive.  Did have nadir of 80s over 50s on 1/1. Home losartan held given AKI. Verapamil not ordered on admission. Monitor.  HLD: Continue statin as at home.  Type 2 diabetes:  A1c 6.9.  CBGs currently at goal. The patient is receivingLevemir 30 units sq daily with SSI per FSBS on Q4 hr schedule as he is currently receiving tube feeds via cortrack.  OSA: Continue Nightly  BiPAP  Leukocytosis:  stable.  Currently 17.8, remains afebrile. Monitor CBC.  Chronic normocytic anemia, stable.  Hemoglobin stable at baseline. Monitor CBC.   Status post ACDF status post laminectomy: Postoperative management per primary (NSG).  I have seen and examined this patient myself. I have spent 32 minutes in his evaluation and care.  DVT Prophylaxis: SCD's CODE STATUS: Partial Code Family Communication: None available Disposition: Status is: Inpatient  Remains inpatient appropriate because:Ongoing diagnostic testing needed not appropriate for outpatient work up  Dispo: The patient is from: Home              Anticipated d/c is to: SNF              Anticipated d/c date is: 2 days              Patient currently is not medically stable to d/c.   Keiffer Piper, DO [facility] Direct contact: see [email or web address]  7PM-7AM contact night coverage as above 11/27/2020, 3:37 PM  LOS: 24 days

## 2020-11-28 ENCOUNTER — Encounter (HOSPITAL_COMMUNITY): Payer: Self-pay | Admitting: Neurosurgery

## 2020-11-28 DIAGNOSIS — L8990 Pressure ulcer of unspecified site, unspecified stage: Secondary | ICD-10-CM

## 2020-11-28 LAB — GLUCOSE, CAPILLARY
Glucose-Capillary: 91 mg/dL (ref 70–99)
Glucose-Capillary: 99 mg/dL (ref 70–99)
Glucose-Capillary: 139 mg/dL — ABNORMAL HIGH (ref 70–99)
Glucose-Capillary: 116 mg/dL — ABNORMAL HIGH (ref 70–99)
Glucose-Capillary: 133 mg/dL — ABNORMAL HIGH (ref 70–99)
Glucose-Capillary: 114 mg/dL — ABNORMAL HIGH (ref 70–99)

## 2020-11-28 MED ORDER — ATORVASTATIN CALCIUM 40 MG PO TABS: 40.0000 mg | ORAL_TABLET | Freq: Every day | ORAL | 0 refills | Status: DC

## 2020-11-28 MED ORDER — ISOSORBIDE DINITRATE 10 MG PO TABS: 10.0000 mg | ORAL_TABLET | Freq: Three times a day (TID) | ORAL | 0 refills | Status: DC

## 2020-11-28 MED ORDER — GABAPENTIN 300 MG PO CAPS
600.0000 mg | ORAL_CAPSULE | Freq: Two times a day (BID) | ORAL | Status: DC
Start: 2020-11-28 — End: 2020-11-29
  Administered 2020-11-28 – 2020-11-29 (×3): 600 mg via ORAL
  Filled 2020-11-28 (×3): qty 2

## 2020-11-28 NOTE — Progress Notes (Signed)
PROGRESS NOTE  [name] [name] IDP:[id number] DOB: [birth year] DOA: 10/26/2020 PCP: Idelle Crouch, MD  Brief History    [name] [name] is a 75 y.o. year old male with medical history significant for OSA on NIV at home, cervical stenosis admitted on 12/10 for ACDF.  Hospital course was complicated by postoperative left extremity weakness requiring posterior cervical laminectomy on 12/11, postoperative hypoxemia complicated by aspiration pneumonia (treated with Unasyn 12/12-12/17,, mucous plugging requiring BiPAP, systemic steroids worsening bronchospasms and frequent suctioning while awaiting improvement in cough strength with assistance from PCCM was transferred to progressive unit under care of neurosurgical services with [facility] hospitalist asked to consult for medical comorbidities on 11/18/2020.  Pt continues to have issues with dysphagia and is dependent upon tube feeds. Although it is likely that the patient's recent cervical procedures will preclude EGD at this point, I have consulted GI to evaluate the patient. At their request DG esophagram was ordered. It demonstrated no stricture, but somewhat disordered motility. I appreciate GI's assistance.  SLP re-evaluated the patient and found that he continues to be at considerable risk of aspiration. However, the patient told her that he wanted to be able to eat even if he may aspirate and die. I discussed this with the patient and explained that his status as a full code complicates this and that he would need to be a DNI or DNR if he wished to eat. I consulted palliative care to discuss this with them. Dr. Hilma Favors discussed this with the patient today. He has decided to be a partial code with the only intervention in the case of loss of spontaneous respirations or cardiac activity would be administration of medications for CPR. The patient has been started on a soft diet. He is tolerating it well.  The patient hs been evaluated by PT/OT. They have  recommended SNF vs home with 24/7 supervision and assistance.  Plastic surgery has been consulted to evaluate the erosion on the bridge of the patient's nose from BIPAP.  Procedures  . ACDF 12/10 . Posterior cervical laminectomy 12/11  Antibiotics   Anti-infectives (From admission, onward)   Start     Dose/Rate Route Frequency Ordered Stop   11/22/20 1000  fluconazole (DIFLUCAN) 40 MG/ML suspension 100 mg        100 mg Oral Daily 11/21/20 1305     11/21/20 1400  fluconazole (DIFLUCAN) 40 MG/ML suspension 200 mg        200 mg Oral  Once 11/21/20 1305 11/21/20 1614   11/13/20 1330  fluconazole (DIFLUCAN) IVPB 400 mg  Status:  Discontinued        400 mg 100 mL/hr over 120 Minutes Intravenous Every 24 hours 11/13/20 1230 11/14/20 1626   11/07/20 1700  ceFEPIme (MAXIPIME) 2 g in sodium chloride 0.9 % 100 mL IVPB        2 g 200 mL/hr over 30 Minutes Intravenous Every 8 hours 11/07/20 0935 11/13/20 1843   11/04/20 1000  cefTRIAXone (ROCEPHIN) 2 g in sodium chloride 0.9 % 100 mL IVPB  Status:  Discontinued        2 g 200 mL/hr over 30 Minutes Intravenous Every 24 hours 11/04/20 0903 11/07/20 0933   10/28/20 2100  Ampicillin-Sulbactam (UNASYN) 3 g in sodium chloride 0.9 % 100 mL IVPB  Status:  Discontinued        3 g 200 mL/hr over 30 Minutes Intravenous Every 6 hours 10/28/20 1917 11/02/20 1019   10/27/20 1707  ceFAZolin (ANCEF) 2-4 GM/100ML-%  IVPB       Note to Pharmacy: Henrine Screws   : cabinet override      10/27/20 1707 10/28/20 0514   10/27/20 1700  ceFAZolin (ANCEF) IVPB 2g/100 mL premix  Status:  Discontinued        2 g 200 mL/hr over 30 Minutes Intravenous  Once 10/27/20 1658 10/27/20 2224      Subjective  The patient is resting comfortably. No new complaints.  Objective   Vitals:  Vitals:   11/28/20 0903 11/28/20 0904  BP:    Pulse:    Resp:    Temp:    SpO2: 96% 96%  Exam:  . General exam: Appears calm and comfortable  . Respiratory system: Clear to  auscultation. Respiratory effort normal. . Cardiovascular system: S1 & S2 heard, RRR. No JVD, murmurs, rubs, gallops or clicks. No pedal edema. . Gastrointestinal system: Abdomen is nondistended, soft and nontender. No organomegaly or masses felt. Normal bowel sounds heard. . Central nervous system: Alert and oriented. No focal neurological deficits. . Extremities: Symmetric 5 x 5 power. . Skin: No rashes, lesions or ulcers.  Marland Kitchen Psychiatry: Judgement and insight appear normal. Mood & affect appropriate.  .   I have personally reviewed the following:   Today's Data  . Vitals, BMP, Glucoses  Micro Data  . Respiratory culture: No growth  Imaging  . CXR 11/15/2020 . Renal ultrasound  Scheduled Meds: . acetaminophen  1,000 mg Oral TID  . arformoterol  15 mcg Nebulization BID  . atorvastatin  40 mg Oral Daily  . chlorhexidine  15 mL Mouth Rinse BID  . Chlorhexidine Gluconate Cloth  6 each Topical Daily  . collagenase   Topical Daily  . diazepam  5 mg Oral QHS  . feeding supplement  237 mL Oral TID BM  . fluconazole  100 mg Oral Daily  . FLUoxetine  40 mg Oral Daily  . fluticasone  2 spray Each Nare Daily  . gabapentin  600 mg Oral BID  . guaiFENesin  600 mg Oral BID  . heparin injection (subcutaneous)  5,000 Units Subcutaneous Q8H  . insulin aspart  0-20 Units Subcutaneous Q4H  . isosorbide dinitrate  10 mg Oral TID  . levothyroxine  175 mcg Oral QAC breakfast  . mouth rinse  15 mL Mouth Rinse q12n4p  . multivitamin with minerals  1 tablet Oral Daily  . pantoprazole  40 mg Oral Daily  . revefenacin  175 mcg Nebulization Daily  . senna-docusate  1 tablet Oral QHS  . sodium chloride flush  10-40 mL Intracatheter Q12H  . traZODone  100 mg Oral QHS   Continuous Infusions: . sodium chloride 10 mL/hr at 11/17/20 0600    Active Problems:   Acute respiratory failure (HCC)   Acute exacerbation of chronic obstructive pulmonary disease (COPD) (HCC)   Major depressive disorder    Hypothyroidism   OSA (obstructive sleep apnea)   HTN (hypertension)   HLD (hyperlipidemia)   CHF (congestive heart failure) (HCC)   Left cervical radiculopathy   Acute cervical radiculopathy   Pressure injury of skin   HCAP (healthcare-associated pneumonia)   Mucus plugging of bronchi   Esophageal dysphagia   Elevated BUN   AKI (acute kidney injury) ([place])   LOS: 33 days    A & P   Acute on chronic hypoxemic/hypercarbic respiratory failure, multifactorial etiology, improving.Marland Kitchen  COPD exacerbation resolved.  Completed antibiotic course for Proteus vulgaris pneumonia.  Still high aspiration risk given dysphagia and hospital-acquired  delirium further complicated by mucous plugging and atelectasis. Currently the patient is saturating in the 90's on room air. He will continue to receive supportive care with flutter valve, incentive spirometry, scheduled inhalers. He is also to receive chest physiotherapy, suctioning, continue O2 goal SPO2 greater than 88.  He is currently saturating over 92% on room air and does not have any dyspnea.  Nonoliguric AKI. Resolved with IV fljuids.   Dysphagia: Concern for achalasia vs esophageal stricture. 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  Status post modified barium swallow evaluation by speech.  The patient has a known history of stricture s/p unsuccessful dilatation.  Per GI request DG esophagram was performed. I did not demonstrate stricture, but did demonstrate dysmotiity. SLP re-evaluated the patient and found that he continues to be at considerable risk of aspiration.  He was seen by palliative care.  Became partial code.  Wanted to eat.  Now on soft diet.  Encephalopathy: Resolved.  Currently alert and oriented.  Hypoglycemia: Pt is no longer receiving tube feeds. He is now on a soft diet as well as tid nutritional supplements. Will check 0300 glucose as well.  Abrasion/Erosion to Bridge of  Nose: Due to BIPAP. Plastic surgery evaluation recommended by wound care in their note. Plastic surgery has been consulted.   Hypothyroidism: Continue Synthroid.  Chronic diastolic CHF: Noted. Compensated. The patient appears euvolemic. Monitor volume status. Hold diuretics for now.   HTN: Currently normotensive.    Please discharge on current medications.  HLD: Continue statin as at home.  Type 2 diabetes: Controlled.  Please discharge on current medications.  OSA: Continue Nightly BiPAP  Leukocytosis: Resolved.  Chronic normocytic anemia, stable.  Hemoglobin stable at baseline. Monitor CBC.   Status post ACDF status post laminectomy: Postoperative management per primary (NSG).  DVT Prophylaxis: SCD's CODE STATUS: Partial Code Family Communication: None available Disposition: Status is: Inpatient  Remains inpatient appropriate because:Ongoing diagnostic testing needed not appropriate for outpatient work up  Dispo: The patient is from: Home              Anticipated d/c is to: SNF              Anticipated d/c date is: Today              Patient currently is  medically stable to d/c.   Darliss Cheney, MD [facility] Direct contact: see [email or web address]  7PM-7AM contact night coverage as above

## 2020-11-28 NOTE — Consult Note (Signed)
Reason for Consult: nasal wound Referring Physician: Dr. Ashok Pall  [name] [name] is an 75 y.o. male.  HPI: The patient is a 75 year old [place] male who underwent an anterior cervical discectomy at C3 and 4 and at C5 and 6.  He had some complications and pain after which led to him going back to the OR on 1211 and underwent a C3-C6 posterior decompressive laminectomy. He was managed in the unit for some time and had CPAP.  It is believed that the CPAP created the nasal ulceration.  He does not complain of any pain today.  There does not appear to be any sign of infection.  It is approximately 2 cm in size and likely is full-thickness with a black eschar on top. He has positive medical history for cancer, CHF, hypertension and obstructive pulmonary disease.  With the rest of his medical history listed below.  He has had multiple surgeries including his spinal surgery. Past Medical History:  Diagnosis Date  . Anxiety disorder 05/05/2014   unspecifed  . CHF (congestive heart failure) ([place])   . Colonic polyp   . COPD (chronic obstructive pulmonary disease) ([place])   . Depression   . Dyspnea   . Elevated prostate specific antigen (PSA)   . Essential (primary) hypertension 05/05/2014  . GI bleed    due to diverticulitis   . Heart murmur    hx of 1960s  . History of thyroid cancer 05/05/2014  . Hypercholesteremia   . Hyperlipidemia   . Hypertension   . Hypothyroidism 05/05/2014  . Male erectile dysfunction 12/29/2012  . Malignant neoplasm of prostate ([place])    10/30/2011 T1c - Identified by needle biopsy . Gleason 7 (3+4) 5 of 12 cores, Bilateral   . Prediabetes 06/22/2017  . Sleep apnea    CPAP , report on chart , not used in last 2 weeks   . Urinary obstruction    not elsewhere classified    Past Surgical History:  Procedure Laterality Date  . BACK SURGERY     lumbar 1987   . BACK SURGERY     C-6- Plate&Pin  . CATARACT EXTRACTION W/PHACO Right 09/15/2018   Procedure: CATARACT  EXTRACTION PHACO AND INTRAOCULAR LENS PLACEMENT ([place]) RIGHT;  Surgeon: Leandrew Koyanagi, MD;  Location: [place];  Service: Ophthalmology;  Laterality: Right;  Requests to be last case  . CATARACT EXTRACTION W/PHACO Left 10/06/2018   Procedure: CATARACT EXTRACTION PHACO AND INTRAOCULAR LENS PLACEMENT ([place])  LEFT;  Surgeon: Leandrew Koyanagi, MD;  Location: [place];  Service: Ophthalmology;  Laterality: Left;  . CERVICAL DISC SURGERY    . COLONOSCOPY     10/06/2011, 06/05/2006, 05/06/2002 adematous polyps: CBF 09/2014; Recall Ltr mailed 02/27/2015 (dw)  . COLONOSCOPY WITH PROPOFOL N/A 07/06/2015   Procedure: COLONOSCOPY WITH PROPOFOL;  Surgeon: Manya Silvas, MD;  Location: [facility] ENDOSCOPY;  Service: Endoscopy;  Laterality: N/A;  . COLONOSCOPY WITH PROPOFOL N/A 09/01/2018   Procedure: COLONOSCOPY WITH PROPOFOL;  Surgeon: Manya Silvas, MD;  Location[facility] ENDOSCOPY;  Service: Endoscopy;  Laterality: N/A;  . COLONOSCOPY WITH PROPOFOL N/A 04/17/2020   Procedure: COLONOSCOPY WITH PROPOFOL;  Surgeon: Lucilla Lame, MD;  Location: [facility] ENDOSCOPY;  Service: Endoscopy;  Laterality: N/A;  . ESOPHAGOGASTRODUODENOSCOPY (EGD) WITH PROPOFOL N/A 04/17/2020   Procedure: ESOPHAGOGASTRODUODENOSCOPY (EGD) WITH PROPOFOL;  Surgeon: Lucilla Lame, MD;  Location: [facility] ENDOSCOPY;  Service: Endoscopy;  Laterality: N/A;  . EYE SURGERY    . HERNIA REPAIR     umbilical hernia repair   .  LAMINECTOMY FOR EXCISION / EVACUATION INTRASPINAL LESION     lumbar  . OTHER SURGICAL HISTORY     right rotator cuff surgery   . OTHER SURGICAL HISTORY     bilateral tubes in ears   . POSTERIOR CERVICAL LAMINECTOMY N/A 10/27/2020   Procedure: POSTERIOR CERVICAL LAMINECTOMY CERVICAL THREE-CERVICAL SIX;  Surgeon: Judith Part, MD;  Location: [place];  Service: Neurosurgery;  Laterality: N/A;  . POSTERIOR LAMINECTOMY / DECOMPRESSION CERVICAL SPINE    . PROSTATE BIOPSY     10/22/2011 Volume:55.8 cc's,  PSA:4.5, Free PSA:12%  . ROBOT ASSISTED LAPAROSCOPIC RADICAL PROSTATECTOMY  12/15/2011   Procedure: ROBOTIC ASSISTED LAPAROSCOPIC RADICAL PROSTATECTOMY LEVEL 2;  Surgeon: Dutch Gray, MD;  Location: [facility];  Service: Urology;  Laterality: N/A;      . ROTATOR CUFF REPAIR Right   . TONSILLECTOMY    . TOTAL THYROIDECTOMY      Family History  Problem Relation Age of Onset  . Hypertension Mother   . Anxiety disorder Mother   . Stroke Mother        "mini strokes"  . Coronary artery disease Mother   . Heart attack Mother   . Emphysema Father   . Heart disease Father   . Scoliosis Father   . Coronary artery disease Father   . GU problems Neg Hx   . Kidney disease Neg Hx   . Prostate cancer Neg Hx     Social History:  reports that he has been smoking. He has a 17.50 pack-year smoking history. He has never used smokeless tobacco. He reports previous alcohol use of about 2.0 standard drinks of alcohol per week. He reports that he does not use drugs.  Allergies:  Allergies  Allergen Reactions  . Ibuprofen     Other reaction(s): Angioedema    Medications: I have reviewed the patient's current medications.  Results for orders placed or performed during the hospital encounter of 10/26/20 (from the past 48 hour(s))  Glucose, capillary     Status: Abnormal   Collection Time: 11/26/20  3:11 PM  Result Value Ref Range   Glucose-Capillary 156 (H) 70 - 99 mg/dL    Comment: Glucose reference range applies only to samples taken after fasting for at least 8 hours.   Comment 1 Notify RN    Comment 2 Document in Chart   Glucose, capillary     Status: Abnormal   Collection Time: 11/26/20  9:03 PM  Result Value Ref Range   Glucose-Capillary 100 (H) 70 - 99 mg/dL    Comment: Glucose reference range applies only to samples taken after fasting for at least 8 hours.  Glucose, capillary     Status: None   Collection Time: 11/27/20 12:44 AM  Result Value Ref Range   Glucose-Capillary 71 70 - 99  mg/dL    Comment: Glucose reference range applies only to samples taken after fasting for at least 8 hours.  Glucose, capillary     Status: Abnormal   Collection Time: 11/27/20  4:36 AM  Result Value Ref Range   Glucose-Capillary 100 (H) 70 - 99 mg/dL    Comment: Glucose reference range applies only to samples taken after fasting for at least 8 hours.  Basic metabolic panel     Status: Abnormal   Collection Time: 11/27/20  5:00 AM  Result Value Ref Range   Sodium 137 135 - 145 mmol/L   Potassium 3.9 3.5 - 5.1 mmol/L   Chloride 107 98 - 111 mmol/L  CO2 24 22 - 32 mmol/L   Glucose, Bld 103 (H) 70 - 99 mg/dL    Comment: Glucose reference range applies only to samples taken after fasting for at least 8 hours.   BUN 14 8 - 23 mg/dL   Creatinine, Ser 0.79 0.61 - 1.24 mg/dL   Calcium 8.4 (L) 8.9 - 10.3 mg/dL   GFR, Estimated >60 >60 mL/min    Comment: (NOTE) Calculated using the CKD-EPI Creatinine Equation (2021)    Anion gap 6 5 - 15    Comment: Performed at [facility] [address], [place], [place] [address]  Glucose, capillary     Status: None   Collection Time: 11/27/20  7:38 AM  Result Value Ref Range   Glucose-Capillary 81 70 - 99 mg/dL    Comment: Glucose reference range applies only to samples taken after fasting for at least 8 hours.  Glucose, capillary     Status: None   Collection Time: 11/27/20 11:45 AM  Result Value Ref Range   Glucose-Capillary 94 70 - 99 mg/dL    Comment: Glucose reference range applies only to samples taken after fasting for at least 8 hours.  Glucose, capillary     Status: Abnormal   Collection Time: 11/27/20  4:53 PM  Result Value Ref Range   Glucose-Capillary 123 (H) 70 - 99 mg/dL    Comment: Glucose reference range applies only to samples taken after fasting for at least 8 hours.   Comment 1 Notify RN    Comment 2 Document in Chart   SARS CORONAVIRUS 2 (TAT 6-24 HRS) Nasopharyngeal Nasopharyngeal Swab     Status: None   Collection  Time: 11/27/20  5:33 PM   Specimen: Nasopharyngeal Swab  Result Value Ref Range   SARS Coronavirus 2 NEGATIVE NEGATIVE    Comment: (NOTE) SARS-CoV-2 target nucleic acids are NOT DETECTED.  The SARS-CoV-2 RNA is generally detectable in upper and lower respiratory specimens during the acute phase of infection. Negative results do not preclude SARS-CoV-2 infection, do not rule out co-infections with other pathogens, and should not be used as the sole basis for treatment or other patient management decisions. Negative results must be combined with clinical observations, patient history, and epidemiological information. The expected result is Negative.  Fact Sheet for Patients: SugarRoll.be  Fact Sheet for Healthcare Providers: https://www.woods-mathews.com/  This test is not yet approved or cleared by the [place] FDA and  has been authorized for detection and/or diagnosis of SARS-CoV-2 by FDA under an Emergency Use Authorization (EUA). This EUA will remain  in effect (meaning this test can be used) for the duration of the COVID-19 declaration under Se ction 564(b)(1) of the Act, 21 U.S.C. section 360bbb-3(b)(1), unless the authorization is terminated or revoked sooner.  Performed at [facility], [place] [address], [place], [place] [address]   Glucose, capillary     Status: Abnormal   Collection Time: 11/27/20  8:00 PM  Result Value Ref Range   Glucose-Capillary 127 (H) 70 - 99 mg/dL    Comment: Glucose reference range applies only to samples taken after fasting for at least 8 hours.  Glucose, capillary     Status: Abnormal   Collection Time: 11/27/20 11:10 PM  Result Value Ref Range   Glucose-Capillary 103 (H) 70 - 99 mg/dL    Comment: Glucose reference range applies only to samples taken after fasting for at least 8 hours.  Glucose, capillary     Status: None  Collection Time: 11/28/20  3:56 AM  Result Value Ref Range    Glucose-Capillary 91 70 - 99 mg/dL    Comment: Glucose reference range applies only to samples taken after fasting for at least 8 hours.  Glucose, capillary     Status: None   Collection Time: 11/28/20  7:58 AM  Result Value Ref Range   Glucose-Capillary 99 70 - 99 mg/dL    Comment: Glucose reference range applies only to samples taken after fasting for at least 8 hours.    No results found.  Review of Systems  Constitutional: Positive for activity change. Negative for appetite change.  Eyes: Negative.   Respiratory: Negative for chest tightness.   Cardiovascular: Negative.   Gastrointestinal: Negative.  Negative for abdominal distention.  Genitourinary: Negative.   Musculoskeletal: Positive for gait problem.  Hematological: Negative.   Psychiatric/Behavioral: Negative.    Blood pressure 133/78, pulse (!) 56, temperature 98.5 F (36.9 C), temperature source Oral, resp. rate 14, height 6\' 2"  (1.88 m), weight 114.9 kg, SpO2 96 %. Physical Exam Vitals and nursing note reviewed.  Constitutional:      Appearance: Normal appearance.  HENT:     Head: Normocephalic.     Nose:   Cardiovascular:     Rate and Rhythm: Normal rate.     Pulses: Normal pulses.  Pulmonary:     Effort: Pulmonary effort is normal.  Skin:    General: Skin is warm.     Capillary Refill: Capillary refill takes less than 2 seconds.  Neurological:     Mental Status: He is alert and oriented to person, place, and time.  Psychiatric:        Mood and Affect: Mood normal.        Thought Content: Thought content normal.     Assessment/Plan: Nasal ulceration -I discussed with the patient the options for debridement and trying to get it to heal faster.  In light of the fact that he is leaving for nursing home today and that he does not have any pain he would like to let it heal secondarily.  This is certainly an option.  I recommend Vaseline to the area 1 or 2 times daily.  I am happy to see him in follow-up.  If  it becomes infected then it will need to be debrided.  I am happy to see him once he is released from the nursing home. Pictures were obtained of the patient and placed in the chart with the patient's or guardian's permission.   Loel Lofty Sajjad Honea 11/28/2020, 12:10 PM

## 2020-11-28 NOTE — Discharge Instructions (Signed)

## 2020-11-28 NOTE — Progress Notes (Signed)
Occupational Therapy Treatment Patient Details Name: ROLLA KEDZIERSKI MRN: [id number] DOB: 12/09/1945 Today's Date: 11/28/2020    History of present illness Patient is a 75 y/o male who presents s/p C3-4, 4-5 ACDF 12/10 with post op LUE weakness requiring C3-6 posterior decompressive lami 52/84 complicated by post op hypoxemia. Reintubated on 12/23-12/29. PMH includes COPD, prostata CA, HTN, depression, CHF.   OT comments  Pt seen in conjunction wit PT to maximize pts activity tolerance however session limited by hypotension and reports of dizziness, see vitals below. Pt continues to present with pain, decreased activity tolerance, and generalized weakness impacting pts ability to complete BADLs independently. Pt required MIN A +2 to transition to EOB with pt needing MOD A +2 to laterally scoot to [place]. Deferred further mobility/ ADL participation d/t drop in BP. Pt would continue to benefit from skilled occupational therapy while admitted and after d/c to address the below listed limitations in order to improve overall functional mobility and facilitate independence with BADL participation. DC plan remains appropriate, will follow acutely per POC.   BP 133/78 at rest BP 109/66 1 min sitting 92/58 s/p 3 min sitting with onset of vomitting and lightheadedness 112/60 once returned to supine with HOB elevated at 30 deg   Follow Up Recommendations  CIR;Supervision/Assistance - 24 hour    Equipment Recommendations  3 in 1 bedside commode    Recommendations for Other Services      Precautions / Restrictions Precautions Precautions: Fall;Cervical Precaution Comments: pt re-educated on cervical precautions, pt with verbal understanding Restrictions Weight Bearing Restrictions: No       Mobility Bed Mobility Overal bed mobility: Needs Assistance Bed Mobility: Rolling;Sidelying to Sit;Sit to Sidelying Rolling: Min guard Sidelying to sit: Min assist;+2 for physical assistance     Sit to  sidelying: Min assist;+2 for physical assistance General bed mobility comments: with directional verbal cues pt able to reach across with L UE and use hand rail to roll to the R, minA for trunk elevation and to steady upon sitting initially, minA for LE management back into bed, upon sitting pt with orthostatic hypotension and onset of vomitting of water/salivia  Transfers Overall transfer level: Needs assistance Equipment used: 2 person hand held assist            Lateral/Scoot Transfers: Mod assist;+2 physical assistance General transfer comment: due to drop in BP and onset of lightheadedness and vomitting pt laterally scooted to [facility] with modAx2 to clear bottom, pt unable to tolerate standing    Balance Overall balance assessment: Needs assistance Sitting-balance support: No upper extremity supported;Feet supported Sitting balance-Leahy Scale: Fair Sitting balance - Comments: pt able to sit with close min guard without UE support. Pt tolerated sitting about 5 min prior to onset of lightheadedness and nausea/vomitting. Pt drank some water and then immeadiately started throwing it up, however no breakfast Postural control: Posterior lean Standing balance support: During functional activity Standing balance-Leahy Scale: Poor Standing balance comment: unable to achieve full standing                           ADL either performed or assessed with clinical judgement   ADL Overall ADL's : Needs assistance/impaired     Grooming: Set up;Sitting Grooming Details (indicate cue type and reason): to manage secretions                   Toilet Transfer Details (indicate cue type and reason): limited as pt  reports dizziness and hypotension, MOD A +2 for lateral scoot         Functional mobility during ADLs: Moderate assistance;+2 for physical assistance (lateral scoot to [facility]) General ADL Comments: pt limited by dizziness and hypotension this session     Vision        Perception     Praxis      Cognition Arousal/Alertness: Awake/alert Behavior During Therapy: WFL for tasks assessed/performed Overall Cognitive Status: Within Functional Limits for tasks assessed                                 General Comments: pt with good awareness of deficits, able to state date, place and situation, able to follow commands, able to state needs ie. I"m going to throw up, I get lightheaded with movement        Exercises General Exercises - Lower Extremity Long Arc Quad: AROM;Both;10 reps;Seated   Shoulder Instructions       General Comments BP 133/78 at rest, BP 109/66 1 min sitting, 92/58 s/p 3 min sitting with onset of vomitting and lightheadedness, 112/60 once returned to supine with HOB elevated at 30 deg    Pertinent Vitals/ Pain       Pain Assessment: Faces Faces Pain Scale: Hurts even more Pain Location: neck with sitting EOB Pain Descriptors / Indicators: Grimacing Pain Intervention(s): Monitored during session;Repositioned  Home Living                                          Prior Functioning/Environment              Frequency  Min 2X/week        Progress Toward Goals  OT Goals(current goals can now be found in the care plan section)  Progress towards OT goals: Progressing toward goals  Acute Rehab OT Goals Patient Stated Goal: leave today OT Goal Formulation: With patient Time For Goal Achievement: 11/29/20 Potential to Achieve Goals: [place] Discharge plan remains appropriate;Frequency remains appropriate    Co-evaluation      Reason for Co-Treatment: For patient/therapist safety PT goals addressed during session: Mobility/safety with mobility OT goals addressed during session: ADL's and self-care      AM-PAC OT "6 Clicks" Daily Activity     Outcome Measure   Help from another person eating meals?: A Little Help from another person taking care of personal grooming?: A Lot Help  from another person toileting, which includes using toliet, bedpan, or urinal?: A Lot Help from another person bathing (including washing, rinsing, drying)?: A Lot Help from another person to put on and taking off regular upper body clothing?: A Lot Help from another person to put on and taking off regular lower body clothing?: A Lot 6 Click Score: 13    End of Session    OT Visit Diagnosis: Unsteadiness on feet (R26.81);Other abnormalities of gait and mobility (R26.89);Muscle weakness (generalized) (M62.81);Pain Pain - Right/Left: Left   Activity Tolerance Other (comment) (limited by hypotension)   Patient Left with call bell/phone within reach;with bed alarm set;in bed;with nursing/sitter in room   Nurse Communication Mobility status        Time: 9937-1696 OT Time Calculation (min): 24 min  Charges: OT General Charges $OT Visit: 1 Visit OT Treatments $Therapeutic Activity: 8-22 mins  Corinne Ports K.,  COTA/L Acute Rehabilitation Services [place] 11/28/2020, 11:48 AM

## 2020-11-28 NOTE — Progress Notes (Signed)
Pt unable to wear CPAP at this time d/t large pressure ulcer on nose. RT will continue to monitor.

## 2020-11-28 NOTE — Progress Notes (Signed)
Physical Therapy Treatment Patient Details Name: [name] [name] MRN: [id number] DOB: [birth year] Today's Date: 11/28/2020    History of Present Illness Patient is a 75 y/o male who presents s/p C3-4, 4-5 ACDF 12/10 with post op LUE weakness requiring C3-6 posterior decompressive lami 40/98 complicated by post op hypoxemia. Reintubated on 12/23-12/29. PMH includes COPD, prostata CA, HTN, depression, CHF.    PT Comments    Pt much improved from cognitive stand point however OOB Mobility continues to be limited by orthostatic hypotension, onset of vomitting, and lightheadedness. See BPs below. RN present and aware. Pt unable to tolerate sitting EOB >6 min. Acute PT to cont to follow.   Supine BP (HOB at 30 deg): 133/78 S/p 1 min sitting: 109/66 S/p 3 min sitting: 92/58 Return to supine with HOB at 30 deg: 112/60    Follow Up Recommendations  SNF;Supervision/Assistance - 24 hour     Equipment Recommendations   (TBD at next venue)    Recommendations for Other Services       Precautions / Restrictions Precautions Precautions: Fall;Cervical Precaution Comments: pt re-educated on cervical precautions, pt with verbal understanding Restrictions Weight Bearing Restrictions: No    Mobility  Bed Mobility Overal bed mobility: Needs Assistance Bed Mobility: Rolling;Sidelying to Sit;Sit to Sidelying Rolling: Min guard Sidelying to sit: Min assist;+2 for physical assistance     Sit to sidelying: Min assist;+2 for physical assistance General bed mobility comments: with directional verbal cues pt able to reach across with L UE and use hand rail to roll to the R, minA for trunk elevation and to steady upon sitting initially, minA for LE management back into bed, upon sitting pt with orthostatic hypotension and onset of vomitting of water/salivia  Transfers Overall transfer level: Needs assistance Equipment used: 2 person hand held assist            Lateral/Scoot Transfers: Mod  assist;+2 physical assistance General transfer comment: due to drop in BP and onset of lightheadedness and vomitting pt laterally scooted to [facility] with modAx2 to clear bottom, pt unable to tolerate standing  Ambulation/Gait             General Gait Details: unable due to orthostatic hypotension   Stairs             Wheelchair Mobility    Modified Rankin (Stroke Patients Only)       Balance Overall balance assessment: Needs assistance Sitting-balance support: No upper extremity supported;Feet supported Sitting balance-Leahy Scale: Fair Sitting balance - Comments: pt able to sit with close min guard without UE support. Pt tolerated sitting about 5 min prior to onset of lightheadedness and nausea/vomitting. Pt drank some water and then immeadiately started throwing it up, however no breakfast Postural control: Posterior lean Standing balance support: During functional activity Standing balance-Leahy Scale: Poor Standing balance comment: unable to achieve full standing                            Cognition Arousal/Alertness: Awake/alert Behavior During Therapy: WFL for tasks assessed/performed Overall Cognitive Status: Within Functional Limits for tasks assessed                                 General Comments: pt with good awareness of deficits, able to state date, place and situation, able to follow commands, able to state needs ie. I"m going to throw up, I get  lightheaded with movement      Exercises General Exercises - Lower Extremity Long Arc Quad: AROM;Both;10 reps;Seated    General Comments General comments (skin integrity, edema, etc.): BP 133/78 at rest, BP 109/66 1 min sitting, 92/58 s/p 3 min sitting with onset of vomitting and lightheadedness, 112/60 once returned to supine with HOB elevated at 30 deg      Pertinent Vitals/Pain Pain Assessment: Faces Faces Pain Scale: Hurts even more Pain Location: neck with sitting EOB Pain  Descriptors / Indicators: Grimacing Pain Intervention(s): Monitored during session    Home Living                      Prior Function            PT Goals (current goals can now be found in the care plan section) Acute Rehab PT Goals Patient Stated Goal: leave today Progress towards PT goals: Not progressing toward goals - comment    Frequency    Min 3X/week      PT Plan Current plan remains appropriate    Co-evaluation PT/OT/SLP Co-Evaluation/Treatment: Yes Reason for Co-Treatment: For patient/therapist safety PT goals addressed during session: Mobility/safety with mobility        AM-PAC PT "6 Clicks" Mobility   Outcome Measure  Help needed turning from your back to your side while in a flat bed without using bedrails?: A Little Help needed moving from lying on your back to sitting on the side of a flat bed without using bedrails?: A Lot Help needed moving to and from a bed to a chair (including a wheelchair)?: A Lot Help needed standing up from a chair using your arms (e.g., wheelchair or bedside chair)?: A Lot Help needed to walk in hospital room?: A Lot Help needed climbing 3-5 steps with a railing? : Total 6 Click Score: 12    End of Session Equipment Utilized During Treatment: Gait belt Activity Tolerance: Other (comment) (limited by lightheadedness and vomitting) Patient left: in bed;with call bell/phone within reach;with bed alarm set;with nursing/sitter in room Nurse Communication: Mobility status PT Visit Diagnosis: Other abnormalities of gait and mobility (R26.89);Muscle weakness (generalized) (M62.81);Pain Pain - Right/Left: Left Pain - part of body: Hand     Time: 5329-9242 PT Time Calculation (min) (ACUTE ONLY): 24 min  Charges:  $Therapeutic Activity: 8-22 mins                     Kittie Plater, PT, DPT Acute Rehabilitation Services Pager #: [phone] Office #: [phone]    Berline Lopes 11/28/2020, 11:33 AM

## 2020-11-28 NOTE — Discharge Summary (Addendum)
Physician Discharge Summary  Patient ID: [name] [name] MRN: [id number] DOB/AGE: May 05, 1946 75 y.o.  Admit date: 10/26/2020 Discharge date: 11/28/2020  Admission Diagnoses:Upper extremity weakness status post ACDF  Discharge Diagnoses:  Active Problems:   Acute respiratory failure (HCC)   Acute exacerbation of chronic obstructive pulmonary disease (COPD) (HCC)   Major depressive disorder   Hypothyroidism   OSA (obstructive sleep apnea)   HTN (hypertension)   HLD (hyperlipidemia)   CHF (congestive heart failure) (HCC)   Left cervical radiculopathy   Acute cervical radiculopathy   Pressure injury of skin   HCAP (healthcare-associated pneumonia)   Mucus plugging of bronchi   Esophageal dysphagia   Elevated BUN   AKI (acute kidney injury) [facility])   Discharged Condition: fair  Hospital Course: [name] [name] underwent an uncomplicated ACDF on 27/04/2375, due to fairly profound lethargy and unrelenting pain in the left upper extremity associated with weakness. An MRI revealed no hematoma anteriorly at the site of surgery, but increased buckling of the ligamentum flavum. Thus he was taken to the operating room on the 11th of December and had a posterior laminectomy C3-6. Post op he could not come off the ventilator and this became the primary problem for the remainder of his hospitalization. Due to hypoxemia, and hypercarbia he had a prolonged period of ventilator supported breathing. He also sustained pneumonia Proteus vulgaris treated with a full course of abx. He was extubated, and able to hold his own. He continues to deal with dysphagia, but has handled a diet to account for this. He remains at high risk of aspiration but has accepted the risks. His wounds are clean, dry, without signs of infection He does not need a cervical collar.    Treatments: IV hydration, antibiotics: cefipime, respiratory therapy: albuterol/atropine nebulizer, therapies: PT, OT, ST and SW and surgery: C3-6  laminectomy  Discharge Exam: Blood pressure 125/69, pulse (!) 57, temperature 97.6 F (36.4 C), temperature source Oral, resp. rate 14, height 6\' 2"  (1.88 m), weight 114.9 kg, SpO2 96 %. General appearance: alert, cooperative, appears stated age and mild distress Neurologic: Grossly normal, continued weakness in left upper extremity  Disposition: Discharge disposition: 03-Skilled [place]  Allergies as of 11/28/2020      Reactions   Ibuprofen    Other reaction(s): Angioedema      Medication List    STOP taking these medications   oxyCODONE 5 MG immediate release tablet Commonly known as: Oxy IR/ROXICODONE   simvastatin 80 MG tablet Commonly known as: ZOCOR Replaced by: atorvastatin 40 MG tablet   tiZANidine 4 MG tablet Commonly known as: ZANAFLEX     TAKE these medications   atorvastatin 40 MG tablet Commonly known as: LIPITOR Take 1 tablet (40 mg total) by mouth daily. Start taking on: November 29, 2020 Replaces: simvastatin 80 MG tablet   budesonide-formoterol 160-4.5 MCG/ACT inhaler Commonly known as: SYMBICORT Inhale 2 puffs into the lungs 2 (two) times daily.   Cholecalciferol 25 MCG (1000 UT) tablet Take 1,000 Units by mouth daily.   diazepam 5 MG tablet Commonly known as: VALIUM Take 5 mg by mouth every 6 (six) hours as needed for muscle spasms.   diphenhydrAMINE 25 MG tablet Commonly known as: BENADRYL Take 25 mg by mouth every 6 (six) hours as needed for allergies.   docusate sodium 100 MG capsule Commonly known as: COLACE Take 100 mg by mouth 2 (two) times daily.   FLUoxetine 40 MG capsule  Commonly known as: PROZAC Take 40 mg by mouth daily.   furosemide 40 MG tablet Commonly known as: LASIX Take 40 mg by mouth 2 (two) times daily.   gabapentin 100 MG capsule Commonly known as: NEURONTIN Take 100 mg by mouth 3 (three) times daily.   HYDROcodone-acetaminophen 5-325 MG tablet Commonly known  as: NORCO/VICODIN Take 1 tablet by mouth 3 (three) times daily as needed for moderate pain.   isosorbide dinitrate 10 MG tablet Commonly known as: ISORDIL Take 1 tablet (10 mg total) by mouth 3 (three) times daily.   isosorbide mononitrate 60 MG 24 hr tablet Commonly known as: IMDUR Take 60 mg by mouth daily.   levothyroxine 175 MCG tablet Commonly known as: SYNTHROID Take 175 mcg by mouth daily before breakfast.   losartan 50 MG tablet Commonly known as: COZAAR Take 50 mg by mouth daily.   metFORMIN 500 MG tablet Commonly known as: GLUCOPHAGE Take 500 mg by mouth daily.   methocarbamol 500 MG tablet Commonly known as: ROBAXIN   omeprazole 20 MG capsule Commonly known as: PRILOSEC Take 20 mg by mouth daily.   potassium chloride SA 20 MEQ tablet Commonly known as: KLOR-CON Take 20 mEq by mouth 3 (three) times daily.   tiotropium 18 MCG inhalation capsule Commonly known as: SPIRIVA Place 18 mcg into inhaler and inhale daily.   traZODone 100 MG tablet Commonly known as: DESYREL Take 100 mg by mouth at bedtime.   Ventolin HFA 108 (90 Base) MCG/ACT inhaler Generic drug: albuterol Inhale 2 puffs into the lungs every 6 (six) hours as needed for wheezing or shortness of breath.   verapamil 180 MG CR tablet Commonly known as: CALAN-SR Take 180 mg by mouth daily as needed (blood pressure of 150/90 or higher).       Follow-up Information    Ashok Pall, MD Follow up in 3 week(s).   Specialty: Neurosurgery Why: three weeks after discharge from the Skilled nursing facility Contact information: [address] [place] [address] [phone]              mild drainage from posterior cervical incision. Covered with dressing. No signs of infection. Exam is otherwise unchanged. He is confused at times, which is his current baseline.   SignedAshok Pall 11/28/2020, 2:59 PM

## 2020-11-28 NOTE — Progress Notes (Signed)
Patient working with PT. When sat on the side of the bed patients BP dropped form 109/66 to 92/58. Patient voiced he needed to vomit. Patient vomited up clear water like emesis. Patient was placed back in bed. BP retaken while lying in bed 132/72. Patient voiced he is no longer nauseated and does not feel light headed. Also want to make note suction canister was changed and content look to be emesis. Will continue to monitor.

## 2020-11-28 NOTE — TOC Progression Note (Signed)
Transition of Care [facility]) - Progression Note    Patient Details  Name: [name] [name] MRN: [id number] Date of Birth: 1946-03-20  Transition of Care [facility]) CM/SW [place], [place] Phone Number: 11/28/2020, 3:29 PM  Clinical Narrative:     [facility]- informed CSW too late to admit patient today ([place] to get mediations form their pharmacy,etc) - can admit tomorrow morning.  CSW updated RN   Thurmond Butts, MSW, LCSW Clinical Social Worker   Expected Discharge Plan: Skilled Nursing Facility Barriers to Discharge: Continued Medical Work up  Expected Discharge Plan and Services Expected Discharge Plan: [place]   Discharge Planning Services: CM Consult   Living arrangements for the past 2 months: Single Family Home Expected Discharge Date: 11/28/20                                     Social Determinants of Health (SDOH) Interventions    Readmission Risk Interventions No flowsheet data found.

## 2020-11-29 LAB — GLUCOSE, CAPILLARY
Glucose-Capillary: 83 mg/dL (ref 70–99)
Glucose-Capillary: 93 mg/dL (ref 70–99)
Glucose-Capillary: 108 mg/dL — ABNORMAL HIGH (ref 70–99)

## 2020-11-29 NOTE — Progress Notes (Signed)
Chart reviewed.  Patient was supposed to be discharged to nursing home yesterday however due to delay/some logistical issues, he ended up staying overnight.  He is being discharged today.  No concerns raised.

## 2020-11-29 NOTE — TOC Transition Note (Signed)
Transition of Care [facility]) - CM/SW Discharge Note   Patient Details  Name: BRACY PEPPER MRN: [id number] Date of Birth: 1946/09/18  Transition of Care [facility]) CM/SW Contact:  Vinie Sill, [employer] Phone Number: 11/29/2020, 12:01 PM   Clinical Narrative:     Patient will DC to: [facility] Date: 11/29/2020 Family Notified: nancy,sister Transport By: [place] placed in chart   Please call sister, [name] when the patient is leaving the unit [phone]  Per MD patient is ready for discharge. RN, patient, and facility notified of DC. Discharge Summary sent to facility. RN given number for [phone]. Ambulance transport requested for patient.   Clinical Social Worker signing off. Thurmond Butts, MSW, LCSW Clinical Social Worker   Final next level of care: Skilled Nursing Facility Barriers to Discharge: Barriers Resolved   Patient Goals and CMS Choice        Discharge Placement              Patient chooses bed at: [facility] Patient to be transferred to facility by: [facility] Name of family member notified: [name],sister Patient and family notified of of transfer: 11/29/20  Discharge Plan and Services   Discharge Planning Services: CM Consult                                 Social Determinants of Health (SDOH) Interventions     Readmission Risk Interventions No flowsheet data found.

## 2020-11-29 NOTE — Progress Notes (Signed)
Report called to [facility] RN @ [address] Patients sister [name] [name] was notified of discharge. Patient had no belongings at bed side. All discharge information and Rx sent with transport.

## 2020-11-29 NOTE — Progress Notes (Signed)
Physical Therapy Treatment Patient Details Name: DEQUANE STRAHAN MRN: [id number] DOB: Sep 24, 1946 Today's Date: 11/29/2020    History of Present Illness Patient is a 75 y/o male who presents s/p C3-4, 4-5 ACDF 12/10 with post op LUE weakness requiring C3-6 posterior decompressive lami 40/98 complicated by post op hypoxemia. Reintubated on 12/23-12/29. PMH includes COPD, prostata CA, HTN, depression, CHF.    PT Comments    Pt slowly improving, but still anxious about ambulating.  Emphasis on assisting with peri care and preparing for transfer to SNF.  Sit to standing, standing tolerance, transfers, recovery assist after syncopal episode.   Follow Up Recommendations  SNF;Supervision/Assistance - 24 hour     Equipment Recommendations  None recommended by PT    Recommendations for Other Services       Precautions / Restrictions Precautions Precautions: Fall;Cervical    Mobility  Bed Mobility Overal bed mobility: Needs Assistance Bed Mobility: Sit to Sidelying;Rolling Rolling: Min assist       Sit to sidelying: Mod assist;+2 for physical assistance General bed mobility comments: pt was thinking slowly after syncopal episode in standing.  Transfers Overall transfer level: Needs assistance   Transfers: Sit to/from Stand Sit to Stand: Mod assist Stand pivot transfers: Mod assist;+2 physical assistance;Total assist       General transfer comment: cues for hand placement, both forward and boost assist. pt got syncopal and slumped mid transfer and needing total of 2 persons to complete the transfer.  Ambulation/Gait                 Stairs             Wheelchair Mobility    Modified Rankin (Stroke Patients Only)       Balance     Sitting balance-Leahy Scale: Fair       Standing balance-Leahy Scale: Poor                              Cognition Arousal/Alertness: Awake/alert Behavior During Therapy: WFL for tasks  assessed/performed Overall Cognitive Status: Within Functional Limits for tasks assessed                               Problem Solving: Slow processing        Exercises      General Comments General comments (skin integrity, edema, etc.): unable to get a reading on BP when pt post syncopal episode.      Pertinent Vitals/Pain Pain Assessment: Faces Faces Pain Scale: Hurts a little bit Pain Location: neck with sitting EOB Pain Descriptors / Indicators: Grimacing;Guarding Pain Intervention(s): Monitored during session    Home Living                      Prior Function            PT Goals (current goals can now be found in the care plan section) Acute Rehab PT Goals PT Goal Formulation: With patient Time For Goal Achievement: 12/10/20 Potential to Achieve Goals: Fair Progress towards PT goals: Progressing toward goals    Frequency    Min 3X/week      PT Plan Current plan remains appropriate    Co-evaluation              AM-PAC PT "6 Clicks" Mobility   Outcome Measure  Help needed turning from your back to your  side while in a flat bed without using bedrails?: A Little Help needed moving from lying on your back to sitting on the side of a flat bed without using bedrails?: A Lot Help needed moving to and from a bed to a chair (including a wheelchair)?: A Lot Help needed standing up from a chair using your arms (e.g., wheelchair or bedside chair)?: A Lot Help needed to walk in hospital room?: A Lot Help needed climbing 3-5 steps with a railing? : Total 6 Click Score: 12    End of Session   Activity Tolerance: Other (comment) (syncopal) Patient left: in bed;with call bell/phone within reach;with bed alarm set Nurse Communication: Mobility status PT Visit Diagnosis: Other abnormalities of gait and mobility (R26.89);Muscle weakness (generalized) (M62.81);Pain     Time: 1207-1217 PT Time Calculation (min) (ACUTE ONLY): 10  min  Charges:  $Therapeutic Activity: 8-22 mins                     11/29/2020  [name], PT Acute Rehabilitation Services [phone]  (pager) [phone]  (office)   Tessie Fass [place] 11/29/2020, 2:10 PM

## 2020-11-29 NOTE — Progress Notes (Signed)
Patient ID: [name] [name], male   DOB: 09/09/1946, 75 y.o.   MRN: [id number] Will be discharged today. Will update discharge summary. Some drainage from posterior cervical wound, covered with dressing.

## 2021-02-18 ENCOUNTER — Other Ambulatory Visit: Payer: Self-pay | Admitting: General Practice

## 2021-02-18 ENCOUNTER — Encounter: Payer: Self-pay | Admitting: Urology

## 2021-02-18 ENCOUNTER — Other Ambulatory Visit: Payer: Self-pay

## 2021-02-18 ENCOUNTER — Ambulatory Visit (INDEPENDENT_AMBULATORY_CARE_PROVIDER_SITE_OTHER): Payer: Medicare Other | Admitting: Urology

## 2021-02-18 VITALS — BP 86/84 | HR 62 | Ht 74.0 in

## 2021-02-18 DIAGNOSIS — R300 Dysuria: Secondary | ICD-10-CM | POA: Diagnosis not present

## 2021-02-18 NOTE — Progress Notes (Signed)
02/18/2021 11:05 AM   [name] [name] 04-08-1946 [id number]  Referring provider: Benay Spice, PA-C [place],  [place] [address]  Chief Complaint  Patient presents with  . Urinary Frequency    HPI: I was consulted to assess the patient's voiding dysfunction and burning.  He has a lot of medical issues and he said he has been nursing home for 3 months due to low blood pressure and immobility.  He has had a lot of spinal cord issues.  He has an urge to urinate and mild burning.  He describes a negative culture a month ago.  He cannot stand.  He voids into a pull-up system.  He said he has fainted 25 times in the last 2 years and he should not stand unless he is to providers with him.  He question whether or not he 7 Foley catheter.  He describes prostate surgery 6 years ago in [place] for cancer with no radiation treatment.  I could not connect with [facility] today on my computer.  No history of bladder infections.   PMH: Past Medical History:  Diagnosis Date  . Anxiety disorder 05/05/2014   unspecifed  . CHF (congestive heart failure) ([place])   . Colonic polyp   . COPD (chronic obstructive pulmonary disease) ([place])   . Depression   . Dyspnea   . Elevated prostate specific antigen (PSA)   . Essential (primary) hypertension 05/05/2014  . GI bleed    due to diverticulitis   . Heart murmur    hx of 1960s  . History of thyroid cancer 05/05/2014  . Hypercholesteremia   . Hyperlipidemia   . Hypertension   . Hypothyroidism 05/05/2014  . Male erectile dysfunction 12/29/2012  . Malignant neoplasm of prostate ([place])    10/30/2011 T1c - Identified by needle biopsy . Gleason 7 (3+4) 5 of 12 cores, Bilateral   . Prediabetes 06/22/2017  . Sleep apnea    CPAP , report on chart , not used in last 2 weeks   . Urinary obstruction    not elsewhere classified    Surgical History: Past Surgical History:  Procedure Laterality Date  . BACK SURGERY     lumbar 1987    . BACK SURGERY     C-6- Plate&Pin  . CATARACT EXTRACTION W/PHACO Right 09/15/2018   Procedure: CATARACT EXTRACTION PHACO AND INTRAOCULAR LENS PLACEMENT ([place]) RIGHT;  Surgeon: Leandrew Koyanagi, MD;  Location: [facility];  Service: Ophthalmology;  Laterality: Right;  Requests to be last case  . CATARACT EXTRACTION W/PHACO Left 10/06/2018   Procedure: CATARACT EXTRACTION PHACO AND INTRAOCULAR LENS PLACEMENT ([place])  LEFT;  Surgeon: Leandrew Koyanagi, MD;  Location: [facility];  Service: Ophthalmology;  Laterality: Left;  . CERVICAL DISC SURGERY    . COLONOSCOPY     10/06/2011, 06/05/2006, 05/06/2002 adematous polyps: CBF 09/2014; Recall Ltr mailed 02/27/2015 (dw)  . COLONOSCOPY WITH PROPOFOL N/A 07/06/2015   Procedure: COLONOSCOPY WITH PROPOFOL;  Surgeon: Manya Silvas, MD;  Location: [facility] ENDOSCOPY;  Service: Endoscopy;  Laterality: N/A;  . COLONOSCOPY WITH PROPOFOL N/A 09/01/2018   Procedure: COLONOSCOPY WITH PROPOFOL;  Surgeon: Manya Silvas, MD;  Location: [facility] ENDOSCOPY;  Service: Endoscopy;  Laterality: N/A;  . COLONOSCOPY WITH PROPOFOL N/A 04/17/2020   Procedure: COLONOSCOPY WITH PROPOFOL;  Surgeon: Lucilla Lame, MD;  Location: [facility] ENDOSCOPY;  Service: Endoscopy;  Laterality: N/A;  . ESOPHAGOGASTRODUODENOSCOPY (EGD) WITH PROPOFOL N/A 04/17/2020   Procedure: ESOPHAGOGASTRODUODENOSCOPY (EGD) WITH PROPOFOL;  Surgeon: Lucilla Lame,  MD;  Location: [facility] ENDOSCOPY;  Service: Endoscopy;  Laterality: N/A;  . EYE SURGERY    . HERNIA REPAIR     umbilical hernia repair   . LAMINECTOMY FOR EXCISION / EVACUATION INTRASPINAL LESION     lumbar  . OTHER SURGICAL HISTORY     right rotator cuff surgery   . OTHER SURGICAL HISTORY     bilateral tubes in ears   . POSTERIOR CERVICAL LAMINECTOMY N/A 10/27/2020   Procedure: POSTERIOR CERVICAL LAMINECTOMY CERVICAL THREE-CERVICAL SIX;  Surgeon: Judith Part, MD;  Location: [facility];  Service: Neurosurgery;  Laterality: N/A;  .  POSTERIOR LAMINECTOMY / DECOMPRESSION CERVICAL SPINE    . PROSTATE BIOPSY     10/22/2011 Volume:55.8 cc's, PSA:4.5, Free PSA:12%  . ROBOT ASSISTED LAPAROSCOPIC RADICAL PROSTATECTOMY  12/15/2011   Procedure: ROBOTIC ASSISTED LAPAROSCOPIC RADICAL PROSTATECTOMY LEVEL 2;  Surgeon: Dutch Gray, MD;  Location: [facility];  Service: Urology;  Laterality: N/A;      . ROTATOR CUFF REPAIR Right   . TONSILLECTOMY    . TOTAL THYROIDECTOMY      Home Medications:  Allergies as of 02/18/2021      Reactions   Ibuprofen    Other reaction(s): Angioedema      Medication List       Accurate as of February 18, 2021 11:05 AM. If you have any questions, ask your nurse or doctor.        albuterol 108 (90 Base) MCG/ACT inhaler Commonly known as: VENTOLIN HFA Inhale 2 puffs into the lungs every 6 (six) hours as needed for wheezing or shortness of breath.   aspirin 81 MG EC tablet Take 1 tablet by mouth daily.   atorvastatin 40 MG tablet Commonly known as: LIPITOR Take 1 tablet (40 mg total) by mouth daily.   budesonide-formoterol 160-4.5 MCG/ACT inhaler Commonly known as: SYMBICORT Inhale 2 puffs into the lungs 2 (two) times daily.   Cholecalciferol 25 MCG (1000 UT) tablet Take 1,000 Units by mouth daily.   diazepam 5 MG tablet Commonly known as: VALIUM Take 5 mg by mouth every 6 (six) hours as needed for muscle spasms.   diphenhydrAMINE 25 MG tablet Commonly known as: BENADRYL Take 25 mg by mouth every 6 (six) hours as needed for allergies.   docusate sodium 100 MG capsule Commonly known as: COLACE Take 100 mg by mouth 2 (two) times daily.   donepezil 10 MG tablet Commonly known as: ARICEPT   FLUoxetine 40 MG capsule Commonly known as: PROZAC Take 40 mg by mouth daily.   furosemide 40 MG tablet Commonly known as: LASIX Take 40 mg by mouth 2 (two) times daily.   gabapentin 100 MG capsule Commonly known as: NEURONTIN Take 100 mg by mouth 3 (three) times daily.    HYDROcodone-acetaminophen 5-325 MG tablet Commonly known as: NORCO/VICODIN Take 1 tablet by mouth 3 (three) times daily as needed for moderate pain.   isosorbide dinitrate 10 MG tablet Commonly known as: ISORDIL Take 1 tablet (10 mg total) by mouth 3 (three) times daily.   isosorbide mononitrate 60 MG 24 hr tablet Commonly known as: IMDUR Take 60 mg by mouth daily.   levothyroxine 175 MCG tablet Commonly known as: SYNTHROID Take 175 mcg by mouth daily before breakfast.   losartan 50 MG tablet Commonly known as: COZAAR Take 50 mg by mouth daily.   magnesium oxide 400 MG tablet Commonly known as: MAG-OX   metFORMIN 500 MG tablet Commonly known as: GLUCOPHAGE Take 500 mg by mouth  daily.   methocarbamol 500 MG tablet Commonly known as: ROBAXIN   omeprazole 20 MG capsule Commonly known as: PRILOSEC Take 20 mg by mouth daily.   potassium chloride SA 20 MEQ tablet Commonly known as: KLOR-CON Take 20 mEq by mouth 3 (three) times daily.   prazosin 1 MG capsule Commonly known as: MINIPRESS Take by mouth.   tiotropium 18 MCG inhalation capsule Commonly known as: SPIRIVA Place 18 mcg into inhaler and inhale daily.   traZODone 100 MG tablet Commonly known as: DESYREL Take 100 mg by mouth at bedtime.   verapamil 180 MG CR tablet Commonly known as: CALAN-SR Take 180 mg by mouth daily as needed (blood pressure of 150/90 or higher).   Wixela Inhub 250-50 MCG/DOSE Aepb Generic drug: Fluticasone-Salmeterol       Allergies:  Allergies  Allergen Reactions  . Ibuprofen     Other reaction(s): Angioedema    Family History: Family History  Problem Relation Age of Onset  . Hypertension Mother   . Anxiety disorder Mother   . Stroke Mother        "mini strokes"  . Coronary artery disease Mother   . Heart attack Mother   . Emphysema Father   . Heart disease Father   . Scoliosis Father   . Coronary artery disease Father   . GU problems Neg Hx   . Kidney disease  Neg Hx   . Prostate cancer Neg Hx     Social History:  reports that he has been smoking. He has a 17.50 pack-year smoking history. He has never used smokeless tobacco. He reports previous alcohol use of about 2.0 standard drinks of alcohol per week. He reports that he does not use drugs.  ROS:                                        Physical Exam: BP (!) 86/84   Pulse 62   Ht 6\' 2"  (1.88 m)   BMI 32.52 kg/m   Constitutional:  Alert and oriented, No acute distress. HEENT: [place] AT, moist mucus membranes.  Trachea midline, no masses. Cardiovascular: No clubbing, cyanosis, or edema. Respiratory: Normal respiratory effort, no increased work of breathing. GI: Abdomen is soft, nontender, nondistended, no abdominal masses GU: No CVA tenderness.  Exam limited due to immobility Skin: No rashes, bruises or suspicious lesions. Lymph: No cervical or inguinal adenopathy. Neurologic: Grossly intact, no focal deficits, moving all 4 extremities. Psychiatric: Normal mood and affect.  Laboratory Data: Lab Results  Component Value Date   WBC 7.4 11/25/2020   HGB 10.0 (L) 11/25/2020   HCT 31.0 (L) 11/25/2020   MCV 96.0 11/25/2020   PLT 160 11/25/2020    Lab Results  Component Value Date   CREATININE 0.79 11/27/2020    No results found for: PSA  No results found for: TESTOSTERONE  Lab Results  Component Value Date   HGBA1C 6.9 (H) 10/26/2020    Urinalysis    Component Value Date/Time   COLORURINE YELLOW 11/18/2020 [place] 11/18/2020 0944   LABSPEC 1.019 11/18/2020 [place] 5.0 11/18/2020 [place] 11/18/2020 [place] 11/18/2020 [place] 11/18/2020 0944   KETONESUR NEGATIVE 11/18/2020 0944   PROTEINUR NEGATIVE 11/18/2020 0944   NITRITE NEGATIVE 11/18/2020 0944   LEUKOCYTESUR NEGATIVE 11/18/2020 0944    Pertinent Imaging: Chart  reviewed  Assessment & Plan: We will get the nursing  facility sent another culture.  Because of his immobility I do not think there is a good treatment for him.  His expectations and treatment goals discussed  We will get a urine culture sent to [place] from the nursing facility.  He will get a local referral for anal burning.  He understands that I do not have other good options for him due to his immobility and I do not recommend a Foley catheter.  He has been followed by Dr. Alinda Money in the past I do not think he needs cystoscopy today  There are no diagnoses linked to this encounter.  No follow-ups on file.  Reece Packer, MD  [facility] [address], [place], [place] [address] [phone]

## 2021-02-18 NOTE — Patient Instructions (Signed)
Please send Urine/Urine culture to our office

## 2021-03-13 ENCOUNTER — Other Ambulatory Visit: Payer: Self-pay | Admitting: Neurosurgery

## 2021-03-13 DIAGNOSIS — M4802 Spinal stenosis, cervical region: Secondary | ICD-10-CM

## 2021-03-25 ENCOUNTER — Other Ambulatory Visit: Payer: Self-pay | Admitting: Gastroenterology

## 2021-03-25 DIAGNOSIS — K219 Gastro-esophageal reflux disease without esophagitis: Secondary | ICD-10-CM

## 2021-03-25 DIAGNOSIS — R1084 Generalized abdominal pain: Secondary | ICD-10-CM

## 2021-03-25 DIAGNOSIS — R140 Abdominal distension (gaseous): Secondary | ICD-10-CM

## 2021-04-11 ENCOUNTER — Ambulatory Visit
Admission: RE | Admit: 2021-04-11 | Discharge: 2021-04-11 | Disposition: A | Discharge status: Home / Self Care | Payer: Medicare Other | Source: Ambulatory Visit | Attending: Neurosurgery | Admitting: Neurosurgery

## 2021-04-11 ENCOUNTER — Other Ambulatory Visit: Payer: Medicare Other

## 2021-04-11 DIAGNOSIS — M4802 Spinal stenosis, cervical region: Secondary | ICD-10-CM

## 2021-04-12 ENCOUNTER — Ambulatory Visit: Admission: RE | Admit: 2021-04-12 | Payer: Medicare Other | Source: Ambulatory Visit

## 2021-05-02 ENCOUNTER — Ambulatory Visit
Admission: RE | Admit: 2021-05-02 | Discharge: 2021-05-02 | Disposition: A | Discharge status: Home / Self Care | Payer: Medicare Other | Source: Ambulatory Visit | Attending: Gastroenterology | Admitting: Gastroenterology

## 2021-05-02 ENCOUNTER — Other Ambulatory Visit: Payer: Self-pay

## 2021-05-02 DIAGNOSIS — R140 Abdominal distension (gaseous): Secondary | ICD-10-CM

## 2021-05-02 DIAGNOSIS — R1084 Generalized abdominal pain: Secondary | ICD-10-CM | POA: Diagnosis present

## 2021-05-02 DIAGNOSIS — K219 Gastro-esophageal reflux disease without esophagitis: Secondary | ICD-10-CM

## 2021-05-02 LAB — POCT I-STAT CREATININE: Creatinine, Ser: 1.2 mg/dL (ref 0.61–1.24)

## 2021-05-02 MED ORDER — IOHEXOL 300 MG/ML  SOLN
100.0000 mL | Freq: Once | INTRAMUSCULAR | Status: AC | PRN
  Administered 2021-05-02: 100 mL via INTRAVENOUS

## 2021-08-02 ENCOUNTER — Other Ambulatory Visit: Payer: Self-pay | Admitting: Neurosurgery

## 2021-08-02 DIAGNOSIS — M25512 Pain in left shoulder: Secondary | ICD-10-CM

## 2021-08-27 ENCOUNTER — Encounter: Payer: Self-pay | Admitting: Internal Medicine

## 2021-08-28 ENCOUNTER — Ambulatory Visit
Admission: RE | Admit: 2021-08-28 | Discharge: 2021-08-28 | Disposition: A | Discharge status: Home / Self Care | Payer: Medicare Other | Source: Ambulatory Visit | Attending: Neurosurgery | Admitting: Neurosurgery

## 2021-08-28 ENCOUNTER — Encounter: Admission: RE | Payer: Self-pay | Source: Home / Self Care

## 2021-08-28 ENCOUNTER — Encounter: Payer: Self-pay | Admitting: Certified Registered"

## 2021-08-28 ENCOUNTER — Other Ambulatory Visit: Payer: Self-pay

## 2021-08-28 ENCOUNTER — Ambulatory Visit: Admission: RE | Admit: 2021-08-28 | Payer: Medicare Other | Source: Home / Self Care | Admitting: Internal Medicine

## 2021-08-28 DIAGNOSIS — M25512 Pain in left shoulder: Secondary | ICD-10-CM

## 2021-08-28 HISTORY — DX: Prediabetes: R73.03

## 2021-08-28 SURGERY — ESOPHAGOGASTRODUODENOSCOPY (EGD) WITH PROPOFOL
Anesthesia: General

## 2021-08-28 NOTE — Anesthesia Preprocedure Evaluation (Deleted)
Anesthesia Evaluation    Airway        Dental   Pulmonary sleep apnea and Continuous Positive Airway Pressure Ventilation , COPD, Current Smoker and Patient abstained from smoking., PE: 1 ppd.          Cardiovascular hypertension, +CHF       Neuro/Psych PSYCHIATRIC DISORDERS Anxiety Depression    GI/Hepatic   Endo/Other  Hypothyroidism Prediabetes; obesity  Renal/GU      Musculoskeletal   Abdominal   Peds  Hematology Thyroid and prostate cancers   Anesthesia Other Findings   Reproductive/Obstetrics                             Anesthesia Physical Anesthesia Plan  ASA: 3  Anesthesia Plan: General   Post-op Pain Management:    Induction: Intravenous  PONV Risk Score and Plan: 1 and Propofol infusion, TIVA and Treatment may vary due to age or medical condition  Airway Management Planned: Natural Airway  Additional Equipment:   Intra-op Plan:   Post-operative Plan:   Informed Consent:   Plan Discussed with:   Anesthesia Plan Comments:         Anesthesia Quick Evaluation

## 2021-10-21 ENCOUNTER — Other Ambulatory Visit: Payer: Self-pay | Admitting: Surgery

## 2021-10-30 ENCOUNTER — Inpatient Hospital Stay
Admission: RE | Admit: 2021-10-30 | Discharge: 2021-10-30 | Disposition: A | Discharge status: Home / Self Care | Payer: Medicare Other | Source: Ambulatory Visit

## 2021-10-30 NOTE — Progress Notes (Signed)
Unable to speak with a nurse at [facility] about this patient. Needs an EKG prior to surgery. Was able to speak with a nursing supervisor to schedule an appointment for tomorrow, 10/31/21 after his appointment with Dr. Roland Rack. Will change his phone interview appointment for inperson visit and to obtain the EKG. Nursing supervisor to fax medication list and diagnosis information.

## 2021-10-30 NOTE — Patient Instructions (Addendum)
Your procedure is scheduled on: Thursday, December 22 Report to the Registration Desk on the 1st floor of the [facility]. To find out your arrival time, please call [phone] between 1PM - 3PM on: Wednesday, December 21  REMEMBER: Instructions that are not followed completely may result in serious medical risk, up to and including death; or upon the discretion of your surgeon and anesthesiologist your surgery may need to be rescheduled.  Do not eat food after midnight the night before surgery.  No gum chewing, lozengers or hard candies.  You may however, drink CLEAR liquids up to 2 hours before you are scheduled to arrive for your surgery. Do not drink anything within 2 hours of your scheduled arrival time.  Clear liquids include: - water  - apple juice without pulp - gatorade (not RED, PURPLE, OR BLUE) - black coffee or tea (Do NOT add milk or creamers to the coffee or tea) Do NOT drink anything that is not on this list.  In addition, your doctor has ordered for you to drink the provided  Ensure Pre-Surgery Clear Carbohydrate Drink  Drinking this carbohydrate drink up to two hours before surgery helps to reduce insulin resistance and improve patient outcomes. Please complete drinking 2 hours prior to scheduled arrival time.  TAKE THESE MEDICATIONS THE MORNING OF SURGERY WITH A SIP OF WATER:  Albuterol inhaler Atorvastatin Symbicort inhaler Esomeprazole (Nexium) Fluoxetine (Prozac) Advair inhaler Gabapentin Isosorbide  Levothyroxine   (take one the night before and one on the morning of surgery - helps to prevent nausea after surgery.)  Use inhalers on the day of surgery and bring to the hospital.  **Follow new guidelines for insulin and diabetes medications.**  Follow recommendations from Cardiologist, Pulmonologist or PCP regarding stopping Aspirin, Coumadin, Plavix, Eliquis, Pradaxa, or Pletal.  One week prior to surgery: Stop Anti-inflammatories (NSAIDS) such  as Advil, Aleve, Ibuprofen, Motrin, Naproxen, Naprosyn and Aspirin based products such as Excedrin, Goodys Powder, BC Powder. Stop ANY OVER THE COUNTER supplements until after surgery. You may however, continue to take Tylenol if needed for pain up until the day of surgery.  No Alcohol for 24 hours before or after surgery.  No Smoking including e-cigarettes for 24 hours prior to surgery.  No chewable tobacco products for at least 6 hours prior to surgery.  No nicotine patches on the day of surgery.  Do not use any "recreational" drugs for at least a week prior to your surgery.  Please be advised that the combination of cocaine and anesthesia may have negative outcomes, up to and including death. If you test positive for cocaine, your surgery will be cancelled.  On the morning of surgery brush your teeth with toothpaste and water, you may rinse your mouth with mouthwash if you wish. Do not swallow any toothpaste or mouthwash.  Use CHG Soap or wipes as directed on instruction sheet.  Do not wear jewelry, make-up, hairpins, clips or nail polish.  Do not wear lotions, powders, or perfumes.   Do not shave body from the neck down 48 hours prior to surgery just in case you cut yourself which could leave a site for infection.  Also, freshly shaved skin may become irritated if using the CHG soap.  Contact lenses, hearing aids and dentures may not be worn into surgery.  Do not bring valuables to the hospital. [facility] is not responsible for any missing/lost belongings or valuables.   Bring your C-PAP to the hospital with you in case you may  have to spend the night.   Notify your doctor if there is any change in your medical condition (cold, fever, infection).  Wear comfortable clothing (specific to your surgery type) to the hospital.  After surgery, you can help prevent lung complications by doing breathing exercises.  Take deep breaths and cough every 1-2 hours. Your doctor may order a  device called an Incentive Spirometer to help you take deep breaths.  If you are being discharged the day of surgery, you will not be allowed to drive home. You will need a responsible adult (18 years or older) to drive you home and stay with you that night.   If you are taking public transportation, you will need to have a responsible adult (18 years or older) with you. Please confirm with your physician that it is acceptable to use public transportation.   Please call the [place] Dept. at [phone] if you have any questions about these instructions.  Surgery Visitation Policy:  Patients undergoing a surgery or procedure may have one family member or support person with them as long as that person is not COVID-19 positive or experiencing its symptoms.  That person may remain in the waiting area during the procedure and may rotate out with other people.

## 2021-10-31 ENCOUNTER — Encounter
Admission: RE | Admit: 2021-10-31 | Discharge: 2021-10-31 | Disposition: A | Discharge status: Home / Self Care | Payer: Medicare Other | Source: Ambulatory Visit | Attending: Surgery | Admitting: Surgery

## 2021-10-31 ENCOUNTER — Other Ambulatory Visit: Payer: Self-pay

## 2021-10-31 DIAGNOSIS — J449 Chronic obstructive pulmonary disease, unspecified: Secondary | ICD-10-CM | POA: Diagnosis not present

## 2021-10-31 DIAGNOSIS — I10 Essential (primary) hypertension: Secondary | ICD-10-CM | POA: Diagnosis not present

## 2021-10-31 DIAGNOSIS — Z01810 Encounter for preprocedural cardiovascular examination: Secondary | ICD-10-CM | POA: Diagnosis present

## 2021-10-31 HISTORY — DX: Gastro-esophageal reflux disease without esophagitis: K21.9

## 2021-10-31 HISTORY — DX: Dysphagia, unspecified: R13.10

## 2021-10-31 HISTORY — DX: Unspecified hemorrhoids: K64.9

## 2021-10-31 HISTORY — DX: Unspecified osteoarthritis, unspecified site: M19.90

## 2021-10-31 HISTORY — DX: Pneumonia, unspecified organism: J18.9

## 2021-10-31 HISTORY — DX: Type 2 diabetes mellitus without complications: E11.9

## 2021-10-31 NOTE — Patient Instructions (Addendum)
Your procedure is scheduled on: 11/07/21 - Thursday Report to the Registration Desk on the 1st floor of the [facility]. To find out your arrival time, please call [phone] between 1PM - 3PM on: 11/06/21 - Wednesday  REMEMBER: Instructions that are not followed completely may result in serious medical risk, up to and including death; or upon the discretion of your surgeon and anesthesiologist your surgery may need to be rescheduled.  Do not eat food after midnight the night before surgery.  No gum chewing, lozengers or hard candies.  You may however, drink CLEAR liquids up to 2 hours before you are scheduled to arrive for your surgery. Do not drink anything within 2 hours of your scheduled arrival time.  Clear liquids include: - water  Type 1 and Type 2 diabetics should only drink water.  TAKE THESE MEDICATIONS THE MORNING OF SURGERY WITH A SIP OF WATER:  - atorvastatin (LIPITOR) 40 MG tablet - esomeprazole (NEXIUM) 40 MG capsule, (take one the night before and one on the morning of surgery - helps to prevent nausea after surgery.) - FLUoxetine (PROZAC) 40 MG capsule - fluticasone-salmeterol (WIXELA INHUB) 250-50 MCG/ACT AEPB - isosorbide dinitrate (ISORDIL) 10 MG tablet - levothyroxine (SYNTHROID) 200 MCG tablet - oxyCODONE (OXY IR/ROXICODONE) 5 MG immediate release tablet - traZODone (DESYREL) 50 MG tablet  Use albuterol (VENTOLIN HFA) 108 (90 Base) MCG/ACT inhaler on the day of surgery and bring to the hospital.  metFORMIN (GLUCOPHAGE) 500 MG tablet - do not take 12/20, 12/21, and do not take the day of surgery.  Follow recommendations from Cardiologist, Pulmonologist or PCP regarding stopping Aspirin, Coumadin, Plavix, Eliquis, Pradaxa, or Pletal.  One week prior to surgery: Stop Anti-inflammatories (NSAIDS) such as Advil, Aleve, Ibuprofen, Motrin, Naproxen, Naprosyn and Aspirin based products such as Excedrin, Goodys Powder, BC Powder.  Stop ANY OVER THE COUNTER  supplements until after surgery.  You may take Tylenol if needed for pain up until the day of surgery.  No Alcohol for 24 hours before or after surgery.  No Smoking including e-cigarettes for 24 hours prior to surgery.  No chewable tobacco products for at least 6 hours prior to surgery.  No nicotine patches on the day of surgery.  Do not use any "recreational" drugs for at least a week prior to your surgery.  Please be advised that the combination of cocaine and anesthesia may have negative outcomes, up to and including death. If you test positive for cocaine, your surgery will be cancelled.  On the morning of surgery brush your teeth with toothpaste and water, you may rinse your mouth with mouthwash if you wish. Do not swallow any toothpaste or mouthwash.  Use CHG Soap or wipes as directed on instruction sheet.  Do not wear jewelry, make-up, hairpins, clips or nail polish.  Do not wear lotions, powders, or perfumes.   Do not shave body from the neck down 48 hours prior to surgery just in case you cut yourself which could leave a site for infection.  Also, freshly shaved skin may become irritated if using the CHG soap.  Contact lenses, hearing aids and dentures may not be worn into surgery.  Do not bring valuables to the hospital. [facility] is not responsible for any missing/lost belongings or valuables.   Bring your C-PAP to the hospital with you in case you may have to spend the night.   Notify your doctor if there is any change in your medical condition (cold, fever, infection).  Wear  comfortable clothing (specific to your surgery type) to the hospital.  After surgery, you can help prevent lung complications by doing breathing exercises.  Take deep breaths and cough every 1-2 hours. Your doctor may order a device called an Incentive Spirometer to help you take deep breaths. When coughing or sneezing, hold a pillow firmly against your incision with both hands. This is called  splinting. Doing this helps protect your incision. It also decreases belly discomfort.  If you are being admitted to the hospital overnight, leave your suitcase in the car. After surgery it may be brought to your room.  If you are being discharged the day of surgery, you will not be allowed to drive home. You will need a responsible adult (18 years or older) to drive you home and stay with you that night.   If you are taking public transportation, you will need to have a responsible adult (18 years or older) with you. Please confirm with your physician that it is acceptable to use public transportation.   Please call the [place] Dept. at [phone] if you have any questions about these instructions.  Surgery Visitation Policy:  Patients undergoing a surgery or procedure may have one family member or support person with them as long as that person is not COVID-19 positive or experiencing its symptoms.  That person may remain in the waiting area during the procedure and may rotate out with other people.  Inpatient Visitation:    Visiting hours are 7 a.m. to 8 p.m. Up to two visitors ages 16+ are allowed at one time in a patient room. The visitors may rotate out with other people during the day. Visitors must check out when they leave, or other visitors will not be allowed. One designated support person may remain overnight. The visitor must pass COVID-19 screenings, use hand sanitizer when entering and exiting the patients room and wear a mask at all times, including in the patients room. Patients must also wear a mask when staff or their visitor are in the room. Masking is required regardless of vaccination status.

## 2021-11-07 ENCOUNTER — Ambulatory Visit: Payer: Medicare Other | Admitting: Anesthesiology

## 2021-11-07 ENCOUNTER — Ambulatory Visit: Payer: Medicare Other

## 2021-11-07 ENCOUNTER — Ambulatory Visit
Admission: RE | Admit: 2021-11-07 | Discharge: 2021-11-07 | Disposition: A | Discharge status: Home / Self Care | Payer: Medicare Other | Attending: Surgery | Admitting: Surgery

## 2021-11-07 ENCOUNTER — Other Ambulatory Visit: Payer: Self-pay

## 2021-11-07 ENCOUNTER — Encounter
Admission: RE | Disposition: A | Discharge status: Home / Self Care | Payer: Self-pay | Source: Home / Self Care | Attending: Surgery

## 2021-11-07 ENCOUNTER — Encounter: Payer: Self-pay | Admitting: Surgery

## 2021-11-07 DIAGNOSIS — M25512 Pain in left shoulder: Secondary | ICD-10-CM

## 2021-11-07 DIAGNOSIS — M7542 Impingement syndrome of left shoulder: Secondary | ICD-10-CM | POA: Diagnosis not present

## 2021-11-07 DIAGNOSIS — E119 Type 2 diabetes mellitus without complications: Secondary | ICD-10-CM | POA: Diagnosis not present

## 2021-11-07 DIAGNOSIS — M65812 Other synovitis and tenosynovitis, left shoulder: Secondary | ICD-10-CM | POA: Insufficient documentation

## 2021-11-07 DIAGNOSIS — M7582 Other shoulder lesions, left shoulder: Secondary | ICD-10-CM | POA: Diagnosis not present

## 2021-11-07 DIAGNOSIS — I509 Heart failure, unspecified: Secondary | ICD-10-CM | POA: Diagnosis not present

## 2021-11-07 DIAGNOSIS — M75112 Incomplete rotator cuff tear or rupture of left shoulder, not specified as traumatic: Secondary | ICD-10-CM | POA: Diagnosis present

## 2021-11-07 DIAGNOSIS — Z87891 Personal history of nicotine dependence: Secondary | ICD-10-CM | POA: Diagnosis not present

## 2021-11-07 DIAGNOSIS — I110 Hypertensive heart disease with heart failure: Secondary | ICD-10-CM | POA: Diagnosis not present

## 2021-11-07 HISTORY — PX: SHOULDER ARTHROSCOPY WITH SUBACROMIAL DECOMPRESSION, ROTATOR CUFF REPAIR AND BICEP TENDON REPAIR: SHX5687

## 2021-11-07 LAB — GLUCOSE, CAPILLARY
Glucose-Capillary: 195 mg/dL — ABNORMAL HIGH (ref 70–99)
Glucose-Capillary: 179 mg/dL — ABNORMAL HIGH (ref 70–99)

## 2021-11-07 SURGERY — SHOULDER ARTHROSCOPY WITH SUBACROMIAL DECOMPRESSION, ROTATOR CUFF REPAIR AND BICEP TENDON REPAIR
Anesthesia: General | Site: Shoulder | Laterality: Left

## 2021-11-07 MED ORDER — ROCURONIUM BROMIDE 100 MG/10ML IV SOLN
INTRAVENOUS | Status: DC | PRN
  Administered 2021-11-07 (×2): 50 mg via INTRAVENOUS

## 2021-11-07 MED ORDER — ONDANSETRON HCL 4 MG/2ML IJ SOLN
INTRAMUSCULAR | Status: DC | PRN
  Administered 2021-11-07 (×2): 4 mg via INTRAVENOUS

## 2021-11-07 MED ORDER — OXYCODONE HCL 5 MG PO TABS: 5.0000 mg | ORAL_TABLET | ORAL | 0 refills | Status: DC | PRN

## 2021-11-07 MED ORDER — DEXAMETHASONE SODIUM PHOSPHATE 10 MG/ML IJ SOLN
INTRAMUSCULAR | Status: DC | PRN
  Administered 2021-11-07: 10 mg via INTRAVENOUS

## 2021-11-07 MED ORDER — BUPIVACAINE HCL (PF) 0.5 % IJ SOLN
INTRAMUSCULAR | Status: AC
  Filled 2021-11-07: qty 20

## 2021-11-07 MED ORDER — CHLORHEXIDINE GLUCONATE 0.12 % MT SOLN
OROMUCOSAL | Status: AC
  Administered 2021-11-07: 09:00:00 15 mL via OROMUCOSAL
  Filled 2021-11-07: qty 15

## 2021-11-07 MED ORDER — BUPIVACAINE LIPOSOME 1.3 % IJ SUSP
INTRAMUSCULAR | Status: AC
  Filled 2021-11-07: qty 10

## 2021-11-07 MED ORDER — BUPIVACAINE HCL (PF) 0.5 % IJ SOLN
INTRAMUSCULAR | Status: DC | PRN
  Administered 2021-11-07: 20 mL via PERINEURAL

## 2021-11-07 MED ORDER — LIDOCAINE HCL (PF) 1 % IJ SOLN
INTRAMUSCULAR | Status: AC
  Filled 2021-11-07: qty 5

## 2021-11-07 MED ORDER — CEFAZOLIN SODIUM-DEXTROSE 2-4 GM/100ML-% IV SOLN
2.0000 g | INTRAVENOUS | Status: AC
  Administered 2021-11-07: 11:00:00 1 g via INTRAVENOUS
  Administered 2021-11-07: 11:00:00 2 g via INTRAVENOUS

## 2021-11-07 MED ORDER — ONDANSETRON HCL 4 MG/2ML IJ SOLN: 4.0000 mg | Freq: Once | INTRAMUSCULAR | Status: DC | PRN

## 2021-11-07 MED ORDER — SUGAMMADEX SODIUM 500 MG/5ML IV SOLN
INTRAVENOUS | Status: DC | PRN
  Administered 2021-11-07: 500 mg via INTRAVENOUS

## 2021-11-07 MED ORDER — LACTATED RINGERS IV SOLN
INTRAVENOUS | Status: DC | PRN
  Administered 2021-11-07: 11:00:00 3001 mL

## 2021-11-07 MED ORDER — GLYCOPYRROLATE 0.2 MG/ML IJ SOLN
INTRAMUSCULAR | Status: DC | PRN
  Administered 2021-11-07: .2 mg via INTRAVENOUS

## 2021-11-07 MED ORDER — MIDAZOLAM HCL 2 MG/2ML IJ SOLN
INTRAMUSCULAR | Status: AC
  Administered 2021-11-07: 09:00:00 1 mg via INTRAVENOUS
  Filled 2021-11-07: qty 2

## 2021-11-07 MED ORDER — EPHEDRINE SULFATE 50 MG/ML IJ SOLN
INTRAMUSCULAR | Status: DC | PRN
  Administered 2021-11-07: 5 mg via INTRAVENOUS

## 2021-11-07 MED ORDER — FENTANYL CITRATE (PF) 100 MCG/2ML IJ SOLN
INTRAMUSCULAR | Status: DC | PRN
  Administered 2021-11-07 (×2): 50 ug via INTRAVENOUS

## 2021-11-07 MED ORDER — CEFAZOLIN SODIUM-DEXTROSE 2-4 GM/100ML-% IV SOLN
INTRAVENOUS | Status: AC
  Filled 2021-11-07: qty 100

## 2021-11-07 MED ORDER — MIDAZOLAM HCL 2 MG/2ML IJ SOLN: 1.0000 mg | INTRAMUSCULAR | Status: DC | PRN

## 2021-11-07 MED ORDER — SODIUM CHLORIDE 0.9 % IV SOLN: INTRAVENOUS | Status: DC

## 2021-11-07 MED ORDER — ORAL CARE MOUTH RINSE: 15.0000 mL | Freq: Once | OROMUCOSAL | Status: AC

## 2021-11-07 MED ORDER — ACETAMINOPHEN 10 MG/ML IV SOLN
INTRAVENOUS | Status: DC | PRN
  Administered 2021-11-07: 1000 mg via INTRAVENOUS

## 2021-11-07 MED ORDER — ACETAMINOPHEN 10 MG/ML IV SOLN
INTRAVENOUS | Status: AC
  Filled 2021-11-07: qty 100

## 2021-11-07 MED ORDER — CHLORHEXIDINE GLUCONATE 0.12 % MT SOLN: 15.0000 mL | Freq: Once | OROMUCOSAL | Status: AC

## 2021-11-07 MED ORDER — PROPOFOL 10 MG/ML IV BOLUS
INTRAVENOUS | Status: DC | PRN
  Administered 2021-11-07: 150 mg via INTRAVENOUS

## 2021-11-07 MED ORDER — BUPIVACAINE-EPINEPHRINE 0.5% -1:200000 IJ SOLN
INTRAMUSCULAR | Status: DC | PRN
  Administered 2021-11-07: 30 mL

## 2021-11-07 MED ORDER — BUPIVACAINE-EPINEPHRINE (PF) 0.5% -1:200000 IJ SOLN
INTRAMUSCULAR | Status: AC
  Filled 2021-11-07: qty 30

## 2021-11-07 MED ORDER — FENTANYL CITRATE (PF) 100 MCG/2ML IJ SOLN
INTRAMUSCULAR | Status: AC
  Filled 2021-11-07: qty 2

## 2021-11-07 MED ORDER — FENTANYL CITRATE (PF) 100 MCG/2ML IJ SOLN: 25.0000 ug | INTRAMUSCULAR | Status: DC | PRN

## 2021-11-07 MED ORDER — EPINEPHRINE PF 1 MG/ML IJ SOLN
INTRAMUSCULAR | Status: AC
  Filled 2021-11-07: qty 1

## 2021-11-07 MED ORDER — PHENYLEPHRINE HCL-NACL 20-0.9 MG/250ML-% IV SOLN
INTRAVENOUS | Status: DC | PRN
  Administered 2021-11-07: 20 ug/min via INTRAVENOUS

## 2021-11-07 MED ORDER — LIDOCAINE HCL (PF) 1 % IJ SOLN
INTRAMUSCULAR | Status: DC | PRN
  Administered 2021-11-07: 3 mL via SUBCUTANEOUS

## 2021-11-07 MED ORDER — BUPIVACAINE LIPOSOME 1.3 % IJ SUSP
INTRAMUSCULAR | Status: DC | PRN
  Administered 2021-11-07: 10 mL via PERINEURAL

## 2021-11-07 MED ORDER — LIDOCAINE HCL (CARDIAC) PF 100 MG/5ML IV SOSY
PREFILLED_SYRINGE | INTRAVENOUS | Status: DC | PRN
  Administered 2021-11-07: 60 mg via INTRAVENOUS

## 2021-11-07 SURGICAL SUPPLY — 57 items
ANCH SUT 2 2.9 2 LD TPR NDL (Anchor) ×1 IMPLANT
ANCH SUT BN ASCP DLV (Anchor) ×1 IMPLANT
ANCH SUT RGNRT REGENETEN (Staple) ×1 IMPLANT
ANCHOR BONE REGENETEN (Anchor) ×2 IMPLANT
ANCHOR JUGGERKNOT WTAP NDL 2.9 (Anchor) ×2 IMPLANT
ANCHOR TENDON REGENETEN (Staple) ×2 IMPLANT
APL PRP STRL LF DISP 70% ISPRP (MISCELLANEOUS) ×1
BIT DRILL JUGRKNT W/NDL BIT2.9 (DRILL) IMPLANT
BLADE FULL RADIUS 3.5 (BLADE) ×3 IMPLANT
BUR ACROMIONIZER 4.0 (BURR) ×3 IMPLANT
CANNULA SHAVER 8MMX76MM (CANNULA) ×3 IMPLANT
CHLORAPREP W/TINT 26 (MISCELLANEOUS) ×3 IMPLANT
COVER MAYO STAND REUSABLE (DRAPES) ×3 IMPLANT
DILATOR 5.5 THREADED HEALICOIL (MISCELLANEOUS) IMPLANT
DRAPE IMP U-DRAPE 54X76 (DRAPES) ×6 IMPLANT
DRILL JUGGERKNOT W/NDL BIT 2.9 (DRILL) ×3
ELECT CAUTERY BLADE 6.4 (BLADE) ×3 IMPLANT
ELECT REM PT RETURN 9FT ADLT (ELECTROSURGICAL) ×3
ELECTRODE REM PT RTRN 9FT ADLT (ELECTROSURGICAL) ×1 IMPLANT
GAUZE SPONGE 4X4 12PLY STRL (GAUZE/BANDAGES/DRESSINGS) ×3 IMPLANT
GAUZE XEROFORM 1X8 LF (GAUZE/BANDAGES/DRESSINGS) ×3 IMPLANT
GLOVE SRG 8 PF TXTR STRL LF DI (GLOVE) ×1 IMPLANT
GLOVE SURG ENC MOIS LTX SZ7.5 (GLOVE) ×6 IMPLANT
GLOVE SURG ENC MOIS LTX SZ8 (GLOVE) ×6 IMPLANT
GLOVE SURG UNDER LTX SZ8 (GLOVE) ×3 IMPLANT
GLOVE SURG UNDER POLY LF SZ8 (GLOVE) ×3
GOWN STRL REUS W/ TWL LRG LVL3 (GOWN DISPOSABLE) ×1 IMPLANT
GOWN STRL REUS W/ TWL XL LVL3 (GOWN DISPOSABLE) ×1 IMPLANT
GOWN STRL REUS W/TWL LRG LVL3 (GOWN DISPOSABLE) ×3
GOWN STRL REUS W/TWL XL LVL3 (GOWN DISPOSABLE) ×3
GRASPER SUT 15 45D LOW PRO (SUTURE) IMPLANT
IMPL REGENETEN MEDIUM (Shoulder) IMPLANT
IMPLANT REGENETEN MEDIUM (Shoulder) ×3 IMPLANT
IV LACTATED RINGER IRRG 3000ML (IV SOLUTION) ×6
IV LR IRRIG 3000ML ARTHROMATIC (IV SOLUTION) ×2 IMPLANT
KIT CANNULA 8X76-LX IN CANNULA (CANNULA) IMPLANT
MANIFOLD NEPTUNE II (INSTRUMENTS) ×6 IMPLANT
MASK FACE SPIDER DISP (MASK) ×3 IMPLANT
MAT ABSORB  FLUID 56X50 GRAY (MISCELLANEOUS) ×3
MAT ABSORB FLUID 56X50 GRAY (MISCELLANEOUS) ×1 IMPLANT
PACK ARTHROSCOPY SHOULDER (MISCELLANEOUS) ×3 IMPLANT
PASSER SUT FIRSTPASS SELF (INSTRUMENTS) IMPLANT
SLING ARM LRG DEEP (SOFTGOODS) ×3 IMPLANT
SLING ULTRA II LG (MISCELLANEOUS) ×3 IMPLANT
SPONGE T-LAP 18X18 ~~LOC~~+RFID (SPONGE) ×3 IMPLANT
STAPLER SKIN PROX 35W (STAPLE) ×3 IMPLANT
STRAP SAFETY 5IN WIDE (MISCELLANEOUS) ×3 IMPLANT
SUT ETHIBOND 0 MO6 C/R (SUTURE) ×3 IMPLANT
SUT ULTRABRAID 2 COBRAID 38 (SUTURE) IMPLANT
SUT VIC AB 2-0 CT1 27 (SUTURE) ×6
SUT VIC AB 2-0 CT1 TAPERPNT 27 (SUTURE) ×2 IMPLANT
TAPE MICROFOAM 4IN (TAPE) ×3 IMPLANT
TUBING CONNECTING 10 (TUBING) ×2 IMPLANT
TUBING CONNECTING 10' (TUBING) ×1
TUBING INFLOW SET DBFLO PUMP (TUBING) ×3 IMPLANT
WAND WEREWOLF FLOW 90D (MISCELLANEOUS) ×3 IMPLANT
WATER STERILE IRR 500ML POUR (IV SOLUTION) ×3 IMPLANT

## 2021-11-07 NOTE — Anesthesia Preprocedure Evaluation (Signed)
Anesthesia Evaluation  Patient identified by MRN, date of birth, ID band Patient awake    Reviewed: Allergy & Precautions, H&P , NPO status , Patient's Chart, lab work & pertinent test results, reviewed documented beta blocker date and time   Airway Mallampati: II  TM Distance: >3 FB Neck ROM: full    Dental  (+) Teeth Intact   Pulmonary neg pulmonary ROS, shortness of breath, sleep apnea and Continuous Positive Airway Pressure Ventilation , pneumonia, COPD, Patient abstained from smoking., former smoker,    Pulmonary exam normal        Cardiovascular Exercise Tolerance: Poor hypertension, On Medications +CHF  negative cardio ROS Normal cardiovascular exam+ Valvular Problems/Murmurs  Rhythm:regular Rate:Normal     Neuro/Psych PSYCHIATRIC DISORDERS Anxiety Depression  Neuromuscular disease negative neurological ROS  negative psych ROS   GI/Hepatic negative GI ROS, Neg liver ROS, GERD  Medicated,  Endo/Other  negative endocrine ROSdiabetesHypothyroidism   Renal/GU Renal diseasenegative Renal ROS  negative genitourinary   Musculoskeletal   Abdominal   Peds  Hematology negative hematology ROS (+)   Anesthesia Other Findings Past Medical History: 05/05/2014: Anxiety disorder     Comment:  unspecifed No date: Arthritis No date: CHF (congestive heart failure) (HCC) No date: Colonic polyp No date: COPD (chronic obstructive pulmonary disease) (HCC) No date: Depression No date: Diabetes mellitus without complication (HCC) No date: Dysphagia No date: Dyspnea No date: Elevated prostate specific antigen (PSA) 05/05/2014: Essential (primary) hypertension No date: GERD (gastroesophageal reflux disease) No date: GI bleed     Comment:  due to diverticulitis  No date: Heart murmur     Comment:  hx of 1960s No date: Hemorrhoids 05/05/2014: History of thyroid cancer No date: Hypercholesteremia No date: Hyperlipidemia No  date: Hypertension 05/05/2014: Hypothyroidism 12/29/2012: Male erectile dysfunction No date: Malignant neoplasm of prostate [facility])     Comment:  10/30/2011 T1c - Identified by needle biopsy . Gleason 7              (3+4) 5 of 12 cores, Bilateral  No date: Pneumonia 06/22/2017: Prediabetes No date: Sleep apnea     Comment:  CPAP , report on chart , not used in last 2 weeks  No date: Urinary obstruction     Comment:  not elsewhere classified Past Surgical History: No date: BACK SURGERY     Comment:  lumbar 1987  No date: BACK SURGERY     Comment:  C-6- Plate&Pin 09/15/2018: CATARACT EXTRACTION W/PHACO; Right     Comment:  Procedure: CATARACT EXTRACTION PHACO AND INTRAOCULAR               LENS PLACEMENT ([place]) RIGHT;  Surgeon: Leandrew Koyanagi, MD;  Location: [place];  Service:               Ophthalmology;  Laterality: Right;  Requests to be last               case 10/06/2018: CATARACT EXTRACTION W/PHACO; Left     Comment:  Procedure: CATARACT EXTRACTION PHACO AND INTRAOCULAR               LENS PLACEMENT ([place])  LEFT;  Surgeon: Leandrew Koyanagi, MD;  Location: [place];  Service:               Ophthalmology;  Laterality: Left; No date:  CERVICAL DISC SURGERY No date: COLONOSCOPY     Comment:  10/06/2011, 06/05/2006, 05/06/2002 adematous polyps: CBF               09/2014; Recall Ltr mailed 02/27/2015 (dw) 07/06/2015: COLONOSCOPY WITH PROPOFOL; N/A     Comment:  Procedure: COLONOSCOPY WITH PROPOFOL;  Surgeon: Manya Silvas, MD;  Location: [facility] ENDOSCOPY;  Service:               Endoscopy;  Laterality: N/A; 09/01/2018: COLONOSCOPY WITH PROPOFOL; N/A     Comment:  Procedure: COLONOSCOPY WITH PROPOFOL;  Surgeon: Manya Silvas, MD;  Location: [facility] ENDOSCOPY;  Service:               Endoscopy;  Laterality: N/A; 04/17/2020: COLONOSCOPY WITH PROPOFOL; N/A     Comment:  Procedure: COLONOSCOPY WITH PROPOFOL;   Surgeon: Lucilla Lame, MD;  Location: [facility] ENDOSCOPY;  Service:               Endoscopy;  Laterality: N/A; 04/17/2020: ESOPHAGOGASTRODUODENOSCOPY (EGD) WITH PROPOFOL; N/A     Comment:  Procedure: ESOPHAGOGASTRODUODENOSCOPY (EGD) WITH               PROPOFOL;  Surgeon: Lucilla Lame, MD;  Location: [facility]               ENDOSCOPY;  Service: Endoscopy;  Laterality: N/A; No date: EYE SURGERY No date: HERNIA REPAIR     Comment:  umbilical hernia repair  No date: LAMINECTOMY FOR EXCISION / EVACUATION INTRASPINAL LESION     Comment:  lumbar No date: OTHER SURGICAL HISTORY     Comment:  right rotator cuff surgery  No date: OTHER SURGICAL HISTORY     Comment:  bilateral tubes in ears  10/27/2020: POSTERIOR CERVICAL LAMINECTOMY; N/A     Comment:  Procedure: POSTERIOR CERVICAL LAMINECTOMY CERVICAL               THREE-CERVICAL SIX;  Surgeon: Judith Part, MD;                Location: [place];  Service: Neurosurgery;  Laterality:               N/A; No date: POSTERIOR LAMINECTOMY / DECOMPRESSION CERVICAL SPINE No date: PROSTATE BIOPSY     Comment:  10/22/2011 Volume:55.8 cc's, PSA:4.5, Free PSA:12% 12/15/2011: ROBOT ASSISTED LAPAROSCOPIC RADICAL PROSTATECTOMY     Comment:  Procedure: ROBOTIC ASSISTED LAPAROSCOPIC RADICAL               PROSTATECTOMY LEVEL 2;  Surgeon: Dutch Gray, MD;                Location: [facility];  Service: Urology;  Laterality: N/A;                   No date: ROTATOR CUFF REPAIR; Right No date: TONSILLECTOMY No date: TOTAL THYROIDECTOMY   Reproductive/Obstetrics negative OB ROS                             Anesthesia Physical Anesthesia Plan  ASA: 3  Anesthesia Plan: General ETT   Post-op Pain Management: Regional block   Induction:   PONV Risk Score and Plan: 3  Airway Management  Planned: Video Laryngoscope Planned  Additional Equipment:   Intra-op Plan:   Post-operative Plan:   Informed Consent: I have reviewed the  patients History and Physical, chart, labs and discussed the procedure including the risks, benefits and alternatives for the proposed anesthesia with the patient or authorized representative who has indicated his/her understanding and acceptance.     Dental Advisory Given  Plan Discussed with: CRNA  Anesthesia Plan Comments:         Anesthesia Quick Evaluation

## 2021-11-07 NOTE — Op Note (Addendum)
11/07/2021  12:12 PM  Patient:   [name] [name]  Pre-Op Diagnosis:   Impingement/tendinopathy with partial-thickness rotator cuff tear and biceps tendinopathy, left shoulder.  Post-Op Diagnosis:   Impingement/tendinopathy with partial-thickness rotator cuff tear, extensive synovitis, and biceps tendinopathy, left shoulder.  Procedure:   Extensive arthroscopic debridement, arthroscopic subacromial decompression, mini-open rotator cuff repair using a [employer] Regeneten patch, and biceps tenolysis, left shoulder.  Anesthesia:   General endotracheal with interscalene block using Exparel placed preoperatively by the anesthesiologist.  Surgeon:   Pascal Lux, MD  Assistant:   Cameron Proud, PA-C  Findings:   As above.  There was extensive partial-thickness tearing involving the articular insertional fibers of the supraspinatus tendon with a possible small area of full-thickness tearing.  The remaining portions of the rotator cuff appear to be in satisfactory condition.  There was moderate tendinopathy of the biceps tendon, as well as degenerative tearing of the superior portion of the labrum without frank detachment from the glenoid rim.  In addition, moderate synovitis anteriorly and superiorly was identified.  Complications:   None  Fluids:   700 cc  Estimated blood loss:   10 cc  Tourniquet time:   None  Drains:   None  Closure:   Staples      Brief clinical note:   The patient is a 75 year old male with a 6+ month history of progressively worsening left shoulder pain. The patient's symptoms have progressed despite medications, activity modification, etc. The patient's history and examination are consistent with impingement/tendinopathy with a possible rotator cuff tear. His preoperative MRI scan demonstrated the presence of a significant articular-sided partial-thickness tear of the supraspinatus tendon as well as significant biceps tendinopathy. The patient presents at this  time for definitive management of these shoulder symptoms.  Procedure:   The patient underwent placement of an interscalene block using Exparel by the anesthesiologist in the preoperative holding area before being brought into the operating room and lain in the supine position. The patient then underwent general endotracheal intubation and anesthesia before being repositioned in the beach chair position using the beach chair positioner. The left shoulder and upper extremity were prepped with ChloraPrep solution before being draped sterilely. Preoperative antibiotics were administered. A timeout was performed to confirm the proper surgical site before the expected portal sites and incision site were injected with 0.5% Sensorcaine with epinephrine.   A posterior portal was created and the glenohumeral joint thoroughly inspected with the findings as described above. An anterior portal was created using an outside-in technique. The labrum and rotator cuff were further probed, again confirming the above-noted findings. The areas of labral fraying were debrided back to stable margins as were the torn margin of the rotator cuff. In addition, areas of extensive synovitis also were debrided back to stable margins using the full-radius resector. The ArthroCare wand was inserted and used to release the biceps tendon from its labral anchor. It also was used to obtain hemostasis as well as to "anneal" the labrum superiorly and anteriorly. The instruments were removed from the joint after suctioning the excess fluid.  The camera was repositioned through the posterior portal into the subacromial space. A separate lateral portal was created using an outside-in technique. The 3.5 mm full-radius resector was introduced and used to perform a subtotal bursectomy. The ArthroCare wand was then inserted and used to remove the periosteal tissue off the undersurface of the anterior third of the acromion as well as to recess the  coracoacromial ligament  from its attachment along the anterior and lateral margins of the acromion. The 4.0 mm acromionizing bur was introduced and used to complete the decompression by removing the undersurface of the anterior third of the acromion. The full radius resector was reintroduced to remove any residual bony debris before the ArthroCare wand was reintroduced to obtain hemostasis. The instruments were then removed from the subacromial space after suctioning the excess fluid.  An approximately 4-5 cm incision was made over the anterolateral aspect of the shoulder beginning at the anterolateral corner of the acromion and extending distally in line with the bicipital groove. This incision was carried down through the subcutaneous tissues to expose the deltoid fascia. The raphae between the anterior and middle thirds was identified and this plane developed to provide access into the subacromial space. Additional bursal tissues were debrided sharply using Metzenbaum scissors. The rotator cuff tear was readily identified by palpation. Utilizing a #15 blade, a longitudinal slit was made in the remaining intact fibers of the supraspinatus tendon to provide access to the greater tuberosity. The exposed greater tuberosity roughened with a rongeur. The tear was repaired using a single Biomet 2.9 mm JuggerKnot anchor. In addition, a single #0 Ethibond interrupted suture was used to close the more medial portion of the longitudinal split in the tendon in a side-to-side fashion. An apparent watertight closure was obtained.    Given the patient's age and the quality of the tissue, it was felt best also to reinforce this repair using a [place] patch. Therefore, a medium patch was selected and applied over the repair site, then secured using the appropriate bone and soft tissue staples. Performing this additional procedure increased the complexity of the case and added 15-20 minutes of additional  surgical time to the procedure.      The wound was copiously irrigated with sterile saline solution before the deltoid raphae was reapproximated using 2-0 Vicryl interrupted sutures. The subcutaneous tissues were closed in two layers using 2-0 Vicryl interrupted sutures before the skin was closed using staples. The portal sites also were closed using staples. A sterile bulky dressing was applied to the shoulder before the arm was placed into a shoulder immobilizer. The patient was then awakened, extubated, and returned to the recovery room in satisfactory condition after tolerating the procedure well.

## 2021-11-07 NOTE — Discharge Instructions (Addendum)
Orthopedic discharge instructions: Keep dressing dry and intact.  May sponge bathe after dressing changed on post-op day #4 (Monday).  Cover staples with Band-Aids after drying off. Apply ice frequently to shoulder. Take ibuprofen 600 mg TID with meals for 7-10 days, then as necessary. Take oxycodone as prescribed when needed.  May supplement with ES Tylenol if necessary. Keep shoulder immobilizer on at all times except may remove for bathing purposes. Follow-up in 10-14 days or as scheduled.  AMBULATORY SURGERY  DISCHARGE INSTRUCTIONS   The drugs that you were given will stay in your system until tomorrow so for the next 24 hours you should not:  Drive an automobile Make any legal decisions Drink any alcoholic beverage   You may resume regular meals tomorrow.  Today it is better to start with liquids and gradually work up to solid foods.  You may eat anything you prefer, but it is better to start with liquids, then soup and crackers, and gradually work up to solid foods.   Please notify your doctor immediately if you have any unusual bleeding, trouble breathing, redness and pain at the surgery site, drainage, fever, or pain not relieved by medication.    Additional Instructions:    Please contact your physician with any problems or Same Day Surgery at [phone], Monday through Friday 6 am to 4 pm, or [place] [facility] number at [phone].   SHOULDER SLING IMMOBILIZER   VIDEO Slingshot 2 Shoulder Brace Application - YouTube ---[email or web address]  INSTRUCTIONS While supporting the injured arm, slide the forearm into the sling. Wrap the adjustable shoulder strap around the neck and shoulders and attach the strap end to the sling using  the alligator strap tab.  Adjust the shoulder strap to the required length. Position the shoulder pad behind the neck. To secure the shoulder pad location (optional), pull the shoulder strap away  from the shoulder pad, unfold the hook material on the top of the pad, then press the shoulder strap back onto the hook material to secure the pad in place. Attach the closure strap across the open top of the sling. Position the strap so that it holds the arm securely in the sling. Next, attach the thumb strap to the open end of the sling between the thumb and fingers. After sling has been fit, it may be easily removed and reapplied using the quick release buckle on shoulder strap. If a neutral pillow or 15 abduction pillow is included, place the pillow at the waistline. Attach the sling to the pillow, lining up hook material on the pillow with the loop on sling. Adjust the waist strap to fit.  If waist strap is too long, cut it to fit. Use the small piece of double sided hook material (located on top of the pillow) to secure the strap end. Place the double sided hook material on the inside of the cut strap end and secure it to the waist strap.     If no pillow is included, attach the waist strap to the sling and adjust to fit.    Washing Instructions: Straps and sling must be removed and cleaned regularly depending on your activity level and perspiration. Hand wash straps and sling in cold water with mild detergent, rinse, air dry

## 2021-11-07 NOTE — H&P (Signed)
History of Present Illness:  [name] [name] is a 75 y.o. male that presents to clinic today for his preoperative history and evaluation. Patient presents with aide from nursing home. The patient is scheduled to undergo a left shoulder arthroscopy with debridement, decompression, rotator cuff repair, and biceps tenodesis on 11/07/21 by Dr. Roland Rack. The patient reports a 61-month history of left shoulder pain. Patient sustained his injury after a fall while recovering from delirium. Pain is felt over the lateral aspect of the arm and shoulder as well as anterior shoulder. Symptoms are described as aching, constant, miserable, nagging, stabbing, and throbbing. MRI of the left shoulder revealed partial-thickness tear of the supraspinatus with retraction to the humeral head as well as partial-thickness humeral head and glenoid cartilage loss.  The patient's symptoms have progressed to the point that they decrease his quality of life. The patient has previously undergone conservative treatment including Tylenol, narcotics, and activity modification without adequate control of his symptoms. Of note, patient does have history of both an anterior and posterior cervical procedure by neurosurgeon in [place] in December 2021. Patient reports radiation of pain down the arm to his hand as well as associated numbness and paresthesias extending down the arm to the hand.  Of note, patient takes oxy 5 mg PRN.   Patient has received all necessary clearances for surgery. He has seen Dr Saralyn Pilar in Cardiology. Denies significant drug allergy, though allergy to Ibuprofen is listed.  Past Medical History:   Anxiety   Anxiety disorder 05/05/2014   Colon polyp   COPD (chronic obstructive pulmonary disease) (CMS-HCC)   Depression   Essential (primary) hypertension 05/05/2014   Hx of thyroid cancer 05/05/2014   Hyperlipidemia   Hypertension   Hypothyroidism, postsurgical   Male erectile dysfunction 12/29/2012    Prediabetes 06/22/2017   Prostate cancer (CMS-HCC)   Sleep apnea   SOB (shortness of breath) on exertion 07/11/2015   Past Surgical History:   BACK SURGERY (Laminectomy excision/evacuation extradural intraspinal lesion)   CATARACT EXTRACTION Bilateral   COLONOSCOPY 10/06/2011, 06/05/2006, 04/18/2002  Adenomatous Polyps: [facility] 09/2014; Recall Ltr mailed 02/27/2015 (dw)   COLONOSCOPY 07/06/2015  Adenomatous Polyps: [facility] 06/2018: Recall ltr mailed 05/06/18 (kj)   COLONOSCOPY   POSTERIOR LAMINECTOMY / DECOMPRESSION CERVICAL SPINE   rotator cuff surgery   THYROIDECTOMY TOTAL   Current Medications:   albuterol 90 mcg/actuation inhaler Inhale 2 inhalations into the lungs every 4 (four) hours as needed for Wheezing 18 g 0   atorvastatin (LIPITOR) 40 MG tablet Take 40 mg by mouth once daily Take 1 tablet (40 mg total) by mouth daily.   BISACODYL ORAL Take 10 mg by mouth Every 3rd day   dicyclomine (BENTYL) 10 mg capsule Take 1 capsule (10 mg total) by mouth 3 (three) times daily as needed (abd pain) 60 capsule 3   docusate (COLACE) 100 MG capsule Take 100 mg by mouth 2 (two) times daily Take 100 mg by mouth 2 (two) times daily   donepeziL (ARICEPT) 10 MG tablet Take 1 tablet (10 mg total) by mouth at bedtime   ergocalciferol, vitamin D2, 1,250 mcg (50,000 unit) capsule Take 1 capsule (50,000 Units total) by mouth once a week 8 capsule 0   esomeprazole (NEXIUM) 40 MG DR capsule Take 1 capsule (40 mg total) by mouth 2 (two) times daily Take 30 min before breakfast and supper 60 capsule 3   FLUoxetine (PROZAC) 40 MG capsule Take 1 capsule (40 mg total) by mouth once daily 30 capsule 5  FUROsemide (LASIX) 40 MG tablet Take 1 tablet (40 mg total) by mouth 2 (two) times daily 60 tablet 11   hydrocortisone (ANUSOL-HC) 2.5 % rectal cream Place rectally every 12 (twelve) hours   isosorbide dinitrate (ISORDIL) 10 MG tablet Take 10 mg by mouth 3 (three) times daily Take 1 tablet (10 mg total) by mouth 3 (three)  times daily   levothyroxine (SYNTHROID) 200 MCG tablet Take 1 tablet (200 mcg total) by mouth once daily   magnesium oxide (MAG-OX) 400 mg (241.3 mg magnesium) tablet Take 1 tablet (400 mg total) by mouth 2 (two) times daily   meclizine (ANTIVERT) 25 mg tablet Take 1 tablet (25 mg total) by mouth 3 (three) times daily   melatonin 5 mg Cap Take by mouth at bedtime   metFORMIN (GLUCOPHAGE) 500 MG tablet Take by mouth Take 500 mg by mouth daily.   midodrine (PROAMATINE) 5 MG tablet Take 5 mg by mouth 3 (three) times daily   oxyCODONE 5 mg immediate release capsule Take 5 mg by mouth every 4 (four) hours as needed for Pain   potassium chloride (KLOR-CON) 20 MEQ ER tablet Take by mouth Take 20 mEq by mouth 3 (three) times daily.   prazosin (MINIPRESS) 1 MG capsule Take 1 capsule (1 mg total) by mouth nightly 30 capsule 11   pregabalin (LYRICA) 150 MG capsule 1 capsule (150 mg total) once daily   simethicone 125 mg Cap Take 125 mg by mouth 2 (two) times daily   SORE THROAT, BENZOCAINE-MENTH, 15-3.6 mg lozenge 1 lozenge every 2 (two) hours as needed   tiZANidine (ZANAFLEX) 4 MG capsule Take 4 mg by mouth 3 (three) times daily   traZODone (DESYREL) 100 MG tablet Take 1 tablet (100 mg total) by mouth at bedtime 90 tablet 3   traZODone (DESYREL) 50 MG tablet Take 25 mg each morning   verapamiL (CALAN-SR) 180 MG SR tablet Take 1 tablet (180 mg total) by mouth once daily 180 tablet 0   WIXELA INHUB 250-50 mcg/dose diskus inhaler 1 inhalation every 12 (twelve) hours   Allergies:   Ibuprofen Angioedema   Social History:   Socioeconomic History:   Marital status: Single   Years of education: associates degree   Highest education level: Associate degree: academic program  Occupational History   Occupation: endoscopy Merchant navy officer  Comment: [employer]   Occupation: OR Merchant navy officer  Tobacco Use   Smoking status: Former  Types: Cigarettes   Smokeless tobacco: Former  Scientific laboratory technician Use: Never  used  Substance and Sexual Activity   Alcohol use: Not Currently  Alcohol/week: 7.0 standard drinks  Types: 7 Glasses of wine per week  Comment: once in a while   Drug use: Not Currently   Sexual activity: Not Currently   Social Determinants of Health:   Emergency planning/management officer Strain: Low Risk   Difficulty of Paying Living Expenses: Not hard at all  Food Insecurity: No Food Insecurity   Worried About Charity fundraiser in the Last Year: Never true   Arboriculturist in the Last Year: Never true  Transportation Needs: No Transportation Needs   Lack of Transportation (Medical): No   Lack of Transportation (Non-Medical): No  Physical Activity: Inactive   Days of Exercise per Week: 0 days   Minutes of Exercise per Session: 0 min  Stress: No Stress Concern Present   Feeling of Stress : Not at all  Social Connections: Unknown   Frequency of Communication with Friends  and Family: More than three times a week   Frequency of Social Gatherings with Friends and Family: More than three times a week   Family History:   Coronary Artery Disease (Blocked arteries around heart) Mother   Coronary Artery Disease (Blocked arteries around heart) Father   Review of Systems:   A 10+ ROS was performed, reviewed, and the pertinent orthopaedic findings are documented in the HPI.   Physical Examination:  There were no vitals taken for this visit.  Patient is a well-developed, well-nourished male in no acute distress. Patient has normal mood and affect. Patient is alert and oriented to person, place, and time.   HEENT: Atraumatic, normocephalic. Pupils equal and reactive to light. Extraocular motion intact. Noninjected sclera.  Cardiovascular: Regular rate and rhythm, with no murmurs, rubs, or gallops. Radial pulse 2+  Respiratory: Lungs clear to auscultation bilaterally.   Left shoulder exam: SKIN: normal SWELLING: none WARMTH: none LYMPH NODES: no adenopathy palpable CREPITUS: none TENDERNESS:  Moderate tenderness diffusely to light touch over the anterior, lateral, and posterior aspects of the left shoulder. ROM (active):  Forward flexion: 70 degrees Abduction: 40 degrees Internal rotation: Left buttock ROM (passive):  Forward flexion: 90 degrees Abduction: 80 degrees ER/IR at 90 abd: 85 degrees / 45 degrees   He has moderate pain with all motions.   STRENGTH: Forward flexion: 4/5 Abduction: 4/5 External rotation: 4/5 Internal rotation: 4/5 Pain with RC testing: Mild-moderate pain with resisted strength testing in all planes.   STABILITY: Normal   SPECIAL TESTS: Luan Pulling' test: positive, moderate Speed's test: positive Capsulitis - pain w/ passive ER: no Crossed arm test: Moderately positive Crank: Not evaluated Anterior apprehension: Negative Posterior apprehension: Not evaluated  He exhibits decreased grip strength and intrinsic strength to the left hand. He has good capillary refill to his left hand.  Sensation is intact to pinprick over the median, radial, ulnar, and axillary nerve distributions. Increased sensitivity over the palmar aspect of the hand across all distributions. Patient able to make an OK sign, thumbs up, and criss-cross the 2nd and 3rd digits.   Left Shoulder Imaging, MRI: MRI Shoulder Cartilage: Partial thickness humeral head cartilage loss. Partial thickness glenoid cartilage loss. MRI Shoulder Rotator Cuff: Partial thickness tear of the supraspinatus. Retracted to the humeral head. MRI Shoulder Labrum / Biceps: Biceps tendinopathy. MRI Shoulder Bone: Normal bone.  No new radiographs were obtained today.   Impression:  1. Rotator cuff tendinitis, left. 2. Injury of tendon of long head of left biceps.  3. Nontraumatic incomplete tear of left rotator cuff.   Plan:   The treatment options, including both surgical and nonsurgical choices, have been discussed in detail with the patient and his/her family. The risks (including bleeding,  infection, nerve and/or blood vessel injury, persistent or recurrent pain, loosening or failure of the components, leg length inequality, dislocation, need for further surgery, blood clots, strokes, heart attacks or arrhythmias, pneumonia, etc.) and benefits of the surgical procedure were discussed. The patient states his/her understanding and agrees to proceed. @CAPHE @ agrees to a blood transfusion if necessary. A formal written consent will be obtained by the nursing staff.   H&P reviewed and patient re-examined. No changes.

## 2021-11-07 NOTE — Anesthesia Procedure Notes (Addendum)
Anesthesia Regional Block: Interscalene brachial plexus block   Pre-Anesthetic Checklist: , timeout performed,  Correct Patient, Correct Site, Correct Laterality,  Correct Procedure, Correct Position, site marked,  Risks and benefits discussed,  Surgical consent,  Pre-op evaluation,  At surgeon's request and post-op pain management  Laterality: Upper and Left  Prep: chloraprep, Dura Prep       Needles:  Injection technique: Single-shot  Needle Type: Echogenic Needle     Needle Length: 10cm  Needle Gauge: 20     Additional Needles:   Procedures:,,,, ultrasound used (permanent image in chart),,    Narrative:  End time: 11/07/2021 9:31 AM Injection made incrementally with aspirations every 5 mL.  Performed by: Personally  Anesthesiologist: Molli Barrows, MD  Additional Notes: Patient consented for risk and benefits of nerve block including but not limited to nerve damage, failed block, bleeding and infection.  Patient voiced understanding.  Functioning IV was confirmed and monitors were applied.  Timeout done prior to procedure and prior to any sedation being given to the patient.  Patient confirmed procedure site prior to any sedation given to the patient.  A Stimuplex needle was used. Sterile prep,hand hygiene and sterile gloves were used.  Minimal sedation used for procedure.  No paresthesia endorsed by patient during the procedure.  Negative aspiration and negative test dose prior to incremental administration of local anesthetic. The patient tolerated the procedure well with no immediate complications.

## 2021-11-07 NOTE — Transfer of Care (Signed)
Immediate Anesthesia Transfer of Care Note  Patient: [name] [name]  Procedure(s) Performed: SHOULDER ARTHROSCOPY WITH DEBRIDEMENT, DECOMPRESSION, ROTATOR CUFF REPAIR AND BICEPS TENODESIS. (Left: Shoulder)  Patient Location: PACU  Anesthesia Type:General  Level of Consciousness: awake, drowsy and patient cooperative  Airway & Oxygen Therapy: Patient Spontanous Breathing and Patient connected to face mask oxygen  Post-op Assessment: Report given to RN and Post -op Vital signs reviewed and stable  Post vital signs: Reviewed and stable  Last Vitals:  Vitals Value Taken Time  BP 147/80 11/07/21 1223  Temp    Pulse 71 11/07/21 1229  Resp 19 11/07/21 1229  SpO2 95 % 11/07/21 1229  Vitals shown include unvalidated device data.  Last Pain:  Vitals:   11/07/21 0840  TempSrc: Temporal  PainSc: 5          Complications: No notable events documented.

## 2021-11-07 NOTE — Anesthesia Procedure Notes (Signed)
Procedure Name: Intubation Date/Time: 11/07/2021 10:49 AM Performed by: Kelton Pillar, CRNA Pre-anesthesia Checklist: Patient identified, Emergency Drugs available, Suction available and Patient being monitored Patient Re-evaluated:Patient Re-evaluated prior to induction Oxygen Delivery Method: Circle system utilized Preoxygenation: Pre-oxygenation with 100% oxygen Induction Type: IV induction Ventilation: Mask ventilation without difficulty Laryngoscope Size: McGraph and 3 Grade View: Grade I Tube type: Oral Tube size: 7.0 mm Number of attempts: 1 Airway Equipment and Method: Stylet and Oral airway Placement Confirmation: ETT inserted through vocal cords under direct vision, positive ETCO2, breath sounds checked- equal and bilateral and CO2 detector Secured at: 21 cm Tube secured with: Tape Dental Injury: Teeth and Oropharynx as per pre-operative assessment

## 2021-11-08 ENCOUNTER — Encounter: Payer: Self-pay | Admitting: Surgery

## 2021-11-08 NOTE — Progress Notes (Signed)
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  Thank you!

## 2021-11-08 NOTE — Anesthesia Postprocedure Evaluation (Signed)
Anesthesia Post Note  Patient: [name] [name]  Procedure(s) Performed: SHOULDER ARTHROSCOPY WITH DEBRIDEMENT, DECOMPRESSION, ROTATOR CUFF REPAIR AND BICEPS TENODESIS. (Left: Shoulder)  Patient location during evaluation: PACU Anesthesia Type: General Level of consciousness: awake and alert Pain management: pain level controlled Vital Signs Assessment: post-procedure vital signs reviewed and stable Respiratory status: spontaneous breathing, nonlabored ventilation, respiratory function stable and patient connected to nasal cannula oxygen Cardiovascular status: blood pressure returned to baseline and stable Postop Assessment: no apparent nausea or vomiting Anesthetic complications: no   No notable events documented.   Last Vitals:  Vitals:   11/07/21 1300 11/07/21 1331  BP: (!) 143/75 (!) 146/73  Pulse: 70 73  Resp: 17 16  Temp: (!) 36.3 C (!) 36.3 C  SpO2: 95% 95%    Last Pain:  Vitals:   11/07/21 1331  TempSrc: Temporal  PainSc: 0-No pain                 Molli Barrows

## 2022-04-10 ENCOUNTER — Other Ambulatory Visit: Payer: Self-pay | Admitting: Neurosurgery

## 2022-04-10 DIAGNOSIS — Z981 Arthrodesis status: Secondary | ICD-10-CM

## 2022-04-10 DIAGNOSIS — M4714 Other spondylosis with myelopathy, thoracic region: Secondary | ICD-10-CM

## 2022-04-10 DIAGNOSIS — G959 Disease of spinal cord, unspecified: Secondary | ICD-10-CM

## 2022-04-29 ENCOUNTER — Ambulatory Visit: Payer: Medicare Other

## 2022-04-29 ENCOUNTER — Other Ambulatory Visit: Payer: Medicare Other

## 2022-05-05 ENCOUNTER — Ambulatory Visit
Admission: RE | Admit: 2022-05-05 | Discharge: 2022-05-05 | Disposition: A | Discharge status: Home / Self Care | Payer: Medicare Other | Source: Ambulatory Visit | Attending: Neurosurgery | Admitting: Neurosurgery

## 2022-05-05 DIAGNOSIS — G959 Disease of spinal cord, unspecified: Secondary | ICD-10-CM | POA: Diagnosis present

## 2022-05-05 DIAGNOSIS — Z981 Arthrodesis status: Secondary | ICD-10-CM

## 2022-05-05 DIAGNOSIS — M4714 Other spondylosis with myelopathy, thoracic region: Secondary | ICD-10-CM | POA: Diagnosis present

## 2022-07-04 ENCOUNTER — Observation Stay
Admission: EM | Admit: 2022-07-04 | Discharge: 2022-07-06 | Disposition: A | Discharge status: Home / Self Care | Payer: Medicare Other | Attending: Internal Medicine | Admitting: Internal Medicine

## 2022-07-04 ENCOUNTER — Emergency Department: Payer: Medicare Other

## 2022-07-04 DIAGNOSIS — R4781 Slurred speech: Secondary | ICD-10-CM | POA: Diagnosis present

## 2022-07-04 DIAGNOSIS — G4733 Obstructive sleep apnea (adult) (pediatric): Secondary | ICD-10-CM | POA: Diagnosis present

## 2022-07-04 DIAGNOSIS — Z20822 Contact with and (suspected) exposure to covid-19: Secondary | ICD-10-CM | POA: Insufficient documentation

## 2022-07-04 DIAGNOSIS — I5033 Acute on chronic diastolic (congestive) heart failure: Secondary | ICD-10-CM | POA: Insufficient documentation

## 2022-07-04 DIAGNOSIS — E782 Mixed hyperlipidemia: Secondary | ICD-10-CM | POA: Diagnosis present

## 2022-07-04 DIAGNOSIS — I110 Hypertensive heart disease with heart failure: Secondary | ICD-10-CM | POA: Insufficient documentation

## 2022-07-04 DIAGNOSIS — R1319 Other dysphagia: Secondary | ICD-10-CM | POA: Diagnosis present

## 2022-07-04 DIAGNOSIS — Z79899 Other long term (current) drug therapy: Secondary | ICD-10-CM | POA: Insufficient documentation

## 2022-07-04 DIAGNOSIS — E039 Hypothyroidism, unspecified: Secondary | ICD-10-CM | POA: Diagnosis not present

## 2022-07-04 DIAGNOSIS — I5032 Chronic diastolic (congestive) heart failure: Secondary | ICD-10-CM | POA: Diagnosis present

## 2022-07-04 DIAGNOSIS — R930 Abnormal findings on diagnostic imaging of skull and head, not elsewhere classified: Secondary | ICD-10-CM | POA: Diagnosis not present

## 2022-07-04 DIAGNOSIS — E1165 Type 2 diabetes mellitus with hyperglycemia: Secondary | ICD-10-CM | POA: Diagnosis not present

## 2022-07-04 DIAGNOSIS — E1169 Type 2 diabetes mellitus with other specified complication: Secondary | ICD-10-CM | POA: Diagnosis not present

## 2022-07-04 DIAGNOSIS — J449 Chronic obstructive pulmonary disease, unspecified: Secondary | ICD-10-CM | POA: Diagnosis present

## 2022-07-04 DIAGNOSIS — Z8546 Personal history of malignant neoplasm of prostate: Secondary | ICD-10-CM | POA: Diagnosis not present

## 2022-07-04 DIAGNOSIS — Z85858 Personal history of malignant neoplasm of other endocrine glands: Secondary | ICD-10-CM | POA: Insufficient documentation

## 2022-07-04 DIAGNOSIS — G459 Transient cerebral ischemic attack, unspecified: Principal | ICD-10-CM | POA: Diagnosis present

## 2022-07-04 DIAGNOSIS — Z7984 Long term (current) use of oral hypoglycemic drugs: Secondary | ICD-10-CM | POA: Insufficient documentation

## 2022-07-04 DIAGNOSIS — I10 Essential (primary) hypertension: Secondary | ICD-10-CM | POA: Diagnosis present

## 2022-07-04 DIAGNOSIS — Z87891 Personal history of nicotine dependence: Secondary | ICD-10-CM | POA: Insufficient documentation

## 2022-07-04 DIAGNOSIS — G959 Disease of spinal cord, unspecified: Secondary | ICD-10-CM | POA: Diagnosis present

## 2022-07-04 LAB — DIFFERENTIAL
Abs Immature Granulocytes: 0.05 10*3/uL (ref 0.00–0.07)
Basophils Absolute: 0.1 10*3/uL (ref 0.0–0.1)
Basophils Relative: 1 %
Eosinophils Absolute: 0.4 10*3/uL (ref 0.0–0.5)
Eosinophils Relative: 5 %
Immature Granulocytes: 1 %
Lymphocytes Relative: 23 %
Lymphs Abs: 1.9 10*3/uL (ref 0.7–4.0)
Monocytes Absolute: 0.7 10*3/uL (ref 0.1–1.0)
Monocytes Relative: 9 %
Neutro Abs: 5.1 10*3/uL (ref 1.7–7.7)
Neutrophils Relative %: 61 %

## 2022-07-04 LAB — APTT: aPTT: 27 seconds (ref 24–36)

## 2022-07-04 LAB — ETHANOL: Alcohol, Ethyl (B): <10 mg/dL (ref <10)

## 2022-07-04 LAB — CBC
HCT: 41.5 % (ref 39.0–52.0)
Hemoglobin: 13.3 g/dL (ref 13.0–17.0)
MCH: 28 pg (ref 26.0–34.0)
MCHC: 32 g/dL (ref 30.0–36.0)
MCV: 87.4 fL (ref 80.0–100.0)
Platelets: 214 10*3/uL (ref 150–400)
RBC: 4.75 MIL/uL (ref 4.22–5.81)
RDW: 13.1 % (ref 11.5–15.5)
WBC: 8.2 10*3/uL (ref 4.0–10.5)
nRBC: 0 % (ref 0.0–0.2)

## 2022-07-04 LAB — PROTIME-INR
INR: 1 (ref 0.8–1.2)
Prothrombin Time: 13.1 seconds (ref 11.4–15.2)

## 2022-07-04 NOTE — Progress Notes (Signed)
[facility] paged at this time  Tel[facility]roke RN

## 2022-07-04 NOTE — Progress Notes (Signed)
Dr Karle Barr on screen at this time   [facility] RN

## 2022-07-04 NOTE — ED Notes (Signed)
Activated Code Stroke w/ [facility] per Dr. Beather Arbour

## 2022-07-04 NOTE — ED Provider Notes (Signed)
[facility] Provider Note    Event Date/Time   First MD Initiated Contact with Patient 07/04/22 2318     (approximate)   History   Aphasia (Patient C/O intermittent slurred speech and states he feels like his tongue is swelling)   HPI  [name] [name] is a 76 y.o. male brought to the ED via EMS from SNF with a chief complaint of slurred speech.  Staff noted 2-3 word to slurred speech approximately 10 PM which immediately resolved.  This occurred 2-3 more episodes.  Patient states he feels like his tongue is swollen and is aware that his speech was slurred but states symptoms resolved.  Patient is wheelchair-bound with baseline neurogenic claudication; denies increased extremity weakness.  Denies vision changes, headache, neck pain, facial droop, recent fever, chest pain, shortness of breath, abdominal pain, nausea, vomiting or dizziness.  Denies anticoagulant use.     Past Medical History   Past Medical History:  Diagnosis Date   Anxiety disorder 05/05/2014   unspecifed   Arthritis    CHF (congestive heart failure) (HCC)    Colonic polyp    COPD (chronic obstructive pulmonary disease) ([place])    Depression    Diabetes mellitus without complication (HCC)    Dysphagia    Dyspnea    Elevated prostate specific antigen (PSA)    Essential (primary) hypertension 05/05/2014   GERD (gastroesophageal reflux disease)    GI bleed    due to diverticulitis    Heart murmur    hx of 1960s   Hemorrhoids    History of thyroid cancer 05/05/2014   Hypercholesteremia    Hyperlipidemia    Hypertension    Hypothyroidism 05/05/2014   Male erectile dysfunction 12/29/2012   Malignant neoplasm of prostate ([place])    10/30/2011 T1c - Identified by needle biopsy . Gleason 7 (3+4) 5 of 12 cores, Bilateral    Pneumonia    Prediabetes 06/22/2017   Sleep apnea    CPAP , report on chart , not used in last 2 weeks    Urinary obstruction    not elsewhere classified      Active Problem List   Patient Active Problem List   Diagnosis Date Noted   Esophageal dysphagia 11/18/2020   Elevated BUN 11/18/2020   AKI (acute kidney injury) ([place]) 11/18/2020   HCAP (healthcare-associated pneumonia)    Mucus plugging of bronchi    Left cervical radiculopathy 10/26/2020   Acute cervical radiculopathy 10/26/2020   Pressure injury of skin 10/26/2020   Problems with swallowing and mastication    Stricture and stenosis of esophagus    Hx of colonic polyps    Polyp of ascending colon    Bilateral carpal tunnel syndrome 02/13/2020   Neurocardiogenic syncope 03/02/2019   Lymphedema 02/04/2019   Tobacco use 02/03/2019   CHF (congestive heart failure) ([place]) 12/23/2018   Acute on chronic diastolic CHF (congestive heart failure) ([place]) 11/27/2018   Cellulitis 11/27/2018   Hypothyroidism 11/27/2018   OSA (obstructive sleep apnea) 11/27/2018   HTN (hypertension) 11/27/2018   HLD (hyperlipidemia) 11/27/2018   Protein-calorie malnutrition ([place]) 03/02/2018   Neuropathy 03/02/2018   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   Difficulty in voiding 03/01/2018   Vitamin D deficiency 03/01/2018   Vitamin B12 deficiency 03/01/2018   Insomnia 03/01/2018   Acute exacerbation of chronic obstructive pulmonary disease (COPD) ([place]) 02/11/2018   Pain due to onychomycosis of nail 02/11/2018   Onychomycosis 02/11/2018   Traumatic ecchymosis of foot, right, initial encounter  02/11/2018   Xerostomia 02/11/2018   Frequent falls 02/11/2018   Acute respiratory failure ([place]) 02/07/2018   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     Past Surgical History   Past Surgical History:  Procedure Laterality Date   BACK SURGERY     lumbar 1987    BACK SURGERY     C-6- Plate&Pin   CATARACT EXTRACTION W/PHACO Right 09/15/2018   Procedure: CATARACT EXTRACTION PHACO AND INTRAOCULAR LENS PLACEMENT ([place]) RIGHT;  Surgeon: Leandrew Koyanagi, MD;  Location: [facility];   Service: Ophthalmology;  Laterality: Right;  Requests to be last case   CATARACT EXTRACTION W/PHACO Left 10/06/2018   Procedure: CATARACT EXTRACTION PHACO AND INTRAOCULAR LENS PLACEMENT ([place])  LEFT;  Surgeon: Leandrew Koyanagi, MD;  Location: [place];  Service: Ophthalmology;  Laterality: Left;   CERVICAL DISC SURGERY     COLONOSCOPY     10/06/2011, 06/05/2006, 05/06/2002 adematous polyps: CBF 09/2014; Recall Ltr mailed 02/27/2015 (dw)   COLONOSCOPY WITH PROPOFOL N/A 07/06/2015   Procedure: COLONOSCOPY WITH PROPOFOL;  Surgeon: Manya Silvas, MD;  Location: [facility] ENDOSCOPY;  Service: Endoscopy;  Laterality: N/A;   COLONOSCOPY WITH PROPOFOL N/A 09/01/2018   Procedure: COLONOSCOPY WITH PROPOFOL;  Surgeon: Manya Silvas, MD;  Location: [facility] ENDOSCOPY;  Service: Endoscopy;  Laterality: N/A;   COLONOSCOPY WITH PROPOFOL N/A 04/17/2020   Procedure: COLONOSCOPY WITH PROPOFOL;  Surgeon: Lucilla Lame, MD;  Location: [facility] ENDOSCOPY;  Service: Endoscopy;  Laterality: N/A;   ESOPHAGOGASTRODUODENOSCOPY (EGD) WITH PROPOFOL N/A 04/17/2020   Procedure: ESOPHAGOGASTRODUODENOSCOPY (EGD) WITH PROPOFOL;  Surgeon: Lucilla Lame, MD;  Location: [facility] ENDOSCOPY;  Service: Endoscopy;  Laterality: N/A;   EYE SURGERY     HERNIA REPAIR     umbilical hernia repair    LAMINECTOMY FOR EXCISION / EVACUATION INTRASPINAL LESION     lumbar   OTHER SURGICAL HISTORY     right rotator cuff surgery    OTHER SURGICAL HISTORY     bilateral tubes in ears    POSTERIOR CERVICAL LAMINECTOMY N/A 10/27/2020   Procedure: POSTERIOR CERVICAL LAMINECTOMY CERVICAL THREE-CERVICAL SIX;  Surgeon: Judith Part, MD;  Location: [facility];  Service: Neurosurgery;  Laterality: N/A;   POSTERIOR LAMINECTOMY / DECOMPRESSION CERVICAL SPINE     PROSTATE BIOPSY     10/22/2011 Volume:55.8 cc's, PSA:4.5, Free PSA:12%   ROBOT ASSISTED LAPAROSCOPIC RADICAL PROSTATECTOMY  12/15/2011   Procedure: ROBOTIC ASSISTED LAPAROSCOPIC RADICAL  PROSTATECTOMY LEVEL 2;  Surgeon: Dutch Gray, MD;  Location: [facility];  Service: Urology;  Laterality: N/A;       ROTATOR CUFF REPAIR Right    SHOULDER ARTHROSCOPY WITH SUBACROMIAL DECOMPRESSION, ROTATOR CUFF REPAIR AND BICEP TENDON REPAIR Left 11/07/2021   Procedure: SHOULDER ARTHROSCOPY WITH DEBRIDEMENT, DECOMPRESSION, ROTATOR CUFF REPAIR AND BICEPS TENODESIS.;  Surgeon: Corky Mull, MD;  Location: [facility];  Service: Orthopedics;  Laterality: Left;   TONSILLECTOMY     TOTAL THYROIDECTOMY       Home Medications   Prior to Admission medications   Medication Sig Start Date End Date Taking? Authorizing Provider  albuterol (VENTOLIN HFA) 108 (90 Base) MCG/ACT inhaler Inhale 2 puffs into the lungs every 6 (six) hours as needed for wheezing or shortness of breath.    [provider]  atorvastatin (LIPITOR) 40 MG tablet Take 1 tablet (40 mg total) by mouth daily. 11/29/20 11/04/21  Ashok Pall, MD  Benzocaine-Menthol 15-3.6 MG LOZG Use as directed 1 lozenge in the mouth or throat every 4 (four) hours as needed (sore  throat).    [provider]  bisacodyl (DULCOLAX) 10 MG suppository Place 10 mg rectally daily as needed for moderate constipation.    [provider]  dicyclomine (BENTYL) 10 MG capsule Take 10 mg by mouth 3 (three) times daily as needed (abdominal pain).    [provider]  donepezil (ARICEPT) 10 MG tablet Take 10 mg by mouth at bedtime. 01/04/21   [provider]  ergocalciferol (VITAMIN D2) 1.25 MG (50000 UT) capsule Take 50,000 Units by mouth every Wednesday.    [provider]  esomeprazole (NEXIUM) 40 MG capsule Take 40 mg by mouth daily.    [provider]  FLUoxetine (PROZAC) 40 MG capsule Take 40 mg by mouth daily.    [provider]  fluticasone-salmeterol (ADVAIR) 250-50 MCG/ACT AEPB Inhale 1 puff into the lungs in the morning and at bedtime.    [provider]  furosemide (LASIX) 40 MG tablet  Take 40 mg by mouth 2 (two) times daily.    [provider]  hydrocortisone 2.5 % cream Apply 1 application topically every 12 (twelve) hours as needed (per rectum, hemorrhoids).    [provider]  isosorbide dinitrate (ISORDIL) 10 MG tablet Take 1 tablet (10 mg total) by mouth 3 (three) times daily. 11/28/20 11/04/21  Ashok Pall, MD  levothyroxine (SYNTHROID) 200 MCG tablet Take 200 mcg by mouth daily before breakfast.    [provider]  magnesium oxide (MAG-OX) 400 MG tablet Take 400 mg by mouth 2 (two) times daily. 01/25/21   [provider]  melatonin 5 MG TABS Take 5 mg by mouth at bedtime.    [provider]  metFORMIN (GLUCOPHAGE) 500 MG tablet Take 500 mg by mouth daily. 05/24/20   [provider]  midodrine (PROAMATINE) 5 MG tablet Take 5 mg by mouth daily.    [provider]  oxyCODONE (OXY IR/ROXICODONE) 5 MG immediate release tablet Take 1-2 tablets (5-10 mg total) by mouth every 4 (four) hours as needed for severe pain or moderate pain. 11/07/21   Poggi, Marshall Cork, MD  potassium chloride SA (K-DUR,KLOR-CON) 20 MEQ tablet Take 20 mEq by mouth 3 (three) times daily with meals.    [provider]  pregabalin (LYRICA) 150 MG capsule Take 150 mg by mouth at bedtime.    [provider]  senna-docusate (SENOKOT-S) 8.6-50 MG tablet Take 1 tablet by mouth 2 (two) times daily.    [provider]  simethicone (MYLICON) 841 MG chewable tablet Chew 125 mg by mouth 2 (two) times daily.    [provider]  traZODone (DESYREL) 100 MG tablet Take 100 mg by mouth at bedtime.  02/04/20   [provider]  traZODone (DESYREL) 50 MG tablet Take 25 mg by mouth daily.    [provider]     Allergies  Ibuprofen   Family History   Family History  Problem Relation Age of Onset   Hypertension Mother    Anxiety disorder Mother    Stroke Mother        "mini strokes"   Coronary artery  disease Mother    Heart attack Mother    Emphysema Father    Heart disease Father    Scoliosis Father    Coronary artery disease Father    GU problems Neg Hx    Kidney disease Neg Hx    Prostate cancer Neg Hx      Physical Exam  Triage Vital Signs: ED Triage Vitals  Enc Vitals Group     BP 07/04/22 2326 (!) 161/77     Pulse Rate 07/04/22 2326 (!) 57     Resp 07/04/22 2326 11     Temp --      Temp src --      SpO2 07/04/22 2326 96 %     Weight 07/04/22 2325 289 lb 1.6 oz (131.1 kg)     Height --      Head Circumference --      Peak Flow --      Pain Score 07/04/22 2325 0     Pain Loc --      Pain Edu? --      Excl. in [place]? --     Updated Vital Signs: BP (!) 150/70   Pulse (!) 56   Resp 11   Wt 131.1 kg   SpO2 97%   BMI 36.62 kg/m    General: Awake, no distress.  CV:  Bradycardiac.  Good peripheral perfusion.  Resp:  Normal effort.  CTA B. Abd:  Nontender.  No distention.  Other:  No angioedema noted.  Phonation intact.  There is no muffled speech or drooling.  Tolerating secretions well.  Alert and oriented x3.  Mild left facial droop noted; otherwise CN II-XII grossly intact.  BUE with symmetrical motor strength and sensation.  BLE with symmetrical motor strength and sensation.   ED Results / Procedures / Treatments  Labs (all labs ordered are listed, but only abnormal results are displayed) Labs Reviewed  RESP PANEL BY RT-PCR (FLU A&B, COVID) ARPGX2  ETHANOL  PROTIME-INR  APTT  CBC  DIFFERENTIAL  COMPREHENSIVE METABOLIC PANEL  URINE DRUG SCREEN, QUALITATIVE ([facility] ONLY)  URINALYSIS, ROUTINE W REFLEX MICROSCOPIC  TROPONIN I (HIGH SENSITIVITY)     EKG  ED ECG REPORT I, Markese Bloxham J, the attending physician, personally viewed and interpreted this ECG.   Date: 07/05/2022  EKG Time: 2350  Rate: 55  Rhythm: sinus bradycardia  Axis: LAD  Intervals:left anterior fascicular block  ST&T Change: Nonspecific    RADIOLOGY I have independently  visualized and interpreted patient's CT and chest x-ray as well as noted the radiology interpretation:  CT head: No ICH  Chest x-ray:  Official radiology report(s): CT HEAD CODE STROKE WO CONTRAST  Result Date: 07/04/2022 CLINICAL DATA:  Code stroke.  Slurred speech, numbness EXAM: CT HEAD WITHOUT CONTRAST TECHNIQUE: Contiguous axial images were obtained from the base of the skull through the vertex without intravenous contrast. RADIATION DOSE REDUCTION: This exam was performed according to the departmental dose-optimization program which includes automated exposure control, adjustment of the mA and/or kV according to patient size and/or use of iterative reconstruction technique. COMPARISON:  09/05/2019 FINDINGS: Brain: No evidence of acute infarction, hemorrhage, cerebral edema, mass, mass effect, or midline shift. No hydrocephalus or extra-axial collection. Vascular: No hyperdense vessel. Skull: Negative for fracture or focal lesion. Sinuses/Orbits: Mucosal thickening and bubbly fluid in the ethmoid air cells. Air-fluid level and bubbly fluid in the left sphenoid sinus. Mild mucosal thickening in the maxillary sinuses. Other: Fluid in the bilateral mastoid air cells. ASPECTS [facility] Stroke Program Early CT Score) - Ganglionic level infarction (caudate, lentiform nuclei, internal capsule, insula, M1-M3 cortex): 7 - Supraganglionic infarction (M4-M6 cortex): 3 Total score (0-10 with 10 being normal): 10 IMPRESSION: 1. No acute intracranial process. 2. ASPECTS is 10 3. Air-fluid level with bubbly fluid in the ethmoid air cells and left sphenoid sinus, as can be seen with acute sinusitis. Correlate with  symptoms. Code stroke imaging results were communicated on 07/04/2022 at 11:47 pm to provider Varick Keys via telephone, who verbally acknowledged these results. Electronically Signed   By: Merilyn Baba M.D.   On: 07/04/2022 23:50     PROCEDURES:  Critical Care performed: Yes, see critical care procedure  note(s)  CRITICAL CARE Performed by: Paulette Blanch   Total critical care time: 45 minutes  Critical care time was exclusive of separately billable procedures and treating other patients.  Critical care was necessary to treat or prevent imminent or life-threatening deterioration.  Critical care was time spent personally by me on the following activities: development of treatment plan with patient and/or surrogate as well as nursing, discussions with consultants, evaluation of patient's response to treatment, examination of patient, obtaining history from patient or surrogate, ordering and performing treatments and interventions, ordering and review of laboratory studies, ordering and review of radiographic studies, pulse oximetry and re-evaluation of patient's condition.  NIH Stroke Scale  Interval: Baseline Time: 12:16 AM Person Administering Scale: Jannie Doyle J  Administer stroke scale items in the order listed. Record performance in each category after each subscale exam. Do not go back and change scores. Follow directions provided for each exam technique. Scores should reflect what the patient does, not what the clinician thinks the patient can do. The clinician should record answers while administering the exam and work quickly. Except where indicated, the patient should not be coached (i.e., repeated requests to patient to make a special effort).   1a  Level of consciousness: 0=alert; keenly responsive  1b. LOC questions:  0=Performs both tasks correctly  1c. LOC commands: 0=Performs both tasks correctly  2.  Best Gaze: 0=normal  3.  Visual: 0=No visual loss  4. Facial Palsy: 1=Minor paralysis (flattened nasolabial fold, asymmetric on smiling)  5a.  Motor left arm: 0=No drift, limb holds 90 (or 45) degrees for full 10 seconds  5b.  Motor right arm: 0=No drift, limb holds 90 (or 45) degrees for full 10 seconds  6a. motor left leg: 0=No drift, limb holds 90 (or 45) degrees for full 10  seconds  6b  Motor right leg:  0=No drift, limb holds 90 (or 45) degrees for full 10 seconds  7. Limb Ataxia: 0=Absent  8.  Sensory: 0=Normal; no sensory loss  9. Best Language:  0=No aphasia, normal  10. Dysarthria: 0=Normal  11. Extinction and Inattention: 0=No abnormality  12. Distal motor function: 0=Normal   Total:   1      .1-3 Lead EKG Interpretation  Performed by: Paulette Blanch, MD Authorized by: Paulette Blanch, MD     Interpretation: abnormal     ECG rate:  55   ECG rate assessment: bradycardic     Rhythm: sinus bradycardia     Ectopy: none     Conduction: normal   Comments:     Patient placed on cardiac monitor to evaluate for arrhythmias    MEDICATIONS ORDERED IN ED: Medications - No data to display   IMPRESSION / MDM / [place] / ED COURSE  I reviewed the triage vital signs and the nursing notes.                             76 year old male brought to the ED from SNF for slurred speech; symptoms resolved prior to arrival.  Differential diagnosis includes but is not limited to TIA, CVA, ICH, ACS, angioedema, etc.  Patient's presentation  is most consistent with acute presentation with potential threat to life or bodily function.  The patient is on the cardiac monitor to evaluate for evidence of arrhythmia and/or significant heart rate changes.  Patient with stuttering episodes of slurred speech onset 10 PM with left-sided facial droop.  ED code stroke initiated.  Clinical Course as of 07/05/22 0016  Sat Jul 05, 2022  0011 Appreciate teleneurology consult; recommend admission for TIA work-up.  Recommend starting aspirin; however, patient experiences angioedema to ibuprofen, will hold for now. [JS]    Clinical Course User Index [JS] Paulette Blanch, MD     FINAL CLINICAL IMPRESSION(S) / ED DIAGNOSES   Final diagnoses:  TIA (transient ischemic attack)     Rx / DC Orders   ED Discharge Orders     None        Note:  This document was  prepared using Dragon voice recognition software and may include unintentional dictation errors.   Paulette Blanch, MD 07/05/22 [phone]

## 2022-07-04 NOTE — Progress Notes (Signed)
Stroke cart activated at this time, per RN, pt with intermittent slurred speech with onset at 2200 this evening, pt had already been to CT upon stroke activation.    Telestroke Therapist, sports

## 2022-07-05 ENCOUNTER — Observation Stay
Admit: 2022-07-05 | Discharge: 2022-07-05 | Disposition: A | Discharge status: Home / Self Care | Payer: Medicare Other | Attending: Internal Medicine | Admitting: Internal Medicine

## 2022-07-05 ENCOUNTER — Observation Stay: Payer: Medicare Other

## 2022-07-05 ENCOUNTER — Encounter: Payer: Self-pay | Admitting: Radiology

## 2022-07-05 DIAGNOSIS — E1165 Type 2 diabetes mellitus with hyperglycemia: Secondary | ICD-10-CM | POA: Diagnosis present

## 2022-07-05 DIAGNOSIS — G959 Disease of spinal cord, unspecified: Secondary | ICD-10-CM | POA: Diagnosis present

## 2022-07-05 DIAGNOSIS — R930 Abnormal findings on diagnostic imaging of skull and head, not elsewhere classified: Secondary | ICD-10-CM | POA: Diagnosis present

## 2022-07-05 DIAGNOSIS — G459 Transient cerebral ischemic attack, unspecified: Secondary | ICD-10-CM

## 2022-07-05 LAB — COMPREHENSIVE METABOLIC PANEL
ALT: 25 U/L (ref 0–44)
AST: 22 U/L (ref 15–41)
Albumin: 3.4 g/dL — ABNORMAL LOW (ref 3.5–5.0)
Alkaline Phosphatase: 81 U/L (ref 38–126)
Anion gap: 9 (ref 5–15)
BUN: 18 mg/dL (ref 8–23)
CO2: 25 mmol/L (ref 22–32)
Calcium: 8.9 mg/dL (ref 8.9–10.3)
Chloride: 104 mmol/L (ref 98–111)
Creatinine, Ser: 1.03 mg/dL (ref 0.61–1.24)
GFR, Estimated: >60 mL/min (ref >60)
Glucose, Bld: 213 mg/dL — ABNORMAL HIGH (ref 70–99)
Potassium: 3.6 mmol/L (ref 3.5–5.1)
Sodium: 138 mmol/L (ref 135–145)
Total Bilirubin: 0.7 mg/dL (ref 0.3–1.2)
Total Protein: 6.3 g/dL — ABNORMAL LOW (ref 6.5–8.1)

## 2022-07-05 LAB — URINALYSIS, ROUTINE W REFLEX MICROSCOPIC
Bilirubin Urine: NEGATIVE
Glucose, UA: NEGATIVE mg/dL
Hgb urine dipstick: NEGATIVE
Ketones, ur: NEGATIVE mg/dL
Leukocytes,Ua: NEGATIVE
Nitrite: NEGATIVE
Protein, ur: NEGATIVE mg/dL
Specific Gravity, Urine: 1.01 (ref 1.005–1.030)
pH: 5 (ref 5.0–8.0)

## 2022-07-05 LAB — ECHOCARDIOGRAM COMPLETE
AR max vel: 2.92 cm2
AV Peak grad: 10.8 mmHg
Ao pk vel: 1.64 m/s
Area-P 1/2: 2.22 cm2
Calc EF: 57 %
Height: 74.016 in
S' Lateral: 3.78 cm
Single Plane A2C EF: 61.4 %
Single Plane A4C EF: 56.7 %
Weight: 4625.6 oz

## 2022-07-05 LAB — URINE DRUG SCREEN, QUALITATIVE (ARMC ONLY)
Amphetamines, Ur Screen: NOT DETECTED
Barbiturates, Ur Screen: NOT DETECTED
Benzodiazepine, Ur Scrn: NOT DETECTED
Cannabinoid 50 Ng, Ur ~~LOC~~: NOT DETECTED
Cocaine Metabolite,Ur ~~LOC~~: NOT DETECTED
MDMA (Ecstasy)Ur Screen: NOT DETECTED
Methadone Scn, Ur: NOT DETECTED
Opiate, Ur Screen: POSITIVE — AB
Phencyclidine (PCP) Ur S: NOT DETECTED
Tricyclic, Ur Screen: NOT DETECTED

## 2022-07-05 LAB — CBG MONITORING, ED
Glucose-Capillary: 189 mg/dL — ABNORMAL HIGH (ref 70–99)
Glucose-Capillary: 223 mg/dL — ABNORMAL HIGH (ref 70–99)

## 2022-07-05 LAB — LIPID PANEL
Cholesterol: 133 mg/dL (ref 0–200)
HDL: 32 mg/dL — ABNORMAL LOW (ref >40)
LDL Cholesterol: 58 mg/dL (ref 0–99)
Total CHOL/HDL Ratio: 4.2 RATIO
Triglycerides: 215 mg/dL — ABNORMAL HIGH (ref <150)
VLDL: 43 mg/dL — ABNORMAL HIGH (ref 0–40)

## 2022-07-05 LAB — RESP PANEL BY RT-PCR (FLU A&B, COVID) ARPGX2
Influenza A by PCR: NEGATIVE
Influenza B by PCR: NEGATIVE
SARS Coronavirus 2 by RT PCR: NEGATIVE

## 2022-07-05 LAB — HEMOGLOBIN A1C
Hgb A1c MFr Bld: 10.8 % — ABNORMAL HIGH (ref 4.8–5.6)
Mean Plasma Glucose: 263.26 mg/dL

## 2022-07-05 LAB — GLUCOSE, CAPILLARY
Glucose-Capillary: 184 mg/dL — ABNORMAL HIGH (ref 70–99)
Glucose-Capillary: 188 mg/dL — ABNORMAL HIGH (ref 70–99)

## 2022-07-05 LAB — TROPONIN I (HIGH SENSITIVITY)
Troponin I (High Sensitivity): 8 ng/L (ref <18)
Troponin I (High Sensitivity): 8 ng/L (ref <18)

## 2022-07-05 MED ORDER — CLOPIDOGREL BISULFATE 75 MG PO TABS
75.0000 mg | ORAL_TABLET | Freq: Every day | ORAL | Status: DC
  Administered 2022-07-06: 75 mg via ORAL
  Filled 2022-07-05: qty 1

## 2022-07-05 MED ORDER — DULOXETINE HCL 30 MG PO CPEP
30.0000 mg | ORAL_CAPSULE | Freq: Every day | ORAL | Status: DC
  Administered 2022-07-05 – 2022-07-06 (×2): 30 mg via ORAL
  Filled 2022-07-05 (×3): qty 1

## 2022-07-05 MED ORDER — ACETAMINOPHEN 650 MG RE SUPP: 650.0000 mg | RECTAL | Status: DC | PRN

## 2022-07-05 MED ORDER — MOMETASONE FURO-FORMOTEROL FUM 200-5 MCG/ACT IN AERO
2.0000 | INHALATION_SPRAY | Freq: Two times a day (BID) | RESPIRATORY_TRACT | Status: DC
  Filled 2022-07-05: qty 8.8

## 2022-07-05 MED ORDER — INSULIN ASPART 100 UNIT/ML IJ SOLN: 0.0000 [IU] | Freq: Every day | INTRAMUSCULAR | Status: DC

## 2022-07-05 MED ORDER — CLOPIDOGREL BISULFATE 75 MG PO TABS: 75.0000 mg | ORAL_TABLET | Freq: Every day | ORAL | Status: DC

## 2022-07-05 MED ORDER — FLUOXETINE HCL 20 MG PO CAPS
40.0000 mg | ORAL_CAPSULE | Freq: Every day | ORAL | Status: DC
  Administered 2022-07-05 – 2022-07-06 (×2): 40 mg via ORAL
  Filled 2022-07-05 (×2): qty 2

## 2022-07-05 MED ORDER — MELATONIN 5 MG PO TABS
5.0000 mg | ORAL_TABLET | Freq: Every day | ORAL | Status: DC
  Administered 2022-07-05: 5 mg via ORAL
  Filled 2022-07-05: qty 1

## 2022-07-05 MED ORDER — ENOXAPARIN SODIUM 80 MG/0.8ML IJ SOSY
0.5000 mg/kg | PREFILLED_SYRINGE | INTRAMUSCULAR | Status: DC
Start: 2022-07-05 — End: 2022-07-06
  Administered 2022-07-05: 65 mg via SUBCUTANEOUS
  Filled 2022-07-05: qty 0.65

## 2022-07-05 MED ORDER — PANTOPRAZOLE SODIUM 40 MG PO TBEC
40.0000 mg | DELAYED_RELEASE_TABLET | Freq: Every day | ORAL | Status: DC
Start: 2022-07-05 — End: 2022-07-06
  Administered 2022-07-05 – 2022-07-06 (×2): 40 mg via ORAL
  Filled 2022-07-05 (×2): qty 1

## 2022-07-05 MED ORDER — ACETAMINOPHEN 160 MG/5ML PO SOLN: 650.0000 mg | ORAL | Status: DC | PRN

## 2022-07-05 MED ORDER — GEMFIBROZIL 600 MG PO TABS
600.0000 mg | ORAL_TABLET | Freq: Two times a day (BID) | ORAL | Status: DC
  Filled 2022-07-05: qty 1

## 2022-07-05 MED ORDER — ATORVASTATIN CALCIUM 20 MG PO TABS
40.0000 mg | ORAL_TABLET | Freq: Every day | ORAL | Status: DC
  Administered 2022-07-05 – 2022-07-06 (×2): 40 mg via ORAL
  Filled 2022-07-05 (×2): qty 2

## 2022-07-05 MED ORDER — CLOPIDOGREL BISULFATE 75 MG PO TABS
300.0000 mg | ORAL_TABLET | Freq: Once | ORAL | Status: AC
  Administered 2022-07-05: 300 mg via ORAL
  Filled 2022-07-05: qty 4

## 2022-07-05 MED ORDER — EZETIMIBE 10 MG PO TABS
10.0000 mg | ORAL_TABLET | Freq: Every day | ORAL | Status: DC
  Administered 2022-07-06: 10 mg via ORAL
  Filled 2022-07-05: qty 1

## 2022-07-05 MED ORDER — ISOSORBIDE DINITRATE 10 MG PO TABS
10.0000 mg | ORAL_TABLET | Freq: Three times a day (TID) | ORAL | Status: DC
  Administered 2022-07-05 – 2022-07-06 (×3): 10 mg via ORAL
  Filled 2022-07-05 (×5): qty 1

## 2022-07-05 MED ORDER — ORAL CARE MOUTH RINSE: 15.0000 mL | OROMUCOSAL | Status: DC | PRN

## 2022-07-05 MED ORDER — STROKE: EARLY STAGES OF RECOVERY BOOK: Freq: Once | Status: AC

## 2022-07-05 MED ORDER — ACETAMINOPHEN 325 MG PO TABS: 650.0000 mg | ORAL_TABLET | ORAL | Status: DC | PRN

## 2022-07-05 MED ORDER — PREGABALIN 75 MG PO CAPS
150.0000 mg | ORAL_CAPSULE | Freq: Two times a day (BID) | ORAL | Status: DC
  Administered 2022-07-05 – 2022-07-06 (×2): 150 mg via ORAL
  Filled 2022-07-05 (×2): qty 2

## 2022-07-05 MED ORDER — MAGNESIUM OXIDE 400 MG PO TABS
400.0000 mg | ORAL_TABLET | Freq: Two times a day (BID) | ORAL | Status: DC
  Administered 2022-07-05 – 2022-07-06 (×2): 400 mg via ORAL
  Filled 2022-07-05 (×4): qty 1

## 2022-07-05 MED ORDER — INSULIN ASPART 100 UNIT/ML IJ SOLN
0.0000 [IU] | Freq: Three times a day (TID) | INTRAMUSCULAR | Status: DC
  Administered 2022-07-05: 4 [IU] via SUBCUTANEOUS
  Administered 2022-07-05: 7 [IU] via SUBCUTANEOUS
  Administered 2022-07-05: 5 [IU] via SUBCUTANEOUS
  Administered 2022-07-06: 7 [IU] via SUBCUTANEOUS
  Administered 2022-07-06: 4 [IU] via SUBCUTANEOUS
  Filled 2022-07-05 (×5): qty 1

## 2022-07-05 MED ORDER — OXYCODONE HCL 5 MG PO TABS
5.0000 mg | ORAL_TABLET | ORAL | Status: DC | PRN
  Administered 2022-07-05: 5 mg via ORAL
  Filled 2022-07-05: qty 1

## 2022-07-05 MED ORDER — LEVOTHYROXINE SODIUM 100 MCG PO TABS
200.0000 ug | ORAL_TABLET | Freq: Every day | ORAL | Status: DC
  Administered 2022-07-05 – 2022-07-06 (×2): 200 ug via ORAL
  Filled 2022-07-05: qty 2
  Filled 2022-07-05: qty 4

## 2022-07-05 MED ORDER — DIPHENHYDRAMINE HCL 25 MG PO CAPS
25.0000 mg | ORAL_CAPSULE | Freq: Four times a day (QID) | ORAL | Status: DC | PRN
  Administered 2022-07-05 – 2022-07-06 (×2): 25 mg via ORAL
  Filled 2022-07-05 (×2): qty 1

## 2022-07-05 MED ORDER — DONEPEZIL HCL 5 MG PO TABS
10.0000 mg | ORAL_TABLET | Freq: Every day | ORAL | Status: DC
  Administered 2022-07-05: 10 mg via ORAL
  Filled 2022-07-05: qty 2

## 2022-07-05 NOTE — Assessment & Plan Note (Addendum)
History of esophageal stricture with dilatation in 2021 Currently asymptomatic

## 2022-07-05 NOTE — Progress Notes (Signed)
*  PRELIMINARY RESULTS* Echocardiogram 2D Echocardiogram has been performed.  Claretta Fraise 07/05/2022, 10:00 AM

## 2022-07-05 NOTE — Plan of Care (Signed)
  Problem: Education: Goal: Knowledge of General Education information will improve Description: Including pain rating scale, medication(s)/side effects and non-pharmacologic comfort measures Outcome: Progressing Note: [place] Patient Guide (book)

## 2022-07-05 NOTE — Evaluation (Signed)
Physical Therapy Evaluation Patient Details Name: [name] [name] MRN: [id number] DOB: 1946/09/18 Today's Date: 07/05/2022  History of Present Illness  Pt is a 76 year old male admitted with slurred speach, code stroke activiated. 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 Imaging negative for CVA and physical test appears grossly close to baseline level per subjective report. Pt performs bed mobility/transfers with min assist and HHA and is unable to ambulate at this time. Pt demonstrates deficits with strength (L=R), mobility, pain (B feet). Pt is close to baseline level, but would like to continue to work with PT at SNF to meet goals of ambulation. Would benefit from skilled PT to address above deficits and promote optimal return to PLOF; recommend transition to STR upon discharge from acute hospitalization.      Recommendations for follow up therapy are one component of a multi-disciplinary discharge planning process, led by the attending physician.  Recommendations may be updated based on patient status, additional functional criteria and insurance authorization.  Follow Up Recommendations Skilled nursing-short term rehab (<3 hours/day) Can patient physically be transported by private vehicle: No    Assistance Recommended at Discharge Frequent or constant Supervision/Assistance  Patient can return home with the following  A lot of help with walking and/or transfers;A lot of help with bathing/dressing/bathroom;Help with stairs or ramp for entrance    Equipment Recommendations  (TBD)  Recommendations for Other Services       Functional Status Assessment Patient has had a recent decline in their functional status and demonstrates the ability to make significant  improvements in function in a reasonable and predictable amount of time.     Precautions / Restrictions Precautions Precautions: Fall Restrictions Weight Bearing Restrictions: No      Mobility  Bed Mobility Overal bed mobility: Needs Assistance Bed Mobility: Supine to Sit, Sit to Supine     Supine to sit: HOB elevated, Min assist Sit to supine: Min assist   General bed mobility comments: increased time and pt becomes fatigued quickly. Once seated at EOB, upright posture noted    Transfers Overall transfer level: Needs assistance Equipment used: 1 person hand held assist Transfers: Sit to/from Stand Sit to Stand: Min assist, From elevated surface           General transfer comment: significantly elevated surface per pt request, reports similar transfer at facility. HHA given with flexed posture once standing. once returned to seated position able to lateral scoot up towards [place].    Ambulation/Gait               General Gait Details: unable  Stairs            Wheelchair Mobility    Modified Rankin (Stroke Patients Only)       Balance Overall balance assessment: Needs assistance Sitting-balance support: Feet supported Sitting balance-Leahy Scale: Good     Standing balance support: Bilateral upper extremity supported Standing balance-Leahy Scale: Poor                               Pertinent Vitals/Pain Pain Assessment Pain Assessment: Faces Faces Pain Scale: Hurts little more Pain Location: B feet with light touch and scotrum Pain Descriptors / Indicators: Stabbing, Discomfort Pain Intervention(s): Limited activity within  patient's tolerance    Home Living Family/patient expects to be discharged to:: Skilled nursing facility   Available Help at Discharge: [place] Type of Home: [place]             Additional Comments: Pt reports he lives at [facility], still getting rehab, per pt  eventually wants to return home    Prior Function Prior Level of Function : Needs assist             Mobility Comments: pt reports he SPT to mwc from bed with supervision from staff, mobility via wheelchair level primarily ADLs Comments: assist for LB dressing, bathing, toileting; Assist for IADLs     Hand Dominance   Dominant Hand: Right    Extremity/Trunk Assessment   Upper Extremity Assessment Upper Extremity Assessment: Defer to OT evaluation    Lower Extremity Assessment Lower Extremity Assessment: Generalized weakness (B LE grossly 3+/5) RLE Sensation: history of peripheral neuropathy (distal to midle of shin) RLE Coordination: decreased gross motor LLE Sensation: history of peripheral neuropathy (distal to middle of shin) LLE Coordination: decreased gross motor       Communication   Communication: No difficulties  Cognition Arousal/Alertness: Awake/alert Behavior During Therapy: WFL for tasks assessed/performed Overall Cognitive Status: Within Functional Limits for tasks assessed                                          General Comments General comments (skin integrity, edema, etc.): vital signs appear stable throughout    Exercises Other Exercises Other Exercises: pt noted to be soiled as he had voided on himself. Assisted with changing bed linens and CNA replaced purewick system   Assessment/Plan    PT Assessment Patient needs continued PT services  PT Problem List Decreased strength;Decreased activity tolerance;Decreased balance;Decreased mobility;Obesity;Pain       PT Treatment Interventions Gait training;DME instruction;Therapeutic exercise;Therapeutic activities;Functional mobility training    PT Goals (Current goals can be found in the Care Plan section)  Acute Rehab PT Goals Patient Stated Goal: to eventually go home PT Goal Formulation: With patient Time For Goal Achievement: 07/19/22 Potential to Achieve Goals: Good     Frequency Min 2X/week     Co-evaluation               AM-PAC PT "6 Clicks" Mobility  Outcome Measure Help needed turning from your back to your side while in a flat bed without using bedrails?: None Help needed moving from lying on your back to sitting on the side of a flat bed without using bedrails?: None Help needed moving to and from a bed to a chair (including a wheelchair)?: A Lot Help needed standing up from a chair using your arms (e.g., wheelchair or bedside chair)?: A Little Help needed to walk in hospital room?: A Lot Help needed climbing 3-5 steps with a railing? : Total 6 Click Score: 16    End of Session   Activity Tolerance: Patient tolerated treatment well Patient left: in bed;with nursing/sitter in room Nurse Communication: Mobility status PT Visit Diagnosis: Muscle weakness (generalized) (M62.81);Unsteadiness on feet (R26.81);Difficulty in walking, not elsewhere classified (R26.2)    Time: 3220-2542 PT Time Calculation (min) (ACUTE ONLY): 17 min   Charges:   PT Evaluation $PT Eval Low Complexity: 1 Low PT Treatments $Therapeutic Activity: 8-22 mins        Greggory Stallion,  PT, DPT, GCS [phone]   Victorya Hillman 07/05/2022, 3:04 PM

## 2022-07-05 NOTE — Plan of Care (Signed)
  Problem: Education: Goal: Knowledge of General Education information will improve Description: Including pain rating scale, medication(s)/side effects and non-pharmacologic comfort measures Outcome: Progressing Note: [place] Patient Guide (book)   Problem: Pain Managment: Goal: General experience of comfort will improve Outcome: Progressing   Problem: Safety: Goal: Ability to remain free from injury will improve Outcome: Progressing   Problem: Education: Goal: Knowledge of disease or condition will improve Outcome: Progressing Note: Stroke book given to pt Goal: Knowledge of secondary prevention will improve (SELECT ALL) Outcome: Progressing Goal: Knowledge of patient specific risk factors will improve (INDIVIDUALIZE FOR PATIENT) Outcome: Progressing Note: Diabetes, obesity   Problem: Coping: Goal: Will verbalize positive feelings about self Outcome: Progressing Goal: Will identify appropriate support needs Outcome: Progressing   Problem: Health Behavior/Discharge Planning: Goal: Ability to manage health-related needs will improve Outcome: Progressing   Problem: Self-Care: Goal: Ability to participate in self-care as condition permits will improve Outcome: Progressing Goal: Verbalization of feelings and concerns over difficulty with self-care will improve Outcome: Progressing   Problem: Nutrition: Goal: Risk of aspiration will decrease Outcome: Progressing   Problem: Ischemic Stroke/TIA Tissue Perfusion: Goal: Complications of ischemic stroke/TIA will be minimized Outcome: Progressing

## 2022-07-05 NOTE — Assessment & Plan Note (Signed)
Continue levothyroxine 

## 2022-07-05 NOTE — TOC CM/SW Note (Addendum)
Patient reported to staff that he is from [address].  CSW attempted to call Neoma Laming with [facility] to confirm and see if he was receiving therapy at SNF/needs PT/OT evals, left VM requesting return call. TOC to continue to follow.   1:15- Per MD patient may be medically ready to DC back to SNF today. CSW attempted another call to Neoma Laming in Admissions, left VM. CSW called [facility], spoke to Nurse Stanton Kidney who stated she is not sure if patient is long term care there or short term rehab. Stanton Kidney is not sure if patient can return today. She stated she will check with leadership and call CSW back.   1:37- Call from [facility] at [facility] who stated she is also trying to reach [place] in Admissions. Per Stanton Kidney, patient cannot come back until they confirm with Neoma Laming.  Updated MD.   Oleh Genin, LCSW [phone]

## 2022-07-05 NOTE — Plan of Care (Signed)
  Problem: Education: Goal: Knowledge of General Education information will improve Description: Including pain rating scale, medication(s)/side effects and non-pharmacologic comfort measures Outcome: Progressing Note: [place] Patient Guide (book)   Problem: Pain Managment: Goal: General experience of comfort will improve Outcome: Progressing   Problem: Safety: Goal: Ability to remain free from injury will improve Outcome: Progressing

## 2022-07-05 NOTE — Assessment & Plan Note (Signed)
Continue Lasix and Isordil

## 2022-07-05 NOTE — Assessment & Plan Note (Signed)
On statin.  LDL controlled and at goal however with a low HDL and elevated triglycerides.  We will start Lopid.

## 2022-07-05 NOTE — Assessment & Plan Note (Addendum)
Sliding scale insulin coverage.  A1c at 10.8.  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  Upon discharge, will greatly increase his metformin.  Currently on 500 daily.

## 2022-07-05 NOTE — Assessment & Plan Note (Signed)
CPAP nightly

## 2022-07-05 NOTE — Evaluation (Signed)
Occupational Therapy Evaluation Patient Details Name: [name] [name] MRN: [id number] DOB: Jan 09, 1946 Today's Date: 07/05/2022   History of Present Illness Pt is a 76 year old male admitted with slurred speach, code stroke activiated. 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 Pt is alert and oriented x4, appropriate awareness. Pt reports he lives at [facility] since December 2021. At baseline, pt reports MOD I in bed mobility, supervision with stand pivot transfer to mwc. BUE strength appears WFL, vision appears WFL, FMC/dexterity appears WFL. On this date, pt presents with deficits in strength, activity tolerance affecting safe and optimal ADL completion. MIN A required for supine<>sit, STS with MIN A with bed extremely elevated per pt request. Pt able to take two side steps up the bed with RW with CGA. MAX A required for LB dressing with pt reporting pain throughout BLE with movement and touch. Pt would benefit from STR to address deficits. OT will follow acutely.      Recommendations for follow up therapy are one component of a multi-disciplinary discharge planning process, led by the attending physician.  Recommendations may be updated based on patient status, additional functional criteria and insurance authorization.   Follow Up Recommendations  Skilled nursing-short term rehab (<3 hours/day)    Assistance Recommended at Discharge Frequent or constant Supervision/Assistance  Patient can return home with the following A lot of help with walking and/or transfers;A lot of help with bathing/dressing/bathroom    Functional Status Assessment  Patient has had a recent decline in their functional status and demonstrates the ability to make significant improvements in  function in a reasonable and predictable amount of time.  Equipment Recommendations  None recommended by OT (per next venue of care)    Recommendations for Other Services       Precautions / Restrictions Restrictions Weight Bearing Restrictions: No      Mobility Bed Mobility Overal bed mobility: Needs Assistance Bed Mobility: Supine to Sit, Sit to Supine     Supine to sit: HOB elevated, Min assist Sit to supine: Mod assist, HOB elevated   General bed mobility comments: increased time and very effortful    Transfers Overall transfer level: Needs assistance Equipment used: Rolling walker (2 wheels) Transfers: Sit to/from Stand Sit to Stand: Min assist, From elevated surface           General transfer comment: significantly elevated surface per pt request, reports similar transfer at facility      Balance Overall balance assessment: Needs assistance Sitting-balance support: Feet supported Sitting balance-Leahy Scale: Good     Standing balance support: Reliant on assistive device for balance, During functional activity, Bilateral upper extremity supported Standing balance-Leahy Scale: Poor                             ADL either performed or assessed with clinical judgement   ADL Overall ADL's : Needs assistance/impaired Eating/Feeding: Set up;Sitting Eating/Feeding Details (indicate cue type and reason): pt able to cut food and feed himself             Upper Body Dressing : Minimal assistance;Sitting   Lower Body Dressing: Maximal assistance;Bed level Lower Body Dressing Details (indicate cue type and reason): reports assist at baseline     [place]  and Hygiene: Maximal assistance               Vision Patient Visual Report: No change from baseline       Perception     Praxis      Pertinent Vitals/Pain Pain Assessment Pain Assessment: Faces Faces Pain Scale: Hurts little more Pain Location: BLE to light  touch and movement Pain Descriptors / Indicators: Stabbing, Discomfort Pain Intervention(s): Limited activity within patient's tolerance, Monitored during session, Repositioned     Hand Dominance Right   Extremity/Trunk Assessment Upper Extremity Assessment Upper Extremity Assessment: Overall WFL for tasks assessed (BUE strength at least 4/5 throughout, FMC/dexterity appears [facility])   Lower Extremity Assessment Lower Extremity Assessment: RLE deficits/detail;LLE deficits/detail RLE Sensation: history of peripheral neuropathy RLE Coordination: decreased gross motor LLE Sensation: history of peripheral neuropathy LLE Coordination: decreased gross motor       Communication Communication Communication: No difficulties   Cognition Arousal/Alertness: Awake/alert Behavior During Therapy: WFL for tasks assessed/performed Overall Cognitive Status: Within Functional Limits for tasks assessed                                       General Comments  vital signs appear stable throughout    Exercises     Shoulder Instructions      Home Living Family/patient expects to be discharged to:: Skilled nursing facility   Available Help at Discharge: [place] Type of Home: [place]                           Additional Comments: Pt reports he lives at [facility], still getting rehab, per pt eventually wants to return home  Lives With:  (at facility)    Prior Functioning/Environment Prior Level of Function : Needs assist             Mobility Comments: pt reports he SPT to mwc from bed with supervision from staff, mobility via wheelchair level primarily ADLs Comments: assist for LB dressing, bathing, toileting; Assist for IADLs        OT Problem List: Decreased activity tolerance;Impaired balance (sitting and/or standing)      OT Treatment/Interventions: Therapeutic activities;Self-care/ADL training;Therapeutic  exercise;Visual/perceptual remediation/compensation;Energy conservation;Patient/family education    OT Goals(Current goals can be found in the care plan section) Acute Rehab OT Goals Patient Stated Goal: return to rehab OT Goal Formulation: With patient Time For Goal Achievement: 07/19/22 Potential to Achieve Goals: Good ADL Goals Pt Will Perform Grooming: with set-up;sitting Pt Will Transfer to Toilet: with supervision;stand pivot transfer;bedside commode Pt Will Perform Toileting - Clothing Manipulation and hygiene: with supervision;sit to/from stand  OT Frequency: Min 2X/week    Co-evaluation              AM-PAC OT "6 Clicks" Daily Activity     Outcome Measure Help from another person eating meals?: None Help from another person taking care of personal grooming?: None Help from another person toileting, which includes using toliet, bedpan, or urinal?: A Lot Help from another person bathing (including washing, rinsing, drying)?: A Lot Help from another person to put on and taking off regular upper body clothing?: A Little Help from another person to put on and taking off regular lower body clothing?: A Lot 6 Click Score: 17   End of Session Equipment Utilized During Treatment: Rolling walker (2 wheels) Nurse Communication:  Mobility status  Activity Tolerance: Patient tolerated treatment well Patient left: in bed;with call bell/phone within reach;with bed alarm set  OT Visit Diagnosis: Unsteadiness on feet (R26.81)                Time: 0459-9774 OT Time Calculation (min): 21 min Charges:  OT General Charges $OT Visit: 1 Visit OT Evaluation $OT Eval Moderate Complexity: 1 Mod  Shanon Payor, OTD OTR/L  07/05/22, 1:27 PM

## 2022-07-05 NOTE — Assessment & Plan Note (Addendum)
Currently compensated Continue home isosorbide and Lasix.  Echocardiogram done this hospitalization notes grade 1 diastolic dysfunction.

## 2022-07-05 NOTE — Assessment & Plan Note (Signed)
Not acutely exacerbated Continue home inhaler with as needed albuterol

## 2022-07-05 NOTE — Assessment & Plan Note (Addendum)
Dysarthric speech initially, but symptoms resolved.  Work-up noting unrevealing echo and carotid Dopplers.  MRI negative for CVA.  Likely small vessel disease.  LDL at goal although does have hypertriglyceridemia.  Diabetes mellitus is uncontrolled.  A1c greater than 10.  See below.  Cleared by speech therapy.  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  Allow for permissive hypertension for first 24-48 hours from TIA.   Plavix 300 mg load and continue at '75mg'$  daily .(Aspirin not started as patient has history of angioedema to ibuprofen)  Noted that patient has history of GERD and has been on Nexium.  Nexium has been discontinued and patient starting on Protonix.  Nexium has an interaction with Plavix, decreasing efficacy.

## 2022-07-05 NOTE — Evaluation (Signed)
Speech Language Pathology Evaluation Patient Details Name: [name] [name] MRN: [id number] DOB: 09-06-1946 Today's Date: 07/05/2022 Time: 9509-3267 SLP Time Calculation (min) (ACUTE ONLY): 45 min  Problem List:  Patient Active Problem List   Diagnosis Date Noted   TIA (transient ischemic attack) 07/05/2022   Abnormal MRI of head 07/05/2022   Diabetes mellitus without complication (HCC)    Obesity (BMI 30-39.9)    Cervical myelopathy with history of cervical discectomy and fusion (HCC)    Esophageal dysphagia 11/18/2020   Elevated BUN 11/18/2020   AKI (acute kidney injury) ([place]) 11/18/2020   HCAP (healthcare-associated pneumonia)    Mucus plugging of bronchi    Left cervical radiculopathy 10/26/2020   Acute cervical radiculopathy 10/26/2020   Pressure injury of skin 10/26/2020   Problems with swallowing and mastication    Stricture and stenosis of esophagus    Hx of colonic polyps    Polyp of ascending colon    Bilateral carpal tunnel syndrome 02/13/2020   Neurocardiogenic syncope 03/02/2019   Lymphedema 02/04/2019   Tobacco use 02/03/2019   CHF (congestive heart failure) ([place]) 12/23/2018   Chronic diastolic CHF (congestive heart failure) ([place]) 11/27/2018   Cellulitis 11/27/2018   Hypothyroidism 11/27/2018   OSA (obstructive sleep apnea) 11/27/2018   Hypertension 11/27/2018   HLD (hyperlipidemia) 11/27/2018   Protein-calorie malnutrition ([place]) 03/02/2018   Neuropathy 03/02/2018   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   Difficulty in voiding 03/01/2018   Vitamin D deficiency 03/01/2018   Vitamin B12 deficiency 03/01/2018   Insomnia 03/01/2018   COPD (chronic obstructive pulmonary disease) ([place]) 02/11/2018   Pain due to onychomycosis of nail 02/11/2018   Onychomycosis 02/11/2018   Traumatic ecchymosis of foot, right, initial encounter 02/11/2018   Xerostomia 02/11/2018   Frequent falls 02/11/2018   Acute respiratory failure ([place]) 02/07/2018   Lumbar stenosis with  neurogenic claudication 08/15/2015   History of thyroid cancer 05/05/2014   Past Medical History:  Past Medical History:  Diagnosis Date   Anxiety disorder 05/05/2014   unspecifed   Arthritis    CHF (congestive heart failure) (HCC)    Colonic polyp    COPD (chronic obstructive pulmonary disease) ([place])    Depression    Diabetes mellitus without complication ([place])    Dysphagia    Dyspnea    Elevated prostate specific antigen (PSA)    Essential (primary) hypertension 05/05/2014   GERD (gastroesophageal reflux disease)    GI bleed    due to diverticulitis    Heart murmur    hx of 1960s   Hemorrhoids    History of thyroid cancer 05/05/2014   Hypercholesteremia    Hyperlipidemia    Hypertension    Hypothyroidism 05/05/2014   Male erectile dysfunction 12/29/2012   Malignant neoplasm of prostate ([place])    10/30/2011 T1c - Identified by needle biopsy . Gleason 7 (3+4) 5 of 12 cores, Bilateral    Pneumonia    Prediabetes 06/22/2017   Sleep apnea    CPAP , report on chart , not used in last 2 weeks    Urinary obstruction    not elsewhere classified   Past Surgical History:  Past Surgical History:  Procedure Laterality Date   BACK SURGERY     lumbar 1987    BACK SURGERY     C-6- Plate&Pin   CATARACT EXTRACTION W/PHACO Right 09/15/2018   Procedure: CATARACT EXTRACTION PHACO AND INTRAOCULAR LENS PLACEMENT ([place]) RIGHT;  Surgeon: Leandrew Koyanagi, MD;  Location: [place];  Service: Ophthalmology;  Laterality:  Right;  Requests to be last case   CATARACT EXTRACTION W/PHACO Left 10/06/2018   Procedure: CATARACT EXTRACTION PHACO AND INTRAOCULAR LENS PLACEMENT ([place])  LEFT;  Surgeon: Leandrew Koyanagi, MD;  Location: [place];  Service: Ophthalmology;  Laterality: Left;   CERVICAL DISC SURGERY     COLONOSCOPY     10/06/2011, 06/05/2006, 05/06/2002 adematous polyps: CBF 09/2014; Recall Ltr mailed 02/27/2015 (dw)   COLONOSCOPY WITH PROPOFOL N/A 07/06/2015    Procedure: COLONOSCOPY WITH PROPOFOL;  Surgeon: Manya Silvas, MD;  Location[facility] ENDOSCOPY;  Service: Endoscopy;  Laterality: N/A;   COLONOSCOPY WITH PROPOFOL N/A 09/01/2018   Procedure: COLONOSCOPY WITH PROPOFOL;  Surgeon: Manya Silvas, MD;  Location: [facility] ENDOSCOPY;  Service: Endoscopy;  Laterality: N/A;   COLONOSCOPY WITH PROPOFOL N/A 04/17/2020   Procedure: COLONOSCOPY WITH PROPOFOL;  Surgeon: Lucilla Lame, MD;  Location[facility] ENDOSCOPY;  Service: Endoscopy;  Laterality: N/A;   ESOPHAGOGASTRODUODENOSCOPY (EGD) WITH PROPOFOL N/A 04/17/2020   Procedure: ESOPHAGOGASTRODUODENOSCOPY (EGD) WITH PROPOFOL;  Surgeon: Lucilla Lame, MD;  Location: [facility] ENDOSCOPY;  Service: Endoscopy;  Laterality: N/A;   EYE SURGERY     HERNIA REPAIR     umbilical hernia repair    LAMINECTOMY FOR EXCISION / EVACUATION INTRASPINAL LESION     lumbar   OTHER SURGICAL HISTORY     right rotator cuff surgery    OTHER SURGICAL HISTORY     bilateral tubes in ears    POSTERIOR CERVICAL LAMINECTOMY N/A 10/27/2020   Procedure: POSTERIOR CERVICAL LAMINECTOMY CERVICAL THREE-CERVICAL SIX;  Surgeon: Judith Part, MD;  Location: [place];  Service: Neurosurgery;  Laterality: N/A;   POSTERIOR LAMINECTOMY / DECOMPRESSION CERVICAL SPINE     PROSTATE BIOPSY     10/22/2011 Volume:55.8 cc's, PSA:4.5, Free PSA:12%   ROBOT ASSISTED LAPAROSCOPIC RADICAL PROSTATECTOMY  12/15/2011   Procedure: ROBOTIC ASSISTED LAPAROSCOPIC RADICAL PROSTATECTOMY LEVEL 2;  Surgeon: Dutch Gray, MD;  Location: [facility];  Service: Urology;  Laterality: N/A;       ROTATOR CUFF REPAIR Right    SHOULDER ARTHROSCOPY WITH SUBACROMIAL DECOMPRESSION, ROTATOR CUFF REPAIR AND BICEP TENDON REPAIR Left 11/07/2021   Procedure: SHOULDER ARTHROSCOPY WITH DEBRIDEMENT, DECOMPRESSION, ROTATOR CUFF REPAIR AND BICEPS TENODESIS.;  Surgeon: Corky Mull, MD;  Location: [facility];  Service: Orthopedics;  Laterality: Left;   TONSILLECTOMY     TOTAL THYROIDECTOMY     HPI:   Pt  is a 76 y.o. male with medical history significant for Multiple medical issues including DM, HTN, hypothyroidism, COPD, diastolic CHF, Obesity, Esophageal phase dysphagia w/ stenosis s/p dilatation 2021, REFLUX issues per pt - Not on a PPI, hypothyroidism, non ambulant since cervical fusion with residual myelopathy  who was sent to the ED after he experienced 3 brief episodes of slurred speech at the nursing facility starting at around 10 PM.  He arrived to the ED as a code stroke by which time his symptoms had resolved.  He denied weakness beyond his baseline for his cervical myelopathy.  ED course and data review: BP 161/77, pulse 57 with otherwise normal vitals.  Labs including CMP, CBC unremarkable except for glucose of 213.  EtOH less than 10, UDS positive for opiates.  Urinalysis sterile and COVID-negative.  Troponin 8.  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   Chest x-ray clear.   MRI: No acute intracranial process. No evidence of acute or subacute  infarct.   Assessment / Plan / Recommendation Clinical Impression   Pt seen  for evaluation of Dysarthria - c/o slurred speech at his Facility prior to admit to the ED. Pt was awake, verbal and conversive. Pleasant. A/O x4. He endorsed feeling as though he had a "thick tongue" yesterday and "people couldn't understand me". He stated his speech if "fine now" but that his tongue is "maybe a little thick feeling?".   Pt appears to present w/ adequate Motor Speech skills in general/complex conversation. Pt is talkative and enjoys conversation - he described details of his medical issues leading him to reside at a Rehab facility. He endorsed REFLUX s/s - even during session stating "it is coming up to my throat and it tastes bad". NSG and MD updated. Pt also endorsed a "dry mouth" at baseline, especially s/p wearing his CPAP at night and that drinking water "helps w/ that". His Speech was 100% intelligible; no  deficits in phonation, resonance, nor motor planning during tasks/conversation. Articulation of speech appeared [facility]; no striking decrease in precision of speech noted by this therapist or NSG.  Pt was given oral care and sips of water to aid any dry mouth. Pt encouraged to sit up when talking vs lying down to improve respiratory support for speech. Pt conversed in general/complex conversation during verbal communication/engagement. He denied any issues negatively impacting his verbal communication and stated he speech was "fine now".  Oral motor exam was WFL: no lingual or labial weakness noted; Cough - strong. Native Dentition. No expressive or receptive aphasia/language deficits noted. No cognitive deficits noted.   Education was completed w/ pt re: dry mouth; strategies given. Encouraged pt to monitor for any return of "thick tongue" and/or difficulty speaking w/ others and alert NSG/MD. Discussed the above w/ pt/NSG/MD. No further skilled ST servcies indicated at this time. Pt/NSG agreed.     SLP Assessment  SLP Recommendation/Assessment: Patient does not need any further Speech [place] [facility] Services SLP Visit Diagnosis: Dysarthria and anarthria (R47.1) (resolved per pt)    Recommendations for follow up therapy are one component of a multi-disciplinary discharge planning process, led by the attending physician.  Recommendations may be updated based on patient status, additional functional criteria and insurance authorization.    Follow Up Recommendations  No SLP follow up    Assistance Recommended at Discharge  None  Functional Status Assessment Patient has had a recent decline in their functional status and demonstrates the ability to make significant improvements in function in a reasonable and predictable amount of time.  Frequency and Duration  (n/a)   (n/a)      SLP Evaluation Cognition  Overall Cognitive Status: Within Functional Limits for tasks  assessed Arousal/Alertness: Awake/alert Orientation Level: Oriented X4 Year: 2023 Month: August Attention: Focused;Sustained Focused Attention: Appears intact Sustained Attention: Appears intact Memory: Appears intact Awareness: Appears intact Problem Solving: Appears intact Behaviors:  (n/a) Safety/Judgment: Appears intact       Comprehension  Auditory Comprehension Overall Auditory Comprehension: Appears within functional limits for tasks assessed Yes/No Questions: Within Functional Limits Commands: Within Functional Limits Conversation: Complex Interfering Components:  (n/a) Visual Recognition/Discrimination Discrimination: Not tested Reading Comprehension Reading Status: Not tested    Expression Expression Primary Mode of Expression: Verbal Verbal Expression Overall Verbal Expression: Appears within functional limits for tasks assessed Initiation: No impairment Automatic Speech:  (WFL) Level of Generative/Spontaneous Verbalization: Conversation Repetition: No impairment Naming: No impairment Pragmatics: No impairment Interfering Components:  (n/a) Non-Verbal Means of Communication: Not applicable Written Expression Dominant Hand: Right Written Expression: Not tested   Oral / Motor  Oral Motor/Sensory Function Overall Oral Motor/Sensory Function: Within functional limits Motor Speech Overall Motor Speech: Appears within functional limits for tasks assessed Respiration: Within functional limits Phonation: Normal Resonance: Within functional limits Articulation: Within functional limitis Intelligibility: Intelligible Motor Planning: Witnin functional limits Motor Speech Errors: Not applicable                Orinda Kenner, MS, [place]; [place] [phone] (ascom) Estuardo Frisbee 07/05/2022, 11:34 AM

## 2022-07-05 NOTE — Assessment & Plan Note (Addendum)
Nonambulatory status No acute issues suspected.  Patient follows with neurosurgeon, Dr. Cari Caraway.  Last seen April 2023.  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  We will increase his Lyrica.  Discussed with patient and he amenable to this plan.

## 2022-07-05 NOTE — Assessment & Plan Note (Addendum)
Meets criteria with BMI greater than 35 and comorbidities of diabetes, hypertension

## 2022-07-05 NOTE — H&P (Addendum)
History and Physical    Patient: [name] [name] QAS:[id number] DOB: 1946-05-02 DOA: 07/04/2022 DOS: the patient was seen and examined on 07/05/2022 PCP: Idelle Crouch, MD  Patient coming from: Home  Chief Complaint:  Chief Complaint  Patient presents with   Aphasia    Patient C/O intermittent slurred speech and states he feels like his tongue is swelling    HPI: [name] [name] is a 76 y.o. male with medical history significant for DM, HTN, hypothyroidism, COPD, diastolic CHF, obesity, esophageal stenosis s/p dilatation 2021, hypothyroidism, non ambulant since cervical fusion with residual myelopathy  who was sent to the ED after he experienced 3 brief episodes of slurred speech at the nursing facility starting at around 10 PM.  He arrived to the ED as a code stroke by which time his symptoms had resolved.  He denied weakness beyond his baseline for his cervical myelopathy. ED course and data review: BP 161/77, pulse 57 with otherwise normal vitals.  Labs including CMP, CBC unremarkable except for glucose of 213.  EtOH less than 10, UDS positive for opiates.  Urinalysis sterile and COVID-negative.  Troponin 8.  EKG, personally viewed and interpreted showing sinus at 55 with RBBB and LAFB chest x-ray clear Head CT code stroke protocol showed no acute intracranial process.  Did show air-fluid level in left sphenoid sinus consistent with acute sinusitis.  MRI ordered and pending. Patient was seen in consultation by teleneurology with recommendation for admission for TIA work-up and aspirin.  Hospitalist consulted for admission.     Past Medical History:  Diagnosis Date   Anxiety disorder 05/05/2014   unspecifed   Arthritis    CHF (congestive heart failure) (HCC)    Colonic polyp    COPD (chronic obstructive pulmonary disease) ([place])    Depression    Diabetes mellitus without complication (HCC)    Dysphagia    Dyspnea    Elevated prostate specific antigen (PSA)    Essential (primary)  hypertension 05/05/2014   GERD (gastroesophageal reflux disease)    GI bleed    due to diverticulitis    Heart murmur    hx of 1960s   Hemorrhoids    History of thyroid cancer 05/05/2014   Hypercholesteremia    Hyperlipidemia    Hypertension    Hypothyroidism 05/05/2014   Male erectile dysfunction 12/29/2012   Malignant neoplasm of prostate ([place])    10/30/2011 T1c - Identified by needle biopsy . Gleason 7 (3+4) 5 of 12 cores, Bilateral    Pneumonia    Prediabetes 06/22/2017   Sleep apnea    CPAP , report on chart , not used in last 2 weeks    Urinary obstruction    not elsewhere classified   Past Surgical History:  Procedure Laterality Date   BACK SURGERY     lumbar 1987    BACK SURGERY     C-6- Plate&Pin   CATARACT EXTRACTION W/PHACO Right 09/15/2018   Procedure: CATARACT EXTRACTION PHACO AND INTRAOCULAR LENS PLACEMENT ([place]) RIGHT;  Surgeon: Leandrew Koyanagi, MD;  Location: [place];  Service: Ophthalmology;  Laterality: Right;  Requests to be last case   CATARACT EXTRACTION W/PHACO Left 10/06/2018   Procedure: CATARACT EXTRACTION PHACO AND INTRAOCULAR LENS PLACEMENT ([place])  LEFT;  Surgeon: Leandrew Koyanagi, MD;  Location: [place];  Service: Ophthalmology;  Laterality: Left;   CERVICAL DISC SURGERY     COLONOSCOPY     10/06/2011, 06/05/2006, 05/06/2002 adematous polyps: CBF 09/2014; Recall Ltr mailed 02/27/2015 (dw)  COLONOSCOPY WITH PROPOFOL N/A 07/06/2015   Procedure: COLONOSCOPY WITH PROPOFOL;  Surgeon: Manya Silvas, MD;  Location: [facility] ENDOSCOPY;  Service: Endoscopy;  Laterality: N/A;   COLONOSCOPY WITH PROPOFOL N/A 09/01/2018   Procedure: COLONOSCOPY WITH PROPOFOL;  Surgeon: Manya Silvas, MD;  Location: [facility] ENDOSCOPY;  Service: Endoscopy;  Laterality: N/A;   COLONOSCOPY WITH PROPOFOL N/A 04/17/2020   Procedure: COLONOSCOPY WITH PROPOFOL;  Surgeon: Lucilla Lame, MD;  Location: [facility] ENDOSCOPY;  Service: Endoscopy;  Laterality: N/A;    ESOPHAGOGASTRODUODENOSCOPY (EGD) WITH PROPOFOL N/A 04/17/2020   Procedure: ESOPHAGOGASTRODUODENOSCOPY (EGD) WITH PROPOFOL;  Surgeon: Lucilla Lame, MD;  Location: [facility] ENDOSCOPY;  Service: Endoscopy;  Laterality: N/A;   EYE SURGERY     HERNIA REPAIR     umbilical hernia repair    LAMINECTOMY FOR EXCISION / EVACUATION INTRASPINAL LESION     lumbar   OTHER SURGICAL HISTORY     right rotator cuff surgery    OTHER SURGICAL HISTORY     bilateral tubes in ears    POSTERIOR CERVICAL LAMINECTOMY N/A 10/27/2020   Procedure: POSTERIOR CERVICAL LAMINECTOMY CERVICAL THREE-CERVICAL SIX;  Surgeon: Judith Part, MD;  Location: [employer];  Service: Neurosurgery;  Laterality: N/A;   POSTERIOR LAMINECTOMY / DECOMPRESSION CERVICAL SPINE     PROSTATE BIOPSY     10/22/2011 Volume:55.8 cc's, PSA:4.5, Free PSA:12%   ROBOT ASSISTED LAPAROSCOPIC RADICAL PROSTATECTOMY  12/15/2011   Procedure: ROBOTIC ASSISTED LAPAROSCOPIC RADICAL PROSTATECTOMY LEVEL 2;  Surgeon: Dutch Gray, MD;  Location: [facility];  Service: Urology;  Laterality: N/A;       ROTATOR CUFF REPAIR Right    SHOULDER ARTHROSCOPY WITH SUBACROMIAL DECOMPRESSION, ROTATOR CUFF REPAIR AND BICEP TENDON REPAIR Left 11/07/2021   Procedure: SHOULDER ARTHROSCOPY WITH DEBRIDEMENT, DECOMPRESSION, ROTATOR CUFF REPAIR AND BICEPS TENODESIS.;  Surgeon: Corky Mull, MD;  Location: [facility];  Service: Orthopedics;  Laterality: Left;   TONSILLECTOMY     TOTAL THYROIDECTOMY     Social History:  reports that he quit smoking about 3 years ago. His smoking use included cigarettes. He has a 17.50 pack-year smoking history. He has never used smokeless tobacco. He reports that he does not currently use alcohol after a past usage of about 8.0 standard drinks of alcohol per week. He reports that he does not use drugs.  Allergies  Allergen Reactions   Ibuprofen Other (See Comments)    Other reaction(s): Angioedema    Family History  Problem Relation Age of Onset    Hypertension Mother    Anxiety disorder Mother    Stroke Mother        "mini strokes"   Coronary artery disease Mother    Heart attack Mother    Emphysema Father    Heart disease Father    Scoliosis Father    Coronary artery disease Father    GU problems Neg Hx    Kidney disease Neg Hx    Prostate cancer Neg Hx     Prior to Admission medications   Medication Sig Start Date End Date Taking? Authorizing Provider  albuterol (VENTOLIN HFA) 108 (90 Base) MCG/ACT inhaler Inhale 2 puffs into the lungs every 6 (six) hours as needed for wheezing or shortness of breath.    [provider]  atorvastatin (LIPITOR) 40 MG tablet Take 1 tablet (40 mg total) by mouth daily. 11/29/20 11/04/21  Ashok Pall, MD  Benzocaine-Menthol 15-3.6 MG LOZG Use as directed 1 lozenge in the mouth or throat every 4 (four) hours as needed (sore throat).  [provider]  bisacodyl (DULCOLAX) 10 MG suppository Place 10 mg rectally daily as needed for moderate constipation.    [provider]  dicyclomine (BENTYL) 10 MG capsule Take 10 mg by mouth 3 (three) times daily as needed (abdominal pain).    [provider]  donepezil (ARICEPT) 10 MG tablet Take 10 mg by mouth at bedtime. 01/04/21   [provider]  ergocalciferol (VITAMIN D2) 1.25 MG (50000 UT) capsule Take 50,000 Units by mouth every Wednesday.    [provider]  esomeprazole (NEXIUM) 40 MG capsule Take 40 mg by mouth daily.    [provider]  FLUoxetine (PROZAC) 40 MG capsule Take 40 mg by mouth daily.    [provider]  fluticasone-salmeterol (ADVAIR) 250-50 MCG/ACT AEPB Inhale 1 puff into the lungs in the morning and at bedtime.    [provider]  furosemide (LASIX) 40 MG tablet Take 40 mg by mouth 2 (two) times daily.    [provider]  hydrocortisone 2.5 % cream Apply 1 application topically every 12 (twelve) hours as needed (per rectum, hemorrhoids).    [provider]  isosorbide dinitrate (ISORDIL) 10 MG tablet Take 1 tablet (10 mg total) by mouth 3 (three) times daily. 11/28/20 11/04/21  Ashok Pall, MD  levothyroxine (SYNTHROID) 200 MCG tablet Take 200 mcg by mouth daily before breakfast.    [provider]  magnesium oxide (MAG-OX) 400 MG tablet Take 400 mg by mouth 2 (two) times daily. 01/25/21   [provider]  melatonin 5 MG TABS Take 5 mg by mouth at bedtime.    [provider]  metFORMIN (GLUCOPHAGE) 500 MG tablet Take 500 mg by mouth daily. 05/24/20   [provider]  midodrine (PROAMATINE) 5 MG tablet Take 5 mg by mouth daily.    [provider]  oxyCODONE (OXY IR/ROXICODONE) 5 MG immediate release tablet Take 1-2 tablets (5-10 mg total) by mouth every 4 (four) hours as needed for severe pain or moderate pain. 11/07/21   Poggi, Marshall Cork, MD  potassium chloride SA (K-DUR,KLOR-CON) 20 MEQ tablet Take 20 mEq by mouth 3 (three) times daily with meals.    [provider]  pregabalin (LYRICA) 150 MG capsule Take 150 mg by mouth at bedtime.    [provider]  senna-docusate (SENOKOT-S) 8.6-50 MG tablet Take 1 tablet by mouth 2 (two) times daily.    [provider]  simethicone (MYLICON) 962 MG chewable tablet Chew 125 mg by mouth 2 (two) times daily.    [provider]  traZODone (DESYREL) 100 MG tablet Take 100 mg by mouth at bedtime.  02/04/20   [provider]  traZODone (DESYREL) 50 MG tablet Take 25 mg by mouth daily.    [provider]    Physical Exam: Vitals:   07/04/22 2326 07/04/22 2330 07/05/22 0000 07/05/22 0100  BP: (!) 161/77 (!) 150/70 (!) 145/75 (!) 142/71  Pulse: (!) 57 (!) 56 (!) 56 (!) 55  Resp: '11 11 16 19  '$ SpO2: 96% 97% 95% 95%  Weight:       Physical Exam Vitals and nursing note reviewed.  Constitutional:      General: He is not in acute distress. HENT:     Head: Normocephalic and atraumatic.  Cardiovascular:      Rate and Rhythm: Normal rate and regular rhythm.     Heart sounds: Normal heart sounds.  Pulmonary:     Effort: Pulmonary effort is normal.  Breath sounds: Normal breath sounds.  Abdominal:     Palpations: Abdomen is soft.     Tenderness: There is no abdominal tenderness.  Musculoskeletal:     Left lower leg: Edema present.  Neurological:     Mental Status: Mental status is at baseline.     Labs on Admission: I have personally reviewed following labs and imaging studies  CBC: Recent Labs  Lab 07/04/22 2330  WBC 8.2  NEUTROABS 5.1  HGB 13.3  HCT 41.5  MCV 87.4  PLT 660   Basic Metabolic Panel: Recent Labs  Lab 07/04/22 2330  NA 138  K 3.6  CL 104  CO2 25  GLUCOSE 213*  BUN 18  CREATININE 1.03  CALCIUM 8.9   GFR: CrCl cannot be calculated (Unknown ideal weight.). Liver Function Tests: Recent Labs  Lab 07/04/22 2330  AST 22  ALT 25  ALKPHOS 81  BILITOT 0.7  PROT 6.3*  ALBUMIN 3.4*   No results for input(s): "LIPASE", "AMYLASE" in the last 168 hours. No results for input(s): "AMMONIA" in the last 168 hours. Coagulation Profile: Recent Labs  Lab 07/04/22 2330  INR 1.0   Cardiac Enzymes: No results for input(s): "CKTOTAL", "CKMB", "CKMBINDEX", "TROPONINI" in the last 168 hours. BNP (last 3 results) No results for input(s): "PROBNP" in the last 8760 hours. HbA1C: No results for input(s): "HGBA1C" in the last 72 hours. CBG: No results for input(s): "GLUCAP" in the last 168 hours. Lipid Profile: No results for input(s): "CHOL", "HDL", "LDLCALC", "TRIG", "CHOLHDL", "LDLDIRECT" in the last 72 hours. Thyroid Function Tests: No results for input(s): "TSH", "T4TOTAL", "FREET4", "T3FREE", "THYROIDAB" in the last 72 hours. Anemia Panel: No results for input(s): "VITAMINB12", "FOLATE", "FERRITIN", "TIBC", "IRON", "RETICCTPCT" in the last 72 hours. Urine analysis:    Component Value Date/Time   COLORURINE STRAW (A) 07/05/2022 0054   APPEARANCEUR CLEAR  (A) 07/05/2022 0054   LABSPEC 1.010 07/05/2022 0054   PHURINE 5.0 07/05/2022 0054   GLUCOSEU NEGATIVE 07/05/2022 0054   HGBUR NEGATIVE 07/05/2022 0054   [place] 07/05/2022 0054   KETONESUR NEGATIVE 07/05/2022 0054   PROTEINUR NEGATIVE 07/05/2022 0054   NITRITE NEGATIVE 07/05/2022 0054   LEUKOCYTESUR NEGATIVE 07/05/2022 0054    Radiological Exams on Admission: CT HEAD CODE STROKE WO CONTRAST  Result Date: 07/04/2022 CLINICAL DATA:  Code stroke.  Slurred speech, numbness EXAM: CT HEAD WITHOUT CONTRAST TECHNIQUE: Contiguous axial images were obtained from the base of the skull through the vertex without intravenous contrast. RADIATION DOSE REDUCTION: This exam was performed according to the departmental dose-optimization program which includes automated exposure control, adjustment of the mA and/or kV according to patient size and/or use of iterative reconstruction technique. COMPARISON:  09/05/2019 FINDINGS: Brain: No evidence of acute infarction, hemorrhage, cerebral edema, mass, mass effect, or midline shift. No hydrocephalus or extra-axial collection. Vascular: No hyperdense vessel. Skull: Negative for fracture or focal lesion. Sinuses/Orbits: Mucosal thickening and bubbly fluid in the ethmoid air cells. Air-fluid level and bubbly fluid in the left sphenoid sinus. Mild mucosal thickening in the maxillary sinuses. Other: Fluid in the bilateral mastoid air cells. ASPECTS [facility] Stroke Program Early CT Score) - Ganglionic level infarction (caudate, lentiform nuclei, internal capsule, insula, M1-M3 cortex): 7 - Supraganglionic infarction (M4-M6 cortex): 3 Total score (0-10 with 10 being normal): 10 IMPRESSION: 1. No acute intracranial process. 2. ASPECTS is 10 3. Air-fluid level with bubbly fluid in the ethmoid air cells and left sphenoid sinus, as can be seen with acute sinusitis. Correlate with  symptoms. Code stroke imaging results were communicated on 07/04/2022 at 11:47 pm to provider  SUNG via telephone, who verbally acknowledged these results. Electronically Signed   By: Merilyn [place] M.D.   On: 07/04/2022 23:50     Data Reviewed: Relevant notes from primary care and specialist visits, past discharge summaries as available in EHR, including Care Everywhere. Prior diagnostic testing as pertinent to current admission diagnoses Updated medications and problem lists for reconciliation ED course, including vitals, labs, imaging, treatment and response to treatment Triage notes, nursing and pharmacy notes and ED provider's notes Notable results as noted in HPI   Assessment and Plan: * TIA (transient ischemic attack) Permissive hypertension for first 24-48 hrs post stroke onset: Prn Labetalol IV or Vasotec IV If BP greater than 220/120  Statins for LDL goal less than 70  Plavix 300 mg load and continue at '75mg'$  daily .(Aspirin not started as patient has history of angioedema to ibuprofen) Continue home atorvastatin MRI post-admission negative for acute stroke telemetry, echo Avoid dextrose containing fluids, Maintain euglycemia, euthermia Neuro checks q4 hrs x 24 hrs and then per shift Head of bed 30 degrees Physical therapy/Occupational therapy/Speech therapy if failed dysphagia screen Neurology consult to follow   Chronic diastolic CHF (congestive heart failure) (HCC) Currently compensated Continue home isosorbide and Lasix  Cervical myelopathy with history of cervical discectomy and fusion ([place]) Nonambulatory status No acute issues suspected.  Patient follows with neurosurgeon, Dr. Cari Caraway.  Last seen April 2023  Obesity (BMI 30-39.9) Potential complicating factor to overall prognosis and care considering other comorbidities  Diabetes mellitus without complication (HCC) Sliding scale insulin coverage.  Holding metformin  Esophageal dysphagia History of esophageal stricture with dilatation in 2021 Currently asymptomatic   Hypertension Continue Lasix  and Isordil  OSA (obstructive sleep apnea) CPAP nightly  Hypothyroidism Continue levothyroxine  COPD (chronic obstructive pulmonary disease) (HCC) Not acutely exacerbated Continue home inhaler with as needed albuterol        DVT prophylaxis: Lovenox  Consults: Neurologist, Dr. Quinn Axe  Advance Care Planning:   Code Status: Prior   Family Communication: none  Disposition Plan: Back to previous home environment  Severity of Illness: The appropriate patient status for this patient is OBSERVATION. Observation status is judged to be reasonable and necessary in order to provide the required intensity of service to ensure the patient's safety. The patient's presenting symptoms, physical exam findings, and initial radiographic and laboratory data in the context of their medical condition is felt to place them at decreased risk for further clinical deterioration. Furthermore, it is anticipated that the patient will be medically stable for discharge from the hospital within 2 midnights of admission.   Author: Athena Masse, MD 07/05/2022 1:39 AM  For on call review [email or web address].

## 2022-07-05 NOTE — Progress Notes (Addendum)
[facility] Progress Note  Patient: [name] [name]    WUJ:[id number]  DOA: 07/04/2022    Date of Service: the patient was seen and examined on 07/05/2022  Brief hospital course: 76 year old male with past medical history of diabetes mellitus, hypertension, hypothyroidism, diastolic CHF, morbid obesity and COPD, who is nonambulatory after a cervical fusion which had residual myelopathy, who presented to the emergency department from his skilled nursing facility on the night of 8/18 with slurred speech.  This episode lasted only for a minute and then resolved, but then he had 2 more episodes which also resolved.  CT scan of the head was unremarkable.  Neurology was consulted.  Patient was brought in for further evaluation and work-up for TIA.  MRI also negative for acute CVA.  Assessment and Plan: Assessment and Plan: * TIA (transient ischemic attack) Dysarthric speech initially, but symptoms resolved.  Work-up noting unrevealing echo and carotid Dopplers.  MRI negative for CVA.  Likely small vessel disease.  LDL at goal although does have hypertriglyceridemia.  Diabetes mellitus is uncontrolled.  A1c greater than 10.  See below.  Cleared by speech therapy.  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  Allow for permissive hypertension for first 24-48 hours from TIA.   Plavix 300 mg load and continue at '75mg'$  daily .(Aspirin not started as patient has history of angioedema to ibuprofen)  Uncontrolled diabetes mellitus with hyperglycemia, without long-term current use of insulin (HCC) Sliding scale insulin coverage.  A1c at 10.8.  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  Upon discharge, will greatly increase his metformin.  Currently on 500 daily.  Cervical myelopathy with history of cervical discectomy and fusion (HCC) Nonambulatory status No acute issues suspected.  Patient follows with neurosurgeon, Dr. Cari Caraway.  Last seen April 2023.   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  We will increase his Lyrica.  Discussed with patient and he amenable to this plan.  OSA (obstructive sleep apnea) CPAP nightly  Hypertension Continue Lasix and Isordil  Chronic diastolic CHF (congestive heart failure) (HCC) Currently compensated Continue home isosorbide and Lasix.  Echocardiogram done this hospitalization notes grade 1 diastolic dysfunction.  COPD (chronic obstructive pulmonary disease) (HCC) Not acutely exacerbated Continue home inhaler with as needed albuterol  Mixed hyperlipidemia On statin.  LDL controlled and at goal however with a low HDL and elevated triglycerides.  We will start Lopid.  Esophageal dysphagia History of esophageal stricture with dilatation in 2021 Currently asymptomatic   Hypothyroidism Continue levothyroxine  Morbid obesity ([place]) Meets criteria with BMI greater than 35 and comorbidities of diabetes, hypertension       Body mass index is 37.1 kg/m.    Pressure Injury 10/26/20 Perineum Right Stage 2 -  Partial thickness loss of dermis presenting as a shallow open injury with a red, pink wound bed without slough. dry (Active)  10/26/20 1709  Location: Perineum  Location Orientation: Right  Staging: Stage 2 -  Partial thickness loss of dermis presenting as a shallow open injury with a red, pink wound bed without slough.  Wound Description (Comments): dry  Present on Admission: Yes     Pressure Injury 11/05/20 Nose Mid;Upper Unstageable - Full thickness tissue loss in which the base of the injury is covered by slough (yellow, tan, gray, green or brown) and/or eschar (tan, brown or black) in the wound bed. (Active)  11/05/20 0800  Location: Nose  Location Orientation: Mid;Upper  Staging: Unstageable - Full thickness  tissue loss in which the base of the injury is covered by slough (yellow, tan, gray, green or brown) and/or eschar (tan, brown or black) in the wound bed.   Wound Description (Comments):   Present on Admission: No     Pressure Injury 11/17/20 Back Left Stage 2 -  Partial thickness loss of dermis presenting as a shallow open injury with a red, pink wound bed without slough. skin tear (Active)  11/17/20 1019  Location: Back  Location Orientation: Left  Staging: Stage 2 -  Partial thickness loss of dermis presenting as a shallow open injury with a red, pink wound bed without slough.  Wound Description (Comments): skin tear  Present on Admission: No     Consultants: Neurology  Procedures: Carotid Dopplers: No signs of significant stenosis bilaterally Echocardiogram: Grade 1 diastolic dysfunction  Antimicrobials: None  Code Status: Full code   Subjective: Patient doing well, no issues with speech.  Does complain of lower extremity neuropathy that is worsening  Objective: Noted elevated blood pressures Vitals:   07/05/22 1000 07/05/22 1448  BP: (!) 158/75 (!) 178/88  Pulse: (!) 54 (!) 59  Resp: 18 20  Temp: 98.8 F (37.1 C) 98.7 F (37.1 C)  SpO2: 95% 96%   No intake or output data in the 24 hours ending 07/05/22 1601 Filed Weights   07/04/22 2325  Weight: 131.1 kg   Body mass index is 37.1 kg/m.  Exam:  General: Alert and oriented x3, no acute distress HEENT: Normocephalic and atraumatic, mucous membranes are moist Cardiovascular: Regular rate and rhythm, S1-S2 Respiratory: Clear to auscultation bilaterally Abdomen: Soft, nontender, nondistended, positive bowel sounds Musculoskeletal: No clubbing or cyanosis, trace pitting edema Skin: No breaks, tears or lesions Psychiatry: Appropriate, no evidence of psychoses Neurology: Chronic cervical myelopathy, but no acute deficits  Data Reviewed: Lipid panel noting triglycerides of 215 and LDL 58, A1c of 10.8  Disposition:  Status is: Observation     Anticipated discharge date: 8/20 or 8/21  Remaining issues to be resolved so that patient can be discharged:   Patient is from [facility] skilled nursing facility.  Unfortunately, admissions coordinator at the facility has not responded to calls from social worker about patient returning to facility.  Patient therefore and unable to return back today and may not be able to return until Monday.  Family Communication: Declined for me to call family. DVT Prophylaxis:   SCDs    Author: Annita Brod ,MD 07/05/2022 4:01 PM  To reach On-call, see care teams to locate the attending and reach out via [email or web address]. Between 7PM-7AM, please contact night-coverage If you still have difficulty reaching the attending provider, please page the [facility] (Director on Call) for [facility] on amion for assistance.

## 2022-07-05 NOTE — Hospital Course (Signed)
76 year old male with past medical history of diabetes mellitus, hypertension, hypothyroidism, diastolic CHF, morbid obesity and COPD, who is nonambulatory after a cervical fusion which had residual myelopathy, who presented to the emergency department from his skilled nursing facility on the night of 8/18 with slurred speech.  This episode lasted only for a minute and then resolved, but then he had 2 more episodes which also resolved.  CT scan of the head was unremarkable.  Neurology was consulted.  Patient was brought in for further evaluation and work-up for TIA.  MRI also negative for acute CVA.

## 2022-07-06 DIAGNOSIS — I5032 Chronic diastolic (congestive) heart failure: Secondary | ICD-10-CM | POA: Diagnosis not present

## 2022-07-06 DIAGNOSIS — I10 Essential (primary) hypertension: Secondary | ICD-10-CM | POA: Diagnosis not present

## 2022-07-06 DIAGNOSIS — G4733 Obstructive sleep apnea (adult) (pediatric): Secondary | ICD-10-CM

## 2022-07-06 DIAGNOSIS — G459 Transient cerebral ischemic attack, unspecified: Secondary | ICD-10-CM | POA: Diagnosis not present

## 2022-07-06 LAB — GLUCOSE, CAPILLARY
Glucose-Capillary: 195 mg/dL — ABNORMAL HIGH (ref 70–99)
Glucose-Capillary: 217 mg/dL — ABNORMAL HIGH (ref 70–99)

## 2022-07-06 MED ORDER — TRAZODONE HCL 50 MG PO TABS: 25.0000 mg | ORAL_TABLET | Freq: Every evening | ORAL | 0 refills | Status: DC | PRN

## 2022-07-06 MED ORDER — EZETIMIBE 10 MG PO TABS: 10.0000 mg | ORAL_TABLET | Freq: Every day | ORAL | 2 refills | Status: DC

## 2022-07-06 MED ORDER — ATORVASTATIN CALCIUM 40 MG PO TABS: 40.0000 mg | ORAL_TABLET | Freq: Every day | ORAL | 0 refills | Status: DC

## 2022-07-06 MED ORDER — HYDROCODONE-ACETAMINOPHEN 5-325 MG PO TABS
1.0000 | ORAL_TABLET | Freq: Three times a day (TID) | ORAL | 0 refills | Status: DC | PRN
Start: 2022-07-06 — End: 2023-02-10

## 2022-07-06 MED ORDER — ISOSORBIDE DINITRATE 10 MG PO TABS: 10.0000 mg | ORAL_TABLET | Freq: Three times a day (TID) | ORAL | 1 refills | Status: DC

## 2022-07-06 MED ORDER — PANTOPRAZOLE SODIUM 40 MG PO TBEC: 40.0000 mg | DELAYED_RELEASE_TABLET | Freq: Every day | ORAL | 1 refills | Status: DC

## 2022-07-06 MED ORDER — SITAGLIP PHOS-METFORMIN HCL ER 100-1000 MG PO TB24: 1.0000 | ORAL_TABLET | Freq: Every day | ORAL | 2 refills | Status: DC

## 2022-07-06 MED ORDER — CLOPIDOGREL BISULFATE 75 MG PO TABS: 75.0000 mg | ORAL_TABLET | Freq: Every day | ORAL | 2 refills | Status: DC

## 2022-07-06 MED ORDER — TRAZODONE HCL 50 MG PO TABS
25.0000 mg | ORAL_TABLET | Freq: Every evening | ORAL | Status: DC | PRN
Start: 2022-07-06 — End: 2022-07-06

## 2022-07-06 MED ORDER — PREGABALIN 150 MG PO CAPS: ORAL_CAPSULE | ORAL | 0 refills | Status: DC

## 2022-07-06 NOTE — TOC Transition Note (Signed)
Transition of Care [facility]) - CM/SW Discharge Note   Patient Details  Name: TIMMEY LAMBA MRN: [id number] Date of Birth: 05-18-1946  Transition of Care [facility]) CM/SW Contact:  Izola Price, RN Phone Number: 07/06/2022, 1:52 PM   Clinical Narrative: 07/06/22: 145 pm DC Summary faxed to [facility] at [phone]. Fax did go through. Pending patient transport via EMS. Report to be called to LTC unit via main number at [phone]. Notified sister of pending discharge and transport to facility.   Simmie Davies RN CM      Final next level of care: [place] (Returning to [facility] LTC facility) Barriers to Discharge: Barriers Resolved   Patient Goals and CMS Choice        Discharge Placement              Patient chooses bed at: [place] (Returning to LTC at [facility].) Patient to be transferred to facility by: [facility] Name of family member notified: Julaine Fusi, [phone]. 150 pm 07/06/22    Discharge Plan and Services                DME Arranged: N/A DME Agency: NA       HH Arranged: NA HH Agency: NA        Social Determinants of Health (SDOH) Interventions     Readmission Risk Interventions     No data to display

## 2022-07-06 NOTE — Progress Notes (Signed)
MD order received in [facility] to discharge pt back to SNF/[facility] today; [facility]; telephone report called to [facility] [phone] and spoke with Stanton Kidney; [facility] to contact EMS for nonemergency transport for pt discharge; pt's discharge pending arrival of EMS

## 2022-07-06 NOTE — TOC Progression Note (Signed)
Transition of Care [facility]) - Progression Note    Patient Details  Name: [name] [name] MRN: [id number] Date of Birth: 1946-06-25  Transition of Care [facility]) CM/SW [place], LCSW Phone Number: 07/06/2022, 8:55 AM  Clinical Narrative:    Notified Deborah with [facility] that PT and OT did recommend patient get some rehab when he returns to [facility].  Notified assigned RN and TOC that patient can DC to [facility] today per [employer]. MD aware.        Expected Discharge Plan and Services                                                 Social Determinants of Health (SDOH) Interventions    Readmission Risk Interventions     No data to display

## 2022-07-06 NOTE — Progress Notes (Signed)
EMS present for pt discharge; discharge packet given to EMS personnel; pt discharged via stretcher by EMS back to [facility]

## 2022-07-06 NOTE — Plan of Care (Signed)
  Problem: Education: Goal: Ability to describe self-care measures that may prevent or decrease complications (Diabetes Survival Skills Education) will improve Outcome: Adequate for Discharge Goal: Individualized Educational Video(s) Outcome: Adequate for Discharge   Problem: Coping: Goal: Ability to adjust to condition or change in health will improve Outcome: Adequate for Discharge   Problem: Fluid Volume: Goal: Ability to maintain a balanced intake and output will improve Outcome: Adequate for Discharge   Problem: Health Behavior/Discharge Planning: Goal: Ability to identify and utilize available resources and services will improve Outcome: Adequate for Discharge Goal: Ability to manage health-related needs will improve Outcome: Adequate for Discharge   Problem: Metabolic: Goal: Ability to maintain appropriate glucose levels will improve Outcome: Adequate for Discharge   Problem: Nutritional: Goal: Maintenance of adequate nutrition will improve Outcome: Adequate for Discharge Goal: Progress toward achieving an optimal weight will improve Outcome: Adequate for Discharge   Problem: Skin Integrity: Goal: Risk for impaired skin integrity will decrease Outcome: Adequate for Discharge   Problem: Tissue Perfusion: Goal: Adequacy of tissue perfusion will improve Outcome: Adequate for Discharge   Problem: Education: Goal: Knowledge of General Education information will improve Description: Including pain rating scale, medication(s)/side effects and non-pharmacologic comfort measures Outcome: Adequate for Discharge   Problem: Health Behavior/Discharge Planning: Goal: Ability to manage health-related needs will improve Outcome: Adequate for Discharge   Problem: Clinical Measurements: Goal: Ability to maintain clinical measurements within normal limits will improve Outcome: Adequate for Discharge Goal: Will remain free from infection Outcome: Adequate for Discharge Goal:  Diagnostic test results will improve Outcome: Adequate for Discharge Goal: Respiratory complications will improve Outcome: Adequate for Discharge Goal: Cardiovascular complication will be avoided Outcome: Adequate for Discharge   Problem: Activity: Goal: Risk for activity intolerance will decrease Outcome: Adequate for Discharge   Problem: Nutrition: Goal: Adequate nutrition will be maintained Outcome: Adequate for Discharge   Problem: Coping: Goal: Level of anxiety will decrease Outcome: Adequate for Discharge   Problem: Elimination: Goal: Will not experience complications related to bowel motility Outcome: Adequate for Discharge Goal: Will not experience complications related to urinary retention Outcome: Adequate for Discharge   Problem: Pain Managment: Goal: General experience of comfort will improve Outcome: Adequate for Discharge   Problem: Safety: Goal: Ability to remain free from injury will improve Outcome: Adequate for Discharge   Problem: Skin Integrity: Goal: Risk for impaired skin integrity will decrease Outcome: Adequate for Discharge   Problem: Education: Goal: Knowledge of disease or condition will improve Outcome: Adequate for Discharge Goal: Knowledge of secondary prevention will improve (SELECT ALL) Outcome: Adequate for Discharge Goal: Knowledge of patient specific risk factors will improve (INDIVIDUALIZE FOR PATIENT) Outcome: Adequate for Discharge   Problem: Coping: Goal: Will verbalize positive feelings about self Outcome: Adequate for Discharge Goal: Will identify appropriate support needs Outcome: Adequate for Discharge   Problem: Health Behavior/Discharge Planning: Goal: Ability to manage health-related needs will improve Outcome: Adequate for Discharge   Problem: Self-Care: Goal: Ability to participate in self-care as condition permits will improve Outcome: Adequate for Discharge Goal: Verbalization of feelings and concerns over  difficulty with self-care will improve Outcome: Adequate for Discharge   Problem: Nutrition: Goal: Risk of aspiration will decrease Outcome: Adequate for Discharge   Problem: Ischemic Stroke/TIA Tissue Perfusion: Goal: Complications of ischemic stroke/TIA will be minimized Outcome: Adequate for Discharge

## 2022-07-06 NOTE — TOC Progression Note (Addendum)
Transition of Care [facility]) - Progression Note    Patient Details  Name: [name] [name] MRN: [id number] Date of Birth: 27-Oct-1946  Transition of Care [facility]) CM/SW [place], LCSW Phone Number: 07/06/2022, 8:36 AM  Clinical Narrative:    Call from Neoma Laming at [facility] who states patient is long term care, does not need PT/OT evals, and can come back tomorrow. TOC to fax DC Summary to [phone] tomorrow. Neoma Laming stated she has alerted facility staff to expect patient tomorrow Sunday 8/20.         Expected Discharge Plan and Services                                                 Social Determinants of Health (SDOH) Interventions    Readmission Risk Interventions     No data to display

## 2022-07-06 NOTE — Discharge Summary (Signed)
Physician Discharge Summary   Patient: [name] [name] MRN: [id number] DOB: 1946-02-07  Admit date:     07/04/2022  Discharge date: 07/06/22  Discharge Physician: Annita Brod   PCP: Idelle Crouch, MD   Recommendations at discharge:   New medication: Plavix 75 mg p.o. daily New medication: Zetia 10 mg p.o. daily Medication change: Patient will stop taking Nexium 40 mg daily and start Protonix 40 mg daily (Nexium has an interaction with Plavix) Medication change: Lyrica patient previously had been on 150 mg p.o. nightly and that is being increased to 150 mg p.o. twice daily x7 days then further increase to 300 mg p.o. twice daily and continue on this higher dosing Medication change: Metformin 1000 twice daily being discontinued.   New medication: Sitagliptin-metformin 100/1000 p.o. daily  Discharge Diagnoses: Principal Problem:   TIA (transient ischemic attack) Active Problems:   Uncontrolled diabetes mellitus with hyperglycemia, without long-term current use of insulin (HCC)   Cervical myelopathy with history of cervical discectomy and fusion (HCC)   OSA (obstructive sleep apnea)   Chronic diastolic CHF (congestive heart failure) (HCC)   Hypertension   COPD (chronic obstructive pulmonary disease) (HCC)   Mixed hyperlipidemia   Esophageal dysphagia   Hypothyroidism   Morbid obesity (HCC)   Abnormal MRI of head  Resolved Problems:   * No resolved hospital problems. *  Hospital Course: 76 year old male with past medical history of diabetes mellitus, hypertension, hypothyroidism, diastolic CHF, morbid obesity and COPD, who is nonambulatory after a cervical fusion which had residual myelopathy, who presented to the emergency department from his skilled nursing facility on the night of 8/18 with slurred speech.  This episode lasted only for a minute and then resolved, but then he had 2 more episodes which also resolved.  CT scan of the head was unremarkable.  Neurology was  consulted.  Patient was brought in for further evaluation and work-up for TIA.  MRI also negative for acute CVA.  Assessment and Plan: * TIA (transient ischemic attack) Dysarthric speech initially, but symptoms resolved.  Work-up noting unrevealing echo and carotid Dopplers.  MRI negative for CVA.  Likely small vessel disease.  LDL at goal although does have hypertriglyceridemia.  Diabetes mellitus is uncontrolled.  A1c greater than 10.  See below.  Cleared by speech therapy.  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  Allow for permissive hypertension for first 24-48 hours from TIA.   Plavix 300 mg load and continue at '75mg'$  daily .(Aspirin not started as patient has history of angioedema to ibuprofen)  Noted that patient has history of GERD and has been on Nexium.  Nexium has been discontinued and patient starting on Protonix.  Nexium has an interaction with Plavix, decreasing efficacy.  Uncontrolled diabetes mellitus with hyperglycemia, without long-term current use of insulin (HCC) Sliding scale insulin coverage.  A1c at 10.8.  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  Patient on metformin 1000 twice daily.  Attempted to put patient on metformin 2000 mg XR, however this requires prior authorization.  This can be done by his PCP in the interim, have stopped patient's metformin and started him on sitagliptin-metformin [phone] p.o. daily.  Recommend rechecking A1c in 3 months.  Patient needs aggressive control with target below 7.0  Patient also has a history of bad peripheral neuropathy which appears to be in part from his diabetes mellitus, but also in part from his cervical myelopathy.  He has been on Lyrica 150  nightly.  He has had no relief from this medication.  Increased to 150 p.o. twice daily x1 week and then further increase to 300 p.o. twice daily.  Cervical myelopathy with history of cervical discectomy and fusion (HCC) Nonambulatory  status No acute issues suspected.  Patient follows with neurosurgeon, Dr. Cari Caraway.  Last seen April 2023.  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  We will increase his Lyrica.  Discussed with patient and he amenable to this plan.  OSA (obstructive sleep apnea) CPAP nightly  Hypertension Continue Lasix and Isordil  Chronic diastolic CHF (congestive heart failure) (HCC) Currently compensated Continue home isosorbide and Lasix.  Echocardiogram done this hospitalization notes grade 1 diastolic dysfunction.  COPD (chronic obstructive pulmonary disease) (HCC) Not acutely exacerbated Continue home inhaler with as needed albuterol  Mixed hyperlipidemia On statin.  LDL controlled and at goal however with a low HDL and elevated triglycerides.  Started on Zetia  Esophageal dysphagia History of esophageal stricture with dilatation in 2021 Currently asymptomatic   Hypothyroidism Continue levothyroxine  Morbid obesity ([place]) Meets criteria with BMI greater than 35 and comorbidities of diabetes, hypertension        Pain control - [place] Controlled Substance Reporting System database was reviewed. and patient was instructed, not to drive, operate heavy machinery, perform activities at heights, swimming or participation in water activities or provide baby-sitting services while on Pain, Sleep and Anxiety Medications; until their outpatient Physician has advised to do so again. Also recommended to not to take more than prescribed Pain, Sleep and Anxiety Medications.  Consultants: Neurology Procedures performed: Echocardiogram noting grade 1 diastolic dysfunction Disposition: Skilled nursing facility Diet recommendation:  Discharge Diet Orders (From admission, onward)     Start     Ordered   07/06/22 0000  Diet - low sodium heart healthy        07/06/22 0932           Cardiac and Carb modified diet DISCHARGE MEDICATION: Allergies as of 07/06/2022        Reactions   Ibuprofen Other (See Comments)   Other reaction(s): Angioedema        Medication List     STOP taking these medications    esomeprazole 40 MG capsule Commonly known as: NEXIUM   metFORMIN 1000 MG tablet Commonly known as: GLUCOPHAGE   oxyCODONE 5 MG immediate release tablet Commonly known as: Oxy IR/ROXICODONE       TAKE these medications    albuterol 108 (90 Base) MCG/ACT inhaler Commonly known as: VENTOLIN HFA Inhale 2 puffs into the lungs every 6 (six) hours as needed for wheezing or shortness of breath.   atorvastatin 40 MG tablet Commonly known as: LIPITOR Take 1 tablet (40 mg total) by mouth daily.   Benzocaine-Menthol 15-3.6 MG Lozg Use as directed 1 lozenge in the mouth or throat every 4 (four) hours as needed (sore throat).   bisacodyl 10 MG suppository Commonly known as: DULCOLAX Place 10 mg rectally daily as needed for moderate constipation.   clopidogrel 75 MG tablet Commonly known as: PLAVIX Take 1 tablet (75 mg total) by mouth daily.   dicyclomine 10 MG capsule Commonly known as: BENTYL Take 10 mg by mouth 3 (three) times daily as needed (abdominal pain).   donepezil 10 MG tablet Commonly known as: ARICEPT Take 10 mg by mouth at bedtime.   DULoxetine 30 MG capsule Commonly known as: CYMBALTA Take 30 mg by mouth daily.   ergocalciferol 1.25 MG (50000 UT)  capsule Commonly known as: VITAMIN D2 Take 50,000 Units by mouth every Wednesday. Take every Monday and Wednesday   ezetimibe 10 MG tablet Commonly known as: ZETIA Take 1 tablet (10 mg total) by mouth daily.   FLUoxetine 40 MG capsule Commonly known as: PROZAC Take 40 mg by mouth daily.   fluticasone-salmeterol 250-50 MCG/ACT Aepb Commonly known as: ADVAIR Inhale 1 puff into the lungs in the morning and at bedtime.   furosemide 40 MG tablet Commonly known as: LASIX Take 40 mg by mouth 2 (two) times daily.   HYDROcodone-acetaminophen 5-325 MG tablet Commonly  known as: NORCO/VICODIN Take 1 tablet by mouth every 8 (eight) hours as needed.   hydrocortisone 2.5 % cream Apply 1 application topically every 12 (twelve) hours as needed (per rectum, hemorrhoids).   isosorbide dinitrate 10 MG tablet Commonly known as: ISORDIL Take 1 tablet (10 mg total) by mouth 3 (three) times daily.   ketoconazole 2 % shampoo Commonly known as: NIZORAL Apply topically once a week.   levothyroxine 200 MCG tablet Commonly known as: SYNTHROID Take 200 mcg by mouth daily before breakfast.   Linzess 290 MCG Caps capsule Generic drug: linaclotide Take 290 mcg by mouth daily.   loratadine 10 MG tablet Commonly known as: CLARITIN Take 10 mg by mouth daily as needed for allergies or itching.   magnesium oxide 400 MG tablet Commonly known as: MAG-OX Take 400 mg by mouth 2 (two) times daily.   melatonin 5 MG Tabs Take 5 mg by mouth at bedtime.   midodrine 5 MG tablet Commonly known as: PROAMATINE Take 5 mg by mouth daily.   nystatin powder Generic drug: nystatin Apply topically.   pantoprazole 40 MG tablet Commonly known as: PROTONIX Take 1 tablet (40 mg total) by mouth daily. Start taking on: July 07, 2022   potassium chloride SA 20 MEQ tablet Commonly known as: KLOR-CON M Take 20 mEq by mouth 3 (three) times daily with meals.   pregabalin 150 MG capsule Commonly known as: LYRICA Take 1 capsule (150 mg total) by mouth 2 (two) times daily for 7 days, THEN 2 capsules (300 mg total) 2 (two) times daily. Start taking on: July 06, 2022 What changed: See the new instructions.   senna-docusate 8.6-50 MG tablet Commonly known as: Senokot-S Take 1 tablet by mouth 2 (two) times daily.   simethicone 125 MG chewable tablet Commonly known as: MYLICON Chew 093 mg by mouth 2 (two) times daily.   SitaGLIPtin-MetFORMIN HCl [phone] MG Tb24 Take 1 tablet by mouth daily.   traZODone 50 MG tablet Commonly known as: DESYREL Take 0.5 tablets (25 mg  total) by mouth at bedtime as needed for sleep. What changed:  medication strength how much to take when to take this reasons to take this   verapamil 180 MG CR tablet Commonly known as: CALAN-SR Take 180 mg by mouth daily as needed. BP =>150/90        Discharge Exam: Filed Weights   07/04/22 2325  Weight: 131.1 kg   General: Alert and oriented x3, no acute distress Cardiovascular: Regular rate and rhythm, S1-S2 Lungs: Clear to auscultation bilaterally  Condition at discharge: good  The results of significant diagnostics from this hospitalization (including imaging, microbiology, ancillary and laboratory) are listed below for reference.   Imaging Studies: DG Chest Port 1 View  Result Date: 07/05/2022 CLINICAL DATA:  Slurred speech EXAM: PORTABLE CHEST 1 VIEW COMPARISON:  11/22/2020 FINDINGS: Transverse diameter of heart is increased. Central pulmonary vessels are  prominent. There are no signs of alveolar pulmonary edema. There is a small left pleural effusion. There is pleural density in the lateral aspect of right lower lung field with no significant change. Deformities are noted in the lateral aspect of right sixth and seventh ribs suggesting old fractures. There is increased density in medial right lower lung field. There is no pneumothorax. IMPRESSION: Cardiomegaly. Central pulmonary vessels are prominent without signs of alveolar pulmonary edema. Small left pleural effusion. Pleural density in the lateral aspect of right lower lung field may suggest pleural thickening. Subtle increased density in medial right lower lung field may suggest scarring or subsegmental atelectasis. Electronically Signed   By: Elmer Picker M.D.   On: 07/05/2022 14:28   ECHOCARDIOGRAM COMPLETE  Result Date: 07/05/2022    ECHOCARDIOGRAM REPORT   Patient Name:   [name] [name] Geimer Date of Exam: 07/05/2022 Medical Rec #:  [id number]     Height:       74.0 in Accession #:    [id number]    Weight:        289.1 lb Date of Birth:  11-15-1946      BSA:          2.542 m Patient Age:    54 years      BP:           162/83 mmHg Patient Gender: M             HR:           55 bpm. Exam Location:  [facility] Procedure: 2D Echo Indications:     Stroke I63.9  History:         Patient has prior history of Echocardiogram examinations, most                  recent 11/27/2018.  Sonographer:     Kathlen Brunswick RDCS Referring Phys:  [id number] Athena Masse Diagnosing Phys: Isaias Cowman MD  Sonographer Comments: Technically difficult study due to poor echo windows. Image acquisition challenging due to patient body habitus. IMPRESSIONS  1. Left ventricular ejection fraction, by estimation, is 50 to 55%. The left ventricle has low normal function. The left ventricle has no regional wall motion abnormalities. Left ventricular diastolic parameters are consistent with Grade I diastolic dysfunction (impaired relaxation).  2. Right ventricular systolic function is normal. The right ventricular size is normal.  3. The mitral valve is normal in structure. Mild mitral valve regurgitation. No evidence of mitral stenosis.  4. The aortic valve is normal in structure. Aortic valve regurgitation is not visualized. No aortic stenosis is present.  5. The inferior vena cava is normal in size with greater than 50% respiratory variability, suggesting right atrial pressure of 3 mmHg. FINDINGS  Left Ventricle: Left ventricular ejection fraction, by estimation, is 50 to 55%. The left ventricle has low normal function. The left ventricle has no regional wall motion abnormalities. The left ventricular internal cavity size was normal in size. There is no left ventricular hypertrophy. Left ventricular diastolic parameters are consistent with Grade I diastolic dysfunction (impaired relaxation). Right Ventricle: The right ventricular size is normal. No increase in right ventricular wall thickness. Right ventricular systolic function is normal. Left Atrium: Left  atrial size was normal in size. Right Atrium: Right atrial size was normal in size. Pericardium: There is no evidence of pericardial effusion. Mitral Valve: The mitral valve is normal in structure. Mild mitral valve regurgitation. No evidence of mitral valve stenosis. Tricuspid Valve: The tricuspid  valve is normal in structure. Tricuspid valve regurgitation is mild . No evidence of tricuspid stenosis. Aortic Valve: The aortic valve is normal in structure. Aortic valve regurgitation is not visualized. No aortic stenosis is present. Aortic valve peak gradient measures 10.8 mmHg. Pulmonic Valve: The pulmonic valve was normal in structure. Pulmonic valve regurgitation is not visualized. No evidence of pulmonic stenosis. Aorta: The aortic root is normal in size and structure. Venous: The inferior vena cava is normal in size with greater than 50% respiratory variability, suggesting right atrial pressure of 3 mmHg. IAS/Shunts: No atrial level shunt detected by color flow Doppler.  LEFT VENTRICLE PLAX 2D LVIDd:         5.44 cm     Diastology LVIDs:         3.78 cm     LV e' medial:    4.57 cm/s LV PW:         1.27 cm     LV E/e' medial:  11.5 LV IVS:        1.26 cm     LV e' lateral:   4.57 cm/s LVOT diam:     2.40 cm     LV E/e' lateral: 11.5 LV SV:         106 LV SV Index:   42 LVOT Area:     4.52 cm  LV Volumes (MOD) LV vol d, MOD A2C: 86.2 ml LV vol d, MOD A4C: 82.4 ml LV vol s, MOD A2C: 33.3 ml LV vol s, MOD A4C: 35.7 ml LV SV MOD A2C:     52.9 ml LV SV MOD A4C:     82.4 ml LV SV MOD BP:      48.0 ml RIGHT VENTRICLE RV S prime:     12.70 cm/s LEFT ATRIUM             Index        RIGHT ATRIUM           Index LA diam:        3.00 cm 1.18 cm/m   RA Area:     17.50 cm LA Vol (A2C):   50.1 ml 19.71 ml/m  RA Volume:   45.50 ml  17.90 ml/m LA Vol (A4C):   27.3 ml 10.74 ml/m LA Biplane Vol: 38.8 ml 15.26 ml/m  AORTIC VALVE AV Area (Vmax): 2.92 cm AV Vmax:        164.00 cm/s AV Peak Grad:   10.8 mmHg LVOT Vmax:       106.00 cm/s LVOT Vmean:     69.500 cm/s LVOT VTI:       0.235 m  AORTA Ao Root diam: 3.30 cm Ao Asc diam:  3.50 cm MITRAL VALVE MV Area (PHT): 2.22 cm    SHUNTS MV Decel Time: 341 msec    Systemic VTI:  0.24 m MV E velocity: 52.70 cm/s  Systemic Diam: 2.40 cm MV A velocity: 74.10 cm/s MV E/A ratio:  0.71 Isaias Cowman MD Electronically signed by Isaias Cowman MD Signature Date/Time: 07/05/2022/1:25:39 PM    Final    US Carotid Bilateral (at [facility] and [facility] only)  Result Date: 07/05/2022 CLINICAL DATA:  76 year old male with aphasia, TIA. EXAM: BILATERAL CAROTID DUPLEX ULTRASOUND TECHNIQUE: Pearline Cables scale imaging, color Doppler and duplex ultrasound were performed of bilateral carotid and vertebral arteries in the neck. COMPARISON:  Brain MRI 0221 hours today. FINDINGS: Criteria: Quantification of carotid stenosis is based on velocity parameters that correlate the residual internal carotid  diameter with NASCET-based stenosis levels, using the diameter of the distal internal carotid lumen as the denominator for stenosis measurement. The following velocity measurements were obtained: RIGHT ICA: 65 cm/sec CCA: 99 cm/sec SYSTOLIC ICA/CCA RATIO:  1.1 ECA: 148 cm/sec LEFT ICA: 52 cm/sec CCA: 58 cm/sec SYSTOLIC ICA/CCA RATIO:  0.9 ECA: 126 cm/sec RIGHT CAROTID ARTERY: Suboptimal grayscale visualization. No bulky atherosclerosis is evident. RIGHT VERTEBRAL ARTERY: Suboptimal grayscale visualization. No bulky atherosclerosis is evident. LEFT CAROTID ARTERY:  Patent with antegrade flow in the lower neck. LEFT VERTEBRAL ARTERY: Patent with antegrade flow in the lower neck. IMPRESSION: Suboptimal grayscale Ultrasound demonstration of the bilateral carotid arteries. But no evidence of cervical carotid stenosis by Doppler criteria. Electronically Signed   By: Genevie Ann M.D.   On: 07/05/2022 05:59   MR BRAIN WO CONTRAST  Result Date: 07/05/2022 CLINICAL DATA:  Aphasia, TIA EXAM: MRI HEAD WITHOUT CONTRAST TECHNIQUE:  Multiplanar, multiecho pulse sequences of the brain and surrounding structures were obtained without intravenous contrast. COMPARISON:  No prior MRI, correlation is made with CT head 07/04/2022 FINDINGS: Brain: No restricted diffusion to suggest acute or subacute infarct. No acute hemorrhage, mass, mass effect, or midline shift. No hydrocephalus or extra-axial collection. No hemosiderin deposition to suggest remote hemorrhage. Scattered T2 hyperintense signal in the periventricular [place] matter, likely the sequela of mild chronic small vessel ischemic disease. Vascular: Normal arterial flow voids. Skull and upper cervical spine: Normal marrow signal. Sinuses/Orbits: Air-fluid level and bubbly fluid in the left sphenoid sinus and possibly ethmoid air cells. Mucosal thickening in the left maxillary sinus and other ethmoid air cells. Status post bilateral lens replacements. Other: Fluid in the mastoid air cells. IMPRESSION: 1. No acute intracranial process. No evidence of acute or subacute infarct. 2. Air-fluid level with bubbly fluid in the left sphenoid sinus, as can be seen with acute sinusitis. Correlate with symptoms. Electronically Signed   By: Merilyn Baba M.D.   On: 07/05/2022 03:04   CT HEAD CODE STROKE WO CONTRAST  Result Date: 07/04/2022 CLINICAL DATA:  Code stroke.  Slurred speech, numbness EXAM: CT HEAD WITHOUT CONTRAST TECHNIQUE: Contiguous axial images were obtained from the base of the skull through the vertex without intravenous contrast. RADIATION DOSE REDUCTION: This exam was performed according to the departmental dose-optimization program which includes automated exposure control, adjustment of the mA and/or kV according to patient size and/or use of iterative reconstruction technique. COMPARISON:  09/05/2019 FINDINGS: Brain: No evidence of acute infarction, hemorrhage, cerebral edema, mass, mass effect, or midline shift. No hydrocephalus or extra-axial collection. Vascular: No hyperdense  vessel. Skull: Negative for fracture or focal lesion. Sinuses/Orbits: Mucosal thickening and bubbly fluid in the ethmoid air cells. Air-fluid level and bubbly fluid in the left sphenoid sinus. Mild mucosal thickening in the maxillary sinuses. Other: Fluid in the bilateral mastoid air cells. ASPECTS [facility] Stroke Program Early CT Score) - Ganglionic level infarction (caudate, lentiform nuclei, internal capsule, insula, M1-M3 cortex): 7 - Supraganglionic infarction (M4-M6 cortex): 3 Total score (0-10 with 10 being normal): 10 IMPRESSION: 1. No acute intracranial process. 2. ASPECTS is 10 3. Air-fluid level with bubbly fluid in the ethmoid air cells and left sphenoid sinus, as can be seen with acute sinusitis. Correlate with symptoms. Code stroke imaging results were communicated on 07/04/2022 at 11:47 pm to provider SUNG via telephone, who verbally acknowledged these results. Electronically Signed   By: Merilyn Baba M.D.   On: 07/04/2022 23:50    Microbiology: Results for orders placed or performed  during the hospital encounter of 07/04/22  Resp Panel by RT-PCR (Flu A&B, Covid) Anterior Nasal Swab     Status: None   Collection Time: 07/04/22 11:34 PM   Specimen: Anterior Nasal Swab  Result Value Ref Range Status   SARS Coronavirus 2 by RT PCR NEGATIVE NEGATIVE Final    Comment: (NOTE) SARS-CoV-2 target nucleic acids are NOT DETECTED.  The SARS-CoV-2 RNA is generally detectable in upper respiratory specimens during the acute phase of infection. The lowest concentration of SARS-CoV-2 viral copies this assay can detect is 138 copies/mL. A negative result does not preclude SARS-Cov-2 infection and should not be used as the sole basis for treatment or other patient management decisions. A negative result may occur with  improper specimen collection/handling, submission of specimen other than nasopharyngeal swab, presence of viral mutation(s) within the areas targeted by this assay, and inadequate  number of viral copies(<138 copies/mL). A negative result must be combined with clinical observations, patient history, and epidemiological information. The expected result is Negative.  Fact Sheet for Patients:  EntrepreneurPulse.com.au  Fact Sheet for Healthcare Providers:  IncredibleEmployment.be  This test is no t yet approved or cleared by the [place] FDA and  has been authorized for detection and/or diagnosis of SARS-CoV-2 by FDA under an Emergency Use Authorization (EUA). This EUA will remain  in effect (meaning this test can be used) for the duration of the COVID-19 declaration under Section 564(b)(1) of the Act, 21 U.S.C.section 360bbb-3(b)(1), unless the authorization is terminated  or revoked sooner.       Influenza A by PCR NEGATIVE NEGATIVE Final   Influenza B by PCR NEGATIVE NEGATIVE Final    Comment: (NOTE) The Xpert Xpress SARS-CoV-2/FLU/RSV plus assay is intended as an aid in the diagnosis of influenza from Nasopharyngeal swab specimens and should not be used as a sole basis for treatment. Nasal washings and aspirates are unacceptable for Xpert Xpress SARS-CoV-2/FLU/RSV testing.  Fact Sheet for Patients: EntrepreneurPulse.com.au  Fact Sheet for Healthcare Providers: IncredibleEmployment.be  This test is not yet approved or cleared by the [place] FDA and has been authorized for detection and/or diagnosis of SARS-CoV-2 by FDA under an Emergency Use Authorization (EUA). This EUA will remain in effect (meaning this test can be used) for the duration of the COVID-19 declaration under Section 564(b)(1) of the Act, 21 U.S.C. section 360bbb-3(b)(1), unless the authorization is terminated or revoked.  Performed at [facility], [address] [place], [place] [address]     Labs: CBC: Recent Labs  Lab 07/04/22 2330  WBC 8.2  NEUTROABS 5.1  HGB 13.3  HCT 41.5   MCV 87.4  PLT 563   Basic Metabolic Panel: Recent Labs  Lab 07/04/22 2330  NA 138  K 3.6  CL 104  CO2 25  GLUCOSE 213*  BUN 18  CREATININE 1.03  CALCIUM 8.9   Liver Function Tests: Recent Labs  Lab 07/04/22 2330  AST 22  ALT 25  ALKPHOS 81  BILITOT 0.7  PROT 6.3*  ALBUMIN 3.4*   CBG: Recent Labs  Lab 07/05/22 1210 07/05/22 1723 07/05/22 2118 07/06/22 0809 07/06/22 1140  GLUCAP 223* 184* 188* 195* 217*    Discharge time spent: less than 30 minutes.  Signed: Annita Brod, MD [facility] 07/06/2022

## 2022-07-06 NOTE — NC FL2 (Signed)
[place] LEVEL OF CARE SCREENING TOOL     IDENTIFICATION  Patient Name: [name] [name] Birthdate: 22-Oct-1946 Sex: male Admission Date (Current Location): 07/04/2022  [facility] and [place] Number:  Engineering geologist and Address:  [facility], [address] [place], [place] [address]      Provider Number: [id number]  Attending Physician Name and Address:  [name] Brod, MD  Relative Name and Phone Number:  [name] [name] (Sister)   [phone] [facility] Phone)    Current Level of Care: Hospital Recommended Level of Care: [facility] Prior Approval Number:    Date Approved/Denied:   PASRR Number: [id number]  Discharge Plan:      Current Diagnoses: Patient Active Problem List   Diagnosis Date Noted   TIA (transient ischemic attack) 07/05/2022   Abnormal MRI of head 07/05/2022   Uncontrolled diabetes mellitus with hyperglycemia, without long-term current use of insulin ([place])    Morbid obesity ([place])    Cervical myelopathy with history of cervical discectomy and fusion ([place])    Esophageal dysphagia 11/18/2020   Elevated BUN 11/18/2020   AKI (acute kidney injury) ([place]) 11/18/2020   HCAP (healthcare-associated pneumonia)    Mucus plugging of bronchi    Left cervical radiculopathy 10/26/2020   Acute cervical radiculopathy 10/26/2020   Pressure injury of skin 10/26/2020   Problems with swallowing and mastication    Stricture and stenosis of esophagus    Hx of colonic polyps    Polyp of ascending colon    Bilateral carpal tunnel syndrome 02/13/2020   Neurocardiogenic syncope 03/02/2019   Lymphedema 02/04/2019   Tobacco use 02/03/2019   CHF (congestive heart failure) ([place]) 12/23/2018   Chronic diastolic CHF (congestive heart failure) ([place]) 11/27/2018   Cellulitis 11/27/2018   Hypothyroidism 11/27/2018   OSA (obstructive sleep apnea) 11/27/2018   Hypertension 11/27/2018   Mixed hyperlipidemia 11/27/2018    Protein-calorie malnutrition ([place]) 03/02/2018   Neuropathy 03/02/2018   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   Difficulty in voiding 03/01/2018   Vitamin D deficiency 03/01/2018   Vitamin B12 deficiency 03/01/2018   Insomnia 03/01/2018   COPD (chronic obstructive pulmonary disease) ([place]) 02/11/2018   Pain due to onychomycosis of nail 02/11/2018   Onychomycosis 02/11/2018   Traumatic ecchymosis of foot, right, initial encounter 02/11/2018   Xerostomia 02/11/2018   Frequent falls 02/11/2018   Acute respiratory failure ([place]) 02/07/2018   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   History of thyroid cancer 05/05/2014    Orientation RESPIRATION BLADDER Height & Weight     Self, Time, Situation, Place  Normal Incontinent Weight: 289 lb 1.6 oz (131.1 kg) Height:  6' 2.02" (188 cm)  BEHAVIORAL SYMPTOMS/MOOD NEUROLOGICAL BOWEL NUTRITION STATUS        Diet (heart healthy/carb modified)  AMBULATORY STATUS COMMUNICATION OF NEEDS Skin   Limited Assist   Normal                       Personal Care Assistance Level of Assistance  Bathing, Feeding, Dressing Bathing Assistance: Limited assistance Feeding assistance: Limited assistance Dressing Assistance: Limited assistance     Functional Limitations Info             SPECIAL CARE FACTORS FREQUENCY  PT (By licensed PT), OT (By licensed OT)     PT Frequency: 5 times per week OT Frequency: 5 times per week            Contractures  Additional Factors Info  Code Status, Allergies Code Status Info: full Allergies Info: ibuprofen           Current Medications (07/06/2022):  This is the current hospital active medication list Current Facility-Administered Medications  Medication Dose Route Frequency Provider Last Rate Last Admin    stroke: early stages of recovery book   Does not apply Once Athena Masse, MD       acetaminophen (TYLENOL) tablet 650 mg  650 mg Oral Q4H PRN Athena Masse, MD        Or   acetaminophen (TYLENOL) 160 MG/5ML solution 650 mg  650 mg Per Tube Q4H PRN Athena Masse, MD       Or   acetaminophen (TYLENOL) suppository 650 mg  650 mg Rectal Q4H PRN Athena Masse, MD       atorvastatin (LIPITOR) tablet 40 mg  40 mg Oral Daily Judd Gaudier V, MD   40 mg at 07/05/22 1000   clopidogrel (PLAVIX) tablet 75 mg  75 mg Oral Daily Athena Masse, MD       diphenhydrAMINE (BENADRYL) capsule 25 mg  25 mg Oral Q6H PRN [name] Brod, MD   25 mg at 07/06/22 0027   donepezil (ARICEPT) tablet 10 mg  10 mg Oral QHS [name] Brod, MD   10 mg at 07/05/22 2104   DULoxetine (CYMBALTA) DR capsule 30 mg  30 mg Oral Daily [name] Brod, MD   30 mg at 07/05/22 1733   enoxaparin (LOVENOX) injection 65 mg  0.5 mg/kg Subcutaneous Q24H Athena Masse, MD   65 mg at 07/05/22 2105   ezetimibe (ZETIA) tablet 10 mg  10 mg Oral Daily [name] Brod, MD       FLUoxetine (PROZAC) capsule 40 mg  40 mg Oral Daily [name] Brod, MD   40 mg at 07/05/22 1733   insulin aspart (novoLOG) injection 0-20 Units  0-20 Units Subcutaneous TID WC Athena Masse, MD   4 Units at 07/05/22 1731   insulin aspart (novoLOG) injection 0-5 Units  0-5 Units Subcutaneous QHS Judd Gaudier V, MD       isosorbide dinitrate (ISORDIL) tablet 10 mg  10 mg Oral TID Athena Masse, MD   10 mg at 07/05/22 2104   levothyroxine (SYNTHROID) tablet 200 mcg  200 mcg Oral Q0600 Judd Gaudier V, MD   200 mcg at 07/06/22 0526   magnesium oxide (MAG-OX) tablet 400 mg  400 mg Oral BID [name] Brod, MD   400 mg at 07/05/22 2104   melatonin tablet 5 mg  5 mg Oral QHS [name] Brod, MD   5 mg at 07/05/22 2104   [START ON 07/27/2022] mometasone-formoterol (DULERA) 200-5 MCG/ACT inhaler 2 puff  2 puff Inhalation BID [name] Brod, MD       Oral care mouth rinse  15 mL Mouth Rinse PRN [name] Brod, MD       oxyCODONE (Oxy IR/ROXICODONE) immediate release tablet 5-10 mg  5-10 mg Oral Q4H PRN Athena Masse, MD   5 mg at 07/05/22 2007   pantoprazole (PROTONIX) EC tablet 40 mg  40 mg Oral Daily [name] Brod, MD   40 mg at 07/05/22 1231   pregabalin (LYRICA) capsule 150 mg  150 mg Oral BID [name] Brod, MD   150 mg at 07/05/22 2104   traZODone (DESYREL) tablet 25 mg  25 mg Oral QHS PRN Sharion Settler, NP  Discharge Medications: Please see discharge summary for a list of discharge medications.  Relevant Imaging Results:  Relevant Lab Results:   Additional Information SS #: [id number]  DQOYPO D [id number], LCSW

## 2022-07-08 NOTE — Consult Note (Signed)
[facility] TeleNeurology Consult Services   Patient Name:   [name] [name], [name] [name] Date of Birth:   [birth year] Identification Number:   MRN - [id number] Date of Service:   07/04/2022 23:48:01  Diagnosis:       R47.81 - Slurred speech  Impression:      The patient is a 76 year old man who developed severe dysarthria at 22:00 now resolved so not a thrombolytic candidate. Concern is for tia. Recommend he be started on asa 325 and admitted for stroke workup and inpatient neurology consultation.  Our recommendations are outlined below.  Recommendations:        Stroke/Telemetry Floor       Neuro Checks       Bedside Swallow Eval       DVT Prophylaxis       IV Fluids, Normal Saline       Head of Bed 30 Degrees       Euglycemia and Avoid Hyperthermia (PRN Acetaminophen)       Initiate or continue Aspirin 325 MG daily   Sign Out:       Discussed with Emergency Department Provider    ------------------------------------------------------------------------------  Advanced Imaging: Advanced Imaging Deferred because:  Non-disabling symptoms as verified by the patient; no cortical signs so not consistent with LVO   Metrics: Last Known Well: 07/04/2022 22:00:00 [facility] Notification Time: 07/04/2022 23:48:01 Arrival Time: 07/04/2022 23:40:00 Stamp Time: 07/04/2022 23:48:01 Initial Response Time: 07/04/2022 23:51:27 Symptoms: slurred speech. Initial patient interaction: 07/04/2022 23:55:00 NIHSS Assessment Completed: 07/04/2022 23:58:00 Patient is not a candidate for Thrombolytic. Thrombolytic Medical Decision: 07/05/2022 00:00:00 Patient was not deemed candidate for Thrombolytic because of following reasons: Resolved symptoms (no residual disabling symptoms).  CT head showed no acute hemorrhage or acute core infarct.  Primary Provider Notified of Diagnostic Impression and Management Plan on: 07/05/2022  00:14:29    ------------------------------------------------------------------------------  History of Present Illness: Patient is a 76 year old Male.  Patient was brought by EMS for symptoms of slurred speech.   The patient is a 76 year old man with a history of HTN, HLD and hypothyroidism who has had a diarrheal illness the past 3-4 days. Today at 22:00 he developed severe dysarthria that has since resolved. There was no other weakness numbness or tingling. No HA or CP. He does not take any blood thinners.   Past Medical History:      Hypertension      Hyperlipidemia      There is no history of Stroke [place] PMH:  Back surgery, OSA, COPD  Medications:  No Anticoagulant use  No Antiplatelet use Reviewed EMR for current medications  Allergies:  Reviewed  Social History: Drug Use: No  Family History:  There is no family history of premature cerebrovascular disease pertinent to this consultation  ROS : 14 Points Review of Systems was performed and was negative except mentioned in HPI.  Past Surgical History: There Is No Surgical History Contributory To Today's Visit    Examination: BP(150/70), Pulse(54), 1A: Level of Consciousness - Alert; keenly responsive + 0 1B: Ask Month and Age - Both Questions Right + 0 1C: Blink Eyes & Squeeze Hands - Performs Both Tasks + 0 2: Test Horizontal Extraocular Movements - Normal + 0 3: Test Visual Fields - No Visual Loss + 0 4: Test Facial Palsy (Use Grimace if Obtunded) - Normal symmetry + 0 5A: Test Left Arm Motor Drift - No Drift for 10 Seconds + 0 5B: Test Right Arm Motor Drift -  No Drift for 10 Seconds + 0 6A: Test Left Leg Motor Drift - No Drift for 5 Seconds + 0 6B: Test Right Leg Motor Drift - No Drift for 5 Seconds + 0 7: Test Limb Ataxia (FNF/Heel-Shin) - No Ataxia + 0 8: Test Sensation - Normal; No sensory loss + 0 9: Test Language/Aphasia - Normal; No aphasia + 0 10: Test Dysarthria - Normal + 0 11: Test  Extinction/Inattention - No abnormality + 0  NIHSS Score: 0  NIHSS Free Text : very subtle intermittent dysarthria  Pre-Morbid Modified Rankin Scale: 2 Points = Slight disability; unable to carry out all previous activities, but able to look after own affairs without assistance  Spoke with : attending ed md  Patient/Family was informed the Neurology Consult would occur via TeleHealth consult by way of interactive audio and video telecommunications and consented to receiving care in this manner.   Patient is being evaluated for possible acute neurologic impairment and high probability of imminent or life-threatening deterioration. I spent total of 35 minutes providing care to this patient, including time for face to face visit via telemedicine, review of medical records, imaging studies and discussion of findings with providers, the patient and/or family.   Dr Katina Degree   [facility] For Inpatient follow-up with [facility] physician please call [facility] [phone]. This is not an outpatient service. Post hospital discharge, please contact hospital directly.

## 2022-07-15 IMAGING — MR MR LUMBAR SPINE W/O CM
5 series · 31 of 48 positions shown · non-contrast
Comparison: None.

CLINICAL DATA: Frequent falls. Sensory neuropathy. Lumbar surgery 1
year ago. Chronic low back pain.

EXAM:
MRI LUMBAR SPINE WITHOUT CONTRAST
TECHNIQUE: Multiplanar, multisequence MR imaging of the lumbar spine was
performed. No intravenous contrast was administered.

[Series 5: T2 · sagittal · 4.0mm · 0.81mm/px · 6 of 17 slices shown (1 of 2)]
[im 1/17]
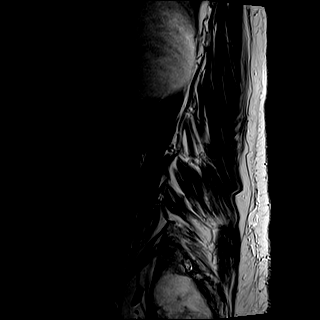
[im 4/17]
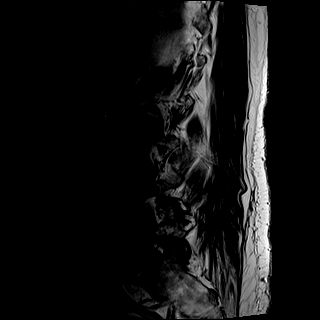
[im 7/17]
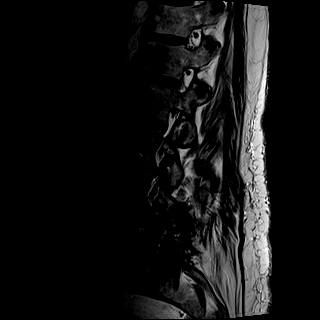
[im 10/17]
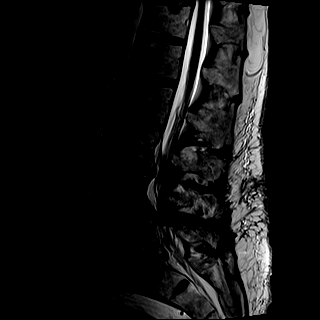
[im 13/17]
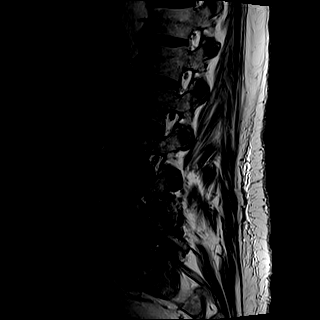
[im 17/17]
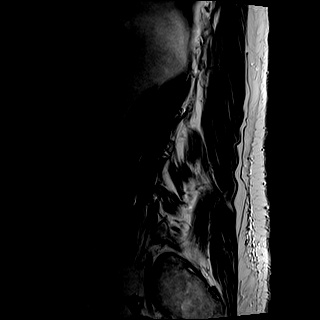

[Series 6: T1 · sagittal · 4.0mm · 0.81mm/px · 6 of 17 slices shown (1 of 2)]
[im 1/17]
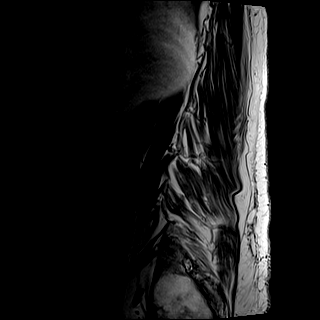
[im 4/17]
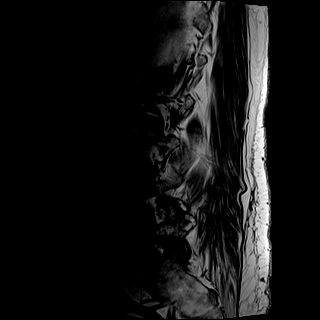
[im 7/17]
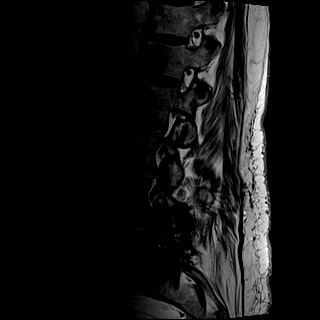
[im 10/17]
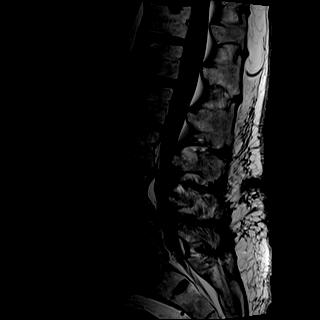
[im 13/17]
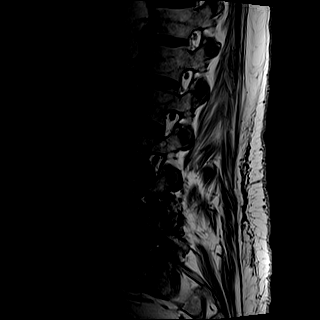
[im 17/17]
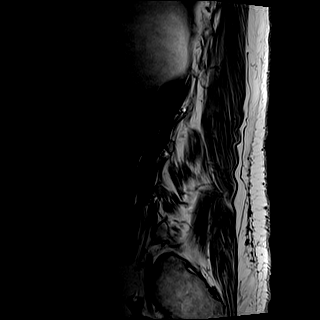

[Series 7: STIR · sagittal · 4.0mm · 0.41mm/px · 1 of 17 slices shown]
[im 1/17]
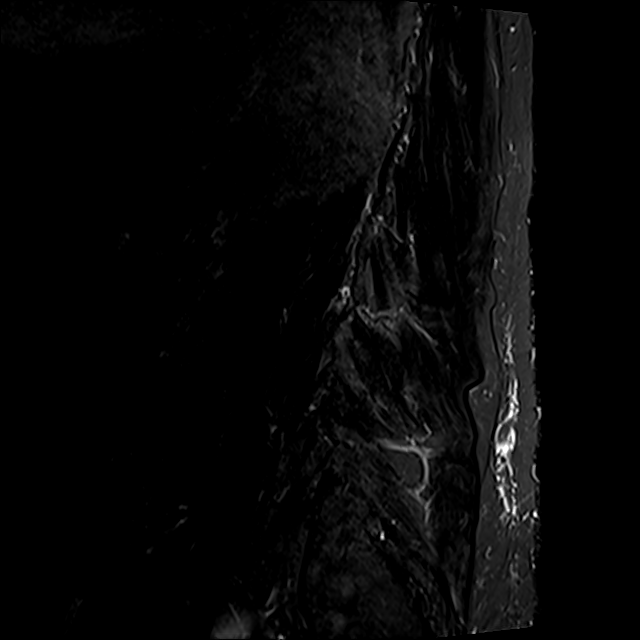

[Series 8: T2 · axial · 4.0mm · 0.78mm/px · z∈[-55,+174]mm · 9 of 41 slices shown (2 of 2)]
[im 1/41]
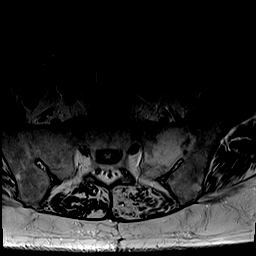
[im 6/41]
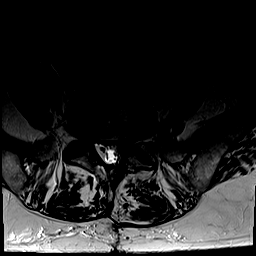
[im 12/41]
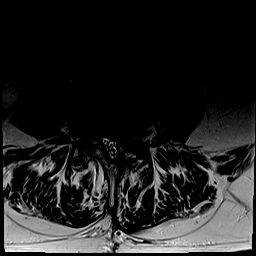
[im 18/41]
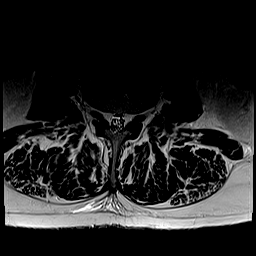
[im 21/41]
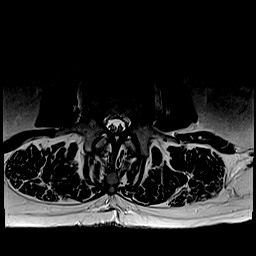
[im 23/41]
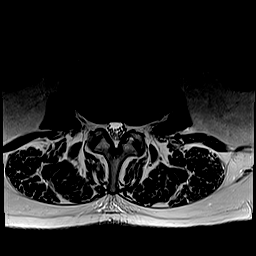
[im 29/41]
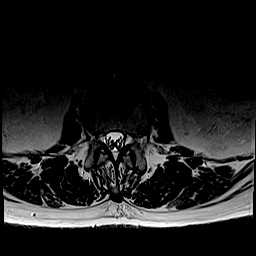
[im 35/41]
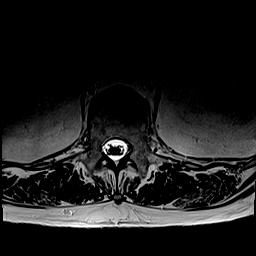
[im 41/41]
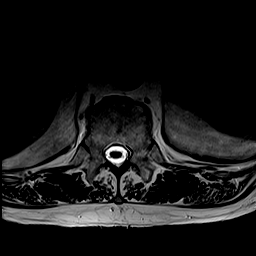

[Series 9: T1 · axial · 4.0mm · 0.39mm/px · z∈[-55,+174]mm · 9 of 41 slices shown (2 of 2)]
[im 1/41]
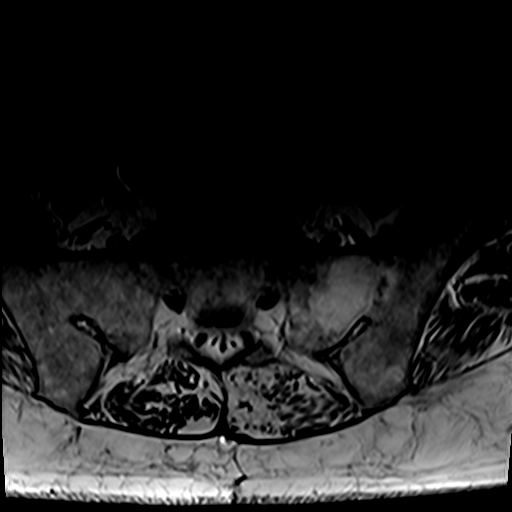
[im 6/41]
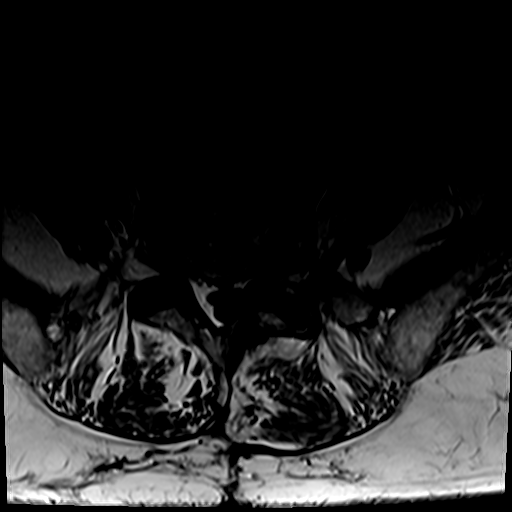
[im 12/41]
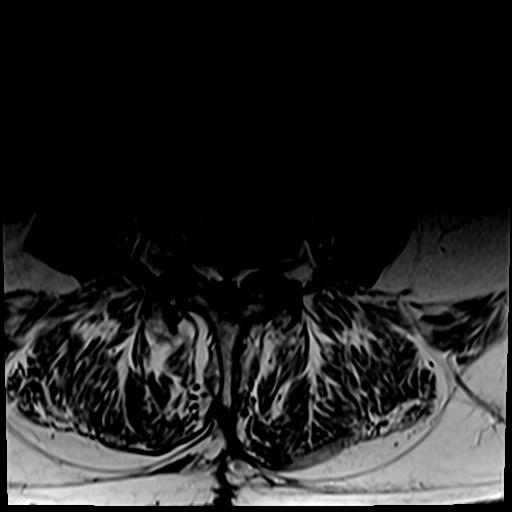
[im 18/41]
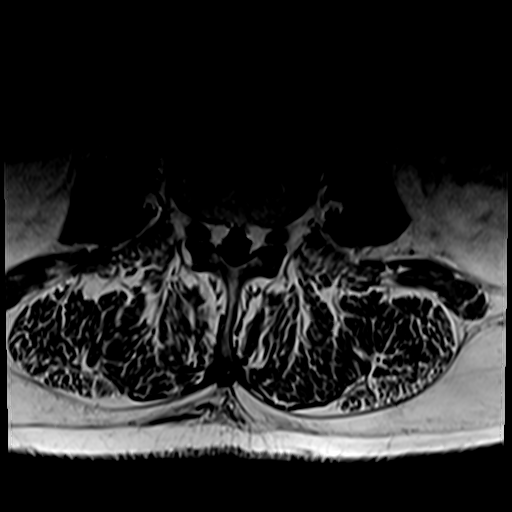
[im 21/41]
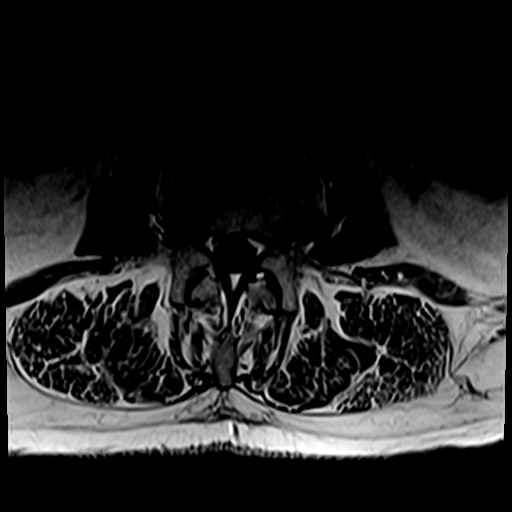
[im 23/41]
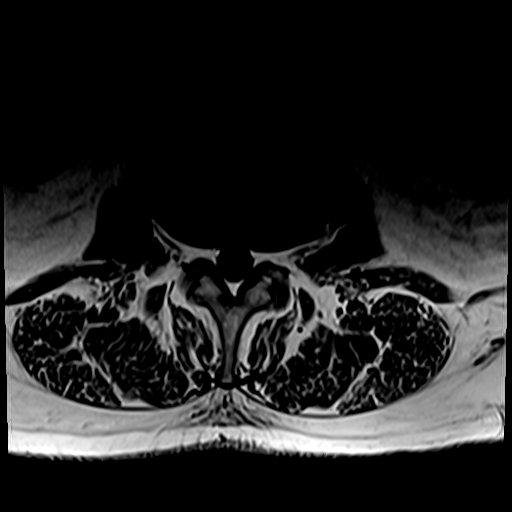
[im 29/41]
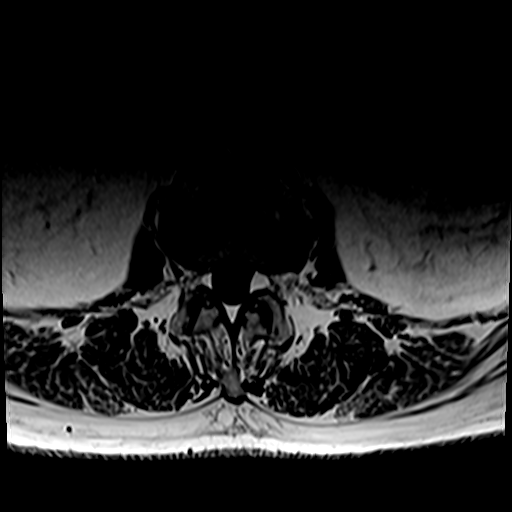
[im 35/41]
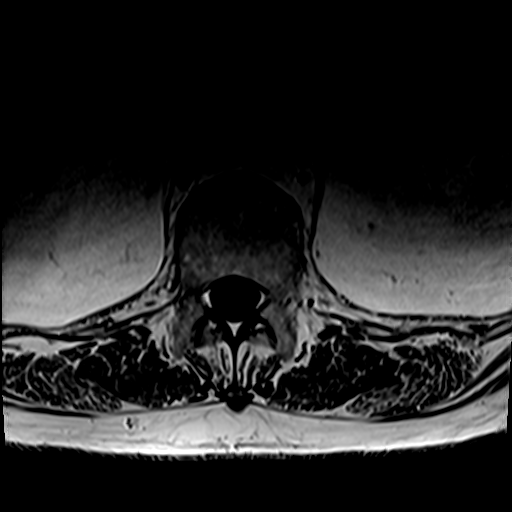
[im 41/41]
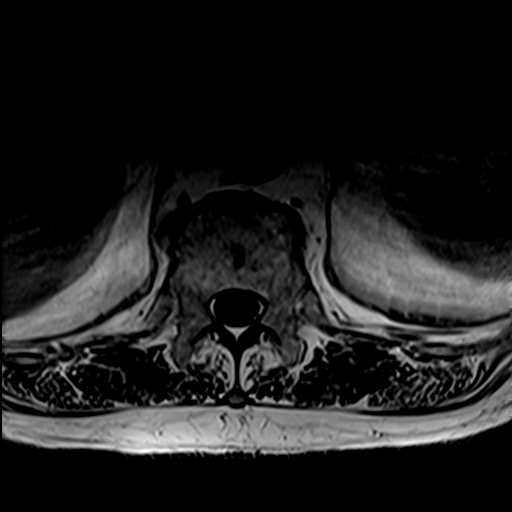

[31 of 48 positions shown; findings below may reference images not displayed]

FINDINGS: Segmentation: 5 non rib-bearing lumbar type vertebral bodies are
present. The lowest fully formed vertebral body is L5.

Alignment: Slight anterolisthesis is stable at L4-5. No other
significant listhesis is present. Lumbar lordosis is preserved.

Vertebrae: Hemangioma is again noted at L5. Progressive fatty
endplate marrow changes are present at L5-S1, left greater than
right. Schmorl's nodes are again noted at L3-4. Vertebral body
heights are normal.

Conus medullaris and cauda equina: Conus extends to the L2 level.
Conus and cauda equina appear normal.

Paraspinal and other soft tissues: Cystic changes are again noted in
the kidneys bilaterally. No solid lesions are present. No
significant adenopathy is present.

Disc levels:

L1-2: Mild facet hypertrophy is stable without significant stenosis.

L2-3: Mild disc bulging and facet hypertrophy is stable. No
significant stenosis is present.

L3-4: A broad-based disc protrusion present. Mild facet hypertrophy
and ligamentum flavum thickening is stable. Mild central canal
stenosis and moderate bilateral foraminal narrowing is stable.

L4-5: Left laminectomy is again noted. Broad-based disc protrusion
remains. Right greater than left subarticular narrowing present.
Moderate bilateral facet hypertrophy is stable. Moderate foraminal
narrowing is stable, left greater than right.

L5-S1: Left laminectomy is present. Broad-based disc protrusion
remains. Moderate foraminal narrowing is stable, left greater than
right.
IMPRESSION: 1. Stable multilevel spondylosis of the lumbar spine.
2. Left laminectomy at L4-5 and L5-S1.
3. Mild central canal stenosis and moderate bilateral foraminal
narrowing at L3-4 is stable.
4. Right greater than left subarticular and moderate foraminal
stenosis at L4-5 is stable.
5. Moderate foraminal narrowing bilaterally at L5-S1 is stable, left
greater than right.

## 2022-08-27 ENCOUNTER — Other Ambulatory Visit: Payer: Self-pay | Admitting: Student

## 2022-08-27 DIAGNOSIS — G459 Transient cerebral ischemic attack, unspecified: Secondary | ICD-10-CM

## 2022-09-09 ENCOUNTER — Ambulatory Visit
Admission: RE | Admit: 2022-09-09 | Discharge: 2022-09-09 | Disposition: A | Discharge status: Home / Self Care | Payer: Medicare Other | Source: Ambulatory Visit | Attending: Student | Admitting: Student

## 2022-09-09 DIAGNOSIS — G459 Transient cerebral ischemic attack, unspecified: Secondary | ICD-10-CM | POA: Diagnosis present

## 2022-11-22 ENCOUNTER — Emergency Department: Payer: Medicare Other

## 2022-11-22 ENCOUNTER — Emergency Department
Admission: EM | Admit: 2022-11-22 | Discharge: 2022-11-22 | Disposition: A | Discharge status: Skilled Nursing Facility | Payer: Medicare Other | Attending: Student in an Organized Health Care Education/Training Program | Admitting: Student in an Organized Health Care Education/Training Program

## 2022-11-22 ENCOUNTER — Other Ambulatory Visit: Payer: Self-pay

## 2022-11-22 DIAGNOSIS — R140 Abdominal distension (gaseous): Secondary | ICD-10-CM | POA: Diagnosis not present

## 2022-11-22 DIAGNOSIS — R1032 Left lower quadrant pain: Secondary | ICD-10-CM | POA: Diagnosis present

## 2022-11-22 LAB — COMPREHENSIVE METABOLIC PANEL
ALT: 38 U/L (ref 0–44)
AST: 31 U/L (ref 15–41)
Albumin: 3.8 g/dL (ref 3.5–5.0)
Alkaline Phosphatase: 107 U/L (ref 38–126)
Anion gap: 9 (ref 5–15)
BUN: 23 mg/dL (ref 8–23)
CO2: 26 mmol/L (ref 22–32)
Calcium: 9.1 mg/dL (ref 8.9–10.3)
Chloride: 104 mmol/L (ref 98–111)
Creatinine, Ser: 1.37 mg/dL — ABNORMAL HIGH (ref 0.61–1.24)
GFR, Estimated: 53 mL/min — ABNORMAL LOW (ref >60)
Glucose, Bld: 168 mg/dL — ABNORMAL HIGH (ref 70–99)
Potassium: 3.8 mmol/L (ref 3.5–5.1)
Sodium: 139 mmol/L (ref 135–145)
Total Bilirubin: 1.1 mg/dL (ref 0.3–1.2)
Total Protein: 7.6 g/dL (ref 6.5–8.1)

## 2022-11-22 LAB — CBC
HCT: 44.1 % (ref 39.0–52.0)
Hemoglobin: 13.8 g/dL (ref 13.0–17.0)
MCH: 27.7 pg (ref 26.0–34.0)
MCHC: 31.3 g/dL (ref 30.0–36.0)
MCV: 88.4 fL (ref 80.0–100.0)
Platelets: 228 10*3/uL (ref 150–400)
RBC: 4.99 MIL/uL (ref 4.22–5.81)
RDW: 15.2 % (ref 11.5–15.5)
WBC: 10 10*3/uL (ref 4.0–10.5)
nRBC: 0 % (ref 0.0–0.2)

## 2022-11-22 LAB — LIPASE, BLOOD: Lipase: 54 U/L — ABNORMAL HIGH (ref 11–51)

## 2022-11-22 MED ORDER — POLYETHYLENE GLYCOL 3350 17 G PO PACK: 17.0000 g | PACK | Freq: Every day | ORAL | 0 refills | Status: DC

## 2022-11-22 MED ORDER — IOHEXOL 300 MG/ML  SOLN
100.0000 mL | Freq: Once | INTRAMUSCULAR | Status: AC | PRN
  Administered 2022-11-22: 100 mL via INTRAVENOUS

## 2022-11-22 NOTE — ED Triage Notes (Signed)
First Nurse Note:  Pt via EMS from [facility]. Pt c/o abd pain for the past 3-4 months. States that he had an XR done at the facility and thinks he has an obstruction. Denies NVD. Pt is A&Ox4 and NAD  148/81 97% on RA 64 HR  195 CBG

## 2022-11-22 NOTE — ED Provider Notes (Signed)
[facility] Provider Note    Event Date/Time   First MD Initiated Contact with Patient 11/22/22 1335     (approximate)   History   Abdominal Pain   HPI  [name] [name] is a 77 y.o. male is a ER from [facility] for several months of intermittent abdominal pain particular in the left lower quadrant.  Had x-ray which showed "blockage.  "So he was sent to the ER.  He denies any chest pain or shortness of breath or fevers no dysuria.  States he has felt somewhat constipated but is still moving his bowels.     Physical Exam   Triage Vital Signs: ED Triage Vitals  Enc Vitals Group     BP 11/22/22 1240 113/63     Pulse Rate 11/22/22 1240 63     Resp 11/22/22 1240 18     Temp 11/22/22 1240 98.4 F (36.9 C)     Temp Source 11/22/22 1240 Oral     SpO2 11/22/22 1240 95 %     Weight --      Height --      Head Circumference --      Peak Flow --      Pain Score 11/22/22 1241 7     Pain Loc --      Pain Edu? --      Excl. in [place]? --     Most recent vital signs: Vitals:   11/22/22 1240 11/22/22 1630  BP: 113/63 139/68  Pulse: 63 (!) 56  Resp: 18 16  Temp: 98.4 F (36.9 C)   SpO2: 95% 96%     Constitutional: Alert  Eyes: Conjunctivae are normal.  Head: Atraumatic. Nose: No congestion/rhinnorhea. Mouth/Throat: Mucous membranes are moist.   Neck: Painless ROM.  Cardiovascular:   Good peripheral circulation. Respiratory: Normal respiratory effort.  No retractions.  Gastrointestinal: Soft distended but non tender  Musculoskeletal:  no deformity Neurologic:  MAE spontaneously. No gross focal neurologic deficits are appreciated.  Skin:  Skin is warm, dry and intact. No rash noted. Psychiatric: Mood and affect are normal. Speech and behavior are normal.    ED Results / Procedures / Treatments   Labs (all labs ordered are listed, but only abnormal results are displayed) Labs Reviewed  LIPASE, BLOOD - Abnormal; Notable for the following  components:      Result Value   Lipase 54 (*)    All other components within normal limits  COMPREHENSIVE METABOLIC PANEL - Abnormal; Notable for the following components:   Glucose, Bld 168 (*)    Creatinine, Ser 1.37 (*)    GFR, Estimated 53 (*)    All other components within normal limits  CBC  URINALYSIS, ROUTINE W REFLEX MICROSCOPIC     EKG     RADIOLOGY Please see ED Course for my review and interpretation.  I personally reviewed all radiographic images ordered to evaluate for the above acute complaints and reviewed radiology reports and findings.  These findings were personally discussed with the patient.  Please see medical record for radiology report.    PROCEDURES:  Critical Care performed: No  Procedures   MEDICATIONS ORDERED IN ED: Medications  iohexol (OMNIPAQUE) 300 MG/ML solution 100 mL (100 mLs Intravenous Contrast Given 11/22/22 1445)     IMPRESSION / MDM / ASSESSMENT AND PLAN / ED COURSE  I reviewed the triage vital signs and the nursing notes.  Differential diagnosis includes, but is not limited to, obstruction, perforation, colitis, SBO, mass  Patient presenting to the ER for evaluation of symptoms as described above.  Based on symptoms, risk factors and considered above differential, this presenting complaint could reflect a potentially life-threatening illness therefore the patient will be placed on continuous pulse oximetry and telemetry for monitoring.  Laboratory evaluation will be sent to evaluate for the above complaints.      Clinical Course as of 11/22/22 1638  Sat Nov 22, 2022  1526 CTA my review and interpretation does not show evidence of obstruction. [PR]  9675 ET imaging is reassuring per radiology.  No acute findings.  Mass mild constipation he is not currently on any laxatives.  Does not seem consistent with acute colitis given lack of fever, diarrhea, Lawry count..  Does appear appropriate for  outpatient follow-up. [PR]    Clinical Course User Index [PR] Merlyn Lot, MD    FINAL CLINICAL IMPRESSION(S) / ED DIAGNOSES   Final diagnoses:  Abdominal distension     Rx / DC Orders   ED Discharge Orders          Ordered    polyethylene glycol (MIRALAX / GLYCOLAX) 17 g packet  Daily        11/22/22 1632             Note:  This document was prepared using Dragon voice recognition software and may include unintentional dictation errors.    Merlyn Lot, MD 11/22/22 [phone]

## 2022-11-22 NOTE — ED Notes (Signed)
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 States had recent xray that confirmed. States Bms are painful with "excruciating pain" that is "sharp and stabbing" to rectum.

## 2022-11-22 NOTE — ED Triage Notes (Signed)
Pt to ED via [facility] from [facility]. Pt reports RLQ pain for 3-4 months. Pt reports hasn't had a BM in 5 days. Xray at facility showed blockage.

## 2022-12-02 ENCOUNTER — Encounter: Payer: Self-pay | Admitting: Neurosurgery

## 2022-12-02 ENCOUNTER — Ambulatory Visit (INDEPENDENT_AMBULATORY_CARE_PROVIDER_SITE_OTHER): Payer: Medicare Other | Admitting: Neurosurgery

## 2022-12-02 VITALS — BP 128/78 | Ht 74.02 in | Wt 289.0 lb

## 2022-12-02 DIAGNOSIS — G959 Disease of spinal cord, unspecified: Secondary | ICD-10-CM

## 2022-12-02 DIAGNOSIS — M48062 Spinal stenosis, lumbar region with neurogenic claudication: Secondary | ICD-10-CM | POA: Diagnosis not present

## 2022-12-02 DIAGNOSIS — G629 Polyneuropathy, unspecified: Secondary | ICD-10-CM

## 2022-12-02 NOTE — Progress Notes (Signed)
Referring Physician:  Idelle Crouch, MD [facility] [place],  [place] [address]  Primary Physician:  Hal Morales, DO  History of Present Illness: 12/02/2022 [name] is here today with a chief complaint of pain in his legs.  He has hypersensitivity in his legs.  He also gets discomfort in his legs when he stands and walks.  He can only walk about 90 feet with therapy currently.  He has had some trouble with his memory recently.  He is also having some trouble with his coordination.   I have utilized the care everywhere function in epic to review the outside records available from external health systems.  04/09/2022 [name] is here today with a chief complaint of neck pain, numbness and tingling in the bilateral arms/hands, problems with gait along with weakness in both.  He has been having problems for 2 years. They have been slowly getting worse. He underwent anterior cervical discectomy and fusion on October 26, 2020 and was taken back for posterior cervical decompression on October 27, 2020. Since that time, he has had severe difficulty with walking. He can stand, but his legs give out. He was walking at 1 point in [place] [facility], but is now having trouble. He has numbness and tingling in his hands. He has pain in his legs that feels like hypersensitivity.  Bowel/Bladder Dysfunction: none  Conservative measures:  Physical therapy: has not participated in Multimodal medical therapy including regular antiinflammatories: hydrocodone, lyrica Injections: has not received epidural steroid injections for his neck  Past Surgery: ACDF in 2021 by Dr. Cyndy Freeze in [name] has symptoms of cervical myelopathy.  The symptoms are causing a significant impact on the patient's life.   Review of Systems:  A 10 point review of systems is negative, except for the pertinent positives and negatives detailed in the  HPI.  Past Medical History: Past Medical History:  Diagnosis Date   Anxiety disorder 05/05/2014   unspecifed   Arthritis    CHF (congestive heart failure) (HCC)    Colonic polyp    COPD (chronic obstructive pulmonary disease) ([place])    Depression    Diabetes mellitus without complication (HCC)    Dysphagia    Dyspnea    Elevated prostate specific antigen (PSA)    Essential (primary) hypertension 05/05/2014   GERD (gastroesophageal reflux disease)    GI bleed    due to diverticulitis    Heart murmur    hx of 1960s   Hemorrhoids    History of thyroid cancer 05/05/2014   Hypercholesteremia    Hyperlipidemia    Hypertension    Hypothyroidism 05/05/2014   Male erectile dysfunction 12/29/2012   Malignant neoplasm of prostate ([place])    10/30/2011 T1c - Identified by needle biopsy . Gleason 7 (3+4) 5 of 12 cores, Bilateral    Pneumonia    Prediabetes 06/22/2017   Sleep apnea    CPAP , report on chart , not used in last 2 weeks    Urinary obstruction    not elsewhere classified    Past Surgical History: Past Surgical History:  Procedure Laterality Date   BACK SURGERY     lumbar 1987    BACK SURGERY     C-6- Plate&Pin   CATARACT EXTRACTION W/PHACO Right 09/15/2018   Procedure: CATARACT EXTRACTION PHACO AND INTRAOCULAR LENS PLACEMENT ([place]) RIGHT;  Surgeon: Leandrew Koyanagi, MD;  Location: [place];  Service: Ophthalmology;  Laterality: Right;  Requests to be last case   CATARACT EXTRACTION W/PHACO Left 10/06/2018   Procedure: CATARACT EXTRACTION PHACO AND INTRAOCULAR LENS PLACEMENT ([place])  LEFT;  Surgeon: Leandrew Koyanagi, MD;  Location: [place];  Service: Ophthalmology;  Laterality: Left;   CERVICAL DISC SURGERY     COLONOSCOPY     10/06/2011, 06/05/2006, 05/06/2002 adematous polyps: CBF 09/2014; Recall Ltr mailed 02/27/2015 (dw)   COLONOSCOPY WITH PROPOFOL N/A 07/06/2015   Procedure: COLONOSCOPY WITH PROPOFOL;  Surgeon: Manya Silvas, MD;   Location: [facility] ENDOSCOPY;  Service: Endoscopy;  Laterality: N/A;   COLONOSCOPY WITH PROPOFOL N/A 09/01/2018   Procedure: COLONOSCOPY WITH PROPOFOL;  Surgeon: Manya Silvas, MD;  Location: [facility] ENDOSCOPY;  Service: Endoscopy;  Laterality: N/A;   COLONOSCOPY WITH PROPOFOL N/A 04/17/2020   Procedure: COLONOSCOPY WITH PROPOFOL;  Surgeon: Lucilla Lame, MD;  Location: [facility] ENDOSCOPY;  Service: Endoscopy;  Laterality: N/A;   ESOPHAGOGASTRODUODENOSCOPY (EGD) WITH PROPOFOL N/A 04/17/2020   Procedure: ESOPHAGOGASTRODUODENOSCOPY (EGD) WITH PROPOFOL;  Surgeon: Lucilla Lame, MD;  Location: [facility] ENDOSCOPY;  Service: Endoscopy;  Laterality: N/A;   EYE SURGERY     HERNIA REPAIR     umbilical hernia repair    LAMINECTOMY FOR EXCISION / EVACUATION INTRASPINAL LESION     lumbar   OTHER SURGICAL HISTORY     right rotator cuff surgery    OTHER SURGICAL HISTORY     bilateral tubes in ears    POSTERIOR CERVICAL LAMINECTOMY N/A 10/27/2020   Procedure: POSTERIOR CERVICAL LAMINECTOMY CERVICAL THREE-CERVICAL SIX;  Surgeon: Judith Part, MD;  Location: [place];  Service: Neurosurgery;  Laterality: N/A;   POSTERIOR LAMINECTOMY / DECOMPRESSION CERVICAL SPINE     PROSTATE BIOPSY     10/22/2011 Volume:55.8 cc's, PSA:4.5, Free PSA:12%   ROBOT ASSISTED LAPAROSCOPIC RADICAL PROSTATECTOMY  12/15/2011   Procedure: ROBOTIC ASSISTED LAPAROSCOPIC RADICAL PROSTATECTOMY LEVEL 2;  Surgeon: Dutch Gray, MD;  Location: [facility];  Service: Urology;  Laterality: N/A;       ROTATOR CUFF REPAIR Right    SHOULDER ARTHROSCOPY WITH SUBACROMIAL DECOMPRESSION, ROTATOR CUFF REPAIR AND BICEP TENDON REPAIR Left 11/07/2021   Procedure: SHOULDER ARTHROSCOPY WITH DEBRIDEMENT, DECOMPRESSION, ROTATOR CUFF REPAIR AND BICEPS TENODESIS.;  Surgeon: Corky Mull, MD;  Location: [facility];  Service: Orthopedics;  Laterality: Left;   TONSILLECTOMY     TOTAL THYROIDECTOMY      Allergies: Allergies as of 12/02/2022   (No Active Allergies)     Medications: No outpatient medications have been marked as taking for the 12/02/22 encounter (Appointment) with Meade Maw, MD.    Social History: Social History   Tobacco Use   Smoking status: Former    Packs/day: 0.50    Years: 35.00    Total pack years: 17.50    Types: Cigarettes    Quit date: 2020    Years since quitting: 4.0   Smokeless tobacco: Never  Vaping Use   Vaping Use: Never used  Substance Use Topics   Alcohol use: Not Currently    Alcohol/week: 8.0 standard drinks of alcohol    Types: 7 Glasses of wine, 1 Shots of liquor per week    Comment: not in 2 years   Drug use: No    Family Medical History: Family History  Problem Relation Age of Onset   Hypertension Mother    Anxiety disorder Mother    Stroke Mother        "mini strokes"   Coronary artery disease Mother    Heart attack  Mother    Emphysema Father    Heart disease Father    Scoliosis Father    Coronary artery disease Father    GU problems Neg Hx    Kidney disease Neg Hx    Prostate cancer Neg Hx     Physical Examination: There were no vitals filed for this visit.  General: Patient is well developed, well nourished, calm, collected, and in no apparent distress. Attention to examination is appropriate.  Neck:   Supple.  Full range of motion.  Respiratory: Patient is breathing without any difficulty.   NEUROLOGICAL:     Awake, alert, oriented to person, place, and time.  Speech is clear and fluent.   Cranial Nerves: Pupils equal round and reactive to light.  Facial tone is symmetric.  Facial sensation is symmetric. Shoulder shrug is symmetric. Tongue protrusion is midline.  There is no pronator drift.  ROM of spine: full.    Strength: Side Biceps Triceps Deltoid Interossei Grip Wrist Ext. Wrist Flex.  R '5 5 5 5 5 5 5  '$ L '5 5 5 5 '$ 4+ 4+ 4+   Side Iliopsoas Quads Hamstring PF DF EHL  R '5 5 5 5 5 5  '$ L '5 5 5 5 5 5   '$ Reflexes are 1+ and symmetric at the biceps, triceps,  brachioradialis, patella and achilles.   Hoffman's is absent.   Bilateral upper and lower extremity sensation is symmetric to light touch.   Below his knees, he has hypersensitivity to touch symmetrically. No evidence of dysmetria noted.  Gait is untested-he is in a wheelchair.     Medical Decision Making  Imaging: MRI C spine 04/11/21 IMPRESSION:  Interval posterior decompression C2 through C4. ACDF C3 through C6.  Mild anterolisthesis C3-4 has developed since the prior study.   Interval improvement in cord signal abnormalities at C3-4 and C5-6.  There remains a small area of hemorrhage in the cord on the left at  C5-6.   There is mild spinal stenosis and mild foraminal narrowing  bilaterally at C5-6.   Mild to moderate spinal stenosis C6-7 with moderate foraminal  stenosis bilaterally, unchanged.   Electronically Signed    By: Franchot Gallo M.D.    On: 04/12/2021 08:00   MRI C spine 05/05/2022 IMPRESSION: 1. Stable postsurgical changes reflecting C3 through C6 ACDF and posterior decompression at C2 through C4 without significant spinal canal stenosis at the surgical levels. Up to mild spinal canal and bilateral neural foraminal stenosis at C5-C6 is not significantly changed. 2. 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 3. Small focus of myelomalacia at C3-C4 and small focus of chronic blood products in the cord at C5-C6, not significantly changed. 4. Overall mild degenerative changes throughout the thoracic spine without significant spinal canal or neural foraminal stenosis. No evidence of cord or nerve root compression. No acute finding in the thoracic spine. 5. Flowing anterior osteophytes throughout the thoracic spine consistent with diffuse idiopathic skeletal hyperostosis.     Electronically Signed   By: Valetta Mole M.D.   On: 05/06/2022  10:56   I have personally reviewed the images and agree with the above interpretation.  Assessment and Plan: Mr. Winch is a pleasant 77 y.o. 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  He has some symptoms of neuropathy and some symptoms that are possible neurogenic claudication.  I have recommended that he restart physical therapy.  I will obtain an MRI scan of his lumbar spine.  Depending on these results, I will likely recommend that he consider injections.  I spent a total of 15 minutes in this patient's care today. This time was spent reviewing pertinent records including imaging studies, obtaining and confirming history, performing a directed evaluation, formulating and discussing my recommendations, and documenting the visit within the medical record.      Thank you for involving me in the care of this patient.      Neziah Braley K. Izora Ribas MD, [facility] Neurosurgery

## 2022-12-03 NOTE — Addendum Note (Signed)
Addended by: Herb Grays on: 12/03/2022 09:48 AM   Modules accepted: Orders

## 2023-02-04 ENCOUNTER — Inpatient Hospital Stay
Admission: EM | Admit: 2023-02-04 | Discharge: 2023-02-10 | DRG: 092 | Disposition: A | Discharge status: Skilled Nursing Facility | Payer: Medicare Other | Source: Skilled Nursing Facility | Attending: Internal Medicine | Admitting: Internal Medicine

## 2023-02-04 ENCOUNTER — Emergency Department: Payer: Medicare Other

## 2023-02-04 ENCOUNTER — Observation Stay: Payer: Medicare Other

## 2023-02-04 DIAGNOSIS — Z7951 Long term (current) use of inhaled steroids: Secondary | ICD-10-CM

## 2023-02-04 DIAGNOSIS — Z8249 Family history of ischemic heart disease and other diseases of the circulatory system: Secondary | ICD-10-CM

## 2023-02-04 DIAGNOSIS — R29810 Facial weakness: Secondary | ICD-10-CM | POA: Diagnosis present

## 2023-02-04 DIAGNOSIS — R4781 Slurred speech: Secondary | ICD-10-CM | POA: Diagnosis not present

## 2023-02-04 DIAGNOSIS — Z79899 Other long term (current) drug therapy: Secondary | ICD-10-CM

## 2023-02-04 DIAGNOSIS — I5032 Chronic diastolic (congestive) heart failure: Secondary | ICD-10-CM | POA: Diagnosis present

## 2023-02-04 DIAGNOSIS — R471 Dysarthria and anarthria: Secondary | ICD-10-CM | POA: Diagnosis not present

## 2023-02-04 DIAGNOSIS — G4733 Obstructive sleep apnea (adult) (pediatric): Secondary | ICD-10-CM | POA: Diagnosis present

## 2023-02-04 DIAGNOSIS — Z823 Family history of stroke: Secondary | ICD-10-CM

## 2023-02-04 DIAGNOSIS — Z86010 Personal history of colonic polyps: Secondary | ICD-10-CM

## 2023-02-04 DIAGNOSIS — T402X5A Adverse effect of other opioids, initial encounter: Secondary | ICD-10-CM | POA: Diagnosis present

## 2023-02-04 DIAGNOSIS — R5383 Other fatigue: Secondary | ICD-10-CM

## 2023-02-04 DIAGNOSIS — K2289 Other specified disease of esophagus: Secondary | ICD-10-CM

## 2023-02-04 DIAGNOSIS — E7800 Pure hypercholesterolemia, unspecified: Secondary | ICD-10-CM | POA: Diagnosis present

## 2023-02-04 DIAGNOSIS — K222 Esophageal obstruction: Secondary | ICD-10-CM | POA: Diagnosis present

## 2023-02-04 DIAGNOSIS — I639 Cerebral infarction, unspecified: Secondary | ICD-10-CM

## 2023-02-04 DIAGNOSIS — R0600 Dyspnea, unspecified: Secondary | ICD-10-CM

## 2023-02-04 DIAGNOSIS — Z87891 Personal history of nicotine dependence: Secondary | ICD-10-CM

## 2023-02-04 DIAGNOSIS — Z794 Long term (current) use of insulin: Secondary | ICD-10-CM

## 2023-02-04 DIAGNOSIS — Z1152 Encounter for screening for COVID-19: Secondary | ICD-10-CM

## 2023-02-04 DIAGNOSIS — K219 Gastro-esophageal reflux disease without esophagitis: Secondary | ICD-10-CM | POA: Diagnosis present

## 2023-02-04 DIAGNOSIS — L03113 Cellulitis of right upper limb: Secondary | ICD-10-CM | POA: Diagnosis present

## 2023-02-04 DIAGNOSIS — E1165 Type 2 diabetes mellitus with hyperglycemia: Secondary | ICD-10-CM | POA: Diagnosis not present

## 2023-02-04 DIAGNOSIS — G928 Other toxic encephalopathy: Secondary | ICD-10-CM | POA: Diagnosis not present

## 2023-02-04 DIAGNOSIS — M109 Gout, unspecified: Secondary | ICD-10-CM | POA: Diagnosis present

## 2023-02-04 DIAGNOSIS — M778 Other enthesopathies, not elsewhere classified: Secondary | ICD-10-CM | POA: Diagnosis present

## 2023-02-04 DIAGNOSIS — M19029 Primary osteoarthritis, unspecified elbow: Secondary | ICD-10-CM | POA: Diagnosis present

## 2023-02-04 DIAGNOSIS — R001 Bradycardia, unspecified: Secondary | ICD-10-CM

## 2023-02-04 DIAGNOSIS — Z6836 Body mass index (BMI) 36.0-36.9, adult: Secondary | ICD-10-CM

## 2023-02-04 DIAGNOSIS — K7689 Other specified diseases of liver: Secondary | ICD-10-CM | POA: Diagnosis present

## 2023-02-04 DIAGNOSIS — R0902 Hypoxemia: Secondary | ICD-10-CM | POA: Diagnosis present

## 2023-02-04 DIAGNOSIS — Z818 Family history of other mental and behavioral disorders: Secondary | ICD-10-CM

## 2023-02-04 DIAGNOSIS — Z8546 Personal history of malignant neoplasm of prostate: Secondary | ICD-10-CM

## 2023-02-04 DIAGNOSIS — Z993 Dependence on wheelchair: Secondary | ICD-10-CM

## 2023-02-04 DIAGNOSIS — G9340 Encephalopathy, unspecified: Secondary | ICD-10-CM | POA: Diagnosis present

## 2023-02-04 DIAGNOSIS — Z7984 Long term (current) use of oral hypoglycemic drugs: Secondary | ICD-10-CM

## 2023-02-04 DIAGNOSIS — T481X5A Adverse effect of skeletal muscle relaxants [neuromuscular blocking agents], initial encounter: Secondary | ICD-10-CM | POA: Diagnosis present

## 2023-02-04 DIAGNOSIS — I110 Hypertensive heart disease with heart failure: Secondary | ICD-10-CM | POA: Diagnosis present

## 2023-02-04 DIAGNOSIS — Z7902 Long term (current) use of antithrombotics/antiplatelets: Secondary | ICD-10-CM

## 2023-02-04 DIAGNOSIS — T380X5A Adverse effect of glucocorticoids and synthetic analogues, initial encounter: Secondary | ICD-10-CM | POA: Diagnosis not present

## 2023-02-04 DIAGNOSIS — E860 Dehydration: Secondary | ICD-10-CM | POA: Diagnosis present

## 2023-02-04 DIAGNOSIS — T450X5A Adverse effect of antiallergic and antiemetic drugs, initial encounter: Secondary | ICD-10-CM | POA: Diagnosis present

## 2023-02-04 DIAGNOSIS — R230 Cyanosis: Secondary | ICD-10-CM | POA: Insufficient documentation

## 2023-02-04 DIAGNOSIS — J449 Chronic obstructive pulmonary disease, unspecified: Secondary | ICD-10-CM | POA: Diagnosis present

## 2023-02-04 DIAGNOSIS — F0394 Unspecified dementia, unspecified severity, with anxiety: Secondary | ICD-10-CM | POA: Diagnosis present

## 2023-02-04 DIAGNOSIS — Z825 Family history of asthma and other chronic lower respiratory diseases: Secondary | ICD-10-CM

## 2023-02-04 DIAGNOSIS — R479 Unspecified speech disturbances: Secondary | ICD-10-CM | POA: Insufficient documentation

## 2023-02-04 DIAGNOSIS — Z79890 Hormone replacement therapy (postmenopausal): Secondary | ICD-10-CM

## 2023-02-04 DIAGNOSIS — E890 Postprocedural hypothyroidism: Secondary | ICD-10-CM | POA: Diagnosis present

## 2023-02-04 DIAGNOSIS — Z85850 Personal history of malignant neoplasm of thyroid: Secondary | ICD-10-CM

## 2023-02-04 DIAGNOSIS — E1141 Type 2 diabetes mellitus with diabetic mononeuropathy: Secondary | ICD-10-CM | POA: Diagnosis present

## 2023-02-04 DIAGNOSIS — Z9989 Dependence on other enabling machines and devices: Secondary | ICD-10-CM

## 2023-02-04 LAB — PROTIME-INR
INR: 1.1 (ref 0.8–1.2)
Prothrombin Time: 13.8 seconds (ref 11.4–15.2)

## 2023-02-04 LAB — DIFFERENTIAL
Abs Immature Granulocytes: 0.03 10*3/uL (ref 0.00–0.07)
Basophils Absolute: 0 10*3/uL (ref 0.0–0.1)
Basophils Relative: 0 %
Eosinophils Absolute: 0.1 10*3/uL (ref 0.0–0.5)
Eosinophils Relative: 2 %
Immature Granulocytes: 0 %
Lymphocytes Relative: 14 %
Lymphs Abs: 1 10*3/uL (ref 0.7–4.0)
Monocytes Absolute: 0.6 10*3/uL (ref 0.1–1.0)
Monocytes Relative: 8 %
Neutro Abs: 5.2 10*3/uL (ref 1.7–7.7)
Neutrophils Relative %: 76 %

## 2023-02-04 LAB — RESP PANEL BY RT-PCR (RSV, FLU A&B, COVID)  RVPGX2
Influenza A by PCR: NEGATIVE
Influenza B by PCR: NEGATIVE
Resp Syncytial Virus by PCR: NEGATIVE
SARS Coronavirus 2 by RT PCR: NEGATIVE

## 2023-02-04 LAB — COMPREHENSIVE METABOLIC PANEL
ALT: 25 U/L (ref 0–44)
AST: 20 U/L (ref 15–41)
Albumin: 3.5 g/dL (ref 3.5–5.0)
Alkaline Phosphatase: 104 U/L (ref 38–126)
Anion gap: 10 (ref 5–15)
BUN: 19 mg/dL (ref 8–23)
CO2: 29 mmol/L (ref 22–32)
Calcium: 9.2 mg/dL (ref 8.9–10.3)
Chloride: 102 mmol/L (ref 98–111)
Creatinine, Ser: 1.19 mg/dL (ref 0.61–1.24)
GFR, Estimated: >60 mL/min (ref >60)
Glucose, Bld: 174 mg/dL — ABNORMAL HIGH (ref 70–99)
Potassium: 3.9 mmol/L (ref 3.5–5.1)
Sodium: 141 mmol/L (ref 135–145)
Total Bilirubin: 1.3 mg/dL — ABNORMAL HIGH (ref 0.3–1.2)
Total Protein: 7.1 g/dL (ref 6.5–8.1)

## 2023-02-04 LAB — CBC
HCT: 43.2 % (ref 39.0–52.0)
Hemoglobin: 13.8 g/dL (ref 13.0–17.0)
MCH: 27.7 pg (ref 26.0–34.0)
MCHC: 31.9 g/dL (ref 30.0–36.0)
MCV: 86.7 fL (ref 80.0–100.0)
Platelets: 155 10*3/uL (ref 150–400)
RBC: 4.98 MIL/uL (ref 4.22–5.81)
RDW: 14.7 % (ref 11.5–15.5)
WBC: 7 10*3/uL (ref 4.0–10.5)
nRBC: 0 % (ref 0.0–0.2)

## 2023-02-04 LAB — TROPONIN I (HIGH SENSITIVITY)
Troponin I (High Sensitivity): 7 ng/L (ref <18)
Troponin I (High Sensitivity): 7 ng/L (ref <18)

## 2023-02-04 LAB — APTT: aPTT: 28 seconds (ref 24–36)

## 2023-02-04 LAB — CBG MONITORING, ED: Glucose-Capillary: 165 mg/dL — ABNORMAL HIGH (ref 70–99)

## 2023-02-04 LAB — MAGNESIUM: Magnesium: 1.8 mg/dL (ref 1.7–2.4)

## 2023-02-04 LAB — ETHANOL: Alcohol, Ethyl (B): <10 mg/dL (ref <10)

## 2023-02-04 LAB — D-DIMER, QUANTITATIVE: D-Dimer, Quant: 0.99 ug/mL-FEU — ABNORMAL HIGH (ref 0.00–0.50)

## 2023-02-04 MED ORDER — GENTEAL TEARS 0.1-0.2-0.3 % OP SOLN: 1.0000 [drp] | Freq: Four times a day (QID) | OPHTHALMIC | Status: DC

## 2023-02-04 MED ORDER — SODIUM CHLORIDE 0.9 % IV BOLUS
1000.0000 mL | Freq: Once | INTRAVENOUS | Status: AC
  Administered 2023-02-04: 1000 mL via INTRAVENOUS

## 2023-02-04 MED ORDER — RISPERIDONE 1 MG PO TABS
1.0000 mg | ORAL_TABLET | Freq: Two times a day (BID) | ORAL | Status: DC
  Administered 2023-02-04 – 2023-02-05 (×2): 1 mg via ORAL
  Filled 2023-02-04 (×2): qty 1

## 2023-02-04 MED ORDER — DONEPEZIL HCL 5 MG PO TABS
10.0000 mg | ORAL_TABLET | Freq: Every day | ORAL | Status: DC
  Administered 2023-02-04 – 2023-02-09 (×6): 10 mg via ORAL
  Filled 2023-02-04 (×6): qty 2

## 2023-02-04 MED ORDER — ALBUTEROL SULFATE (2.5 MG/3ML) 0.083% IN NEBU: 3.0000 mL | INHALATION_SOLUTION | Freq: Four times a day (QID) | RESPIRATORY_TRACT | Status: DC | PRN

## 2023-02-04 MED ORDER — ACETAMINOPHEN 650 MG RE SUPP: 650.0000 mg | Freq: Four times a day (QID) | RECTAL | Status: DC | PRN

## 2023-02-04 MED ORDER — POLYVINYL ALCOHOL 1.4 % OP SOLN
1.0000 [drp] | Freq: Four times a day (QID) | OPHTHALMIC | Status: DC
  Administered 2023-02-04 – 2023-02-10 (×18): 1 [drp] via OPHTHALMIC
  Filled 2023-02-04: qty 15

## 2023-02-04 MED ORDER — INSULIN ASPART 100 UNIT/ML IJ SOLN
0.0000 [IU] | Freq: Every day | INTRAMUSCULAR | Status: DC
  Administered 2023-02-04: 0 [IU] via SUBCUTANEOUS
  Administered 2023-02-06: 3 [IU] via SUBCUTANEOUS
  Filled 2023-02-04: qty 1

## 2023-02-04 MED ORDER — PANTOPRAZOLE SODIUM 40 MG PO TBEC
40.0000 mg | DELAYED_RELEASE_TABLET | Freq: Every day | ORAL | Status: DC
  Administered 2023-02-05 – 2023-02-10 (×6): 40 mg via ORAL
  Filled 2023-02-04 (×6): qty 1

## 2023-02-04 MED ORDER — TRAZODONE HCL 50 MG PO TABS
25.0000 mg | ORAL_TABLET | Freq: Every evening | ORAL | Status: DC | PRN
  Administered 2023-02-06 – 2023-02-09 (×4): 25 mg via ORAL
  Filled 2023-02-04 (×5): qty 1

## 2023-02-04 MED ORDER — HYDROCODONE-ACETAMINOPHEN 5-325 MG PO TABS: 1.0000 | ORAL_TABLET | Freq: Three times a day (TID) | ORAL | Status: DC | PRN

## 2023-02-04 MED ORDER — ENOXAPARIN SODIUM 60 MG/0.6ML IJ SOSY
0.5000 mg/kg | PREFILLED_SYRINGE | INTRAMUSCULAR | Status: DC
  Administered 2023-02-04 – 2023-02-09 (×6): 57.5 mg via SUBCUTANEOUS
  Filled 2023-02-04 (×6): qty 0.6

## 2023-02-04 MED ORDER — LINACLOTIDE 145 MCG PO CAPS
290.0000 ug | ORAL_CAPSULE | Freq: Every day | ORAL | Status: DC
  Administered 2023-02-04 – 2023-02-09 (×6): 290 ug via ORAL
  Filled 2023-02-04 (×6): qty 2
  Filled 2023-02-04: qty 1
  Filled 2023-02-04: qty 2

## 2023-02-04 MED ORDER — POTASSIUM CHLORIDE CRYS ER 20 MEQ PO TBCR
20.0000 meq | EXTENDED_RELEASE_TABLET | Freq: Three times a day (TID) | ORAL | Status: DC
  Administered 2023-02-05 (×2): 20 meq via ORAL
  Filled 2023-02-04 (×2): qty 1

## 2023-02-04 MED ORDER — MOMETASONE FURO-FORMOTEROL FUM 200-5 MCG/ACT IN AERO
2.0000 | INHALATION_SPRAY | Freq: Two times a day (BID) | RESPIRATORY_TRACT | Status: DC
  Administered 2023-02-04 – 2023-02-10 (×11): 2 via RESPIRATORY_TRACT
  Filled 2023-02-04: qty 8.8

## 2023-02-04 MED ORDER — ISOSORBIDE DINITRATE 10 MG PO TABS
10.0000 mg | ORAL_TABLET | Freq: Three times a day (TID) | ORAL | Status: DC
  Administered 2023-02-04 – 2023-02-10 (×16): 10 mg via ORAL
  Filled 2023-02-04 (×18): qty 1

## 2023-02-04 MED ORDER — SODIUM CHLORIDE 0.9% FLUSH: 3.0000 mL | Freq: Once | INTRAVENOUS | Status: DC

## 2023-02-04 MED ORDER — LEVOTHYROXINE SODIUM 150 MCG PO TABS
150.0000 ug | ORAL_TABLET | Freq: Every day | ORAL | Status: DC
  Administered 2023-02-05 – 2023-02-10 (×5): 150 ug via ORAL
  Filled 2023-02-04: qty 1
  Filled 2023-02-04: qty 3
  Filled 2023-02-04 (×2): qty 1
  Filled 2023-02-04 (×2): qty 3
  Filled 2023-02-04 (×2): qty 1
  Filled 2023-02-04: qty 3
  Filled 2023-02-04: qty 1

## 2023-02-04 MED ORDER — SODIUM CHLORIDE 0.9% FLUSH
3.0000 mL | Freq: Two times a day (BID) | INTRAVENOUS | Status: DC
  Administered 2023-02-05 – 2023-02-10 (×10): 3 mL via INTRAVENOUS

## 2023-02-04 MED ORDER — BENZOCAINE-MENTHOL 15-3.6 MG MT LOZG: 1.0000 | LOZENGE | OROMUCOSAL | Status: DC | PRN

## 2023-02-04 MED ORDER — FUROSEMIDE 40 MG PO TABS
40.0000 mg | ORAL_TABLET | Freq: Two times a day (BID) | ORAL | Status: DC
  Administered 2023-02-05 – 2023-02-10 (×10): 40 mg via ORAL
  Filled 2023-02-04 (×10): qty 1

## 2023-02-04 MED ORDER — CLOPIDOGREL BISULFATE 75 MG PO TABS
75.0000 mg | ORAL_TABLET | Freq: Every day | ORAL | Status: DC
  Administered 2023-02-04 – 2023-02-10 (×7): 75 mg via ORAL
  Filled 2023-02-04 (×7): qty 1

## 2023-02-04 MED ORDER — METFORMIN HCL ER 500 MG PO TB24: 1000.0000 mg | ORAL_TABLET | Freq: Every day | ORAL | Status: DC

## 2023-02-04 MED ORDER — MAGNESIUM OXIDE 400 MG PO TABS
400.0000 mg | ORAL_TABLET | Freq: Two times a day (BID) | ORAL | Status: DC
  Administered 2023-02-04 – 2023-02-10 (×12): 400 mg via ORAL
  Filled 2023-02-04 (×23): qty 1

## 2023-02-04 MED ORDER — DIPHENHYDRAMINE HCL 25 MG PO CAPS
25.0000 mg | ORAL_CAPSULE | Freq: Four times a day (QID) | ORAL | Status: DC | PRN
  Administered 2023-02-06: 25 mg via ORAL
  Filled 2023-02-04 (×3): qty 1

## 2023-02-04 MED ORDER — FLUOXETINE HCL 20 MG PO CAPS
40.0000 mg | ORAL_CAPSULE | Freq: Every day | ORAL | Status: DC
  Administered 2023-02-04 – 2023-02-10 (×7): 40 mg via ORAL
  Filled 2023-02-04 (×7): qty 2

## 2023-02-04 MED ORDER — BISACODYL 10 MG RE SUPP: 10.0000 mg | Freq: Every day | RECTAL | Status: DC | PRN

## 2023-02-04 MED ORDER — SIMETHICONE 80 MG PO CHEW
80.0000 mg | CHEWABLE_TABLET | Freq: Two times a day (BID) | ORAL | Status: DC
  Administered 2023-02-04 – 2023-02-10 (×8): 80 mg via ORAL
  Filled 2023-02-04 (×12): qty 1

## 2023-02-04 MED ORDER — OMEGA-3-ACID ETHYL ESTERS 1 G PO CAPS
1.0000 g | ORAL_CAPSULE | Freq: Every day | ORAL | Status: DC
  Administered 2023-02-05 – 2023-02-10 (×6): 1 g via ORAL
  Filled 2023-02-04 (×6): qty 1

## 2023-02-04 MED ORDER — ALBUTEROL SULFATE (2.5 MG/3ML) 0.083% IN NEBU
2.5000 mg | INHALATION_SOLUTION | RESPIRATORY_TRACT | Status: AC
  Administered 2023-02-04 – 2023-02-05 (×2): 2.5 mg via RESPIRATORY_TRACT
  Filled 2023-02-04: qty 3

## 2023-02-04 MED ORDER — POLYETHYLENE GLYCOL 3350 17 G PO PACK
17.0000 g | PACK | Freq: Every day | ORAL | Status: DC
  Administered 2023-02-07 – 2023-02-08 (×2): 17 g via ORAL
  Filled 2023-02-04 (×2): qty 1

## 2023-02-04 MED ORDER — PREGABALIN 75 MG PO CAPS
300.0000 mg | ORAL_CAPSULE | Freq: Two times a day (BID) | ORAL | Status: DC
  Administered 2023-02-04 – 2023-02-10 (×11): 300 mg via ORAL
  Filled 2023-02-04: qty 4
  Filled 2023-02-04: qty 6
  Filled 2023-02-04 (×4): qty 4
  Filled 2023-02-04: qty 6
  Filled 2023-02-04 (×5): qty 4

## 2023-02-04 MED ORDER — MENTHOL 3 MG MT LOZG: 1.0000 | LOZENGE | OROMUCOSAL | Status: DC | PRN

## 2023-02-04 MED ORDER — SENNOSIDES-DOCUSATE SODIUM 8.6-50 MG PO TABS
1.0000 | ORAL_TABLET | Freq: Two times a day (BID) | ORAL | Status: DC
  Administered 2023-02-04 – 2023-02-08 (×5): 1 via ORAL
  Filled 2023-02-04 (×7): qty 1

## 2023-02-04 MED ORDER — ACETAMINOPHEN 325 MG PO TABS
650.0000 mg | ORAL_TABLET | Freq: Four times a day (QID) | ORAL | Status: DC | PRN
  Filled 2023-02-04: qty 2

## 2023-02-04 MED ORDER — INSULIN ASPART 100 UNIT/ML IJ SOLN
0.0000 [IU] | Freq: Three times a day (TID) | INTRAMUSCULAR | Status: DC
  Administered 2023-02-05: 3 [IU] via SUBCUTANEOUS
  Administered 2023-02-05: 2 [IU] via SUBCUTANEOUS
  Administered 2023-02-06: 8 [IU] via SUBCUTANEOUS
  Administered 2023-02-06: 5 [IU] via SUBCUTANEOUS
  Administered 2023-02-06: 3 [IU] via SUBCUTANEOUS
  Administered 2023-02-07 (×2): 8 [IU] via SUBCUTANEOUS
  Administered 2023-02-07: 5 [IU] via SUBCUTANEOUS
  Filled 2023-02-04 (×8): qty 1

## 2023-02-04 MED ORDER — ATORVASTATIN CALCIUM 20 MG PO TABS
40.0000 mg | ORAL_TABLET | Freq: Every day | ORAL | Status: DC
  Administered 2023-02-04 – 2023-02-10 (×7): 40 mg via ORAL
  Filled 2023-02-04 (×7): qty 2

## 2023-02-04 MED ORDER — LINAGLIPTIN 5 MG PO TABS
5.0000 mg | ORAL_TABLET | Freq: Every day | ORAL | Status: DC
  Administered 2023-02-07 – 2023-02-10 (×4): 5 mg via ORAL
  Filled 2023-02-04 (×4): qty 1

## 2023-02-04 MED ORDER — MIDODRINE HCL 5 MG PO TABS
5.0000 mg | ORAL_TABLET | Freq: Every day | ORAL | Status: DC
  Administered 2023-02-06 – 2023-02-10 (×5): 5 mg via ORAL
  Filled 2023-02-04 (×6): qty 1

## 2023-02-04 MED ORDER — IOHEXOL 350 MG/ML SOLN
100.0000 mL | Freq: Once | INTRAVENOUS | Status: AC | PRN
  Administered 2023-02-04: 100 mL via INTRAVENOUS

## 2023-02-04 MED ORDER — SITAGLIP PHOS-METFORMIN HCL ER 100-1000 MG PO TB24: 1.0000 | ORAL_TABLET | Freq: Every day | ORAL | Status: DC

## 2023-02-04 MED ORDER — CYCLOBENZAPRINE HCL 10 MG PO TABS
10.0000 mg | ORAL_TABLET | Freq: Three times a day (TID) | ORAL | Status: DC
  Administered 2023-02-04: 10 mg via ORAL
  Filled 2023-02-04 (×2): qty 1

## 2023-02-04 NOTE — ED Triage Notes (Addendum)
Pt to ED via [facility] from [facility]. Pt encoded as CODE Stroke. EMS reports staff states slurred speech and cyanosis around 12:30pm. Unsure of LKW. Staff states pt normally A&Ox4. Pt not on blood thinners. EMS states on arrival pt hypoxic with RA sat 85%. EMS also sent EKG to MD for review for possible STEMI. Pt on NRB on arrival.

## 2023-02-04 NOTE — Assessment & Plan Note (Signed)
,   Continue with Plavix and statin

## 2023-02-04 NOTE — ED Notes (Addendum)
Portable EKG obtained in CT - Pt taken off oxygen by MD. RA sats 98%.

## 2023-02-04 NOTE — Code Documentation (Signed)
Stroke Response Nurse Documentation Code Documentation  [name] [name] is a 77 y.o. male arriving to [facility] via [facility] on 02/04/2023 with past medical hx of sleep apnea, GI bleed, HLD, DM, hypercholesteremia, HTN, dysphagia, CHF. On No antithrombotic per med list from EMS. Code stroke was activated by EMS.   Patient from [facility] where he was LKW at an unknown time and presenting with slurred speech. Per EMS, call to EMS due to shortness of breath and not talking at baseline per staff. Staff was unable to provide a last seen normal. Patient was cyanotic and noted to have slurred speech with EMS and a code stroke was activated.   Stroke team at the bedside on patient arrival. Patient cleared for CT by Dr. Jori Moll. Patient to CT with team. NIHSS 9 initially, see documentation for details and code stroke times. Patient with left facial droop, bilateral leg weakness, and dysarthria  on exam. Bilateral weakness noted on initial NIHSS. NIH 3 on subsequent NIHSS. The following imaging was completed:  CT Head. Patient is not a candidate for IV Thrombolytic due to unknown LKW per MD. Patient is not a candidate for IR due to exam not consistent with LVO/low suspicion for stroke, per MD.   Care Plan: q2h NIHSS and vital signs, swallow screen per protocol.   Bedside handoff with ED RN Lynn Ito.    Charise Carwin  Stroke Response RN

## 2023-02-04 NOTE — Assessment & Plan Note (Signed)
This was transient and self-limited, given association with cyanosis and the patient's complaint of sensation of shortness of breath for the last couple of weeks, I believe this is metabolic in nature.  I appreciate neurology evaluation, will continue to monitor patient clinically.  Neurochecks will be ordered

## 2023-02-04 NOTE — Assessment & Plan Note (Addendum)
Self-limited, I still find patient's lips a little bit dusky, would go ahead and get an ABG, unfortunately ABG attempts failed and we will do a VBG for now and proceed from there.  Maintain patient on continuous pulse oximetry.  Concern here would be either medication related hypoxia, which the patient denies any change in his medication.  Other considerations would include a reactive airway disease versus chest wall related hypoventilation.  Lets see what the ABG shows.  Continue with albuterol for now.  Currently no hypoxia is evident on the pulse oximeter.  I will get an echo with contrast

## 2023-02-04 NOTE — Consult Note (Addendum)
NEURO HOSPITALIST CONSULT NOTE   Requestig physician: Dr. Jori Moll  Reason for Consult: Acute onset of dysarthria in the context of shortness of breath with cyanosis  History obtained from:   Patient, EMS and Chart     HPI:                                                                                                                                          [name] [name] is an 77 y.o. 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 On EMS arrival he was noted to be cyanotic but also dysarthric. Staff at the facility was unable to provide a time for [facility] EMS states on arrival the patient was hypoxic with RA sat 85%. Code Stroke was called en route.    Home medications include Plavix and atorvastatin.   Past Medical History:  Diagnosis Date   Anxiety disorder 05/05/2014   unspecifed   Arthritis    CHF (congestive heart failure) (HCC)    Colonic polyp    COPD (chronic obstructive pulmonary disease) ([place])    Depression    Diabetes mellitus without complication (HCC)    Dysphagia    Dyspnea    Elevated prostate specific antigen (PSA)    Essential (primary) hypertension 05/05/2014   GERD (gastroesophageal reflux disease)    GI bleed    due to diverticulitis    Heart murmur    hx of 1960s   Hemorrhoids    History of thyroid cancer 05/05/2014   Hypercholesteremia    Hyperlipidemia    Hypertension    Hypothyroidism 05/05/2014   Male erectile dysfunction 12/29/2012   Malignant neoplasm of prostate ([place])    10/30/2011 T1c - Identified by needle biopsy . Gleason 7 (3+4) 5 of 12 cores,  Bilateral    Pneumonia    Prediabetes 06/22/2017   Sleep apnea    CPAP , report on chart , not used in last 2 weeks    Urinary obstruction    not elsewhere classified   Past Surgical History:  Procedure Laterality Date   BACK SURGERY     lumbar 1987    BACK SURGERY     C-6- Plate&Pin   CATARACT EXTRACTION W/PHACO Right 09/15/2018   Procedure: CATARACT EXTRACTION PHACO AND INTRAOCULAR LENS PLACEMENT ([employer]) RIGHT;  Surgeon: Leandrew Koyanagi, MD;  Location: [place];  Service: Ophthalmology;  Laterality: Right;  Requests to be last case  CATARACT EXTRACTION W/PHACO Left 10/06/2018   Procedure: CATARACT EXTRACTION PHACO AND INTRAOCULAR LENS PLACEMENT ([place])  LEFT;  Surgeon: Leandrew Koyanagi, MD;  Location: [place];  Service: Ophthalmology;  Laterality: Left;   CERVICAL DISC SURGERY     COLONOSCOPY     10/06/2011, 06/05/2006, 05/06/2002 adematous polyps: CBF 09/2014; Recall Ltr mailed 02/27/2015 (dw)   COLONOSCOPY WITH PROPOFOL N/A 07/06/2015   Procedure: COLONOSCOPY WITH PROPOFOL;  Surgeon: Manya Silvas, MD;  Location: [facility];  Service: Endoscopy;  Laterality: N/A;   COLONOSCOPY WITH PROPOFOL N/A 09/01/2018   Procedure: COLONOSCOPY WITH PROPOFOL;  Surgeon: Manya Silvas, MD;  Location: [facility] ENDOSCOPY;  Service: Endoscopy;  Laterality: N/A;   COLONOSCOPY WITH PROPOFOL N/A 04/17/2020   Procedure: COLONOSCOPY WITH PROPOFOL;  Surgeon: Lucilla Lame, MD;  Location: [facility] ENDOSCOPY;  Service: Endoscopy;  Laterality: N/A;   ESOPHAGOGASTRODUODENOSCOPY (EGD) WITH PROPOFOL N/A 04/17/2020   Procedure: ESOPHAGOGASTRODUODENOSCOPY (EGD) WITH PROPOFOL;  Surgeon: Lucilla Lame, MD;  Location: [facility] ENDOSCOPY;  Service: Endoscopy;  Laterality: N/A;   EYE SURGERY     HERNIA REPAIR     umbilical hernia repair    LAMINECTOMY FOR EXCISION / EVACUATION INTRASPINAL LESION     lumbar   OTHER SURGICAL HISTORY     right rotator cuff surgery    OTHER SURGICAL HISTORY      bilateral tubes in ears    POSTERIOR CERVICAL LAMINECTOMY N/A 10/27/2020   Procedure: POSTERIOR CERVICAL LAMINECTOMY CERVICAL THREE-CERVICAL SIX;  Surgeon: Judith Part, MD;  Location: [place];  Service: Neurosurgery;  Laterality: N/A;   POSTERIOR LAMINECTOMY / DECOMPRESSION CERVICAL SPINE     PROSTATE BIOPSY     10/22/2011 Volume:55.8 cc's, PSA:4.5, Free PSA:12%   ROBOT ASSISTED LAPAROSCOPIC RADICAL PROSTATECTOMY  12/15/2011   Procedure: ROBOTIC ASSISTED LAPAROSCOPIC RADICAL PROSTATECTOMY LEVEL 2;  Surgeon: Dutch Gray, MD;  Location: [facility];  Service: Urology;  Laterality: N/A;       ROTATOR CUFF REPAIR Right    SHOULDER ARTHROSCOPY WITH SUBACROMIAL DECOMPRESSION, ROTATOR CUFF REPAIR AND BICEP TENDON REPAIR Left 11/07/2021   Procedure: SHOULDER ARTHROSCOPY WITH DEBRIDEMENT, DECOMPRESSION, ROTATOR CUFF REPAIR AND BICEPS TENODESIS.;  Surgeon: Corky Mull, MD;  Location: [facility];  Service: Orthopedics;  Laterality: Left;   TONSILLECTOMY     TOTAL THYROIDECTOMY      Family History  Problem Relation Age of Onset   Hypertension Mother    Anxiety disorder Mother    Stroke Mother        "mini strokes"   Coronary artery disease Mother    Heart attack Mother    Emphysema Father    Heart disease Father    Scoliosis Father    Coronary artery disease Father    GU problems Neg Hx    Kidney disease Neg Hx    Prostate cancer Neg Hx             Social History:  reports that he quit smoking about 4 years ago. His smoking use included cigarettes. He has a 17.50 pack-year smoking history. He has never used smokeless tobacco. He reports that he does not currently use alcohol after a past usage of about 8.0 standard drinks of alcohol per week. He reports that he does not use drugs.  No Known Allergies  HOME MEDICATIONS:  No current facility-administered medications on file  prior to encounter.   Current Outpatient Medications on File Prior to Encounter  Medication Sig Dispense Refill   Benzocaine-Menthol 15-3.6 MG LOZG Use as directed 1 lozenge in the mouth or throat every 4 (four) hours as needed (sore throat).     bisacodyl (DULCOLAX) 10 MG suppository Place 10 mg rectally daily as needed for moderate constipation.     HYDROcodone-acetaminophen (NORCO/VICODIN) 5-325 MG tablet Take 1 tablet by mouth every 8 (eight) hours as needed. 3 tablet 0   hydrocortisone 2.5 % cream Apply 1 application topically every 12 (twelve) hours as needed (per rectum, hemorrhoids).     MIRVASO 0.33 % GEL Apply 1 Application topically daily. Apply to face daily     NYSTATIN powder Apply topically.     senna-docusate (SENOKOT-S) 8.6-50 MG tablet Take 1 tablet by mouth 2 (two) times daily.     simethicone (MYLICON) 0000000 MG chewable tablet Chew 125 mg by mouth 2 (two) times daily.     albuterol (VENTOLIN HFA) 108 (90 Base) MCG/ACT inhaler Inhale 2 puffs into the lungs every 6 (six) hours as needed for wheezing or shortness of breath.     Artificial Tear Solution (GENTEAL TEARS) 0.1-0.2-0.3 % SOLN Place 1 drop into both eyes in the morning, at noon, in the evening, and at bedtime.     atorvastatin (LIPITOR) 40 MG tablet Take 1 tablet (40 mg total) by mouth daily. 30 tablet 0   clopidogrel (PLAVIX) 75 MG tablet Take 1 tablet (75 mg total) by mouth daily. 30 tablet 2   cyclobenzaprine (FLEXERIL) 10 MG tablet Take 10 mg by mouth 3 (three) times daily.     dicyclomine (BENTYL) 10 MG capsule Take 10 mg by mouth 3 (three) times daily as needed (abdominal pain).     diphenhydrAMINE (BENADRYL) 25 mg capsule Take 25 mg by mouth every 6 (six) hours as needed.     donepezil (ARICEPT) 10 MG tablet Take 10 mg by mouth at bedtime.     ergocalciferol (VITAMIN D2) 1.25 MG (50000 UT) capsule Take 50,000 Units by mouth every Wednesday. Take every Monday and Wednesday     FLUoxetine (PROZAC) 40 MG capsule Take  40 mg by mouth daily.     fluticasone-salmeterol (WIXELA INHUB) 250-50 MCG/ACT AEPB Inhale 1 puff into the lungs in the morning and at bedtime.     furosemide (LASIX) 40 MG tablet Take 40 mg by mouth 2 (two) times daily.     insulin lispro (HUMALOG) 100 UNIT/ML KwikPen Inject into the skin in the morning, at noon, in the evening, and at bedtime.     isosorbide dinitrate (ISORDIL) 10 MG tablet Take 1 tablet (10 mg total) by mouth 3 (three) times daily. 90 tablet 1   ketoconazole (NIZORAL) 2 % shampoo Apply topically once a week. thursday     levothyroxine (SYNTHROID) 200 MCG tablet Take 200 mcg by mouth daily before breakfast.     LINZESS 290 MCG CAPS capsule Take 290 mcg by mouth daily.     magnesium oxide (MAG-OX) 400 MG tablet Take 400 mg by mouth 2 (two) times daily.     midodrine (PROAMATINE) 5 MG tablet Take 5 mg by mouth daily.     Omega-3 Fatty Acids (FISH OIL) 1000 MG CAPS Take 1 capsule by mouth daily.     pantoprazole (PROTONIX) 40 MG tablet Take 1 tablet (40 mg total) by mouth daily. 30 tablet 1   polyethylene glycol (MIRALAX / GLYCOLAX)  17 g packet Take 17 g by mouth daily. Mix one tablespoon with 8oz of your favorite juice or water every day until you are having soft formed stools. Then start taking once daily if you didn't have a stool the day before. 30 each 0   potassium chloride SA (K-DUR,KLOR-CON) 20 MEQ tablet Take 20 mEq by mouth 3 (three) times daily with meals.     pregabalin (LYRICA) 150 MG capsule Take 1 capsule (150 mg total) by mouth 2 (two) times daily for 7 days, THEN 2 capsules (300 mg total) 2 (two) times daily. 134 capsule 0   SitaGLIPtin-MetFORMIN HCl [phone] MG TB24 Take 1 tablet by mouth daily. 30 tablet 2   traZODone (DESYREL) 50 MG tablet Take 0.5 tablets (25 mg total) by mouth at bedtime as needed for sleep. 5 tablet 0     ROS:                                                                                                                                        As per HPI. Detailed ROS deferred in the context of acuity of presentation.    Blood pressure (!) 147/72, pulse 62, resp. rate 15, SpO2 95 %.   General Examination:                                                                                                       Physical Exam General: Morbidly obese, NAD HEENT-  [place]/AT. Dry oral mucosa/tongue.  Lungs- Respirations unlabored at the time of evaluation.  Extremities- Pretibial edema, mild. Noncyanotic at the time of evaluation  Neurological Examination Mental Status: Awake and alert. Fully oriented x 5. Thought content appropriate in the context of his presenting symptoms.  Speech is fluent with intact naming and comprehension. There is prominent dysarthria in the context of his poorly hydrated oral mucosa. Able to follow all commands without difficulty. Cranial Nerves: II: Temporal visual fields intact with no extinction to DSS. PERRL. III,IV, VI: No ptosis. EOMI. No nystagmus. V: Temp sensation equal bilaterally VII: Smile symmetric VIII: Hearing intact to voice IX,X: No hypophonia or hoarseness XI: Symmetric XII: Midline tongue extension Motor: RUE: 5/5 RLE: 5/5 LUE: Easy fatiguability but maximum strength elicited is 5/5 LLE: Easy fatiguability but maximum strength elicited is 5/5 No pronator drift.  Sensory: Temp and FT intact x 4, except for severe loss of sensation to distal BLE in a stocking distribution. Also with allodynia to tactile stimulation of the soles  of his feet bilaterally. No extinction to DSS. Deep Tendon Reflexes: 2+ and symmetric bilateral biceps, brachioradialis and patellae Plantars: Deferred due to severe allodynia Cerebellar: No ataxia with FNF bilaterally Gait: Deferred    Lab Results: Basic Metabolic Panel: No results for input(s): "NA", "K", "CL", "CO2", "GLUCOSE", "BUN", "CREATININE", "CALCIUM", "MG", "PHOS" in the last 168 hours.  CBC: No results for input(s): "WBC", "NEUTROABS", "HGB",  "HCT", "MCV", "PLT" in the last 168 hours.  Cardiac Enzymes: No results for input(s): "CKTOTAL", "CKMB", "CKMBINDEX", "TROPONINI" in the last 168 hours.  Lipid Panel: No results for input(s): "CHOL", "TRIG", "HDL", "CHOLHDL", "VLDL", "LDLCALC" in the last 168 hours.  Imaging: DG Chest Portable 1 View  Result Date: 02/04/2023 CLINICAL DATA:  Hypoxia EXAM: PORTABLE CHEST 1 VIEW COMPARISON:  07/05/2022 FINDINGS: Mild cardiomegaly. Aortic atherosclerosis. The lungs are clear. No edema, collapse or effusion. IMPRESSION: No active disease. Mild cardiomegaly. Aortic atherosclerosis. Electronically Signed   By: Nelson Chimes M.D.   On: 02/04/2023 14:27   CT HEAD CODE STROKE WO CONTRAST  Result Date: 02/04/2023 CLINICAL DATA:  Code stroke.  Slurred speech, aphasia EXAM: CT HEAD WITHOUT CONTRAST TECHNIQUE: Contiguous axial images were obtained from the base of the skull through the vertex without intravenous contrast. RADIATION DOSE REDUCTION: This exam was performed according to the departmental dose-optimization program which includes automated exposure control, adjustment of the mA and/or kV according to patient size and/or use of iterative reconstruction technique. COMPARISON:  MR head 07/05/2022 FINDINGS: Brain: There is no acute intracranial hemorrhage, extra-axial fluid collection, or acute infarct. Parenchymal volume is stable. The ventricles are stable in size. Gray-Speak differentiation is preserved. The pituitary and suprasellar region are normal. There is no mass lesion. There is no mass effect or midline shift. Vascular: No hyperdense vessel or unexpected calcification. Skull: Normal. Negative for fracture or focal lesion. Sinuses/Orbits: There is mild mucosal thickening in the paranasal sinuses. Bilateral lens implants are in place. The globes and orbits are otherwise unremarkable. Other: None. ASPECTS [facility] Stroke Program Early CT Score) - Ganglionic level infarction (caudate, lentiform nuclei,  internal capsule, insula, M1-M3 cortex): 7 - Supraganglionic infarction (M4-M6 cortex): 3 Total score (0-10 with 10 being normal): 10 IMPRESSION: No acute intracranial pathology. Findings communicated to Dr Cheral Marker via Shea Evans at 1:55 pm. Electronically Signed   By: Valetta Mole M.D.   On: 02/04/2023 13:56     Assessment: 77 year old male with acute onset of dysarthria in the context of shortness of breath with cyanosis - Exam reveals no focal neurological deficits other than dysarthria, all or most of which is felt to be attributable to his very dry oral mucosa/tongue.  - CT head: No acute intracranial hemorrhage, extra-axial fluid collection, or acute infarct. Parenchymal volume is stable.  - CXR: No active disease. Mild cardiomegaly. Aortic atherosclerosis. - Overall impression based on patient's stated symptoms and the exam findings is that the dysarthria noted by EMS is unlikely to be due to stroke. TNK not indicated.   Recommendations: - Q2h neuro checks for 12 hours - Management of the patient's respiratory decompensation per ED - Bedside swallow evaluation by RN, then encourage PO hydration - Speech Consult after his oral mucosa is adequately hydrated.    Electronically signed: Dr. Kerney Elbe 02/04/2023, 2:30 PM

## 2023-02-04 NOTE — Assessment & Plan Note (Signed)
This is felt to be chronic, exacerbated to some degree by patient's dry mouth.  We are awaiting speech/swallow evaluation in the ER to screen out aspiration and then look forward to starting patient's diet.  I appreciate neurology evaluation in this regard and I do agree that clinical suspicion for an acute stroke is rather low.

## 2023-02-04 NOTE — Assessment & Plan Note (Signed)
Outpatient eval

## 2023-02-04 NOTE — H&P (Signed)
History and Physical    Patient: [name] [name] [id number] DOB: 1946/02/25 DOA: 02/04/2023 DOS: the patient was seen and examined on 02/04/2023 PCP: Hal Morales, DO  Patient coming from: SNF  Chief Complaint:  Chief Complaint  Patient presents with   Code Stroke   HPI: [name] [name] is a 77 y.o. male with medical history significant of early dementia.  However history is obtained from the patient, as well as from the sister at the bedside Coralyn Mark as well as from a report from the ER provider.  Per sister, when patient is not having medical issues his memory is still reasonably accurate and cannot be relied upon.  Patient describes that he has had progressive weakness in all 4 extremities due to arthritis and unspecified nerve disorder which has caused him to lose ambulation ability for the last 2 years.  Patient is therefore a resident of a nursing home.  Patient is obviously noted to be obese as well during this encounter.  Patient is wheelchair-bound and has not walked in several years and reports that he was feeling his usual self till about 2 weeks ago when he starts reporting a new onset of sensation of shortness of breath.  Sensation of shortness of breath is pretty much present all the time whether the patient is at rest or exerting himself.  He does not report any aggravating or relieving factors.  Describes it as a "panic attack ".  There is no report of chest pain fever coughing leg swelling.  Patient does report chronic cramping of the legs however.  Patient had been resting all day today and reports his last meal and water intake was actually last evening.  Patient reports waking up around afternoon and having his sensation of shortness of breath as before.  Patient subsequently describes the nursing staff at the residence calling EMS and patient being transported to the ER.  Patient reports no new acute symptoms this afternoon.  History is obtained from patient's  sister as well who spoke with social worker Hope at the nursing home.  As per the sister, EMS was called because patient was found to be lethargic and cyanotic this afternoon.  Further history is obtained from the ER provider as well whose documentation corroborates reports of sensation of shortness of breath and "dysarthria ".  Sister is currently at bedside and reports that the patient's speech is at his baseline.  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 speech as sometimes he speaks in a slow drawnout manner especially when he is thinking.  Per neuro note "EMS states on arrival the patient was hypoxic with RA sat 85% "  By all accounts, patient's presenting complaints cyanosis were not noted in the ER. "NIHSS 9 initially, see documentation for details and code stroke times. Patient with left facial droop, bilateral leg weakness, and dysarthria  on exam. Bilateral weakness noted on initial NIHSS. NIH 3 on subsequent NIHSS. "  At this time reports no complaints.  ER course is notable for patient being given supplemental oxygen but no hypoxia being documented on pulse oximetry.  S/p code stroke  Review of Systems: As mentioned in the history of present illness. All other systems reviewed and are negative. Past Medical History:  Diagnosis Date   Anxiety disorder 05/05/2014   unspecifed   Arthritis    CHF (congestive heart failure) (HCC)    Colonic polyp    COPD (chronic obstructive pulmonary disease) ([place])  Depression    Diabetes mellitus without complication ([place])    Dysphagia    Dyspnea    Elevated prostate specific antigen (PSA)    Essential (primary) hypertension 05/05/2014   GERD (gastroesophageal reflux disease)    GI bleed    due to diverticulitis    Heart murmur    hx of 1960s   Hemorrhoids    History of thyroid cancer 05/05/2014   Hypercholesteremia    Hyperlipidemia    Hypertension    Hypothyroidism 05/05/2014   Male  erectile dysfunction 12/29/2012   Malignant neoplasm of prostate ([place])    10/30/2011 T1c - Identified by needle biopsy . Gleason 7 (3+4) 5 of 12 cores, Bilateral    Pneumonia    Prediabetes 06/22/2017   Sleep apnea    CPAP , report on chart , not used in last 2 weeks    Urinary obstruction    not elsewhere classified   Past Surgical History:  Procedure Laterality Date   BACK SURGERY     lumbar 1987    BACK SURGERY     C-6- Plate&Pin   CATARACT EXTRACTION W/PHACO Right 09/15/2018   Procedure: CATARACT EXTRACTION PHACO AND INTRAOCULAR LENS PLACEMENT ([place]) RIGHT;  Surgeon: Leandrew Koyanagi, MD;  Location: [place];  Service: Ophthalmology;  Laterality: Right;  Requests to be last case   CATARACT EXTRACTION W/PHACO Left 10/06/2018   Procedure: CATARACT EXTRACTION PHACO AND INTRAOCULAR LENS PLACEMENT ([place])  LEFT;  Surgeon: Leandrew Koyanagi, MD;  Location: [place];  Service: Ophthalmology;  Laterality: Left;   CERVICAL DISC SURGERY     COLONOSCOPY     10/06/2011, 06/05/2006, 05/06/2002 adematous polyps: CBF 09/2014; Recall Ltr mailed 02/27/2015 (dw)   COLONOSCOPY WITH PROPOFOL N/A 07/06/2015   Procedure: COLONOSCOPY WITH PROPOFOL;  Surgeon: Manya Silvas, MD;  Location[facility] END[facility]COPY;  Service: Endoscopy;  Laterality: N/A;   COLONOSCOPY WITH PROPOFOL N/A 09/01/2018   Procedure: COLONOSCOPY WITH PROPOFOL;  Surgeon: Manya Silvas, MD;  Location: [facility] ENDOSCOPY;  Service: Endoscopy;  Laterality: N/A;   COLONOSCOPY WITH PROPOFOL N/A 04/17/2020   Procedure: COLONOSCOPY WITH PROPOFOL;  Surgeon: Lucilla Lame, MD;  Location: [facility] ENDOSCOPY;  Service: Endoscopy;  Laterality: N/A;   ESOPHAGOGASTRODUODENOSCOPY (EGD) WITH PROPOFOL N/A 04/17/2020   Procedure: ESOPHAGOGASTRODUODENOSCOPY (EGD) WITH PROPOFOL;  Surgeon: Lucilla Lame, MD;  Location: [facility] ENDOSCOPY;  Service: Endoscopy;  Laterality: N/A;   EYE SURGERY     HERNIA REPAIR     umbilical hernia repair     LAMINECTOMY FOR EXCISION / EVACUATION INTRASPINAL LESION     lumbar   OTHER SURGICAL HISTORY     right rotator cuff surgery    OTHER SURGICAL HISTORY     bilateral tubes in ears    POSTERIOR CERVICAL LAMINECTOMY N/A 10/27/2020   Procedure: POSTERIOR CERVICAL LAMINECTOMY CERVICAL THREE-CERVICAL SIX;  Surgeon: Judith Part, MD;  Location: [place];  Service: Neurosurgery;  Laterality: N/A;   POSTERIOR LAMINECTOMY / DECOMPRESSION CERVICAL SPINE     PROSTATE BIOPSY     10/22/2011 Volume:55.8 cc's, PSA:4.5, Free PSA:12%   ROBOT ASSISTED LAPAROSCOPIC RADICAL PROSTATECTOMY  12/15/2011   Procedure: ROBOTIC ASSISTED LAPAROSCOPIC RADICAL PROSTATECTOMY LEVEL 2;  Surgeon: Dutch Gray, MD;  Location: [facility];  Service: Urology;  Laterality: N/A;       ROTATOR CUFF REPAIR Right    SHOULDER ARTHROSCOPY WITH SUBACROMIAL DECOMPRESSION, ROTATOR CUFF REPAIR AND BICEP TENDON REPAIR Left 11/07/2021   Procedure: SHOULDER ARTHROSCOPY WITH DEBRIDEMENT, DECOMPRESSION, ROTATOR CUFF REPAIR AND BICEPS TENODESIS.;  Surgeon: Corky Mull, MD;  Location: [facility];  Service: Orthopedics;  Laterality: Left;   TONSILLECTOMY     TOTAL THYROIDECTOMY     Social History:  reports that he quit smoking about 4 years ago. His smoking use included cigarettes. He has a 17.50 pack-year smoking history. He has never used smokeless tobacco. He reports that he does not currently use alcohol after a past usage of about 8.0 standard drinks of alcohol per week. He reports that he does not use drugs.  No Known Allergies  Family History  Problem Relation Age of Onset   Hypertension Mother    Anxiety disorder Mother    Stroke Mother        "mini strokes"   Coronary artery disease Mother    Heart attack Mother    Emphysema Father    Heart disease Father    Scoliosis Father    Coronary artery disease Father    GU problems Neg Hx    Kidney disease Neg Hx    Prostate cancer Neg Hx     Prior to Admission medications   Medication  Sig Start Date End Date Taking? Authorizing Provider  albuterol (VENTOLIN HFA) 108 (90 Base) MCG/ACT inhaler Inhale 2 puffs into the lungs every 6 (six) hours as needed for wheezing or shortness of breath.   Yes [provider]  Artificial Tear Solution (GENTEAL TEARS) 0.1-0.2-0.3 % SOLN Place 1 drop into both eyes in the morning, at noon, in the evening, and at bedtime. 11/13/22  Yes [provider]  atorvastatin (LIPITOR) 40 MG tablet Take 1 tablet (40 mg total) by mouth daily. 07/06/22 02/04/23 Yes Annita Brod, MD  Benzocaine-Menthol 15-3.6 MG LOZG Use as directed 1 lozenge in the mouth or throat every 4 (four) hours as needed (sore throat).   Yes [provider]  bisacodyl (DULCOLAX) 10 MG suppository Place 10 mg rectally daily as needed for moderate constipation.   Yes [provider]  clopidogrel (PLAVIX) 75 MG tablet Take 1 tablet (75 mg total) by mouth daily. 07/06/22  Yes Annita Brod, MD  cyclobenzaprine (FLEXERIL) 10 MG tablet Take 10 mg by mouth 3 (three) times daily. 11/25/22  Yes [provider]  dicyclomine (BENTYL) 10 MG capsule Take 10 mg by mouth 3 (three) times daily as needed (abdominal pain).   Yes [provider]  diphenhydrAMINE (BENADRYL) 25 mg capsule Take 25 mg by mouth every 6 (six) hours as needed.   Yes [provider]  donepezil (ARICEPT) 10 MG tablet Take 10 mg by mouth at bedtime. 01/04/21  Yes [provider]  ergocalciferol (VITAMIN D2) 1.25 MG (50000 UT) capsule Take 50,000 Units by mouth every Wednesday. Take every Monday and Wednesday   Yes [provider]  FLUoxetine (PROZAC) 40 MG capsule Take 40 mg by mouth daily.   Yes [provider]  fluticasone-salmeterol (WIXELA INHUB) 250-50 MCG/ACT AEPB Inhale 1 puff into the lungs in the morning and at bedtime.   Yes [provider]  furosemide (LASIX) 40 MG tablet Take 40 mg by mouth 2 (two) times daily.   Yes  [provider]  HYDROcodone-acetaminophen (NORCO/VICODIN) 5-325 MG tablet Take 1 tablet by mouth every 8 (eight) hours as needed. 07/06/22  Yes Annita Brod, MD  hydrocortisone 2.5 % cream Apply 1 application topically every 12 (twelve) hours as needed (per rectum, hemorrhoids).   Yes [provider]  insulin lispro (HUMALOG) 100 UNIT/ML KwikPen Inject into the skin  in the morning, at noon, in the evening, and at bedtime. 11/25/22  Yes [provider]  isosorbide dinitrate (ISORDIL) 10 MG tablet Take 1 tablet (10 mg total) by mouth 3 (three) times daily. 07/06/22  Yes Annita Brod, MD  ketoconazole (NIZORAL) 2 % shampoo Apply topically once a week. thursday 05/13/22  Yes [provider]  levothyroxine (SYNTHROID) 150 MCG tablet Take 150 mcg by mouth daily. 01/29/23  Yes [provider]  LINZESS 290 MCG CAPS capsule Take 290 mcg by mouth daily. 06/09/22  Yes [provider]  magnesium oxide (MAG-OX) 400 MG tablet Take 400 mg by mouth 2 (two) times daily. 01/25/21  Yes [provider]  midodrine (PROAMATINE) 5 MG tablet Take 5 mg by mouth daily. Hold for SBP>100 or DBP >100   Yes [provider]  MIRVASO 0.33 % GEL Apply 1 Application topically daily. Apply to face daily 11/20/22  Yes [provider]  NYSTATIN powder Apply topically. 06/23/22  Yes [provider]  Omega-3 Fatty Acids (FISH OIL) 1000 MG CAPS Take 1 capsule by mouth daily. 11/20/22  Yes [provider]  pantoprazole (PROTONIX) 40 MG tablet Take 1 tablet (40 mg total) by mouth daily. 07/07/22  Yes Annita Brod, MD  polyethylene glycol (MIRALAX / GLYCOLAX) 17 g packet Take 17 g by mouth daily. Mix one tablespoon with 8oz of your favorite juice or water every day until you are having soft formed stools. Then start taking once daily if you didn't have a stool the day before. 11/22/22  Yes Merlyn Lot, MD  potassium chloride SA  (K-DUR,KLOR-CON) 20 MEQ tablet Take 20 mEq by mouth 3 (three) times daily with meals.   Yes [provider]  pregabalin (LYRICA) 300 MG capsule Take 300 mg by mouth 2 (two) times daily. 02/04/23  Yes [provider]  risperiDONE (RISPERDAL) 1 MG tablet Take 1 mg by mouth 2 (two) times daily.   Yes [provider]  senna-docusate (SENOKOT-S) 8.6-50 MG tablet Take 1 tablet by mouth 2 (two) times daily.   Yes [provider]  simethicone (MYLICON) 0000000 MG chewable tablet Chew 125 mg by mouth 2 (two) times daily.   Yes [provider]  SitaGLIPtin-MetFORMIN HCl [phone] MG TB24 Take 1 tablet by mouth daily. 07/06/22  Yes Annita Brod, MD  traZODone (DESYREL) 50 MG tablet Take 0.5 tablets (25 mg total) by mouth at bedtime as needed for sleep. Patient taking differently: Take 50 mg by mouth at bedtime. 07/06/22  Yes Annita Brod, MD  levothyroxine (SYNTHROID) 200 MCG tablet Take 200 mcg by mouth daily before breakfast. Patient not taking: Reported on 02/04/2023    [provider]  pregabalin (LYRICA) 150 MG capsule Take 1 capsule (150 mg total) by mouth 2 (two) times daily for 7 days, THEN 2 capsules (300 mg total) 2 (two) times daily. 07/06/22 12/03/22  Annita Brod, MD    Physical Exam: Vitals:   02/04/23 1430 02/04/23 1500 02/04/23 1600 02/04/23 1700  BP: 138/68 130/68 134/76 137/82  Pulse: (!) 57 (!) 52 (!) 58 (!) 57  Resp: 17 15 16 20   Temp:   98.2 F (36.8 C)   SpO2: 97% 99% 92% 91%   General: Patient was resting when initially encountered, woke up to verbal stimulation, is obviously morbidly obese, does not appear to be in any distress is getting 2 L/min of supplemental oxygen Has not been hypoxic in the ER.  Patient gave a  rather coherent account of last several days.  I did feel that the patient had slurring of speech during this encounter, however patient was also complaining of dry mouth because of not having eaten or drink  since last evening Respiratory exam: Limited by soft tissue, no wheezes are heard bilateral air entry vesicular Cardiovascular exam S1-S2 normal Abdomen soft nontender, distended Extremities warm without edema, patient demonstrates antigravity strength in all 4 extremities, hand strength wI could not free my fingers from patient grip - would say 5/5 Dry mucous membranes Data Reviewed:  Labs on Admission:  Results for orders placed or performed during the hospital encounter of 02/04/23 (from the past 24 hour(s))  CBG monitoring, ED     Status: Abnormal   Collection Time: 02/04/23  1:38 PM  Result Value Ref Range   Glucose-Capillary 165 (H) 70 - 99 mg/dL  Protime-INR     Status: None   Collection Time: 02/04/23  2:25 PM  Result Value Ref Range   Prothrombin Time 13.8 11.4 - 15.2 seconds   INR 1.1 0.8 - 1.2  APTT     Status: None   Collection Time: 02/04/23  2:25 PM  Result Value Ref Range   aPTT 28 24 - 36 seconds  CBC     Status: None   Collection Time: 02/04/23  2:25 PM  Result Value Ref Range   WBC 7.0 4.0 - 10.5 K/uL   RBC 4.98 4.22 - 5.81 MIL/uL   Hemoglobin 13.8 13.0 - 17.0 g/dL   HCT 43.2 39.0 - 52.0 %   MCV 86.7 80.0 - 100.0 fL   MCH 27.7 26.0 - 34.0 pg   MCHC 31.9 30.0 - 36.0 g/dL   RDW 14.7 11.5 - 15.5 %   Platelets 155 150 - 400 K/uL   nRBC 0.0 0.0 - 0.2 %  Differential     Status: None   Collection Time: 02/04/23  2:25 PM  Result Value Ref Range   Neutrophils Relative % 76 %   Neutro Abs 5.2 1.7 - 7.7 K/uL   Lymphocytes Relative 14 %   Lymphs Abs 1.0 0.7 - 4.0 K/uL   Monocytes Relative 8 %   Monocytes Absolute 0.6 0.1 - 1.0 K/uL   Eosinophils Relative 2 %   Eosinophils Absolute 0.1 0.0 - 0.5 K/uL   Basophils Relative 0 %   Basophils Absolute 0.0 0.0 - 0.1 K/uL   Immature Granulocytes 0 %   Abs Immature Granulocytes 0.03 0.00 - 0.07 K/uL  Comprehensive metabolic panel     Status: Abnormal   Collection Time: 02/04/23  2:25 PM  Result Value Ref Range    Sodium 141 135 - 145 mmol/L   Potassium 3.9 3.5 - 5.1 mmol/L   Chloride 102 98 - 111 mmol/L   CO2 29 22 - 32 mmol/L   Glucose, Bld 174 (H) 70 - 99 mg/dL   BUN 19 8 - 23 mg/dL   Creatinine, Ser 1.19 0.61 - 1.24 mg/dL   Calcium 9.2 8.9 - 10.3 mg/dL   Total Protein 7.1 6.5 - 8.1 g/dL   Albumin 3.5 3.5 - 5.0 g/dL   AST 20 15 - 41 U/L   ALT 25 0 - 44 U/L   Alkaline Phosphatase 104 38 - 126 U/L   Total Bilirubin 1.3 (H) 0.3 - 1.2 mg/dL   GFR, Estimated >60 >60 mL/min   Anion gap 10 5 - 15  Ethanol     Status: None   Collection Time: 02/04/23  2:25 PM  Result Value Ref Range   Alcohol, Ethyl (B) <10 <10 mg/dL  Troponin I (High Sensitivity)     Status: None   Collection Time: 02/04/23  2:25 PM  Result Value Ref Range   Troponin I (High Sensitivity) 7 <18 ng/L  Resp panel by RT-PCR (RSV, Flu A&B, Covid) Anterior Nasal Swab     Status: None   Collection Time: 02/04/23  2:25 PM   Specimen: Anterior Nasal Swab  Result Value Ref Range   SARS Coronavirus 2 by RT PCR NEGATIVE NEGATIVE   Influenza A by PCR NEGATIVE NEGATIVE   Influenza B by PCR NEGATIVE NEGATIVE   Resp Syncytial Virus by PCR NEGATIVE NEGATIVE  Troponin I (High Sensitivity)     Status: None   Collection Time: 02/04/23  4:15 PM  Result Value Ref Range   Troponin I (High Sensitivity) 7 <18 ng/L  D-dimer, quantitative     Status: Abnormal   Collection Time: 02/04/23  4:15 PM  Result Value Ref Range   D-Dimer, Quant 0.99 (H) 0.00 - 0.50 ug/mL-FEU   es   Radiological Exams on Admission:  CT Angio Chest PE W and/or Wo Contrast  Result Date: 02/04/2023 CLINICAL DATA:  PE EXAM: CT ANGIOGRAPHY CHEST CT ABDOMEN AND PELVIS WITH CONTRAST TECHNIQUE: Multidetector CT imaging of the chest was performed using the standard protocol during bolus administration of intravenous contrast. Multiplanar CT image reconstructions and MIPs were obtained to evaluate the vascular anatomy. Multidetector CT imaging of the abdomen and pelvis was  performed using the standard protocol during bolus administration of intravenous contrast. RADIATION DOSE REDUCTION: This exam was performed according to the departmental dose-optimization program which includes automated exposure control, adjustment of the mA and/or kV according to patient size and/or use of iterative reconstruction technique. CONTRAST:  100 mL OMNIPAQUE IOHEXOL 350 MG/ML SOLN COMPARISON:  CT chest 04/05/2019, CT abdomen and pelvis 11/22/2022 FINDINGS: CTA CHEST FINDINGS Cardiovascular: Cardiomegaly. No pericardial effusion. Dense atheromatous calcifications of the aorta and its branches. No filling defects are identified in pulmonary arteries to indicate PE. No aortic aneurysm or dissection identified. Mediastinum/Nodes: Diffuse esophageal wall thickening identified. Endoscopic correlation recommended non emergently to assess for possible esophageal mucosal abnormalities. No suspicious adenopathy. Thyroid is not identified. Airways are patent. Lungs/Pleura: Linear bibasilar subsegmental atelectasis or scarring. No pneumonia or pulmonary edema. No pneumothorax or pleural effusion. Musculoskeletal: Thoracolumbosacral degenerative disc disease. No chest wall abnormalities. Osseous structures are osteopenic. Review of the MIP images confirms the above findings. CT ABDOMEN and PELVIS FINDINGS Hepatobiliary: Subcentimeter liver in the medial left lobe is too small to characterize. No biliary ductal dilatation. No enhancing abnormalities. Unremarkable gallbladder. Pancreas: Unremarkable. No pancreatic ductal dilatation or surrounding inflammatory changes. Spleen: Normal in size without focal abnormality. Adrenals/Urinary Tract: 1.1 cm left adrenal nodule consistent with an adenoma is stable finding. Bilateral renal cysts identified, the largest 3 cm midpole right. No hydronephrosis or nephrolithiasis. Unremarkable urinary bladder. Stomach/Bowel: No bowel dilatation to suggest obstruction. Diverticulosis  of the descending and sigmoid. No appendicitis. Appendix is not identified. No abscesses. Vascular/Lymphatic: Aortic atherosclerosis. No enlarged abdominal or pelvic lymph nodes. Reproductive: Prostate is diminutive. Other: No abdominal wall hernia or abnormality. No abdominopelvic ascites. Musculoskeletal: Thoracolumbosacral degenerative changes. Review of the MIP images confirms the above findings. IMPRESSION: 1. No PE. 2. Cardiomegaly. 3. Abnormal esophagus with diffuse wall thickening. This can be evaluated endoscopically. 4. Numerous renal cysts. 5. Subcentimeter hypodensity liver, too small to characterize. 6. Diverticulosis. Electronically Signed   By:  Sammie Bench M.D.   On: 02/04/2023 18:15   CT ABDOMEN PELVIS W CONTRAST  Result Date: 02/04/2023 CLINICAL DATA:  PE EXAM: CT ANGIOGRAPHY CHEST CT ABDOMEN AND PELVIS WITH CONTRAST TECHNIQUE: Multidetector CT imaging of the chest was performed using the standard protocol during bolus administration of intravenous contrast. Multiplanar CT image reconstructions and MIPs were obtained to evaluate the vascular anatomy. Multidetector CT imaging of the abdomen and pelvis was performed using the standard protocol during bolus administration of intravenous contrast. RADIATION DOSE REDUCTION: This exam was performed according to the departmental dose-optimization program which includes automated exposure control, adjustment of the mA and/or kV according to patient size and/or use of iterative reconstruction technique. CONTRAST:  100 mL OMNIPAQUE IOHEXOL 350 MG/ML SOLN COMPARISON:  CT chest 04/05/2019, CT abdomen and pelvis 11/22/2022 FINDINGS: CTA CHEST FINDINGS Cardiovascular: Cardiomegaly. No pericardial effusion. Dense atheromatous calcifications of the aorta and its branches. No filling defects are identified in pulmonary arteries to indicate PE. No aortic aneurysm or dissection identified. Mediastinum/Nodes: Diffuse esophageal wall thickening identified.  Endoscopic correlation recommended non emergently to assess for possible esophageal mucosal abnormalities. No suspicious adenopathy. Thyroid is not identified. Airways are patent. Lungs/Pleura: Linear bibasilar subsegmental atelectasis or scarring. No pneumonia or pulmonary edema. No pneumothorax or pleural effusion. Musculoskeletal: Thoracolumbosacral degenerative disc disease. No chest wall abnormalities. Osseous structures are osteopenic. Review of the MIP images confirms the above findings. CT ABDOMEN and PELVIS FINDINGS Hepatobiliary: Subcentimeter liver in the medial left lobe is too small to characterize. No biliary ductal dilatation. No enhancing abnormalities. Unremarkable gallbladder. Pancreas: Unremarkable. No pancreatic ductal dilatation or surrounding inflammatory changes. Spleen: Normal in size without focal abnormality. Adrenals/Urinary Tract: 1.1 cm left adrenal nodule consistent with an adenoma is stable finding. Bilateral renal cysts identified, the largest 3 cm midpole right. No hydronephrosis or nephrolithiasis. Unremarkable urinary bladder. Stomach/Bowel: No bowel dilatation to suggest obstruction. Diverticulosis of the descending and sigmoid. No appendicitis. Appendix is not identified. No abscesses. Vascular/Lymphatic: Aortic atherosclerosis. No enlarged abdominal or pelvic lymph nodes. Reproductive: Prostate is diminutive. Other: No abdominal wall hernia or abnormality. No abdominopelvic ascites. Musculoskeletal: Thoracolumbosacral degenerative changes. Review of the MIP images confirms the above findings. IMPRESSION: 1. No PE. 2. Cardiomegaly. 3. Abnormal esophagus with diffuse wall thickening. This can be evaluated endoscopically. 4. Numerous renal cysts. 5. Subcentimeter hypodensity liver, too small to characterize. 6. Diverticulosis. Electronically Signed   By: Sammie Bench M.D.   On: 02/04/2023 18:15   DG Chest Portable 1 View  Result Date: 02/04/2023 CLINICAL DATA:  Hypoxia  EXAM: PORTABLE CHEST 1 VIEW COMPARISON:  07/05/2022 FINDINGS: Mild cardiomegaly. Aortic atherosclerosis. The lungs are clear. No edema, collapse or effusion. IMPRESSION: No active disease. Mild cardiomegaly. Aortic atherosclerosis. Electronically Signed   By: Nelson Chimes M.D.   On: 02/04/2023 14:27   CT HEAD CODE STROKE WO CONTRAST  Result Date: 02/04/2023 CLINICAL DATA:  Code stroke.  Slurred speech, aphasia EXAM: CT HEAD WITHOUT CONTRAST TECHNIQUE: Contiguous axial images were obtained from the base of the skull through the vertex without intravenous contrast. RADIATION DOSE REDUCTION: This exam was performed according to the departmental dose-optimization program which includes automated exposure control, adjustment of the mA and/or kV according to patient size and/or use of iterative reconstruction technique. COMPARISON:  MR head 07/05/2022 FINDINGS: Brain: There is no acute intracranial hemorrhage, extra-axial fluid collection, or acute infarct. Parenchymal volume is stable. The ventricles are stable in size. Gray-Latour differentiation is preserved. The pituitary and suprasellar region are normal. There  is no mass lesion. There is no mass effect or midline shift. Vascular: No hyperdense vessel or unexpected calcification. Skull: Normal. Negative for fracture or focal lesion. Sinuses/Orbits: There is mild mucosal thickening in the paranasal sinuses. Bilateral lens implants are in place. The globes and orbits are otherwise unremarkable. Other: None. ASPECTS [facility] Stroke Program Early CT Score) - Ganglionic level infarction (caudate, lentiform nuclei, internal capsule, insula, M1-M3 cortex): 7 - Supraganglionic infarction (M4-M6 cortex): 3 Total score (0-10 with 10 being normal): 10 IMPRESSION: No acute intracranial pathology. Findings communicated to Dr Cheral Marker via Shea Evans at 1:55 pm. Electronically Signed   By: Valetta Mole M.D.   On: 02/04/2023 13:56    EKG: Independently reviewed.  Shows sinus  bradycardia with wide QRS, this is consistent with the EKG done in August 2023.       Assessment and Plan: * Lethargy This was transient and self-limited, given association with cyanosis and the patient's complaint of sensation of shortness of breath for the last couple of weeks, I believe this is metabolic in nature.  I appreciate neurology evaluation, will continue to monitor patient clinically.  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  Bradycardia This is sinus, continue with clinical monitoring.  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  We are awaiting speech/swallow evaluation in the ER to screen out aspiration and then look forward to starting patient's diet.  I appreciate neurology evaluation in this regard and I do agree that clinical suspicion for an acute stroke is rather low.  Cyanosis Self-limited, I still find patient's lips a little bit dusky, would go ahead and get an ABG, unfortunately ABG attempts failed and we will do a VBG for now and proceed from there.  Maintain patient on continuous pulse oximetry.  Concern here would be either medication related hypoxia, which the patient denies any change in his medication.  Other considerations would include a reactive airway disease versus chest wall related hypoventilation.  Lets see what the ABG shows.  Continue with albuterol for now.  Currently no hypoxia is evident on the pulse oximeter      Advance Care Planning:   Code Status: Prior patient has multiple CODE STATUS is documented in the chart at [facility].  At this time I discussed CODE STATUS with the patient  in front of his sister and they could not come up with a previous definite diagnosis of DNR/DNI.  Therefore patient will be put on full CODE STATUS by default.  Consults: Neurology  Family Communication: Sister Coralyn Mark was at the bedside for part of this encounter  Severity of Illness: The appropriate patient status for this patient is OBSERVATION. Observation status is judged to be reasonable and necessary in order to provide the required intensity of service to ensure the patient's safety. The patient's presenting symptoms, physical exam findings, and initial radiographic and laboratory data in the context of their medical condition is felt to place them at decreased risk for further clinical deterioration. Furthermore, it is anticipated that the patient will be medically stable for discharge from the hospital within 2 midnights of admission.   Author: Gertie Fey, MD 02/04/2023 8:04 PM  For on call review [email or web address].

## 2023-02-04 NOTE — ED Provider Notes (Signed)
[facility] Provider Note    Event Date/Time   First MD Initiated Contact with Patient 02/04/23 1338     (approximate)   History   Code Stroke   HPI  [name] is a 77 y.o. male past medical history significant for OSA on CPAP, dementia, diabetes, hypertension, frequent falls, who presents to the emergency department with shortness of breath and dysarthria.  Patient was initially called out from her code stroke with EMS.  Unknown last known well.  States that he got up this morning and was having difficulty catching his breath.  Slurring of speech.  Unknown last well.  No extremity numbness or weakness.  Denies any falls or trauma.  States that he has had a history of dysarthria and has been evaluated by neurology recently.  States that has been coming and going over the past 3 weeks.  Believes that he seen speech pathology but he is not certain.     Physical Exam   Triage Vital Signs: ED Triage Vitals  Enc Vitals Group     BP      Pulse      Resp      Temp      Temp src      SpO2      Weight      Height      Head Circumference      Peak Flow      Pain Score      Pain Loc      Pain Edu?      Excl. in [place]?     Most recent vital signs: Vitals:   02/04/23 1430 02/04/23 1500  BP: 138/68 130/68  Pulse: (!) 57 (!) 52  Resp: 17 15  SpO2: 97% 99%    Physical Exam Constitutional:      Appearance: He is well-developed. He is obese.  HENT:     Head: Atraumatic.  Eyes:     Conjunctiva/sclera: Conjunctivae normal.  Cardiovascular:     Rate and Rhythm: Regular rhythm.  Pulmonary:     Effort: No respiratory distress.  Abdominal:     Tenderness: There is no abdominal tenderness.  Musculoskeletal:     Cervical back: Normal range of motion.  Skin:    General: Skin is warm.     Capillary Refill: Capillary refill takes less than 2 seconds.  Neurological:     Mental Status: He is alert.     GCS: GCS eye subscore is 4. GCS verbal subscore  is 5. GCS motor subscore is 6.     Cranial Nerves: Cranial nerves 2-12 are intact.     Sensory: Sensation is intact.     Motor: Motor function is intact.     Comments: Dysarthria with slurred speech.  Able to name objects.     IMPRESSION / MDM / ASSESSMENT AND PLAN / ED COURSE  I reviewed the triage vital signs and the nursing notes.  Patient on arrival was activated code stroke for consideration of TNK.  Ultimately after being evaluated by neurology did not feel that the patient was a good TNK candidate.  Low suspicion for LVO, do not feel that CTA is necessary at this time.  On chart review patient has a history of dementia that has been diagnosed in history of hallucinations.  Dysarthria that has been intermittent but I do not see any ongoing dysarthria.  Patient has had an MRI as an outpatient given concern for possible TIAs which  was negative.  Differential diagnosis including pneumonia, ACS, pulmonary embolism, CVA  EKG  I, Nathaniel Man, the attending physician, personally viewed and interpreted this ECG.   Rate: Normal  Rhythm: Normal sinus  Axis: Normal  Intervals: Bundle branch block, LVH  ST&T Change: None No change when compared to prior EKG   No tachycardic or bradycardic dysrhythmias while on cardiac telemetry.  RADIOLOGY I independently reviewed imaging, my interpretation of imaging: CT head w/o acute findings.   LABS (all labs ordered are listed, but only abnormal results are displayed) Labs interpreted as -    Labs Reviewed  COMPREHENSIVE METABOLIC PANEL - Abnormal; Notable for the following components:      Result Value   Glucose, Bld 174 (*)    Total Bilirubin 1.3 (*)    All other components within normal limits  CBG MONITORING, ED - Abnormal; Notable for the following components:   Glucose-Capillary 165 (*)    All other components within normal limits  RESP PANEL BY RT-PCR (RSV, FLU A&B, COVID)  RVPGX2  PROTIME-INR  APTT  CBC  DIFFERENTIAL   ETHANOL  D-DIMER, QUANTITATIVE  CBG MONITORING, ED  I-STAT CREATININE, ED  TROPONIN I (HIGH SENSITIVITY)  TROPONIN I (HIGH SENSITIVITY)    TREATMENT    MDM    On arrival patient was activated code stroke and was evaluated by neurology.  Ultimately was not TNK candidate.  Patient with ongoing dysarthria.  Lab work with no leukocytosis or anemia.  Creatinine at baseline.  Normal INR.  COVID and influenza testing are negative.  Chest x-ray with mild cardiomegaly but no signs of pneumonia or pulmonary edema.  On reevaluation able to titrate the patient off of oxygen.  Stable on room air at 92%.  No significant wheezing on my exam.  Does have ongoing dysarthria which he states is worse than his normal.  Reconsulted neurology to discussed further possible imaging with MRI.  Added on a D-dimer given his shortness of breath to further evaluate for possible pulmonary embolism.   PROCEDURES:  Critical Care performed: yes  .Critical Care  Performed by: Nathaniel Man, MD Authorized by: Nathaniel Man, MD   Critical care provider statement:    Critical care time (minutes):  30   Critical care time was exclusive of:  Separately billable procedures and treating other patients   Critical care was necessary to treat or prevent imminent or life-threatening deterioration of the following conditions:  CNS failure or compromise   Critical care was time spent personally by me on the following activities:  Development of treatment plan with patient or surrogate, discussions with consultants, evaluation of patient's response to treatment, examination of patient, ordering and review of laboratory studies, ordering and review of radiographic studies, ordering and performing treatments and interventions, pulse oximetry, re-evaluation of patient's condition and review of old charts   Patient's presentation is most consistent with acute presentation with potential threat to life or bodily  function.   MEDICATIONS ORDERED IN ED: Medications  sodium chloride flush (NS) 0.9 % injection 3 mL (3 mLs Intravenous Not Given 02/04/23 1412)    FINAL CLINICAL IMPRESSION(S) / ED DIAGNOSES   Final diagnoses:  Slurred speech  Dyspnea, unspecified type     Rx / DC Orders   ED Discharge Orders     None        Note:  This document was prepared using Dragon voice recognition software and may include unintentional dictation errors.   Nathaniel Man, MD 02/04/23 1556

## 2023-02-04 NOTE — Assessment & Plan Note (Signed)
This is sinus, continue with clinical monitoring.

## 2023-02-04 NOTE — ED Provider Notes (Signed)
5:12 PM Assumed care for off going team.   Blood pressure 134/76, pulse (!) 58, resp. rate 16, SpO2 92 %.  See their HPI for full report but in brief pending trop/ddimer  Plan is stable but D-dimer is elevated therefore CT imaging ordered of PE.  On reassessment he also does have some abdominal distention and so I added on a CT abdomen.  Discussed with Dr. Cheral Marker and does not feel he needs an MRI   - Q2h neuro checks for 12 hours - Management of the patient's respiratory decompensation per ED - Bedside swallow evaluation by RN, then encourage PO hydration - Speech Consult after his oral mucosa is adequately hydrated.    IMPRESSION: 1. No PE. 2. Cardiomegaly. 3. Abnormal esophagus with diffuse wall thickening. This can be evaluated endoscopically. 4. Numerous renal cysts. 5. Subcentimeter hypodensity liver, too small to characterize. 6. Diverticulosis.  Will discuss with the hospitalist for admission.  Patient will need swallowing screen speech consult.  Suspect that he could be aspirating and this is causing episodes of transient shortness of breath.   Vanessa Kendrick, MD 02/04/23 [phone]

## 2023-02-04 NOTE — Assessment & Plan Note (Signed)
Will give a liter saline for now as patient is pending swallow screen

## 2023-02-04 NOTE — ED Notes (Signed)
Pt to CT 1 with this RN and Claiborne Billings, Stroke RN

## 2023-02-04 NOTE — Assessment & Plan Note (Signed)
Patient has prior history of esophageal strictures documented in the chart, this will need further evaluation as an outpatient.

## 2023-02-04 NOTE — Progress Notes (Signed)
PHARMACIST - PHYSICIAN COMMUNICATION  CONCERNING:  Enoxaparin (Lovenox) for DVT Prophylaxis    RECOMMENDATION: Patient was prescribed enoxaprin 40mg  q24 hours for VTE prophylaxis.   Filed Weights   02/04/23 2027  Weight: 115 kg (253 lb 8.5 oz)    Body mass index is 32.53 kg/m.  Estimated Creatinine Clearance: 69 mL/min (by C-G formula based on SCr of 1.19 mg/dL).   Based on [place] patient is candidate for enoxaparin 0.5mg /kg TBW SQ every 24 hours based on BMI being >30.  Patient is candidate for enoxaparin 30mg  every 24 hours based on CrCl <57ml/min or Weight <45kg  DESCRIPTION: Pharmacy has adjusted enoxaparin dose per [facility] policy.  Patient is now receiving enoxaparin 57.5 mg every 24 hours    Darrick Penna, PharmD Clinical Pharmacist  02/04/2023 8:42 PM

## 2023-02-04 NOTE — Progress Notes (Signed)
   02/04/23 1500  Spiritual Encounters  Type of Visit Initial  Care provided to: Patient  Conversation partners present during encounter Nurse  Referral source Nurse (RN/NT/LPN)  Reason for visit Code  OnCall Visit Yes   Chaplain responded to code stroke. Patient with care team and no family present. Chaplain services are available for follow up as needed.

## 2023-02-05 ENCOUNTER — Other Ambulatory Visit: Payer: Self-pay

## 2023-02-05 ENCOUNTER — Encounter: Payer: Self-pay | Admitting: Internal Medicine

## 2023-02-05 ENCOUNTER — Observation Stay (HOSPITAL_COMMUNITY)
Admit: 2023-02-05 | Discharge: 2023-02-05 | Disposition: A | Discharge status: Home / Self Care | Payer: Medicare Other | Attending: Internal Medicine | Admitting: Internal Medicine

## 2023-02-05 ENCOUNTER — Inpatient Hospital Stay: Payer: Medicare Other

## 2023-02-05 DIAGNOSIS — R471 Dysarthria and anarthria: Secondary | ICD-10-CM | POA: Diagnosis present

## 2023-02-05 DIAGNOSIS — I5032 Chronic diastolic (congestive) heart failure: Secondary | ICD-10-CM | POA: Diagnosis present

## 2023-02-05 DIAGNOSIS — I517 Cardiomegaly: Secondary | ICD-10-CM | POA: Diagnosis not present

## 2023-02-05 DIAGNOSIS — G9340 Encephalopathy, unspecified: Secondary | ICD-10-CM | POA: Diagnosis not present

## 2023-02-05 DIAGNOSIS — R5383 Other fatigue: Secondary | ICD-10-CM | POA: Diagnosis not present

## 2023-02-05 DIAGNOSIS — M19029 Primary osteoarthritis, unspecified elbow: Secondary | ICD-10-CM | POA: Diagnosis present

## 2023-02-05 DIAGNOSIS — E860 Dehydration: Secondary | ICD-10-CM

## 2023-02-05 DIAGNOSIS — K2289 Other specified disease of esophagus: Secondary | ICD-10-CM | POA: Diagnosis not present

## 2023-02-05 DIAGNOSIS — R4781 Slurred speech: Secondary | ICD-10-CM | POA: Diagnosis present

## 2023-02-05 DIAGNOSIS — E7800 Pure hypercholesterolemia, unspecified: Secondary | ICD-10-CM | POA: Diagnosis present

## 2023-02-05 DIAGNOSIS — G928 Other toxic encephalopathy: Secondary | ICD-10-CM | POA: Diagnosis present

## 2023-02-05 DIAGNOSIS — R29810 Facial weakness: Secondary | ICD-10-CM | POA: Diagnosis present

## 2023-02-05 DIAGNOSIS — G4733 Obstructive sleep apnea (adult) (pediatric): Secondary | ICD-10-CM | POA: Diagnosis present

## 2023-02-05 DIAGNOSIS — L03113 Cellulitis of right upper limb: Secondary | ICD-10-CM | POA: Diagnosis present

## 2023-02-05 DIAGNOSIS — Z794 Long term (current) use of insulin: Secondary | ICD-10-CM | POA: Diagnosis not present

## 2023-02-05 DIAGNOSIS — K219 Gastro-esophageal reflux disease without esophagitis: Secondary | ICD-10-CM | POA: Diagnosis present

## 2023-02-05 DIAGNOSIS — T380X5A Adverse effect of glucocorticoids and synthetic analogues, initial encounter: Secondary | ICD-10-CM | POA: Diagnosis not present

## 2023-02-05 DIAGNOSIS — I110 Hypertensive heart disease with heart failure: Secondary | ICD-10-CM | POA: Diagnosis present

## 2023-02-05 DIAGNOSIS — J449 Chronic obstructive pulmonary disease, unspecified: Secondary | ICD-10-CM | POA: Diagnosis present

## 2023-02-05 DIAGNOSIS — E1141 Type 2 diabetes mellitus with diabetic mononeuropathy: Secondary | ICD-10-CM | POA: Diagnosis present

## 2023-02-05 DIAGNOSIS — Z1152 Encounter for screening for COVID-19: Secondary | ICD-10-CM | POA: Diagnosis not present

## 2023-02-05 DIAGNOSIS — E1165 Type 2 diabetes mellitus with hyperglycemia: Secondary | ICD-10-CM | POA: Diagnosis not present

## 2023-02-05 DIAGNOSIS — K7689 Other specified diseases of liver: Secondary | ICD-10-CM | POA: Diagnosis present

## 2023-02-05 DIAGNOSIS — K222 Esophageal obstruction: Secondary | ICD-10-CM | POA: Diagnosis present

## 2023-02-05 DIAGNOSIS — F0394 Unspecified dementia, unspecified severity, with anxiety: Secondary | ICD-10-CM | POA: Diagnosis present

## 2023-02-05 DIAGNOSIS — M109 Gout, unspecified: Secondary | ICD-10-CM | POA: Diagnosis present

## 2023-02-05 DIAGNOSIS — R001 Bradycardia, unspecified: Secondary | ICD-10-CM | POA: Diagnosis present

## 2023-02-05 DIAGNOSIS — E890 Postprocedural hypothyroidism: Secondary | ICD-10-CM | POA: Diagnosis present

## 2023-02-05 LAB — BLOOD GAS, VENOUS
Acid-Base Excess: 4 mmol/L — ABNORMAL HIGH (ref 0.0–2.0)
Bicarbonate: 30.6 mmol/L — ABNORMAL HIGH (ref 20.0–28.0)
O2 Saturation: 68.8 %
Patient temperature: 37
pCO2, Ven: 53 mmHg (ref 44–60)
pH, Ven: 7.37 (ref 7.25–7.43)
pO2, Ven: 42 mmHg (ref 32–45)

## 2023-02-05 LAB — CBC
HCT: 42.4 % (ref 39.0–52.0)
Hemoglobin: 13.8 g/dL (ref 13.0–17.0)
MCH: 28.3 pg (ref 26.0–34.0)
MCHC: 32.5 g/dL (ref 30.0–36.0)
MCV: 86.9 fL (ref 80.0–100.0)
Platelets: 144 10*3/uL — ABNORMAL LOW (ref 150–400)
RBC: 4.88 MIL/uL (ref 4.22–5.81)
RDW: 14.8 % (ref 11.5–15.5)
WBC: 8.8 10*3/uL (ref 4.0–10.5)
nRBC: 0 % (ref 0.0–0.2)

## 2023-02-05 LAB — CBG MONITORING, ED
Glucose-Capillary: 182 mg/dL — ABNORMAL HIGH (ref 70–99)
Glucose-Capillary: 188 mg/dL — ABNORMAL HIGH (ref 70–99)

## 2023-02-05 LAB — HEMOGLOBIN A1C
Hgb A1c MFr Bld: 10.4 % — ABNORMAL HIGH (ref 4.8–5.6)
Mean Plasma Glucose: 252 mg/dL

## 2023-02-05 LAB — GLUCOSE, CAPILLARY
Glucose-Capillary: 128 mg/dL — ABNORMAL HIGH (ref 70–99)
Glucose-Capillary: 144 mg/dL — ABNORMAL HIGH (ref 70–99)
Glucose-Capillary: 160 mg/dL — ABNORMAL HIGH (ref 70–99)

## 2023-02-05 LAB — BASIC METABOLIC PANEL
Anion gap: 9 (ref 5–15)
BUN: 22 mg/dL (ref 8–23)
CO2: 26 mmol/L (ref 22–32)
Calcium: 9.2 mg/dL (ref 8.9–10.3)
Chloride: 103 mmol/L (ref 98–111)
Creatinine, Ser: 1.04 mg/dL (ref 0.61–1.24)
GFR, Estimated: >60 mL/min (ref >60)
Glucose, Bld: 168 mg/dL — ABNORMAL HIGH (ref 70–99)
Potassium: 3.8 mmol/L (ref 3.5–5.1)
Sodium: 138 mmol/L (ref 135–145)

## 2023-02-05 LAB — ECHOCARDIOGRAM COMPLETE
S' Lateral: 3.2 cm
Weight: 4056.46 oz

## 2023-02-05 LAB — TSH: TSH: 3.812 u[IU]/mL (ref 0.350–4.500)

## 2023-02-05 LAB — AMMONIA: Ammonia: 13 umol/L (ref 9–35)

## 2023-02-05 NOTE — Evaluation (Addendum)
Clinical/Bedside Swallow Evaluation Patient Details  Name: [name] [name] MRN: [id number] Date of Birth: 1945-12-13  Today's Date: 02/05/2023 Time: SLP Start Time (ACUTE ONLY): 13 SLP Stop Time (ACUTE ONLY): 1215 SLP Time Calculation (min) (ACUTE ONLY): 10 min  Past Medical History:  Past Medical History:  Diagnosis Date   Anxiety disorder 05/05/2014   unspecifed   Arthritis    CHF (congestive heart failure) (HCC)    Colonic polyp    COPD (chronic obstructive pulmonary disease) ([place])    Depression    Diabetes mellitus without complication (HCC)    Dysphagia    Dyspnea    Elevated prostate specific antigen (PSA)    Essential (primary) hypertension 05/05/2014   GERD (gastroesophageal reflux disease)    GI bleed    due to diverticulitis    Heart murmur    hx of 1960s   Hemorrhoids    History of thyroid cancer 05/05/2014   Hypercholesteremia    Hyperlipidemia    Hypertension    Hypothyroidism 05/05/2014   Male erectile dysfunction 12/29/2012   Malignant neoplasm of prostate ([place])    10/30/2011 T1c - Identified by needle biopsy . Gleason 7 (3+4) 5 of 12 cores, Bilateral    Pneumonia    Prediabetes 06/22/2017   Sleep apnea    CPAP , report on chart , not used in last 2 weeks    Urinary obstruction    not elsewhere classified   Past Surgical History:  Past Surgical History:  Procedure Laterality Date   BACK SURGERY     lumbar 1987    BACK SURGERY     C-6- Plate&Pin   CATARACT EXTRACTION W/PHACO Right 09/15/2018   Procedure: CATARACT EXTRACTION PHACO AND INTRAOCULAR LENS PLACEMENT ([place]) RIGHT;  Surgeon: Leandrew Koyanagi, MD;  Location: [place];  Service: Ophthalmology;  Laterality: Right;  Requests to be last case   CATARACT EXTRACTION W/PHACO Left 10/06/2018   Procedure: CATARACT EXTRACTION PHACO AND INTRAOCULAR LENS PLACEMENT ([place])  LEFT;  Surgeon: Leandrew Koyanagi, MD;  Location: [place];  Service: Ophthalmology;  Laterality: Left;    CERVICAL DISC SURGERY     COLONOSCOPY     10/06/2011, 06/05/2006, 05/06/2002 adematous polyps: CBF 09/2014; Recall Ltr mailed 02/27/2015 (dw)   COLONOSCOPY WITH PROPOFOL N/A 07/06/2015   Procedure: COLONOSCOPY WITH PROPOFOL;  Surgeon: Manya Silvas, MD;  Location: [facility] ENDOSCOPY;  Service: Endoscopy;  Laterality: N/A;   COLONOSCOPY WITH PROPOFOL N/A 09/01/2018   Procedure: COLONOSCOPY WITH PROPOFOL;  Surgeon: Manya Silvas, MD;  Location: [facility] ENDOSCOPY;  Service: Endoscopy;  Laterality: N/A;   COLONOSCOPY WITH PROPOFOL N/A 04/17/2020   Procedure: COLONOSCOPY WITH PROPOFOL;  Surgeon: Lucilla Lame, MD;  Location[facility]COPY;  Service: Endoscopy;  Laterality: N/A;   ESOPHAGOGASTRODUODENOSCOPY (EGD) WITH PROPOFOL N/A 04/17/2020   Procedure: ESOPHAGOGASTRODUODENOSCOPY (EGD) WITH PROPOFOL;  Surgeon: Lucilla Lame, MD;  Location: [facility] ENDOSCOPY;  Service: Endoscopy;  Laterality: N/A;   EYE SURGERY     HERNIA REPAIR     umbilical hernia repair    LAMINECTOMY FOR EXCISION / EVACUATION INTRASPINAL LESION     lumbar   OTHER SURGICAL HISTORY     right rotator cuff surgery    OTHER SURGICAL HISTORY     bilateral tubes in ears    POSTERIOR CERVICAL LAMINECTOMY N/A 10/27/2020   Procedure: POSTERIOR CERVICAL LAMINECTOMY CERVICAL THREE-CERVICAL SIX;  Surgeon: Judith Part, MD;  Location: [place];  Service: Neurosurgery;  Laterality: N/A;   POSTERIOR LAMINECTOMY / DECOMPRESSION CERVICAL SPINE  PROSTATE BIOPSY     10/22/2011 Volume:55.8 cc's, PSA:4.5, Free PSA:12%   ROBOT ASSISTED LAPAROSCOPIC RADICAL PROSTATECTOMY  12/15/2011   Procedure: ROBOTIC ASSISTED LAPAROSCOPIC RADICAL PROSTATECTOMY LEVEL 2;  Surgeon: Dutch Gray, MD;  Location: [facility];  Service: Urology;  Laterality: N/A;       ROTATOR CUFF REPAIR Right    SHOULDER ARTHROSCOPY WITH SUBACROMIAL DECOMPRESSION, ROTATOR CUFF REPAIR AND BICEP TENDON REPAIR Left 11/07/2021   Procedure: SHOULDER ARTHROSCOPY WITH DEBRIDEMENT, DECOMPRESSION,  ROTATOR CUFF REPAIR AND BICEPS TENODESIS.;  Surgeon: Corky Mull, MD;  Location: [facility];  Service: Orthopedics;  Laterality: Left;   TONSILLECTOMY     TOTAL THYROIDECTOMY     HPI:  Per H&P, pt " is a 77 y.o. male with medical history significant of early dementia.  However history is obtained from the patient, as well as from the sister at the bedside Coralyn Mark as well as from a report from the ER provider.  Per sister, when patient is not having medical issues his memory is still reasonably accurate and cannot be relied upon.  Patient describes that he has had progressive weakness in all 4 extremities due to arthritis and unspecified nerve disorder which has caused him to lose ambulation ability for the last 2 years.  Patient is therefore a resident of a nursing home.  Patient is obviously noted to be obese as well during this encounter.  Patient is wheelchair-bound and has not walked in several years and reports that he was feeling his usual self till about 2 weeks ago when he starts reporting a new onset of sensation of shortness of breath.  Sensation of shortness of breath is pretty much present all the time whether the patient is at rest or exerting himself.  He does not report any aggravating or relieving factors.  Describes it as a "panic attack ".  There is no report of chest pain fever coughing leg swelling.  Patient does report chronic cramping of the legs however.  Patient had been resting all day today and reports his last meal and water intake was actually last evening.  Patient reports waking up around afternoon and having his sensation of shortness of breath as before.  Patient subsequently describes the nursing staff at the residence calling EMS and patient being transported to the ER.  Patient reports no new acute symptoms this afternoon.     History is obtained from patient's sister as well who spoke with social worker Hope at the nursing home.  As per the sister, EMS was called  because patient was found to be lethargic and cyanotic this afternoon.  Further history is obtained from the ER provider as well whose documentation corroborates reports of sensation of shortness of breath and "dysarthria ".  Sister is currently at bedside and reports that the patient's speech is at his baseline.  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 Pt sitting up in bed w/ lights off upon SLP arrival. Pt appeared drowsy and cooperative t/o session. Pt's alertness waxed and waned t/o eval c/b intermittently closing eyes and snoring. Pt required frequent cues to attend to task d/t drowsiness. Pt left laying in bed w/ call button in reach and bed alarm set.  Pt on 2 L O2 via [place]; afebrile; WBC  WNL. Of note: Pt w/ hx of Dementia.  Administered trials of ice chips and puree. Pt required full assist for feeding and mod/max verbal/tactile cues to attend to task in setting of drowsy presentation. During oral phase, pt exhibited adequate bolus management, lingual sweep to clear anterior labial spillage (x1), good rotary mastication, and timely A-P transit. During pharyngeal phase, pt w/ significant, immediate cough x1 after ice chip (1/3 trials). Pt coughed for ~ 10 seconds. No other overt s/s of aspiration such as throat clearing nor wet vocal quality noted. Pt's drowsy presentation at the time of the eval likely to have impact on pt's ability to attend to po's and swallow safely.  Recommend continue NPO w/ exception of puree w/ critical meds (crushed). Pt's current presentation does not support a safe diet. Ensure pt is alert and attentive w/ all po's using verbal/tactile cues. Recommend following strict aspiration precautions w/ all po's. Recommend full assist w/ po's.   ST services will  monitor pt's status in next 1-2 days for potential diet upgrade and education. RN updated/agreed.  SLP Visit Diagnosis: Dysphagia, pharyngeal phase (R13.13)    Aspiration Risk  Moderate aspiration risk    Diet Recommendation   NPO w/ exception of critical meds crushed w/ puree. Please ensure pt is alert and attentive w/ po's!!  Medication Administration: Crushed with puree    Other  Recommendations Oral Care Recommendations: Oral care BID;Oral care before and after PO    Recommendations for follow up therapy are one component of a multi-disciplinary discharge planning process, led by the attending physician.  Recommendations may be updated based on patient status, additional functional criteria and insurance authorization.  Follow up Recommendations Follow physician's recommendations for discharge plan and follow up therapies      Assistance Recommended at Discharge  Full  Functional Status Assessment Patient has had a recent decline in their functional status and/or demonstrates limited ability to make significant improvements in function in a reasonable and predictable amount of time  Frequency and Duration min 2x/week  2 weeks       Prognosis Prognosis for improved oropharyngeal function: Fair Barriers to Reach Goals: Cognitive deficits;Severity of deficits      Swallow Study   General Date of Onset: 02/04/23 HPI: Per H&P, pt " is a 77 y.o. male with medical history significant of early dementia.  However history is obtained from the patient, as well as from the sister at the bedside Coralyn Mark as well as from a report from the ER provider.  Per sister, when patient is not having medical issues his memory is still reasonably accurate and cannot be relied upon.  Patient describes that he has had progressive weakness in all 4 extremities due to arthritis and unspecified nerve disorder which has caused him to lose ambulation ability for the last 2 years.  Patient is therefore a  resident of a nursing home.  Patient is obviously noted to be obese as well during this encounter.  Patient is wheelchair-bound and has not walked in several years and reports that he was feeling his usual self till about 2 weeks ago when he starts reporting a new onset of sensation of shortness of breath.  Sensation of shortness of breath is pretty much present all the time whether the patient is at rest or exerting himself.  He does not report any aggravating or relieving factors.  Describes it as a "panic attack ".  There is no report of chest pain fever coughing leg swelling.  Patient does report chronic cramping of the legs however.  Patient had been resting all day today and reports his last meal and water intake was actually last evening.  Patient reports waking up around afternoon and having his sensation of shortness of breath as before.  Patient subsequently describes the nursing staff at the residence calling EMS and patient being transported to the ER.  Patient reports no new acute symptoms this afternoon.     History is obtained from patient's sister as well who spoke with social worker Hope at the nursing home.  As per the sister, EMS was called because patient was found to be lethargic and cyanotic this afternoon.  Further history is obtained from the ER provider as well whose documentation corroborates reports of sensation of shortness of breath and "dysarthria ".  Sister is currently at bedside and reports that the patient's speech is at his baseline.  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 speech as sometimes he speaks in a slow drawnout manner especially when he is thinking." Type of Study: Bedside Swallow Evaluation Diet Prior to this Study: NPO Temperature Spikes Noted: No (WBC 8.8) Respiratory Status: Nasal cannula (2 L) History of Recent Intubation: No Behavior/Cognition: Lethargic/Drowsy;Distractible;Requires cueing;Alert;Pleasant  mood Oral Cavity Assessment: Other (comment) (laceration on tongue) Oral Care Completed by SLP: No Oral Cavity - Dentition: Adequate natural dentition Vision:  (N/A) Self-Feeding Abilities: Total assist Patient Positioning: Upright in bed Baseline Vocal Quality: Normal Volitional Cough:  (NT) Volitional Swallow: Able to elicit    Oral/Motor/Sensory Function Overall Oral Motor/Sensory Function: Within functional limits   Ice Chips Ice chips: Impaired Presentation: Spoon (x 3-4) Pharyngeal Phase Impairments: Cough - Immediate   Thin Liquid Thin Liquid: Not tested    Nectar Thick Nectar Thick Liquid: Not tested   Honey Thick Honey Thick Liquid: Not tested   Puree Puree: Within functional limits Presentation: Spoon (x 3 bites)   Solid     Solid: Not tested     Randall Hiss Graduate Clinician [place], Speech Pathology   Randall Hiss 02/05/2023,12:59 PM  Cosigned by: Cherrie Gauze, M.S., [facility] [phone] ([place])

## 2023-02-05 NOTE — ED Notes (Signed)
Patient having hard time staying awake, this nurse Enis Gash, MD concerning medications (isosorbide, flexeril, lyrica) in r/t current vital signs of HR 49, BP 107/75, and the patient sleeping. Awaiting return call. Medications held until provider gives okay.

## 2023-02-05 NOTE — ED Notes (Signed)
Patient Isosorbide, lyrica, flexeril held per Karleen Hampshire, MD in relation to drowsiness and vitals.

## 2023-02-05 NOTE — ED Notes (Signed)
Unable to obtain VBG due to chart lock fail. Called multiple sources to fix. Specimen sent was refused processing due to error

## 2023-02-05 NOTE — ED Notes (Signed)
Request made for transport to the floor ?

## 2023-02-05 NOTE — Progress Notes (Signed)
*  PRELIMINARY RESULTS* Echocardiogram 2D Echocardiogram has been performed.  Sherrie Sport 02/05/2023, 8:47 AM

## 2023-02-05 NOTE — Progress Notes (Signed)
Pt refused CPAP. Pt states he does wear CPAP at home but does not want to wear one of ours. Pt aware that if he changes his mind a CPAP will be made available for him.

## 2023-02-05 NOTE — ED Notes (Signed)
Pt has an error reading Chart lock fail when effort made to print a label for VBG. This RN called Lab, and Respiratory to have a VBG processed for this pt.  After no resolve this RN did notify Charge of situation.  Informed she will advise this RN.

## 2023-02-05 NOTE — Progress Notes (Signed)
[facility]                                                                               [name] [name], is a 77 y.o. male, DOB - 10-28-1946, [id number] Admit date - 02/04/2023    Outpatient Primary MD for the patient is Simpson-Tarokh, Leann, DO  LOS - 0  days    Brief summary    [name] [name] is a 77 y.o. male with medical history significant of early dementia,  was brought in for lethargy, and confusion.   .  Patient is wheelchair-bound and has not walked in several years and reports that he was feeling his usual self till about 2 weeks ago when he starts reporting a new onset of sensation of shortness of breath.  He is currently on 2 lit of [place] oxygen to keep sats greater than 90%. CT angio of the chest is negative for PE. CT head is negative for acute stroke. Venous duplex of the lower extremities is negative for acute DVT. VBG is unremarkable. Echocardiogram is unremarkable.    Assessment & Plan    Assessment and Plan: Lethargy/ acute encephalopathy of unclear etiology.  CT head is negative for acute stroke. VBG is unremarkable.  TSH, vit B12 and ammonia levels are pending.  ? Early dementia- progressing vs polypharmacy.  Holding off on risperidone and flexeril.    Dehydration Gentle hydration.    Liver nodule Outpatient eval  Esophageal thickening Patient has prior history of esophageal strictures documented in the chart, this will need further evaluation as an outpatient.  Bradycardia Sinus bradycardia.   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  We are awaiting speech/swallow evaluation in the ER to screen out aspiration and then look forward to starting patient's diet.  CT head wnl.  MRI Brain is pending.    Hypothyroidism:  Tsh pending. Resume synthroid 150 mcg daily.   Hyperlipidemia:  Continue with Lipitor 40 mg daily.    COPD:  No wheezing heard.  Continue with dulera and  albuterol prn.  Currently on 2 lit of [place] oxygen to keep sats greater than 90%.     Type 2 DM;  CBG (last 3)  Recent Labs    02/04/23 1338 02/05/23 0848  GLUCAP 165* 188*  182*   Get A1c.  Continue with SSI.    Hypertension;  Well controlled.  Currently on isosorbide dinitrate,    Chronic diastolic heart failure:  Euvolemic. Resume lasix 40 mg BID.  Echocardiogram unremarkable.    OSA   On CPAP at night.   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   Body mass index is 32.53 kg/m. Obesity.     Estimated body mass index is 32.53 kg/m as calculated from the following:   Height as of 12/02/22: 6' 2.02" (1.88 m).   Weight as of this encounter: 115 kg.  Code Status: full code.  DVT Prophylaxis:  SCDs Start: 02/04/23 2018   Level of Care: Level of care: Telemetry Medical Family Communication: None at bedside.   Disposition Plan:     Remains inpatient appropriate:  pending work up for lethargy and  sob.   Procedures:  MRI brain without contrast.   Consultants:   Neurology.   Antimicrobials:   Anti-infectives (From admission, onward)    None        Medications  Scheduled Meds:  atorvastatin  40 mg Oral Daily   clopidogrel  75 mg Oral Daily   donepezil  10 mg Oral QHS   enoxaparin (LOVENOX) injection  0.5 mg/kg Subcutaneous Q24H   FLUoxetine  40 mg Oral Daily   furosemide  40 mg Oral BID   insulin aspart  0-15 Units Subcutaneous TID WC   insulin aspart  0-5 Units Subcutaneous QHS   isosorbide dinitrate  10 mg Oral TID   levothyroxine  150 mcg Oral Daily   linaclotide  290 mcg Oral Daily   [START ON 02/07/2023] linagliptin  5 mg Oral Daily   magnesium oxide  400 mg Oral BID   midodrine  5 mg Oral Daily   mometasone-formoterol  2 puff Inhalation BID   omega-3 acid ethyl esters  1 g Oral Daily   pantoprazole  40 mg Oral Daily   polyethylene glycol  17 g Oral Daily   polyvinyl alcohol  1 drop Both Eyes QID   potassium chloride SA  20 mEq Oral TID WC    pregabalin  300 mg Oral BID   senna-docusate  1 tablet Oral BID   simethicone  80 mg Oral BID   sodium chloride flush  3 mL Intravenous Once   sodium chloride flush  3 mL Intravenous Q12H   Continuous Infusions: PRN Meds:.acetaminophen **OR** acetaminophen, albuterol, bisacodyl, diphenhydrAMINE, menthol-cetylpyridinium, traZODone    Subjective:   [name] [name] was seen and examined today.  Lethargic. Sleepy.   Objective:   Vitals:   02/05/23 1100 02/05/23 1200 02/05/23 1300 02/05/23 1454  BP: 107/75 114/60 (!) 106/55 119/69  Pulse: (!) 49  (!) 55 (!) 51  Resp:   18 (!) 24  Temp:   98.2 F (36.8 C) 98.4 F (36.9 C)  TempSrc:   Oral   SpO2: 100%  94% 99%  Weight:        Intake/Output Summary (Last 24 hours) at 02/05/2023 1455 Last data filed at 02/04/2023 2336 Gross per 24 hour  Intake 1000 ml  Output --  Net 1000 ml   Filed Weights   02/04/23 2027  Weight: 115 kg     Exam  General exam: elderly gentleman, not in distress.  Respiratory system: diminished at bases, on 2 lit of [place] oxygen.  Cardiovascular system: S1 & S2 heard, RRR. No JVD,  Gastrointestinal system: Abdomen is nondistended, soft and nontender.  Central nervous system: lethargic, opens eyes to take meds and mumbles, goes back to sleep.  Extremities: no pedal edema.  Skin: No rashes seen.  Psychiatry: unable to assess.    Data Reviewed:  I have personally reviewed following labs and imaging studies   CBC Lab Results  Component Value Date   WBC 8.8 02/05/2023   RBC 4.88 02/05/2023   HGB 13.8 02/05/2023   HCT 42.4 02/05/2023   MCV 86.9 02/05/2023   MCH 28.3 02/05/2023   PLT 144 (L) 02/05/2023   MCHC 32.5 02/05/2023   RDW 14.8 02/05/2023   LYMPHSABS 1.0 02/04/2023   MONOABS 0.6 02/04/2023   EOSABS 0.1 02/04/2023   BASOSABS 0.0 23/55/7322     Last metabolic panel Lab Results  Component Value Date   NA 138 02/05/2023   K 3.8 02/05/2023   CL 103 02/05/2023   CO2  26 02/05/2023   BUN  22 02/05/2023   CREATININE 1.04 02/05/2023   GLUCOSE 168 (H) 02/05/2023   GFRNONAA >60 02/05/2023   GFRAA >60 02/20/2019   CALCIUM 9.2 02/05/2023   PHOS 4.1 11/16/2020   PROT 7.1 02/04/2023   ALBUMIN 3.5 02/04/2023   BILITOT 1.3 (H) 02/04/2023   ALKPHOS 104 02/04/2023   AST 20 02/04/2023   ALT 25 02/04/2023   ANIONGAP 9 02/05/2023    CBG (last 3)  Recent Labs    02/04/23 1338 02/05/23 0848  GLUCAP 165* 188*  182*      Coagulation Profile: Recent Labs  Lab 02/04/23 1425  INR 1.1     Radiology Studies: ECHOCARDIOGRAM COMPLETE  Result Date: 02/05/2023    ECHOCARDIOGRAM REPORT   Patient Name:   [name] [name] [name] [name] Date of Exam: 02/05/2023 Medical Rec #:  [id number]     Height:       74.0 in Accession #:    [id number]    Weight:       253.5 lb Date of Birth:  16-Jun-1946      BSA:          2.404 m Patient Age:    35 years      BP:           159/93 mmHg Patient Gender: M             HR:           57 bpm. Exam Location:  [facility] Procedure: 2D Echo, Cardiac Doppler and Color Doppler Indications:     Cardiomegaly I51.7  History:         Patient has prior history of Echocardiogram examinations, most                  recent 06/29/2022. COPD; Risk Factors:Hypertension and Diabetes.  Sonographer:     Sherrie Sport Referring Phys:  [id number] [facility] GOEL Diagnosing Phys: Ida Rogue MD  Sonographer Comments: Technically difficult study due to poor echo windows, no apical window and no subcostal window. Image acquisition challenging due to COPD and Image acquisition challenging due to patient body habitus. IMPRESSIONS  1. Left ventricular ejection fraction, by estimation, is 60 to 65%. The left ventricle has normal function. The left ventricle has no regional wall motion abnormalities. There is mild left ventricular hypertrophy. Left ventricular diastolic parameters are indeterminate.  2. Right ventricular systolic function is normal. The right ventricular size is normal.  3. The mitral valve is normal in  structure. No evidence of mitral valve regurgitation. No evidence of mitral stenosis.  4. The aortic valve was not well visualized. Aortic valve regurgitation is not visualized. No aortic stenosis is present.  5. The inferior vena cava is normal in size with greater than 50% respiratory variability, suggesting right atrial pressure of 3 mmHg. FINDINGS  Left Ventricle: Left ventricular ejection fraction, by estimation, is 60 to 65%. The left ventricle has normal function. The left ventricle has no regional wall motion abnormalities. The left ventricular internal cavity size was normal in size. There is  mild left ventricular hypertrophy. Left ventricular diastolic parameters are indeterminate. Right Ventricle: The right ventricular size is normal. No increase in right ventricular wall thickness. Right ventricular systolic function is normal. Left Atrium: Left atrial size was normal in size. Right Atrium: Right atrial size was normal in size. Pericardium: There is no evidence of pericardial effusion. Mitral Valve: The mitral valve is normal in structure. No evidence of mitral valve regurgitation. No  evidence of mitral valve stenosis. Tricuspid Valve: The tricuspid valve is normal in structure. Tricuspid valve regurgitation is not demonstrated. No evidence of tricuspid stenosis. Aortic Valve: The aortic valve was not well visualized. Aortic valve regurgitation is not visualized. No aortic stenosis is present. Pulmonic Valve: The pulmonic valve was normal in structure. Pulmonic valve regurgitation is not visualized. No evidence of pulmonic stenosis. Aorta: The aortic root is normal in size and structure. Venous: The inferior vena cava is normal in size with greater than 50% respiratory variability, suggesting right atrial pressure of 3 mmHg. IAS/Shunts: No atrial level shunt detected by color flow Doppler.  LEFT VENTRICLE PLAX 2D LVIDd:         5.60 cm LVIDs:         3.20 cm LV PW:         1.30 cm LV IVS:        0.90 cm  LVOT diam:     2.20 cm LVOT Area:     3.80 cm  LEFT ATRIUM         Index LA diam:    1.60 cm 0.67 cm/m   AORTA Ao Root diam: 3.10 cm  SHUNTS Systemic Diam: 2.20 cm Ida Rogue MD Electronically signed by Ida Rogue MD Signature Date/Time: 02/05/2023/12:59:30 PM    Final    US Venous Img Lower Bilateral (DVT)  Result Date: 02/04/2023 CLINICAL DATA:  Bilateral leg and foot cramping. EXAM: BILATERAL LOWER EXTREMITY VENOUS DOPPLER ULTRASOUND TECHNIQUE: Gray-scale sonography with graded compression, as well as color Doppler and duplex ultrasound were performed to evaluate the lower extremity deep venous systems from the level of the common femoral vein and including the common femoral, femoral, profunda femoral, popliteal and calf veins including the posterior tibial, peroneal and gastrocnemius veins when visible. The superficial great saphenous vein was also interrogated. Spectral Doppler was utilized to evaluate flow at rest and with distal augmentation maneuvers in the common femoral, femoral and popliteal veins. COMPARISON:  None Available. FINDINGS: RIGHT LOWER EXTREMITY Common Femoral Vein: No evidence of thrombus. Normal compressibility, respiratory phasicity and response to augmentation. Saphenofemoral Junction: No evidence of thrombus. Normal compressibility and flow on color Doppler imaging. Profunda Femoral Vein: No evidence of thrombus. Normal compressibility and flow on color Doppler imaging. Femoral Vein: No evidence of thrombus. Normal compressibility, respiratory phasicity and response to augmentation. Popliteal Vein: No evidence of thrombus. Normal compressibility, respiratory phasicity and response to augmentation. Calf Veins: The RIGHT posterior tibial vein and RIGHT peroneal vein are not visualized. Superficial Great Saphenous Vein: No evidence of thrombus. Normal compressibility. Venous Reflux:  None. Other Findings:  None. LEFT LOWER EXTREMITY Common Femoral Vein: No evidence of  thrombus. Normal compressibility, respiratory phasicity and response to augmentation. Saphenofemoral Junction: No evidence of thrombus. Normal compressibility and flow on color Doppler imaging. Profunda Femoral Vein: No evidence of thrombus. Normal compressibility and flow on color Doppler imaging. Femoral Vein: No evidence of thrombus. Normal compressibility, respiratory phasicity and response to augmentation. Popliteal Vein: No evidence of thrombus. Normal compressibility, respiratory phasicity and response to augmentation. Calf Veins: The LEFT posterior tibial vein and LEFT peroneal vein are not visualized. Superficial Great Saphenous Vein: No evidence of thrombus. Normal compressibility. Venous Reflux:  None. Other Findings:  None. IMPRESSION: Limited visualization of the BILATERAL posterior tibial veins and BILATERAL peroneal veins, without evidence of deep venous thrombosis in either lower extremity. Electronically Signed   By: Virgina Norfolk M.D.   On: 02/04/2023 22:46   CT Angio Chest  PE W and/or Wo Contrast  Result Date: 02/04/2023 CLINICAL DATA:  PE EXAM: CT ANGIOGRAPHY CHEST CT ABDOMEN AND PELVIS WITH CONTRAST TECHNIQUE: Multidetector CT imaging of the chest was performed using the standard protocol during bolus administration of intravenous contrast. Multiplanar CT image reconstructions and MIPs were obtained to evaluate the vascular anatomy. Multidetector CT imaging of the abdomen and pelvis was performed using the standard protocol during bolus administration of intravenous contrast. RADIATION DOSE REDUCTION: This exam was performed according to the departmental dose-optimization program which includes automated exposure control, adjustment of the mA and/or kV according to patient size and/or use of iterative reconstruction technique. CONTRAST:  100 mL OMNIPAQUE IOHEXOL 350 MG/ML SOLN COMPARISON:  CT chest 04/05/2019, CT abdomen and pelvis 11/22/2022 FINDINGS: CTA CHEST FINDINGS Cardiovascular:  Cardiomegaly. No pericardial effusion. Dense atheromatous calcifications of the aorta and its branches. No filling defects are identified in pulmonary arteries to indicate PE. No aortic aneurysm or dissection identified. Mediastinum/Nodes: Diffuse esophageal wall thickening identified. Endoscopic correlation recommended non emergently to assess for possible esophageal mucosal abnormalities. No suspicious adenopathy. Thyroid is not identified. Airways are patent. Lungs/Pleura: Linear bibasilar subsegmental atelectasis or scarring. No pneumonia or pulmonary edema. No pneumothorax or pleural effusion. Musculoskeletal: Thoracolumbosacral degenerative disc disease. No chest wall abnormalities. Osseous structures are osteopenic. Review of the MIP images confirms the above findings. CT ABDOMEN and PELVIS FINDINGS Hepatobiliary: Subcentimeter liver in the medial left lobe is too small to characterize. No biliary ductal dilatation. No enhancing abnormalities. Unremarkable gallbladder. Pancreas: Unremarkable. No pancreatic ductal dilatation or surrounding inflammatory changes. Spleen: Normal in size without focal abnormality. Adrenals/Urinary Tract: 1.1 cm left adrenal nodule consistent with an adenoma is stable finding. Bilateral renal cysts identified, the largest 3 cm midpole right. No hydronephrosis or nephrolithiasis. Unremarkable urinary bladder. Stomach/Bowel: No bowel dilatation to suggest obstruction. Diverticulosis of the descending and sigmoid. No appendicitis. Appendix is not identified. No abscesses. Vascular/Lymphatic: Aortic atherosclerosis. No enlarged abdominal or pelvic lymph nodes. Reproductive: Prostate is diminutive. Other: No abdominal wall hernia or abnormality. No abdominopelvic ascites. Musculoskeletal: Thoracolumbosacral degenerative changes. Review of the MIP images confirms the above findings. IMPRESSION: 1. No PE. 2. Cardiomegaly. 3. Abnormal esophagus with diffuse wall thickening. This can be  evaluated endoscopically. 4. Numerous renal cysts. 5. Subcentimeter hypodensity liver, too small to characterize. 6. Diverticulosis. Electronically Signed   By: Sammie Bench M.D.   On: 02/04/2023 18:15   CT ABDOMEN PELVIS W CONTRAST  Result Date: 02/04/2023 CLINICAL DATA:  PE EXAM: CT ANGIOGRAPHY CHEST CT ABDOMEN AND PELVIS WITH CONTRAST TECHNIQUE: Multidetector CT imaging of the chest was performed using the standard protocol during bolus administration of intravenous contrast. Multiplanar CT image reconstructions and MIPs were obtained to evaluate the vascular anatomy. Multidetector CT imaging of the abdomen and pelvis was performed using the standard protocol during bolus administration of intravenous contrast. RADIATION DOSE REDUCTION: This exam was performed according to the departmental dose-optimization program which includes automated exposure control, adjustment of the mA and/or kV according to patient size and/or use of iterative reconstruction technique. CONTRAST:  100 mL OMNIPAQUE IOHEXOL 350 MG/ML SOLN COMPARISON:  CT chest 04/05/2019, CT abdomen and pelvis 11/22/2022 FINDINGS: CTA CHEST FINDINGS Cardiovascular: Cardiomegaly. No pericardial effusion. Dense atheromatous calcifications of the aorta and its branches. No filling defects are identified in pulmonary arteries to indicate PE. No aortic aneurysm or dissection identified. Mediastinum/Nodes: Diffuse esophageal wall thickening identified. Endoscopic correlation recommended non emergently to assess for possible esophageal mucosal abnormalities. No suspicious adenopathy. Thyroid is not  identified. Airways are patent. Lungs/Pleura: Linear bibasilar subsegmental atelectasis or scarring. No pneumonia or pulmonary edema. No pneumothorax or pleural effusion. Musculoskeletal: Thoracolumbosacral degenerative disc disease. No chest wall abnormalities. Osseous structures are osteopenic. Review of the MIP images confirms the above findings. CT ABDOMEN  and PELVIS FINDINGS Hepatobiliary: Subcentimeter liver in the medial left lobe is too small to characterize. No biliary ductal dilatation. No enhancing abnormalities. Unremarkable gallbladder. Pancreas: Unremarkable. No pancreatic ductal dilatation or surrounding inflammatory changes. Spleen: Normal in size without focal abnormality. Adrenals/Urinary Tract: 1.1 cm left adrenal nodule consistent with an adenoma is stable finding. Bilateral renal cysts identified, the largest 3 cm midpole right. No hydronephrosis or nephrolithiasis. Unremarkable urinary bladder. Stomach/Bowel: No bowel dilatation to suggest obstruction. Diverticulosis of the descending and sigmoid. No appendicitis. Appendix is not identified. No abscesses. Vascular/Lymphatic: Aortic atherosclerosis. No enlarged abdominal or pelvic lymph nodes. Reproductive: Prostate is diminutive. Other: No abdominal wall hernia or abnormality. No abdominopelvic ascites. Musculoskeletal: Thoracolumbosacral degenerative changes. Review of the MIP images confirms the above findings. IMPRESSION: 1. No PE. 2. Cardiomegaly. 3. Abnormal esophagus with diffuse wall thickening. This can be evaluated endoscopically. 4. Numerous renal cysts. 5. Subcentimeter hypodensity liver, too small to characterize. 6. Diverticulosis. Electronically Signed   By: Sammie Bench M.D.   On: 02/04/2023 18:15   DG Chest Portable 1 View  Result Date: 02/04/2023 CLINICAL DATA:  Hypoxia EXAM: PORTABLE CHEST 1 VIEW COMPARISON:  07/05/2022 FINDINGS: Mild cardiomegaly. Aortic atherosclerosis. The lungs are clear. No edema, collapse or effusion. IMPRESSION: No active disease. Mild cardiomegaly. Aortic atherosclerosis. Electronically Signed   By: Nelson Chimes M.D.   On: 02/04/2023 14:27   CT HEAD CODE STROKE WO CONTRAST  Result Date: 02/04/2023 CLINICAL DATA:  Code stroke.  Slurred speech, aphasia EXAM: CT HEAD WITHOUT CONTRAST TECHNIQUE: Contiguous axial images were obtained from the base of  the skull through the vertex without intravenous contrast. RADIATION DOSE REDUCTION: This exam was performed according to the departmental dose-optimization program which includes automated exposure control, adjustment of the mA and/or kV according to patient size and/or use of iterative reconstruction technique. COMPARISON:  MR head 07/05/2022 FINDINGS: Brain: There is no acute intracranial hemorrhage, extra-axial fluid collection, or acute infarct. Parenchymal volume is stable. The ventricles are stable in size. Gray-Bernick differentiation is preserved. The pituitary and suprasellar region are normal. There is no mass lesion. There is no mass effect or midline shift. Vascular: No hyperdense vessel or unexpected calcification. Skull: Normal. Negative for fracture or focal lesion. Sinuses/Orbits: There is mild mucosal thickening in the paranasal sinuses. Bilateral lens implants are in place. The globes and orbits are otherwise unremarkable. Other: None. ASPECTS [facility] Stroke Program Early CT Score) - Ganglionic level infarction (caudate, lentiform nuclei, internal capsule, insula, M1-M3 cortex): 7 - Supraganglionic infarction (M4-M6 cortex): 3 Total score (0-10 with 10 being normal): 10 IMPRESSION: No acute intracranial pathology. Findings communicated to Dr Cheral Marker via Shea Evans at 1:55 pm. Electronically Signed   By: Valetta Mole M.D.   On: 02/04/2023 13:56       Hosie Poisson M.D. [facility] 02/05/2023, 2:55 PM  Available via Epic secure chat 7am-7pm After 7 pm, please refer to night coverage provider listed on amion.

## 2023-02-06 ENCOUNTER — Inpatient Hospital Stay: Payer: Medicare Other

## 2023-02-06 DIAGNOSIS — G9340 Encephalopathy, unspecified: Secondary | ICD-10-CM | POA: Diagnosis not present

## 2023-02-06 DIAGNOSIS — E860 Dehydration: Secondary | ICD-10-CM | POA: Diagnosis not present

## 2023-02-06 DIAGNOSIS — R5383 Other fatigue: Secondary | ICD-10-CM | POA: Diagnosis not present

## 2023-02-06 LAB — COMPREHENSIVE METABOLIC PANEL
ALT: 24 U/L (ref 0–44)
AST: 19 U/L (ref 15–41)
Albumin: 3.5 g/dL (ref 3.5–5.0)
Alkaline Phosphatase: 107 U/L (ref 38–126)
Anion gap: 14 (ref 5–15)
BUN: 21 mg/dL (ref 8–23)
CO2: 27 mmol/L (ref 22–32)
Calcium: 9.9 mg/dL (ref 8.9–10.3)
Chloride: 105 mmol/L (ref 98–111)
Creatinine, Ser: 1.07 mg/dL (ref 0.61–1.24)
GFR, Estimated: >60 mL/min (ref >60)
Glucose, Bld: 156 mg/dL — ABNORMAL HIGH (ref 70–99)
Potassium: 4.1 mmol/L (ref 3.5–5.1)
Sodium: 141 mmol/L (ref 135–145)
Total Bilirubin: 1.7 mg/dL — ABNORMAL HIGH (ref 0.3–1.2)
Total Protein: 6.9 g/dL (ref 6.5–8.1)

## 2023-02-06 LAB — CBC WITH DIFFERENTIAL/PLATELET
Abs Immature Granulocytes: 0.03 10*3/uL (ref 0.00–0.07)
Basophils Absolute: 0.1 10*3/uL (ref 0.0–0.1)
Basophils Relative: 1 %
Eosinophils Absolute: 0.4 10*3/uL (ref 0.0–0.5)
Eosinophils Relative: 4 %
HCT: 41.3 % (ref 39.0–52.0)
Hemoglobin: 13.3 g/dL (ref 13.0–17.0)
Immature Granulocytes: 0 %
Lymphocytes Relative: 9 %
Lymphs Abs: 0.8 10*3/uL (ref 0.7–4.0)
MCH: 28.1 pg (ref 26.0–34.0)
MCHC: 32.2 g/dL (ref 30.0–36.0)
MCV: 87.1 fL (ref 80.0–100.0)
Monocytes Absolute: 0.7 10*3/uL (ref 0.1–1.0)
Monocytes Relative: 9 %
Neutro Abs: 6.3 10*3/uL (ref 1.7–7.7)
Neutrophils Relative %: 77 %
Platelets: 147 10*3/uL — ABNORMAL LOW (ref 150–400)
RBC: 4.74 MIL/uL (ref 4.22–5.81)
RDW: 14.8 % (ref 11.5–15.5)
WBC: 8.3 10*3/uL (ref 4.0–10.5)
nRBC: 0 % (ref 0.0–0.2)

## 2023-02-06 LAB — VITAMIN B12: Vitamin B-12: 202 pg/mL (ref 180–914)

## 2023-02-06 LAB — GLUCOSE, CAPILLARY
Glucose-Capillary: 168 mg/dL — ABNORMAL HIGH (ref 70–99)
Glucose-Capillary: 152 mg/dL — ABNORMAL HIGH (ref 70–99)
Glucose-Capillary: 220 mg/dL — ABNORMAL HIGH (ref 70–99)
Glucose-Capillary: 281 mg/dL — ABNORMAL HIGH (ref 70–99)
Glucose-Capillary: 281 mg/dL — ABNORMAL HIGH (ref 70–99)

## 2023-02-06 LAB — T4, FREE: Free T4: 0.95 ng/dL (ref 0.61–1.12)

## 2023-02-06 MED ORDER — RISPERIDONE 0.5 MG PO TABS
0.5000 mg | ORAL_TABLET | Freq: Every day | ORAL | Status: DC
  Administered 2023-02-06 – 2023-02-09 (×4): 0.5 mg via ORAL
  Filled 2023-02-06 (×4): qty 1

## 2023-02-06 MED ORDER — TRAMADOL HCL 50 MG PO TABS
50.0000 mg | ORAL_TABLET | Freq: Two times a day (BID) | ORAL | Status: DC | PRN
  Administered 2023-02-06 – 2023-02-08 (×4): 50 mg via ORAL
  Filled 2023-02-06 (×4): qty 1

## 2023-02-06 NOTE — Evaluation (Signed)
Physical Therapy Evaluation Patient Details Name: [name] [name] MRN: [id number] DOB: [birth year] Today's Date: 02/06/2023  History of Present Illness  [name] [name] is a 77 y.o. male with medical history significant of early dementia,  was brought in for lethargy, and confusion.   .  Patient is wheelchair-bound and has not walked in several years and reports that he was feeling his usual self till about 2 weeks ago when he starts reporting a new onset of sensation of shortness of breath.  He is currently on 2 lit of [place] oxygen to keep sats greater than 90%. CT angio of the chest is negative for PE. CT head is negative for acute stroke. Venous duplex of the lower extremities is negative for acute DVT. VBG is unremarkable. Echocardiogram is unremarkable.   Clinical Impression  Pt admitted with above diagnosis. Pt received upright in bed agreeable to PT services. Reports at baseline up until the last two weeks he was beginning to walk short distances with the RW at [facility] and SPT with RW but now has been reliant on 2 person assist with standing and transfers.   To date pt relies on bed features and modA to sit EOB. Overall limited UE/LE mobility towards EOB, slow moving, endorsing scrotal and onset of worsening LBP limiting sitting tolerance to < 2 minutes so unable to attempt standing or lateral scoot transfer as pt endorses need to return to supine maxA at LE's.  With bed in trendelenburg pt is able to boost himself with BUE's on bed rail at [facility]. Pt positioned in upright sitting in bed in prep for meal tray. All needs in reach. Pt far below typical baseline so heavily recommend additional f/u PT at discharge. Pt currently with functional limitations due to the deficits listed below (see PT Problem List). Pt will benefit from skilled PT to increase their independence and safety with mobility to allow discharge to the venue listed below.      Recommendations for follow up therapy are one component of  a multi-disciplinary discharge planning process, led by the attending physician.  Recommendations may be updated based on patient status, additional functional criteria and insurance authorization.  Follow Up Recommendations Skilled nursing-short term rehab (<3 hours/day) Can patient physically be transported by private vehicle: No    Assistance Recommended at Discharge Intermittent Supervision/Assistance  Patient can return home with the following  Two people to help with walking and/or transfers;Two people to help with bathing/dressing/bathroom;Help with stairs or ramp for entrance    Equipment Recommendations None recommended by PT  Recommendations for Other Services       Functional Status Assessment Patient has had a recent decline in their functional status and demonstrates the ability to make significant improvements in function in a reasonable and predictable amount of time.     Precautions / Restrictions Precautions Precautions: Fall Restrictions Weight Bearing Restrictions: No      Mobility  Bed Mobility Overal bed mobility: Needs Assistance Bed Mobility: Supine to Sit, Sit to Supine     Supine to sit: Mod assist, HOB elevated Sit to supine: Max assist     Patient Response: Cooperative  Transfers                   General transfer comment: unable to attempt due to LBP    Ambulation/Gait                  Stairs  Wheelchair Mobility    Modified Rankin (Stroke Patients Only)       Balance Overall balance assessment: Needs assistance Sitting-balance support: Bilateral upper extremity supported, Feet supported Sitting balance-Leahy Scale: Fair                                       Pertinent Vitals/Pain Pain Assessment Pain Assessment: Faces Faces Pain Scale: Hurts even more Pain Location: lower back and R elbow Pain Descriptors / Indicators: Grimacing, Aching Pain Intervention(s): Limited activity  within patient's tolerance, Monitored during session, Repositioned    Home Living Family/patient expects to be discharged to:: Skilled nursing facility                   Additional Comments: Remains living at [facility] and receiving rehab.    Prior Function Prior Level of Function : Needs assist       Physical Assist : Mobility (physical);ADLs (physical) Mobility (physical): Bed mobility;Transfers;Gait   Mobility Comments: Normally ambulating short distances up until 2 weeks ago. Typically SPT to RW but has been reliant on 2 person assist now.       Hand Dominance        Extremity/Trunk Assessment   Upper Extremity Assessment Upper Extremity Assessment: Defer to OT evaluation    Lower Extremity Assessment Lower Extremity Assessment: Generalized weakness       Communication   Communication: No difficulties  Cognition Arousal/Alertness: Awake/alert Behavior During Therapy: WFL for tasks assessed/performed Overall Cognitive Status: Within Functional Limits for tasks assessed                                          General Comments      Exercises Other Exercises Other Exercises: Role of PT in acute setting, d/c recs   Assessment/Plan    PT Assessment Patient needs continued PT services  PT Problem List Decreased strength;Decreased activity tolerance;Decreased balance       PT Treatment Interventions DME instruction;Neuromuscular re-education;Gait training;Balance training;Functional mobility training;Patient/family education;Therapeutic activities;Therapeutic exercise    PT Goals (Current goals can be found in the Care Plan section)  Acute Rehab PT Goals Patient Stated Goal: improve mobility at [facility] PT Goal Formulation: With patient Time For Goal Achievement: 02/20/23 Potential to Achieve Goals: Fair    Frequency Min 2X/week     Co-evaluation               AM-PAC PT "6 Clicks" Mobility  Outcome  Measure Help needed turning from your back to your side while in a flat bed without using bedrails?: A Lot Help needed moving from lying on your back to sitting on the side of a flat bed without using bedrails?: A Lot Help needed moving to and from a bed to a chair (including a wheelchair)?: Total Help needed standing up from a chair using your arms (e.g., wheelchair or bedside chair)?: A Lot Help needed to walk in hospital room?: Total Help needed climbing 3-5 steps with a railing? : Total 6 Click Score: 9    End of Session   Activity Tolerance: Patient limited by pain Patient left: in bed;with call bell/phone within reach;with bed alarm set Nurse Communication: Mobility status PT Visit Diagnosis: Other abnormalities of gait and mobility (R26.89);Muscle weakness (generalized) (M62.81);History of falling (Z91.81)  Time: [id number] PT Time Calculation (min) (ACUTE ONLY): 17 min   Charges:   PT Evaluation $PT Eval Moderate Complexity: Arab M. Fairly IV, PT, DPT Physical Therapist- [facility]  02/06/2023, 10:28 AM

## 2023-02-06 NOTE — NC FL2 (Signed)
[place] LEVEL OF CARE FORM     IDENTIFICATION  Patient Name: [name] [name] Birthdate: 1946/01/16 Sex: male Admission Date (Current Location): 02/04/2023  [place] and [place] Number:  Engineering geologist and Address:  [facility], [address], [place], [place] [address]      Provider Number: [id number]  Attending Physician Name and Address:  Hosie Poisson, MD  Relative Name and Phone Number:       Current Level of Care: Hospital Recommended Level of Care: [place] Prior Approval Number:    Date Approved/Denied:   PASRR Number: [id number]  Discharge Plan: SNF    Current Diagnoses: Patient Active Problem List   Diagnosis Date Noted   Acute encephalopathy 02/05/2023   Lethargy 02/04/2023   Cyanosis 02/04/2023   Speech abnormality 02/04/2023   Bradycardia 02/04/2023   Esophageal thickening 02/04/2023   Liver nodule 02/04/2023   Cerebral infarct ([place]) 02/04/2023   Dehydration 02/04/2023   TIA (transient ischemic attack) 07/05/2022   Abnormal MRI of head 07/05/2022   Uncontrolled diabetes mellitus with hyperglycemia, without long-term current use of insulin ([place])    Morbid obesity ([place])    Cervical myelopathy with history of cervical discectomy and fusion ([place])    Esophageal dysphagia 11/18/2020   Elevated BUN 11/18/2020   AKI (acute kidney injury) ([place]) 11/18/2020   HCAP (healthcare-associated pneumonia)    Mucus plugging of bronchi    Left cervical radiculopathy 10/26/2020   Acute cervical radiculopathy 10/26/2020   Pressure injury of skin 10/26/2020   Problems with swallowing and mastication    Stricture and stenosis of esophagus    Hx of colonic polyps    Polyp of ascending colon    Bilateral carpal tunnel syndrome 02/13/2020   Neurocardiogenic syncope 03/02/2019   Lymphedema 02/04/2019   Tobacco use 02/03/2019   CHF (congestive heart failure) ([place]) 12/23/2018   Chronic diastolic CHF (congestive  heart failure) ([place]) 11/27/2018   Cellulitis 11/27/2018   Hypothyroidism 11/27/2018   OSA (obstructive sleep apnea) 11/27/2018   Hypertension 11/27/2018   Mixed hyperlipidemia 11/27/2018   Protein-calorie malnutrition ([place]) 03/02/2018   Neuropathy 03/02/2018   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   Difficulty in voiding 03/01/2018   Vitamin D deficiency 03/01/2018   Vitamin B12 deficiency 03/01/2018   Insomnia 03/01/2018   COPD (chronic obstructive pulmonary disease) ([place]) 02/11/2018   Pain due to onychomycosis of nail 02/11/2018   Onychomycosis 02/11/2018   Traumatic ecchymosis of foot, right, initial encounter 02/11/2018   Xerostomia 02/11/2018   Frequent falls 02/11/2018   Acute respiratory failure ([place]) 02/07/2018   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   History of thyroid cancer 05/05/2014    Orientation RESPIRATION BLADDER Height & Weight     Self, Time, Place  Normal Incontinent, External catheter Weight: 253 lb 8.5 oz (115 kg) (as noted 01/2023 per RN per patient) Height:     BEHAVIORAL SYMPTOMS/MOOD NEUROLOGICAL BOWEL NUTRITION STATUS   (None)  (None) Continent Diet (Regular. Please CUT MEATS and add gravy; chop spaghetti too.  NO STRAWS!!!! Cream soups.)  AMBULATORY STATUS COMMUNICATION OF NEEDS Skin   Extensive Assist Verbally Skin abrasions, Bruising, Other (Comment), PU Stage and Appropriate Care (Erythema/redness, scratch marks.)   PU Stage 2 Dressing:  (Left buttocks: Foam.)                   Personal Care Assistance Level of Assistance  Bathing, Feeding, Dressing Bathing Assistance: Maximum assistance Feeding assistance: Limited assistance Dressing  Assistance: Maximum assistance     Functional Limitations Info  Sight, Hearing, Speech Sight Info: Adequate Hearing Info: Adequate Speech Info: Adequate (Slurred at times)    SPECIAL CARE FACTORS FREQUENCY  PT (By licensed PT), OT (By licensed OT)     PT Frequency: 5 x week OT  Frequency: 5 x week            Contractures Contractures Info: Not present    Additional Factors Info  Code Status, Allergies Code Status Info: Full code Allergies Info: NKDA           Current Medications (02/06/2023):  This is the current hospital active medication list Current Facility-Administered Medications  Medication Dose Route Frequency Provider Last Rate Last Admin   acetaminophen (TYLENOL) tablet 650 mg  650 mg Oral Q6H PRN Gertie Fey, MD       Or   acetaminophen (TYLENOL) suppository 650 mg  650 mg Rectal Q6H PRN Gertie Fey, MD       albuterol (PROVENTIL) (2.5 MG/3ML) 0.083% nebulizer solution 3 mL  3 mL Inhalation Q6H PRN Gertie Fey, MD       atorvastatin (LIPITOR) tablet 40 mg  40 mg Oral Daily Gertie Fey, MD   40 mg at 02/06/23 0848   bisacodyl (DULCOLAX) suppository 10 mg  10 mg Rectal Daily PRN Gertie Fey, MD       clopidogrel (PLAVIX) tablet 75 mg  75 mg Oral Daily Gertie Fey, MD   75 mg at 02/06/23 0835   diphenhydrAMINE (BENADRYL) capsule 25 mg  25 mg Oral Q6H PRN Gertie Fey, MD   25 mg at 02/06/23 1426   donepezil (ARICEPT) tablet 10 mg  10 mg Oral QHS Gertie Fey, MD   10 mg at 02/05/23 2149   enoxaparin (LOVENOX) injection 57.5 mg  0.5 mg/kg Subcutaneous Q24H Gertie Fey, MD   57.5 mg at 02/05/23 2148   FLUoxetine (PROZAC) capsule 40 mg  40 mg Oral Daily Gertie Fey, MD   40 mg at 02/06/23 [id number]   furosemide (LASIX) tablet 40 mg  40 mg Oral BID Gertie Fey, MD   40 mg at 02/06/23 0835   insulin aspart (novoLOG) injection 0-15 Units  0-15 Units Subcutaneous TID WC Gertie Fey, MD   5 Units at 02/06/23 1208   insulin aspart (novoLOG) injection 0-5 Units  0-5 Units Subcutaneous QHS Gertie Fey, MD   0 Units at 02/04/23 2336   isosorbide dinitrate (ISORDIL) tablet 10 mg  10 mg Oral TID Gertie Fey, MD   10 mg at 02/06/23 [id number]   levothyroxine (SYNTHROID) tablet 150 mcg  150 mcg Oral Daily Gertie Fey, MD   150 mcg at 02/05/23 0600   linaclotide (LINZESS)  capsule 290 mcg  290 mcg Oral Daily Gertie Fey, MD   290 mcg at 02/06/23 0838   [START ON 02/07/2023] linagliptin (TRADJENTA) tablet 5 mg  5 mg Oral Daily Chinita Greenland A, RPH       magnesium oxide (MAG-OX) tablet 400 mg  400 mg Oral BID Gertie Fey, MD   400 mg at 02/06/23 [id number]   menthol-cetylpyridinium (CEPACOL) lozenge 3 mg  1 lozenge Oral PRN Chinita Greenland A, RPH       midodrine (PROAMATINE) tablet 5 mg  5 mg Oral Daily Gertie Fey, MD   5 mg at 02/06/23 0834   mometasone-formoterol (DULERA) 200-5 MCG/ACT inhaler 2 puff  2 puff Inhalation BID Gertie Fey, MD   2 puff at 02/06/23 0839   omega-3 acid ethyl  esters (LOVAZA) capsule 1 g  1 g Oral Daily Gertie Fey, MD   1 g at 02/06/23 0901   pantoprazole (PROTONIX) EC tablet 40 mg  40 mg Oral Daily Gertie Fey, MD   40 mg at 02/06/23 0848   polyethylene glycol (MIRALAX / GLYCOLAX) packet 17 g  17 g Oral Daily Gertie Fey, MD       polyvinyl alcohol (LIQUIFILM TEARS) 1.4 % ophthalmic solution 1 drop  1 drop Both Eyes QID Chinita Greenland A, RPH   1 drop at 02/06/23 1419   pregabalin (LYRICA) capsule 300 mg  300 mg Oral BID Gertie Fey, MD   300 mg at 02/06/23 [id number]   senna-docusate (Senokot-S) tablet 1 tablet  1 tablet Oral BID Gertie Fey, MD   1 tablet at 02/05/23 1114   simethicone (MYLICON) chewable tablet 80 mg  80 mg Oral BID Gertie Fey, MD   80 mg at 02/05/23 1402   sodium chloride flush (NS) 0.9 % injection 3 mL  3 mL Intravenous Once Gertie Fey, MD       sodium chloride flush (NS) 0.9 % injection 3 mL  3 mL Intravenous Q12H Gertie Fey, MD   3 mL at 02/06/23 0900   traMADol (ULTRAM) tablet 50 mg  50 mg Oral Q12H PRN Hosie Poisson, MD   50 mg at 02/06/23 1459   traZODone (DESYREL) tablet 25 mg  25 mg Oral QHS PRN Gertie Fey, MD         Discharge Medications: Please see discharge summary for a list of discharge medications.  Relevant Imaging Results:  Relevant Lab Results:   Additional Information SS#: [id number]  Candie Chroman, LCSW

## 2023-02-06 NOTE — Progress Notes (Signed)
Speech Language Pathology Treatment: Dysphagia  Patient Details Name: [name] [name] MRN: [id number] DOB: Aug 25, 1946 Today's Date: 02/06/2023 Time: 0820-0905 SLP Time Calculation (min) (ACUTE ONLY): 45 min  Assessment / Plan / Recommendation Clinical Impression  Pt seen for ongoing assessment of swallowing and trials to upgrade oral diet today. NSG present w/ oral medication for pt to take -- applesauce utilized for safer swallowing/education given on such. Pt was awake/alert and fed self given min tray setup/positioning. Pt verbally engaged and chose breakfast meal items; followed directions during Pill swallowing w/ SLP/NSG.  On RA; afebrile; WBC WNL.  Pt appears to present w/ functional oropharyngeal phase swallowing w/ No overt oropharyngeal phase dysphagia noted, No overt neuromuscular deficits impacting swallowing noted. Pt consumed po trials w/ No immediate, overt clinical s/s of aspiration during po trials. Pt appears at reduced risk for aspiration when following general aspiration precautions and when given support w/ sitting up appropriately for eating/drinking.   However, pt does have challenging factors that could impact oropharyngeal swallowing to include Pain/discomfort in R elbow impacting self-feeding(NSG aware), deconditioned status/hospitalization, previous swallowing issues in 2021(seen by Speech: "He has a chronic cough with COPD, esophageal dysmotility and suspected intermittent laryngeal penetration with saliva and po's.". He also has a Baseline of mildly dysarthric speech per the chart and Sister.",), potential Esophageal phase dysmotility, and Baseline Cognitive decline/dx'd Dementia. These factors can increase risk for dysphagia, aspiration as well as decreased oral intake overall.   During po trials, pt consumed all consistencies w/ no overt coughing, decline in vocal quality, or change in respiratory presentation during/post trials. O2 sats remained 98% when assessed. Oral  phase appeared [facility] w/ timely bolus management, mastication, and control of bolus propulsion for A-P transfer for swallowing. Oral clearing achieved w/ all trial consistencies -- moistened, soft foods given.  OM Exam appeared [facility] w/ no unilateral weakness noted. Speech fully intelligible. Pt fed self w/ setup support.   Recommend a fairly Regular consistency diet w/ well-Cut meats, moistened foods; Thin liquids -- CUP drinking only for safety(NO straws). Recommend general aspiration precautions, tray setup and sitting up in a chair best for all meals/oral intake. Reduce distractions at meals. Pills WHOLE in Puree for safer, easier swallowing -- pt stated he did this at his facility "w/ the large ones".  Education given on Pills in Puree; food consistencies and easy to eat options; general aspiration precautions to pt and NSG. NSG to reconsult if any new needs arise. NSG updated, agreed. MD updated. Recommend Dietician f/u for support.      HPI HPI: Per H&P, pt " is a 77 y.o. male with medical history significant of Multiple medical issues including early Dementia, CHF, GERD, anxiety, who resides at a living facility.  However history is obtained from the patient, as well as from the sister at the bedside [name] [name] as well as from a report from the ER provider.  Per sister, when patient is not having medical issues his memory is still reasonably accurate and cannot be relied upon.  Patient describes that he has had progressive weakness in all 4 extremities due to arthritis and unspecified nerve disorder which has caused him to lose ambulation ability for the last 2 years.  Patient is therefore a resident of a nursing home.  Patient is obviously noted to be obese as well during this encounter.  Patient is wheelchair-bound and has not walked in several years and reports that he was feeling his usual self till about 2 weeks ago  when he starts reporting a new onset of sensation of shortness of breath.   Sensation of shortness of breath is pretty much present all the time whether the patient is at rest or exerting himself.  He does not report any aggravating or relieving factors.  Describes it as a "panic attack ".  There is no report of chest pain fever coughing leg swelling.  Patient does report chronic cramping of the legs however.  Patient had been resting all day today and reports his last meal and water intake was actually last evening.  Patient reports waking up around afternoon and having his sensation of shortness of breath as before.  Patient subsequently describes the nursing staff at the residence calling EMS and patient being transported to the ER.  Patient reports no new acute symptoms this afternoon.     History is obtained from patient's sister as well who spoke with social worker [name] [name] at the nursing home.  As per the sister, EMS was called because patient was found to be lethargic and cyanotic this afternoon.  Further history is obtained from the ER provider as well whose documentation corroborates reports of sensation of shortness of breath and "dysarthria ".  Sister is currently at bedside and reports that the patient's speech is at his baseline.  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 speech as sometimes he speaks in a slow drawnout manner especially when he is thinking."      SLP Plan  All goals met      Recommendations for follow up therapy are one component of a multi-disciplinary discharge planning process, led by the attending physician.  Recommendations may be updated based on patient status, additional functional criteria and insurance authorization.    Recommendations  Diet recommendations: Regular;Thin liquid (cut meats, foods) Liquids provided via: Cup Medication Administration: Whole meds with puree Supervision: Patient able to self feed;Intermittent supervision to cue for compensatory strategies Compensations: Minimize  environmental distractions;Slow rate;Small sips/bites;Lingual sweep for clearance of pocketing;Follow solids with liquid Postural Changes and/or Swallow Maneuvers: Out of bed for meals;Seated upright 90 degrees;Upright 30-60 min after meal                General recommendations:  (Dietician f/u) Oral Care Recommendations: Oral care BID;Oral care before and after PO;Staff/trained caregiver to provide oral care (support) Follow Up Recommendations: Follow physician's recommendations for discharge plan and follow up therapies (at next venue of care if indicated) Assistance recommended at discharge: Set up Supervision/Assistance SLP Visit Diagnosis: Dysphagia, unspecified (R13.10) Plan: All goals met             Orinda Kenner, [place], [place]; [place] [phone] (ascom) Gaetan Spieker  02/06/2023, 11:23 AM

## 2023-02-06 NOTE — Evaluation (Signed)
Occupational Therapy Evaluation Patient Details Name: [name] [name] MRN: [id number] DOB: 22-May-1946 Today's Date: 02/06/2023   History of Present Illness [name] [name] is a 77 y.o. male with medical history significant of early dementia,  was brought in for lethargy, and confusion.   .  Patient is wheelchair-bound and has not walked in several years and reports that he was feeling his usual self till about 2 weeks ago when he starts reporting a new onset of sensation of shortness of breath.  He is currently on 2 lit of [place] oxygen to keep sats greater than 90%. CT angio of the chest is negative for PE. CT head is negative for acute stroke. Venous duplex of the lower extremities is negative for acute DVT. VBG is unremarkable. Echocardiogram is unremarkable.   Clinical Impression   Patient received for OT evaluation. See flowsheet below for details of function. Generally, patient requiring MOD A x2 for bed mobility, MIN Ax2 with RW for functional mobility (sidestep at EOB only), and MAX A for most ADLs. Patient will benefit from continued OT while in acute care.       Recommendations for follow up therapy are one component of a multi-disciplinary discharge planning process, led by the attending physician.  Recommendations may be updated based on patient status, additional functional criteria and insurance authorization.   Follow Up Recommendations  Skilled nursing-short term rehab (<3 hours/day)     Assistance Recommended at Discharge Frequent or constant Supervision/Assistance  Patient can return home with the following A lot of help with bathing/dressing/bathroom;Two people to help with walking and/or transfers;Assistance with cooking/housework;Direct supervision/assist for medications management;Direct supervision/assist for financial management;Assist for transportation;Help with stairs or ramp for entrance    Functional Status Assessment  Patient has had a recent decline in their functional  status and demonstrates the ability to make significant improvements in function in a reasonable and predictable amount of time.  Equipment Recommendations  Other (comment) (defer to next venue of care)    Recommendations for Other Services       Precautions / Restrictions Precautions Precautions: Fall Restrictions Weight Bearing Restrictions: No      Mobility Bed Mobility Overal bed mobility: Needs Assistance Bed Mobility: Supine to Sit, Sit to Supine     Supine to sit: Mod assist, +2 for physical assistance, HOB elevated Sit to supine: Mod assist, +2 for physical assistance   General bed mobility comments: needing cues and heavy use of rails.    Transfers Overall transfer level: Needs assistance Equipment used: Rolling walker (2 wheels) (and gait belt) Transfers: Sit to/from Stand Sit to Stand: Min assist, +2 physical assistance           General transfer comment: pt's low back pain from earlier appears to be improved; does not c/o pain with sitting up. Limited standing tolerance. Was able to sidestep at EOB approx 3 feet to the L with MIN A x2 for standing balance and heavy reliance on RW. Pt very fatigued after this level of activity.      Balance Overall balance assessment: Needs assistance Sitting-balance support: Bilateral upper extremity supported, Feet supported Sitting balance-Leahy Scale: Fair     Standing balance support: Bilateral upper extremity supported, Reliant on assistive device for balance Standing balance-Leahy Scale: Poor                             ADL either performed or assessed with clinical judgement   ADL  Overall ADL's : Needs assistance/impaired                                       General ADL Comments: Pt states he has had a decrease in mobility in the past two weeks, although typically uses a w/c and is up daily in w/c and wheeling around facility; for past 2 weeks states he hasn't been up to w/c. Today  pt able to use BIL UE functionally for bed mobility with cues; received with finished lunch tray in front of him, evidence of using UE for ADL. Anticipate pt to be MAX A for LB Dressing and bathing; MOD A UB dressing/bath from seated, set up grooming from seated.     Vision         Perception     Praxis      Pertinent Vitals/Pain Pain Assessment Pain Assessment: 0-10 Pain Score:  (unrated, mild) Pain Location: right elbow Pain Descriptors / Indicators: Grimacing, Aching Pain Intervention(s): Monitored during session (pt stating that his R arm hurts even to the touch; was able to use it to push down through RW during mobility today)     Hand Dominance Right   Extremity/Trunk Assessment Upper Extremity Assessment Upper Extremity Assessment: RUE deficits/detail;Generalized weakness RUE Deficits / Details: Pt noting new R elbow pain since admission; hurts to use or touch. RUE: Unable to fully assess due to pain   Lower Extremity Assessment Lower Extremity Assessment: Generalized weakness       Communication Communication Communication: No difficulties   Cognition Arousal/Alertness: Awake/alert Behavior During Therapy: WFL for tasks assessed/performed Overall Cognitive Status: Within Functional Limits for tasks assessed                                 General Comments: Pt is oriented to person, place, grossly to time (unaware of date, but knows it is late March 2024). Provides history; somewhat vague. Does recognize rehab tech immediately and able to recall multiple names of people he has worked with in the past.     General Comments       Exercises     Shoulder Instructions      Home Living Family/patient expects to be discharged to:: Skilled nursing facility                                 Additional Comments: Remains living at [facility] and receiving rehab.      Prior Functioning/Environment Prior Level of Function : Needs  assist       Physical Assist : Mobility (physical);ADLs (physical) Mobility (physical): Bed mobility;Transfers;Gait ADLs (physical): Bathing;Dressing;Toileting;Grooming;IADLs Mobility Comments: Normally ambulating short distances up until 2 weeks ago. Typically SPT to RW but has been reliant on 2 person assist now. ADLs Comments: Pt states that he has been toileting at bed level, receives assistance for all ADLs except eating. States that he enjoys going antiquing, but hasn't been able to do that for approx 2 years.        OT Problem List: Decreased strength;Decreased range of motion;Decreased activity tolerance;Impaired balance (sitting and/or standing);Decreased safety awareness      OT Treatment/Interventions: Self-care/ADL training;Therapeutic exercise;Therapeutic activities    OT Goals(Current goals can be found in the care plan section) Acute Rehab OT Goals  Patient Stated Goal: Get back to antiquing OT Goal Formulation: With patient Time For Goal Achievement: 02/20/23 Potential to Achieve Goals: Fair ADL Goals Pt Will Perform Grooming: with set-up;sitting Pt Will Perform Upper Body Dressing: with set-up;sitting Pt Will Transfer to Toilet: with supervision;bedside commode  OT Frequency: Min 2X/week    Co-evaluation              AM-PAC OT "6 Clicks" Daily Activity     Outcome Measure Help from another person eating meals?: None Help from another person taking care of personal grooming?: A Little Help from another person toileting, which includes using toliet, bedpan, or urinal?: Total Help from another person bathing (including washing, rinsing, drying)?: A Lot Help from another person to put on and taking off regular upper body clothing?: A Little Help from another person to put on and taking off regular lower body clothing?: Total 6 Click Score: 14   End of Session Equipment Utilized During Treatment: Gait belt;Rolling walker (2 wheels) Nurse Communication:  Mobility status  Activity Tolerance: Patient tolerated treatment well Patient left: in bed;with call bell/phone within reach;with bed alarm set  OT Visit Diagnosis: Unsteadiness on feet (R26.81);Muscle weakness (generalized) (M62.81)                Time: [id number] OT Time Calculation (min): 25 min Charges:  OT General Charges $OT Visit: 1 Visit OT Evaluation $OT Eval Moderate Complexity: 1 Mod OT Treatments $Therapeutic Activity: 8-22 mins  Waymon Amato, MS, OTR/L  Vania Rea 02/06/2023, 4:42 PM

## 2023-02-06 NOTE — Inpatient Diabetes Management (Signed)
Inpatient Diabetes Program Recommendations  AACE/ADA: New Consensus Statement on Inpatient Glycemic Control (2015)  Target Ranges:  Prepandial:   less than 140 mg/dL      Peak postprandial:   less than 180 mg/dL (1-2 hours)      Critically ill patients:  140 - 180 mg/dL    Latest Reference Range & Units 07/05/22 04:39 02/04/23 14:25  Hemoglobin A1C 4.8 - 5.6 % 10.8 (H) 10.4 (H)  252 mg/dl  (H): Data is abnormally high  Latest Reference Range & Units 02/05/23 08:48 02/05/23 16:06 02/05/23 20:20 02/05/23 23:38 02/06/23 04:30 02/06/23 07:19  Glucose-Capillary 70 - 99 mg/dL 182 (H) 188 (H) 128 (H) 144 (H) 160 (H) 168 (H) 152 (H)  (H): Data is abnormally high  Admit with:  Lethargy/ acute encephalopathy of unclear etiology  Dehydration  Acute onset of dysarthria in the context of shortness of breath with cyanosis   History: DM, Early Dementia  SNF DM Meds: Humalog SSI     Janumet 100/1000 mg Daily  Current Orders: Tradjenta 5 mg daily     Novolog Moderate Correction Scale/ SSI (0-15 units) TID AC + HS     Novolog SSI started last PM  CBG 152 this AM   MD- Please consider changing the Novolog SSI coverage to Q4 hours while pt remains NPO    --Will follow patient during hospitalization--  Wyn Quaker RN, MSN, [place] Diabetes Coordinator Inpatient Glycemic Control Team Team Pager: [phone] (8a-5p)

## 2023-02-06 NOTE — Progress Notes (Signed)
[facility]                                                                               [name] [name], is a 77 y.o. male, DOB - [birth year], [id number] Admit date - 02/04/2023    Outpatient Primary MD for the patient is Simpson-Tarokh, Leann, DO  LOS - 1  days    Brief summary    [name] [name] is a 77 y.o. male with medical history significant of early dementia,  was brought in for lethargy, and confusion.   .  Patient is wheelchair-bound and has not walked in several years and reports that he was feeling his usual self till about 2 weeks ago when he starts reporting a new onset of sensation of shortness of breath.  He is currently on 2 lit of [place] oxygen to keep sats greater than 90%. CT angio of the chest is negative for PE. CT head is negative for acute stroke. Venous duplex of the lower extremities is negative for acute DVT. VBG is unremarkable. Echocardiogram is unremarkable.    Assessment & Plan    Assessment and Plan: Lethargy/ acute encephalopathy of unclear etiology.  CT head is negative for acute stroke. VBG is unremarkable.  TSH, vit B12 and ammonia levels are pending.  ? Early dementia- progressing vs polypharmacy. He is alert and appears to be back to baseline. MRI brain without contrast, does not show stroke.  Restart the risperidone tonight.    Dehydration Gentle hydration.    Liver nodule Outpatient eval  Esophageal thickening Patient has prior history of esophageal strictures documented in the chart, this will need further evaluation as an outpatient.  Bradycardia Sinus bradycardia.   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  We are awaiting speech/swallow evaluation in the ER to screen out aspiration and then look forward to starting patient's diet.  CT head wnl.  MRI Brain is negative for stroke.    Hypothyroidism:  Tsh pending. Resume synthroid 150 mcg daily.   Hyperlipidemia:   Continue with Lipitor 40 mg daily.    COPD:  No wheezing heard.  Continue with dulera and albuterol prn.  Currently on 2 lit of [place] oxygen to keep sats greater than 90%.     Type 2 DM;  CBG (last 3)  Recent Labs    02/06/23 0430 02/06/23 0719 02/06/23 1200  GLUCAP 168* 152* 220*    Hemoglobin A1c is 10.4 Continue with SSI.    Hypertension;  Well controlled.  Currently on isosorbide dinitrate,    Chronic diastolic heart failure:  Euvolemic. Resume lasix 40 mg BID.  Echocardiogram unremarkable.    OSA   On CPAP at night.   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 Mild chronic enthesopathic change at the common flexor and common extensor tendon origins. Pain control with tramadol.  Physical therapy.    Body mass index is 32.53 kg/m. Obesity.     Estimated body mass index is 32.53 kg/m as calculated from the following:   Height as of 12/02/22: 6' 2.02" (1.88  m).   Weight as of this encounter: 115 kg.  Code Status: full code.  DVT Prophylaxis:  SCDs Start: 02/04/23 2018   Level of Care: Level of care: Telemetry Medical Family Communication: None at bedside.   Disposition Plan:     Remains inpatient appropriate:  pending work up for lethargy and sob.   Procedures:  MRI brain without contrast.   Consultants:   Neurology.   Antimicrobials:   Anti-infectives (From admission, onward)    None        Medications  Scheduled Meds:  atorvastatin  40 mg Oral Daily   clopidogrel  75 mg Oral Daily   donepezil  10 mg Oral QHS   enoxaparin (LOVENOX) injection  0.5 mg/kg Subcutaneous Q24H   FLUoxetine  40 mg Oral Daily   furosemide  40 mg Oral BID   insulin aspart  0-15 Units Subcutaneous TID WC   insulin aspart  0-5 Units Subcutaneous QHS   isosorbide dinitrate  10 mg Oral TID   levothyroxine  150 mcg Oral Daily   linaclotide  290 mcg Oral Daily   [START ON 02/07/2023] linagliptin  5 mg  Oral Daily   magnesium oxide  400 mg Oral BID   midodrine  5 mg Oral Daily   mometasone-formoterol  2 puff Inhalation BID   omega-3 acid ethyl esters  1 g Oral Daily   pantoprazole  40 mg Oral Daily   polyethylene glycol  17 g Oral Daily   polyvinyl alcohol  1 drop Both Eyes QID   pregabalin  300 mg Oral BID   senna-docusate  1 tablet Oral BID   simethicone  80 mg Oral BID   sodium chloride flush  3 mL Intravenous Once   sodium chloride flush  3 mL Intravenous Q12H   Continuous Infusions: PRN Meds:.acetaminophen **OR** acetaminophen, albuterol, bisacodyl, diphenhydrAMINE, menthol-cetylpyridinium, traMADol, traZODone    Subjective:   [name] [name] was seen and examined today.  Alert and answering questions appropriately.   Objective:   Vitals:   02/05/23 2000 02/05/23 2340 02/06/23 0300 02/06/23 0729  BP: 128/69 (!) 100/55 (!) 125/57 133/65  Pulse: (!) 56 (!) 57 (!) 57 (!) 55  Resp: 18 19 20 16   Temp: 97.7 F (36.5 C) 98.7 F (37.1 C) 98.6 F (37 C) 98.3 F (36.8 C)  TempSrc: Oral     SpO2: 97% 99% 95% 98%  Weight:        Intake/Output Summary (Last 24 hours) at 02/06/2023 1704 Last data filed at 02/06/2023 0544 Gross per 24 hour  Intake --  Output 800 ml  Net -800 ml    Filed Weights   02/04/23 2027  Weight: 115 kg     Exam General exam: Appears calm and comfortable  Respiratory system: Clear to auscultation. Respiratory effort normal. Cardiovascular system: S1 & S2 heard, RRR. No JVD, Gastrointestinal system: Abdomen is nondistended, soft and nontender.  Central nervous system: Alert and oriented. No focal neurological deficits. Extremities: Symmetric 5 x 5 power. Skin: No rashes,  Psychiatry: Mood & affect appropriate.     Data Reviewed:  I have personally reviewed following labs and imaging studies   CBC Lab Results  Component Value Date   WBC 8.3 02/06/2023   RBC 4.74 02/06/2023   HGB 13.3 02/06/2023   HCT 41.3 02/06/2023   MCV 87.1  02/06/2023   MCH 28.1 02/06/2023   PLT 147 (L) 02/06/2023   MCHC 32.2 02/06/2023   RDW 14.8 02/06/2023   LYMPHSABS 0.8  02/06/2023   MONOABS 0.7 02/06/2023   EOSABS 0.4 02/06/2023   BASOSABS 0.1 [id number]     Last metabolic panel Lab Results  Component Value Date   NA 141 02/06/2023   K 4.1 02/06/2023   CL 105 02/06/2023   CO2 27 02/06/2023   BUN 21 02/06/2023   CREATININE 1.07 02/06/2023   GLUCOSE 156 (H) 02/06/2023   GFRNONAA >60 02/06/2023   GFRAA >60 02/20/2019   CALCIUM 9.9 02/06/2023   PHOS 4.1 11/16/2020   PROT 6.9 02/06/2023   ALBUMIN 3.5 02/06/2023   BILITOT 1.7 (H) 02/06/2023   ALKPHOS 107 02/06/2023   AST 19 02/06/2023   ALT 24 02/06/2023   ANIONGAP 14 02/06/2023    CBG (last 3)  Recent Labs    02/06/23 0430 02/06/23 0719 02/06/23 1200  GLUCAP 168* 152* 220*       Coagulation Profile: Recent Labs  Lab 02/04/23 1425  INR 1.1      Radiology Studies: DG Elbow 2 Views Right  Result Date: 02/06/2023 CLINICAL DATA:  Elbow pain and swelling for 2 weeks. EXAM: RIGHT ELBOW - 2 VIEW COMPARISON:  None Available. FINDINGS: There is possible elevation of the distal anterior humeral fat pad equivocal 4 a possible joint effusion. Mildly decreased bone mineralization. Minimal chronic enthesopathic spurring at the triceps insertion on the olecranon. Mild medial elbow joint space narrowing and peripheral degenerative spurring. Mild medial and lateral epicondyle chronic enthesopathic change at the common flexor and common extensor tendon origins, respectively. No acute fracture line is seen. IMPRESSION: 1. Mild medial elbow osteoarthritis. 2. Mild chronic enthesopathic change at the common flexor and common extensor tendon origins. 3. Possible small elbow joint effusion. No definite acute fracture is seen. Electronically Signed   By: Yvonne Kendall M.D.   On: 02/06/2023 16:03   MR BRAIN WO CONTRAST  Result Date: 02/05/2023 CLINICAL DATA:  Lethargy and confusion.   History of dementia EXAM: MRI HEAD WITHOUT CONTRAST TECHNIQUE: Multiplanar, multiecho pulse sequences of the brain and surrounding structures were obtained without intravenous contrast. COMPARISON:  Head CT from yesterday FINDINGS: Brain: No acute infarction, hemorrhage, hydrocephalus, extra-axial collection or mass lesion. Disproportionate subarachnoid spaces but no notable ventriculomegaly. Mild chronic small vessel ischemia in the cerebral Purdy matter and pons. No abnormal mineralization Vascular: Major flow voids are preserved Skull and upper cervical spine: Normal marrow signal. Sinuses/Orbits: Partial bilateral mastoid opacification. Generalized mucosal thickening in the paranasal sinuses. Negative orbits. IMPRESSION: No acute or reversible finding. Electronically Signed   By: Jorje Guild M.D.   On: 02/05/2023 21:04   ECHOCARDIOGRAM COMPLETE  Result Date: 02/05/2023    ECHOCARDIOGRAM REPORT   Patient Name:   WALDO BRUCATO Shell Date of Exam: 02/05/2023 Medical Rec #:  [id number]     Height:       74.0 in Accession #:    [id number]    Weight:       253.5 lb Date of Birth:  August 01, 1946      BSA:          2.404 m Patient Age:    40 years      BP:           159/93 mmHg Patient Gender: M             HR:           57 bpm. Exam Location:  [facility] Procedure: 2D Echo, Cardiac Doppler and Color Doppler Indications:     Cardiomegaly I51.7  History:  Patient has prior history of Echocardiogram examinations, most                  recent 06/29/2022. COPD; Risk Factors:Hypertension and Diabetes.  Sonographer:     Sherrie Sport Referring Phys:  [id number] [facility] GOEL Diagnosing Phys: Ida Rogue MD  Sonographer Comments: Technically difficult study due to poor echo windows, no apical window and no subcostal window. Image acquisition challenging due to COPD and Image acquisition challenging due to patient body habitus. IMPRESSIONS  1. Left ventricular ejection fraction, by estimation, is 60 to 65%. The left ventricle has normal  function. The left ventricle has no regional wall motion abnormalities. There is mild left ventricular hypertrophy. Left ventricular diastolic parameters are indeterminate.  2. Right ventricular systolic function is normal. The right ventricular size is normal.  3. The mitral valve is normal in structure. No evidence of mitral valve regurgitation. No evidence of mitral stenosis.  4. The aortic valve was not well visualized. Aortic valve regurgitation is not visualized. No aortic stenosis is present.  5. The inferior vena cava is normal in size with greater than 50% respiratory variability, suggesting right atrial pressure of 3 mmHg. FINDINGS  Left Ventricle: Left ventricular ejection fraction, by estimation, is 60 to 65%. The left ventricle has normal function. The left ventricle has no regional wall motion abnormalities. The left ventricular internal cavity size was normal in size. There is  mild left ventricular hypertrophy. Left ventricular diastolic parameters are indeterminate. Right Ventricle: The right ventricular size is normal. No increase in right ventricular wall thickness. Right ventricular systolic function is normal. Left Atrium: Left atrial size was normal in size. Right Atrium: Right atrial size was normal in size. Pericardium: There is no evidence of pericardial effusion. Mitral Valve: The mitral valve is normal in structure. No evidence of mitral valve regurgitation. No evidence of mitral valve stenosis. Tricuspid Valve: The tricuspid valve is normal in structure. Tricuspid valve regurgitation is not demonstrated. No evidence of tricuspid stenosis. Aortic Valve: The aortic valve was not well visualized. Aortic valve regurgitation is not visualized. No aortic stenosis is present. Pulmonic Valve: The pulmonic valve was normal in structure. Pulmonic valve regurgitation is not visualized. No evidence of pulmonic stenosis. Aorta: The aortic root is normal in size and structure. Venous: The inferior vena  cava is normal in size with greater than 50% respiratory variability, suggesting right atrial pressure of 3 mmHg. IAS/Shunts: No atrial level shunt detected by color flow Doppler.  LEFT VENTRICLE PLAX 2D LVIDd:         5.60 cm LVIDs:         3.20 cm LV PW:         1.30 cm LV IVS:        0.90 cm LVOT diam:     2.20 cm LVOT Area:     3.80 cm  LEFT ATRIUM         Index LA diam:    1.60 cm 0.67 cm/m   AORTA Ao Root diam: 3.10 cm  SHUNTS Systemic Diam: 2.20 cm Ida Rogue MD Electronically signed by Ida Rogue MD Signature Date/Time: 02/05/2023/12:59:30 PM    Final    US Venous Img Lower Bilateral (DVT)  Result Date: 02/04/2023 CLINICAL DATA:  Bilateral leg and foot cramping. EXAM: BILATERAL LOWER EXTREMITY VENOUS DOPPLER ULTRASOUND TECHNIQUE: Gray-scale sonography with graded compression, as well as color Doppler and duplex ultrasound were performed to evaluate the lower extremity deep venous systems from the level of the  common femoral vein and including the common femoral, femoral, profunda femoral, popliteal and calf veins including the posterior tibial, peroneal and gastrocnemius veins when visible. The superficial great saphenous vein was also interrogated. Spectral Doppler was utilized to evaluate flow at rest and with distal augmentation maneuvers in the common femoral, femoral and popliteal veins. COMPARISON:  None Available. FINDINGS: RIGHT LOWER EXTREMITY Common Femoral Vein: No evidence of thrombus. Normal compressibility, respiratory phasicity and response to augmentation. Saphenofemoral Junction: No evidence of thrombus. Normal compressibility and flow on color Doppler imaging. Profunda Femoral Vein: No evidence of thrombus. Normal compressibility and flow on color Doppler imaging. Femoral Vein: No evidence of thrombus. Normal compressibility, respiratory phasicity and response to augmentation. Popliteal Vein: No evidence of thrombus. Normal compressibility, respiratory phasicity and response to  augmentation. Calf Veins: The RIGHT posterior tibial vein and RIGHT peroneal vein are not visualized. Superficial Great Saphenous Vein: No evidence of thrombus. Normal compressibility. Venous Reflux:  None. Other Findings:  None. LEFT LOWER EXTREMITY Common Femoral Vein: No evidence of thrombus. Normal compressibility, respiratory phasicity and response to augmentation. Saphenofemoral Junction: No evidence of thrombus. Normal compressibility and flow on color Doppler imaging. Profunda Femoral Vein: No evidence of thrombus. Normal compressibility and flow on color Doppler imaging. Femoral Vein: No evidence of thrombus. Normal compressibility, respiratory phasicity and response to augmentation. Popliteal Vein: No evidence of thrombus. Normal compressibility, respiratory phasicity and response to augmentation. Calf Veins: The LEFT posterior tibial vein and LEFT peroneal vein are not visualized. Superficial Great Saphenous Vein: No evidence of thrombus. Normal compressibility. Venous Reflux:  None. Other Findings:  None. IMPRESSION: Limited visualization of the BILATERAL posterior tibial veins and BILATERAL peroneal veins, without evidence of deep venous thrombosis in either lower extremity. Electronically Signed   By: Virgina Norfolk M.D.   On: 02/04/2023 22:46   CT Angio Chest PE W and/or Wo Contrast  Result Date: 02/04/2023 CLINICAL DATA:  PE EXAM: CT ANGIOGRAPHY CHEST CT ABDOMEN AND PELVIS WITH CONTRAST TECHNIQUE: Multidetector CT imaging of the chest was performed using the standard protocol during bolus administration of intravenous contrast. Multiplanar CT image reconstructions and MIPs were obtained to evaluate the vascular anatomy. Multidetector CT imaging of the abdomen and pelvis was performed using the standard protocol during bolus administration of intravenous contrast. RADIATION DOSE REDUCTION: This exam was performed according to the departmental dose-optimization program which includes automated  exposure control, adjustment of the mA and/or kV according to patient size and/or use of iterative reconstruction technique. CONTRAST:  100 mL OMNIPAQUE IOHEXOL 350 MG/ML SOLN COMPARISON:  CT chest 04/05/2019, CT abdomen and pelvis 11/22/2022 FINDINGS: CTA CHEST FINDINGS Cardiovascular: Cardiomegaly. No pericardial effusion. Dense atheromatous calcifications of the aorta and its branches. No filling defects are identified in pulmonary arteries to indicate PE. No aortic aneurysm or dissection identified. Mediastinum/Nodes: Diffuse esophageal wall thickening identified. Endoscopic correlation recommended non emergently to assess for possible esophageal mucosal abnormalities. No suspicious adenopathy. Thyroid is not identified. Airways are patent. Lungs/Pleura: Linear bibasilar subsegmental atelectasis or scarring. No pneumonia or pulmonary edema. No pneumothorax or pleural effusion. Musculoskeletal: Thoracolumbosacral degenerative disc disease. No chest wall abnormalities. Osseous structures are osteopenic. Review of the MIP images confirms the above findings. CT ABDOMEN and PELVIS FINDINGS Hepatobiliary: Subcentimeter liver in the medial left lobe is too small to characterize. No biliary ductal dilatation. No enhancing abnormalities. Unremarkable gallbladder. Pancreas: Unremarkable. No pancreatic ductal dilatation or surrounding inflammatory changes. Spleen: Normal in size without focal abnormality. Adrenals/Urinary Tract: 1.1 cm left adrenal nodule consistent  with an adenoma is stable finding. Bilateral renal cysts identified, the largest 3 cm midpole right. No hydronephrosis or nephrolithiasis. Unremarkable urinary bladder. Stomach/Bowel: No bowel dilatation to suggest obstruction. Diverticulosis of the descending and sigmoid. No appendicitis. Appendix is not identified. No abscesses. Vascular/Lymphatic: Aortic atherosclerosis. No enlarged abdominal or pelvic lymph nodes. Reproductive: Prostate is diminutive.  Other: No abdominal wall hernia or abnormality. No abdominopelvic ascites. Musculoskeletal: Thoracolumbosacral degenerative changes. Review of the MIP images confirms the above findings. IMPRESSION: 1. No PE. 2. Cardiomegaly. 3. Abnormal esophagus with diffuse wall thickening. This can be evaluated endoscopically. 4. Numerous renal cysts. 5. Subcentimeter hypodensity liver, too small to characterize. 6. Diverticulosis. Electronically Signed   By: Sammie Bench M.D.   On: 02/04/2023 18:15   CT ABDOMEN PELVIS W CONTRAST  Result Date: 02/04/2023 CLINICAL DATA:  PE EXAM: CT ANGIOGRAPHY CHEST CT ABDOMEN AND PELVIS WITH CONTRAST TECHNIQUE: Multidetector CT imaging of the chest was performed using the standard protocol during bolus administration of intravenous contrast. Multiplanar CT image reconstructions and MIPs were obtained to evaluate the vascular anatomy. Multidetector CT imaging of the abdomen and pelvis was performed using the standard protocol during bolus administration of intravenous contrast. RADIATION DOSE REDUCTION: This exam was performed according to the departmental dose-optimization program which includes automated exposure control, adjustment of the mA and/or kV according to patient size and/or use of iterative reconstruction technique. CONTRAST:  100 mL OMNIPAQUE IOHEXOL 350 MG/ML SOLN COMPARISON:  CT chest 04/05/2019, CT abdomen and pelvis 11/22/2022 FINDINGS: CTA CHEST FINDINGS Cardiovascular: Cardiomegaly. No pericardial effusion. Dense atheromatous calcifications of the aorta and its branches. No filling defects are identified in pulmonary arteries to indicate PE. No aortic aneurysm or dissection identified. Mediastinum/Nodes: Diffuse esophageal wall thickening identified. Endoscopic correlation recommended non emergently to assess for possible esophageal mucosal abnormalities. No suspicious adenopathy. Thyroid is not identified. Airways are patent. Lungs/Pleura: Linear bibasilar  subsegmental atelectasis or scarring. No pneumonia or pulmonary edema. No pneumothorax or pleural effusion. Musculoskeletal: Thoracolumbosacral degenerative disc disease. No chest wall abnormalities. Osseous structures are osteopenic. Review of the MIP images confirms the above findings. CT ABDOMEN and PELVIS FINDINGS Hepatobiliary: Subcentimeter liver in the medial left lobe is too small to characterize. No biliary ductal dilatation. No enhancing abnormalities. Unremarkable gallbladder. Pancreas: Unremarkable. No pancreatic ductal dilatation or surrounding inflammatory changes. Spleen: Normal in size without focal abnormality. Adrenals/Urinary Tract: 1.1 cm left adrenal nodule consistent with an adenoma is stable finding. Bilateral renal cysts identified, the largest 3 cm midpole right. No hydronephrosis or nephrolithiasis. Unremarkable urinary bladder. Stomach/Bowel: No bowel dilatation to suggest obstruction. Diverticulosis of the descending and sigmoid. No appendicitis. Appendix is not identified. No abscesses. Vascular/Lymphatic: Aortic atherosclerosis. No enlarged abdominal or pelvic lymph nodes. Reproductive: Prostate is diminutive. Other: No abdominal wall hernia or abnormality. No abdominopelvic ascites. Musculoskeletal: Thoracolumbosacral degenerative changes. Review of the MIP images confirms the above findings. IMPRESSION: 1. No PE. 2. Cardiomegaly. 3. Abnormal esophagus with diffuse wall thickening. This can be evaluated endoscopically. 4. Numerous renal cysts. 5. Subcentimeter hypodensity liver, too small to characterize. 6. Diverticulosis. Electronically Signed   By: Sammie Bench M.D.   On: 02/04/2023 18:15       Hosie Poisson M.D. [facility] 02/06/2023, 5:04 PM  Available via Epic secure chat 7am-7pm After 7 pm, please refer to night coverage provider listed on amion.

## 2023-02-06 NOTE — TOC Initial Note (Addendum)
Transition of Care [facility]) - Initial/Assessment Note    Patient Details  Name: [name] [name] MRN: [id number] Date of Birth: 07/10/1946  Transition of Care [facility]) CM/SW Contact:    Candie Chroman, LCSW Phone Number: 02/06/2023, 1:45 PM  Clinical Narrative:   Patient not fully oriented. CSW called patient's sister, introduced role, and explained that discharge planning would be discussed. Patient is a long-term resident at [facility]. PT is recommending rehab when he returns. He will not have 3-night inpatient stay until Sunday. Sister expressed understanding. Left message for admissions coordinator to see if they can accept him over the weekend or if he would have to wait to discharge until Monday. No further concerns. CSW encouraged patient's sister to contact CSW as needed. CSW will continue to follow patient and his sister for support and facilitate return to SNF once medically stable.             3:35 pm: [facility] can accept patient on Sunday. Left sister a voicemail.  4:59 pm: Received call back from sister. Provided update.  Expected Discharge Plan: [place] Barriers to Discharge: Continued Medical Work up   Patient Goals and CMS Choice     Choice offered to / list presented to : NA      Expected Discharge Plan and Services     Post Acute Care Choice: Resumption of Svcs/PTA Provider Living arrangements for the past 2 months: [place]                                      Prior Living Arrangements/Services Living arrangements for the past 2 months: [place] Lives with:: Facility Resident Patient language and need for interpreter reviewed:: Yes Do you feel safe going back to the place where you live?: Yes      Need for Family Participation in Patient Care: Yes (Comment) Care giver support system in place?: Yes (comment)   Criminal Activity/Legal Involvement Pertinent to Current  Situation/Hospitalization: No - Comment as needed  Activities of Daily Living Home Assistive Devices/Equipment: Environmental consultant (specify type), Wheelchair, Eyeglasses ADL Screening (condition at time of admission) Patient's cognitive ability adequate to safely complete daily activities?: No Is the patient deaf or have difficulty hearing?: No Does the patient have difficulty seeing, even when wearing glasses/contacts?: No Does the patient have difficulty concentrating, remembering, or making decisions?: Yes Patient able to express need for assistance with ADLs?: Yes Does the patient have difficulty dressing or bathing?: Yes Independently performs ADLs?: No Communication: Independent Dressing (OT): Needs assistance Is this a change from baseline?: Pre-admission baseline Grooming: Independent Feeding: Independent Bathing: Needs assistance Is this a change from baseline?: Pre-admission baseline Toileting: Needs assistance Is this a change from baseline?: Pre-admission baseline In/Out Bed: Needs assistance Is this a change from baseline?: Pre-admission baseline Walks in Home: Needs assistance Is this a change from baseline?: Pre-admission baseline Does the patient have difficulty walking or climbing stairs?: Yes Weakness of Legs: Both Weakness of Arms/Hands: Both  Permission Sought/Granted Permission sought to share information with : Facility Sport and exercise psychologist, Family Supports Permission granted to share information with : Yes, Verbal Permission Granted  Share Information with NAME: Comptroller  Permission granted to share info w AGENCY: [facility]  Permission granted to share info w Relationship: Sister  Permission granted to share info w Contact Information: [phone]  Emotional Assessment  Attitude/Demeanor/Rapport: Unable to Assess Affect (typically observed): Unable to Assess Orientation: : Oriented to Self, Oriented to Place, Oriented to  Time Alcohol /  Substance Use: Not Applicable Psych Involvement: No (comment)  Admission diagnosis:  Slurred speech [R47.81] Lethargy [R53.83] Dyspnea, unspecified type [R06.00] Acute encephalopathy [G93.40] Patient Active Problem List   Diagnosis Date Noted   Acute encephalopathy 02/05/2023   Lethargy 02/04/2023   Cyanosis 02/04/2023   Speech abnormality 02/04/2023   Bradycardia 02/04/2023   Esophageal thickening 02/04/2023   Liver nodule 02/04/2023   Cerebral infarct ([place]) 02/04/2023   Dehydration 02/04/2023   TIA (transient ischemic attack) 07/05/2022   Abnormal MRI of head 07/05/2022   Uncontrolled diabetes mellitus with hyperglycemia, without long-term current use of insulin ([place])    Morbid obesity (HCC)    Cervical myelopathy with history of cervical discectomy and fusion ([place])    Esophageal dysphagia 11/18/2020   Elevated BUN 11/18/2020   AKI (acute kidney injury) ([place]) 11/18/2020   HCAP (healthcare-associated pneumonia)    Mucus plugging of bronchi    Left cervical radiculopathy 10/26/2020   Acute cervical radiculopathy 10/26/2020   Pressure injury of skin 10/26/2020   Problems with swallowing and mastication    Stricture and stenosis of esophagus    Hx of colonic polyps    Polyp of ascending colon    Bilateral carpal tunnel syndrome 02/13/2020   Neurocardiogenic syncope 03/02/2019   Lymphedema 02/04/2019   Tobacco use 02/03/2019   CHF (congestive heart failure) ([place]) 12/23/2018   Chronic diastolic CHF (congestive heart failure) ([place]) 11/27/2018   Cellulitis 11/27/2018   Hypothyroidism 11/27/2018   OSA (obstructive sleep apnea) 11/27/2018   Hypertension 11/27/2018   Mixed hyperlipidemia 11/27/2018   Protein-calorie malnutrition ([place]) 03/02/2018   Neuropathy 03/02/2018   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   Difficulty in voiding 03/01/2018   Vitamin D deficiency 03/01/2018   Vitamin B12 deficiency 03/01/2018   Insomnia 03/01/2018   COPD (chronic obstructive pulmonary  disease) ([place]) 02/11/2018   Pain due to onychomycosis of nail 02/11/2018   Onychomycosis 02/11/2018   Traumatic ecchymosis of foot, right, initial encounter 02/11/2018   Xerostomia 02/11/2018   Frequent falls 02/11/2018   Acute respiratory failure ([place]) 02/07/2018   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   History of thyroid cancer 05/05/2014   PCP:  Hal Morales, DO Pharmacy:   [place], [place] - [address] [place] [address] Phone: [phone] Fax: [phone]     Social Determinants of Health (SDOH) Social History: SDOH Screenings   Tobacco Use: Medium Risk (02/05/2023)   SDOH Interventions:     Readmission Risk Interventions     No data to display

## 2023-02-07 DIAGNOSIS — K2289 Other specified disease of esophagus: Secondary | ICD-10-CM | POA: Diagnosis not present

## 2023-02-07 DIAGNOSIS — R5383 Other fatigue: Secondary | ICD-10-CM | POA: Diagnosis not present

## 2023-02-07 DIAGNOSIS — R001 Bradycardia, unspecified: Secondary | ICD-10-CM

## 2023-02-07 DIAGNOSIS — G9340 Encephalopathy, unspecified: Secondary | ICD-10-CM | POA: Diagnosis not present

## 2023-02-07 LAB — GLUCOSE, CAPILLARY
Glucose-Capillary: 237 mg/dL — ABNORMAL HIGH (ref 70–99)
Glucose-Capillary: 290 mg/dL — ABNORMAL HIGH (ref 70–99)
Glucose-Capillary: 294 mg/dL — ABNORMAL HIGH (ref 70–99)
Glucose-Capillary: 504 mg/dL (ref 70–99)
Glucose-Capillary: 573 mg/dL (ref 70–99)

## 2023-02-07 LAB — THYROID PANEL WITH TSH
Free Thyroxine Index: 2.9 (ref 1.2–4.9)
T3 Uptake Ratio: 30 % (ref 24–39)
T4, Total: 9.8 ug/dL (ref 4.5–12.0)
TSH: 4.17 u[IU]/mL (ref 0.450–4.500)

## 2023-02-07 MED ORDER — COLCHICINE 0.3 MG HALF TABLET
0.3000 mg | ORAL_TABLET | Freq: Two times a day (BID) | ORAL | Status: DC
  Administered 2023-02-07 – 2023-02-10 (×7): 0.3 mg via ORAL
  Filled 2023-02-07 (×7): qty 1

## 2023-02-07 MED ORDER — INSULIN ASPART 100 UNIT/ML IJ SOLN
3.0000 [IU] | Freq: Three times a day (TID) | INTRAMUSCULAR | Status: DC
  Administered 2023-02-08: 3 [IU] via SUBCUTANEOUS
  Filled 2023-02-07: qty 1

## 2023-02-07 MED ORDER — INSULIN ASPART 100 UNIT/ML IJ SOLN
0.0000 [IU] | Freq: Three times a day (TID) | INTRAMUSCULAR | Status: DC
  Administered 2023-02-08: 20 [IU] via SUBCUTANEOUS
  Administered 2023-02-08: 15 [IU] via SUBCUTANEOUS
  Administered 2023-02-08 – 2023-02-09 (×4): 20 [IU] via SUBCUTANEOUS
  Administered 2023-02-09 – 2023-02-10 (×4): 15 [IU] via SUBCUTANEOUS
  Filled 2023-02-07 (×11): qty 1

## 2023-02-07 MED ORDER — INSULIN GLARGINE-YFGN 100 UNIT/ML ~~LOC~~ SOLN
8.0000 [IU] | Freq: Every day | SUBCUTANEOUS | Status: DC
  Administered 2023-02-07: 8 [IU] via SUBCUTANEOUS
  Filled 2023-02-07 (×2): qty 0.08

## 2023-02-07 MED ORDER — PREDNISONE 50 MG PO TABS
60.0000 mg | ORAL_TABLET | Freq: Every day | ORAL | Status: DC
  Administered 2023-02-07 – 2023-02-08 (×2): 60 mg via ORAL
  Filled 2023-02-07 (×2): qty 1

## 2023-02-07 NOTE — Progress Notes (Deleted)
Received MD order to discharge patient to home, reviewed discharge instructions, home meds,  incision care and follow up appointments with patient and patient verbalized understanding

## 2023-02-07 NOTE — Progress Notes (Signed)
[facility]                                                                               [name] [name], is a 77 y.o. male, DOB - [birth year], [id number] Admit date - 02/04/2023    Outpatient Primary MD for the patient is Simpson-Tarokh, Leann, DO  LOS - 2  days    Brief summary    [name] [name] is a 77 y.o. male with medical history significant of early dementia,  was brought in for lethargy, and confusion.   .  Patient is wheelchair-bound and has not walked in several years and reports that he was feeling his usual self till about 2 weeks ago when he starts reporting a new onset of sensation of shortness of breath.  He is currently on 2 lit of [place] oxygen to keep sats greater than 90%. CT angio of the chest is negative for PE. CT head is negative for acute stroke. Venous duplex of the lower extremities is negative for acute DVT. VBG is unremarkable. Echocardiogram is unremarkable.    Assessment & Plan    Assessment and Plan: Lethargy/ acute encephalopathy of unclear etiology.  CT head is negative for acute stroke. VBG is unremarkable.  TSH, vit B12 and ammonia levels are pending.  ? Early dementia- progressing vs polypharmacy. He is alert and appears to be back to baseline.  MRI brain without contrast, does not show stroke.    Dehydration Resolved.   Liver nodule Outpatient eval  Esophageal thickening Patient has prior history of esophageal strictures documented in the chart, this will need further evaluation as an outpatient.  On PPI .  Recommend outpatient follow up with PPI.   Bradycardia Sinus bradycardia.   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  We are awaiting speech/swallow evaluation in the ER to screen out aspiration and then look forward to starting patient's diet.  CT head wnl.  MRI Brain is negative for stroke.    Hypothyroidism:  Tsh WNL. Marland Kitchen Resume synthroid 150 mcg daily.    Hyperlipidemia:  Continue with Lipitor 40 mg daily.    COPD:  No wheezing heard.  Continue with dulera and albuterol prn.  Weaned off oxygen and he is on RA currently.     Type 2 DM;  CBG (last 3)  Recent Labs    02/07/23 0803 02/07/23 1156 02/07/23 1606  GLUCAP 237* 290* 294*    Hemoglobin A1c is 10.4 Continue with SSI. Added Novolog 3 units TIDAC as he is on steroids now.  Will add low dose semglee as his A1c is 10.4   Hypertension;  Optimal BP parameters.  Currently on isosorbide dinitrate,    Chronic diastolic heart failure:  Euvolemic. Resume lasix 40 mg BID.  Echocardiogram unremarkable.    OSA   On CPAP at night.   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    Right elbow pain: ? Gout flare up.  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 Mild chronic enthesopathic change at the common flexor and common extensor tendon origins. Possible small elbow joint effusion. No definite acute fracture is seen. Pain control  with tramadol. Not improving. No redness seen. Started on colchicine and prednisone.  Physical therapy.  Check uric acid levels.    Body mass index is 36.12 kg/m. Obesity.     Estimated body mass index is 36.12 kg/m as calculated from the following:   Height as of this encounter: 6\' 2"  (1.88 m).   Weight as of this encounter: 127.6 kg.  Code Status: full code.  DVT Prophylaxis:  SCDs Start: 02/04/23 2018   Level of Care: Level of care: Telemetry Medical Family Communication: None at bedside.   Disposition Plan:     Remains inpatient appropriate:  elbow pain on the right.   Procedures:  MRI brain without contrast.   Consultants:   Neurology.   Antimicrobials:   Anti-infectives (From admission, onward)    None        Medications  Scheduled Meds:  atorvastatin  40 mg Oral Daily   clopidogrel  75 mg Oral Daily   colchicine  0.3 mg Oral BID   donepezil  10 mg Oral QHS   enoxaparin (LOVENOX) injection  0.5 mg/kg  Subcutaneous Q24H   FLUoxetine  40 mg Oral Daily   furosemide  40 mg Oral BID   insulin aspart  0-15 Units Subcutaneous TID WC   insulin aspart  0-5 Units Subcutaneous QHS   isosorbide dinitrate  10 mg Oral TID   levothyroxine  150 mcg Oral Daily   linaclotide  290 mcg Oral Daily   linagliptin  5 mg Oral Daily   magnesium oxide  400 mg Oral BID   midodrine  5 mg Oral Daily   mometasone-formoterol  2 puff Inhalation BID   omega-3 acid ethyl esters  1 g Oral Daily   pantoprazole  40 mg Oral Daily   polyethylene glycol  17 g Oral Daily   polyvinyl alcohol  1 drop Both Eyes QID   predniSONE  60 mg Oral QAC breakfast   pregabalin  300 mg Oral BID   risperiDONE  0.5 mg Oral QHS   senna-docusate  1 tablet Oral BID   simethicone  80 mg Oral BID   sodium chloride flush  3 mL Intravenous Once   sodium chloride flush  3 mL Intravenous Q12H   Continuous Infusions: PRN Meds:.acetaminophen **OR** acetaminophen, albuterol, bisacodyl, diphenhydrAMINE, menthol-cetylpyridinium, traMADol, traZODone    Subjective:   [name] [name] was seen and examined today.  Persistent right elbow pain   Objective:   Vitals:   02/07/23 1159 02/07/23 1331 02/07/23 1609 02/07/23 1710  BP: 119/66 122/64 (!) 140/79 (!) 154/76  Pulse: (!) 51 (!) 57 (!) 58 (!) 59  Resp: 16  16   Temp: 98.3 F (36.8 C)  99.5 F (37.5 C)   TempSrc:      SpO2: 96%  93%   Weight:      Height:        Intake/Output Summary (Last 24 hours) at 02/07/2023 1719 Last data filed at 02/07/2023 [id number] Gross per 24 hour  Intake --  Output 800 ml  Net -800 ml    [facility] Weights   02/04/23 2027 02/06/23 1920  Weight: 115 kg 127.6 kg     Exam General exam: Appears calm and comfortable  Respiratory system: Clear to auscultation. Respiratory effort normal. Cardiovascular system: S1 & S2 heard, RRR. No JVD,  No pedal edema. Gastrointestinal system: Abdomen is nondistended, soft and nontender.  Central nervous system: Alert and  oriented. No focal neurological deficits. Extremities: right elbow tenderness and swelling.  Skin: No rashes, Psychiatry: Mood & affect appropriate.      Data Reviewed:  I have personally reviewed following labs and imaging studies   CBC Lab Results  Component Value Date   WBC 8.3 02/06/2023   RBC 4.74 02/06/2023   HGB 13.3 02/06/2023   HCT 41.3 02/06/2023   MCV 87.1 02/06/2023   MCH 28.1 02/06/2023   PLT 147 (L) 02/06/2023   MCHC 32.2 02/06/2023   RDW 14.8 02/06/2023   LYMPHSABS 0.8 02/06/2023   MONOABS 0.7 02/06/2023   EOSABS 0.4 02/06/2023   BASOSABS 0.1 [id number]     Last metabolic panel Lab Results  Component Value Date   NA 141 02/06/2023   K 4.1 02/06/2023   CL 105 02/06/2023   CO2 27 02/06/2023   BUN 21 02/06/2023   CREATININE 1.07 02/06/2023   GLUCOSE 156 (H) 02/06/2023   GFRNONAA >60 02/06/2023   GFRAA >60 02/20/2019   CALCIUM 9.9 02/06/2023   PHOS 4.1 11/16/2020   PROT 6.9 02/06/2023   ALBUMIN 3.5 02/06/2023   BILITOT 1.7 (H) 02/06/2023   ALKPHOS 107 02/06/2023   AST 19 02/06/2023   ALT 24 02/06/2023   ANIONGAP 14 02/06/2023    CBG (last 3)  Recent Labs    02/07/23 0803 02/07/23 1156 02/07/23 1606  GLUCAP 237* 290* 294*       Coagulation Profile: Recent Labs  Lab 02/04/23 1425  INR 1.1      Radiology Studies: DG Elbow 2 Views Right  Result Date: 02/06/2023 CLINICAL DATA:  Elbow pain and swelling for 2 weeks. EXAM: RIGHT ELBOW - 2 VIEW COMPARISON:  None Available. FINDINGS: There is possible elevation of the distal anterior humeral fat pad equivocal 4 a possible joint effusion. Mildly decreased bone mineralization. Minimal chronic enthesopathic spurring at the triceps insertion on the olecranon. Mild medial elbow joint space narrowing and peripheral degenerative spurring. Mild medial and lateral epicondyle chronic enthesopathic change at the common flexor and common extensor tendon origins, respectively. No acute fracture line is  seen. IMPRESSION: 1. Mild medial elbow osteoarthritis. 2. Mild chronic enthesopathic change at the common flexor and common extensor tendon origins. 3. Possible small elbow joint effusion. No definite acute fracture is seen. Electronically Signed   By: Yvonne Kendall M.D.   On: 02/06/2023 16:03   MR BRAIN WO CONTRAST  Result Date: 02/05/2023 CLINICAL DATA:  Lethargy and confusion.  History of dementia EXAM: MRI HEAD WITHOUT CONTRAST TECHNIQUE: Multiplanar, multiecho pulse sequences of the brain and surrounding structures were obtained without intravenous contrast. COMPARISON:  Head CT from yesterday FINDINGS: Brain: No acute infarction, hemorrhage, hydrocephalus, extra-axial collection or mass lesion. Disproportionate subarachnoid spaces but no notable ventriculomegaly. Mild chronic small vessel ischemia in the cerebral [place] matter and pons. No abnormal mineralization Vascular: Major flow voids are preserved Skull and upper cervical spine: Normal marrow signal. Sinuses/Orbits: Partial bilateral mastoid opacification. Generalized mucosal thickening in the paranasal sinuses. Negative orbits. IMPRESSION: No acute or reversible finding. Electronically Signed   By: Jorje Guild M.D.   On: 02/05/2023 21:04       Hosie Poisson M.D. [facility] 02/07/2023, 5:19 PM  Available via Epic secure chat 7am-7pm After 7 pm, please refer to night coverage provider listed on amion.

## 2023-02-08 ENCOUNTER — Inpatient Hospital Stay: Payer: Medicare Other

## 2023-02-08 DIAGNOSIS — G9340 Encephalopathy, unspecified: Secondary | ICD-10-CM | POA: Diagnosis not present

## 2023-02-08 DIAGNOSIS — R5383 Other fatigue: Secondary | ICD-10-CM | POA: Diagnosis not present

## 2023-02-08 DIAGNOSIS — R001 Bradycardia, unspecified: Secondary | ICD-10-CM | POA: Diagnosis not present

## 2023-02-08 DIAGNOSIS — K2289 Other specified disease of esophagus: Secondary | ICD-10-CM | POA: Diagnosis not present

## 2023-02-08 LAB — BASIC METABOLIC PANEL
Anion gap: 8 (ref 5–15)
BUN: 20 mg/dL (ref 8–23)
CO2: 26 mmol/L (ref 22–32)
Calcium: 8.9 mg/dL (ref 8.9–10.3)
Chloride: 100 mmol/L (ref 98–111)
Creatinine, Ser: 1.15 mg/dL (ref 0.61–1.24)
GFR, Estimated: >60 mL/min (ref >60)
Glucose, Bld: 463 mg/dL — ABNORMAL HIGH (ref 70–99)
Potassium: 3.1 mmol/L — ABNORMAL LOW (ref 3.5–5.1)
Sodium: 129 mmol/L — ABNORMAL LOW (ref 135–145)

## 2023-02-08 LAB — CBC WITH DIFFERENTIAL/PLATELET
Abs Immature Granulocytes: 0.04 10*3/uL (ref 0.00–0.07)
Basophils Absolute: 0 10*3/uL (ref 0.0–0.1)
Basophils Relative: 0 %
Eosinophils Absolute: 0 10*3/uL (ref 0.0–0.5)
Eosinophils Relative: 0 %
HCT: 39.9 % (ref 39.0–52.0)
Hemoglobin: 13 g/dL (ref 13.0–17.0)
Immature Granulocytes: 0 %
Lymphocytes Relative: 10 %
Lymphs Abs: 0.9 10*3/uL (ref 0.7–4.0)
MCH: 28.1 pg (ref 26.0–34.0)
MCHC: 32.6 g/dL (ref 30.0–36.0)
MCV: 86.4 fL (ref 80.0–100.0)
Monocytes Absolute: 0.7 10*3/uL (ref 0.1–1.0)
Monocytes Relative: 8 %
Neutro Abs: 7.3 10*3/uL (ref 1.7–7.7)
Neutrophils Relative %: 82 %
Platelets: 143 10*3/uL — ABNORMAL LOW (ref 150–400)
RBC: 4.62 MIL/uL (ref 4.22–5.81)
RDW: 14.4 % (ref 11.5–15.5)
WBC: 8.9 10*3/uL (ref 4.0–10.5)
nRBC: 0 % (ref 0.0–0.2)

## 2023-02-08 LAB — GLUCOSE, CAPILLARY
Glucose-Capillary: 398 mg/dL — ABNORMAL HIGH (ref 70–99)
Glucose-Capillary: 336 mg/dL — ABNORMAL HIGH (ref 70–99)
Glucose-Capillary: 426 mg/dL — ABNORMAL HIGH (ref 70–99)
Glucose-Capillary: 533 mg/dL (ref 70–99)
Glucose-Capillary: 457 mg/dL — ABNORMAL HIGH (ref 70–99)
Glucose-Capillary: 478 mg/dL — ABNORMAL HIGH (ref 70–99)

## 2023-02-08 LAB — URIC ACID: Uric Acid, Serum: 7.3 mg/dL (ref 3.7–8.6)

## 2023-02-08 LAB — GLUCOSE, RANDOM
Glucose, Bld: 507 mg/dL (ref 70–99)
Glucose, Bld: 528 mg/dL (ref 70–99)

## 2023-02-08 MED ORDER — INSULIN ASPART 100 UNIT/ML IJ SOLN
5.0000 [IU] | Freq: Three times a day (TID) | INTRAMUSCULAR | Status: DC
  Administered 2023-02-08 – 2023-02-09 (×3): 5 [IU] via SUBCUTANEOUS
  Filled 2023-02-08 (×3): qty 1

## 2023-02-08 MED ORDER — INSULIN GLARGINE-YFGN 100 UNIT/ML ~~LOC~~ SOLN
15.0000 [IU] | Freq: Every day | SUBCUTANEOUS | Status: DC
  Administered 2023-02-08: 15 [IU] via SUBCUTANEOUS
  Filled 2023-02-08 (×2): qty 0.15

## 2023-02-08 MED ORDER — INSULIN ASPART 100 UNIT/ML IJ SOLN
20.0000 [IU] | Freq: Once | INTRAMUSCULAR | Status: AC
  Administered 2023-02-08: 20 [IU] via SUBCUTANEOUS
  Filled 2023-02-08: qty 1

## 2023-02-08 MED ORDER — POTASSIUM CHLORIDE CRYS ER 20 MEQ PO TBCR
40.0000 meq | EXTENDED_RELEASE_TABLET | Freq: Once | ORAL | Status: AC
  Administered 2023-02-08: 40 meq via ORAL
  Filled 2023-02-08: qty 2

## 2023-02-08 MED ORDER — PREDNISONE 20 MG PO TABS: 40.0000 mg | ORAL_TABLET | Freq: Every day | ORAL | Status: DC

## 2023-02-08 MED ORDER — INSULIN ASPART 100 UNIT/ML IJ SOLN
10.0000 [IU] | Freq: Once | INTRAMUSCULAR | Status: AC
  Administered 2023-02-08: 10 [IU] via SUBCUTANEOUS
  Filled 2023-02-08: qty 1

## 2023-02-08 NOTE — Progress Notes (Signed)
[facility]                                                                               Shedric Iadarola, is a 77 y.o. male, DOB - 07-04-1946, [id number] Admit date - 02/04/2023    Outpatient Primary MD for the patient is Simpson-Tarokh, Leann, DO  LOS - 3  days    Brief summary    [name] [name] is a 77 y.o. male with medical history significant of early dementia,  was brought in for lethargy, and confusion.   .  Patient is wheelchair-bound and has not walked in several years and reports that he was feeling his usual self till about 2 weeks ago when he starts reporting a new onset of sensation of shortness of breath.  He is currently on 2 lit of [place] oxygen to keep sats greater than 90%. CT angio of the chest is negative for PE. CT head is negative for acute stroke. Venous duplex of the lower extremities is negative for acute DVT. VBG is unremarkable. Echocardiogram is unremarkable.    Assessment & Plan    Assessment and Plan: Lethargy/ acute encephalopathy of unclear etiology.  CT head is negative for acute stroke. VBG is unremarkable.  TSH, vit B12 and ammonia levels are pending.  ? Early dementia- progressing vs polypharmacy. He is alert and appears to be back to baseline.  MRI brain without contrast, does not show stroke.    Dehydration Resolved.   Liver nodule Outpatient eval  Esophageal thickening Patient has prior history of esophageal strictures documented in the chart, this will need further evaluation as an outpatient.  On PPI .  Recommend outpatient follow up with PPI.   Bradycardia Sinus bradycardia.   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  We are awaiting speech/swallow evaluation in the ER to screen out aspiration and then look forward to starting patient's diet.  CT head wnl.  MRI Brain is negative for stroke.    Hypothyroidism:  Tsh WNL. Marland Kitchen Resume synthroid 150 mcg daily.    Hyperlipidemia:  Continue with Lipitor 40 mg daily.    COPD:  No wheezing heard.  Continue with dulera and albuterol prn.  Weaned off oxygen and he is on RA currently.     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  CBG (last 3)  Recent Labs    02/08/23 0602 02/08/23 0804 02/08/23 1145  GLUCAP 398* 336* 426*    Hemoglobin A1c is 10.4 Continue with  resistant scale SSI. Added Novolog 5 units TIDAC as he is on steroids now.  Increased Semglee to 15 units at night time.    Hypertension;  Well controlled.  Currently on isosorbide dinitrate,    Chronic diastolic heart failure:  Euvolemic. Resume lasix 40 mg BID.  Echocardiogram unremarkable.    OSA   On CPAP at night.   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    Right elbow pain: ? Gout flare up.  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 Mild chronic enthesopathic change at the common flexor and common extensor tendon origins. Possible small elbow joint effusion. No  definite acute fracture is seen.  Started on colchicine and prednisone. Mild redness , tenderness improving, but still painful to move.  Physical therapy. Uric acid levels wnl.  Ordered CT elbow for further evaluation.    Body mass index is 36.12 kg/m. Obesity.     Estimated body mass index is 36.12 kg/m as calculated from the following:   Height as of this encounter: 6\' 2"  (1.88 m).   Weight as of this encounter: 127.6 kg.  Code Status: full code.  DVT Prophylaxis:  SCDs Start: 02/04/23 2018   Level of Care: Level of care: Telemetry Medical Family Communication: None at bedside.   Disposition Plan:     Remains inpatient appropriate:  elbow pain on the right. Further work up with CT elbow.   Procedures:  MRI brain without contrast.   Consultants:   Neurology.   Antimicrobials:   Anti-infectives (From admission, onward)    None        Medications  Scheduled Meds:  atorvastatin  40 mg Oral Daily    clopidogrel  75 mg Oral Daily   colchicine  0.3 mg Oral BID   donepezil  10 mg Oral QHS   enoxaparin (LOVENOX) injection  0.5 mg/kg Subcutaneous Q24H   FLUoxetine  40 mg Oral Daily   furosemide  40 mg Oral BID   insulin aspart  0-20 Units Subcutaneous TID AC & HS   insulin aspart  5 Units Subcutaneous TID WC   insulin glargine-yfgn  15 Units Subcutaneous QHS   isosorbide dinitrate  10 mg Oral TID   levothyroxine  150 mcg Oral Daily   linaclotide  290 mcg Oral Daily   linagliptin  5 mg Oral Daily   magnesium oxide  400 mg Oral BID   midodrine  5 mg Oral Daily   mometasone-formoterol  2 puff Inhalation BID   omega-3 acid ethyl esters  1 g Oral Daily   pantoprazole  40 mg Oral Daily   polyethylene glycol  17 g Oral Daily   polyvinyl alcohol  1 drop Both Eyes QID   potassium chloride  40 mEq Oral Once   [START ON 02/09/2023] predniSONE  40 mg Oral QAC breakfast   pregabalin  300 mg Oral BID   risperiDONE  0.5 mg Oral QHS   senna-docusate  1 tablet Oral BID   simethicone  80 mg Oral BID   sodium chloride flush  3 mL Intravenous Once   sodium chloride flush  3 mL Intravenous Q12H   Continuous Infusions: PRN Meds:.acetaminophen **OR** acetaminophen, albuterol, bisacodyl, diphenhydrAMINE, menthol-cetylpyridinium, traMADol, traZODone    Subjective:   [name] [name] was seen and examined today.  Elbow pain is improving. But not resolved.  Objective:   Vitals:   02/07/23 1710 02/07/23 2153 02/08/23 0543 02/08/23 0803  BP: (!) 154/76 (!) 159/82 139/68 131/76  Pulse: (!) 59 63 (!) 54 (!) 53  Resp:  18 18 20   Temp:  97.6 F (36.4 C) 97.9 F (36.6 C) 98.8 F (37.1 C)  TempSrc:      SpO2:  98% 95% 96%  Weight:      Height:        Intake/Output Summary (Last 24 hours) at 02/08/2023 1517 Last data filed at 02/08/2023 1426 Gross per 24 hour  Intake 640 ml  Output 2050 ml  Net -1410 ml    Filed Weights   02/04/23 2027 02/06/23 1920  Weight: 115 kg 127.6 kg     Exam General  exam: Appears calm  and comfortable  Respiratory system: Clear to auscultation. Respiratory effort normal. Cardiovascular system: S1 & S2 heard, RRR. No JVD, murmurs, Gastrointestinal system: Abdomen is nondistended, soft and nontender.  Central nervous system: Alert and oriented. No focal neurological deficits. Extremities: right elbow swollen and painful  Skin: No rashes,  Psychiatry:  Mood & affect appropriate.       Data Reviewed:  I have personally reviewed following labs and imaging studies   CBC Lab Results  Component Value Date   WBC 8.9 02/08/2023   RBC 4.62 02/08/2023   HGB 13.0 02/08/2023   HCT 39.9 02/08/2023   MCV 86.4 02/08/2023   MCH 28.1 02/08/2023   PLT 143 (L) 02/08/2023   MCHC 32.6 02/08/2023   RDW 14.4 02/08/2023   LYMPHSABS 0.9 02/08/2023   MONOABS 0.7 02/08/2023   EOSABS 0.0 02/08/2023   BASOSABS 0.0 [id number]     Last metabolic panel Lab Results  Component Value Date   NA 129 (L) 02/08/2023   K 3.1 (L) 02/08/2023   CL 100 02/08/2023   CO2 26 02/08/2023   BUN 20 02/08/2023   CREATININE 1.15 02/08/2023   GLUCOSE 463 (H) 02/08/2023   GFRNONAA >60 02/08/2023   GFRAA >60 02/20/2019   CALCIUM 8.9 02/08/2023   PHOS 4.1 11/16/2020   PROT 6.9 02/06/2023   ALBUMIN 3.5 02/06/2023   BILITOT 1.7 (H) 02/06/2023   ALKPHOS 107 02/06/2023   AST 19 02/06/2023   ALT 24 02/06/2023   ANIONGAP 8 02/08/2023    CBG (last 3)  Recent Labs    02/08/23 0602 02/08/23 0804 02/08/23 1145  GLUCAP 398* 336* 426*       Coagulation Profile: Recent Labs  Lab 02/04/23 1425  INR 1.1      Radiology Studies: DG Elbow 2 Views Right  Result Date: 02/06/2023 CLINICAL DATA:  Elbow pain and swelling for 2 weeks. EXAM: RIGHT ELBOW - 2 VIEW COMPARISON:  None Available. FINDINGS: There is possible elevation of the distal anterior humeral fat pad equivocal 4 a possible joint effusion. Mildly decreased bone mineralization. Minimal chronic enthesopathic spurring  at the triceps insertion on the olecranon. Mild medial elbow joint space narrowing and peripheral degenerative spurring. Mild medial and lateral epicondyle chronic enthesopathic change at the common flexor and common extensor tendon origins, respectively. No acute fracture line is seen. IMPRESSION: 1. Mild medial elbow osteoarthritis. 2. Mild chronic enthesopathic change at the common flexor and common extensor tendon origins. 3. Possible small elbow joint effusion. No definite acute fracture is seen. Electronically Signed   By: Yvonne Kendall M.D.   On: 02/06/2023 16:03       Hosie Poisson M.D. [facility] 02/08/2023, 3:17 PM  Available via Epic secure chat 7am-7pm After 7 pm, please refer to night coverage provider listed on amion.

## 2023-02-08 NOTE — Progress Notes (Signed)
       CROSS COVER NOTE  NAME: [name] [name] MRN: [id number] DOB : Aug 19, 1946 ATTENDING PHYSICIAN: [name] Poisson, MD    Date of Service   02/08/2023   HPI/Events of Note   Report/Request "Pt admitted with lethargy/ acute encephalopathy of unclear etiology. He is type 2 DM. Was started on steroids yesterday, with dosage & insulin orders adjusted today. POC glucose was 478. Rechecked, =457. Have ordered stat lab glucose."   On Review of chart [name] [name]'s CBGs have been 400+ on multiple occassions today   Interventions   Assessment/Plan:  Ordered additional 10U of Novolog for a total of 30U Novolog tonight      To reach the provider On-Call:   7AM- 7PM see care teams to locate the attending and reach out to them via [email or web address] Password: [email or web address] 7PM-7AM contact night-coverage If you still have difficulty reaching the appropriate provider, please page the [facility] (Director on Call) for [facility] on amion for assistance  This document was prepared using Systems analyst and may include unintentional dictation errors.  Neomia Glass DNP, MBA, FNP-BC, PMHNP-BC Nurse Practitioner [facility] Pager [phone]

## 2023-02-09 DIAGNOSIS — K2289 Other specified disease of esophagus: Secondary | ICD-10-CM | POA: Diagnosis not present

## 2023-02-09 DIAGNOSIS — E860 Dehydration: Secondary | ICD-10-CM | POA: Diagnosis not present

## 2023-02-09 DIAGNOSIS — R5383 Other fatigue: Secondary | ICD-10-CM | POA: Diagnosis not present

## 2023-02-09 DIAGNOSIS — G9340 Encephalopathy, unspecified: Secondary | ICD-10-CM | POA: Diagnosis not present

## 2023-02-09 LAB — BASIC METABOLIC PANEL
Anion gap: 13 (ref 5–15)
BUN: 23 mg/dL (ref 8–23)
CO2: 26 mmol/L (ref 22–32)
Calcium: 9.3 mg/dL (ref 8.9–10.3)
Chloride: 96 mmol/L — ABNORMAL LOW (ref 98–111)
Creatinine, Ser: 0.92 mg/dL (ref 0.61–1.24)
GFR, Estimated: >60 mL/min (ref >60)
Glucose, Bld: 336 mg/dL — ABNORMAL HIGH (ref 70–99)
Potassium: 4 mmol/L (ref 3.5–5.1)
Sodium: 135 mmol/L (ref 135–145)

## 2023-02-09 LAB — GLUCOSE, CAPILLARY
Glucose-Capillary: 317 mg/dL — ABNORMAL HIGH (ref 70–99)
Glucose-Capillary: 341 mg/dL — ABNORMAL HIGH (ref 70–99)
Glucose-Capillary: 376 mg/dL — ABNORMAL HIGH (ref 70–99)
Glucose-Capillary: 382 mg/dL — ABNORMAL HIGH (ref 70–99)
Glucose-Capillary: 397 mg/dL — ABNORMAL HIGH (ref 70–99)
Glucose-Capillary: 410 mg/dL — ABNORMAL HIGH (ref 70–99)

## 2023-02-09 MED ORDER — INSULIN GLARGINE-YFGN 100 UNIT/ML ~~LOC~~ SOLN
25.0000 [IU] | Freq: Every day | SUBCUTANEOUS | Status: DC
  Administered 2023-02-09: 25 [IU] via SUBCUTANEOUS
  Filled 2023-02-09: qty 0.25

## 2023-02-09 MED ORDER — INSULIN ASPART 100 UNIT/ML IJ SOLN
8.0000 [IU] | Freq: Three times a day (TID) | INTRAMUSCULAR | Status: DC
  Administered 2023-02-09 – 2023-02-10 (×3): 8 [IU] via SUBCUTANEOUS
  Filled 2023-02-09 (×2): qty 1

## 2023-02-09 MED ORDER — POLYETHYLENE GLYCOL 3350 17 G PO PACK: 17.0000 g | PACK | Freq: Every day | ORAL | Status: DC | PRN

## 2023-02-09 MED ORDER — SENNOSIDES-DOCUSATE SODIUM 8.6-50 MG PO TABS: 1.0000 | ORAL_TABLET | Freq: Every evening | ORAL | Status: DC | PRN

## 2023-02-09 NOTE — Progress Notes (Signed)
[facility]                                                                               [name] [name], is a 77 y.o. male, DOB - 06-25-1946, [id number] Admit date - 02/04/2023    Outpatient Primary MD for the patient is Simpson-Tarokh, Leann, DO  LOS - 4  days    Brief summary    [name] [name] is a 77 y.o. male with medical history significant of early dementia,  was brought in for lethargy, and confusion.   .  Patient is wheelchair-bound and has not walked in several years and reports that he was feeling his usual self till about 2 weeks ago when he starts reporting a new onset of sensation of shortness of breath.  He is currently on 2 lit of [place] oxygen to keep sats greater than 90%. CT angio of the chest is negative for PE. CT head is negative for acute stroke. Venous duplex of the lower extremities is negative for acute DVT. VBG is unremarkable. Echocardiogram is unremarkable.    Assessment & Plan    Assessment and Plan: Lethargy/ acute encephalopathy of unclear etiology.  CT head is negative for acute stroke. VBG is unremarkable.  TSH, vit B12 and ammonia levels are pending.  ? Early dementia- progressing vs polypharmacy. He is alert and appears to be back to baseline.  MRI brain without contrast, does not show stroke.    Dehydration Resolved.   Liver nodule Outpatient eval  Esophageal thickening Patient has prior history of esophageal strictures documented in the chart, this will need further evaluation as an outpatient.  On PPI .  Recommend outpatient follow up with PPI.   Bradycardia Sinus bradycardia.   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  We are awaiting speech/swallow evaluation in the ER to screen out aspiration and then look forward to starting patient's diet.  CT head wnl.  MRI Brain is negative for stroke.    Hypothyroidism:  Tsh WNL. Marland Kitchen Resume synthroid 150 mcg daily.    Hyperlipidemia:  Continue with Lipitor 40 mg daily.    COPD:  No wheezing heard.  Continue with dulera and albuterol prn.  Weaned off oxygen and he is on RA currently.     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  CBG (last 3)  Recent Labs    02/09/23 1130 02/09/23 1152 02/09/23 1611  GLUCAP 410* 397* 382*    Hemoglobin A1c is 10.4 Continue with  resistant scale SSI. Increased 8 units of novolog TIDAC as he is on steroids now.  Increased Semglee to 25 units at qhs.   Hypertension;  Optimal.  Currently on isosorbide dinitrate,    Chronic diastolic heart failure:  Euvolemic. Resume lasix 40 mg BID.  Echocardiogram unremarkable.    OSA   On CPAP at night.   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    Right elbow pain: ? Gout flare up.  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 Mild chronic enthesopathic change at the common flexor and common extensor tendon origins. Possible small elbow joint effusion. No definite acute  fracture is seen.  Started on colchicine and prednisone. Mild redness , tenderness improving, but still painful to move.  Physical therapy. Uric acid levels wnl.  Ordered CT elbow for further evaluation. It shows No acute osseous abnormality or significant joint effusion. Mild osteoarthritis of the elbow.  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 Subcutaneous soft tissue swelling along the posterior elbow and proximal forearm.  will stop the prednisone and continue the colchicine as his pain is improving.  Patient feels he is ready for discharge today, wants to be discharged in am.   Body mass index is 36.12 kg/m. Obesity.     Estimated body mass index is 36.12 kg/m as calculated from the following:   Height as of this encounter: 6\' 2"  (1.88 m).   Weight as of this encounter: 127.6 kg.  Code Status: full code.  DVT Prophylaxis:  SCDs Start: 02/04/23  2018   Level of Care: Level of care: Telemetry Medical Family Communication: None at bedside.   Disposition Plan:     Remains inpatient appropriate:  elbow pain on the right.  Procedures:  MRI brain without contrast.   Consultants:   Neurology.   Antimicrobials:   Anti-infectives (From admission, onward)    None        Medications  Scheduled Meds:  atorvastatin  40 mg Oral Daily   clopidogrel  75 mg Oral Daily   colchicine  0.3 mg Oral BID   donepezil  10 mg Oral QHS   enoxaparin (LOVENOX) injection  0.5 mg/kg Subcutaneous Q24H   FLUoxetine  40 mg Oral Daily   furosemide  40 mg Oral BID   insulin aspart  0-20 Units Subcutaneous TID AC & HS   insulin aspart  8 Units Subcutaneous TID WC   insulin glargine-yfgn  15 Units Subcutaneous QHS   isosorbide dinitrate  10 mg Oral TID   levothyroxine  150 mcg Oral Daily   linaclotide  290 mcg Oral Daily   linagliptin  5 mg Oral Daily   magnesium oxide  400 mg Oral BID   midodrine  5 mg Oral Daily   mometasone-formoterol  2 puff Inhalation BID   omega-3 acid ethyl esters  1 g Oral Daily   pantoprazole  40 mg Oral Daily   polyvinyl alcohol  1 drop Both Eyes QID   pregabalin  300 mg Oral BID   risperiDONE  0.5 mg Oral QHS   simethicone  80 mg Oral BID   sodium chloride flush  3 mL Intravenous Once   sodium chloride flush  3 mL Intravenous Q12H   Continuous Infusions: PRN Meds:.acetaminophen **OR** acetaminophen, albuterol, bisacodyl, diphenhydrAMINE, menthol-cetylpyridinium, polyethylene glycol, senna-docusate, traMADol, traZODone    Subjective:   [name] was seen and examined today.  Elbow pain is  much better.  Objective:   Vitals:   02/08/23 2059 02/09/23 0504 02/09/23 0731 02/09/23 1612  BP: 128/73 118/81 (!) 149/75 135/77  Pulse: (!) 57 (!) 53 (!) 52 (!) 53  Resp: 20 18 20 19   Temp: 97.6 F (36.4 C) 97.7 F (36.5 C) 98.1 F (36.7 C)   TempSrc: Oral Oral    SpO2: 94% 96% 97% 97%  Weight:       Height:        Intake/Output Summary (Last 24 hours) at 02/09/2023 1616 Last data filed at 02/09/2023 1400 Gross per 24 hour  Intake 560 ml  Output --  Net 560 ml    Filed  Weights   02/04/23 2027 02/06/23 1920  Weight: 115 kg 127.6 kg     Exam General exam: Appears calm and comfortable  Respiratory system: Clear to auscultation. Respiratory effort normal. Cardiovascular system: S1 & S2 heard, RRR. No JVD,  Gastrointestinal system: Abdomen is nondistended, soft and nontender. Central nervous system: Alert and oriented. No focal neurological deficits. Extremities: elbow swelling improving.  Skin: No rashes,  Psychiatry: . Mood & affect appropriate.        Data Reviewed:  I have personally reviewed following labs and imaging studies   CBC Lab Results  Component Value Date   WBC 8.9 02/08/2023   RBC 4.62 02/08/2023   HGB 13.0 02/08/2023   HCT 39.9 02/08/2023   MCV 86.4 02/08/2023   MCH 28.1 02/08/2023   PLT 143 (L) 02/08/2023   MCHC 32.6 02/08/2023   RDW 14.4 02/08/2023   LYMPHSABS 0.9 02/08/2023   MONOABS 0.7 02/08/2023   EOSABS 0.0 02/08/2023   BASOSABS 0.0 99991111     Last metabolic panel Lab Results  Component Value Date   NA 135 02/09/2023   K 4.0 02/09/2023   CL 96 (L) 02/09/2023   CO2 26 02/09/2023   BUN 23 02/09/2023   CREATININE 0.92 02/09/2023   GLUCOSE 336 (H) 02/09/2023   GFRNONAA >60 02/09/2023   GFRAA >60 02/20/2019   CALCIUM 9.3 02/09/2023   PHOS 4.1 11/16/2020   PROT 6.9 02/06/2023   ALBUMIN 3.5 02/06/2023   BILITOT 1.7 (H) 02/06/2023   ALKPHOS 107 02/06/2023   AST 19 02/06/2023   ALT 24 02/06/2023   ANIONGAP 13 02/09/2023    CBG (last 3)  Recent Labs    02/09/23 1130 02/09/23 1152 02/09/23 1611  GLUCAP 410* 397* 382*       Coagulation Profile: Recent Labs  Lab 02/04/23 1425  INR 1.1      Radiology Studies: CT ELBOW RIGHT WO CONTRAST  Result Date: 02/09/2023 CLINICAL DATA:  elbow pain and swelling.  EXAM: CT OF THE UPPER RIGHT EXTREMITY WITHOUT CONTRAST TECHNIQUE: Multidetector CT imaging of the upper right extremity was performed according to the standard protocol. RADIATION DOSE REDUCTION: This exam was performed according to the departmental dose-optimization program which includes automated exposure control, adjustment of the mA and/or kV according to patient size and/or use of iterative reconstruction technique. COMPARISON:  Right elbow radiograph 02/06/2023 FINDINGS: Bones/Joint/Cartilage There is no evidence of acute fracture. Alignment is normal. There is no significant joint effusion. There is mild ulnotrochlear degenerative change. Ligaments Suboptimally assessed by CT. Muscles and Tendons No acute myotendinous abnormality by CT. There is enthesophyte formation at the medial and lateral epicondyles. Soft tissues There is subcutaneous soft tissue swelling along the posterior elbow and proximal forearm. IMPRESSION: No acute osseous abnormality or significant joint effusion. Mild osteoarthritis of the elbow. 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 Subcutaneous soft tissue swelling along the posterior elbow and proximal forearm. Electronically Signed   By: Maurine Simmering M.D.   On: 02/09/2023 09:10       Hosie Poisson M.D. [facility] 02/09/2023, 4:16 PM  Available via Epic secure chat 7am-7pm After 7 pm, please refer to night coverage provider listed on amion.

## 2023-02-09 NOTE — Care Management Important Message (Signed)
Important Message  Patient Details  Name: [name] [name] MRN: [id number] Date of Birth: 06-16-1946   Medicare Important Message Given:  Yes     Dannette Barbara 02/09/2023, 11:31 AM

## 2023-02-09 NOTE — Progress Notes (Signed)
Physical Therapy Treatment Patient Details Name: [name] [name] MRN: [id number] DOB: 04/27/1946 Today's Date: 02/09/2023   History of Present Illness [name] [name] is a 77 y.o. male with medical history significant of early dementia,  was brought in for lethargy, and confusion.   .  Patient is wheelchair-bound and has not walked in several years and reports that he was feeling his usual self till about 2 weeks ago when he starts reporting a new onset of sensation of shortness of breath.  He is currently on 2 lit of [place] oxygen to keep sats greater than 90%. CT angio of the chest is negative for PE. CT head is negative for acute stroke. Venous duplex of the lower extremities is negative for acute DVT. VBG is unremarkable. Echocardiogram is unremarkable.    PT Comments    Pt received upright in bed agreeable to PT services. Pt declining transfer or standing attempts due to his R elbow pain with fear of exacerbating it WB on RW. Pt improving with bed mobility needing minA+2 and bed features needing some minA+2 to scoot to feet to reach floor. Pt with fair sitting balance with LE therex and does not rely on BUE support on bed to maintain upright. Pt also declines onset of LBP in sitting which was a big barrier at last session to progressing his mobility. Pt laterally scoots to the R to Hob at supervision level and returns to upright in bed at modA+2 primarily needing support at LE's. All needs in reach. Continue to rec f/u rehab services at discharge as pt below baseline of safely standing and transferring.     Recommendations for follow up therapy are one component of a multi-disciplinary discharge planning process, led by the attending physician.  Recommendations may be updated based on patient status, additional functional criteria and insurance authorization.  Follow Up Recommendations  Can patient physically be transported by private vehicle: No    Assistance Recommended at Discharge Intermittent  Supervision/Assistance  Patient can return home with the following Two people to help with walking and/or transfers;Two people to help with bathing/dressing/bathroom;Help with stairs or ramp for entrance   Equipment Recommendations  None recommended by PT    Recommendations for Other Services       Precautions / Restrictions Precautions Precautions: Fall Restrictions Weight Bearing Restrictions: No     Mobility  Bed Mobility Overal bed mobility: Needs Assistance Bed Mobility: Supine to Sit, Sit to Supine     Supine to sit: Min assist, +2 for physical assistance, HOB elevated Sit to supine: Mod assist, +2 for physical assistance   General bed mobility comments: needing cues and heavy use of rails. Patient Response: Cooperative  Transfers                   General transfer comment: Pt declines standing or scoot transfer    Ambulation/Gait                   Stairs             Wheelchair Mobility    Modified Rankin (Stroke Patients Only)       Balance Overall balance assessment: Needs assistance Sitting-balance support: Bilateral upper extremity supported, Feet supported Sitting balance-Leahy Scale: Fair                                      Cognition Arousal/Alertness: Awake/alert Behavior During  Therapy: WFL for tasks assessed/performed Overall Cognitive Status: Within Functional Limits for tasks assessed                                 General Comments: Agreeable ti sitting EOB performing LE therex. Declining Standing or transfer attempts due to R elbow pain        Exercises General Exercises - Lower Extremity Long Arc Quad: AROM, Seated, Strengthening, Both, 10 reps Hip Flexion/Marching: AROM, Seated, Strengthening, Both, 10 reps Toe Raises: Seated, AROM, Strengthening, Both, 10 reps Heel Raises: Seated, AROM, Strengthening, Both, 10 reps    General Comments        Pertinent Vitals/Pain Pain  Assessment Pain Assessment: Faces Faces Pain Scale: Hurts little more Pain Location: right elbow Pain Descriptors / Indicators: Grimacing, Aching Pain Intervention(s): Monitored during session, Repositioned    Home Living                          Prior Function            PT Goals (current goals can now be found in the care plan section) Acute Rehab PT Goals Patient Stated Goal: improve mobility at [facility] Time For Goal Achievement: 02/20/23 Potential to Achieve Goals: Fair    Frequency    Min 2X/week      PT Plan Current plan remains appropriate    Co-evaluation              AM-PAC PT "6 Clicks" Mobility   Outcome Measure  Help needed turning from your back to your side while in a flat bed without using bedrails?: A Lot Help needed moving from lying on your back to sitting on the side of a flat bed without using bedrails?: A Lot Help needed moving to and from a bed to a chair (including a wheelchair)?: Total Help needed standing up from a chair using your arms (e.g., wheelchair or bedside chair)?: A Lot Help needed to walk in hospital room?: Total Help needed climbing 3-5 steps with a railing? : Total 6 Click Score: 9    End of Session   Activity Tolerance: Patient limited by pain Patient left: in bed;with call bell/phone within reach;with bed alarm set Nurse Communication: Mobility status PT Visit Diagnosis: Other abnormalities of gait and mobility (R26.89);Muscle weakness (generalized) (M62.81);History of falling (Z91.81)     Time: [id number] PT Time Calculation (min) (ACUTE ONLY): 14 min  Charges:  $Therapeutic Exercise: 8-22 mins                     Salem Caster. Fairly IV, PT, DPT Physical Therapist- [facility]  02/09/2023, 12:05 PM

## 2023-02-09 NOTE — Inpatient Diabetes Management (Signed)
Inpatient Diabetes Program Recommendations  AACE/ADA: New Consensus Statement on Inpatient Glycemic Control   Target Ranges:  Prepandial:   less than 140 mg/dL      Peak postprandial:   less than 180 mg/dL (1-2 hours)      Critically ill patients:  140 - 180 mg/dL    Latest Reference Range & Units 02/09/23 00:31 02/09/23 07:34  Glucose-Capillary 70 - 99 mg/dL 376 (H) 317 (H)    Latest Reference Range & Units 02/08/23 08:04 02/08/23 11:45 02/08/23 18:02 02/08/23 19:53 02/08/23 20:20  Glucose-Capillary 70 - 99 mg/dL 336 (H) 426 (H) 533 (HH) 478 (H) 457 (H)    Latest Reference Range & Units 07/05/22 04:39 02/04/23 14:25  Hemoglobin A1C 4.8 - 5.6 % 10.8 (H) 10.4 (H)   Review of Glycemic Control  Diabetes history: DM2 Outpatient Diabetes medications: Humalog correction TID, Sitagliptin-Metformin [id number] mg daily Current orders for Inpatient glycemic control: Semglee 15 units daily, Novolog 0-20 units AC&HS, Novolog 5 units TID with meals, Tradjenta 5 mg daily; Predisone 40 mg QAM  Inpatient Diabetes Program Recommendations:    Insulin: Noted steroids decreased. If steroids are continued as ordered, please consider increasing Semglee to 25 units daily (based on 127.6 kg x 0.2 units) and meal coverage to Novolog 8 units TID with meals if patient eats at least 50% of meals.  Outpatient DM: May want to consider discharging patient on basal insulin given A1C 10.4% on 02/04/23 (10.8% on 07/05/22).

## 2023-02-10 DIAGNOSIS — R5383 Other fatigue: Secondary | ICD-10-CM | POA: Diagnosis not present

## 2023-02-10 DIAGNOSIS — G9340 Encephalopathy, unspecified: Secondary | ICD-10-CM | POA: Diagnosis not present

## 2023-02-10 DIAGNOSIS — E860 Dehydration: Secondary | ICD-10-CM | POA: Diagnosis not present

## 2023-02-10 LAB — GLUCOSE, CAPILLARY
Glucose-Capillary: 328 mg/dL — ABNORMAL HIGH (ref 70–99)
Glucose-Capillary: 338 mg/dL — ABNORMAL HIGH (ref 70–99)
Glucose-Capillary: 374 mg/dL — ABNORMAL HIGH (ref 70–99)

## 2023-02-10 MED ORDER — INSULIN GLARGINE-YFGN 100 UNIT/ML ~~LOC~~ SOLN
30.0000 [IU] | Freq: Every day | SUBCUTANEOUS | Status: DC
  Filled 2023-02-10: qty 0.3

## 2023-02-10 MED ORDER — INSULIN GLARGINE-YFGN 100 UNIT/ML ~~LOC~~ SOLN: 30.0000 [IU] | Freq: Every day | SUBCUTANEOUS | 11 refills | Status: DC

## 2023-02-10 MED ORDER — AMOXICILLIN-POT CLAVULANATE 875-125 MG PO TABS: 1.0000 | ORAL_TABLET | Freq: Two times a day (BID) | ORAL | 0 refills | Status: AC

## 2023-02-10 MED ORDER — INSULIN ASPART 100 UNIT/ML IJ SOLN: 10.0000 [IU] | Freq: Three times a day (TID) | INTRAMUSCULAR | 11 refills | Status: DC

## 2023-02-10 MED ORDER — INSULIN ASPART 100 UNIT/ML IJ SOLN: INTRAMUSCULAR | 11 refills | Status: DC

## 2023-02-10 MED ORDER — COLCHICINE 0.6 MG PO TABS: 0.3000 mg | ORAL_TABLET | Freq: Two times a day (BID) | ORAL | 0 refills | Status: DC

## 2023-02-10 MED ORDER — RISPERIDONE 0.5 MG PO TABS: 0.5000 mg | ORAL_TABLET | Freq: Every day | ORAL | 0 refills | Status: DC

## 2023-02-10 MED ORDER — INSULIN ASPART 100 UNIT/ML IJ SOLN
10.0000 [IU] | Freq: Three times a day (TID) | INTRAMUSCULAR | Status: DC
  Administered 2023-02-10: 10 [IU] via SUBCUTANEOUS
  Filled 2023-02-10 (×2): qty 1

## 2023-02-10 NOTE — Progress Notes (Signed)
MD order received in [facility] to discharge pt back to SNF/[facility]; report called to [phone] spoke to [facility]; pt's discharge pending arrival of EMS for nonemergency transport

## 2023-02-10 NOTE — Discharge Summary (Signed)
Physician Discharge Summary   Patient: [name] [name] MRN: [id number] DOB: April 23, 1946  Admit date:     02/04/2023  Discharge date: 02/10/23  Discharge Physician: Hosie Poisson   PCP: Hal Morales, DO   Recommendations at discharge:  Please follow up with PCP in one week.   Discharge Diagnoses: Principal Problem:   Lethargy Active Problems:   Cyanosis   Speech abnormality   Bradycardia   Esophageal thickening   Liver nodule   Cerebral infarct [facility])   Dehydration   Acute encephalopathy    Hospital Course: [name] [name] is a 77 y.o. male with medical history significant of early dementia,  was brought in for lethargy, and confusion.  Patient is wheelchair-bound and has not walked in several years and reports that he was feeling his usual self till about 2 weeks ago when he starts reporting a new onset of sensation of shortness of breath.  He is currently on 2 lit of [place] oxygen to keep sats greater than 90%. CT angio of the chest is negative for PE. CT head is negative for acute stroke. Venous duplex of the lower extremities is negative for acute DVT. VBG is unremarkable. Echocardiogram is unremarkable.   Assessment and Plan: Lethargy/ acute encephalopathy of unclear etiology.  CT head is negative for acute stroke. VBG is unremarkable.  TSH, vit B12 and ammonia levels are pending.  ? Early dementia- progressing vs polypharmacy. He is alert and appears to be back to baseline.  MRI brain without contrast, does not show stroke.    Dehydration Resolved.    Liver nodule Outpatient eval   Esophageal thickening Patient has prior history of esophageal strictures documented in the chart, this will need further evaluation as an outpatient.  On PPI .  Recommend outpatient follow up with PPI.    Bradycardia Sinus bradycardia.    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  We are awaiting speech/swallow evaluation in the ER  to screen out aspiration and then look forward to starting patient's diet.  CT head wnl.  MRI Brain is negative for stroke.      Hypothyroidism:  Tsh WNL. Marland Kitchen Resume synthroid 150 mcg daily.    Hyperlipidemia:  Continue with Lipitor 40 mg daily.      COPD:  No wheezing heard.  Continue with dulera and albuterol prn.  Weaned off oxygen and he is on RA currently.        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  CBG (last 3)  Recent Labs    02/09/23 1611 02/09/23 2043 02/10/23 0804  GLUCAP 382* 341* 328*   Resume home meds on discharge.  Increased semglee to 30 units along with 10 units of NOVOLOG AND SSI.  A1c is around 10.      Hypertension;  Optimal.  Currently on isosorbide dinitrate,      Chronic diastolic heart failure:  Euvolemic. Resume lasix 40 mg BID.  Echocardiogram unremarkable.      OSA   On CPAP at night.    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      Right elbow pain: ? Gout flare up.  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 Mild chronic enthesopathic change at the common flexor and common extensor tendon origins. Possible small elbow joint effusion. No definite acute fracture is seen.  Started on colchicine and prednisone.  Pain and swelling has improved.  Physical therapy. Uric acid levels wnl.  Ordered  CT elbow for further evaluation. It shows No acute osseous abnormality or significant joint effusion. Mild osteoarthritis of the elbow.  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 Subcutaneous soft tissue swelling along the posterior elbow and proximal forearm. Empirically started on him on oral augmentin for mild cellulitis on the arm.    Body mass index is 36.12 kg/m. Obesity.        Estimated body mass index is 36.12 kg/m as calculated from the following:   Height as of this encounter: 6\' 2"  (1.88 m).   Weight as of this encounter: 127.6 kg.     Consultants: none.  Procedures performed: MRI brian  Disposition: Skilled nursing facility Diet recommendation:  Discharge Diet Orders (From admission, onward)     Start     Ordered   02/10/23 0000  Diet - low sodium heart healthy        02/10/23 1056           Cardiac and Carb modified diet DISCHARGE MEDICATION: Allergies as of 02/10/2023   No Known Allergies      Medication List     STOP taking these medications    cyclobenzaprine 10 MG tablet Commonly known as: FLEXERIL   dicyclomine 10 MG capsule Commonly known as: BENTYL   diphenhydrAMINE 25 mg capsule Commonly known as: BENADRYL   HYDROcodone-acetaminophen 5-325 MG tablet Commonly known as: NORCO/VICODIN   insulin lispro 100 UNIT/ML KwikPen Commonly known as: HUMALOG       TAKE these medications    albuterol 108 (90 Base) MCG/ACT inhaler Commonly known as: VENTOLIN HFA Inhale 2 puffs into the lungs every 6 (six) hours as needed for wheezing or shortness of breath.   amoxicillin-clavulanate 875-125 MG tablet Commonly known as: AUGMENTIN Take 1 tablet by mouth 2 (two) times daily for 5 days.   atorvastatin 40 MG tablet Commonly known as: LIPITOR Take 1 tablet (40 mg total) by mouth daily.   Benzocaine-Menthol 15-3.6 MG Lozg Use as directed 1 lozenge in the mouth or throat every 4 (four) hours as needed (sore throat).   bisacodyl 10 MG suppository Commonly known as: DULCOLAX Place 10 mg rectally daily as needed for moderate constipation.   clopidogrel 75 MG tablet Commonly known as: PLAVIX Take 1 tablet (75 mg total) by mouth daily.   colchicine 0.6 MG tablet Take 0.5 tablets (0.3 mg total) by mouth 2 (two) times daily for 7 days.   donepezil 10 MG tablet Commonly known as: ARICEPT Take 10 mg by mouth at bedtime.   ergocalciferol 1.25 MG (50000 UT) capsule Commonly known as: VITAMIN D2 Take 50,000 Units by mouth every Wednesday. Take every Monday and Wednesday   Fish Oil 1000 MG  Caps Take 1 capsule by mouth daily.   FLUoxetine 40 MG capsule Commonly known as: PROZAC Take 40 mg by mouth daily.   furosemide 40 MG tablet Commonly known as: LASIX Take 40 mg by mouth 2 (two) times daily.   GenTeal Tears 0.1-0.2-0.3 % Soln Place 1 drop into both eyes in the morning, at noon, in the evening, and at bedtime.   hydrocortisone 2.5 % cream Apply 1 application topically every 12 (twelve) hours as needed (per rectum, hemorrhoids).   insulin aspart 100 UNIT/ML injection Commonly known as: novoLOG CBG 70 - 120: 0 units  CBG 121 - 150: 3 units  CBG 151 - 200: 4 units  CBG 201 - 250: 7 units  CBG 251 - 300: 11 units  CBG 301 - 350: 15 units  CBG 351 - 400: 20 units   insulin aspart 100 UNIT/ML injection Commonly known as: novoLOG Inject 10 Units into the skin 3 (three) times daily with meals.   insulin glargine-yfgn 100 UNIT/ML injection Commonly known as: SEMGLEE Inject 0.3 mLs (30 Units total) into the skin at bedtime.   isosorbide dinitrate 10 MG tablet Commonly known as: ISORDIL Take 1 tablet (10 mg total) by mouth 3 (three) times daily.   ketoconazole 2 % shampoo Commonly known as: NIZORAL Apply topically once a week. thursday   levothyroxine 150 MCG tablet Commonly known as: SYNTHROID Take 150 mcg by mouth daily. What changed: Another medication with the same name was removed. Continue taking this medication, and follow the directions you see here.   Linzess 290 MCG Caps capsule Generic drug: linaclotide Take 290 mcg by mouth daily.   magnesium oxide 400 MG tablet Commonly known as: MAG-OX Take 400 mg by mouth 2 (two) times daily.   midodrine 5 MG tablet Commonly known as: PROAMATINE Take 5 mg by mouth daily. Hold for SBP>100 or DBP >100   Mirvaso 0.33 % Gel Generic drug: Brimonidine Tartrate Apply 1 Application topically daily. Apply to face daily   nystatin powder Generic drug: nystatin Apply topically.   pantoprazole 40 MG  tablet Commonly known as: PROTONIX Take 1 tablet (40 mg total) by mouth daily.   polyethylene glycol 17 g packet Commonly known as: MIRALAX / GLYCOLAX Take 17 g by mouth daily. Mix one tablespoon with 8oz of your favorite juice or water every day until you are having soft formed stools. Then start taking once daily if you didn't have a stool the day before.   potassium chloride SA 20 MEQ tablet Commonly known as: KLOR-CON M Take 20 mEq by mouth 3 (three) times daily with meals.   pregabalin 300 MG capsule Commonly known as: LYRICA Take 300 mg by mouth 2 (two) times daily. What changed: Another medication with the same name was removed. Continue taking this medication, and follow the directions you see here.   risperiDONE 0.5 MG tablet Commonly known as: RISPERDAL Take 1 tablet (0.5 mg total) by mouth at bedtime. What changed:  medication strength how much to take when to take this   senna-docusate 8.6-50 MG tablet Commonly known as: Senokot-S Take 1 tablet by mouth 2 (two) times daily.   simethicone 125 MG chewable tablet Commonly known as: MYLICON Chew 0000000 mg by mouth 2 (two) times daily.   SitaGLIPtin-MetFORMIN HCl [phone] MG Tb24 Take 1 tablet by mouth daily.   traZODone 50 MG tablet Commonly known as: DESYREL Take 0.5 tablets (25 mg total) by mouth at bedtime as needed for sleep. What changed:  how much to take when to take this   Wixela Inhub 250-50 MCG/ACT Aepb Generic drug: fluticasone-salmeterol Inhale 1 puff into the lungs in the morning and at bedtime.               Discharge Care Instructions  (From admission, onward)           Start     Ordered   02/10/23 0000  Discharge wound care:       Comments: Foam dressing / discharge wound care as per wound RN instructions.   02/10/23 1056            Contact information for follow-up providers     Simpson-Tarokh, Leann, DO. Schedule an appointment as soon as possible for a visit in  1  week(s).   Specialty: Family Medicine Why: Facility to make follow up appt Contact information: [place] [address] [phone]              Contact information for after-discharge care     Destination     [facility] .   Service: Skilled Nursing Contact information: [address] [place] [phone]                    Discharge Exam: Danley Danker Weights   02/04/23 2027 02/06/23 1920  Weight: 115 kg 127.6 kg   General exam: Appears calm and comfortable  Respiratory system: Clear to auscultation. Respiratory effort normal. Cardiovascular system: S1 & S2 heard, RRR. No JVD,  Gastrointestinal system: Abdomen is nondistended, soft and nontender.  Central nervous system: Alert and oriented. No focal neurological deficits. Extremities: slight redness on the right forearm , improving.  Skin: no rashes.  Psychiatry: Mood & affect appropriate.    Condition at discharge: fair  The results of significant diagnostics from this hospitalization (including imaging, microbiology, ancillary and laboratory) are listed below for reference.   Imaging Studies: CT ELBOW RIGHT WO CONTRAST  Result Date: 02/09/2023 CLINICAL DATA:  elbow pain and swelling. EXAM: CT OF THE UPPER RIGHT EXTREMITY WITHOUT CONTRAST TECHNIQUE: Multidetector CT imaging of the upper right extremity was performed according to the standard protocol. RADIATION DOSE REDUCTION: This exam was performed according to the departmental dose-optimization program which includes automated exposure control, adjustment of the mA and/or kV according to patient size and/or use of iterative reconstruction technique. COMPARISON:  Right elbow radiograph 02/06/2023 FINDINGS: Bones/Joint/Cartilage There is no evidence of acute fracture. Alignment is normal. There is no significant joint effusion. There is mild ulnotrochlear degenerative change. Ligaments  Suboptimally assessed by CT. Muscles and Tendons No acute myotendinous abnormality by CT. There is enthesophyte formation at the medial and lateral epicondyles. Soft tissues There is subcutaneous soft tissue swelling along the posterior elbow and proximal forearm. IMPRESSION: No acute osseous abnormality or significant joint effusion. Mild osteoarthritis of the elbow. 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 Subcutaneous soft tissue swelling along the posterior elbow and proximal forearm. Electronically Signed   By: Maurine Simmering M.D.   On: 02/09/2023 09:10   DG Elbow 2 Views Right  Result Date: 02/06/2023 CLINICAL DATA:  Elbow pain and swelling for 2 weeks. EXAM: RIGHT ELBOW - 2 VIEW COMPARISON:  None Available. FINDINGS: There is possible elevation of the distal anterior humeral fat pad equivocal 4 a possible joint effusion. Mildly decreased bone mineralization. Minimal chronic enthesopathic spurring at the triceps insertion on the olecranon. Mild medial elbow joint space narrowing and peripheral degenerative spurring. Mild medial and lateral epicondyle chronic enthesopathic change at the common flexor and common extensor tendon origins, respectively. No acute fracture line is seen. IMPRESSION: 1. Mild medial elbow osteoarthritis. 2. Mild chronic enthesopathic change at the common flexor and common extensor tendon origins. 3. Possible small elbow joint effusion. No definite acute fracture is seen. Electronically Signed   By: Yvonne Kendall M.D.   On: 02/06/2023 16:03   MR BRAIN WO CONTRAST  Result Date: 02/05/2023 CLINICAL DATA:  Lethargy and confusion.  History of dementia EXAM: MRI HEAD WITHOUT CONTRAST TECHNIQUE: Multiplanar, multiecho pulse sequences of the brain and surrounding structures were obtained without intravenous contrast. COMPARISON:  Head CT from yesterday FINDINGS: Brain: No acute infarction, hemorrhage, hydrocephalus,  extra-axial  collection or mass lesion. Disproportionate subarachnoid spaces but no notable ventriculomegaly. Mild chronic small vessel ischemia in the cerebral [place] matter and pons. No abnormal mineralization Vascular: Major flow voids are preserved Skull and upper cervical spine: Normal marrow signal. Sinuses/Orbits: Partial bilateral mastoid opacification. Generalized mucosal thickening in the paranasal sinuses. Negative orbits. IMPRESSION: No acute or reversible finding. Electronically Signed   By: Jorje Guild M.D.   On: 02/05/2023 21:04   ECHOCARDIOGRAM COMPLETE  Result Date: 02/05/2023    ECHOCARDIOGRAM REPORT   Patient Name:   ADREIAN TACHE Mcmanigal Date of Exam: 02/05/2023 Medical Rec #:  [id number]     Height:       74.0 in Accession #:    [id number]    Weight:       253.5 lb Date of Birth:  1945-12-22      BSA:          2.404 m Patient Age:    21 years      BP:           159/93 mmHg Patient Gender: M             HR:           57 bpm. Exam Location:  [facility] Procedure: 2D Echo, Cardiac Doppler and Color Doppler Indications:     Cardiomegaly I51.7  History:         Patient has prior history of Echocardiogram examinations, most                  recent 06/29/2022. COPD; Risk Factors:Hypertension and Diabetes.  Sonographer:     Sherrie Sport Referring Phys:  [id number] [facility] GOEL Diagnosing Phys: Ida Rogue MD  Sonographer Comments: Technically difficult study due to poor echo windows, no apical window and no subcostal window. Image acquisition challenging due to COPD and Image acquisition challenging due to patient body habitus. IMPRESSIONS  1. Left ventricular ejection fraction, by estimation, is 60 to 65%. The left ventricle has normal function. The left ventricle has no regional wall motion abnormalities. There is mild left ventricular hypertrophy. Left ventricular diastolic parameters are indeterminate.  2. Right ventricular systolic function is normal. The right ventricular size is normal.  3. The mitral valve is normal in  structure. No evidence of mitral valve regurgitation. No evidence of mitral stenosis.  4. The aortic valve was not well visualized. Aortic valve regurgitation is not visualized. No aortic stenosis is present.  5. The inferior vena cava is normal in size with greater than 50% respiratory variability, suggesting right atrial pressure of 3 mmHg. FINDINGS  Left Ventricle: Left ventricular ejection fraction, by estimation, is 60 to 65%. The left ventricle has normal function. The left ventricle has no regional wall motion abnormalities. The left ventricular internal cavity size was normal in size. There is  mild left ventricular hypertrophy. Left ventricular diastolic parameters are indeterminate. Right Ventricle: The right ventricular size is normal. No increase in right ventricular wall thickness. Right ventricular systolic function is normal. Left Atrium: Left atrial size was normal in size. Right Atrium: Right atrial size was normal in size. Pericardium: There is no evidence of pericardial effusion. Mitral Valve: The mitral valve is normal in structure. No evidence of mitral valve regurgitation. No evidence of mitral valve stenosis. Tricuspid Valve: The tricuspid valve is normal in structure. Tricuspid valve regurgitation is not demonstrated. No evidence of tricuspid stenosis. Aortic Valve: The aortic valve was not well visualized. Aortic valve regurgitation is not visualized. No  aortic stenosis is present. Pulmonic Valve: The pulmonic valve was normal in structure. Pulmonic valve regurgitation is not visualized. No evidence of pulmonic stenosis. Aorta: The aortic root is normal in size and structure. Venous: The inferior vena cava is normal in size with greater than 50% respiratory variability, suggesting right atrial pressure of 3 mmHg. IAS/Shunts: No atrial level shunt detected by color flow Doppler.  LEFT VENTRICLE PLAX 2D LVIDd:         5.60 cm LVIDs:         3.20 cm LV PW:         1.30 cm LV IVS:        0.90 cm  LVOT diam:     2.20 cm LVOT Area:     3.80 cm  LEFT ATRIUM         Index LA diam:    1.60 cm 0.67 cm/m   AORTA Ao Root diam: 3.10 cm  SHUNTS Systemic Diam: 2.20 cm Ida Rogue MD Electronically signed by Ida Rogue MD Signature Date/Time: 02/05/2023/12:59:30 PM    Final    US Venous Img Lower Bilateral (DVT)  Result Date: 02/04/2023 CLINICAL DATA:  Bilateral leg and foot cramping. EXAM: BILATERAL LOWER EXTREMITY VENOUS DOPPLER ULTRASOUND TECHNIQUE: Gray-scale sonography with graded compression, as well as color Doppler and duplex ultrasound were performed to evaluate the lower extremity deep venous systems from the level of the common femoral vein and including the common femoral, femoral, profunda femoral, popliteal and calf veins including the posterior tibial, peroneal and gastrocnemius veins when visible. The superficial great saphenous vein was also interrogated. Spectral Doppler was utilized to evaluate flow at rest and with distal augmentation maneuvers in the common femoral, femoral and popliteal veins. COMPARISON:  None Available. FINDINGS: RIGHT LOWER EXTREMITY Common Femoral Vein: No evidence of thrombus. Normal compressibility, respiratory phasicity and response to augmentation. Saphenofemoral Junction: No evidence of thrombus. Normal compressibility and flow on color Doppler imaging. Profunda Femoral Vein: No evidence of thrombus. Normal compressibility and flow on color Doppler imaging. Femoral Vein: No evidence of thrombus. Normal compressibility, respiratory phasicity and response to augmentation. Popliteal Vein: No evidence of thrombus. Normal compressibility, respiratory phasicity and response to augmentation. Calf Veins: The RIGHT posterior tibial vein and RIGHT peroneal vein are not visualized. Superficial Great Saphenous Vein: No evidence of thrombus. Normal compressibility. Venous Reflux:  None. Other Findings:  None. LEFT LOWER EXTREMITY Common Femoral Vein: No evidence of  thrombus. Normal compressibility, respiratory phasicity and response to augmentation. Saphenofemoral Junction: No evidence of thrombus. Normal compressibility and flow on color Doppler imaging. Profunda Femoral Vein: No evidence of thrombus. Normal compressibility and flow on color Doppler imaging. Femoral Vein: No evidence of thrombus. Normal compressibility, respiratory phasicity and response to augmentation. Popliteal Vein: No evidence of thrombus. Normal compressibility, respiratory phasicity and response to augmentation. Calf Veins: The LEFT posterior tibial vein and LEFT peroneal vein are not visualized. Superficial Great Saphenous Vein: No evidence of thrombus. Normal compressibility. Venous Reflux:  None. Other Findings:  None. IMPRESSION: Limited visualization of the BILATERAL posterior tibial veins and BILATERAL peroneal veins, without evidence of deep venous thrombosis in either lower extremity. Electronically Signed   By: Virgina Norfolk M.D.   On: 02/04/2023 22:46   CT Angio Chest PE W and/or Wo Contrast  Result Date: 02/04/2023 CLINICAL DATA:  PE EXAM: CT ANGIOGRAPHY CHEST CT ABDOMEN AND PELVIS WITH CONTRAST TECHNIQUE: Multidetector CT imaging of the chest was performed using the standard protocol during bolus administration of intravenous  contrast. Multiplanar CT image reconstructions and MIPs were obtained to evaluate the vascular anatomy. Multidetector CT imaging of the abdomen and pelvis was performed using the standard protocol during bolus administration of intravenous contrast. RADIATION DOSE REDUCTION: This exam was performed according to the departmental dose-optimization program which includes automated exposure control, adjustment of the mA and/or kV according to patient size and/or use of iterative reconstruction technique. CONTRAST:  100 mL OMNIPAQUE IOHEXOL 350 MG/ML SOLN COMPARISON:  CT chest 04/05/2019, CT abdomen and pelvis 11/22/2022 FINDINGS: CTA CHEST FINDINGS Cardiovascular:  Cardiomegaly. No pericardial effusion. Dense atheromatous calcifications of the aorta and its branches. No filling defects are identified in pulmonary arteries to indicate PE. No aortic aneurysm or dissection identified. Mediastinum/Nodes: Diffuse esophageal wall thickening identified. Endoscopic correlation recommended non emergently to assess for possible esophageal mucosal abnormalities. No suspicious adenopathy. Thyroid is not identified. Airways are patent. Lungs/Pleura: Linear bibasilar subsegmental atelectasis or scarring. No pneumonia or pulmonary edema. No pneumothorax or pleural effusion. Musculoskeletal: Thoracolumbosacral degenerative disc disease. No chest wall abnormalities. Osseous structures are osteopenic. Review of the MIP images confirms the above findings. CT ABDOMEN and PELVIS FINDINGS Hepatobiliary: Subcentimeter liver in the medial left lobe is too small to characterize. No biliary ductal dilatation. No enhancing abnormalities. Unremarkable gallbladder. Pancreas: Unremarkable. No pancreatic ductal dilatation or surrounding inflammatory changes. Spleen: Normal in size without focal abnormality. Adrenals/Urinary Tract: 1.1 cm left adrenal nodule consistent with an adenoma is stable finding. Bilateral renal cysts identified, the largest 3 cm midpole right. No hydronephrosis or nephrolithiasis. Unremarkable urinary bladder. Stomach/Bowel: No bowel dilatation to suggest obstruction. Diverticulosis of the descending and sigmoid. No appendicitis. Appendix is not identified. No abscesses. Vascular/Lymphatic: Aortic atherosclerosis. No enlarged abdominal or pelvic lymph nodes. Reproductive: Prostate is diminutive. Other: No abdominal wall hernia or abnormality. No abdominopelvic ascites. Musculoskeletal: Thoracolumbosacral degenerative changes. Review of the MIP images confirms the above findings. IMPRESSION: 1. No PE. 2. Cardiomegaly. 3. Abnormal esophagus with diffuse wall thickening. This can be  evaluated endoscopically. 4. Numerous renal cysts. 5. Subcentimeter hypodensity liver, too small to characterize. 6. Diverticulosis. Electronically Signed   By: Sammie Bench M.D.   On: 02/04/2023 18:15   CT ABDOMEN PELVIS W CONTRAST  Result Date: 02/04/2023 CLINICAL DATA:  PE EXAM: CT ANGIOGRAPHY CHEST CT ABDOMEN AND PELVIS WITH CONTRAST TECHNIQUE: Multidetector CT imaging of the chest was performed using the standard protocol during bolus administration of intravenous contrast. Multiplanar CT image reconstructions and MIPs were obtained to evaluate the vascular anatomy. Multidetector CT imaging of the abdomen and pelvis was performed using the standard protocol during bolus administration of intravenous contrast. RADIATION DOSE REDUCTION: This exam was performed according to the departmental dose-optimization program which includes automated exposure control, adjustment of the mA and/or kV according to patient size and/or use of iterative reconstruction technique. CONTRAST:  100 mL OMNIPAQUE IOHEXOL 350 MG/ML SOLN COMPARISON:  CT chest 04/05/2019, CT abdomen and pelvis 11/22/2022 FINDINGS: CTA CHEST FINDINGS Cardiovascular: Cardiomegaly. No pericardial effusion. Dense atheromatous calcifications of the aorta and its branches. No filling defects are identified in pulmonary arteries to indicate PE. No aortic aneurysm or dissection identified. Mediastinum/Nodes: Diffuse esophageal wall thickening identified. Endoscopic correlation recommended non emergently to assess for possible esophageal mucosal abnormalities. No suspicious adenopathy. Thyroid is not identified. Airways are patent. Lungs/Pleura: Linear bibasilar subsegmental atelectasis or scarring. No pneumonia or pulmonary edema. No pneumothorax or pleural effusion. Musculoskeletal: Thoracolumbosacral degenerative disc disease. No chest wall abnormalities. Osseous structures are osteopenic. Review of the MIP images confirms the  above findings. CT ABDOMEN  and PELVIS FINDINGS Hepatobiliary: Subcentimeter liver in the medial left lobe is too small to characterize. No biliary ductal dilatation. No enhancing abnormalities. Unremarkable gallbladder. Pancreas: Unremarkable. No pancreatic ductal dilatation or surrounding inflammatory changes. Spleen: Normal in size without focal abnormality. Adrenals/Urinary Tract: 1.1 cm left adrenal nodule consistent with an adenoma is stable finding. Bilateral renal cysts identified, the largest 3 cm midpole right. No hydronephrosis or nephrolithiasis. Unremarkable urinary bladder. Stomach/Bowel: No bowel dilatation to suggest obstruction. Diverticulosis of the descending and sigmoid. No appendicitis. Appendix is not identified. No abscesses. Vascular/Lymphatic: Aortic atherosclerosis. No enlarged abdominal or pelvic lymph nodes. Reproductive: Prostate is diminutive. Other: No abdominal wall hernia or abnormality. No abdominopelvic ascites. Musculoskeletal: Thoracolumbosacral degenerative changes. Review of the MIP images confirms the above findings. IMPRESSION: 1. No PE. 2. Cardiomegaly. 3. Abnormal esophagus with diffuse wall thickening. This can be evaluated endoscopically. 4. Numerous renal cysts. 5. Subcentimeter hypodensity liver, too small to characterize. 6. Diverticulosis. Electronically Signed   By: Sammie Bench M.D.   On: 02/04/2023 18:15   DG Chest Portable 1 View  Result Date: 02/04/2023 CLINICAL DATA:  Hypoxia EXAM: PORTABLE CHEST 1 VIEW COMPARISON:  07/05/2022 FINDINGS: Mild cardiomegaly. Aortic atherosclerosis. The lungs are clear. No edema, collapse or effusion. IMPRESSION: No active disease. Mild cardiomegaly. Aortic atherosclerosis. Electronically Signed   By: Nelson Chimes M.D.   On: 02/04/2023 14:27   CT HEAD CODE STROKE WO CONTRAST  Result Date: 02/04/2023 CLINICAL DATA:  Code stroke.  Slurred speech, aphasia EXAM: CT HEAD WITHOUT CONTRAST TECHNIQUE: Contiguous axial images were obtained from the base of  the skull through the vertex without intravenous contrast. RADIATION DOSE REDUCTION: This exam was performed according to the departmental dose-optimization program which includes automated exposure control, adjustment of the mA and/or kV according to patient size and/or use of iterative reconstruction technique. COMPARISON:  MR head 07/05/2022 FINDINGS: Brain: There is no acute intracranial hemorrhage, extra-axial fluid collection, or acute infarct. Parenchymal volume is stable. The ventricles are stable in size. Gray-Hermans differentiation is preserved. The pituitary and suprasellar region are normal. There is no mass lesion. There is no mass effect or midline shift. Vascular: No hyperdense vessel or unexpected calcification. Skull: Normal. Negative for fracture or focal lesion. Sinuses/Orbits: There is mild mucosal thickening in the paranasal sinuses. Bilateral lens implants are in place. The globes and orbits are otherwise unremarkable. Other: None. ASPECTS [facility] Stroke Program Early CT Score) - Ganglionic level infarction (caudate, lentiform nuclei, internal capsule, insula, M1-M3 cortex): 7 - Supraganglionic infarction (M4-M6 cortex): 3 Total score (0-10 with 10 being normal): 10 IMPRESSION: No acute intracranial pathology. Findings communicated to Dr Cheral Marker via Shea Evans at 1:55 pm. Electronically Signed   By: Valetta Mole M.D.   On: 02/04/2023 13:56    Microbiology: Results for orders placed or performed during the hospital encounter of 02/04/23  Resp panel by RT-PCR (RSV, Flu A&B, Covid) Anterior Nasal Swab     Status: None   Collection Time: 02/04/23  2:25 PM   Specimen: Anterior Nasal Swab  Result Value Ref Range Status   SARS Coronavirus 2 by RT PCR NEGATIVE NEGATIVE Final    Comment: (NOTE) SARS-CoV-2 target nucleic acids are NOT DETECTED.  The SARS-CoV-2 RNA is generally detectable in upper respiratory specimens during the acute phase of infection. The lowest concentration of SARS-CoV-2  viral copies this assay can detect is 138 copies/mL. A negative result does not preclude SARS-Cov-2 infection and should not be used as the  sole basis for treatment or other patient management decisions. A negative result may occur with  improper specimen collection/handling, submission of specimen other than nasopharyngeal swab, presence of viral mutation(s) within the areas targeted by this assay, and inadequate number of viral copies(<138 copies/mL). A negative result must be combined with clinical observations, patient history, and epidemiological information. The expected result is Negative.  Fact Sheet for Patients:  EntrepreneurPulse.com.au  Fact Sheet for Healthcare Providers:  IncredibleEmployment.be  This test is no t yet approved or cleared by the [place] FDA and  has been authorized for detection and/or diagnosis of SARS-CoV-2 by FDA under an Emergency Use Authorization (EUA). This EUA will remain  in effect (meaning this test can be used) for the duration of the COVID-19 declaration under Section 564(b)(1) of the Act, 21 U.S.C.section 360bbb-3(b)(1), unless the authorization is terminated  or revoked sooner.       Influenza A by PCR NEGATIVE NEGATIVE Final   Influenza B by PCR NEGATIVE NEGATIVE Final    Comment: (NOTE) The Xpert Xpress SARS-CoV-2/FLU/RSV plus assay is intended as an aid in the diagnosis of influenza from Nasopharyngeal swab specimens and should not be used as a sole basis for treatment. Nasal washings and aspirates are unacceptable for Xpert Xpress SARS-CoV-2/FLU/RSV testing.  Fact Sheet for Patients: EntrepreneurPulse.com.au  Fact Sheet for Healthcare Providers: IncredibleEmployment.be  This test is not yet approved or cleared by the [place] FDA and has been authorized for detection and/or diagnosis of SARS-CoV-2 by FDA under an Emergency Use Authorization  (EUA). This EUA will remain in effect (meaning this test can be used) for the duration of the COVID-19 declaration under Section 564(b)(1) of the Act, 21 U.S.C. section 360bbb-3(b)(1), unless the authorization is terminated or revoked.     Resp Syncytial Virus by PCR NEGATIVE NEGATIVE Final    Comment: (NOTE) Fact Sheet for Patients: EntrepreneurPulse.com.au  Fact Sheet for Healthcare Providers: IncredibleEmployment.be  This test is not yet approved or cleared by the [place] FDA and has been authorized for detection and/or diagnosis of SARS-CoV-2 by FDA under an Emergency Use Authorization (EUA). This EUA will remain in effect (meaning this test can be used) for the duration of the COVID-19 declaration under Section 564(b)(1) of the Act, 21 U.S.C. section 360bbb-3(b)(1), unless the authorization is terminated or revoked.  Performed at [facility], [address] [place], [place] [address]     Labs: CBC: Recent Labs  Lab 02/04/23 1425 02/05/23 0500 02/06/23 0624 02/08/23 1052  WBC 7.0 8.8 8.3 8.9  NEUTROABS 5.2  --  6.3 7.3  HGB 13.8 13.8 13.3 13.0  HCT 43.2 42.4 41.3 39.9  MCV 86.7 86.9 87.1 86.4  PLT 155 144* 147* [id number]*   Basic Metabolic Panel: Recent Labs  Lab 02/04/23 1425 02/04/23 1615 02/05/23 0500 02/06/23 0624 02/07/23 2350 02/08/23 1052 02/08/23 2048 02/09/23 0430  NA 141  --  138 141  --  129*  --  135  K 3.9  --  3.8 4.1  --  3.1*  --  4.0  CL 102  --  103 105  --  100  --  96*  CO2 29  --  26 27  --  26  --  26  GLUCOSE 174*  --  168* 156* 528* 463* 507* 336*  BUN 19  --  22 21  --  20  --  23  CREATININE 1.19  --  1.04 1.07  --  1.15  --  0.92  CALCIUM 9.2  --  9.2 9.9  --  8.9  --  9.3  MG  --  1.8  --   --   --   --   --   --    Liver Function Tests: Recent Labs  Lab 02/04/23 1425 02/06/23 0624  AST 20 19  ALT 25 24  ALKPHOS 104 107  BILITOT 1.3* 1.7*  PROT 7.1 6.9  ALBUMIN 3.5  3.5   CBG: Recent Labs  Lab 02/09/23 1130 02/09/23 1152 02/09/23 1611 02/09/23 2043 02/10/23 0804  GLUCAP 410* 397* 382* 341* 328*    Discharge time spent: 40 minutes.   Signed: Hosie Poisson, MD [facility] 02/10/2023

## 2023-02-10 NOTE — Inpatient Diabetes Management (Signed)
Inpatient Diabetes Program Recommendations  AACE/ADA: New Consensus Statement on Inpatient Glycemic Control  Target Ranges:  Prepandial:   less than 140 mg/dL      Peak postprandial:   less than 180 mg/dL (1-2 hours)      Critically ill patients:  140 - 180 mg/dL    Latest Reference Range & Units 02/09/23 07:34 02/09/23 11:30 02/09/23 11:52 02/09/23 16:11 02/09/23 20:43 02/10/23 08:04  Glucose-Capillary 70 - 99 mg/dL 317 (H) 410 (H) 397 (H) 382 (H) 341 (H) 328 (H)   Review of Glycemic Control  Diabetes history: DM2 Outpatient Diabetes medications: Humalog correction TID, Sitagliptin-Metformin [phone] mg daily Current orders for Inpatient glycemic control: Semglee 25 units daily, Novolog 0-20 units AC&HS, Novolog 8 units TID with meals, Tradjenta 5 mg daily   Inpatient Diabetes Program Recommendations:     Insulin: Noted steroids discontinued. Please consider increasing Semglee to 30 units daily and meal coverage to Novolog 12 units TID with meals if patient eats at least 50% of meals.   Outpatient DM: Please consider discharging patient on basal insulin insulin given A1C 10.4% on 02/04/23 (10.8% on 07/05/22).    Thanks, Barnie Alderman, RN, MSN, [address] Diabetes Coordinator Inpatient Diabetes Program [phone] (Team Pager from 8am to [place])

## 2023-02-10 NOTE — Plan of Care (Signed)
Problem: Education: Goal: Knowledge of General Education information will improve Description: Including pain rating scale, medication(s)/side effects and non-pharmacologic comfort measures Outcome: Adequate for Discharge   Problem: Health Behavior/Discharge Planning: Goal: Ability to manage health-related needs will improve Outcome: Adequate for Discharge   Problem: Clinical Measurements: Goal: Ability to maintain clinical measurements within normal limits will improve Outcome: Adequate for Discharge Goal: Will remain free from infection Outcome: Adequate for Discharge Goal: Diagnostic test results will improve Outcome: Adequate for Discharge Goal: Respiratory complications will improve Outcome: Adequate for Discharge Goal: Cardiovascular complication will be avoided Outcome: Adequate for Discharge   Problem: Activity: Goal: Risk for activity intolerance will decrease Outcome: Adequate for Discharge   Problem: Nutrition: Goal: Adequate nutrition will be maintained Outcome: Adequate for Discharge   Problem: Coping: Goal: Level of anxiety will decrease Outcome: Adequate for Discharge   Problem: Elimination: Goal: Will not experience complications related to bowel motility Outcome: Adequate for Discharge Goal: Will not experience complications related to urinary retention Outcome: Adequate for Discharge   Problem: Pain Managment: Goal: General experience of comfort will improve Outcome: Adequate for Discharge   Problem: Safety: Goal: Ability to remain free from injury will improve Outcome: Adequate for Discharge   Problem: Skin Integrity: Goal: Risk for impaired skin integrity will decrease Outcome: Adequate for Discharge   Problem: Education: Goal: Knowledge of disease or condition will improve Outcome: Adequate for Discharge Goal: Knowledge of secondary prevention will improve (MUST DOCUMENT ALL) Outcome: Adequate for Discharge Goal: Knowledge of patient  specific risk factors will improve Elta [place] N/A or DELETE if not current risk factor) Outcome: Adequate for Discharge   Problem: Ischemic Stroke/TIA Tissue Perfusion: Goal: Complications of ischemic stroke/TIA will be minimized Outcome: Adequate for Discharge   Problem: Coping: Goal: Will verbalize positive feelings about self Outcome: Adequate for Discharge Goal: Will identify appropriate support needs Outcome: Adequate for Discharge   Problem: Health Behavior/Discharge Planning: Goal: Ability to manage health-related needs will improve Outcome: Adequate for Discharge Goal: Goals will be collaboratively established with patient/family Outcome: Adequate for Discharge   Problem: Self-Care: Goal: Ability to participate in self-care as condition permits will improve Outcome: Adequate for Discharge Goal: Verbalization of feelings and concerns over difficulty with self-care will improve Outcome: Adequate for Discharge Goal: Ability to communicate needs accurately will improve Outcome: Adequate for Discharge   Problem: Nutrition: Goal: Risk of aspiration will decrease Outcome: Adequate for Discharge   Problem: Education: Goal: Ability to describe self-care measures that may prevent or decrease complications (Diabetes Survival Skills Education) will improve Outcome: Adequate for Discharge Goal: Individualized Educational Video(s) Outcome: Adequate for Discharge   Problem: Coping: Goal: Ability to adjust to condition or change in health will improve Outcome: Adequate for Discharge   Problem: Fluid Volume: Goal: Ability to maintain a balanced intake and output will improve Outcome: Adequate for Discharge   Problem: Health Behavior/Discharge Planning: Goal: Ability to identify and utilize available resources and services will improve Outcome: Adequate for Discharge Goal: Ability to manage health-related needs will improve Outcome: Adequate for Discharge   Problem:  Metabolic: Goal: Ability to maintain appropriate glucose levels will improve Outcome: Adequate for Discharge   Problem: Nutritional: Goal: Maintenance of adequate nutrition will improve Outcome: Adequate for Discharge Goal: Progress toward achieving an optimal weight will improve Outcome: Adequate for Discharge   Problem: Skin Integrity: Goal: Risk for impaired skin integrity will decrease Outcome: Adequate for Discharge   Problem: Tissue Perfusion: Goal: Adequacy of tissue perfusion will improve Outcome: Adequate for Discharge

## 2023-02-10 NOTE — Progress Notes (Signed)
EMS present for pt discharge; discharge envelope given to EMS personnel to take to [facility]; pt discharged via stretcher by EMS to [facility]

## 2023-02-10 NOTE — TOC Transition Note (Signed)
Transition of Care [facility]) - CM/SW Discharge Note   Patient Details  Name: [name] [name] MRN: [id number] Date of Birth: 06/16/1946  Transition of Care [facility]) CM/SW Contact:  Gerilyn Pilgrim, LCSW Phone Number: 02/10/2023, 1:19 PM   Clinical Narrative:   Pt discharging to [facility]. WOM notified. [facility] called, pt third on the list. DC summary sent.       Barriers to Discharge: Continued Medical Work up   Patient Goals and CMS Choice   Choice offered to / list presented to : NA  Discharge Placement                         Discharge Plan and Services Additional resources added to the After Visit Summary for       Post Acute Care Choice: Resumption of Svcs/PTA Provider                               Social Determinants of Health (SDOH) Interventions SDOH Screenings   Tobacco Use: Medium Risk (02/05/2023)     Readmission Risk Interventions     No data to display

## 2023-02-10 NOTE — NC FL2 (Signed)
[place] LEVEL OF CARE FORM     IDENTIFICATION  Patient Name: [name] [name] Birthdate: 1946/09/06 Sex: male Admission Date (Current Location): 02/04/2023  [place] and [place] Number:  Engineering geologist and Address:  [facility], [address], [place], [place] [address]      Provider Number: [id number]  Attending Physician Name and Address:  Hosie Poisson, MD  Relative Name and Phone Number:       Current Level of Care: Hospital Recommended Level of Care: [place] Prior Approval Number:    Date Approved/Denied:   PASRR Number: [id number]  Discharge Plan: SNF    Current Diagnoses: Patient Active Problem List   Diagnosis Date Noted   Acute encephalopathy 02/05/2023   Lethargy 02/04/2023   Cyanosis 02/04/2023   Speech abnormality 02/04/2023   Bradycardia 02/04/2023   Esophageal thickening 02/04/2023   Liver nodule 02/04/2023   Cerebral infarct ([place]) 02/04/2023   Dehydration 02/04/2023   TIA (transient ischemic attack) 07/05/2022   Abnormal MRI of head 07/05/2022   Uncontrolled diabetes mellitus with hyperglycemia, without long-term current use of insulin ([place])    Morbid obesity ([place])    Cervical myelopathy with history of cervical discectomy and fusion ([place])    Esophageal dysphagia 11/18/2020   Elevated BUN 11/18/2020   AKI (acute kidney injury) ([place]) 11/18/2020   HCAP (healthcare-associated pneumonia)    Mucus plugging of bronchi    Left cervical radiculopathy 10/26/2020   Acute cervical radiculopathy 10/26/2020   Pressure injury of skin 10/26/2020   Problems with swallowing and mastication    Stricture and stenosis of esophagus    Hx of colonic polyps    Polyp of ascending colon    Bilateral carpal tunnel syndrome 02/13/2020   Neurocardiogenic syncope 03/02/2019   Lymphedema 02/04/2019   Tobacco use 02/03/2019   CHF (congestive heart failure) ([place]) 12/23/2018   Chronic diastolic CHF (congestive  heart failure) ([place]) 11/27/2018   Cellulitis 11/27/2018   Hypothyroidism 11/27/2018   OSA (obstructive sleep apnea) 11/27/2018   Hypertension 11/27/2018   Mixed hyperlipidemia 11/27/2018   Protein-calorie malnutrition ([place]) 03/02/2018   Neuropathy 03/02/2018   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   Difficulty in voiding 03/01/2018   Vitamin D deficiency 03/01/2018   Vitamin B12 deficiency 03/01/2018   Insomnia 03/01/2018   COPD (chronic obstructive pulmonary disease) ([place]) 02/11/2018   Pain due to onychomycosis of nail 02/11/2018   Onychomycosis 02/11/2018   Traumatic ecchymosis of foot, right, initial encounter 02/11/2018   Xerostomia 02/11/2018   Frequent falls 02/11/2018   Acute respiratory failure ([place]) 02/07/2018   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   History of thyroid cancer 05/05/2014    Orientation RESPIRATION BLADDER Height & Weight     Self, Time, Place  Normal Incontinent, External catheter Weight: 281 lb 4.9 oz (127.6 kg) Height:  6\' 2"  (188 cm)  BEHAVIORAL SYMPTOMS/MOOD NEUROLOGICAL BOWEL NUTRITION STATUS   (None)  (None) Continent Diet (Regular. Please CUT MEATS and add gravy; chop spaghetti too.  NO STRAWS!!!! Cream soups.)  AMBULATORY STATUS COMMUNICATION OF NEEDS Skin   Extensive Assist Verbally Skin abrasions, Bruising, Other (Comment), PU Stage and Appropriate Care (Erythema/redness, scratch marks.)   PU Stage 2 Dressing:  (Left buttocks: Foam.)                   Personal Care Assistance Level of Assistance  Bathing, Feeding, Dressing Bathing Assistance: Maximum assistance Feeding assistance: Limited assistance Dressing Assistance: Maximum assistance  Functional Limitations Info  Sight, Hearing, Speech Sight Info: Adequate Hearing Info: Adequate Speech Info: Adequate (Slurred at times)    SPECIAL CARE FACTORS FREQUENCY  PT (By licensed PT), OT (By licensed OT)     PT Frequency: 5 x week OT Frequency: 5 x week             Contractures Contractures Info: Not present    Additional Factors Info  Code Status, Allergies Code Status Info: Full code Allergies Info: NKDA           Current Medications (02/10/2023):  This is the current hospital active medication list Current Facility-Administered Medications  Medication Dose Route Frequency Provider Last Rate Last Admin   acetaminophen (TYLENOL) tablet 650 mg  650 mg Oral Q6H PRN Gertie Fey, MD       Or   acetaminophen (TYLENOL) suppository 650 mg  650 mg Rectal Q6H PRN Gertie Fey, MD       albuterol (PROVENTIL) (2.5 MG/3ML) 0.083% nebulizer solution 3 mL  3 mL Inhalation Q6H PRN Gertie Fey, MD       atorvastatin (LIPITOR) tablet 40 mg  40 mg Oral Daily Gertie Fey, MD   40 mg at 02/10/23 1019   bisacodyl (DULCOLAX) suppository 10 mg  10 mg Rectal Daily PRN Gertie Fey, MD       clopidogrel (PLAVIX) tablet 75 mg  75 mg Oral Daily Gertie Fey, MD   75 mg at 02/10/23 1018   colchicine tablet 0.3 mg  0.3 mg Oral BID Hosie Poisson, MD   0.3 mg at 02/10/23 1021   diphenhydrAMINE (BENADRYL) capsule 25 mg  25 mg Oral Q6H PRN Gertie Fey, MD   25 mg at 02/06/23 1426   donepezil (ARICEPT) tablet 10 mg  10 mg Oral QHS Gertie Fey, MD   10 mg at 02/09/23 2049   enoxaparin (LOVENOX) injection 57.5 mg  0.5 mg/kg Subcutaneous Q24H Gertie Fey, MD   57.5 mg at 02/09/23 2047   FLUoxetine (PROZAC) capsule 40 mg  40 mg Oral Daily Gertie Fey, MD   40 mg at 02/10/23 1018   furosemide (LASIX) tablet 40 mg  40 mg Oral BID Gertie Fey, MD   40 mg at 02/10/23 1018   insulin aspart (novoLOG) injection 0-20 Units  0-20 Units Subcutaneous TID AC & HS Sharion Settler, NP   15 Units at 02/10/23 1015   insulin aspart (novoLOG) injection 10 Units  10 Units Subcutaneous TID WC Hosie Poisson, MD       insulin glargine-yfgn (SEMGLEE) injection 30 Units  30 Units Subcutaneous QHS Hosie Poisson, MD       isosorbide dinitrate (ISORDIL) tablet 10 mg  10 mg Oral TID Gertie Fey, MD   10  mg at 02/10/23 1020   levothyroxine (SYNTHROID) tablet 150 mcg  150 mcg Oral Daily Gertie Fey, MD   150 mcg at 02/10/23 0532   linaclotide (LINZESS) capsule 290 mcg  290 mcg Oral Daily Gertie Fey, MD   290 mcg at 02/09/23 0829   linagliptin (TRADJENTA) tablet 5 mg  5 mg Oral Daily Chinita Greenland A, RPH   5 mg at 02/10/23 1023   magnesium oxide (MAG-OX) tablet 400 mg  400 mg Oral BID Gertie Fey, MD   400 mg at 02/10/23 1018   menthol-cetylpyridinium (CEPACOL) lozenge 3 mg  1 lozenge Oral PRN Chinita Greenland A, RPH       midodrine (PROAMATINE) tablet 5 mg  5 mg Oral Daily Gertie Fey, MD  5 mg at 02/10/23 1019   mometasone-formoterol (DULERA) 200-5 MCG/ACT inhaler 2 puff  2 puff Inhalation BID Gertie Fey, MD   2 puff at 02/10/23 1017   omega-3 acid ethyl esters (LOVAZA) capsule 1 g  1 g Oral Daily Gertie Fey, MD   1 g at 02/10/23 1019   pantoprazole (PROTONIX) EC tablet 40 mg  40 mg Oral Daily Gertie Fey, MD   40 mg at 02/10/23 1019   polyethylene glycol (MIRALAX / GLYCOLAX) packet 17 g  17 g Oral Daily PRN Hosie Poisson, MD       polyvinyl alcohol (LIQUIFILM TEARS) 1.4 % ophthalmic solution 1 drop  1 drop Both Eyes QID Noralee Space, RPH   1 drop at 02/10/23 1021   pregabalin (LYRICA) capsule 300 mg  300 mg Oral BID Gertie Fey, MD   300 mg at 02/10/23 1019   risperiDONE (RISPERDAL) tablet 0.5 mg  0.5 mg Oral QHS Hosie Poisson, MD   0.5 mg at 02/09/23 2046   senna-docusate (Senokot-S) tablet 1 tablet  1 tablet Oral QHS PRN Hosie Poisson, MD       simethicone (MYLICON) chewable tablet 80 mg  80 mg Oral BID Gertie Fey, MD   80 mg at 02/10/23 1020   sodium chloride flush (NS) 0.9 % injection 3 mL  3 mL Intravenous Once Gertie Fey, MD       sodium chloride flush (NS) 0.9 % injection 3 mL  3 mL Intravenous Q12H Gertie Fey, MD   3 mL at 02/10/23 1024   traMADol (ULTRAM) tablet 50 mg  50 mg Oral Q12H PRN Hosie Poisson, MD   50 mg at 02/08/23 2224   traZODone (DESYREL) tablet 25 mg  25 mg  Oral QHS PRN Gertie Fey, MD   25 mg at 02/09/23 2046     Discharge Medications:  Medication List       STOP taking these medications     cyclobenzaprine 10 MG tablet Commonly known as: FLEXERIL    dicyclomine 10 MG capsule Commonly known as: BENTYL    diphenhydrAMINE 25 mg capsule Commonly known as: BENADRYL    HYDROcodone-acetaminophen 5-325 MG tablet Commonly known as: NORCO/VICODIN    insulin lispro 100 UNIT/ML KwikPen Commonly known as: HUMALOG           TAKE these medications     albuterol 108 (90 Base) MCG/ACT inhaler Commonly known as: VENTOLIN HFA Inhale 2 puffs into the lungs every 6 (six) hours as needed for wheezing or shortness of breath.    amoxicillin-clavulanate 875-125 MG tablet Commonly known as: AUGMENTIN Take 1 tablet by mouth 2 (two) times daily for 5 days.    atorvastatin 40 MG tablet Commonly known as: LIPITOR Take 1 tablet (40 mg total) by mouth daily.    Benzocaine-Menthol 15-3.6 MG Lozg Use as directed 1 lozenge in the mouth or throat every 4 (four) hours as needed (sore throat).    bisacodyl 10 MG suppository Commonly known as: DULCOLAX Place 10 mg rectally daily as needed for moderate constipation.    clopidogrel 75 MG tablet Commonly known as: PLAVIX Take 1 tablet (75 mg total) by mouth daily.    colchicine 0.6 MG tablet Take 0.5 tablets (0.3 mg total) by mouth 2 (two) times daily for 7 days.    donepezil 10 MG tablet Commonly known as: ARICEPT Take 10 mg by mouth at bedtime.    ergocalciferol 1.25 MG (50000 UT) capsule Commonly known as: VITAMIN D2 Take 50,000 Units  by mouth every Wednesday. Take every Monday and Wednesday    Fish Oil 1000 MG Caps Take 1 capsule by mouth daily.    FLUoxetine 40 MG capsule Commonly known as: PROZAC Take 40 mg by mouth daily.    furosemide 40 MG tablet Commonly known as: LASIX Take 40 mg by mouth 2 (two) times daily.    GenTeal Tears 0.1-0.2-0.3 % Soln Place 1 drop into both eyes  in the morning, at noon, in the evening, and at bedtime.    hydrocortisone 2.5 % cream Apply 1 application topically every 12 (twelve) hours as needed (per rectum, hemorrhoids).    insulin aspart 100 UNIT/ML injection Commonly known as: novoLOG CBG 70 - 120: 0 units  CBG 121 - 150: 3 units  CBG 151 - 200: 4 units  CBG 201 - 250: 7 units  CBG 251 - 300: 11 units  CBG 301 - 350: 15 units  CBG 351 - 400: 20 units    insulin aspart 100 UNIT/ML injection Commonly known as: novoLOG Inject 10 Units into the skin 3 (three) times daily with meals.    insulin glargine-yfgn 100 UNIT/ML injection Commonly known as: SEMGLEE Inject 0.3 mLs (30 Units total) into the skin at bedtime.    isosorbide dinitrate 10 MG tablet Commonly known as: ISORDIL Take 1 tablet (10 mg total) by mouth 3 (three) times daily.    ketoconazole 2 % shampoo Commonly known as: NIZORAL Apply topically once a week. thursday    levothyroxine 150 MCG tablet Commonly known as: SYNTHROID Take 150 mcg by mouth daily. What changed: Another medication with the same name was removed. Continue taking this medication, and follow the directions you see here.    Linzess 290 MCG Caps capsule Generic drug: linaclotide Take 290 mcg by mouth daily.    magnesium oxide 400 MG tablet Commonly known as: MAG-OX Take 400 mg by mouth 2 (two) times daily.    midodrine 5 MG tablet Commonly known as: PROAMATINE Take 5 mg by mouth daily. Hold for SBP>100 or DBP >100    Mirvaso 0.33 % Gel Generic drug: Brimonidine Tartrate Apply 1 Application topically daily. Apply to face daily    nystatin powder Generic drug: nystatin Apply topically.    pantoprazole 40 MG tablet Commonly known as: PROTONIX Take 1 tablet (40 mg total) by mouth daily.    polyethylene glycol 17 g packet Commonly known as: MIRALAX / GLYCOLAX Take 17 g by mouth daily. Mix one tablespoon with 8oz of your favorite juice or water every day until you are having  soft formed stools. Then start taking once daily if you didn't have a stool the day before.    potassium chloride SA 20 MEQ tablet Commonly known as: KLOR-CON M Take 20 mEq by mouth 3 (three) times daily with meals.    pregabalin 300 MG capsule Commonly known as: LYRICA Take 300 mg by mouth 2 (two) times daily. What changed: Another medication with the same name was removed. Continue taking this medication, and follow the directions you see here.    risperiDONE 0.5 MG tablet Commonly known as: RISPERDAL Take 1 tablet (0.5 mg total) by mouth at bedtime. What changed:  medication strength how much to take when to take this    senna-docusate 8.6-50 MG tablet Commonly known as: Senokot-S Take 1 tablet by mouth 2 (two) times daily.    simethicone 125 MG chewable tablet Commonly known as: MYLICON Chew 0000000 mg by mouth 2 (two) times daily.  SitaGLIPtin-MetFORMIN HCl [phone] MG Tb24 Take 1 tablet by mouth daily.    traZODone 50 MG tablet Commonly known as: DESYREL Take 0.5 tablets (25 mg total) by mouth at bedtime as needed for sleep. What changed:  how much to take when to take this    Wixela Inhub 250-50 MCG/ACT Aepb Generic drug: fluticasone-salmeterol Inhale 1 puff into the lungs in the morning and at bedtime.        Please see discharge summary for a list of discharge medications.  Relevant Imaging Results:  Relevant Lab Results:   Additional Information SS#: [id number]  Gerilyn Pilgrim, LCSW

## 2023-02-25 NOTE — Progress Notes (Signed)
Toxic encephalopathy due to hydrocodone, flexiril and benadryl.

## 2023-03-18 ENCOUNTER — Other Ambulatory Visit: Payer: Self-pay

## 2023-03-18 ENCOUNTER — Emergency Department: Payer: Medicare Other

## 2023-03-18 ENCOUNTER — Inpatient Hospital Stay
Admission: EM | Admit: 2023-03-18 | Discharge: 2023-03-24 | DRG: 917 | Disposition: A | Discharge status: Skilled Nursing Facility | Payer: Medicare Other | Source: Skilled Nursing Facility | Attending: Student | Admitting: Student

## 2023-03-18 DIAGNOSIS — G894 Chronic pain syndrome: Secondary | ICD-10-CM | POA: Diagnosis present

## 2023-03-18 DIAGNOSIS — Z794 Long term (current) use of insulin: Secondary | ICD-10-CM

## 2023-03-18 DIAGNOSIS — E1140 Type 2 diabetes mellitus with diabetic neuropathy, unspecified: Secondary | ICD-10-CM | POA: Diagnosis present

## 2023-03-18 DIAGNOSIS — F419 Anxiety disorder, unspecified: Secondary | ICD-10-CM | POA: Diagnosis present

## 2023-03-18 DIAGNOSIS — E1165 Type 2 diabetes mellitus with hyperglycemia: Secondary | ICD-10-CM | POA: Diagnosis present

## 2023-03-18 DIAGNOSIS — Z825 Family history of asthma and other chronic lower respiratory diseases: Secondary | ICD-10-CM

## 2023-03-18 DIAGNOSIS — E890 Postprocedural hypothyroidism: Secondary | ICD-10-CM | POA: Diagnosis present

## 2023-03-18 DIAGNOSIS — R4182 Altered mental status, unspecified: Secondary | ICD-10-CM | POA: Insufficient documentation

## 2023-03-18 DIAGNOSIS — R404 Transient alteration of awareness: Secondary | ICD-10-CM | POA: Diagnosis not present

## 2023-03-18 DIAGNOSIS — J449 Chronic obstructive pulmonary disease, unspecified: Secondary | ICD-10-CM | POA: Diagnosis present

## 2023-03-18 DIAGNOSIS — Z818 Family history of other mental and behavioral disorders: Secondary | ICD-10-CM

## 2023-03-18 DIAGNOSIS — E7800 Pure hypercholesterolemia, unspecified: Secondary | ICD-10-CM | POA: Diagnosis present

## 2023-03-18 DIAGNOSIS — I4510 Unspecified right bundle-branch block: Secondary | ICD-10-CM | POA: Diagnosis present

## 2023-03-18 DIAGNOSIS — E669 Obesity, unspecified: Secondary | ICD-10-CM | POA: Diagnosis present

## 2023-03-18 DIAGNOSIS — Z8546 Personal history of malignant neoplasm of prostate: Secondary | ICD-10-CM

## 2023-03-18 DIAGNOSIS — Z79899 Other long term (current) drug therapy: Secondary | ICD-10-CM

## 2023-03-18 DIAGNOSIS — R7989 Other specified abnormal findings of blood chemistry: Secondary | ICD-10-CM

## 2023-03-18 DIAGNOSIS — Z79890 Hormone replacement therapy (postmenopausal): Secondary | ICD-10-CM

## 2023-03-18 DIAGNOSIS — Z8249 Family history of ischemic heart disease and other diseases of the circulatory system: Secondary | ICD-10-CM

## 2023-03-18 DIAGNOSIS — F32A Depression, unspecified: Secondary | ICD-10-CM | POA: Diagnosis present

## 2023-03-18 DIAGNOSIS — Z823 Family history of stroke: Secondary | ICD-10-CM

## 2023-03-18 DIAGNOSIS — G4733 Obstructive sleep apnea (adult) (pediatric): Secondary | ICD-10-CM | POA: Diagnosis present

## 2023-03-18 DIAGNOSIS — N179 Acute kidney failure, unspecified: Secondary | ICD-10-CM | POA: Diagnosis present

## 2023-03-18 DIAGNOSIS — K219 Gastro-esophageal reflux disease without esophagitis: Secondary | ICD-10-CM | POA: Diagnosis present

## 2023-03-18 DIAGNOSIS — Z6836 Body mass index (BMI) 36.0-36.9, adult: Secondary | ICD-10-CM

## 2023-03-18 DIAGNOSIS — Z87891 Personal history of nicotine dependence: Secondary | ICD-10-CM

## 2023-03-18 DIAGNOSIS — I5032 Chronic diastolic (congestive) heart failure: Secondary | ICD-10-CM | POA: Diagnosis present

## 2023-03-18 DIAGNOSIS — Z8719 Personal history of other diseases of the digestive system: Secondary | ICD-10-CM

## 2023-03-18 DIAGNOSIS — T50911A Poisoning by multiple unspecified drugs, medicaments and biological substances, accidental (unintentional), initial encounter: Secondary | ICD-10-CM | POA: Diagnosis not present

## 2023-03-18 DIAGNOSIS — F0390 Unspecified dementia without behavioral disturbance: Secondary | ICD-10-CM | POA: Diagnosis present

## 2023-03-18 DIAGNOSIS — G9340 Encephalopathy, unspecified: Secondary | ICD-10-CM

## 2023-03-18 DIAGNOSIS — Z7902 Long term (current) use of antithrombotics/antiplatelets: Secondary | ICD-10-CM

## 2023-03-18 DIAGNOSIS — Z85850 Personal history of malignant neoplasm of thyroid: Secondary | ICD-10-CM

## 2023-03-18 DIAGNOSIS — G928 Other toxic encephalopathy: Secondary | ICD-10-CM | POA: Diagnosis present

## 2023-03-18 DIAGNOSIS — I110 Hypertensive heart disease with heart failure: Secondary | ICD-10-CM | POA: Diagnosis present

## 2023-03-18 DIAGNOSIS — E559 Vitamin D deficiency, unspecified: Secondary | ICD-10-CM | POA: Diagnosis present

## 2023-03-18 LAB — COMPREHENSIVE METABOLIC PANEL
ALT: 22 U/L (ref 0–44)
AST: 23 U/L (ref 15–41)
Albumin: 3.2 g/dL — ABNORMAL LOW (ref 3.5–5.0)
Alkaline Phosphatase: 104 U/L (ref 38–126)
Anion gap: 9 (ref 5–15)
BUN: 20 mg/dL (ref 8–23)
CO2: 28 mmol/L (ref 22–32)
Calcium: 9.1 mg/dL (ref 8.9–10.3)
Chloride: 104 mmol/L (ref 98–111)
Creatinine, Ser: 1.25 mg/dL — ABNORMAL HIGH (ref 0.61–1.24)
GFR, Estimated: 59 mL/min — ABNORMAL LOW (ref >60)
Glucose, Bld: 124 mg/dL — ABNORMAL HIGH (ref 70–99)
Potassium: 4 mmol/L (ref 3.5–5.1)
Sodium: 141 mmol/L (ref 135–145)
Total Bilirubin: 1 mg/dL (ref 0.3–1.2)
Total Protein: 6.9 g/dL (ref 6.5–8.1)

## 2023-03-18 LAB — CBC WITH DIFFERENTIAL/PLATELET
Abs Immature Granulocytes: 0.06 10*3/uL (ref 0.00–0.07)
Basophils Absolute: 0.1 10*3/uL (ref 0.0–0.1)
Basophils Relative: 1 %
Eosinophils Absolute: 0.2 10*3/uL (ref 0.0–0.5)
Eosinophils Relative: 2 %
HCT: 43.8 % (ref 39.0–52.0)
Hemoglobin: 13.9 g/dL (ref 13.0–17.0)
Immature Granulocytes: 1 %
Lymphocytes Relative: 12 %
Lymphs Abs: 1.1 10*3/uL (ref 0.7–4.0)
MCH: 27.9 pg (ref 26.0–34.0)
MCHC: 31.7 g/dL (ref 30.0–36.0)
MCV: 87.8 fL (ref 80.0–100.0)
Monocytes Absolute: 0.8 10*3/uL (ref 0.1–1.0)
Monocytes Relative: 8 %
Neutro Abs: 6.9 10*3/uL (ref 1.7–7.7)
Neutrophils Relative %: 76 %
Platelets: 189 10*3/uL (ref 150–400)
RBC: 4.99 MIL/uL (ref 4.22–5.81)
RDW: 15.2 % (ref 11.5–15.5)
WBC: 9.1 10*3/uL (ref 4.0–10.5)
nRBC: 0 % (ref 0.0–0.2)

## 2023-03-18 LAB — URINE DRUG SCREEN, QUALITATIVE (ARMC ONLY)
Amphetamines, Ur Screen: NOT DETECTED
Barbiturates, Ur Screen: NOT DETECTED
Benzodiazepine, Ur Scrn: NOT DETECTED
Cannabinoid 50 Ng, Ur ~~LOC~~: NOT DETECTED
Cocaine Metabolite,Ur ~~LOC~~: NOT DETECTED
MDMA (Ecstasy)Ur Screen: NOT DETECTED
Methadone Scn, Ur: NOT DETECTED
Opiate, Ur Screen: POSITIVE — AB
Phencyclidine (PCP) Ur S: NOT DETECTED
Tricyclic, Ur Screen: POSITIVE — AB

## 2023-03-18 LAB — URINALYSIS, ROUTINE W REFLEX MICROSCOPIC
Bilirubin Urine: NEGATIVE
Glucose, UA: NEGATIVE mg/dL
Hgb urine dipstick: NEGATIVE
Ketones, ur: NEGATIVE mg/dL
Leukocytes,Ua: NEGATIVE
Nitrite: NEGATIVE
Protein, ur: NEGATIVE mg/dL
Specific Gravity, Urine: 1.018 (ref 1.005–1.030)
pH: 5 (ref 5.0–8.0)

## 2023-03-18 LAB — BLOOD GAS, VENOUS
Acid-Base Excess: 5.5 mmol/L — ABNORMAL HIGH (ref 0.0–2.0)
Bicarbonate: 31.6 mmol/L — ABNORMAL HIGH (ref 20.0–28.0)
O2 Saturation: 65.7 %
Patient temperature: 37
pCO2, Ven: 51 mmHg (ref 44–60)
pH, Ven: 7.4 (ref 7.25–7.43)
pO2, Ven: 41 mmHg (ref 32–45)

## 2023-03-18 LAB — CBG MONITORING, ED: Glucose-Capillary: 118 mg/dL — ABNORMAL HIGH (ref 70–99)

## 2023-03-18 MED ORDER — LEVOTHYROXINE SODIUM 50 MCG PO TABS
150.0000 ug | ORAL_TABLET | Freq: Every day | ORAL | Status: DC
  Administered 2023-03-20 – 2023-03-24 (×5): 150 ug via ORAL
  Filled 2023-03-18 (×5): qty 1

## 2023-03-18 MED ORDER — LACTATED RINGERS IV SOLN: INTRAVENOUS | Status: DC

## 2023-03-18 MED ORDER — ACETAMINOPHEN 325 MG PO TABS: 650.0000 mg | ORAL_TABLET | Freq: Four times a day (QID) | ORAL | Status: DC | PRN

## 2023-03-18 MED ORDER — INSULIN ASPART 100 UNIT/ML IJ SOLN
0.0000 [IU] | Freq: Every day | INTRAMUSCULAR | Status: DC
  Filled 2023-03-18: qty 1

## 2023-03-18 MED ORDER — SODIUM CHLORIDE 0.9% FLUSH
3.0000 mL | Freq: Two times a day (BID) | INTRAVENOUS | Status: DC
  Administered 2023-03-19 – 2023-03-24 (×11): 3 mL via INTRAVENOUS

## 2023-03-18 MED ORDER — ACETAMINOPHEN 650 MG RE SUPP: 650.0000 mg | Freq: Four times a day (QID) | RECTAL | Status: DC | PRN

## 2023-03-18 MED ORDER — LACTATED RINGERS IV BOLUS
500.0000 mL | Freq: Once | INTRAVENOUS | Status: AC
  Administered 2023-03-18: 500 mL via INTRAVENOUS

## 2023-03-18 MED ORDER — ENOXAPARIN SODIUM 80 MG/0.8ML IJ SOSY
0.5000 mg/kg | PREFILLED_SYRINGE | INTRAMUSCULAR | Status: DC
  Administered 2023-03-19: 70 mg via SUBCUTANEOUS
  Filled 2023-03-18: qty 0.7

## 2023-03-18 MED ORDER — INSULIN ASPART 100 UNIT/ML IJ SOLN
0.0000 [IU] | Freq: Three times a day (TID) | INTRAMUSCULAR | Status: DC
  Administered 2023-03-19: 5 [IU] via SUBCUTANEOUS
  Administered 2023-03-19: 3 [IU] via SUBCUTANEOUS
  Administered 2023-03-20: 2 [IU] via SUBCUTANEOUS
  Administered 2023-03-20: 3 [IU] via SUBCUTANEOUS
  Administered 2023-03-20: 2 [IU] via SUBCUTANEOUS
  Administered 2023-03-21: 3 [IU] via SUBCUTANEOUS
  Administered 2023-03-21: 2 [IU] via SUBCUTANEOUS
  Administered 2023-03-22 (×2): 3 [IU] via SUBCUTANEOUS
  Administered 2023-03-22: 2 [IU] via SUBCUTANEOUS
  Administered 2023-03-23: 5 [IU] via SUBCUTANEOUS
  Administered 2023-03-23: 3 [IU] via SUBCUTANEOUS
  Administered 2023-03-23: 2 [IU] via SUBCUTANEOUS
  Administered 2023-03-24 (×2): 3 [IU] via SUBCUTANEOUS
  Filled 2023-03-18 (×15): qty 1

## 2023-03-18 NOTE — Assessment & Plan Note (Signed)
Patient has AKI.  Etiology under workup, check urinalysis urine sodium creatinine and bladder scanning.  Patient does not have report of nausea vomiting or diarrhea, does not seem to be on Lasix.  I will give gentle hydration and monitor response, hold off on metformin

## 2023-03-18 NOTE — Assessment & Plan Note (Addendum)
Noted at SNF today. Resolved at this time. Eitiology not identified.  Patient urine screen is positive for opiates.  Patient does not seem to be on any prescribed opiates.  There was no response to narcan enroute. Will monitor clinically. Per report patient has had episodes like this before. Will get EEG to rule out seizures.  Recent MRI from March in file.  We will be gentle with reintroduction of patient's psychoactive medications.

## 2023-03-18 NOTE — ED Provider Notes (Signed)
Emergency department handoff note  Care of this patient was signed out to me at the end of the previous provider shift.  All pertinent patient information was conveyed and all questions were answered.  Patient pending urinalysis and urine drug screen.  Urinalysis not show any evidence of acute infection however urine drug screen did show evidence of tricyclics and opiates.  Patient continues to have altered mental status at this time and will require admission to the internal medicine service for further evaluation and management  Dispo: Admit to medicine     Merwyn Katos, MD 03/18/23 2259

## 2023-03-18 NOTE — ED Notes (Signed)
Patient transported to CT 

## 2023-03-18 NOTE — ED Triage Notes (Signed)
Pt to ED via [facility] from [facility]. Per EMS pt is not usual self, normally able to ambulate and get around but has not been able to today. EMS reports vitals WNL, CBG 143. Pt appears lethargic, falls asleep during questions.

## 2023-03-18 NOTE — ED Notes (Signed)
Assumed care of pt, found him sleeping in the bed.  RN was able to wake him up easily but he quickly feel back asleep. He appears to have the same mental status as reported, gruff speech, knows he is in the hospital but not sure why.  Denies any pain or any other needs at this time.

## 2023-03-18 NOTE — ED Provider Notes (Addendum)
[facility] Provider Note    Event Date/Time   First MD Initiated Contact with Patient 03/18/23 1341     (approximate)   History   Altered Mental Status   HPI  [name] [name] is a 77 y.o. male with past medical history of early dementia, COPD, CHF, hyperlipidemia, hypothyroidism who presents with altered mental status.  Patient cannot really tell me much in terms of history.  Tells me he is seeing bright lights and is going through a rainbow fire.  When asked specifically about pain he is denying anything.  Not sure why he is here.  I spoke with [facility] who tells me that patient is typically up speaking normal in his wheelchair, does not ambulate but is typically awake.  Today he was difficult to arouse and repeatedly falling asleep.  They do note that other members at the facility seem to think that his speech and behavior get worse the day after his sister visits and they are wondering if she is giving him anything.  Patient had admission on 3/26 for altered mental status and slurred speech.  Workup was essentially negative.  Speech difficulty was felt to be chronic.     Past Medical History:  Diagnosis Date   Anxiety disorder 05/05/2014   unspecifed   Arthritis    CHF (congestive heart failure) (HCC)    Colonic polyp    COPD (chronic obstructive pulmonary disease) (HCC)    Depression    Diabetes mellitus without complication (HCC)    Dysphagia    Dyspnea    Elevated prostate specific antigen (PSA)    Essential (primary) hypertension 05/05/2014   GERD (gastroesophageal reflux disease)    GI bleed    due to diverticulitis    Heart murmur    hx of 1960s   Hemorrhoids    History of thyroid cancer 05/05/2014   Hypercholesteremia    Hyperlipidemia    Hypertension    Hypothyroidism 05/05/2014   Male erectile dysfunction 12/29/2012   Malignant neoplasm of prostate (HCC)    10/30/2011 T1c - Identified by needle biopsy . Gleason 7 (3+4) 5  of 12 cores, Bilateral    Pneumonia    Prediabetes 06/22/2017   Sleep apnea    CPAP , report on chart , not used in last 2 weeks    Urinary obstruction    not elsewhere classified    Patient Active Problem List   Diagnosis Date Noted   Acute encephalopathy 02/05/2023   Lethargy 02/04/2023   Cyanosis 02/04/2023   Speech abnormality 02/04/2023   Bradycardia 02/04/2023   Esophageal thickening 02/04/2023   Liver nodule 02/04/2023   Cerebral infarct (HCC) 02/04/2023   Dehydration 02/04/2023   TIA (transient ischemic attack) 07/05/2022   Abnormal MRI of head 07/05/2022   Uncontrolled diabetes mellitus with hyperglycemia, without long-term current use of insulin (HCC)    Morbid obesity (HCC)    Cervical myelopathy with history of cervical discectomy and fusion (HCC)    Esophageal dysphagia 11/18/2020   Elevated BUN 11/18/2020   AKI (acute kidney injury) (HCC) 11/18/2020   HCAP (healthcare-associated pneumonia)    Mucus plugging of bronchi    Left cervical radiculopathy 10/26/2020   Acute cervical radiculopathy 10/26/2020   Pressure injury of skin 10/26/2020   Problems with swallowing and mastication    Stricture and stenosis of esophagus    Hx of colonic polyps    Polyp of ascending colon    Bilateral carpal tunnel syndrome  02/13/2020   Neurocardiogenic syncope 03/02/2019   Lymphedema 02/04/2019   Tobacco use 02/03/2019   CHF (congestive heart failure) (HCC) 12/23/2018   Chronic diastolic CHF (congestive heart failure) (HCC) 11/27/2018   Cellulitis 11/27/2018   Hypothyroidism 11/27/2018   OSA (obstructive sleep apnea) 11/27/2018   Hypertension 11/27/2018   Mixed hyperlipidemia 11/27/2018   Protein-calorie malnutrition (HCC) 03/02/2018   Neuropathy 03/02/2018   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   Difficulty in voiding 03/01/2018   Vitamin D deficiency 03/01/2018   Vitamin B12 deficiency 03/01/2018   Insomnia 03/01/2018   COPD (chronic obstructive pulmonary disease)  (HCC) 02/11/2018   Pain due to onychomycosis of nail 02/11/2018   Onychomycosis 02/11/2018   Traumatic ecchymosis of foot, right, initial encounter 02/11/2018   Xerostomia 02/11/2018   Frequent falls 02/11/2018   Acute respiratory failure (HCC) 02/07/2018   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   History of thyroid cancer 05/05/2014     Physical Exam  Triage Vital Signs: ED Triage Vitals  Enc Vitals Group     BP 03/18/23 1342 (!) 142/71     Pulse Rate 03/18/23 1342 62     Resp 03/18/23 1342 18     Temp 03/18/23 1351 98.6 F (37 C)     Temp Source 03/18/23 1351 Axillary     SpO2 03/18/23 1342 95 %     Weight 03/18/23 1343 (!) 306 lb 3.5 oz (138.9 kg)     Height 03/18/23 1343 6\' 2"  (1.88 m)     Head Circumference --      Peak Flow --      Pain Score 03/18/23 1343 0     Pain Loc --      Pain Edu? --      Excl. in GC? --     Most recent vital signs: Vitals:   03/18/23 1500 03/18/23 1530  BP: (!) 142/72 (!) 147/68  Pulse: 63 64  Resp: 18 16  Temp:    SpO2: 95% 94%     General: Awake, chronically ill-appearing, obese CV:  Good peripheral perfusion.  Resp:  Normal effort.  No wheezing Abd:  No distention.  Distended but nontender Neuro:             Awake, Alert, Oriented x 3  Patient is sleepy but does arouse to voice, keeps his eyes closed unless asked to open them Other:  Patient's speech is slurred, face symmetric normal tongue movement 5 out of 5 strength in bilateral upper and lower extremities   ED Results / Procedures / Treatments  Labs (all labs ordered are listed, but only abnormal results are displayed) Labs Reviewed  COMPREHENSIVE METABOLIC PANEL - Abnormal; Notable for the following components:      Result Value   Glucose, Bld 124 (*)    Creatinine, Ser 1.25 (*)    Albumin 3.2 (*)    GFR, Estimated 59 (*)    All other components within normal limits  BLOOD GAS, VENOUS - Abnormal; Notable for the following components:    Bicarbonate 31.6 (*)    Acid-Base Excess 5.5 (*)    All other components within normal limits  CBC WITH DIFFERENTIAL/PLATELET  CBC WITH DIFFERENTIAL/PLATELET  URINALYSIS, ROUTINE W REFLEX MICROSCOPIC  URINE DRUG SCREEN, QUALITATIVE ([facility] ONLY)     EKG  EKG reviewed interpreted myself shows sinus rhythm with a right bundle branch block no acute ischemic changes   RADIOLOGY I reviewed and interpreted the CT scan of the brain which does not show  any acute intracranial process   PROCEDURES:  Critical Care performed: No  Procedures  The patient is on the cardiac monitor to evaluate for evidence of arrhythmia and/or significant heart rate changes.   MEDICATIONS ORDERED IN ED: Medications  lactated ringers bolus 500 mL (500 mLs Intravenous New Bag/Given 03/18/23 1409)     IMPRESSION / MDM / ASSESSMENT AND PLAN / ED COURSE  I reviewed the triage vital signs and the nursing notes.                              Patient's presentation is most consistent with acute presentation with potential threat to life or bodily function.  Differential diagnosis includes, but is not limited to, metabolic encephalopathy, infection such as UTI, pneumonia, COPD exacerbation, hypercarbia, acute stroke, toxic [place], withdrawal, polypharmacy  Patient is a 77 year old male who presents today with altered mental status.  He was found to have slurred speech and be difficult to arouse today at this facility which apparently is not his baseline.  Nursing over the facility tells me he normally speaks clearly and is up in his wheelchair and active during the day.  On my exam she is sleepy but he awakens appropriately.  Keeps his eyes closed when I ask him to open his pupils are equal face symmetric and he has good strength in bilateral upper and lower extremities.  He has no complaints at this time.  He is talking about bright lights and a ring of fire, difficult to understand him because of his speech being  slurred.  Plan to obtain CT head basic labs urinalysis and UDS.  Patient does look somewhat dry we will give a bolus of fluid and reassess.  Patient's labs overall reassuring creatinine near baseline no leukocytosis.  EKG showing baseline left bundle.  CT head with no acute abnormality.  Chest x-ray showing possible pulmonary vascular congestion.  VBG does not show any hypercarbia.  Urinalysis and UDS still pending.  Reassessment patient still quite sleepy.  I do think she will need admission.  Signed out to oncoming provider pending urine.        FINAL CLINICAL IMPRESSION(S) / ED DIAGNOSES   Final diagnoses:  Encephalopathy     Rx / DC Orders   ED Discharge Orders     None        Note:  This document was prepared using Dragon voice recognition software and may include unintentional dictation errors.   Georga Hacking, MD 03/18/23 1551    Georga Hacking, MD 03/18/23 [id number]

## 2023-03-18 NOTE — H&P (Signed)
History and Physical    Patient: [name] [name] ZOX:[id number] DOB: [birth year] DOA: 03/18/2023 DOS: the patient was seen and examined on 03/18/2023 PCP: [name] Dade, DO  Patient coming from: SNF  Chief Complaint:  Chief Complaint  Patient presents with   Altered Mental Status   HPI: [name] [name] is a 77 y.o. male with medical history significant of episodes of altered mental status that have been previously investigated which manifest as somnolence and not responding.  Patient was in his usual state of health till earlier today when he was at his skilled nursing facility and was found to be not responding and lethargic.  This is history obtained from secondary sources as patient does not recall why he is in the hospital.  Patient has documented dementia in the chart.  Patient was sent to [facility] accordingly.  ER course is notable for the patient having persistent somnolence.  Workup being mostly negative.  Patient having poor response to Narcan.  Patient has however since then woken up medical evaluation is sought.  Patient offers no complaints at this time.  Does not know why he is at the hospital.  No report of fever rigors nausea vomiting diarrhea rash in skin trauma.  Review of Systems: As mentioned in the history of present illness. All other systems reviewed and are negative.  However patient has poor recall Past Medical History:  Diagnosis Date   Anxiety disorder 05/05/2014   unspecifed   Arthritis    CHF (congestive heart failure) (HCC)    Colonic polyp    COPD (chronic obstructive pulmonary disease) (HCC)    Depression    Diabetes mellitus without complication (HCC)    Dysphagia    Dyspnea    Elevated prostate specific antigen (PSA)    Essential (primary) hypertension 05/05/2014   GERD (gastroesophageal reflux disease)    GI bleed    due to diverticulitis    Heart murmur    hx of 1960s   Hemorrhoids    History of thyroid cancer 05/05/2014   Hypercholesteremia     Hyperlipidemia    Hypertension    Hypothyroidism 05/05/2014   Male erectile dysfunction 12/29/2012   Malignant neoplasm of prostate (HCC)    10/30/2011 T1c - Identified by needle biopsy . Gleason 7 (3+4) 5 of 12 cores, Bilateral    Pneumonia    Prediabetes 06/22/2017   Sleep apnea    CPAP , report on chart , not used in last 2 weeks    Urinary obstruction    not elsewhere classified   Past Surgical History:  Procedure Laterality Date   BACK SURGERY     lumbar 1987    BACK SURGERY     C-6- Plate&Pin   CATARACT EXTRACTION W/PHACO Right 09/15/2018   Procedure: CATARACT EXTRACTION PHACO AND INTRAOCULAR LENS PLACEMENT (IOC) RIGHT;  Surgeon: Lockie Mola, MD;  Location: [facility];  Service: Ophthalmology;  Laterality: Right;  Requests to be last case   CATARACT EXTRACTION W/PHACO Left 10/06/2018   Procedure: CATARACT EXTRACTION PHACO AND INTRAOCULAR LENS PLACEMENT (IOC)  LEFT;  Surgeon: Lockie Mola, MD;  Location: [facility];  Service: Ophthalmology;  Laterality: Left;   CERVICAL DISC SURGERY     COLONOSCOPY     10/06/2011, 06/05/2006, 05/06/2002 adematous polyps: CBF 09/2014; Recall Ltr mailed 02/27/2015 (dw)   COLONOSCOPY WITH PROPOFOL N/A 07/06/2015   Procedure: COLONOSCOPY WITH PROPOFOL;  Surgeon: Scot Jun, MD;  Location: [facility] ENDOSCOPY;  Service: Endoscopy;  Laterality: N/A;  COLONOSCOPY WITH PROPOFOL N/A 09/01/2018   Procedure: COLONOSCOPY WITH PROPOFOL;  Surgeon: Scot Jun, MD;  Location: [facility] ENDOSCOPY;  Service: Endoscopy;  Laterality: N/A;   COLONOSCOPY WITH PROPOFOL N/A 04/17/2020   Procedure: COLONOSCOPY WITH PROPOFOL;  Surgeon: Midge Minium, MD;  Location: [facility] ENDOSCOPY;  Service: Endoscopy;  Laterality: N/A;   ESOPHAGOGASTRODUODENOSCOPY (EGD) WITH PROPOFOL N/A 04/17/2020   Procedure: ESOPHAGOGASTRODUODENOSCOPY (EGD) WITH PROPOFOL;  Surgeon: Midge Minium, MD;  Location: [facility] ENDOSCOPY;  Service: Endoscopy;  Laterality: N/A;   EYE  SURGERY     HERNIA REPAIR     umbilical hernia repair    LAMINECTOMY FOR EXCISION / EVACUATION INTRASPINAL LESION     lumbar   OTHER SURGICAL HISTORY     right rotator cuff surgery    OTHER SURGICAL HISTORY     bilateral tubes in ears    POSTERIOR CERVICAL LAMINECTOMY N/A 10/27/2020   Procedure: POSTERIOR CERVICAL LAMINECTOMY CERVICAL THREE-CERVICAL SIX;  Surgeon: Jadene Pierini, MD;  Location: [facility] OR;  Service: Neurosurgery;  Laterality: N/A;   POSTERIOR LAMINECTOMY / DECOMPRESSION CERVICAL SPINE     PROSTATE BIOPSY     10/22/2011 Volume:55.8 cc's, PSA:4.5, Free PSA:12%   ROBOT ASSISTED LAPAROSCOPIC RADICAL PROSTATECTOMY  12/15/2011   Procedure: ROBOTIC ASSISTED LAPAROSCOPIC RADICAL PROSTATECTOMY LEVEL 2;  Surgeon: Crecencio Mc, MD;  Location: [facility] ORS;  Service: Urology;  Laterality: N/A;       ROTATOR CUFF REPAIR Right    SHOULDER ARTHROSCOPY WITH SUBACROMIAL DECOMPRESSION, ROTATOR CUFF REPAIR AND BICEP TENDON REPAIR Left 11/07/2021   Procedure: SHOULDER ARTHROSCOPY WITH DEBRIDEMENT, DECOMPRESSION, ROTATOR CUFF REPAIR AND BICEPS TENODESIS.;  Surgeon: Christena Flake, MD;  Location: [facility] ORS;  Service: Orthopedics;  Laterality: Left;   TONSILLECTOMY     TOTAL THYROIDECTOMY     Social History:  reports that he quit smoking about 4 years ago. His smoking use included cigarettes. He has a 17.50 pack-year smoking history. He has never used smokeless tobacco. He reports that he does not currently use alcohol after a past usage of about 8.0 standard drinks of alcohol per week. He reports that he does not use drugs.  No Known Allergies  Family History  Problem Relation Age of Onset   Hypertension Mother    Anxiety disorder Mother    Stroke Mother        "mini strokes"   Coronary artery disease Mother    Heart attack Mother    Emphysema Father    Heart disease Father    Scoliosis Father    Coronary artery disease Father    GU problems Neg Hx    Kidney disease Neg Hx    Prostate  cancer Neg Hx     Prior to Admission medications   Medication Sig Start Date End Date Taking? Authorizing Provider  albuterol (VENTOLIN HFA) 108 (90 Base) MCG/ACT inhaler Inhale 2 puffs into the lungs every 6 (six) hours as needed for wheezing or shortness of breath.    [provider]  Artificial Tear Solution (GENTEAL TEARS) 0.1-0.2-0.3 % SOLN Place 1 drop into both eyes in the morning, at noon, in the evening, and at bedtime. 11/13/22   [provider]  atorvastatin (LIPITOR) 40 MG tablet Take 1 tablet (40 mg total) by mouth daily. 07/06/22 02/04/23  Hollice Espy, MD  Benzocaine-Menthol 15-3.6 MG LOZG Use as directed 1 lozenge in the mouth or throat every 4 (four) hours as needed (sore throat).    [provider]  bisacodyl (DULCOLAX) 10 MG  suppository Place 10 mg rectally daily as needed for moderate constipation.    [provider]  clopidogrel (PLAVIX) 75 MG tablet Take 1 tablet (75 mg total) by mouth daily. 07/06/22   Hollice Espy, MD  colchicine 0.6 MG tablet Take 0.5 tablets (0.3 mg total) by mouth 2 (two) times daily for 7 days. 02/10/23 02/17/23  Kathlen Mody, MD  donepezil (ARICEPT) 10 MG tablet Take 10 mg by mouth at bedtime. 01/04/21   [provider]  ergocalciferol (VITAMIN D2) 1.25 MG (50000 UT) capsule Take 50,000 Units by mouth every Wednesday. Take every Monday and Wednesday    [provider]  FLUoxetine (PROZAC) 40 MG capsule Take 40 mg by mouth daily.    [provider]  fluticasone-salmeterol (WIXELA INHUB) 250-50 MCG/ACT AEPB Inhale 1 puff into the lungs in the morning and at bedtime.    [provider]  furosemide (LASIX) 40 MG tablet Take 40 mg by mouth 2 (two) times daily.    [provider]  hydrocortisone 2.5 % cream Apply 1 application topically every 12 (twelve) hours as needed (per rectum, hemorrhoids).    [provider]  insulin aspart (NOVOLOG) 100 UNIT/ML injection  CBG 70 - 120: 0 units  CBG 121 - 150: 3 units  CBG 151 - 200: 4 units  CBG 201 - 250: 7 units  CBG 251 - 300: 11 units  CBG 301 - 350: 15 units  CBG 351 - 400: 20 units 02/10/23   Kathlen Mody, MD  insulin aspart (NOVOLOG) 100 UNIT/ML injection Inject 10 Units into the skin 3 (three) times daily with meals. 02/10/23   Kathlen Mody, MD  insulin glargine-yfgn (SEMGLEE) 100 UNIT/ML injection Inject 0.3 mLs (30 Units total) into the skin at bedtime. 02/10/23   Kathlen Mody, MD  isosorbide dinitrate (ISORDIL) 10 MG tablet Take 1 tablet (10 mg total) by mouth 3 (three) times daily. 07/06/22   Hollice Espy, MD  ketoconazole (NIZORAL) 2 % shampoo Apply topically once a week. thursday 05/13/22   [provider]  levothyroxine (SYNTHROID) 150 MCG tablet Take 150 mcg by mouth daily. 01/29/23   [provider]  LINZESS 290 MCG CAPS capsule Take 290 mcg by mouth daily. 06/09/22   [provider]  magnesium oxide (MAG-OX) 400 MG tablet Take 400 mg by mouth 2 (two) times daily. 01/25/21   [provider]  midodrine (PROAMATINE) 5 MG tablet Take 5 mg by mouth daily. Hold for SBP>100 or DBP >100    [provider]  MIRVASO 0.33 % GEL Apply 1 Application topically daily. Apply to face daily 11/20/22   [provider]  NYSTATIN powder Apply topically. 06/23/22   [provider]  Omega-3 Fatty Acids (FISH OIL) 1000 MG CAPS Take 1 capsule by mouth daily. 11/20/22   [provider]  pantoprazole (PROTONIX) 40 MG tablet Take 1 tablet (40 mg total) by mouth daily. 07/07/22   Hollice Espy, MD  polyethylene glycol (MIRALAX / GLYCOLAX) 17 g packet Take 17 g by mouth daily. Mix one tablespoon with 8oz of your favorite juice or water every day until you are having soft formed stools. Then start taking once daily if you didn't have a stool the day before. 11/22/22   Willy Eddy, MD  potassium chloride SA (K-DUR,KLOR-CON) 20 MEQ tablet Take 20 mEq by  mouth 3 (three) times daily with meals.    [provider]  pregabalin (LYRICA) 300 MG capsule Take 300 mg  by mouth 2 (two) times daily. 02/04/23   [provider]  risperiDONE (RISPERDAL) 0.5 MG tablet Take 1 tablet (0.5 mg total) by mouth at bedtime. 02/10/23   Kathlen Mody, MD  senna-docusate (SENOKOT-S) 8.6-50 MG tablet Take 1 tablet by mouth 2 (two) times daily.    [provider]  simethicone (MYLICON) 125 MG chewable tablet Chew 125 mg by mouth 2 (two) times daily.    [provider]  SitaGLIPtin-MetFORMIN HCl [phone] MG TB24 Take 1 tablet by mouth daily. 07/06/22   Hollice Espy, MD  traZODone (DESYREL) 50 MG tablet Take 0.5 tablets (25 mg total) by mouth at bedtime as needed for sleep. Patient taking differently: Take 50 mg by mouth at bedtime. 07/06/22   Hollice Espy, MD    Physical Exam: Vitals:   03/18/23 1800 03/18/23 1900 03/18/23 1927 03/18/23 2000  BP: 137/74 (!) 142/66  (!) 151/68  Pulse: 66 (!) 59  62  Resp: 16 16  17   Temp:   97.9 F (36.6 C)   TempSrc:   Oral   SpO2: 90% 93%  94%  Weight:      Height:       General the patient is was sleeping when initially encountered, however woke up to verbal stimulation.  Seems to be in no distress.  Patient was oriented to season patient was oriented to the president of the [place].  Patient said that he assumed he was at [facility] and this is indeed correct.  Therefore at this time patient did not seem to have marked disorientation.  Patient was fairly attentive to the conversation, pleasant and calm and reasonably well interactive.  Patient voice had wet nature.  However is able to cough well there was some dry mouth. Respiratory exam: Bilateral intravesicular Cardiovascular exam S1-S2 normal Abdomen soft nontender, obese Extremities warm without edema no focal neurologic findings.  Patient is moving all 4 extremities, symmetric facies pupils are equal and round. Data Reviewed:  Labs on  Admission:  Results for orders placed or performed during the hospital encounter of 03/18/23 (from the past 24 hour(s))  Comprehensive metabolic panel     Status: Abnormal   Collection Time: 03/18/23  1:45 PM  Result Value Ref Range   Sodium 141 135 - 145 mmol/L   Potassium 4.0 3.5 - 5.1 mmol/L   Chloride 104 98 - 111 mmol/L   CO2 28 22 - 32 mmol/L   Glucose, Bld 124 (H) 70 - 99 mg/dL   BUN 20 8 - 23 mg/dL   Creatinine, Ser 1.61 (H) 0.61 - 1.24 mg/dL   Calcium 9.1 8.9 - 09.6 mg/dL   Total Protein 6.9 6.5 - 8.1 g/dL   Albumin 3.2 (L) 3.5 - 5.0 g/dL   AST 23 15 - 41 U/L   ALT 22 0 - 44 U/L   Alkaline Phosphatase 104 38 - 126 U/L   Total Bilirubin 1.0 0.3 - 1.2 mg/dL   GFR, Estimated 59 (L) >60 mL/min   Anion gap 9 5 - 15  Blood gas, venous     Status: Abnormal   Collection Time: 03/18/23  2:13 PM  Result Value Ref Range   pH, Ven 7.4 7.25 - 7.43   pCO2, Ven 51 44 - 60 mmHg   pO2, Ven 41 32 - 45 mmHg   Bicarbonate 31.6 (H) 20.0 - 28.0 mmol/L   Acid-Base Excess 5.5 (H) 0.0 - 2.0 mmol/L   O2 Saturation 65.7 %   Patient temperature 37.0  Collection site VEIN   CBC with Differential/Platelet     Status: None   Collection Time: 03/18/23  2:22 PM  Result Value Ref Range   WBC 9.1 4.0 - 10.5 K/uL   RBC 4.99 4.22 - 5.81 MIL/uL   Hemoglobin 13.9 13.0 - 17.0 g/dL   HCT 78.2 95.6 - 21.3 %   MCV 87.8 80.0 - 100.0 fL   MCH 27.9 26.0 - 34.0 pg   MCHC 31.7 30.0 - 36.0 g/dL   RDW 08.6 57.8 - 46.9 %   Platelets 189 150 - 400 K/uL   nRBC 0.0 0.0 - 0.2 %   Neutrophils Relative % 76 %   Neutro Abs 6.9 1.7 - 7.7 K/uL   Lymphocytes Relative 12 %   Lymphs Abs 1.1 0.7 - 4.0 K/uL   Monocytes Relative 8 %   Monocytes Absolute 0.8 0.1 - 1.0 K/uL   Eosinophils Relative 2 %   Eosinophils Absolute 0.2 0.0 - 0.5 K/uL   Basophils Relative 1 %   Basophils Absolute 0.1 0.0 - 0.1 K/uL   Immature Granulocytes 1 %   Abs Immature Granulocytes 0.06 0.00 - 0.07 K/uL  Urinalysis, Routine w reflex  microscopic -Urine, Clean Catch     Status: Abnormal   Collection Time: 03/18/23  6:10 PM  Result Value Ref Range   Color, Urine YELLOW (A) YELLOW   APPearance HAZY (A) CLEAR   Specific Gravity, Urine 1.018 1.005 - 1.030   pH 5.0 5.0 - 8.0   Glucose, UA NEGATIVE NEGATIVE mg/dL   Hgb urine dipstick NEGATIVE NEGATIVE   Bilirubin Urine NEGATIVE NEGATIVE   Ketones, ur NEGATIVE NEGATIVE mg/dL   Protein, ur NEGATIVE NEGATIVE mg/dL   Nitrite NEGATIVE NEGATIVE   Leukocytes,Ua NEGATIVE NEGATIVE  Urine Drug Screen, Qualitative ([facility] only)     Status: Abnormal   Collection Time: 03/18/23  6:10 PM  Result Value Ref Range   Tricyclic, Ur Screen POSITIVE (A) NONE DETECTED   Amphetamines, Ur Screen NONE DETECTED NONE DETECTED   MDMA (Ecstasy)Ur Screen NONE DETECTED NONE DETECTED   Cocaine Metabolite,Ur [place] NONE DETECTED NONE DETECTED   Opiate, Ur Screen POSITIVE (A) NONE DETECTED   Phencyclidine (PCP) Ur S NONE DETECTED NONE DETECTED   Cannabinoid 50 Ng, Ur [place] NONE DETECTED NONE DETECTED   Barbiturates, Ur Screen NONE DETECTED NONE DETECTED   Benzodiazepine, Ur Scrn NONE DETECTED NONE DETECTED   Methadone Scn, Ur NONE DETECTED NONE DETECTED   Basic Metabolic Panel:Radiological Exams on Admission:  CT HEAD WO CONTRAST ( )  Result Date: 03/18/2023 CLINICAL DATA:  Mental status change, unknown cause. EXAM: CT HEAD WITHOUT CONTRAST TECHNIQUE: Contiguous axial images were obtained from the base of the skull through the vertex without intravenous contrast. RADIATION DOSE REDUCTION: This exam was performed according to the departmental dose-optimization program which includes automated exposure control, adjustment of the mA and/or kV according to patient size and/or use of iterative reconstruction technique. COMPARISON:  Head CT 02/04/2023.  MRI brain 02/05/2023. FINDINGS: Brain: No acute hemorrhage. Unchanged chronic small-vessel disease. Cortical gray-Maris differentiation is otherwise preserved.  Prominence of the ventricles and sulci within expected range for age. No hydrocephalus or extra-axial collection. No mass effect or midline shift. Vascular: No hyperdense vessel or unexpected calcification. Skull: No calvarial fracture or suspicious bone lesion. Skull base is unremarkable. Sinuses/Orbits: Mild mucosal disease throughout the paranasal sinuses. Orbits are unremarkable. Mastoids are well aerated. Other: None. IMPRESSION: No acute intracranial abnormality. Electronically Signed   By: Elwyn Reach.D.  On: 03/18/2023 14:47   DG Chest Portable 1 View  Result Date: 03/18/2023 CLINICAL DATA:  Shortness of breath. EXAM: PORTABLE CHEST 1 VIEW COMPARISON:  02/04/2023. FINDINGS: Low lung volumes accentuate the pulmonary vasculature and cardiomediastinal silhouette. No consolidation. Possible mild superimposed pulmonary venous congestion. No pleural effusion or pneumothorax. IMPRESSION: Low lung volumes with possible mild superimposed pulmonary venous congestion. Electronically Signed   By: Orvan Falconer M.D.   On: 03/18/2023 14:35    EKG: Independently reviewed. Sinus rythma. [place]orn[place] RBBB   Assessment and Plan: * AMS (altered mental status) Noted at SNF today. Resolved at this time. Eitiology not identified.  Patient urine screen is positive for opiates.  Patient does not seem to be on any prescribed opiates.  There was no response to narcan enroute. Will monitor clinically. Per report patient has had episodes like this before. Will get EEG to rule out seizures.  Recent MRI from March in file.  We will be gentle with reintroduction of patient's psychoactive medications.  Elevated serum creatinine Patient has AKI.  Etiology under workup, check urinalysis urine sodium creatinine and bladder scanning.  Patient does not have report of nausea vomiting or diarrhea, does not seem to be on Lasix.  I will give gentle hydration and monitor response, hold off on metformin    Lasix and psychoactive  meds held. Please review medrec periodically.  Advance Care Planning:   Code Status: Full Code   Consults: none  Family Communication: patien does not have family per patient. Has friends. HCP is noted in chart  Severity of Illness: The appropriate patient status for this patient is OBSERVATION. Observation status is judged to be reasonable and necessary in order to provide the required intensity of service to ensure the patient's safety. The patient's presenting symptoms, physical exam findings, and initial radiographic and laboratory data in the context of their medical condition is felt to place them at decreased risk for further clinical deterioration. Furthermore, it is anticipated that the patient will be medically stable for discharge from the hospital within 2 midnights of admission.   Author: Nolberto Hanlon, MD 03/18/2023 9:24 PM  For on call review [email or web address].

## 2023-03-19 ENCOUNTER — Observation Stay: Payer: Medicare Other

## 2023-03-19 ENCOUNTER — Encounter: Payer: Self-pay | Admitting: Internal Medicine

## 2023-03-19 ENCOUNTER — Other Ambulatory Visit: Payer: Self-pay

## 2023-03-19 DIAGNOSIS — R569 Unspecified convulsions: Secondary | ICD-10-CM | POA: Diagnosis not present

## 2023-03-19 DIAGNOSIS — R4182 Altered mental status, unspecified: Secondary | ICD-10-CM

## 2023-03-19 DIAGNOSIS — G9340 Encephalopathy, unspecified: Secondary | ICD-10-CM | POA: Diagnosis not present

## 2023-03-19 LAB — BASIC METABOLIC PANEL
Anion gap: 12 (ref 5–15)
BUN: 21 mg/dL (ref 8–23)
CO2: 25 mmol/L (ref 22–32)
Calcium: 8.9 mg/dL (ref 8.9–10.3)
Chloride: 104 mmol/L (ref 98–111)
Creatinine, Ser: 1 mg/dL (ref 0.61–1.24)
GFR, Estimated: >60 mL/min (ref >60)
Glucose, Bld: 145 mg/dL — ABNORMAL HIGH (ref 70–99)
Potassium: 3.6 mmol/L (ref 3.5–5.1)
Sodium: 141 mmol/L (ref 135–145)

## 2023-03-19 LAB — CREATININE, URINE, RANDOM: Creatinine, Urine: 135 mg/dL

## 2023-03-19 LAB — GLUCOSE, CAPILLARY
Glucose-Capillary: 133 mg/dL — ABNORMAL HIGH (ref 70–99)
Glucose-Capillary: 228 mg/dL — ABNORMAL HIGH (ref 70–99)
Glucose-Capillary: 188 mg/dL — ABNORMAL HIGH (ref 70–99)

## 2023-03-19 LAB — CBC
HCT: 41.9 % (ref 39.0–52.0)
Hemoglobin: 13.2 g/dL (ref 13.0–17.0)
MCH: 27.7 pg (ref 26.0–34.0)
MCHC: 31.5 g/dL (ref 30.0–36.0)
MCV: 88 fL (ref 80.0–100.0)
Platelets: 173 10*3/uL (ref 150–400)
RBC: 4.76 MIL/uL (ref 4.22–5.81)
RDW: 15 % (ref 11.5–15.5)
WBC: 9.8 10*3/uL (ref 4.0–10.5)
nRBC: 0 % (ref 0.0–0.2)

## 2023-03-19 LAB — APTT: aPTT: 28 seconds (ref 24–36)

## 2023-03-19 LAB — SODIUM, URINE, RANDOM: Sodium, Ur: 65 mmol/L

## 2023-03-19 LAB — PROTIME-INR
INR: 1.1 (ref 0.8–1.2)
Prothrombin Time: 14 seconds (ref 11.4–15.2)

## 2023-03-19 LAB — MAGNESIUM: Magnesium: 1.9 mg/dL (ref 1.7–2.4)

## 2023-03-19 MED ORDER — FLUOXETINE HCL 20 MG PO CAPS
40.0000 mg | ORAL_CAPSULE | Freq: Every day | ORAL | Status: DC
  Administered 2023-03-19 – 2023-03-24 (×6): 40 mg via ORAL
  Filled 2023-03-19 (×6): qty 2

## 2023-03-19 MED ORDER — ALBUTEROL SULFATE (2.5 MG/3ML) 0.083% IN NEBU: 2.5000 mg | INHALATION_SOLUTION | Freq: Four times a day (QID) | RESPIRATORY_TRACT | Status: DC | PRN

## 2023-03-19 MED ORDER — CLOPIDOGREL BISULFATE 75 MG PO TABS
75.0000 mg | ORAL_TABLET | Freq: Every day | ORAL | Status: DC
  Administered 2023-03-19 – 2023-03-24 (×6): 75 mg via ORAL
  Filled 2023-03-19 (×6): qty 1

## 2023-03-19 MED ORDER — ALBUTEROL SULFATE HFA 108 (90 BASE) MCG/ACT IN AERS: 2.0000 | INHALATION_SPRAY | Freq: Four times a day (QID) | RESPIRATORY_TRACT | Status: DC | PRN

## 2023-03-19 MED ORDER — MAGNESIUM SULFATE 2 GM/50ML IV SOLN
2.0000 g | Freq: Once | INTRAVENOUS | Status: AC
  Administered 2023-03-19: 2 g via INTRAVENOUS
  Filled 2023-03-19: qty 50

## 2023-03-19 MED ORDER — MIDODRINE HCL 5 MG PO TABS
5.0000 mg | ORAL_TABLET | Freq: Every day | ORAL | Status: DC
  Filled 2023-03-19: qty 1

## 2023-03-19 MED ORDER — ENOXAPARIN SODIUM 60 MG/0.6ML IJ SOSY
60.0000 mg | PREFILLED_SYRINGE | INTRAMUSCULAR | Status: DC
  Administered 2023-03-20 – 2023-03-24 (×5): 60 mg via SUBCUTANEOUS
  Filled 2023-03-19 (×5): qty 0.6

## 2023-03-19 MED ORDER — POTASSIUM CHLORIDE CRYS ER 20 MEQ PO TBCR
20.0000 meq | EXTENDED_RELEASE_TABLET | Freq: Once | ORAL | Status: AC
  Administered 2023-03-19: 20 meq via ORAL
  Filled 2023-03-19: qty 1

## 2023-03-19 MED ORDER — ATORVASTATIN CALCIUM 20 MG PO TABS
40.0000 mg | ORAL_TABLET | Freq: Every day | ORAL | Status: DC
  Administered 2023-03-19 – 2023-03-24 (×6): 40 mg via ORAL
  Filled 2023-03-19 (×6): qty 2

## 2023-03-19 MED ORDER — INSULIN GLARGINE-YFGN 100 UNIT/ML ~~LOC~~ SOLN
30.0000 [IU] | Freq: Every day | SUBCUTANEOUS | Status: DC
  Administered 2023-03-19 – 2023-03-23 (×5): 30 [IU] via SUBCUTANEOUS
  Filled 2023-03-19 (×6): qty 0.3

## 2023-03-19 MED ORDER — PANTOPRAZOLE SODIUM 40 MG PO TBEC
40.0000 mg | DELAYED_RELEASE_TABLET | Freq: Every day | ORAL | Status: DC
  Administered 2023-03-19 – 2023-03-24 (×6): 40 mg via ORAL
  Filled 2023-03-19 (×6): qty 1

## 2023-03-19 MED ORDER — MAGNESIUM OXIDE 400 MG PO TABS
400.0000 mg | ORAL_TABLET | Freq: Two times a day (BID) | ORAL | Status: DC
  Administered 2023-03-19 – 2023-03-24 (×11): 400 mg via ORAL
  Filled 2023-03-19 (×21): qty 1

## 2023-03-19 MED ORDER — RISPERIDONE 0.5 MG PO TABS
0.5000 mg | ORAL_TABLET | Freq: Every day | ORAL | Status: DC
  Administered 2023-03-19 – 2023-03-23 (×5): 0.5 mg via ORAL
  Filled 2023-03-19 (×5): qty 1

## 2023-03-19 MED ORDER — LINACLOTIDE 145 MCG PO CAPS
290.0000 ug | ORAL_CAPSULE | Freq: Every day | ORAL | Status: DC
  Administered 2023-03-19 – 2023-03-24 (×6): 290 ug via ORAL
  Filled 2023-03-19 (×6): qty 2

## 2023-03-19 MED ORDER — DONEPEZIL HCL 5 MG PO TABS
10.0000 mg | ORAL_TABLET | Freq: Every day | ORAL | Status: DC
  Administered 2023-03-19 – 2023-03-23 (×5): 10 mg via ORAL
  Filled 2023-03-19 (×5): qty 2

## 2023-03-19 MED ORDER — FLUTICASONE FUROATE-VILANTEROL 200-25 MCG/ACT IN AEPB
1.0000 | INHALATION_SPRAY | Freq: Every day | RESPIRATORY_TRACT | Status: DC
  Administered 2023-03-19 – 2023-03-24 (×6): 1 via RESPIRATORY_TRACT
  Filled 2023-03-19: qty 28

## 2023-03-19 NOTE — Procedures (Signed)
Patient Name: [name] [name]  MRN: [id number]  Epilepsy Attending: Charlsie Quest  Referring Physician/Provider: Nolberto Hanlon, MD  Date: 03/19/2023 Duration: 25.58 mins  Patient history: 77yo M with ams getting eeg to evaluate for seizure  Level of alertness: Awake, asleep  AEDs during EEG study: None  Technical aspects: This EEG study was done with scalp electrodes positioned according to the 10-20 International system of electrode placement. Electrical activity was reviewed with band pass filter of 1-70Hz , sensitivity of 7 uV/mm, display speed of 9mm/sec with a 60Hz  notched filter applied as appropriate. EEG data were recorded continuously and digitally stored.  Video monitoring was available and reviewed as appropriate.  Description: The posterior dominant rhythm consists of 7.5 Hz activity of moderate voltage (25-35 uV) seen predominantly in posterior head regions, symmetric and reactive to eye opening and eye closing. Sleep was characterized by vertex waves, sleep spindles (12 to 14 Hz), maximal frontocentral region.  EEG showed intermittent generalized 3 to 6 Hz theta-delta slowing.ow any EEG change. Hyperventilation and photic stimulation were not performed.     ABNORMALITY - Intermittent slow, generalized  IMPRESSION: This study is suggestive of mild diffuse encephalopathy, nonspecific etiology. No seizures or epileptiform discharges were seen throughout the recording.  Wynn Alldredge Annabelle Harman

## 2023-03-19 NOTE — Progress Notes (Signed)
[facility] Progress Note Patient: [name] [name] ZOX:[id number] DOB: 1946/11/15 DOA: 03/18/2023  DOS: the patient was seen and examined on 03/19/2023  Brief hospital course: PMH of obesity, OSA, GERD, COPD, depression, type II DM, CHF, HTN, hypothyroidism presents to the hospital with complaints of encephalopathy. Assessment and Plan: Acute metabolic encephalopathy. Presents with acute confusional complaint. CT of the head unremarkable for any acute abnormality. VBG unremarkable. Mild evidence of AKI. Treated with IV fluid. Had a similar presentation earlier at which time he was thought to be having issues with polypharmacy. Currently no indication to perform MRI brain. In March 2024 MRI brain negative for acute stroke with similar presentation. Suspecting untreated sleep apnea therefore will provide CPAP nightly.   AKI Serum creatinine 0.9.  On admission serum creatinine is at 1.2. Treated with IV fluid.  Will hold fluids for now.  Hypothyroidism:  Tsh WNL. Marland Kitchen Resume synthroid 150 mcg daily.    Hyperlipidemia:  Continue with Lipitor 40 mg daily.    COPD:  No wheezing heard.  Continue with dulera and albuterol prn.   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 Currently on basal bolus regimen. Hemoglobin A1c more than 10 recently but Monitor.  Hypertension;  Chronic diastolic heart failure:  Euvolemic.  Continue current regimen for now.  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   Chronic pain syndrome. Patient is on Flexeril, Lyrica, cetirizine, Aricept, Prozac, Norco. Has recurrent presentation with confusion. Concern is that the patient is accumulating his pills and taking them later on his own terms as a larger dose. UDS positive for opioids and TCA (Flexeril can give false positive for TCA) For now holding all psychotropic medications and monitoring the patient.   Subjective: No acute complaint.  No nausea no  vomiting no fever no chills.  Mentation improving back to baseline.  Physical Exam: General: in Mild distress, No Rash Cardiovascular: S1 and S2 Present, No Murmur Respiratory: Good respiratory effort, Bilateral Air entry present. No Crackles, No wheezes Abdomen: Bowel Sound present, No tenderness Extremities: Trace edema Neuro: Alert and oriented x3, no new focal deficit  Data Reviewed: I have Reviewed nursing notes, Vitals, and Lab results. Since last encounter, pertinent lab results CBC and BMP   . I have ordered test including CBC and BMP  .   Disposition: Status is: Observation   Family Communication: Discussed with sister on the phone. Level of care: Telemetry Medical   Vitals:   03/18/23 2200 03/18/23 2300 03/19/23 0454 03/19/23 0749  BP:  (!) 130/58 (!) 143/59 (!) 147/70  Pulse: 62 (!) 59 (!) 57 (!) 56  Resp: 18 18 17 18   Temp:  (!) 97.5 F (36.4 C) (!) 97.4 F (36.3 C) 98.3 F (36.8 C)  TempSrc:  Oral Oral   SpO2: 95% 96% 95% 95%  Weight:  130.7 kg    Height:  6\' 2"  (1.88 m)       Author: Lynden Oxford, MD 03/19/2023 6:31 PM  Please look on [email or web address] to find out who is on call.

## 2023-03-19 NOTE — Progress Notes (Signed)
Patient placed on CPAP, tolerating well.

## 2023-03-19 NOTE — Hospital Course (Addendum)
PMH of obesity, OSA, GERD, COPD, depression, type II DM, CHF, HTN, hypothyroidism presents to the hospital with complaints of encephalopathy. Had a similar prior presentation at which time it was thought to be secondary to patient's surreptitious use of narcotics causing his admission with confusion episode. Per family concern is that the patient might be requesting his as needed medications at the SNF even if he does not needed and will be collecting it to be used at a later date and will use multiple doses at the same time.  Reportedly he did that with Benadryl earlier this year.

## 2023-03-19 NOTE — Evaluation (Signed)
Physical Therapy Evaluation Patient Details Name: [name] [name] MRN: [id number] DOB: [birth year] Today's Date: 03/19/2023  History of Present Illness  presented to ER and admitted for work up related ot AMS, decreased responsiveness and lethargy.  Clinical Impression  Patient resting in bed upon arrival to session; alert and oriented to basic information.  Follows simple commands, pleasant and cooperative throughout session.  Denies pain; does endorse generalized weakness and needing to be "cleaned up" during session.  Bilat UE/LE strength and ROM grossly symmetrical and WFL; no focal weakness appreciated.  Currently requiring mod assist with heavy use of bedrails for rolling bilat.  Fair/good movement of extremities, but limited functional strength necessary to complete full movement transitions.  Dep assist for hygiene, peri-care, gown and linen change during session (due to incontinence); additional mobility deferred, as EEG tech present for transport.  Will continue to assess/progress in subsequent sessions as appropriate. Would benefit from skilled PT to address above deficits and promote optimal return to PLOF; recommend post-acute rehab services as determined by interdisciplinary care team.       Recommendations for follow up therapy are one component of a multi-disciplinary discharge planning process, led by the attending physician.  Recommendations may be updated based on patient status, additional functional criteria and insurance authorization.  Follow Up Recommendations Can patient physically be transported by private vehicle: No     Assistance Recommended at Discharge    Patient can return home with the following  Two people to help with walking and/or transfers;Two people to help with bathing/dressing/bathroom    Equipment Recommendations    Recommendations for Other Services       Functional Status Assessment Patient has had a recent decline in their functional status and  demonstrates the ability to make significant improvements in function in a reasonable and predictable amount of time.     Precautions / Restrictions Precautions Precautions: Fall Restrictions Weight Bearing Restrictions: No      Mobility  Bed Mobility Overal bed mobility: Needs Assistance Bed Mobility: Rolling Rolling: Mod assist         General bed mobility comments: mod assist with heavy use of bedrails for full rotation with rolling    Transfers                   General transfer comment: deferred due to bowel/bladder incontinence and EEG tech present for transport end of session    Ambulation/Gait               General Gait Details: primarily WC level at baseline  Stairs            Wheelchair Mobility    Modified Rankin (Stroke Patients Only)       Balance                                             Pertinent Vitals/Pain Pain Assessment Pain Assessment: No/denies pain    Home Living Family/patient expects to be discharged to:: Skilled nursing facility                   Additional Comments: Remains living at [facility] and receiving rehab.    Prior Function Prior Level of Function : Needs assist             Mobility Comments: Normally ambulating short distances up until 2 weeks ago. Typically  SPT to RW but has been reliant on 2 person assist over recent weeks ADLs Comments: Pt states that he has been toileting at bed level, receives assistance for all ADLs except eating. States that he enjoys going antiquing, but hasn't been able to do that for approx 2 years.     Hand Dominance   Dominant Hand: Right    Extremity/Trunk Assessment   Upper Extremity Assessment Upper Extremity Assessment: Overall WFL for tasks assessed    Lower Extremity Assessment Lower Extremity Assessment: Overall WFL for tasks assessed (grossly at least 4-/5 throughout)       Communication   Communication: No  difficulties  Cognition   Behavior During Therapy: WFL for tasks assessed/performed Overall Cognitive Status: No family/caregiver present to determine baseline cognitive functioning                                 General Comments: Oriented to self, location, general situation; effectively communicates wants/needs; follows simple commands, pleasant and cooperative        General Comments      Exercises Other Exercises Other Exercises: Rolling bilat, mod assist +1-2; heavy use of bedrails (with hand-over-hand for UE placement).  Dep assist for hygiene, linen change and skin care after incontinet bowel/bladder   Assessment/Plan    PT Assessment Patient needs continued PT services  PT Problem List Decreased activity tolerance;Decreased mobility;Decreased coordination;Decreased balance;Decreased cognition;Decreased knowledge of use of DME;Decreased safety awareness;Decreased knowledge of precautions;Obesity       PT Treatment Interventions DME instruction;Functional mobility training;Therapeutic exercise;Therapeutic activities;Balance training;Cognitive remediation;Patient/family education    PT Goals (Current goals can be found in the Care Plan section)  Acute Rehab PT Goals Patient Stated Goal: to get cleaned up and dry PT Goal Formulation: With patient Time For Goal Achievement: 04/02/23 Potential to Achieve Goals: Fair    Frequency Min 2X/week     Co-evaluation               AM-PAC PT "6 Clicks" Mobility  Outcome Measure Help needed turning from your back to your side while in a flat bed without using bedrails?: A Lot Help needed moving from lying on your back to sitting on the side of a flat bed without using bedrails?: A Lot Help needed moving to and from a bed to a chair (including a wheelchair)?: A Lot Help needed standing up from a chair using your arms (e.g., wheelchair or bedside chair)?: A Lot Help needed to walk in hospital room?: Total Help  needed climbing 3-5 steps with a railing? : Total 6 Click Score: 10    End of Session   Activity Tolerance:  (limited by bowel/bladder incontinence; EEG tech present for procedure end of session) Patient left: with call bell/phone within reach;in bed (EEG tech present for transport end of sesion)   PT Visit Diagnosis: Muscle weakness (generalized) (M62.81)    Time: 1120-1140 PT Time Calculation (min) (ACUTE ONLY): 20 min   Charges:   PT Evaluation $PT Eval Moderate Complexity: 1 Mod        Amilio Zehnder H. Manson Passey, PT, DPT, NCS 03/19/23, 10:25 PM [phone]

## 2023-03-19 NOTE — Progress Notes (Signed)
Eeg done 

## 2023-03-19 NOTE — Progress Notes (Deleted)
Refusing IVF at this time due to continuous beeping of pump. Encouraged to keep arm straight (site in [facility]).. Patient stated, "Just stop it right now! I'm not keeping my arm straight!" Offered to place a new IV site, but patient stated, "I ain't doing that right now either!"

## 2023-03-19 NOTE — Evaluation (Addendum)
Clinical/Bedside Swallow Evaluation Patient Details  Name: [name] [name] MRN: [id number] Date of Birth: Apr 03, 1946  Today's Date: 03/19/2023 Time: SLP Start Time (ACUTE ONLY): 0847 SLP Stop Time (ACUTE ONLY): 0902 SLP Time Calculation (min) (ACUTE ONLY): 15 min  Past Medical History:  Past Medical History:  Diagnosis Date   Anxiety disorder 05/05/2014   unspecifed   Arthritis    CHF (congestive heart failure) (HCC)    Colonic polyp    COPD (chronic obstructive pulmonary disease) (HCC)    Depression    Diabetes mellitus without complication (HCC)    Dysphagia    Dyspnea    Elevated prostate specific antigen (PSA)    Essential (primary) hypertension 05/05/2014   GERD (gastroesophageal reflux disease)    GI bleed    due to diverticulitis    Heart murmur    hx of 1960s   Hemorrhoids    History of thyroid cancer 05/05/2014   Hypercholesteremia    Hyperlipidemia    Hypertension    Hypothyroidism 05/05/2014   Male erectile dysfunction 12/29/2012   Malignant neoplasm of prostate (HCC)    10/30/2011 T1c - Identified by needle biopsy . Gleason 7 (3+4) 5 of 12 cores, Bilateral    Pneumonia    Prediabetes 06/22/2017   Sleep apnea    CPAP , report on chart , not used in last 2 weeks    Urinary obstruction    not elsewhere classified   Past Surgical History:  Past Surgical History:  Procedure Laterality Date   BACK SURGERY     lumbar 1987    BACK SURGERY     C-6- Plate&Pin   CATARACT EXTRACTION W/PHACO Right 09/15/2018   Procedure: CATARACT EXTRACTION PHACO AND INTRAOCULAR LENS PLACEMENT (IOC) RIGHT;  Surgeon: Lockie Mola, MD;  Location: [facility];  Service: Ophthalmology;  Laterality: Right;  Requests to be last case   CATARACT EXTRACTION W/PHACO Left 10/06/2018   Procedure: CATARACT EXTRACTION PHACO AND INTRAOCULAR LENS PLACEMENT (IOC)  LEFT;  Surgeon: Lockie Mola, MD;  Location: [facility];  Service: Ophthalmology;  Laterality: Left;    CERVICAL DISC SURGERY     COLONOSCOPY     10/06/2011, 06/05/2006, 05/06/2002 adematous polyps: CBF 09/2014; Recall Ltr mailed 02/27/2015 (dw)   COLONOSCOPY WITH PROPOFOL N/A 07/06/2015   Procedure: COLONOSCOPY WITH PROPOFOL;  Surgeon: Scot Jun, MD;  Location: [facility];  Service: Endoscopy;  Laterality: N/A;   COLONOSCOPY WITH PROPOFOL N/A 09/01/2018   Procedure: COLONOSCOPY WITH PROPOFOL;  Surgeon: Scot Jun, MD;  Location: [facility] END[facility];  Service: Endoscopy;  Laterality: N/A;   COLONOSCOPY WITH PROPOFOL N/A 04/17/2020   Procedure: COLONOSCOPY WITH PROPOFOL;  Surgeon: Midge Minium, MD;  Location: [facility] ENDOSCOPY;  Service: Endoscopy;  Laterality: N/A;   ESOPHAGOGASTRODUODENOSCOPY (EGD) WITH PROPOFOL N/A 04/17/2020   Procedure: ESOPHAGOGASTRODUODENOSCOPY (EGD) WITH PROPOFOL;  Surgeon: Midge Minium, MD;  Location: [facility] ENDOSCOPY;  Service: Endoscopy;  Laterality: N/A;   EYE SURGERY     HERNIA REPAIR     umbilical hernia repair    LAMINECTOMY FOR EXCISION / EVACUATION INTRASPINAL LESION     lumbar   OTHER SURGICAL HISTORY     right rotator cuff surgery    OTHER SURGICAL HISTORY     bilateral tubes in ears    POSTERIOR CERVICAL LAMINECTOMY N/A 10/27/2020   Procedure: POSTERIOR CERVICAL LAMINECTOMY CERVICAL THREE-CERVICAL SIX;  Surgeon: Jadene Pierini, MD;  Location: [facility] OR;  Service: Neurosurgery;  Laterality: N/A;   POSTERIOR LAMINECTOMY / DECOMPRESSION CERVICAL SPINE  PROSTATE BIOPSY     10/22/2011 Volume:55.8 cc's, PSA:4.5, Free PSA:12%   ROBOT ASSISTED LAPAROSCOPIC RADICAL PROSTATECTOMY  12/15/2011   Procedure: ROBOTIC ASSISTED LAPAROSCOPIC RADICAL PROSTATECTOMY LEVEL 2;  Surgeon: Crecencio Mc, MD;  Location: [facility] ORS;  Service: Urology;  Laterality: N/A;       ROTATOR CUFF REPAIR Right    SHOULDER ARTHROSCOPY WITH SUBACROMIAL DECOMPRESSION, ROTATOR CUFF REPAIR AND BICEP TENDON REPAIR Left 11/07/2021   Procedure: SHOULDER ARTHROSCOPY WITH DEBRIDEMENT, DECOMPRESSION,  ROTATOR CUFF REPAIR AND BICEPS TENODESIS.;  Surgeon: Christena Flake, MD;  Location: [facility] ORS;  Service: Orthopedics;  Laterality: Left;   TONSILLECTOMY     TOTAL THYROIDECTOMY     HPI:  Per H&P " [name] [name] is a 77 y.o. male with medical history significant of episodes of altered mental status that have been previously investigated which manifest as somnolence and not responding.  Patient was in his usual state of health till earlier today when he was at his skilled nursing facility and was found to be not responding and lethargic.  This is history obtained from secondary sources as patient does not recall why he is in the hospital.  Patient has documented dementia in the chart.  Patient was sent to [facility] accordingly.  ER course is notable for the patient having persistent somnolence.  Workup being mostly negative.  Patient having poor response to Narcan.  Patient has however since then woken up medical evaluation is sought." CXR, 03/18/23, "Low lung volumes with possible mild superimposed pulmonary venous  congestion."    Assessment / Plan / Recommendation  Clinical Impression  Pt seen for clinical swallowing evaluation. Pt alert. Encouragement needed for POs. Confusion noted; however, pt able to be redirected. Noted EGD scheduled for June 2024. Hx GERD and GIB noted. Pt denies difficulty swallowing. Pt given trials of solid, pureed, and thin liquids (via straw). Pt with mildly prolonged, but functiona, mastication and trace lingual residual with solid which cleared with thin liquid wash. No overt s/sx pharyngeal dysphagia noted. Recommend a regular consistency diet with thin liquids with safe swallowing swallowing strategies as outlined below. SLP to f/u x1 for diet tolerance. Pt and RN made aware of results, recommendations, and SLP POC. Pt verbalized understanding, likely need for reinforcement of content. SLP Visit Diagnosis: Dysphagia, oral phase (R13.11)    Aspiration Risk  Mild aspiration risk     Diet Recommendation Regular;Thin liquid (well cut meats)   Liquid Administration via: Spoon;Cup;Straw Medication Administration: Whole meds with puree (vs crushed) Supervision: Patient able to self feed Compensations: Minimize environmental distractions;Slow rate;Small sips/bites;Follow solids with liquid Postural Changes: Seated upright at 90 degrees;Remain upright for at least 30 minutes after po intake    Other  Recommendations Recommended Consults: Consider GI evaluation (noted plan for EGD as outpatient) Oral Care Recommendations: Oral care QID    Recommendations for follow up therapy are one component of a multi-disciplinary discharge planning process, led by the attending physician.  Recommendations may be updated based on patient status, additional functional criteria and insurance authorization.  Follow up Recommendations  (TBD)         Functional Status Assessment Patient has had a recent decline in their functional status and demonstrates the ability to make significant improvements in function in a reasonable and predictable amount of time.  Frequency and Duration min 1 x/week  1 week       Prognosis Prognosis for improved oropharyngeal function: Fair Barriers to Reach Goals: Cognitive deficits;Behavior  Swallow Study   General Date of Onset: 03/18/23 HPI: Per H&P " [name] [name] is a 77 y.o. male with medical history significant of episodes of altered mental status that have been previously investigated which manifest as somnolence and not responding.  Patient was in his usual state of health till earlier today when he was at his skilled nursing facility and was found to be not responding and lethargic.  This is history obtained from secondary sources as patient does not recall why he is in the hospital.  Patient has documented dementia in the chart.  Patient was sent to [facility] accordingly.  ER course is notable for the patient having persistent somnolence.  Workup  being mostly negative.  Patient having poor response to Narcan.  Patient has however since then woken up medical evaluation is sought." CXR, 03/18/23, "Low lung volumes with possible mild superimposed pulmonary venous  congestion." Type of Study: Bedside Swallow Evaluation Previous Swallow Assessment: last seen 02/06/23 with recommendation for regular diet with thin liquids Diet Prior to this Study: NPO Temperature Spikes Noted: No Respiratory Status: Room air History of Recent Intubation: No Behavior/Cognition: Alert;Confused;Uncooperative Oral Cavity Assessment: Within Functional Limits Oral Care Completed by SLP: Recent completion by staff Oral Cavity - Dentition: Adequate natural dentition;Poor condition Vision: Functional for self-feeding Self-Feeding Abilities: Able to feed self Patient Positioning: Upright in bed Baseline Vocal Quality: Normal Volitional Cough: Strong Volitional Swallow: Able to elicit    Oral/Motor/Sensory Function     Ice Chips Ice chips: Not tested   Thin Liquid Thin Liquid: Within functional limits Presentation: Straw    Nectar Thick Nectar Thick Liquid: Not tested   Honey Thick Honey Thick Liquid: Not tested   Puree Puree: Within functional limits Presentation: Self Fed   Solid     Solid: Impaired Presentation: Self Fed Oral Phase Impairments: Impaired mastication Oral Phase Functional Implications: Oral residue Pharyngeal Phase Impairments:  (WFL) Other Comments: mildly prolonged mastication and trace oral residual which cleared with liquid wash     Clyde Canterbury, M.S., CCC-SLP Speech-Language Pathologist [facility] [phone] (ASCOM)  [name] [name] 03/19/2023,10:54 AM

## 2023-03-20 DIAGNOSIS — G9340 Encephalopathy, unspecified: Secondary | ICD-10-CM | POA: Diagnosis not present

## 2023-03-20 LAB — GLUCOSE, CAPILLARY
Glucose-Capillary: 165 mg/dL — ABNORMAL HIGH (ref 70–99)
Glucose-Capillary: 135 mg/dL — ABNORMAL HIGH (ref 70–99)
Glucose-Capillary: 124 mg/dL — ABNORMAL HIGH (ref 70–99)
Glucose-Capillary: 125 mg/dL — ABNORMAL HIGH (ref 70–99)

## 2023-03-20 LAB — THYROID PANEL WITH TSH
Free Thyroxine Index: 1.9 (ref 1.2–4.9)
T3 Uptake Ratio: 32 % (ref 24–39)
T4, Total: 5.9 ug/dL (ref 4.5–12.0)
TSH: 4.06 u[IU]/mL (ref 0.450–4.500)

## 2023-03-20 MED ORDER — CYCLOBENZAPRINE HCL 10 MG PO TABS
5.0000 mg | ORAL_TABLET | Freq: Three times a day (TID) | ORAL | Status: DC | PRN
  Administered 2023-03-20: 5 mg via ORAL
  Filled 2023-03-20: qty 1

## 2023-03-20 MED ORDER — CAMPHOR-MENTHOL 0.5-0.5 % EX LOTN: TOPICAL_LOTION | CUTANEOUS | Status: DC | PRN

## 2023-03-20 MED ORDER — ONDANSETRON HCL 4 MG/2ML IJ SOLN
4.0000 mg | Freq: Four times a day (QID) | INTRAMUSCULAR | Status: DC | PRN
  Administered 2023-03-20: 4 mg via INTRAVENOUS
  Filled 2023-03-20: qty 2

## 2023-03-20 MED ORDER — HYDRALAZINE HCL 20 MG/ML IJ SOLN: 10.0000 mg | INTRAMUSCULAR | Status: DC | PRN

## 2023-03-20 MED ORDER — PREGABALIN 75 MG PO CAPS
300.0000 mg | ORAL_CAPSULE | Freq: Two times a day (BID) | ORAL | Status: DC
  Administered 2023-03-20 – 2023-03-24 (×8): 300 mg via ORAL
  Filled 2023-03-20 (×8): qty 4

## 2023-03-20 NOTE — TOC Progression Note (Signed)
Transition of Care [facility]) - Progression Note    Patient Details  Name: [name] [name] MRN: [id number] Date of Birth: 30-Mar-1946  [facility] CM/SW Contact  Allena Katz, LCSW Phone Number: 03/20/2023, 9:23 AM  Clinical Narrative:   TOC attempted to deliver MOON but patient asleep and unable to reach patients son.          Expected Discharge Plan and Services                                               Social Determinants of Health (SDOH) Interventions SDOH Screenings   Food Insecurity: No Food Insecurity (03/19/2023)  Housing: Low Risk  (03/19/2023)  Transportation Needs: No Transportation Needs (03/19/2023)  Utilities: Not At Risk (03/19/2023)  Tobacco Use: Medium Risk (03/19/2023)    Readmission Risk Interventions     No data to display

## 2023-03-20 NOTE — Progress Notes (Signed)
[facility] Progress Note Patient: [name] [name] WUJ:[id number] DOB: December 15, 1945 DOA: 03/18/2023  DOS: the patient was seen and examined on 03/20/2023  Brief hospital course: PMH of obesity, OSA, GERD, COPD, depression, type II DM, CHF, HTN, hypothyroidism presents to the hospital with complaints of encephalopathy. Had a similar prior presentation at which time it was thought to be secondary to patient's surreptitious use of narcotics causing his admission with confusion episode. Per family concern is that the patient might be requesting his as needed medications at the SNF even if he does not needed and will be collecting it to be used at a later date and will use multiple doses at the same time.  Reportedly he did that with Benadryl earlier this year. Assessment and Plan: Acute metabolic encephalopathy. Presents with acute confusional episode. CT of the head unremarkable for any acute abnormality. VBG unremarkable. Mild evidence of AKI. Treated with IV fluid. Had a similar presentation earlier at which time he was thought to be having issues with polypharmacy. Currently no deficit.  No concerns for seizures. In March 2024 MRI brain negative for acute stroke with similar presentation. Suspecting untreated sleep apnea therefore will provide CPAP nightly. Patient's home regimen included Flexeril, Lyrica, Requip, Norco. Currently will resume 5 mg every 8 hours as needed Flexeril and home regimen of Lyrica.  Already on Requip.  Continue to hold narcotics.  Will discontinue them on discharge.   AKI Serum creatinine 0.9.  On admission serum creatinine is at 1.2. Treated with IV fluid.  Remaining stable after fluid.  Hypothyroidism:  Tsh WNL. Marland Kitchen Resume synthroid 150 mcg daily.    Hyperlipidemia:  Continue with Lipitor 40 mg daily.    COPD:  No wheezing heard.  Continue with dulera and albuterol prn.   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 Currently on basal bolus regimen. Hemoglobin A1c more than 10 recently but Monitor.  Hypertension;  Chronic diastolic heart failure: Possible hypotension. Euvolemic.  Patient is on midodrine although blood pressure appears to be elevated right now therefore we will discontinue this medication and monitor.  Hypotension could be attributed to his psychotropic medications.  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   Chronic pain syndrome. Patient is on Flexeril, Lyrica, cetirizine, Aricept, Prozac, Norco. Has recurrent presentation with confusion. Concern is that the patient is accumulating his pills and taking them later on his own terms as a larger dose. UDS positive for opioids and TCA (Flexeril can give false positive for TCA) For now continuing only Aricept, Prozac, low-dose Flexeril and home regimen of Lyrica.  OSA. Patient consistent with using CPAP here in the hospital. Unsure if he is consistent with that outside of the hospital but certainly can lead to encephalopathy. At the time of admission VBG did not show severe hypercarbia. Currently tolerating CPAP well. Recommend to continue outpatient CPAP at the facility.   Subjective: No acute events.  Complains of bilateral leg pain specifically in the foot area consistent with his neuropathy.  No chest pain.  No abdominal pain..  No nausea no vomiting  Physical Exam: In mild distress. Clear to auscultation. S1-S2 present. Bilateral trace edema present. Tender to touch on lower extremities. Alert awake and oriented x 3. No asterixis. No focal deficit.  Data Reviewed: I have Reviewed nursing notes, Vitals, and Lab results. Reviewed CBG, reordered CBC and BMP.  Disposition: Status is: Observation Patient is to be observed in the hospital after initiation of his home medications to  ensure stability of his mentation.   Family Communication: Discussed with sister on the phone on 5/2. Level of care: Telemetry Medical    Vitals:   03/20/23 0738 03/20/23 1200 03/20/23 1601 03/20/23 1644  BP: (!) 158/71 (!) 164/79 (!) 191/89 (!) 170/80  Pulse: (!) 56 (!) 58 (!) 57 60  Resp: 18 18 16    Temp: (!) 97.5 F (36.4 C) 98 F (36.7 C) 97.8 F (36.6 C)   TempSrc:  Oral    SpO2: 98% 99% 96%   Weight:      Height:         Author: Lynden Oxford, MD 03/20/2023 5:08 PM  Please look on [email or web address] to find out who is on call.

## 2023-03-20 NOTE — Progress Notes (Addendum)
Speech Language Pathology Treatment: Dysphagia  Patient Details Name: OLAWALE [name] MRN: [id number] DOB: November 12, 1946 Today's Date: 03/20/2023 Time: 1710-1740 SLP Time Calculation (min) (ACUTE ONLY): 30 min  Assessment / Plan / Recommendation Clinical Impression  Pt seen for ongoing assessment of swallowing and toleration of regular diet. At this session, pt stated: "Nothing seems good at all" when asked about eating his dinner meal pt stated he was in healthcare for "20 years and i know what you are saying is true, but i just don't want anything", when asked about eating his meal. Noted 2-3 bites had been taken at the Lunch meal from tray on the table.  Pt was encouraged then agreed to bites/sips w/ this SLP. He was positioned upright in bed and given setup to feed himself -- liquids via Cup.  Pt is on RA post CPAP while sleeping; afebrile.   Pt appears to present w/ functional oropharyngeal phase swallowing w/ No oropharyngeal phase dysphagia noted, No neuromuscular deficits noted. Pt consumed po trials w/ No overt, clinical s/s of aspiration during po trials.  Pt appears at reduced risk for aspiration following general aspiration precautions.   However, pt does have challenging factors that could impact oropharyngeal swallowing to include fatigue/weakness, deconditioning, and decreased desire for oral intake/self-feeding. Pt also has a Hx of GERD and GIB noted. These factors can increase risk for dysphagia/aspiration as well as decreased oral intake overall.   During po trials, pt consumed consistencies w/ no overt coughing, decline in vocal quality, or change in respiratory presentation during/post trials. Oral phase appeared [facility] w/ timely bolus management, mastication(solid x1), and control of bolus propulsion for A-P transfer for swallowing. Oral clearing achieved w/ all trial consistencies -- moistened, soft foods given. Pt consumed mostly water via Cup and 3-4 tsps of ice cream. Encouraged and  offered other foods/liquids but pt declined.  Pt fed self w/ setup support.   Recommend continue a Regular consistency diet(for offer of choices) w/ well-Cut meats, moistened foods; Thin liquids -- best by CUP for control during drinking. Pt to feed self as much as possible -- increases safety w/ oral intake. Recommend general aspiration and REFLUX precautions, tray setup at meals. Pills WHOLE in Puree for safer, easier swallowing as needed.  Education given on Pills in Puree; food consistencies and easy to eat options; general aspiration and REFLUX precautions to pt and Sister who arrived. MD updated. NSG to reconsult if any new needs arise. Recommend Dietician f/u for support. ST services to sign off at this time.       HPI HPI: Per H&P " [name] [name] is a 77 y.o. male with medical history significant of episodes of altered mental status that have been previously investigated which manifest as somnolence and not responding.  Patient was in his usual state of health till earlier today when he was at his skilled nursing facility and was found to be not responding and lethargic.  This is history obtained from secondary sources as patient does not recall why he is in the hospital.  Patient has documented dementia in the chart.  Patient was sent to [facility] accordingly.  ER course is notable for the patient having persistent somnolence.  Workup being mostly negative.  Patient having poor response to Narcan.  Patient has however since then woken up medical evaluation is sought." CXR, 03/18/23, "Low lung volumes with possible mild superimposed pulmonary venous  congestion."      SLP Plan  All goals met  Recommendations for follow up therapy are one component of a multi-disciplinary discharge planning process, led by the attending physician.  Recommendations may be updated based on patient status, additional functional criteria and insurance authorization.    Recommendations  Diet recommendations:  Regular;Thin liquid (foods of pleasure) Liquids provided via: Cup Medication Administration: Whole meds with puree (if any difficulty w/ Pills w/ water (which he has been doing w/ NSG today)) Supervision: Patient able to self feed (setup) Compensations: Minimize environmental distractions;Slow rate;Small sips/bites;Follow solids with liquid Postural Changes and/or Swallow Maneuvers: Out of bed for meals;Seated upright 90 degrees;Upright 30-60 min after meal (Reflux precs)                 (Dietician f/u; Palliative Care for GOC) Oral care BID;Oral care before and after PO;Patient independent with oral care (setup support)   Set up Supervision/Assistance Dysphagia, unspecified (R13.10)     All goals met       Jerilynn Som, MS, CCC-SLP Speech Language Pathologist Rehab Services; [facility] [phone] (ascom) [name] [name]  03/20/2023, 5:48 PM

## 2023-03-21 DIAGNOSIS — Z79899 Other long term (current) drug therapy: Secondary | ICD-10-CM | POA: Diagnosis not present

## 2023-03-21 DIAGNOSIS — T50911A Poisoning by multiple unspecified drugs, medicaments and biological substances, accidental (unintentional), initial encounter: Secondary | ICD-10-CM | POA: Diagnosis present

## 2023-03-21 DIAGNOSIS — Z79890 Hormone replacement therapy (postmenopausal): Secondary | ICD-10-CM | POA: Diagnosis not present

## 2023-03-21 DIAGNOSIS — E559 Vitamin D deficiency, unspecified: Secondary | ICD-10-CM | POA: Diagnosis present

## 2023-03-21 DIAGNOSIS — N179 Acute kidney failure, unspecified: Secondary | ICD-10-CM | POA: Diagnosis present

## 2023-03-21 DIAGNOSIS — R4182 Altered mental status, unspecified: Secondary | ICD-10-CM | POA: Diagnosis present

## 2023-03-21 DIAGNOSIS — Z794 Long term (current) use of insulin: Secondary | ICD-10-CM | POA: Diagnosis not present

## 2023-03-21 DIAGNOSIS — E1165 Type 2 diabetes mellitus with hyperglycemia: Secondary | ICD-10-CM | POA: Diagnosis present

## 2023-03-21 DIAGNOSIS — G928 Other toxic encephalopathy: Secondary | ICD-10-CM | POA: Diagnosis present

## 2023-03-21 DIAGNOSIS — R410 Disorientation, unspecified: Secondary | ICD-10-CM | POA: Diagnosis not present

## 2023-03-21 DIAGNOSIS — E7800 Pure hypercholesterolemia, unspecified: Secondary | ICD-10-CM | POA: Diagnosis present

## 2023-03-21 DIAGNOSIS — E1140 Type 2 diabetes mellitus with diabetic neuropathy, unspecified: Secondary | ICD-10-CM | POA: Diagnosis present

## 2023-03-21 DIAGNOSIS — R7989 Other specified abnormal findings of blood chemistry: Secondary | ICD-10-CM

## 2023-03-21 DIAGNOSIS — G894 Chronic pain syndrome: Secondary | ICD-10-CM | POA: Diagnosis present

## 2023-03-21 DIAGNOSIS — E890 Postprocedural hypothyroidism: Secondary | ICD-10-CM | POA: Diagnosis present

## 2023-03-21 DIAGNOSIS — J449 Chronic obstructive pulmonary disease, unspecified: Secondary | ICD-10-CM | POA: Diagnosis present

## 2023-03-21 DIAGNOSIS — F32A Depression, unspecified: Secondary | ICD-10-CM | POA: Diagnosis present

## 2023-03-21 DIAGNOSIS — F419 Anxiety disorder, unspecified: Secondary | ICD-10-CM | POA: Diagnosis present

## 2023-03-21 DIAGNOSIS — Z6836 Body mass index (BMI) 36.0-36.9, adult: Secondary | ICD-10-CM | POA: Diagnosis not present

## 2023-03-21 DIAGNOSIS — F0390 Unspecified dementia without behavioral disturbance: Secondary | ICD-10-CM | POA: Diagnosis present

## 2023-03-21 DIAGNOSIS — K219 Gastro-esophageal reflux disease without esophagitis: Secondary | ICD-10-CM | POA: Diagnosis present

## 2023-03-21 DIAGNOSIS — I5032 Chronic diastolic (congestive) heart failure: Secondary | ICD-10-CM | POA: Diagnosis present

## 2023-03-21 DIAGNOSIS — I4510 Unspecified right bundle-branch block: Secondary | ICD-10-CM | POA: Diagnosis present

## 2023-03-21 DIAGNOSIS — E669 Obesity, unspecified: Secondary | ICD-10-CM | POA: Diagnosis present

## 2023-03-21 DIAGNOSIS — I110 Hypertensive heart disease with heart failure: Secondary | ICD-10-CM | POA: Diagnosis present

## 2023-03-21 DIAGNOSIS — Z8249 Family history of ischemic heart disease and other diseases of the circulatory system: Secondary | ICD-10-CM | POA: Diagnosis not present

## 2023-03-21 DIAGNOSIS — G4733 Obstructive sleep apnea (adult) (pediatric): Secondary | ICD-10-CM | POA: Diagnosis present

## 2023-03-21 LAB — CBC WITH DIFFERENTIAL/PLATELET
Abs Immature Granulocytes: 0.03 10*3/uL (ref 0.00–0.07)
Basophils Absolute: 0.1 10*3/uL (ref 0.0–0.1)
Basophils Relative: 1 %
Eosinophils Absolute: 0.3 10*3/uL (ref 0.0–0.5)
Eosinophils Relative: 5 %
HCT: 39.9 % (ref 39.0–52.0)
Hemoglobin: 12.7 g/dL — ABNORMAL LOW (ref 13.0–17.0)
Immature Granulocytes: 1 %
Lymphocytes Relative: 14 %
Lymphs Abs: 0.9 10*3/uL (ref 0.7–4.0)
MCH: 28 pg (ref 26.0–34.0)
MCHC: 31.8 g/dL (ref 30.0–36.0)
MCV: 87.9 fL (ref 80.0–100.0)
Monocytes Absolute: 0.6 10*3/uL (ref 0.1–1.0)
Monocytes Relative: 10 %
Neutro Abs: 4.5 10*3/uL (ref 1.7–7.7)
Neutrophils Relative %: 69 %
Platelets: 185 10*3/uL (ref 150–400)
RBC: 4.54 MIL/uL (ref 4.22–5.81)
RDW: 14.6 % (ref 11.5–15.5)
WBC: 6.4 10*3/uL (ref 4.0–10.5)
nRBC: 0 % (ref 0.0–0.2)

## 2023-03-21 LAB — COMPREHENSIVE METABOLIC PANEL
ALT: 19 U/L (ref 0–44)
AST: 19 U/L (ref 15–41)
Albumin: 3.2 g/dL — ABNORMAL LOW (ref 3.5–5.0)
Alkaline Phosphatase: 92 U/L (ref 38–126)
Anion gap: 8 (ref 5–15)
BUN: 12 mg/dL (ref 8–23)
CO2: 24 mmol/L (ref 22–32)
Calcium: 9 mg/dL (ref 8.9–10.3)
Chloride: 107 mmol/L (ref 98–111)
Creatinine, Ser: 0.81 mg/dL (ref 0.61–1.24)
GFR, Estimated: >60 mL/min (ref >60)
Glucose, Bld: 128 mg/dL — ABNORMAL HIGH (ref 70–99)
Potassium: 3.3 mmol/L — ABNORMAL LOW (ref 3.5–5.1)
Sodium: 139 mmol/L (ref 135–145)
Total Bilirubin: 1.5 mg/dL — ABNORMAL HIGH (ref 0.3–1.2)
Total Protein: 6.3 g/dL — ABNORMAL LOW (ref 6.5–8.1)

## 2023-03-21 LAB — MAGNESIUM: Magnesium: 2.1 mg/dL (ref 1.7–2.4)

## 2023-03-21 LAB — GLUCOSE, CAPILLARY
Glucose-Capillary: 131 mg/dL — ABNORMAL HIGH (ref 70–99)
Glucose-Capillary: 145 mg/dL — ABNORMAL HIGH (ref 70–99)
Glucose-Capillary: 157 mg/dL — ABNORMAL HIGH (ref 70–99)
Glucose-Capillary: 147 mg/dL — ABNORMAL HIGH (ref 70–99)

## 2023-03-21 MED ORDER — GLUCERNA SHAKE PO LIQD
237.0000 mL | Freq: Three times a day (TID) | ORAL | Status: DC
  Administered 2023-03-21 – 2023-03-23 (×7): 237 mL via ORAL

## 2023-03-21 MED ORDER — ADULT MULTIVITAMIN W/MINERALS CH
1.0000 | ORAL_TABLET | Freq: Every day | ORAL | Status: DC
  Administered 2023-03-22 – 2023-03-24 (×3): 1 via ORAL
  Filled 2023-03-21 (×3): qty 1

## 2023-03-21 NOTE — Progress Notes (Signed)
Physical Therapy Treatment Patient Details Name: KAMAL ULLERY MRN: [id number] DOB: May 16, 1946 Today's Date: 03/21/2023   History of Present Illness presented to ER and admitted for work up related ot AMS, decreased responsiveness and lethargy.    PT Comments    Pt progress with bed mobility (min A with HOB raised and bed rails), standing EOB with RW during functional hygiene (min guard to close supervision), and transferred from bed to recliner with RW (min guard).  Pt reports fatiguing quickly and increased knee pain with standing and continues activity tolerance is limited. Would benefit from skilled PT to address above deficits and promote optimal return to PLOF; recommend post-acute rehab services as determined by interdisciplinary care team.      Recommendations for follow up therapy are one component of a multi-disciplinary discharge planning process, led by the attending physician.  Recommendations may be updated based on patient status, additional functional criteria and insurance authorization.  Follow Up Recommendations  Can patient physically be transported by private vehicle: No    Assistance Recommended at Discharge Intermittent Supervision/Assistance  Patient can return home with the following A lot of help with walking and/or transfers;A lot of help with bathing/dressing/bathroom   Equipment Recommendations  Rolling walker (2 wheels)    Recommendations for Other Services       Precautions / Restrictions Precautions Precautions: Fall Restrictions Weight Bearing Restrictions: No     Mobility  Bed Mobility Overal bed mobility: Needs Assistance Bed Mobility: Sidelying to Sit, Supine to Sit   Sidelying to sit: Min assist, HOB elevated       General bed mobility comments: heavy use of bedrails for full rotation with rolling; Min A moving LEs EOB; pt reported LE pain/tenderness with touch.    Transfers Overall transfer level: Needs assistance Equipment used:  Rolling walker (2 wheels) Transfers: Bed to chair/wheelchair/BSC     Step pivot transfers: Min assist       General transfer comment: pt performed with safe handplacement, bed slightly elevated, increased knee pain when standing but no buckling.    Ambulation/Gait Ambulation/Gait assistance: Min assist Gait Distance (Feet): 5 Feet Assistive device: Rolling walker (2 wheels) Gait Pattern/deviations: Step-to pattern       General Gait Details: pt able to step around to recliner, steady overall with RW.   Stairs             Wheelchair Mobility    Modified Rankin (Stroke Patients Only)       Balance Overall balance assessment: Needs assistance Sitting-balance support: Single extremity supported, Feet supported Sitting balance-Leahy Scale: Good Sitting balance - Comments: Posterior lean with LAQ exercise but pt self corrected.   Standing balance support: Bilateral upper extremity supported, Reliant on assistive device for balance Standing balance-Leahy Scale: Fair Standing balance comment: pt able to stand with RW with close supervision for hygiene care.                            Cognition Arousal/Alertness: Awake/alert Behavior During Therapy: WFL for tasks assessed/performed Overall Cognitive Status: No family/caregiver present to determine baseline cognitive functioning                                 General Comments: effectively communicates wants/needs; follows simple commands, pleasant and cooperative        Exercises Total Joint Exercises Long Arc Quad: AROM, Both, 10 reps Marching  in Standing: Seated, Both, 10 reps    General Comments        Pertinent Vitals/Pain Pain Assessment Pain Assessment: Faces Faces Pain Scale: Hurts even more Pain Location: Knees when standing. Pain Descriptors / Indicators: Aching Pain Intervention(s): Limited activity within patient's tolerance, Monitored during session    Home Living                           Prior Function            PT Goals (current goals can now be found in the care plan section) Acute Rehab PT Goals Patient Stated Goal: to get cleaned up and dry PT Goal Formulation: With patient Time For Goal Achievement: 04/02/23 Potential to Achieve Goals: Fair Progress towards PT goals: Progressing toward goals    Frequency    Min 2X/week      PT Plan Current plan remains appropriate    Co-evaluation              AM-PAC PT "6 Clicks" Mobility   Outcome Measure  Help needed turning from your back to your side while in a flat bed without using bedrails?: A Lot Help needed moving from lying on your back to sitting on the side of a flat bed without using bedrails?: A Lot Help needed moving to and from a bed to a chair (including a wheelchair)?: A Little Help needed standing up from a chair using your arms (e.g., wheelchair or bedside chair)?: A Little Help needed to walk in hospital room?: A Lot Help needed climbing 3-5 steps with a railing? : Total 6 Click Score: 13    End of Session Equipment Utilized During Treatment: Gait belt   Patient left: with call bell/phone within reach;in chair;with chair alarm set;with nursing/sitter in room   PT Visit Diagnosis: Muscle weakness (generalized) (M62.81)     Time: 1110-1130 PT Time Calculation (min) (ACUTE ONLY): 20 min  Charges:  $Therapeutic Activity: 8-22 mins                     Hortencia Conradi, PTA  03/21/23, 12:56 PM

## 2023-03-21 NOTE — Progress Notes (Signed)
Initial Nutrition Assessment  DOCUMENTATION CODES:   Obesity unspecified  INTERVENTION:   -MVI with minerals daily -Glucerna Shake po TID, each supplement provides 220 kcal and 10 grams of protein   NUTRITION DIAGNOSIS:   Inadequate oral intake related to poor appetite as evidenced by per patient/family report.  GOAL:   Patient will meet greater than or equal to 90% of their needs  MONITOR:   PO intake, Supplement acceptance  REASON FOR ASSESSMENT:   Consult Assessment of nutrition requirement/status, Poor PO  ASSESSMENT:   Pt with PMH of obesity, OSA, GERD, COPD, depression, type II DM, CHF, HTN, hypothyroidism presents to the hospital with complaints of encephalopathy.  Pt admitted with acute metabolic encephalopathy.   5/4- s/p BSE- regular consistency diet  Reviewed I/O's: +260 ml x 24 hours  Pt unavailable at time of visit. Attempted to speak with pt via call to hospital room phone, however, unable to reach. RD unable to obtain further nutrition-related history or complete nutrition-focused physical exam at this time.    Per SLP, pt complains of not eating due to nothing appealing to him. Noted meal completions 0-100%.    Reviewed wt hx; pt has experienced a 3.1% wt loss over the past 4 months, which is not significant for time frame.   Medications reviewed and include magnesium oxide.   Lab Results  Component Value Date   HGBA1C 10.4 (H) 02/04/2023   PTA DM medications are 10 units insulin lispro TID, 30 units insulin glarfgine-yfgn daily, and 08-999 mg sitagliptin-metformin daily.   Labs reviewed: K: 3.3, CBGS: 131-145 (inpatient orders for glycemic control are 0-15 units insulin aspart TID with meals, 0-5 units insulin aspart daily at bedtime, and 30 units insulin glargine-yfgn daily).    Diet Order:   Diet Order             Diet Carb Modified Fluid consistency: Thin; Room service appropriate? Yes  Diet effective now                    EDUCATION NEEDS:   No education needs have been identified at this time  Skin:  Skin Assessment: Reviewed RN Assessment  Last BM:  03/20/23 (type 7)  Height:   Ht Readings from Last 1 Encounters:  03/18/23 6\' 2"  (1.88 m)    Weight:   Wt Readings from Last 1 Encounters:  03/18/23 130.7 kg    Ideal Body Weight:  86.4 kg  BMI:  Body mass index is 36.99 kg/m.  Estimated Nutritional Needs:   Kcal:  2150-2350  Protein:  105-120 grams  Fluid:  > 2 L    Levada Schilling, RD, LDN, CDCES Registered Dietitian II Certified Diabetes Care and Education Specialist Please refer to [facility] for RD and/or RD on-call/weekend/after hours pager

## 2023-03-21 NOTE — Progress Notes (Signed)
PROGRESS NOTE  [name] [name] QMV:[id number] DOB: 06/02/1946 DOA: 03/18/2023 PCP: Sherol Dade, DO  Brief History   PMH of obesity, OSA, GERD, COPD, depression, type II DM, CHF, HTN, hypothyroidism presents to the hospital with complaints of encephalopathy. Had a similar prior presentation at which time it was thought to be secondary to patient's surreptitious use of narcotics causing his admission with confusion episode. Per family concern is that the patient might be requesting his as needed medications at the SNF even if he does not needed and will be collecting it to be used at a later date and will use multiple doses at the same time.  Reportedly he did that with Benadryl earlier this year.  The patient remains somnolent.  A & P  Acute metabolic encephalopathy. Presents with acute confusional episode. CT of the head unremarkable for any acute abnormality. VBG unremarkable. Mild evidence of AKI. Treated with IV fluid. Had a similar presentation earlier at which time he was thought to be having issues with polypharmacy. Currently no deficit.  No concerns for seizures. In March 2024 MRI brain negative for acute stroke with similar presentation. Suspecting untreated sleep apnea therefore will provide CPAP nightly. Patient's home regimen included Flexeril, Lyrica, Requip, Norco. Currently will resume 5 mg every 8 hours as needed Flexeril and home regimen of Lyrica.  Already on Requip.  Continue to hold narcotics.  Will discontinue them on discharge.   AKI Serum creatinine 0.81.  On admission serum creatinine is at 1.2. Treated with IV fluid.  Remaining stable after fluid.   Hypothyroidism:  Tsh WNL. Marland Kitchen Resume synthroid 150 mcg daily.    Hyperlipidemia:  Continue with Lipitor 40 mg daily.    COPD:  No wheezing heard.  Continue with dulera and albuterol prn.    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 Currently on  basal bolus regimen. Hemoglobin A1c more than 10 recently but Monitor.   Hypertension;  Chronic diastolic heart failure: Possible hypotension. Euvolemic.  Patient is on midodrine although blood pressure appears to be elevated right now therefore we will discontinue this medication and monitor.  Hypotension could be attributed to his psychotropic medications.   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    Chronic pain syndrome. Patient is on Flexeril, Lyrica, cetirizine, Aricept, Prozac, Norco. Has recurrent presentation with confusion. Concern is that the patient is accumulating his pills and taking them later on his own terms as a larger dose. UDS positive for opioids and TCA (Flexeril can give false positive for TCA) For now continuing only Aricept, Prozac, low-dose Flexeril and home regimen of Lyrica.   OSA. Patient consistent with using CPAP here in the hospital. Unsure if he is consistent with that outside of the hospital but certainly can lead to encephalopathy. At the time of admission VBG did not show severe hypercarbia. Currently tolerating CPAP well. Recommend to continue outpatient CPAP at the facility.  This patient has been seen and examined by myself. I have spent 34 minutes in his evaluation and care.  DVT prophylaxis: Lovenox Code Status: Full Code Family Communication: None available Disposition Plan: tbd    Irwin Toran, DO [facility] Direct contact: see [email or web address]  7PM-7AM contact night coverage as above 03/21/2023, 4:39 PM  LOS: 0 days   Consultants    Procedures    Antibiotics   Anti-infectives (From admission, onward)    None        Subjective  No acute events. Complains of bilateral leg  pain specifically in the foot area consistent with his neuropathy. No chest pain. No abdominal pain.. No nausea no vomiting   Objective   Vitals:  Vitals:   03/21/23 1133 03/21/23 1623  BP: 122/60 (!) 120/56  Pulse: 64 (!) 57  Resp: 18 18  Temp: (!)  97.5 F (36.4 C) 98.2 F (36.8 C)  SpO2: 95% 94%    Exam:  Constitutional:  The patient is somnolent, but rousable. No new complaints. ERespiratory:  No increased work of breathing. No wheezes, rales, or rhonchi No tactile fremitus Cardiovascular:  Regular rate and rhythm No murmurs, ectopy, or gallups. No lateral PMI. No thrills. Abdomen:  Abdomen is soft, non-tender, non-distended No hernias, masses, or organomegaly Normoactive bowel sounds.  Musculoskeletal:  No cyanosis, clubbing, or edema Skin:  No rashes, lesions, ulcers palpation of skin: no induration or nodules Neurologic:  CN 2-12 intact Sensation all 4 extremities intact Psychiatric:  Mental status Mood, affect appropriate Orientation to person, place, time  judgment and insight appear intact   I have personally reviewed the following:   Today's Data   Vitals:   03/21/23 1133 03/21/23 1623  BP: 122/60 (!) 120/56  Pulse: 64 (!) 57  Resp: 18 18  Temp: (!) 97.5 F (36.4 C) 98.2 F (36.8 C)  SpO2: 95% 94%     Lab Data  CBC    Component Value Date/Time   WBC 6.4 03/21/2023 0547   RBC 4.54 03/21/2023 0547   HGB 12.7 (L) 03/21/2023 0547   HCT 39.9 03/21/2023 0547   PLT 185 03/21/2023 0547   MCV 87.9 03/21/2023 0547   MCH 28.0 03/21/2023 0547   MCHC 31.8 03/21/2023 0547   RDW 14.6 03/21/2023 0547   LYMPHSABS 0.9 03/21/2023 0547   MONOABS 0.6 03/21/2023 0547   EOSABS 0.3 03/21/2023 0547   BASOSABS 0.1 03/21/2023 0547      Latest Ref Rng & Units 03/21/2023    5:47 AM 03/19/2023    5:33 AM 03/18/2023    1:45 PM  BMP  Glucose 70 - 99 mg/dL 161  096  045   BUN 8 - 23 mg/dL 12  21  20    Creatinine 0.61 - 1.24 mg/dL 4.09  8.11  9.14   Sodium 135 - 145 mmol/L 139  141  141   Potassium 3.5 - 5.1 mmol/L 3.3  3.6  4.0   Chloride 98 - 111 mmol/L 107  104  104   CO2 22 - 32 mmol/L 24  25  28    Calcium 8.9 - 10.3 mg/dL 9.0  8.9  9.1      Micro Data   Results for orders placed or performed  during the hospital encounter of 02/04/23  Resp panel by RT-PCR (RSV, Flu A&B, Covid) Anterior Nasal Swab     Status: None   Collection Time: 02/04/23  2:25 PM   Specimen: Anterior Nasal Swab  Result Value Ref Range Status   SARS Coronavirus 2 by RT PCR NEGATIVE NEGATIVE Final    Comment: (NOTE) SARS-CoV-2 target nucleic acids are NOT DETECTED.  The SARS-CoV-2 RNA is generally detectable in upper respiratory specimens during the acute phase of infection. The lowest concentration of SARS-CoV-2 viral copies this assay can detect is 138 copies/mL. A negative result does not preclude SARS-Cov-2 infection and should not be used as the sole basis for treatment or other patient management decisions. A negative result may occur with  improper specimen collection/handling, submission of specimen other than nasopharyngeal swab, presence of  viral mutation(s) within the areas targeted by this assay, and inadequate number of viral copies(<138 copies/mL). A negative result must be combined with clinical observations, patient history, and epidemiological information. The expected result is Negative.  Fact Sheet for Patients:  BloggerCourse.com  Fact Sheet for Healthcare Providers:  SeriousBroker.it  This test is no t yet approved or cleared by the [place] FDA and  has been authorized for detection and/or diagnosis of SARS-CoV-2 by FDA under an Emergency Use Authorization (EUA). This EUA will remain  in effect (meaning this test can be used) for the duration of the COVID-19 declaration under Section 564(b)(1) of the Act, 21 U.S.C.section 360bbb-3(b)(1), unless the authorization is terminated  or revoked sooner.       Influenza A by PCR NEGATIVE NEGATIVE Final   Influenza B by PCR NEGATIVE NEGATIVE Final    Comment: (NOTE) The Xpert Xpress SARS-CoV-2/FLU/RSV plus assay is intended as an aid in the diagnosis of influenza from  Nasopharyngeal swab specimens and should not be used as a sole basis for treatment. Nasal washings and aspirates are unacceptable for Xpert Xpress SARS-CoV-2/FLU/RSV testing.  Fact Sheet for Patients: BloggerCourse.com  Fact Sheet for Healthcare Providers: SeriousBroker.it  This test is not yet approved or cleared by the [place] FDA and has been authorized for detection and/or diagnosis of SARS-CoV-2 by FDA under an Emergency Use Authorization (EUA). This EUA will remain in effect (meaning this test can be used) for the duration of the COVID-19 declaration under Section 564(b)(1) of the Act, 21 U.S.C. section 360bbb-3(b)(1), unless the authorization is terminated or revoked.     Resp Syncytial Virus by PCR NEGATIVE NEGATIVE Final    Comment: (NOTE) Fact Sheet for Patients: BloggerCourse.com  Fact Sheet for Healthcare Providers: SeriousBroker.it  This test is not yet approved or cleared by the [place] FDA and has been authorized for detection and/or diagnosis of SARS-CoV-2 by FDA under an Emergency Use Authorization (EUA). This EUA will remain in effect (meaning this test can be used) for the duration of the COVID-19 declaration under Section 564(b)(1) of the Act, 21 U.S.C. section 360bbb-3(b)(1), unless the authorization is terminated or revoked.  Performed at [facility], [address] [place], [place] [address]     Scheduled Meds:  atorvastatin  40 mg Oral Daily   clopidogrel  75 mg Oral Daily   donepezil  10 mg Oral QHS   enoxaparin (LOVENOX) injection  60 mg Subcutaneous Q24H   feeding supplement (GLUCERNA SHAKE)  237 mL Oral TID BM   FLUoxetine  40 mg Oral Daily   fluticasone furoate-vilanterol  1 puff Inhalation Daily   insulin aspart  0-15 Units Subcutaneous TID WC   insulin aspart  0-5 Units Subcutaneous QHS   insulin glargine-yfgn  30  Units Subcutaneous QHS   levothyroxine  150 mcg Oral Daily   linaclotide  290 mcg Oral Daily   magnesium oxide  400 mg Oral BID   multivitamin with minerals  1 tablet Oral Q lunch   pantoprazole  40 mg Oral Daily   pregabalin  300 mg Oral BID   risperiDONE  0.5 mg Oral QHS   sodium chloride flush  3 mL Intravenous Q12H   Continuous Infusions:  Principal Problem:   AMS (altered mental status) Active Problems:   Elevated serum creatinine   Drug-induced encephalopathy   LOS: 0 days

## 2023-03-22 DIAGNOSIS — R410 Disorientation, unspecified: Secondary | ICD-10-CM | POA: Diagnosis not present

## 2023-03-22 LAB — CBC WITH DIFFERENTIAL/PLATELET
Abs Immature Granulocytes: 0.05 10*3/uL (ref 0.00–0.07)
Basophils Absolute: 0.1 10*3/uL (ref 0.0–0.1)
Basophils Relative: 1 %
Eosinophils Absolute: 0.3 10*3/uL (ref 0.0–0.5)
Eosinophils Relative: 5 %
HCT: 39.5 % (ref 39.0–52.0)
Hemoglobin: 12.7 g/dL — ABNORMAL LOW (ref 13.0–17.0)
Immature Granulocytes: 1 %
Lymphocytes Relative: 15 %
Lymphs Abs: 1 10*3/uL (ref 0.7–4.0)
MCH: 28.2 pg (ref 26.0–34.0)
MCHC: 32.2 g/dL (ref 30.0–36.0)
MCV: 87.8 fL (ref 80.0–100.0)
Monocytes Absolute: 0.7 10*3/uL (ref 0.1–1.0)
Monocytes Relative: 9 %
Neutro Abs: 4.8 10*3/uL (ref 1.7–7.7)
Neutrophils Relative %: 69 %
Platelets: 156 10*3/uL (ref 150–400)
RBC: 4.5 MIL/uL (ref 4.22–5.81)
RDW: 14.6 % (ref 11.5–15.5)
WBC: 6.9 10*3/uL (ref 4.0–10.5)
nRBC: 0 % (ref 0.0–0.2)

## 2023-03-22 LAB — BASIC METABOLIC PANEL
Anion gap: 6 (ref 5–15)
BUN: 17 mg/dL (ref 8–23)
CO2: 26 mmol/L (ref 22–32)
Calcium: 8.8 mg/dL — ABNORMAL LOW (ref 8.9–10.3)
Chloride: 108 mmol/L (ref 98–111)
Creatinine, Ser: 0.91 mg/dL (ref 0.61–1.24)
GFR, Estimated: >60 mL/min (ref >60)
Glucose, Bld: 169 mg/dL — ABNORMAL HIGH (ref 70–99)
Potassium: 3.5 mmol/L (ref 3.5–5.1)
Sodium: 140 mmol/L (ref 135–145)

## 2023-03-22 LAB — GLUCOSE, CAPILLARY
Glucose-Capillary: 164 mg/dL — ABNORMAL HIGH (ref 70–99)
Glucose-Capillary: 136 mg/dL — ABNORMAL HIGH (ref 70–99)
Glucose-Capillary: 186 mg/dL — ABNORMAL HIGH (ref 70–99)
Glucose-Capillary: 180 mg/dL — ABNORMAL HIGH (ref 70–99)
Glucose-Capillary: 204 mg/dL — ABNORMAL HIGH (ref 70–99)

## 2023-03-22 NOTE — Progress Notes (Signed)
PROGRESS NOTE  [name] [name]:[id number] DOB: 02/16/1946 DOA: 03/18/2023 PCP: Sherol Dade, DO  Brief History   PMH of obesity, OSA, GERD, COPD, depression, type II DM, CHF, HTN, hypothyroidism presents to the hospital with complaints of encephalopathy. Had a similar prior presentation at which time it was thought to be secondary to patient's surreptitious use of narcotics causing his admission with confusion episode. Per family concern is that the patient might be requesting his as needed medications at the SNF even if he does not needed and will be collecting it to be used at a later date and will use multiple doses at the same time.  Reportedly he did that with Benadryl earlier this year.  Potential discharge to SNF.   A & P  Acute metabolic encephalopathy. Presents with acute confusional episode. CT of the head unremarkable for any acute abnormality. VBG unremarkable. Mild evidence of AKI. Treated with IV fluid. Had a similar presentation earlier at which time he was thought to be having issues with polypharmacy. Currently no deficit.  No concerns for seizures. In March 2024 MRI brain negative for acute stroke with similar presentation. Suspecting untreated sleep apnea therefore will provide CPAP nightly. Patient's home regimen included Flexeril, Lyrica, Requip, Norco. Currently will resume 5 mg every 8 hours as needed Flexeril and home regimen of Lyrica.  Already on Requip.  Continue to hold narcotics.  Will discontinue them on discharge. This morning the patient is sleeping with BIPAP on. He responds to stimulation, but then goes back to sleep.    AKI Serum creatinine 0.91.  On admission serum creatinine is at 1.2. Treated with IV fluid.  Remaining stable after fluid.   Hypothyroidism:  Tsh WNL. Marland Kitchen Resume synthroid 150 mcg daily.    Hyperlipidemia:  Continue with Lipitor 40 mg daily.    COPD:  No wheezing heard.  Continue with dulera and albuterol prn.    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 Currently on basal bolus regimen. Hemoglobin A1c more than 10 recently but glucoses are reasonably well controlled here. 131-164 today.   Hypertension;  Chronic diastolic heart failure: Possible hypotension. Euvolemic.  Patient is on midodrine although blood pressure appears to be elevated right now therefore we will discontinue this medication and monitor.  Hypotension could be attributed to his psychotropic medications.   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    Chronic pain syndrome. Patient is on Flexeril, Lyrica, cetirizine, Aricept, Prozac, Norco. Has recurrent presentation with confusion. Concern is that the patient is accumulating his pills and taking them later on his own terms as a larger dose. UDS positive for opioids and TCA (Flexeril can give false positive for TCA) For now continuing only Aricept, Prozac, low-dose Flexeril and home regimen of Lyrica.   OSA. Patient consistent with using CPAP here in the hospital. Unsure if he is consistent with that outside of the hospital but certainly can lead to encephalopathy. At the time of admission VBG did not show severe hypercarbia. Currently tolerating CPAP well. Recommend to continue outpatient CPAP at the facility.  This patient has been seen and examined by myself. I have spent 32 minutes in his evaluation and care.  DVT prophylaxis: Lovenox Code Status: Full Code Family Communication: None available Disposition Plan: tbd    Michi Herrmann, DO [facility] Direct contact: see [email or web address]  7PM-7AM contact night coverage as above 03/22/2023, 4:24 PM,  LOS: 0 days   Consultants    Procedures  Antibiotics   Anti-infectives (From admission, onward)    None        Subjective  No acute events. Complains of bilateral leg pain specifically in the foot area consistent with his neuropathy. No chest pain. No abdominal  pain.. No nausea no vomiting   Objective   Vitals:  Vitals:   03/21/23 1133 03/21/23 1623  BP: 122/60 (!) 120/56  Pulse: 64 (!) 57  Resp: 18 18  Temp: (!) 97.5 F (36.4 C) 98.2 F (36.8 C)  SpO2: 95% 94%    Exam:  Constitutional:  The patient is somnolent, but rousable. No new complaints. ERespiratory:  No increased work of breathing. No wheezes, rales, or rhonchi No tactile fremitus Cardiovascular:  Regular rate and rhythm No murmurs, ectopy, or gallups. No lateral PMI. No thrills. Abdomen:  Abdomen is soft, non-tender, non-distended No hernias, masses, or organomegaly Normoactive bowel sounds.  Musculoskeletal:  No cyanosis, clubbing, or edema Skin:  No rashes, lesions, ulcers palpation of skin: no induration or nodules Neurologic:  CN 2-12 intact Sensation all 4 extremities intact Psychiatric:  Mental status Mood, affect appropriate Orientation to person, place, time  judgment and insight appear intact   I have personally reviewed the following:   Today's Data   Vitals:   03/22/23 0626 03/22/23 0903  BP: (!) 147/57 136/68  Pulse: (!) 52 (!) 51  Resp: 16 18  Temp: 98.6 F (37 C) 98 F (36.7 C)  SpO2: 95% 96%      Lab Data   CBC    Component Value Date/Time   WBC 6.9 03/22/2023 0516   RBC 4.50 03/22/2023 0516   HGB 12.7 (L) 03/22/2023 0516   HCT 39.5 03/22/2023 0516   PLT 156 03/22/2023 0516   MCV 87.8 03/22/2023 0516   MCH 28.2 03/22/2023 0516   MCHC 32.2 03/22/2023 0516   RDW 14.6 03/22/2023 0516   LYMPHSABS 1.0 03/22/2023 0516   MONOABS 0.7 03/22/2023 0516   EOSABS 0.3 03/22/2023 0516   BASOSABS 0.1 03/22/2023 0516       Latest Ref Rng & Units 03/22/2023    5:16 AM 03/21/2023    5:47 AM 03/19/2023    5:33 AM  BMP  Glucose 70 - 99 mg/dL 161  096  045   BUN 8 - 23 mg/dL 17  12  21    Creatinine 0.61 - 1.24 mg/dL 4.09  8.11  9.14   Sodium 135 - 145 mmol/L 140  139  141   Potassium 3.5 - 5.1 mmol/L 3.5  3.3  3.6   Chloride 98 -  111 mmol/L 108  107  104   CO2 22 - 32 mmol/L 26  24  25    Calcium 8.9 - 10.3 mg/dL 8.8  9.0  8.9     Micro Data   Results for orders placed or performed during the hospital encounter of 02/04/23  Resp panel by RT-PCR (RSV, Flu A&B, Covid) Anterior Nasal Swab     Status: None   Collection Time: 02/04/23  2:25 PM   Specimen: Anterior Nasal Swab  Result Value Ref Range Status   SARS Coronavirus 2 by RT PCR NEGATIVE NEGATIVE Final    Comment: (NOTE) SARS-CoV-2 target nucleic acids are NOT DETECTED.  The SARS-CoV-2 RNA is generally detectable in upper respiratory specimens during the acute phase of infection. The lowest concentration of SARS-CoV-2 viral copies this assay can detect is 138 copies/mL. A negative result does not preclude SARS-Cov-2 infection and should not be used as  the sole basis for treatment or other patient management decisions. A negative result may occur with  improper specimen collection/handling, submission of specimen other than nasopharyngeal swab, presence of viral mutation(s) within the areas targeted by this assay, and inadequate number of viral copies(<138 copies/mL). A negative result must be combined with clinical observations, patient history, and epidemiological information. The expected result is Negative.  Fact Sheet for Patients:  BloggerCourse.com  Fact Sheet for Healthcare Providers:  SeriousBroker.it  This test is no t yet approved or cleared by the [place] FDA and  has been authorized for detection and/or diagnosis of SARS-CoV-2 by FDA under an Emergency Use Authorization (EUA). This EUA will remain  in effect (meaning this test can be used) for the duration of the COVID-19 declaration under Section 564(b)(1) of the Act, 21 U.S.C.section 360bbb-3(b)(1), unless the authorization is terminated  or revoked sooner.       Influenza A by PCR NEGATIVE NEGATIVE Final   Influenza B by PCR  NEGATIVE NEGATIVE Final    Comment: (NOTE) The Xpert Xpress SARS-CoV-2/FLU/RSV plus assay is intended as an aid in the diagnosis of influenza from Nasopharyngeal swab specimens and should not be used as a sole basis for treatment. Nasal washings and aspirates are unacceptable for Xpert Xpress SARS-CoV-2/FLU/RSV testing.  Fact Sheet for Patients: BloggerCourse.com  Fact Sheet for Healthcare Providers: SeriousBroker.it  This test is not yet approved or cleared by the [place] FDA and has been authorized for detection and/or diagnosis of SARS-CoV-2 by FDA under an Emergency Use Authorization (EUA). This EUA will remain in effect (meaning this test can be used) for the duration of the COVID-19 declaration under Section 564(b)(1) of the Act, 21 U.S.C. section 360bbb-3(b)(1), unless the authorization is terminated or revoked.     Resp Syncytial Virus by PCR NEGATIVE NEGATIVE Final    Comment: (NOTE) Fact Sheet for Patients: BloggerCourse.com  Fact Sheet for Healthcare Providers: SeriousBroker.it  This test is not yet approved or cleared by the [place] FDA and has been authorized for detection and/or diagnosis of SARS-CoV-2 by FDA under an Emergency Use Authorization (EUA). This EUA will remain in effect (meaning this test can be used) for the duration of the COVID-19 declaration under Section 564(b)(1) of the Act, 21 U.S.C. section 360bbb-3(b)(1), unless the authorization is terminated or revoked.  Performed at [facility], [address] [place], [place] [address]     Scheduled Meds:  atorvastatin  40 mg Oral Daily   clopidogrel  75 mg Oral Daily   donepezil  10 mg Oral QHS   enoxaparin (LOVENOX) injection  60 mg Subcutaneous Q24H   feeding supplement (GLUCERNA SHAKE)  237 mL Oral TID BM   FLUoxetine  40 mg Oral Daily   fluticasone furoate-vilanterol   1 puff Inhalation Daily   insulin aspart  0-15 Units Subcutaneous TID WC   insulin aspart  0-5 Units Subcutaneous QHS   insulin glargine-yfgn  30 Units Subcutaneous QHS   levothyroxine  150 mcg Oral Daily   linaclotide  290 mcg Oral Daily   magnesium oxide  400 mg Oral BID   multivitamin with minerals  1 tablet Oral Q lunch   pantoprazole  40 mg Oral Daily   pregabalin  300 mg Oral BID   risperiDONE  0.5 mg Oral QHS   sodium chloride flush  3 mL Intravenous Q12H   Continuous Infusions:  Principal Problem:   AMS (altered mental status) Active Problems:   Elevated serum creatinine  Drug-induced encephalopathy   LOS: 0 days

## 2023-03-23 DIAGNOSIS — R410 Disorientation, unspecified: Secondary | ICD-10-CM | POA: Diagnosis not present

## 2023-03-23 LAB — GLUCOSE, CAPILLARY
Glucose-Capillary: 127 mg/dL — ABNORMAL HIGH (ref 70–99)
Glucose-Capillary: 223 mg/dL — ABNORMAL HIGH (ref 70–99)
Glucose-Capillary: 185 mg/dL — ABNORMAL HIGH (ref 70–99)
Glucose-Capillary: 149 mg/dL — ABNORMAL HIGH (ref 70–99)
Glucose-Capillary: 124 mg/dL — ABNORMAL HIGH (ref 70–99)

## 2023-03-23 NOTE — NC FL2 (Signed)
[place] MEDICAID FL2 LEVEL OF CARE FORM     IDENTIFICATION  Patient Name: [name] [name] Birthdate: November 14, 1946 Sex: male Admission Date (Current Location): 03/18/2023  [facility] and [place] Number:  Chiropodist and Address:  [facility], [address] [place], [place] [address]      Provider Number: [id number]  Attending Physician Name and Address:  Gillis Santa, MD  Relative Name and Phone Number:  Pierce Crane (Other) [phone]    Current Level of Care: Hospital Recommended Level of Care: Skilled Nursing Facility Prior Approval Number:    Date Approved/Denied:   PASRR Number: [id number]  Discharge Plan: SNF    Current Diagnoses: Patient Active Problem List   Diagnosis Date Noted   Drug-induced encephalopathy 03/21/2023   AMS (altered mental status) 03/18/2023   Elevated serum creatinine 03/18/2023   Acute encephalopathy 02/05/2023   Lethargy 02/04/2023   Cyanosis 02/04/2023   Speech abnormality 02/04/2023   Bradycardia 02/04/2023   Esophageal thickening 02/04/2023   Liver nodule 02/04/2023   Cerebral infarct (HCC) 02/04/2023   Dehydration 02/04/2023   TIA (transient ischemic attack) 07/05/2022   Abnormal MRI of head 07/05/2022   Uncontrolled diabetes mellitus with hyperglycemia, without long-term current use of insulin (HCC)    Morbid obesity (HCC)    Cervical myelopathy with history of cervical discectomy and fusion (HCC)    Esophageal dysphagia 11/18/2020   Elevated BUN 11/18/2020   AKI (acute kidney injury) (HCC) 11/18/2020   HCAP (healthcare-associated pneumonia)    Mucus plugging of bronchi    Left cervical radiculopathy 10/26/2020   Acute cervical radiculopathy 10/26/2020   Pressure injury of skin 10/26/2020   Problems with swallowing and mastication    Stricture and stenosis of esophagus    Hx of colonic polyps    Polyp of ascending colon    Bilateral carpal tunnel syndrome 02/13/2020   Neurocardiogenic  syncope 03/02/2019   Lymphedema 02/04/2019   Tobacco use 02/03/2019   CHF (congestive heart failure) (HCC) 12/23/2018   Chronic diastolic CHF (congestive heart failure) (HCC) 11/27/2018   Cellulitis 11/27/2018   Hypothyroidism 11/27/2018   OSA (obstructive sleep apnea) 11/27/2018   Hypertension 11/27/2018   Mixed hyperlipidemia 11/27/2018   Protein-calorie malnutrition (HCC) 03/02/2018   Neuropathy 03/02/2018   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   Difficulty in voiding 03/01/2018   Vitamin D deficiency 03/01/2018   Vitamin B12 deficiency 03/01/2018   Insomnia 03/01/2018   COPD (chronic obstructive pulmonary disease) (HCC) 02/11/2018   Pain due to onychomycosis of nail 02/11/2018   Onychomycosis 02/11/2018   Traumatic ecchymosis of foot, right, initial encounter 02/11/2018   Xerostomia 02/11/2018   Frequent falls 02/11/2018   Acute respiratory failure (HCC) 02/07/2018   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   History of thyroid cancer 05/05/2014    Orientation RESPIRATION BLADDER Height & Weight     Self, Time, Place, Situation  Normal Incontinent, External catheter Weight: 288 lb 1.6 oz (130.7 kg) Height:  6\' 2"  (188 cm)  BEHAVIORAL SYMPTOMS/MOOD NEUROLOGICAL BOWEL NUTRITION STATUS      Incontinent Diet (carb modified)  AMBULATORY STATUS COMMUNICATION OF NEEDS Skin   Extensive Assist    (Right leg abrasion, Redness to leg, scrotum, and buttocks)                       Personal Care Assistance Level of Assistance  Bathing, Feeding, Dressing Bathing Assistance: Maximum assistance Feeding assistance: Limited assistance Dressing Assistance: Maximum assistance  Functional Limitations Info  Sight, Hearing, Speech Sight Info: Impaired Hearing Info: Adequate Speech Info: Adequate    SPECIAL CARE FACTORS FREQUENCY  PT (By licensed PT), OT (By licensed OT)     PT Frequency: 5 times a week OT Frequency: 5 times a week             Contractures Contractures Info: Not present    Additional Factors Info  Code Status Code Status Info: FULL             Current Medications (03/23/2023):  This is the current hospital active medication list Current Facility-Administered Medications  Medication Dose Route Frequency Provider Last Rate Last Admin   acetaminophen (TYLENOL) tablet 650 mg  650 mg Oral Q6H PRN Nolberto Hanlon, MD       Or   acetaminophen (TYLENOL) suppository 650 mg  650 mg Rectal Q6H PRN Nolberto Hanlon, MD       albuterol (PROVENTIL) (2.5 MG/3ML) 0.083% nebulizer solution 2.5 mg  2.5 mg Nebulization Q6H PRN Otelia Sergeant, RPH       atorvastatin (LIPITOR) tablet 40 mg  40 mg Oral Daily Nolberto Hanlon, MD   40 mg at 03/23/23 1610   camphor-menthol (SARNA) lotion   Topical PRN Rolly Salter, MD       clopidogrel (PLAVIX) tablet 75 mg  75 mg Oral Daily Nolberto Hanlon, MD   75 mg at 03/23/23 0943   cyclobenzaprine (FLEXERIL) tablet 5 mg  5 mg Oral Q8H PRN Rolly Salter, MD   5 mg at 03/20/23 1726   donepezil (ARICEPT) tablet 10 mg  10 mg Oral Sherene Sires, MD   10 mg at 03/22/23 2100   enoxaparin (LOVENOX) injection 60 mg  60 mg Subcutaneous Q24H Jaynie Bream, RPH   60 mg at 03/23/23 9604   feeding supplement (GLUCERNA SHAKE) (GLUCERNA SHAKE) liquid 237 mL  237 mL Oral TID BM Swayze, Ava, DO   237 mL at 03/23/23 0945   FLUoxetine (PROZAC) capsule 40 mg  40 mg Oral Daily Rolly Salter, MD   40 mg at 03/23/23 0943   fluticasone furoate-vilanterol (BREO ELLIPTA) 200-25 MCG/ACT 1 puff  1 puff Inhalation Daily Nolberto Hanlon, MD   1 puff at 03/23/23 0941   hydrALAZINE (APRESOLINE) injection 10 mg  10 mg Intravenous Q4H PRN Rolly Salter, MD       insulin aspart (novoLOG) injection 0-15 Units  0-15 Units Subcutaneous TID WC Nolberto Hanlon, MD   5 Units at 03/23/23 1239   insulin aspart (novoLOG) injection 0-5 Units  0-5 Units Subcutaneous QHS Nolberto Hanlon, MD       insulin glargine-yfgn [facility]) injection 30 Units   30 Units Subcutaneous QHS Nolberto Hanlon, MD   30 Units at 03/22/23 2100   levothyroxine (SYNTHROID) tablet 150 mcg  150 mcg Oral Daily Nolberto Hanlon, MD   150 mcg at 03/23/23 0517   linaclotide (LINZESS) capsule 290 mcg  290 mcg Oral Daily Rolly Salter, MD   290 mcg at 03/23/23 1054   magnesium oxide (MAG-OX) tablet 400 mg  400 mg Oral BID Nolberto Hanlon, MD   400 mg at 03/23/23 0943   multivitamin with minerals tablet 1 tablet  1 tablet Oral Q lunch Swayze, Ava, DO   1 tablet at 03/23/23 0943   ondansetron (ZOFRAN) injection 4 mg  4 mg Intravenous Q6H PRN Rolly Salter, MD   4 mg at 03/20/23 1829   pantoprazole (PROTONIX) EC tablet 40  mg  40 mg Oral Daily Nolberto Hanlon, MD   40 mg at 03/23/23 0943   pregabalin (LYRICA) capsule 300 mg  300 mg Oral BID Rolly Salter, MD   300 mg at 03/23/23 0944   risperiDONE (RISPERDAL) tablet 0.5 mg  0.5 mg Oral QHS Rolly Salter, MD   0.5 mg at 03/22/23 2101   sodium chloride flush (NS) 0.9 % injection 3 mL  3 mL Intravenous Q12H Nolberto Hanlon, MD   3 mL at 03/23/23 0944     Discharge Medications: Please see discharge summary for a list of discharge medications.  Relevant Imaging Results:  Relevant Lab Results:   Additional Information SS [id number]  Allena Katz, LCSW

## 2023-03-23 NOTE — Progress Notes (Signed)
Physical Therapy Treatment Patient Details Name: [name] [name] MRN: [id number] DOB: 1946-11-07 Today's Date: 03/23/2023   History of Present Illness presented to ER and admitted for work up related ot AMS, decreased responsiveness and lethargy.    PT Comments    Pt seen for PT tx with pt agreeable. Pt is able to complete STS with min assist with cuing for increased anterior weight shifting but pt limited 2/2 body habitus but attempts as able. Pt is able to take a few steps forwards & backwards with RW & min assist with PT providing chair follow for safety. Pt also engages in BLE strengthening exercises with rest breaks PRN. Continue to recommend ongoing PT services to address strengthening, balance, & endurance to increase independence with bed mobility, transfers & gait.     Recommendations for follow up therapy are one component of a multi-disciplinary discharge planning process, led by the attending physician.  Recommendations may be updated based on patient status, additional functional criteria and insurance authorization.  Follow Up Recommendations  Can patient physically be transported by private vehicle: No    Assistance Recommended at Discharge Intermittent Supervision/Assistance  Patient can return home with the following A lot of help with walking and/or transfers;A lot of help with bathing/dressing/bathroom   Equipment Recommendations  Rolling walker (2 wheels)    Recommendations for Other Services       Precautions / Restrictions Precautions Precautions: Fall Restrictions Weight Bearing Restrictions: No     Mobility  Bed Mobility               General bed mobility comments: not tested, pt received & left sitting in recliner    Transfers Overall transfer level: Needs assistance Equipment used: Rolling walker (2 wheels) Transfers: Sit to/from Stand Sit to Stand: Min assist           General transfer comment: Pt transfers STS from recliner x 2 times  during session, extra time to scoot out to edge of seat prior to initiating STS, requires max cuing for increased anterior weight shifting & assistance. Pt limited in ability to shift weight anteriorly 2/2 body habitus.    Ambulation/Gait Ambulation/Gait assistance: Min assist Gait Distance (Feet):  (2 ft forwards & backwards + 4 ft forwards & 2 ft backwards with RW) Assistive device: Rolling walker (2 wheels) Gait Pattern/deviations: Decreased step length - left, Decreased step length - right, Decreased dorsiflexion - right, Decreased dorsiflexion - left, Decreased stride length Gait velocity: decreased Gait velocity interpretation: <1.31 ft/sec, indicative of household ambulator   General Gait Details: PT provides chair follow for increased safety, cuing for RW management when stepping backwards.   Stairs             Wheelchair Mobility    Modified Rankin (Stroke Patients Only)       Balance Overall balance assessment: Needs assistance         Standing balance support: During functional activity, Bilateral upper extremity supported, Reliant on assistive device for balance Standing balance-Leahy Scale: Poor                              Cognition Arousal/Alertness: Awake/alert Behavior During Therapy: WFL for tasks assessed/performed Overall Cognitive Status: Within Functional Limits for tasks assessed                                 General Comments:  Follows simple commands throughout session, unsure why he came to the hospital & why he's still here.        Exercises General Exercises - Lower Extremity Long Arc Quad: AROM, Seated, Strengthening, Both, 20 reps (2 sets x 10 reps) Hip ABduction/ADduction: AROM, Strengthening, Seated, Both, 20 reps (2 sets x 10 reps, hip adduction pillow squeezes)    General Comments        Pertinent Vitals/Pain Pain Assessment Pain Assessment: No/denies pain    Home Living                           Prior Function            PT Goals (current goals can now be found in the care plan section) Acute Rehab PT Goals Patient Stated Goal: to get cleaned up and dry PT Goal Formulation: With patient Time For Goal Achievement: 04/02/23 Potential to Achieve Goals: Fair Progress towards PT goals: Progressing toward goals    Frequency    Min 2X/week      PT Plan Current plan remains appropriate    Co-evaluation              AM-PAC PT "6 Clicks" Mobility   Outcome Measure  Help needed turning from your back to your side while in a flat bed without using bedrails?: A Lot Help needed moving from lying on your back to sitting on the side of a flat bed without using bedrails?: A Lot Help needed moving to and from a bed to a chair (including a wheelchair)?: A Little Help needed standing up from a chair using your arms (e.g., wheelchair or bedside chair)?: A Little Help needed to walk in hospital room?: A Lot Help needed climbing 3-5 steps with a railing? : Total 6 Click Score: 13    End of Session Equipment Utilized During Treatment: Gait belt Activity Tolerance: Patient tolerated treatment well Patient left: in chair;with chair alarm set;with call bell/phone within reach Nurse Communication: Mobility status PT Visit Diagnosis: Muscle weakness (generalized) (M62.81);Other abnormalities of gait and mobility (R26.89);Difficulty in walking, not elsewhere classified (R26.2);Unsteadiness on feet (R26.81)     Time: 8295-6213 PT Time Calculation (min) (ACUTE ONLY): 25 min  Charges:  $Therapeutic Activity: 23-37 mins                     Aleda Grana, PT, DPT 03/23/23, 10:49 AM   Sandi Mariscal 03/23/2023, 10:46 AM

## 2023-03-23 NOTE — Progress Notes (Signed)
[facility] Progress Note  Patient: [name] [name]    UYQ:[id number]  DOA: 03/18/2023     Date of Service: the patient was seen and examined on 03/23/2023  Chief Complaint  Patient presents with   Altered Mental Status   Brief hospital course: PMH of obesity, OSA, GERD, COPD, depression, type II DM, CHF, HTN, hypothyroidism presents to the hospital with complaints of encephalopathy. Had a similar prior presentation at which time it was thought to be secondary to patient's surreptitious use of narcotics causing his admission with confusion episode. Per family concern is that the patient might be requesting his as needed medications at the SNF even if he does not needed and will be collecting it to be used at a later date and will use multiple doses at the same time.  Reportedly he did that with Benadryl earlier this year.   Potential discharge to SNF.   Assessment and Plan:  # Acute metabolic encephalopathy. Presents with acute confusional episode. CT of the head unremarkable for any acute abnormality. VBG unremarkable. Mild evidence of AKI. Treated with IV fluid. Had a similar presentation earlier at which time he was thought to be having issues with polypharmacy. Currently no deficit.  No concerns for seizures. In March 2024 MRI brain negative for acute stroke with similar presentation. Suspecting untreated sleep apnea therefore will provide CPAP nightly. Patient's home regimen included Flexeril, Lyrica, Requip, Norco. Currently will resume 5 mg every 8 hours as needed Flexeril and home regimen of Lyrica.  Already on Requip.  Continue to hold narcotics.  Will discontinue them on discharge. This morning the patient is sleeping with BIPAP on. He responds to stimulation, but then goes back to sleep.    # AKI Serum creatinine 0.91.  On admission serum creatinine is at 1.2. Treated with IV fluid.  Remaining stable after fluid.   # Hypothyroidism:  Tsh WNL. Marland Kitchen Resume synthroid 150  mcg daily.    # Hyperlipidemia:  Continue with Lipitor 40 mg daily.    # COPD:  No wheezing heard.  Continue with dulera and albuterol prn.    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 Currently on basal bolus regimen. Hemoglobin A1c more than 10 recently but glucoses are reasonably well controlled here. 131-164 today.   # Hypertension;  # Chronic diastolic heart failure: # Possible hypotension. Euvolemic.  Patient is on midodrine although blood pressure appears to be elevated right now therefore we will discontinue this medication and monitor.  Hypotension could be attributed to his psychotropic medications.   # Early Dementia:  On Aricept.    # Chronic pain syndrome. Patient is on Flexeril, Lyrica, cetirizine, Aricept, Prozac, Norco. Has recurrent presentation with confusion. Concern is that the patient is accumulating his pills and taking them later on his own terms as a larger dose. UDS positive for opioids and TCA (Flexeril can give false positive for TCA) For now continuing only Aricept, Prozac, low-dose Flexeril and home regimen of Lyrica.   # OSA. Patient consistent with using CPAP here in the hospital. Unsure if he is consistent with that outside of the hospital but certainly can lead to encephalopathy. At the time of admission VBG did not show severe hypercarbia. Currently tolerating CPAP well. Recommend to continue outpatient CPAP at the facility.   Body mass index is 36.99 kg/m.  Nutrition Problem: Inadequate oral intake Etiology: poor appetite Interventions: Interventions: Glucerna shake, MVI  Pressure Injury 10/26/20 Perineum Right Stage  2 -  Partial thickness loss of dermis presenting as a shallow open injury with a red, pink wound bed without slough. dry (Active)  10/26/20 1709  Location: Perineum  Location Orientation: Right  Staging: Stage 2 -  Partial thickness loss of dermis presenting as  a shallow open injury with a red, pink wound bed without slough.  Wound Description (Comments): dry  Present on Admission: Yes     Pressure Injury 11/05/20 Nose Mid;Upper Unstageable - Full thickness tissue loss in which the base of the injury is covered by slough (yellow, tan, gray, green or brown) and/or eschar (tan, brown or black) in the wound bed. (Active)  11/05/20 0800  Location: Nose  Location Orientation: Mid;Upper  Staging: Unstageable - Full thickness tissue loss in which the base of the injury is covered by slough (yellow, tan, gray, green or brown) and/or eschar (tan, brown or black) in the wound bed.  Wound Description (Comments):   Present on Admission: No     Pressure Injury 11/17/20 Back Left Stage 2 -  Partial thickness loss of dermis presenting as a shallow open injury with a red, pink wound bed without slough. skin tear (Active)  11/17/20 1019  Location: Back  Location Orientation: Left  Staging: Stage 2 -  Partial thickness loss of dermis presenting as a shallow open injury with a red, pink wound bed without slough.  Wound Description (Comments): skin tear  Present on Admission: No     Pressure Injury 02/05/23 Buttocks Left Stage 2 -  Partial thickness loss of dermis presenting as a shallow open injury with a red, pink wound bed without slough. (Active)  02/05/23 1504  Location: Buttocks  Location Orientation: Left  Staging: Stage 2 -  Partial thickness loss of dermis presenting as a shallow open injury with a red, pink wound bed without slough.  Wound Description (Comments):   Present on Admission:      Diet: Carb modified diet DVT Prophylaxis: Subcutaneous Lovenox   Advance goals of care discussion: Full code  Family Communication: family was not present at bedside, at the time of interview.  The pt provided permission to discuss medical plan with the family. Opportunity was given to ask question and all questions were answered satisfactorily.    Disposition:  Pt is from SNF, admitted with AMS, responsiveness possible due to overdose of medications, clinically stable, discharged to SNF on 03/24/2023 as per TOC, patient needs  3 night stay to go for SNF    Subjective: No significant events overnight, patient was laying comfortably in the bed, did not offer any complaints.  Does have chronic pain in the bilateral lower extremities and feet  Physical Exam: General: NAD, lying comfortably Appear in no distress, affect appropriate Eyes: PERRLA ENT: Oral Mucosa Clear, moist  Neck: no JVD,  Cardiovascular: S1 and S2 Present, no Murmur,  Respiratory: good respiratory effort, Bilateral Air entry equal and Decreased, no Crackles, no wheezes Abdomen: Bowel Sound present, Soft and no tenderness,  Skin: no rashes Extremities: 1-2+ pedal edema, no calf tenderness Neurologic: without any new focal findings Gait not checked due to patient safety concerns  Vitals:   03/22/23 1700 03/22/23 1932 03/23/23 0823 03/23/23 1100  BP: 130/63 (!) 104/44 133/63 134/82  Pulse: 65 (!) 57 (!) 53 72  Resp: 18 18 18 16   Temp: 98.7 F (37.1 C) 97.7 F (36.5 C) 97.6 F (36.4 C) 98 F (36.7 C)  TempSrc: Oral Axillary Oral Oral  SpO2: 98% 97% 93% 96%  Weight:      Height:        Intake/Output Summary (Last 24 hours) at 03/23/2023 1616 Last data filed at 03/22/2023 1749 Gross per 24 hour  Intake --  Output 200 ml  Net -200 ml   Filed Weights   03/18/23 1343 03/18/23 2300  Weight: (!) 138.9 kg 130.7 kg    Data Reviewed: I have personally reviewed and interpreted daily labs, tele strips, imagings as discussed above. I reviewed all nursing notes, pharmacy notes, vitals, pertinent old records I have discussed plan of care as described above with RN and patient/family.  CBC: Recent Labs  Lab 03/18/23 1422 03/19/23 0533 03/21/23 0547 03/22/23 0516  WBC 9.1 9.8 6.4 6.9  NEUTROABS 6.9  --  4.5 4.8  HGB 13.9 13.2 12.7* 12.7*  HCT 43.8 41.9 39.9  39.5  MCV 87.8 88.0 87.9 87.8  PLT 189 173 185 156   Basic Metabolic Panel: Recent Labs  Lab 03/18/23 1345 03/19/23 0533 03/21/23 0547 03/22/23 0516  NA 141 141 139 140  K 4.0 3.6 3.3* 3.5  CL 104 104 107 108  CO2 28 25 24 26   GLUCOSE 124* 145* 128* 169*  BUN 20 21 12 17   CREATININE 1.25* 1.00 0.81 0.91  CALCIUM 9.1 8.9 9.0 8.8*  MG  --  1.9 2.1  --     Studies: No results found.  Scheduled Meds:  atorvastatin  40 mg Oral Daily   clopidogrel  75 mg Oral Daily   donepezil  10 mg Oral QHS   enoxaparin (LOVENOX) injection  60 mg Subcutaneous Q24H   feeding supplement (GLUCERNA SHAKE)  237 mL Oral TID BM   FLUoxetine  40 mg Oral Daily   fluticasone furoate-vilanterol  1 puff Inhalation Daily   insulin aspart  0-15 Units Subcutaneous TID WC   insulin aspart  0-5 Units Subcutaneous QHS   insulin glargine-yfgn  30 Units Subcutaneous QHS   levothyroxine  150 mcg Oral Daily   linaclotide  290 mcg Oral Daily   magnesium oxide  400 mg Oral BID   multivitamin with minerals  1 tablet Oral Q lunch   pantoprazole  40 mg Oral Daily   pregabalin  300 mg Oral BID   risperiDONE  0.5 mg Oral QHS   sodium chloride flush  3 mL Intravenous Q12H   Continuous Infusions: PRN Meds: acetaminophen **OR** acetaminophen, albuterol, camphor-menthol, cyclobenzaprine, hydrALAZINE, ondansetron (ZOFRAN) IV  Time spent: 35 minutes  Author: Gillis Santa. MD [facility] 03/23/2023 4:16 PM  To reach On-call, see care teams to locate the attending and reach out to them via [email or web address]. If 7PM-7AM, please contact night-coverage If you still have difficulty reaching the attending provider, please page the [facility] (Director on Call) for [facility] on amion for assistance.

## 2023-03-23 NOTE — Progress Notes (Signed)
Nutrition Follow-up  DOCUMENTATION CODES:   Obesity unspecified  INTERVENTION:   -Continue MVI with minerals daily -Continue Glucerna Shake po TID, each supplement provides 220 kcal and 10 grams of protein  -Magic cup TID with meals, each supplement provides 290 kcal and 9 grams of protein   NUTRITION DIAGNOSIS:   Inadequate oral intake related to poor appetite as evidenced by per patient/family report.  Ongoing  GOAL:   Patient will meet greater than or equal to 90% of their needs  Progressing   MONITOR:   PO intake, Supplement acceptance  REASON FOR ASSESSMENT:   Consult Assessment of nutrition requirement/status, Poor PO  ASSESSMENT:   Pt with PMH of obesity, OSA, GERD, COPD, depression, type II DM, CHF, HTN, hypothyroidism presents to the hospital with complaints of encephalopathy.  Reviewed I/O's: -500 ml x 24 hours and -240 ml since admission  UOP: 500 ml x 24 hours   Pt resting in recliner chair at time of visit. He did not respond to voice or touch.   He remains on a carb modified diet. Noted meal completions 25-50%. Pt is accepting Glucerna supplements per MAR. Noted Glucerna was just delivered this morning, but untouched on tray table.   Per MD notes, pt may require SNF placement.   Medications reviewed and include magnesium oxide.  Labs reviewed: CBGS: 127-204 (inpatient orders for glycemic control are 0-15 units insulin aspart TID with meals, 0-5 units insulin aspart daily at bedtime, and 30 units insulin glargine-yfgn daily).    Diet Order:   Diet Order             Diet Carb Modified Fluid consistency: Thin; Room service appropriate? Yes  Diet effective now                   EDUCATION NEEDS:   No education needs have been identified at this time  Skin:  Skin Assessment: Reviewed RN Assessment  Last BM:  5/36/24 (type 4)  Height:   Ht Readings from Last 1 Encounters:  03/18/23 6\' 2"  (1.88 m)    Weight:   Wt Readings from Last  1 Encounters:  03/18/23 130.7 kg    Ideal Body Weight:  86.4 kg  BMI:  Body mass index is 36.99 kg/m.  Estimated Nutritional Needs:   Kcal:  2150-2350  Protein:  105-120 grams  Fluid:  > 2 L    Levada Schilling, RD, LDN, CDCES Registered Dietitian II Certified Diabetes Care and Education Specialist Please refer to [facility] for RD and/or RD on-call/weekend/after hours pager

## 2023-03-24 DIAGNOSIS — R410 Disorientation, unspecified: Secondary | ICD-10-CM | POA: Diagnosis not present

## 2023-03-24 LAB — GLUCOSE, CAPILLARY
Glucose-Capillary: 120 mg/dL — ABNORMAL HIGH (ref 70–99)
Glucose-Capillary: 199 mg/dL — ABNORMAL HIGH (ref 70–99)
Glucose-Capillary: 167 mg/dL — ABNORMAL HIGH (ref 70–99)

## 2023-03-24 MED ORDER — POTASSIUM CHLORIDE CRYS ER 20 MEQ PO TBCR: 20.0000 meq | EXTENDED_RELEASE_TABLET | Freq: Every day | ORAL | Status: DC

## 2023-03-24 MED ORDER — FUROSEMIDE 40 MG PO TABS: 40.0000 mg | ORAL_TABLET | Freq: Every day | ORAL | Status: DC

## 2023-03-24 NOTE — Progress Notes (Signed)
Physical Therapy Treatment Patient Details Name: [name] [name] MRN: [id number] DOB: 08-31-1946 Today's Date: 03/24/2023   History of Present Illness presented to ER and admitted for work up related ot AMS, decreased responsiveness and lethargy.    PT Comments    Pt in chair.  Participated in exercises as described below.  Stood x 2 at chairside with min guard for brief periods of time but did not feel comfortable stepping at this time.  Stated he has had very limited gait for several years but did take a few steps to [facility] with nursing today.   Recommendations for follow up therapy are one component of a multi-disciplinary discharge planning process, led by the attending physician.  Recommendations may be updated based on patient status, additional functional criteria and insurance authorization.  Follow Up Recommendations       Assistance Recommended at Discharge Intermittent Supervision/Assistance  Patient can return home with the following A lot of help with walking and/or transfers;A lot of help with bathing/dressing/bathroom   Equipment Recommendations  Rolling walker (2 wheels)    Recommendations for Other Services       Precautions / Restrictions Precautions Precautions: Fall Restrictions Weight Bearing Restrictions: No     Mobility  Bed Mobility               General bed mobility comments: not tested, pt received & left sitting in recliner Patient Response: Cooperative  Transfers Overall transfer level: Needs assistance Equipment used: Rolling walker (2 wheels) Transfers: Sit to/from Stand Sit to Stand: Min guard           General transfer comment: static standing x 2 at chairside.  stated he did step a bit with nursing this am to [facility] for bathing    Ambulation/Gait               General Gait Details: did not feel strong enought for gait but agreed to standing x 2.   Stairs             Wheelchair Mobility    Modified Rankin (Stroke  Patients Only)       Balance Overall balance assessment: Needs assistance Sitting-balance support: Single extremity supported, Feet supported Sitting balance-Leahy Scale: Good     Standing balance support: During functional activity, Bilateral upper extremity supported, Reliant on assistive device for balance Standing balance-Leahy Scale: Poor Standing balance comment: poor endurnace but generally steady for short static time periods                            Cognition Arousal/Alertness: Awake/alert Behavior During Therapy: WFL for tasks assessed/performed Overall Cognitive Status: Within Functional Limits for tasks assessed                                          Exercises Other Exercises Other Exercises: supine and seated AROM x 10    General Comments        Pertinent Vitals/Pain Pain Assessment Pain Assessment: No/denies pain    Home Living                          Prior Function            PT Goals (current goals can now be found in the care plan section) Progress towards PT goals: Progressing toward  goals    Frequency    Min 2X/week      PT Plan Current plan remains appropriate    Co-evaluation              AM-PAC PT "6 Clicks" Mobility   Outcome Measure  Help needed turning from your back to your side while in a flat bed without using bedrails?: A Lot Help needed moving from lying on your back to sitting on the side of a flat bed without using bedrails?: A Lot Help needed moving to and from a bed to a chair (including a wheelchair)?: A Little Help needed standing up from a chair using your arms (e.g., wheelchair or bedside chair)?: A Little Help needed to walk in hospital room?: A Lot Help needed climbing 3-5 steps with a railing? : Total 6 Click Score: 13    End of Session Equipment Utilized During Treatment: Gait belt Activity Tolerance: Patient tolerated treatment well Patient left: in  chair;with chair alarm set;with call bell/phone within reach Nurse Communication: Mobility status PT Visit Diagnosis: Muscle weakness (generalized) (M62.81);Other abnormalities of gait and mobility (R26.89);Difficulty in walking, not elsewhere classified (R26.2);Unsteadiness on feet (R26.81)     Time: 1308-6578 PT Time Calculation (min) (ACUTE ONLY): 23 min  Charges:  $Therapeutic Exercise: 8-22 mins $Therapeutic Activity: 8-22 mins                     Danielle Dess, PTA 03/24/23, 1:37 PM

## 2023-03-24 NOTE — TOC Transition Note (Signed)
Transition of Care [facility]) - CM/SW Discharge Note   Patient Details  Name: [name] [name] MRN: [id number] Date of Birth: 05-05-1946  Transition of Care [facility] CM/SW Contact:  Allena Katz, LCSW Phone Number: 03/24/2023, 1:11 PM   Clinical Narrative: Pt has orders to discharge back to [facility]. DC summary to be sent to facility once in. Medical necessity printed to unit. RN given number for report. Sister notified. CSW signing off.      Final next level of care: Skilled Nursing Facility Barriers to Discharge: Barriers Resolved   Patient Goals and CMS Choice CMS Medicare.gov Compare Post Acute Care list provided to:: Patient Choice offered to / list presented to : [facility] POA / Guardian  Discharge Placement                    Name of family member notified: Sister nancy Patient and family notified of of transfer: 03/24/23  Discharge Plan and Services Additional resources added to the After Visit Summary for                                       Social Determinants of Health (SDOH) Interventions SDOH Screenings   Food Insecurity: No Food Insecurity (03/19/2023)  Housing: Low Risk  (03/19/2023)  Transportation Needs: No Transportation Needs (03/19/2023)  Utilities: Not At Risk (03/19/2023)  Tobacco Use: Medium Risk (03/19/2023)     Readmission Risk Interventions     No data to display

## 2023-03-24 NOTE — Progress Notes (Signed)
Called and gave report to Jonathon Jordan RN at [facility] [phone] .

## 2023-03-24 NOTE — Discharge Summary (Signed)
[facility] Discharge Summary   Patient: [name] [name] ZOX:[id number]  PCP: Sherol Dade, DO  Date of admission: 03/18/2023   Date of discharge:  03/24/2023     Discharge Diagnoses:  Principal Problem:   AMS (altered mental status) Active Problems:   Elevated serum creatinine   Drug-induced encephalopathy   Admitted From: SNF Disposition:  SNF   Recommendations for Outpatient Follow-up:  Follow-up with PCP, patient should be seen by an MD in 1 to 2 days, repeat BMP after 1 week to check renal functions, decreased dose of Lasix from 40 twice daily to 40 daily and potassium chloride 20 mEq p.o. daily.  Titrate dose according to patient's volume status.  Continue fluid restriction 1.5 L/day.  Discontinued opiates, discontinued Aricept due to bradycardia.  Avoid AV nodal blockers. Follow up LABS/TEST:     Diet recommendation: Cardiac and Carb modified diet, fluid restriction 1.5 L/day  Activity: The patient is advised to gradually reintroduce usual activities, as tolerated  Discharge Condition: stable  Code Status: Full code   History of present illness: As per the H and P dictated on admission Hospital Course:   PMH of obesity, OSA, GERD, COPD, depression, type II DM, CHF, HTN, hypothyroidism presents to the hospital with complaints of encephalopathy. Had a similar prior presentation at which time it was thought to be secondary to patient's surreptitious use of narcotics causing his admission with confusion episode. Per family concern is that the patient might be requesting his as needed medications at the SNF even if he does not needed and will be collecting it to be used at a later date and will use multiple doses at the same time.  Reportedly he did that with Benadryl earlier this year.  Please do not handover any medication to the patient, and make sure that he is swallowing it.     Assessment and Plan:  # Acute metabolic encephalopathy. Presents with acute  confusional episode.CT of the head unremarkable for any acute abnormality. VBG unremarkable. Mild evidence of AKI s/p IVF Had a similar presentation earlier at which time he was thought to be having issues with polypharmacy. Currently no deficit.  No concerns for seizures. In March 2024 MRI brain negative for acute stroke with similar presentation. Suspecting untreated sleep apnea therefore CPAP nightly was ordered. Patient's home regimen included Flexeril, Lyrica, Norco. Continued Lyrica, Flexeril home dose.  Discontinued Norco on discharge.  Try Tylenol as needed and if pain is not under control then resume Norco cautiously.  Try BiPAP nightly and during nap.  Patient may need sleep study as an outpatient. # AKI, On admission serum creatinine was 1.25, improved after IV fluid.  Continue oral hydration, decreased Lasix dose. # Hypothyroidism: Tsh WNL. Marland Kitchen Resume synthroid 150 mcg daily.  # Hyperlipidemia: Continue with Lipitor 40 mg daily.  # COPD: Continue albuterol as needed. 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  Recommend to monitor CBG and titrate dose of insulin accordingly and continue diabetic diet.  Repeat hemoglobin A1c after 3 months. # Hypertension and Chronic diastolic heart failure: Blood pressure remained stable, patient has mild edema of lower extremities.  Decreased dose of Lasix 40 mg p.o. daily and potassium chloride 20 mEq p.o. daily.  Patient is not on any other antihypertensive medication at this time.  Continue to monitor BP and titrate medications accordingly. # Possible history of hypotension, patient was on midodrine which has  been discontinued, BP remained stable. # Early Dementia: pt was on Aricept, which has been discontinued due to bradycardia.  Continue to monitor heart rate and resume Aricept when bradycardia resolves. # Chronic pain syndrome.  Patient is on Flexeril, Lyrica, and  Norco. Has recurrent presentation with confusion.Concern is that the patient is accumulating his pills and taking them later on his own terms as a larger dose. UDS positive for opioids and TCA (Flexeril can give false positive for TCA) continued Flexeril and Lyrica, discontinued oxycodone for now, continue to use Tylenol as needed and Norco can be resumed if pain is not controlled but patient is to be observed directly to make sure he is swallowing medications at that time. # OSA, Patient consistent with using CPAP here in the hospital. Unsure if he is consistent with that outside of the hospital but certainly can lead to encephalopathy. At the time of admission VBG did not show severe hypercarbia. Currently tolerating CPAP well. Recommend to continue outpatient CPAP at the facility.  Body mass index is 36.99 kg/m.  Nutrition Problem: Inadequate oral intake Etiology: poor appetite Nutrition Interventions: Interventions: Glucerna shake, MVI  Pressure Injury 10/26/20 Perineum Right Stage 2 -  Partial thickness loss of dermis presenting as a shallow open injury with a red, pink wound bed without slough. dry (Active)  10/26/20 1709  Location: Perineum  Location Orientation: Right  Staging: Stage 2 -  Partial thickness loss of dermis presenting as a shallow open injury with a red, pink wound bed without slough.  Wound Description (Comments): dry  Present on Admission: Yes     Pressure Injury 11/05/20 Nose Mid;Upper Unstageable - Full thickness tissue loss in which the base of the injury is covered by slough (yellow, tan, gray, green or brown) and/or eschar (tan, brown or black) in the wound bed. (Active)  11/05/20 0800  Location: Nose  Location Orientation: Mid;Upper  Staging: Unstageable - Full thickness tissue loss in which the base of the injury is covered by slough (yellow, tan, gray, green or brown) and/or eschar (tan, brown or black) in the wound bed.   Wound Description (Comments):   Present on Admission: No     Pressure Injury 11/17/20 Back Left Stage 2 -  Partial thickness loss of dermis presenting as a shallow open injury with a red, pink wound bed without slough. skin tear (Active)  11/17/20 1019  Location: Back  Location Orientation: Left  Staging: Stage 2 -  Partial thickness loss of dermis presenting as a shallow open injury with a red, pink wound bed without slough.  Wound Description (Comments): skin tear  Present on Admission: No     Pressure Injury 02/05/23 Buttocks Left Stage 2 -  Partial thickness loss of dermis presenting as a shallow open injury with a red, pink wound bed without slough. (Active)  02/05/23 1504  Location: Buttocks  Location Orientation: Left  Staging: Stage 2 -  Partial thickness loss of dermis presenting as a shallow open injury with a red, pink wound bed without slough.  Wound Description (Comments):   Present on Admission:      Patient was seen by physical therapy, who recommended Therapy, SNF placement, which was arranged. On the day of the discharge the patient's vitals were stable, and no other acute medical condition were reported by patient. the patient was felt safe to be discharge at [facility].  Consultants: None Procedures: None  Discharge Exam: General: Appear in no distress, no Rash; Oral Mucosa Clear, moist. Cardiovascular: S1  and S2 Present, no Murmur, Respiratory: normal respiratory effort, Bilateral Air entry present and no Crackles, no wheezes Abdomen: Bowel Sound present, Soft and no tenderness, no hernia Extremities: 1-2+ Pedal edema, no calf tenderness Neurology: alert and oriented to time, place, and person affect appropriate.  Filed Weights   03/18/23 1343 03/18/23 2300  Weight: (!) 138.9 kg 130.7 kg   Vitals:   03/24/23 0607 03/24/23 0752  BP: (!) 133/55 139/66  Pulse: (!) 51 (!) 51  Resp: 19 20  Temp: 98.2 F (36.8 C) 97.7 F (36.5 C)  SpO2: 98% 95%    DISCHARGE  MEDICATION: Allergies as of 03/24/2023   No Known Allergies      Medication List     STOP taking these medications    colchicine 0.6 MG tablet   donepezil 10 MG tablet Commonly known as: ARICEPT   Fish Oil 1000 MG Caps   HYDROcodone-acetaminophen 5-325 MG tablet Commonly known as: NORCO/VICODIN   insulin aspart 100 UNIT/ML injection Commonly known as: novoLOG   isosorbide dinitrate 10 MG tablet Commonly known as: ISORDIL   ketoconazole 2 % shampoo Commonly known as: NIZORAL   midodrine 5 MG tablet Commonly known as: PROAMATINE   Mirvaso 0.33 % Gel Generic drug: Brimonidine Tartrate   nystatin powder Generic drug: nystatin   traZODone 50 MG tablet Commonly known as: DESYREL       TAKE these medications    albuterol 108 (90 Base) MCG/ACT inhaler Commonly known as: VENTOLIN HFA Inhale 2 puffs into the lungs every 6 (six) hours as needed for wheezing or shortness of breath.   atorvastatin 40 MG tablet Commonly known as: LIPITOR Take 1 tablet (40 mg total) by mouth daily.   Benefiber On The GO Powd Dissolve one packet in mouth daily.   bisacodyl 10 MG suppository Commonly known as: DULCOLAX Place 10 mg rectally daily as needed for moderate constipation.   cetirizine 10 MG tablet Commonly known as: ZYRTEC Take 10 mg by mouth daily.   Chloraseptic Max Sore Throat 15-10 MG Lozg Generic drug: Benzocaine-Menthol Take 1 lozenge by mouth every 4 (four) hours as needed (sore throat).   clopidogrel 75 MG tablet Commonly known as: PLAVIX Take 1 tablet (75 mg total) by mouth daily.   cyclobenzaprine 10 MG tablet Commonly known as: FLEXERIL Take 10 mg by mouth every 8 (eight) hours as needed for muscle spasms.   ergocalciferol 1.25 MG (50000 UT) capsule Commonly known as: VITAMIN D2 Take 50,000 Units by mouth every Wednesday. Take every Monday and Wednesday   FLUoxetine 40 MG capsule Commonly known as: PROZAC Take 40 mg by mouth daily.   furosemide  40 MG tablet Commonly known as: LASIX Take 1 tablet (40 mg total) by mouth daily. What changed: when to take this   GenTeal Tears 0.1-0.2-0.3 % Soln Place 1 drop into both eyes in the morning, at noon, in the evening, and at bedtime.   hydrocortisone 2.5 % cream Apply 1 application topically every 12 (twelve) hours as needed (per rectum, hemorrhoids).   insulin glargine-yfgn 100 UNIT/ML injection Commonly known as: SEMGLEE Inject 0.3 mLs (30 Units total) into the skin at bedtime.   insulin lispro 100 UNIT/ML injection Commonly known as: HUMALOG Inject 10 Units into the skin 3 (three) times daily before meals.   insulin lispro 100 UNIT/ML injection Commonly known as: HUMALOG Inject per sliding scale 4 times daily. 100-149; 2 units, 150-199; 3 units, 200-249; 4 units, 250-299; 5 units, 300-349; 6 units, 350-399; 7  units, 400-449; 8 units, 450-499; 9 units, 500 and greater, call MD.   levothyroxine 150 MCG tablet Commonly known as: SYNTHROID Take 150 mcg by mouth at bedtime.   Linzess 290 MCG Caps capsule Generic drug: linaclotide Take 290 mcg by mouth daily.   magnesium oxide 400 MG tablet Commonly known as: MAG-OX Take 400 mg by mouth 2 (two) times daily.   Olopatadine HCl 0.7 % Soln Place 1 drop into both eyes daily.   pantoprazole 40 MG tablet Commonly known as: PROTONIX Take 1 tablet (40 mg total) by mouth daily. What changed: when to take this   polyethylene glycol 17 g packet Commonly known as: MIRALAX / GLYCOLAX Take 17 g by mouth daily. Mix one tablespoon with 8oz of your favorite juice or water every day until you are having soft formed stools. Then start taking once daily if you didn't have a stool the day before.   potassium chloride SA 20 MEQ tablet Commonly known as: KLOR-CON M Take 1 tablet (20 mEq total) by mouth daily. What changed: when to take this   pregabalin 300 MG capsule Commonly known as: LYRICA Take 300 mg by mouth 2 (two) times daily.    risperiDONE 0.5 MG tablet Commonly known as: RISPERDAL Take 1 tablet (0.5 mg total) by mouth at bedtime.   senna-docusate 8.6-50 MG tablet Commonly known as: Senokot-S Take 1 tablet by mouth 2 (two) times daily.   simethicone 125 MG chewable tablet Commonly known as: MYLICON Chew 125 mg by mouth 2 (two) times daily.   SitaGLIPtin-MetFORMIN HCl [id number] MG Tb24 Take 1 tablet by mouth daily.   Wixela Inhub 250-50 MCG/ACT Aepb Generic drug: fluticasone-salmeterol Inhale 1 puff into the lungs in the morning and at bedtime.       No Known Allergies Discharge Instructions     Call MD for:  difficulty breathing, headache or visual disturbances   Complete by: As directed    Call MD for:  extreme fatigue   Complete by: As directed    Call MD for:  persistant dizziness or light-headedness   Complete by: As directed    Call MD for:  severe uncontrolled pain   Complete by: As directed    Call MD for:  temperature >100.4   Complete by: As directed    Diet - low sodium heart healthy   Complete by: As directed    Discharge instructions   Complete by: As directed    Follow-up with PCP, patient should be seen by an MD in 1 to 2 days, repeat BMP after 1 week to check renal functions, decreased dose of Lasix from 40 twice daily to 40 daily and potassium chloride 20 mEq p.o. daily.  Titrate dose according to patient's volume status.  Continue fluid restriction 1.5 L/day.  Discontinued opiates, discontinued Aricept due to bradycardia.  Avoid AV nodal blockers.   Increase activity slowly   Complete by: As directed        The results of significant diagnostics from this hospitalization (including imaging, microbiology, ancillary and laboratory) are listed below for reference.    Significant Diagnostic Studies: EEG adult  Result Date: 2023/04/06 Charlsie Quest, MD     04-06-23 12:53 PM Patient Name: BLUFORD KUBES MRN: [id number] Epilepsy Attending: Charlsie Quest Referring  Physician/Provider: Nolberto Hanlon, MD Date: Apr 06, 2023 Duration: 25.58 mins Patient history: 77yo M with ams getting eeg to evaluate for seizure Level of alertness: Awake, asleep AEDs during EEG study: None Technical aspects: This EEG  study was done with scalp electrodes positioned according to the 10-20 International system of electrode placement. Electrical activity was reviewed with band pass filter of 1-70Hz , sensitivity of 7 uV/mm, display speed of 28mm/sec with a 60Hz  notched filter applied as appropriate. EEG data were recorded continuously and digitally stored.  Video monitoring was available and reviewed as appropriate. Description: The posterior dominant rhythm consists of 7.5 Hz activity of moderate voltage (25-35 uV) seen predominantly in posterior head regions, symmetric and reactive to eye opening and eye closing. Sleep was characterized by vertex waves, sleep spindles (12 to 14 Hz), maximal frontocentral region.  EEG showed intermittent generalized 3 to 6 Hz theta-delta slowing.ow any EEG change. Hyperventilation and photic stimulation were not performed.   ABNORMALITY - Intermittent slow, generalized IMPRESSION: This study is suggestive of mild diffuse encephalopathy, nonspecific etiology. No seizures or epileptiform discharges were seen throughout the recording. Priyanka Annabelle Harman   CT HEAD WO CONTRAST ( )  Result Date: 03/18/2023 CLINICAL DATA:  Mental status change, unknown cause. EXAM: CT HEAD WITHOUT CONTRAST TECHNIQUE: Contiguous axial images were obtained from the base of the skull through the vertex without intravenous contrast. RADIATION DOSE REDUCTION: This exam was performed according to the departmental dose-optimization program which includes automated exposure control, adjustment of the mA and/or kV according to patient size and/or use of iterative reconstruction technique. COMPARISON:  Head CT 02/04/2023.  MRI brain 02/05/2023. FINDINGS: Brain: No acute hemorrhage. Unchanged chronic  small-vessel disease. Cortical gray-Harlacher differentiation is otherwise preserved. Prominence of the ventricles and sulci within expected range for age. No hydrocephalus or extra-axial collection. No mass effect or midline shift. Vascular: No hyperdense vessel or unexpected calcification. Skull: No calvarial fracture or suspicious bone lesion. Skull base is unremarkable. Sinuses/Orbits: Mild mucosal disease throughout the paranasal sinuses. Orbits are unremarkable. Mastoids are well aerated. Other: None. IMPRESSION: No acute intracranial abnormality. Electronically Signed   By: Orvan Falconer M.D.   On: 03/18/2023 14:47   DG Chest Portable 1 View  Result Date: 03/18/2023 CLINICAL DATA:  Shortness of breath. EXAM: PORTABLE CHEST 1 VIEW COMPARISON:  02/04/2023. FINDINGS: Low lung volumes accentuate the pulmonary vasculature and cardiomediastinal silhouette. No consolidation. Possible mild superimposed pulmonary venous congestion. No pleural effusion or pneumothorax. IMPRESSION: Low lung volumes with possible mild superimposed pulmonary venous congestion. Electronically Signed   By: Orvan Falconer M.D.   On: 03/18/2023 14:35    Microbiology: No results found for this or any previous visit (from the past 240 hour(s)).   Labs: CBC: Recent Labs  Lab 03/18/23 1422 03/19/23 0533 03/21/23 0547 03/22/23 0516  WBC 9.1 9.8 6.4 6.9  NEUTROABS 6.9  --  4.5 4.8  HGB 13.9 13.2 12.7* 12.7*  HCT 43.8 41.9 39.9 39.5  MCV 87.8 88.0 87.9 87.8  PLT 189 173 185 156   Basic Metabolic Panel: Recent Labs  Lab 03/18/23 1345 03/19/23 0533 03/21/23 0547 03/22/23 0516  NA 141 141 139 140  K 4.0 3.6 3.3* 3.5  CL 104 104 107 108  CO2 28 25 24 26   GLUCOSE 124* 145* 128* 169*  BUN 20 21 12 17   CREATININE 1.25* 1.00 0.81 0.91  CALCIUM 9.1 8.9 9.0 8.8*  MG  --  1.9 2.1  --    Liver Function Tests: Recent Labs  Lab 03/18/23 1345 03/21/23 0547  AST 23 19  ALT 22 19  ALKPHOS 104 92  BILITOT 1.0 1.5*  PROT  6.9 6.3*  ALBUMIN 3.2* 3.2*   No results for input(s): "LIPASE", "  AMYLASE" in the last 168 hours. No results for input(s): "AMMONIA" in the last 168 hours. Cardiac Enzymes: No results for input(s): "CKTOTAL", "CKMB", "CKMBINDEX", "TROPONINI" in the last 168 hours. BNP (last 3 results) No results for input(s): "BNP" in the last 8760 hours. CBG: Recent Labs  Lab 03/23/23 1739 03/23/23 2023 03/23/23 2120 03/24/23 0756 03/24/23 1140  GLUCAP 185* 149* 124* 120* 199*    Time spent: 35 minutes  Signed:  Gillis Santa  [facility]  03/24/2023 1:28 PM

## 2023-03-24 NOTE — Care Management Important Message (Signed)
Important Message  Patient Details  Name: [name] [name] MRN: [id number] Date of Birth: July 09, 1946   Medicare Important Message Given:  N/A - LOS <3 / Initial given by admissions     Johnell Comings 03/24/2023, 8:38 AM

## 2023-06-03 ENCOUNTER — Emergency Department
Admission: EM | Admit: 2023-06-03 | Discharge: 2023-06-03 | Disposition: A | Discharge status: Home / Self Care | Payer: Medicare Other | Attending: Emergency Medicine | Admitting: Emergency Medicine

## 2023-06-03 ENCOUNTER — Emergency Department: Payer: Medicare Other

## 2023-06-03 ENCOUNTER — Other Ambulatory Visit: Payer: Self-pay

## 2023-06-03 DIAGNOSIS — I509 Heart failure, unspecified: Secondary | ICD-10-CM | POA: Diagnosis not present

## 2023-06-03 DIAGNOSIS — E119 Type 2 diabetes mellitus without complications: Secondary | ICD-10-CM | POA: Insufficient documentation

## 2023-06-03 DIAGNOSIS — J449 Chronic obstructive pulmonary disease, unspecified: Secondary | ICD-10-CM | POA: Insufficient documentation

## 2023-06-03 DIAGNOSIS — R251 Tremor, unspecified: Secondary | ICD-10-CM | POA: Insufficient documentation

## 2023-06-03 DIAGNOSIS — I110 Hypertensive heart disease with heart failure: Secondary | ICD-10-CM | POA: Insufficient documentation

## 2023-06-03 LAB — BASIC METABOLIC PANEL
Anion gap: 10 (ref 5–15)
BUN: 25 mg/dL — ABNORMAL HIGH (ref 8–23)
CO2: 25 mmol/L (ref 22–32)
Calcium: 8.8 mg/dL — ABNORMAL LOW (ref 8.9–10.3)
Chloride: 108 mmol/L (ref 98–111)
Creatinine, Ser: 1.21 mg/dL (ref 0.61–1.24)
GFR, Estimated: >60 mL/min (ref >60)
Glucose, Bld: 108 mg/dL — ABNORMAL HIGH (ref 70–99)
Potassium: 4.4 mmol/L (ref 3.5–5.1)
Sodium: 143 mmol/L (ref 135–145)

## 2023-06-03 LAB — URINALYSIS, ROUTINE W REFLEX MICROSCOPIC
Bilirubin Urine: NEGATIVE
Glucose, UA: NEGATIVE mg/dL
Hgb urine dipstick: NEGATIVE
Ketones, ur: NEGATIVE mg/dL
Leukocytes,Ua: NEGATIVE
Nitrite: NEGATIVE
Protein, ur: NEGATIVE mg/dL
Specific Gravity, Urine: 1.014 (ref 1.005–1.030)
pH: 5 (ref 5.0–8.0)

## 2023-06-03 LAB — CBC
HCT: 42.7 % (ref 39.0–52.0)
Hemoglobin: 13.4 g/dL (ref 13.0–17.0)
MCH: 28 pg (ref 26.0–34.0)
MCHC: 31.4 g/dL (ref 30.0–36.0)
MCV: 89.1 fL (ref 80.0–100.0)
Platelets: 171 10*3/uL (ref 150–400)
RBC: 4.79 MIL/uL (ref 4.22–5.81)
RDW: 14.9 % (ref 11.5–15.5)
WBC: 8.2 10*3/uL (ref 4.0–10.5)
nRBC: 0 % (ref 0.0–0.2)

## 2023-06-03 LAB — CBG MONITORING, ED
Glucose-Capillary: 86 mg/dL (ref 70–99)
Glucose-Capillary: 80 mg/dL (ref 70–99)

## 2023-06-03 NOTE — ED Triage Notes (Signed)
Patient to ED from [facility] via EMS for altered mental status; facility reported patient had an episode of jerking movements and lethargy with history of the same. Now patient at baseline per EMS.   BP 133/76 HR 58 O2 97% BG 95

## 2023-06-03 NOTE — ED Notes (Addendum)
Called [facility] and spoke to [employer]. [employer] patient's speech does not usually have slurred and is not hard to understand. Informed provider Paduchowski. See orders

## 2023-06-03 NOTE — ED Notes (Signed)
 Provider at bedside at this time

## 2023-06-03 NOTE — ED Notes (Signed)
 No changes noted at this time.

## 2023-06-03 NOTE — ED Notes (Signed)
Per dr.paduchowski, patient able to liquids to drink.

## 2023-06-03 NOTE — ED Notes (Signed)
Per provider Paduchowski, straight cath patient for UA. Patient agreeable to straight catheterization at this time.

## 2023-06-03 NOTE — ED Notes (Signed)
Report given to SNF [facility]. Report given to [employer] where pt resides. Pt continues to wait for transport.

## 2023-06-03 NOTE — ED Notes (Signed)
Spoke to annette about setting transport up to [facility]

## 2023-06-03 NOTE — ED Notes (Signed)
 Patient to MRI at this time.

## 2023-06-03 NOTE — ED Notes (Signed)
[facility] at bedside. [facility] SNF updated of pts depature form ED at this time.

## 2023-06-03 NOTE — ED Notes (Signed)
Gold DNR provided to [facility]

## 2023-06-03 NOTE — ED Notes (Signed)
No changes noted at this time. Patient still has slurred speech intermittently, at times.

## 2023-06-03 NOTE — Discharge Instructions (Addendum)
Your workup in the emergency department is show normal results including blood work and a CT scan and MRI of the head.  Please return to the emergency department for any symptoms concerning to yourself or staff members.  Otherwise please follow-up with your doctor.

## 2023-06-03 NOTE — ED Notes (Addendum)
Called report to [facility] and spoke to patient's nurse crystal about patient returning back to facility

## 2023-06-03 NOTE — ED Provider Notes (Addendum)
[facility] Provider Note    Event Date/Time   First MD Initiated Contact with Patient 06/03/23 1405     (approximate)  History   Chief Complaint: Altered Mental Status  HPI  [name] [name] is a 77 y.o. male with a past medical history of CHF, COPD, diabetes, dysphagia, gastric reflux, hypertension, presents to the emergency department for an episode of shaking this morning.  According to the report patient was at his nursing facility when they noted a shaking/jerking movements and sent the patient to the emergency department for evaluation.  Patient states he has been diagnosed with a tremor and this has been occurring intermittently over the past 2 to 3 years.  Currently patient appears well, no distress.  Answering questions appropriately.  Physical Exam   Triage Vital Signs: ED Triage Vitals  Encounter Vitals Group     BP 06/03/23 1313 (!) 157/78     Systolic BP Percentile --      Diastolic BP Percentile --      Pulse Rate 06/03/23 1313 (!) 58     Resp 06/03/23 1313 20     Temp 06/03/23 1313 97.8 F (36.6 C)     Temp src --      SpO2 06/03/23 1313 96 %     Weight 06/03/23 1310 275 lb 9.2 oz (125 kg)     Height 06/03/23 1310 6\' 2"  (1.88 m)     Head Circumference --      Peak Flow --      Pain Score --      Pain Loc --      Pain Education --      Exclude from Growth Chart --     Most recent vital signs: Vitals:   06/03/23 1313 06/03/23 1536  BP: (!) 157/78 (!) 154/84  Pulse: (!) 58 (!) 56  Resp: 20 18  Temp: 97.8 F (36.6 C)   SpO2: 96% 94%    General: Awake, no distress.  CV:  Good peripheral perfusion.  Regular rate and rhythm  Resp:  Normal effort.  Equal breath sounds bilaterally.  Abd:  No distention.  Soft, nontender.  No rebound or guarding.  ED Results / Procedures / Treatments   EKG  Patient is EKG viewed and interpreted by myself shows sinus bradycardia 57 bpm with a widened QRS, left axis deviation, largely normal  intervals with nonspecific ST changes.  No ST elevation.  RADIOLOGY  I reviewed interpret the CT head images.  No bleed seen on my evaluation. Radiology has read the CT scan as negative for acute abnormality.   MEDICATIONS ORDERED IN ED: Medications - No data to display   IMPRESSION / MDM / ASSESSMENT AND PLAN / ED COURSE  I reviewed the triage vital signs and the nursing notes.  Patient's presentation is most consistent with acute presentation with potential threat to life or bodily function.  Patient presents to the emergency department from his nursing facility for jerking movements made earlier today.  Overall the patient appears well.  He states he has a diagnosed tremor and will have jerking movements intermittently over the last 2 to 3 years.  Patient denies any changes today.  Workup in the emergency department shows a normal urinalysis reassuring chemistry reassuring CBC.  CT scan of the head shows no acute abnormality, EKG shows no significant finding/change.  Patient states he feels well and is ready to go back to his facility.  Will discharge back to his  care facility.  I spoke to the patient's care facility who states the patient has had a change in his speech.  Patient has a slurring to his speech in the emergency department.  They state they believe this is new or worsened.  Will attempt to obtain an MRI to further evaluate to rule out acute CVA.  Patient's MRI is normal.  Patient had a very dry mouth was given water and after being able to drink he is now speaking very clearly.  States it feels/sounds like his normal speech.  Given his normal MRI and reassuring workup otherwise I believe the patient is safe for discharge back to his facility.  Patient agreeable to plan as well.  FINAL CLINICAL IMPRESSION(S) / ED DIAGNOSES   Tremor   Note:  This document was prepared using Dragon voice recognition software and may include unintentional dictation errors.   Minna Antis, MD 06/03/23 1553    Minna Antis, MD 06/03/23 1810

## 2023-06-03 NOTE — ED Notes (Signed)
Straight catheterization done on patient with assistance from tech. 47F straight tip catheter used. Brief and bottom incontinence pad changed, as well.  Output 700 ml

## 2023-06-03 NOTE — ED Notes (Signed)
Patient speech appears clearer after intake of orange juice and a cup of water.

## 2023-07-02 ENCOUNTER — Ambulatory Visit: Payer: Medicare Other | Admitting: Certified Registered"

## 2023-07-02 ENCOUNTER — Encounter
Admission: RE | Disposition: A | Discharge status: Home / Self Care | Payer: Self-pay | Source: Home / Self Care | Attending: Gastroenterology

## 2023-07-02 ENCOUNTER — Encounter: Payer: Self-pay | Admitting: Gastroenterology

## 2023-07-02 ENCOUNTER — Ambulatory Visit
Admission: RE | Admit: 2023-07-02 | Discharge: 2023-07-02 | Disposition: A | Discharge status: Home / Self Care | Payer: Medicare Other | Attending: Gastroenterology | Admitting: Gastroenterology

## 2023-07-02 DIAGNOSIS — Q399 Congenital malformation of esophagus, unspecified: Secondary | ICD-10-CM | POA: Diagnosis not present

## 2023-07-02 DIAGNOSIS — Z7951 Long term (current) use of inhaled steroids: Secondary | ICD-10-CM | POA: Insufficient documentation

## 2023-07-02 DIAGNOSIS — E119 Type 2 diabetes mellitus without complications: Secondary | ICD-10-CM | POA: Insufficient documentation

## 2023-07-02 DIAGNOSIS — Z8546 Personal history of malignant neoplasm of prostate: Secondary | ICD-10-CM | POA: Insufficient documentation

## 2023-07-02 DIAGNOSIS — Z7902 Long term (current) use of antithrombotics/antiplatelets: Secondary | ICD-10-CM | POA: Diagnosis not present

## 2023-07-02 DIAGNOSIS — Z794 Long term (current) use of insulin: Secondary | ICD-10-CM | POA: Diagnosis not present

## 2023-07-02 DIAGNOSIS — J449 Chronic obstructive pulmonary disease, unspecified: Secondary | ICD-10-CM | POA: Insufficient documentation

## 2023-07-02 DIAGNOSIS — K224 Dyskinesia of esophagus: Secondary | ICD-10-CM | POA: Insufficient documentation

## 2023-07-02 DIAGNOSIS — I509 Heart failure, unspecified: Secondary | ICD-10-CM | POA: Insufficient documentation

## 2023-07-02 DIAGNOSIS — R1310 Dysphagia, unspecified: Secondary | ICD-10-CM | POA: Diagnosis not present

## 2023-07-02 DIAGNOSIS — I110 Hypertensive heart disease with heart failure: Secondary | ICD-10-CM | POA: Insufficient documentation

## 2023-07-02 DIAGNOSIS — Z85850 Personal history of malignant neoplasm of thyroid: Secondary | ICD-10-CM | POA: Diagnosis not present

## 2023-07-02 DIAGNOSIS — K2980 Duodenitis without bleeding: Secondary | ICD-10-CM | POA: Insufficient documentation

## 2023-07-02 DIAGNOSIS — G4730 Sleep apnea, unspecified: Secondary | ICD-10-CM | POA: Insufficient documentation

## 2023-07-02 DIAGNOSIS — K219 Gastro-esophageal reflux disease without esophagitis: Secondary | ICD-10-CM | POA: Diagnosis not present

## 2023-07-02 HISTORY — PX: ESOPHAGOGASTRODUODENOSCOPY (EGD) WITH PROPOFOL: SHX5813

## 2023-07-02 HISTORY — PX: BIOPSY: SHX5522

## 2023-07-02 LAB — GLUCOSE, CAPILLARY: Glucose-Capillary: 108 mg/dL — ABNORMAL HIGH (ref 70–99)

## 2023-07-02 SURGERY — ESOPHAGOGASTRODUODENOSCOPY (EGD) WITH PROPOFOL
Anesthesia: General

## 2023-07-02 MED ORDER — LIDOCAINE HCL (CARDIAC) PF 100 MG/5ML IV SOSY
PREFILLED_SYRINGE | INTRAVENOUS | Status: DC | PRN
  Administered 2023-07-02: 100 mg via INTRAVENOUS

## 2023-07-02 MED ORDER — PROPOFOL 500 MG/50ML IV EMUL
INTRAVENOUS | Status: DC | PRN
  Administered 2023-07-02: 145 ug/kg/min via INTRAVENOUS

## 2023-07-02 MED ORDER — GLYCOPYRROLATE 0.2 MG/ML IJ SOLN
INTRAMUSCULAR | Status: DC | PRN
  Administered 2023-07-02: .2 mg via INTRAVENOUS

## 2023-07-02 MED ORDER — SODIUM CHLORIDE 0.9 % IV SOLN: INTRAVENOUS | Status: DC

## 2023-07-02 MED ORDER — PROPOFOL 10 MG/ML IV BOLUS
INTRAVENOUS | Status: DC | PRN
Start: 2023-07-02 — End: 2023-07-02
  Administered 2023-07-02: 30 mg via INTRAVENOUS
  Administered 2023-07-02: 70 mg via INTRAVENOUS

## 2023-07-02 NOTE — H&P (Signed)
Pre-Procedure H&P   Patient ID: [name] [name] is a 77 y.o. male.  Gastroenterology Provider: Jaynie Collins, DO  Referring Provider: Tawni Pummel, PA PCP: Sherol Dade, DO  Date: 07/02/2023  HPI [name] is a 77 y.o. male who presents today for Esophagogastroduodenoscopy for Dysphagia, GERD, bowel habit changes.  Has had difficulty with pills, solids. No issues with liquids or odynophagia. This has been an issue in the past as well requiring dilation.  Notes some increasing reflux and regurgitation, but no weight loss.  Has had loose stools to constipation alternating.  Reports plavix held 1 week.    Past Medical History:  Diagnosis Date   Anxiety disorder 05/05/2014   unspecifed   Arthritis    CHF (congestive heart failure) (HCC)    Colonic polyp    COPD (chronic obstructive pulmonary disease) (HCC)    Depression    Diabetes mellitus without complication (HCC)    Dysphagia    Dyspnea    Elevated prostate specific antigen (PSA)    Essential (primary) hypertension 05/05/2014   GERD (gastroesophageal reflux disease)    GI bleed    due to diverticulitis    Heart murmur    hx of 1960s   Hemorrhoids    History of thyroid cancer 05/05/2014   Hypercholesteremia    Hyperlipidemia    Hypertension    Hypothyroidism 05/05/2014   Male erectile dysfunction 12/29/2012   Malignant neoplasm of prostate (HCC)    10/30/2011 T1c - Identified by needle biopsy . Gleason 7 (3+4) 5 of 12 cores, Bilateral    Pneumonia    Prediabetes 06/22/2017   Sleep apnea    CPAP , report on chart , not used in last 2 weeks    Urinary obstruction    not elsewhere classified    Past Surgical History:  Procedure Laterality Date   BACK SURGERY     lumbar 1987    BACK SURGERY     C-6- Plate&Pin   CATARACT EXTRACTION W/PHACO Right 09/15/2018   Procedure: CATARACT EXTRACTION PHACO AND INTRAOCULAR LENS PLACEMENT (IOC) RIGHT;  Surgeon: Lockie Mola, MD;   Location[facility];  Service: Ophthalmology;  Laterality: Right;  Requests to be last case   CATARACT EXTRACTION W/PHACO Left 10/06/2018   Procedure: CATARACT EXTRACTION PHACO AND INTRAOCULAR LENS PLACEMENT (IOC)  LEFT;  Surgeon: Lockie Mola, MD;  Location: [facility];  Service: Ophthalmology;  Laterality: Left;   CERVICAL DISC SURGERY     COLONOSCOPY     10/06/2011, 06/05/2006, 05/06/2002 adematous polyps: CBF 09/2014; Recall Ltr mailed 02/27/2015 (dw)   COLONOSCOPY WITH PROPOFOL N/A 07/06/2015   Procedure: COLONOSCOPY WITH PROPOFOL;  Surgeon: Scot Jun, MD;  Location: [facility] END[facility]COPY;  Service: Endoscopy;  Laterality: N/A;   COLONOSCOPY WITH PROPOFOL N/A 09/01/2018   Procedure: COLONOSCOPY WITH PROPOFOL;  Surgeon: Scot Jun, MD;  Location: [facility] ENDOSCOPY;  Service: Endoscopy;  Laterality: N/A;   COLONOSCOPY WITH PROPOFOL N/A 04/17/2020   Procedure: COLONOSCOPY WITH PROPOFOL;  Surgeon: Midge Minium, MD;  Location: [facility] ENDOSCOPY;  Service: Endoscopy;  Laterality: N/A;   ESOPHAGOGASTRODUODENOSCOPY (EGD) WITH PROPOFOL N/A 04/17/2020   Procedure: ESOPHAGOGASTRODUODENOSCOPY (EGD) WITH PROPOFOL;  Surgeon: Midge Minium, MD;  Location: [facility] ENDOSCOPY;  Service: Endoscopy;  Laterality: N/A;   EYE SURGERY     HERNIA REPAIR     umbilical hernia repair    LAMINECTOMY FOR EXCISION / EVACUATION INTRASPINAL LESION     lumbar   OTHER SURGICAL HISTORY  right rotator cuff surgery    OTHER SURGICAL HISTORY     bilateral tubes in ears    POSTERIOR CERVICAL LAMINECTOMY N/A 10/27/2020   Procedure: POSTERIOR CERVICAL LAMINECTOMY CERVICAL THREE-CERVICAL SIX;  Surgeon: Jadene Pierini, MD;  Location: [facility] OR;  Service: Neurosurgery;  Laterality: N/A;   POSTERIOR LAMINECTOMY / DECOMPRESSION CERVICAL SPINE     PROSTATE BIOPSY     10/22/2011 Volume:55.8 cc's, PSA:4.5, Free PSA:12%   ROBOT ASSISTED LAPAROSCOPIC RADICAL PROSTATECTOMY  12/15/2011   Procedure: ROBOTIC ASSISTED  LAPAROSCOPIC RADICAL PROSTATECTOMY LEVEL 2;  Surgeon: Crecencio Mc, MD;  Location: [facility] ORS;  Service: Urology;  Laterality: N/A;       ROTATOR CUFF REPAIR Right    SHOULDER ARTHROSCOPY WITH SUBACROMIAL DECOMPRESSION, ROTATOR CUFF REPAIR AND BICEP TENDON REPAIR Left 11/07/2021   Procedure: SHOULDER ARTHROSCOPY WITH DEBRIDEMENT, DECOMPRESSION, ROTATOR CUFF REPAIR AND BICEPS TENODESIS.;  Surgeon: Christena Flake, MD;  Location: [facility] ORS;  Service: Orthopedics;  Laterality: Left;   TONSILLECTOMY     TOTAL THYROIDECTOMY      Family History No h/o GI disease or malignancy  Review of Systems  Constitutional:  Negative for activity change, appetite change, chills, diaphoresis, fatigue, fever and unexpected weight change.  HENT:  Positive for trouble swallowing. Negative for voice change.   Respiratory:  Negative for shortness of breath and wheezing.   Cardiovascular:  Negative for chest pain, palpitations and leg swelling.  Gastrointestinal:  Positive for constipation and diarrhea. Negative for abdominal distention, abdominal pain, anal bleeding, blood in stool, nausea and vomiting.  Musculoskeletal:  Negative for arthralgias and myalgias.  Skin:  Negative for color change and pallor.  Neurological:  Negative for dizziness, syncope and weakness.  Psychiatric/Behavioral:  Negative for confusion. The patient is not nervous/anxious.   All other systems reviewed and are negative.    Medications No current facility-administered medications on file prior to encounter.   Current Outpatient Medications on File Prior to Encounter  Medication Sig Dispense Refill   albuterol (VENTOLIN HFA) 108 (90 Base) MCG/ACT inhaler Inhale 2 puffs into the lungs every 6 (six) hours as needed for wheezing or shortness of breath.     Artificial Tear Solution (GENTEAL TEARS) 0.1-0.2-0.3 % SOLN Place 1 drop into both eyes in the morning, at noon, in the evening, and at bedtime.     atorvastatin (LIPITOR) 40 MG tablet Take 1  tablet (40 mg total) by mouth daily. 30 tablet 0   bisacodyl (DULCOLAX) 10 MG suppository Place 10 mg rectally daily as needed for moderate constipation.     clopidogrel (PLAVIX) 75 MG tablet Take 1 tablet (75 mg total) by mouth daily. 30 tablet 2   ergocalciferol (VITAMIN D2) 1.25 MG (50000 UT) capsule Take 50,000 Units by mouth every Wednesday. Take every Monday and Wednesday     FLUoxetine (PROZAC) 40 MG capsule Take 40 mg by mouth daily.     fluticasone-salmeterol (WIXELA INHUB) 250-50 MCG/ACT AEPB Inhale 1 puff into the lungs in the morning and at bedtime.     hydrocortisone 2.5 % cream Apply 1 application topically every 12 (twelve) hours as needed (per rectum, hemorrhoids).     insulin glargine-yfgn (SEMGLEE) 100 UNIT/ML injection Inject 0.3 mLs (30 Units total) into the skin at bedtime. 10 mL 11   levothyroxine (SYNTHROID) 150 MCG tablet Take 150 mcg by mouth at bedtime.     LINZESS 290 MCG CAPS capsule Take 290 mcg by mouth daily.     magnesium oxide (MAG-OX) 400 MG tablet  Take 400 mg by mouth 2 (two) times daily.     pantoprazole (PROTONIX) 40 MG tablet Take 1 tablet (40 mg total) by mouth daily. (Patient taking differently: Take 40 mg by mouth 2 (two) times daily.) 30 tablet 1   polyethylene glycol (MIRALAX / GLYCOLAX) 17 g packet Take 17 g by mouth daily. Mix one tablespoon with 8oz of your favorite juice or water every day until you are having soft formed stools. Then start taking once daily if you didn't have a stool the day before. 30 each 0   pregabalin (LYRICA) 300 MG capsule Take 300 mg by mouth 2 (two) times daily.     risperiDONE (RISPERDAL) 0.5 MG tablet Take 1 tablet (0.5 mg total) by mouth at bedtime. 30 tablet 0   senna-docusate (SENOKOT-S) 8.6-50 MG tablet Take 1 tablet by mouth 2 (two) times daily.     simethicone (MYLICON) 125 MG chewable tablet Chew 125 mg by mouth 2 (two) times daily.     SitaGLIPtin-MetFORMIN HCl [phone] MG TB24 Take 1 tablet by mouth daily. 30 tablet  2    Pertinent medications related to GI and procedure were reviewed by me with the patient prior to the procedure   Current Facility-Administered Medications:    0.9 %  sodium chloride infusion, , Intravenous, Continuous, Jaynie Collins, DO  sodium chloride         No Known Allergies Allergies were reviewed by me prior to the procedure  Objective   Body mass index is 35.37 kg/m. Vitals:   07/02/23 0925  BP: 115/67  Pulse: (!) 56  Resp: 18  Temp: (!) 96.8 F (36 C)  TempSrc: Temporal  SpO2: 99%  Weight: 128.4 kg  Height: 6\' 3"  (1.905 m)     Physical Exam Vitals and nursing note reviewed.  Constitutional:      General: He is not in acute distress.    Appearance: Normal appearance. He is obese. He is not ill-appearing, toxic-appearing or diaphoretic.  HENT:     Head: Normocephalic and atraumatic.     Nose: Nose normal.     Mouth/Throat:     Mouth: Mucous membranes are moist.     Pharynx: Oropharynx is clear.  Eyes:     General: No scleral icterus.    Extraocular Movements: Extraocular movements intact.  Cardiovascular:     Rate and Rhythm: Regular rhythm. Bradycardia present.     Heart sounds: Normal heart sounds. No murmur heard.    No friction rub. No gallop.  Pulmonary:     Effort: Pulmonary effort is normal. No respiratory distress.     Breath sounds: Normal breath sounds. No wheezing, rhonchi or rales.  Abdominal:     General: Bowel sounds are normal. There is no distension.     Palpations: Abdomen is soft.     Tenderness: There is no abdominal tenderness. There is no guarding or rebound.  Musculoskeletal:     Cervical back: Neck supple.     Right lower leg: No edema.     Left lower leg: No edema.  Skin:    General: Skin is warm and dry.     Coloration: Skin is not jaundiced or pale.  Neurological:     General: No focal deficit present.     Mental Status: He is alert and oriented to person, place, and time. Mental status is at baseline.   Psychiatric:        Mood and Affect: Mood normal.  Behavior: Behavior normal.        Thought Content: Thought content normal.        Judgment: Judgment normal.      Assessment:  [name] [name] is a 77 y.o. male  who presents today for Esophagogastroduodenoscopy for Dysphagia, GERD, bowel habit changes .  Plan:  Esophagogastroduodenoscopy with possible intervention today  Esophagogastroduodenoscopy with possible biopsy, control of bleeding, polypectomy, and interventions as necessary has been discussed with the patient/patient representative. Informed consent was obtained from the patient/patient representative after explaining the indication, nature, and risks of the procedure including but not limited to death, bleeding, perforation, missed neoplasm/lesions, cardiorespiratory compromise, and reaction to medications. Opportunity for questions was given and appropriate answers were provided. Patient/patient representative has verbalized understanding is amenable to undergoing the procedure.   Jaynie Collins, DO  [facility]  Portions of the record may have been created with voice recognition software. Occasional wrong-word or 'sound-a-like' substitutions may have occurred due to the inherent limitations of voice recognition software.  Read the chart carefully and recognize, using context, where substitutions may have occurred.

## 2023-07-02 NOTE — Anesthesia Postprocedure Evaluation (Signed)
Anesthesia Post Note  Patient: [name] [name]  Procedure(s) Performed: ESOPHAGOGASTRODUODENOSCOPY (EGD) WITH PROPOFOL  Patient location during evaluation: Endoscopy Anesthesia Type: General Level of consciousness: awake and alert Pain management: pain level controlled Vital Signs Assessment: post-procedure vital signs reviewed and stable Respiratory status: spontaneous breathing, nonlabored ventilation, respiratory function stable and patient connected to nasal cannula oxygen Cardiovascular status: blood pressure returned to baseline and stable Postop Assessment: no apparent nausea or vomiting Anesthetic complications: no   No notable events documented.   Last Vitals:  Vitals:   07/02/23 1055 07/02/23 1105  BP: 111/72 107/71  Pulse: (!) 54 (!) 55  Resp: 16 14  Temp:    SpO2: 100% 99%    Last Pain:  Vitals:   07/02/23 1105  TempSrc:   PainSc: 0-No pain                 Louie Boston

## 2023-07-02 NOTE — Anesthesia Preprocedure Evaluation (Signed)
Anesthesia Evaluation  Patient identified by MRN, date of birth, ID band Patient awake    Reviewed: Allergy & Precautions, NPO status , Patient's Chart, lab work & pertinent test results  History of Anesthesia Complications Negative for: history of anesthetic complications  Airway Mallampati: III  TM Distance: >3 FB Neck ROM: full    Dental  (+) Chipped   Pulmonary shortness of breath and with exertion, sleep apnea , COPD, Patient abstained from smoking., former smoker   Pulmonary exam normal        Cardiovascular hypertension, +CHF  Normal cardiovascular exam     Neuro/Psych  PSYCHIATRIC DISORDERS Anxiety Depression    TIA Neuromuscular disease    GI/Hepatic Neg liver ROS,GERD  Controlled,,dysphagia   Endo/Other  diabetesHypothyroidism    Renal/GU negative Renal ROS  negative genitourinary   Musculoskeletal   Abdominal   Peds  Hematology negative hematology ROS (+)   Anesthesia Other Findings Past Medical History: 05/05/2014: Anxiety disorder     Comment:  unspecifed No date: Arthritis No date: CHF (congestive heart failure) (HCC) No date: Colonic polyp No date: COPD (chronic obstructive pulmonary disease) (HCC) No date: Depression No date: Diabetes mellitus without complication (HCC) No date: Dysphagia No date: Dyspnea No date: Elevated prostate specific antigen (PSA) 05/05/2014: Essential (primary) hypertension No date: GERD (gastroesophageal reflux disease) No date: GI bleed     Comment:  due to diverticulitis  No date: Heart murmur     Comment:  hx of 1960s No date: Hemorrhoids 05/05/2014: History of thyroid cancer No date: Hypercholesteremia No date: Hyperlipidemia No date: Hypertension 05/05/2014: Hypothyroidism 12/29/2012: Male erectile dysfunction No date: Malignant neoplasm of prostate [facility])     Comment:  10/30/2011 T1c - Identified by needle biopsy . Gleason 7              (3+4) 5 of 12  cores, Bilateral  No date: Pneumonia 06/22/2017: Prediabetes No date: Sleep apnea     Comment:  CPAP , report on chart , not used in last 2 weeks  No date: Urinary obstruction     Comment:  not elsewhere classified  Past Surgical History: No date: BACK SURGERY     Comment:  lumbar 1987  No date: BACK SURGERY     Comment:  C-6- Plate&Pin 38/75/6433: CATARACT EXTRACTION W/PHACO; Right     Comment:  Procedure: CATARACT EXTRACTION PHACO AND INTRAOCULAR               LENS PLACEMENT (IOC) RIGHT;  Surgeon: Lockie Mola, MD;  Location: [facility];  Service:               Ophthalmology;  Laterality: Right;  Requests to be last               case 10/06/2018: CATARACT EXTRACTION W/PHACO; Left     Comment:  Procedure: CATARACT EXTRACTION PHACO AND INTRAOCULAR               LENS PLACEMENT (IOC)  LEFT;  Surgeon: Lockie Mola, MD;  Location: [facility];  Service:               Ophthalmology;  Laterality: Left; No date: CERVICAL DISC SURGERY No date: COLONOSCOPY     Comment:  10/06/2011, 06/05/2006, 05/06/2002 adematous polyps: CBF  09/2014; Recall Ltr mailed 02/27/2015 (dw) 07/06/2015: COLONOSCOPY WITH PROPOFOL; N/A     Comment:  Procedure: COLONOSCOPY WITH PROPOFOL;  Surgeon: Scot Jun, MD;  Location: [facility] ENDOSCOPY;  Service:               Endoscopy;  Laterality: N/A; 09/01/2018: COLONOSCOPY WITH PROPOFOL; N/A     Comment:  Procedure: COLONOSCOPY WITH PROPOFOL;  Surgeon: Scot Jun, MD;  Location: [facility] ENDOSCOPY;  Service:               Endoscopy;  Laterality: N/A; 04/17/2020: COLONOSCOPY WITH PROPOFOL; N/A     Comment:  Procedure: COLONOSCOPY WITH PROPOFOL;  Surgeon: Midge Minium, MD;  Location: [facility] ENDOSCOPY;  Service:               Endoscopy;  Laterality: N/A; 04/17/2020: ESOPHAGOGASTRODUODENOSCOPY (EGD) WITH PROPOFOL; N/A     Comment:  Procedure:  ESOPHAGOGASTRODUODENOSCOPY (EGD) WITH               PROPOFOL;  Surgeon: Midge Minium, MD;  Location: [facility]               ENDOSCOPY;  Service: Endoscopy;  Laterality: N/A; No date: EYE SURGERY No date: HERNIA REPAIR     Comment:  umbilical hernia repair  No date: LAMINECTOMY FOR EXCISION / EVACUATION INTRASPINAL LESION     Comment:  lumbar No date: OTHER SURGICAL HISTORY     Comment:  right rotator cuff surgery  No date: OTHER SURGICAL HISTORY     Comment:  bilateral tubes in ears  10/27/2020: POSTERIOR CERVICAL LAMINECTOMY; N/A     Comment:  Procedure: POSTERIOR CERVICAL LAMINECTOMY CERVICAL               THREE-CERVICAL SIX;  Surgeon: Jadene Pierini, MD;                Location: [facility] OR;  Service: Neurosurgery;  Laterality:               N/A; No date: POSTERIOR LAMINECTOMY / DECOMPRESSION CERVICAL SPINE No date: PROSTATE BIOPSY     Comment:  10/22/2011 Volume:55.8 cc's, PSA:4.5, Free PSA:12% 12/15/2011: ROBOT ASSISTED LAPAROSCOPIC RADICAL PROSTATECTOMY     Comment:  Procedure: ROBOTIC ASSISTED LAPAROSCOPIC RADICAL               PROSTATECTOMY LEVEL 2;  Surgeon: Crecencio Mc, MD;                Location: [facility] ORS;  Service: Urology;  Laterality: N/A;                   No date: ROTATOR CUFF REPAIR; Right 11/07/2021: SHOULDER ARTHROSCOPY WITH SUBACROMIAL DECOMPRESSION,  ROTATOR CUFF REPAIR AND BICEP TENDON REPAIR; Left     Comment:  Procedure: SHOULDER ARTHROSCOPY WITH DEBRIDEMENT,               DECOMPRESSION, ROTATOR CUFF REPAIR AND BICEPS TENODESIS.;              Surgeon: Christena Flake, MD;  Location: [facility] ORS;                Service: Orthopedics;  Laterality: Left; No date: TONSILLECTOMY No date: TOTAL THYROIDECTOMY     Reproductive/Obstetrics negative OB ROS  Anesthesia Physical Anesthesia Plan  ASA: 3  Anesthesia Plan: General   Post-op Pain Management: Minimal or no pain anticipated   Induction: Intravenous  PONV  Risk Score and Plan: 2 and Propofol infusion and TIVA  Airway Management Planned: Natural Airway and Nasal Cannula  Additional Equipment:   Intra-op Plan:   Post-operative Plan:   Informed Consent: I have reviewed the patients History and Physical, chart, labs and discussed the procedure including the risks, benefits and alternatives for the proposed anesthesia with the patient or authorized representative who has indicated his/her understanding and acceptance.     Dental Advisory Given  Plan Discussed with: Anesthesiologist, CRNA and Surgeon  Anesthesia Plan Comments: (Patient consented for risks of anesthesia including but not limited to:  - adverse reactions to medications - risk of airway placement if required - damage to eyes, teeth, lips or other oral mucosa - nerve damage due to positioning  - sore throat or hoarseness - Damage to heart, brain, nerves, lungs, other parts of body or loss of life  Patient voiced understanding.)        Anesthesia Quick Evaluation

## 2023-07-02 NOTE — Transfer of Care (Signed)
Immediate Anesthesia Transfer of Care Note  Patient: [name] [name]  Procedure(s) Performed: ESOPHAGOGASTRODUODENOSCOPY (EGD) WITH PROPOFOL  Patient Location: Endoscopy Unit  Anesthesia Type:General  Level of Consciousness: drowsy and patient cooperative  Airway & Oxygen Therapy: Patient Spontanous Breathing and Patient connected to face mask oxygen  Post-op Assessment: Report given to RN and Post -op Vital signs reviewed and stable  Post vital signs: Reviewed and stable  Last Vitals:  Vitals Value Taken Time  BP 107/71 07/02/23 1045  Temp 35.9 C 07/02/23 1045  Pulse 56 07/02/23 1046  Resp 15 07/02/23 1046  SpO2 100 % 07/02/23 1046  Vitals shown include unfiled device data.  Last Pain:  Vitals:   07/02/23 1045  TempSrc: Temporal  PainSc: Asleep         Complications: No notable events documented.

## 2023-07-02 NOTE — Anesthesia Procedure Notes (Signed)
Procedure Name: General with mask airway Date/Time: 07/02/2023 10:39 AM  Performed by: Mohammed Kindle, CRNAPre-anesthesia Checklist: Patient identified, Emergency Drugs available, Suction available and Patient being monitored Patient Re-evaluated:Patient Re-evaluated prior to induction Oxygen Delivery Method: Simple face mask Induction Type: IV induction Placement Confirmation: positive ETCO2, CO2 detector and breath sounds checked- equal and bilateral Dental Injury: Teeth and Oropharynx as per pre-operative assessment

## 2023-07-02 NOTE — Op Note (Signed)
[facility] Patient Name: [name] [name] Procedure Date: 07/02/2023 10:05 AM MRN: [id number] Account #: [id number] Date of Birth: 06-Jul-1946 Admit Type: Outpatient Age: 77 Room: [facility] ENDO ROOM 1 Gender: Male Note Status: Finalized Instrument Name: Upper Endoscope [id number] Procedure:             Upper GI endoscopy Indications:           Dysphagia Providers:             Jaynie Collins DO, DO Referring MD:          No Local Md, MD (Referring MD) Medicines:             Monitored Anesthesia Care Complications:         No immediate complications. Estimated blood loss:                         Minimal. Procedure:             Pre-Anesthesia Assessment:                        - Prior to the procedure, a History and Physical was                         performed, and patient medications and allergies were                         reviewed. The patient is competent. The risks and                         benefits of the procedure and the sedation options and                         risks were discussed with the patient. All questions                         were answered and informed consent was obtained.                         Patient identification and proposed procedure were                         verified by the physician, the nurse, the anesthetist                         and the technician in the endoscopy suite. Mental                         Status Examination: alert and oriented. Airway                         Examination: normal oropharyngeal airway and neck                         mobility. Respiratory Examination: clear to                         auscultation. CV Examination: RRR, no murmurs, no S3  or S4. Prophylactic Antibiotics: The patient does not                         require prophylactic antibiotics. Prior                         Anticoagulants: The patient has taken Plavix                         (clopidogrel),  last dose was 7 days prior to                         procedure. ASA Grade Assessment: III - A patient with                         severe systemic disease. After reviewing the risks and                         benefits, the patient was deemed in satisfactory                         condition to undergo the procedure. The anesthesia                         plan was to use monitored anesthesia care (MAC).                         Immediately prior to administration of medications,                         the patient was re-assessed for adequacy to receive                         sedatives. The heart rate, respiratory rate, oxygen                         saturations, blood pressure, adequacy of pulmonary                         ventilation, and response to care were monitored                         throughout the procedure. The physical status of the                         patient was re-assessed after the procedure.                        After obtaining informed consent, the endoscope was                         passed under direct vision. Throughout the procedure,                         the patient's blood pressure, pulse, and oxygen                         saturations were monitored continuously. The Endoscope  was introduced through the mouth, and advanced to the                         second part of duodenum. The upper GI endoscopy was                         accomplished without difficulty. The patient tolerated                         the procedure well. Findings:      The duodenal bulb, first portion of the duodenum and second portion of       the duodenum were normal. Biopsies for histology were taken with a cold       forceps for evaluation of celiac disease. Estimated blood loss was       minimal.      The entire examined stomach was normal. Estimated blood loss: none.      The Z-line was regular. Estimated blood loss: none.      Esophagogastric  landmarks were identified: the gastroesophageal junction       was found at 45 cm from the incisors.      Abnormal motility was noted in the esophagus. The cricopharyngeus was       normal. There is spasticity of the esophageal body. The distal       esophagus/lower esophageal sphincter is open. Tertiary peristaltic waves       are noted. A TTS dilator was passed through the scope. Dilation with a       15-16.5-18 mm balloon dilator was performed to 16.5 mm and 18 mm. The       dilation site was examined following endoscope reinsertion and showed no       change. Estimated blood loss: none.      The examined esophagus was moderately tortuous. Estimated blood loss:       none. Impression:            - Normal duodenal bulb, first portion of the duodenum                         and second portion of the duodenum. Biopsied.                        - Normal stomach.                        - Z-line regular.                        - Esophagogastric landmarks identified.                        - Abnormal esophageal motility, suspicious for                         presbyesophagus. Dilated.                        - Tortuous esophagus. Recommendation:        - Patient has a contact number available for                         emergencies. The signs and  symptoms of potential                         delayed complications were discussed with the patient.                         Return to normal activities tomorrow. Written                         discharge instructions were provided to the patient.                        - Discharge patient to home.                        - Resume previous diet.                        - Continue present medications.                        - Await pathology results.                        - Repeat upper endoscopy PRN for retreatment.                        - Return to GI office as previously scheduled.                        - The findings and recommendations were  discussed with                         the patient. Procedure Code(s):     --- Professional ---                        [phone], Esophagogastroduodenoscopy, flexible,                         transoral; with transendoscopic balloon dilation of                         esophagus (less than 30 mm diameter)                        43239, 59, Esophagogastroduodenoscopy, flexible,                         transoral; with biopsy, single or multiple Diagnosis Code(s):     --- Professional ---                        K22.4, Dyskinesia of esophagus                        Q39.9, Congenital malformation of esophagus,                         unspecified                        R13.10, Dysphagia, unspecified CPT copyright 2022 American Medical Association. All rights reserved. The  codes documented in this report are preliminary and upon coder review may  be revised to meet current compliance requirements. Attending Participation:      I personally performed the entire procedure. Elfredia Nevins, DO Jaynie Collins DO, DO 07/02/2023 10:49:23 AM This report has been signed electronically. Number of Addenda: 0 Note Initiated On: 07/02/2023 10:05 AM Estimated Blood Loss:  Estimated blood loss was minimal.      [facility]

## 2023-07-02 NOTE — Interval H&P Note (Signed)
History and Physical Interval Note: Preprocedure H&P from 07/02/23  was reviewed and there was no interval change after seeing and examining the patient.  Written consent was obtained from the patient after discussion of risks, benefits, and alternatives. Patient has consented to proceed with Esophagogastroduodenoscopy with possible intervention   07/02/2023 10:20 AM  [name] [name]  has presented today for surgery, with the diagnosis of 530.81 (ICD-9-CM) - K21.9 (ICD-10-CM) - Gastroesophageal reflux disease, unspecified whether esophagitis present787.20 (ICD-9-CM) - R13.10 (ICD-10-CM) - Dysphagia, unspecified type787.99 (ICD-9-CM) - R19.4 (ICD-10-CM) - Change in bowel habits.  The various methods of treatment have been discussed with the patient and family. After consideration of risks, benefits and other options for treatment, the patient has consented to  Procedure(s): ESOPHAGOGASTRODUODENOSCOPY (EGD) WITH PROPOFOL (N/A) as a surgical intervention.  The patient's history has been reviewed, patient examined, no change in status, stable for surgery.  I have reviewed the patient's chart and labs.  Questions were answered to the patient's satisfaction.     Jaynie Collins

## 2023-07-03 ENCOUNTER — Encounter: Payer: Self-pay | Admitting: Gastroenterology

## 2023-11-15 ENCOUNTER — Encounter: Payer: Self-pay | Admitting: Emergency Medicine

## 2023-11-15 ENCOUNTER — Emergency Department: Payer: Medicare Other

## 2023-11-15 ENCOUNTER — Inpatient Hospital Stay: Payer: Medicare Other

## 2023-11-15 ENCOUNTER — Inpatient Hospital Stay
Admission: EM | Admit: 2023-11-15 | Discharge: 2023-11-19 | DRG: 177 | Disposition: A | Discharge status: Skilled Nursing Facility | Payer: Medicare Other | Source: Skilled Nursing Facility | Attending: Internal Medicine | Admitting: Internal Medicine

## 2023-11-15 ENCOUNTER — Other Ambulatory Visit: Payer: Self-pay

## 2023-11-15 DIAGNOSIS — E890 Postprocedural hypothyroidism: Secondary | ICD-10-CM | POA: Diagnosis present

## 2023-11-15 DIAGNOSIS — Z720 Tobacco use: Secondary | ICD-10-CM | POA: Diagnosis present

## 2023-11-15 DIAGNOSIS — R4182 Altered mental status, unspecified: Secondary | ICD-10-CM | POA: Diagnosis present

## 2023-11-15 DIAGNOSIS — G459 Transient cerebral ischemic attack, unspecified: Secondary | ICD-10-CM | POA: Diagnosis present

## 2023-11-15 DIAGNOSIS — E039 Hypothyroidism, unspecified: Secondary | ICD-10-CM | POA: Diagnosis not present

## 2023-11-15 DIAGNOSIS — R1312 Dysphagia, oropharyngeal phase: Secondary | ICD-10-CM | POA: Diagnosis present

## 2023-11-15 DIAGNOSIS — I110 Hypertensive heart disease with heart failure: Secondary | ICD-10-CM | POA: Diagnosis present

## 2023-11-15 DIAGNOSIS — J690 Pneumonitis due to inhalation of food and vomit: Secondary | ICD-10-CM | POA: Diagnosis present

## 2023-11-15 DIAGNOSIS — E538 Deficiency of other specified B group vitamins: Secondary | ICD-10-CM | POA: Diagnosis not present

## 2023-11-15 DIAGNOSIS — J9602 Acute respiratory failure with hypercapnia: Secondary | ICD-10-CM | POA: Diagnosis present

## 2023-11-15 DIAGNOSIS — Z66 Do not resuscitate: Secondary | ICD-10-CM | POA: Diagnosis present

## 2023-11-15 DIAGNOSIS — Z7902 Long term (current) use of antithrombotics/antiplatelets: Secondary | ICD-10-CM

## 2023-11-15 DIAGNOSIS — E441 Mild protein-calorie malnutrition: Secondary | ICD-10-CM | POA: Diagnosis present

## 2023-11-15 DIAGNOSIS — Z8673 Personal history of transient ischemic attack (TIA), and cerebral infarction without residual deficits: Secondary | ICD-10-CM

## 2023-11-15 DIAGNOSIS — Z860100 Personal history of colon polyps, unspecified: Secondary | ICD-10-CM

## 2023-11-15 DIAGNOSIS — J449 Chronic obstructive pulmonary disease, unspecified: Secondary | ICD-10-CM | POA: Diagnosis present

## 2023-11-15 DIAGNOSIS — Z79899 Other long term (current) drug therapy: Secondary | ICD-10-CM

## 2023-11-15 DIAGNOSIS — R1314 Dysphagia, pharyngoesophageal phase: Secondary | ICD-10-CM | POA: Diagnosis present

## 2023-11-15 DIAGNOSIS — F329 Major depressive disorder, single episode, unspecified: Secondary | ICD-10-CM | POA: Diagnosis present

## 2023-11-15 DIAGNOSIS — G4733 Obstructive sleep apnea (adult) (pediatric): Secondary | ICD-10-CM | POA: Diagnosis present

## 2023-11-15 DIAGNOSIS — N179 Acute kidney failure, unspecified: Secondary | ICD-10-CM | POA: Diagnosis present

## 2023-11-15 DIAGNOSIS — Z8546 Personal history of malignant neoplasm of prostate: Secondary | ICD-10-CM

## 2023-11-15 DIAGNOSIS — E782 Mixed hyperlipidemia: Secondary | ICD-10-CM | POA: Diagnosis present

## 2023-11-15 DIAGNOSIS — Z7984 Long term (current) use of oral hypoglycemic drugs: Secondary | ICD-10-CM

## 2023-11-15 DIAGNOSIS — Z8249 Family history of ischemic heart disease and other diseases of the circulatory system: Secondary | ICD-10-CM

## 2023-11-15 DIAGNOSIS — I509 Heart failure, unspecified: Secondary | ICD-10-CM | POA: Diagnosis present

## 2023-11-15 DIAGNOSIS — Z6837 Body mass index (BMI) 37.0-37.9, adult: Secondary | ICD-10-CM

## 2023-11-15 DIAGNOSIS — K219 Gastro-esophageal reflux disease without esophagitis: Secondary | ICD-10-CM | POA: Diagnosis present

## 2023-11-15 DIAGNOSIS — Z8701 Personal history of pneumonia (recurrent): Secondary | ICD-10-CM

## 2023-11-15 DIAGNOSIS — E46 Unspecified protein-calorie malnutrition: Secondary | ICD-10-CM | POA: Diagnosis present

## 2023-11-15 DIAGNOSIS — Z825 Family history of asthma and other chronic lower respiratory diseases: Secondary | ICD-10-CM

## 2023-11-15 DIAGNOSIS — F419 Anxiety disorder, unspecified: Secondary | ICD-10-CM | POA: Diagnosis present

## 2023-11-15 DIAGNOSIS — Z87891 Personal history of nicotine dependence: Secondary | ICD-10-CM

## 2023-11-15 DIAGNOSIS — T17908A Unspecified foreign body in respiratory tract, part unspecified causing other injury, initial encounter: Secondary | ICD-10-CM

## 2023-11-15 DIAGNOSIS — R1319 Other dysphagia: Secondary | ICD-10-CM | POA: Diagnosis not present

## 2023-11-15 DIAGNOSIS — G9341 Metabolic encephalopathy: Secondary | ICD-10-CM | POA: Diagnosis present

## 2023-11-15 DIAGNOSIS — F03918 Unspecified dementia, unspecified severity, with other behavioral disturbance: Secondary | ICD-10-CM | POA: Diagnosis present

## 2023-11-15 DIAGNOSIS — Z794 Long term (current) use of insulin: Secondary | ICD-10-CM

## 2023-11-15 DIAGNOSIS — F0393 Unspecified dementia, unspecified severity, with mood disturbance: Secondary | ICD-10-CM | POA: Diagnosis present

## 2023-11-15 DIAGNOSIS — J9601 Acute respiratory failure with hypoxia: Secondary | ICD-10-CM | POA: Diagnosis present

## 2023-11-15 DIAGNOSIS — Z7951 Long term (current) use of inhaled steroids: Secondary | ICD-10-CM

## 2023-11-15 DIAGNOSIS — R1310 Dysphagia, unspecified: Secondary | ICD-10-CM

## 2023-11-15 DIAGNOSIS — G4700 Insomnia, unspecified: Secondary | ICD-10-CM | POA: Diagnosis present

## 2023-11-15 DIAGNOSIS — E1165 Type 2 diabetes mellitus with hyperglycemia: Secondary | ICD-10-CM | POA: Diagnosis present

## 2023-11-15 DIAGNOSIS — E1140 Type 2 diabetes mellitus with diabetic neuropathy, unspecified: Secondary | ICD-10-CM | POA: Diagnosis present

## 2023-11-15 DIAGNOSIS — Z823 Family history of stroke: Secondary | ICD-10-CM

## 2023-11-15 DIAGNOSIS — I10 Essential (primary) hypertension: Secondary | ICD-10-CM | POA: Diagnosis present

## 2023-11-15 DIAGNOSIS — Z818 Family history of other mental and behavioral disorders: Secondary | ICD-10-CM

## 2023-11-15 DIAGNOSIS — Z79890 Hormone replacement therapy (postmenopausal): Secondary | ICD-10-CM

## 2023-11-15 LAB — CBC WITH DIFFERENTIAL/PLATELET
Abs Immature Granulocytes: 0.1 10*3/uL — ABNORMAL HIGH (ref 0.00–0.07)
Basophils Absolute: 0.1 10*3/uL (ref 0.0–0.1)
Basophils Relative: 0 %
Eosinophils Absolute: 0 10*3/uL (ref 0.0–0.5)
Eosinophils Relative: 0 %
HCT: 43.7 % (ref 39.0–52.0)
Hemoglobin: 13.7 g/dL (ref 13.0–17.0)
Immature Granulocytes: 1 %
Lymphocytes Relative: 4 %
Lymphs Abs: 0.7 10*3/uL (ref 0.7–4.0)
MCH: 28.8 pg (ref 26.0–34.0)
MCHC: 31.4 g/dL (ref 30.0–36.0)
MCV: 91.8 fL (ref 80.0–100.0)
Monocytes Absolute: 1.3 10*3/uL — ABNORMAL HIGH (ref 0.1–1.0)
Monocytes Relative: 7 %
Neutro Abs: 16.4 10*3/uL — ABNORMAL HIGH (ref 1.7–7.7)
Neutrophils Relative %: 88 %
Platelets: 213 10*3/uL (ref 150–400)
RBC: 4.76 MIL/uL (ref 4.22–5.81)
RDW: 17.3 % — ABNORMAL HIGH (ref 11.5–15.5)
WBC: 18.5 10*3/uL — ABNORMAL HIGH (ref 4.0–10.5)
nRBC: 0 % (ref 0.0–0.2)

## 2023-11-15 LAB — COMPREHENSIVE METABOLIC PANEL
ALT: 14 U/L (ref 0–44)
AST: 17 U/L (ref 15–41)
Albumin: 3.2 g/dL — ABNORMAL LOW (ref 3.5–5.0)
Alkaline Phosphatase: 121 U/L (ref 38–126)
Anion gap: 12 (ref 5–15)
BUN: 26 mg/dL — ABNORMAL HIGH (ref 8–23)
CO2: 29 mmol/L (ref 22–32)
Calcium: 8.8 mg/dL — ABNORMAL LOW (ref 8.9–10.3)
Chloride: 101 mmol/L (ref 98–111)
Creatinine, Ser: 1.67 mg/dL — ABNORMAL HIGH (ref 0.61–1.24)
GFR, Estimated: 41 mL/min — ABNORMAL LOW (ref >60)
Glucose, Bld: 110 mg/dL — ABNORMAL HIGH (ref 70–99)
Potassium: 4.9 mmol/L (ref 3.5–5.1)
Sodium: 142 mmol/L (ref 135–145)
Total Bilirubin: 0.8 mg/dL (ref <1.2)
Total Protein: 7.3 g/dL (ref 6.5–8.1)

## 2023-11-15 LAB — URINALYSIS, ROUTINE W REFLEX MICROSCOPIC
Bilirubin Urine: NEGATIVE
Glucose, UA: NEGATIVE mg/dL
Hgb urine dipstick: NEGATIVE
Ketones, ur: NEGATIVE mg/dL
Leukocytes,Ua: NEGATIVE
Nitrite: NEGATIVE
Protein, ur: NEGATIVE mg/dL
Specific Gravity, Urine: 1.01 (ref 1.005–1.030)
pH: 5 (ref 5.0–8.0)

## 2023-11-15 LAB — BLOOD GAS, VENOUS
Acid-Base Excess: 6 mmol/L — ABNORMAL HIGH (ref 0.0–2.0)
Acid-Base Excess: 4.8 mmol/L — ABNORMAL HIGH (ref 0.0–2.0)
Bicarbonate: 34.7 mmol/L — ABNORMAL HIGH (ref 20.0–28.0)
Bicarbonate: 33.2 mmol/L — ABNORMAL HIGH (ref 20.0–28.0)
O2 Saturation: 79.3 %
O2 Saturation: 57.2 %
Patient temperature: 37
Patient temperature: 37
pCO2, Ven: 69 mm[Hg] — ABNORMAL HIGH (ref 44–60)
pCO2, Ven: 66 mm[Hg] — ABNORMAL HIGH (ref 44–60)
pH, Ven: 7.31 (ref 7.25–7.43)
pH, Ven: 7.31 (ref 7.25–7.43)
pO2, Ven: 52 mm[Hg] — ABNORMAL HIGH (ref 32–45)
pO2, Ven: 34 mm[Hg] (ref 32–45)

## 2023-11-15 LAB — BRAIN NATRIURETIC PEPTIDE: B Natriuretic Peptide: 91.6 pg/mL (ref 0.0–100.0)

## 2023-11-15 LAB — LACTIC ACID, PLASMA: Lactic Acid, Venous: 0.8 mmol/L (ref 0.5–1.9)

## 2023-11-15 LAB — PROCALCITONIN: Procalcitonin: 0.13 ng/mL

## 2023-11-15 LAB — TROPONIN I (HIGH SENSITIVITY)
Troponin I (High Sensitivity): 13 ng/L (ref <18)
Troponin I (High Sensitivity): 12 ng/L (ref <18)

## 2023-11-15 LAB — CBG MONITORING, ED
Glucose-Capillary: 87 mg/dL (ref 70–99)
Glucose-Capillary: 85 mg/dL (ref 70–99)

## 2023-11-15 MED ORDER — LEVOTHYROXINE SODIUM 50 MCG PO TABS
150.0000 ug | ORAL_TABLET | Freq: Every day | ORAL | Status: DC
  Administered 2023-11-16 – 2023-11-17 (×2): 150 ug via ORAL
  Filled 2023-11-15 (×2): qty 1
  Filled 2023-11-15: qty 3

## 2023-11-15 MED ORDER — ACETAMINOPHEN 650 MG RE SUPP: 650.0000 mg | Freq: Four times a day (QID) | RECTAL | Status: DC | PRN

## 2023-11-15 MED ORDER — ONDANSETRON HCL 4 MG/2ML IJ SOLN: 4.0000 mg | Freq: Four times a day (QID) | INTRAMUSCULAR | Status: DC | PRN

## 2023-11-15 MED ORDER — INSULIN ASPART 100 UNIT/ML IJ SOLN: 0.0000 [IU] | Freq: Every day | INTRAMUSCULAR | Status: DC

## 2023-11-15 MED ORDER — CYANOCOBALAMIN 1000 MCG/ML IJ SOLN
1000.0000 ug | Freq: Once | INTRAMUSCULAR | Status: AC
  Administered 2023-11-15: 1000 ug via INTRAMUSCULAR
  Filled 2023-11-15: qty 1

## 2023-11-15 MED ORDER — INSULIN ASPART 100 UNIT/ML IJ SOLN
0.0000 [IU] | Freq: Three times a day (TID) | INTRAMUSCULAR | Status: DC
  Administered 2023-11-16 – 2023-11-17 (×2): 2 [IU] via SUBCUTANEOUS
  Administered 2023-11-18: 1 [IU] via SUBCUTANEOUS
  Filled 2023-11-15 (×3): qty 1

## 2023-11-15 MED ORDER — IOHEXOL 300 MG/ML  SOLN
80.0000 mL | Freq: Once | INTRAMUSCULAR | Status: AC | PRN
  Administered 2023-11-15: 80 mL via INTRAVENOUS

## 2023-11-15 MED ORDER — SODIUM CHLORIDE 0.9 % IV SOLN
3.0000 g | Freq: Four times a day (QID) | INTRAVENOUS | Status: DC
  Administered 2023-11-15 – 2023-11-19 (×15): 3 g via INTRAVENOUS
  Filled 2023-11-15 (×17): qty 8

## 2023-11-15 MED ORDER — SODIUM CHLORIDE 0.9 % IV SOLN
3.0000 g | Freq: Once | INTRAVENOUS | Status: AC
  Administered 2023-11-15: 3 g via INTRAVENOUS
  Filled 2023-11-15: qty 8

## 2023-11-15 MED ORDER — LACTATED RINGERS IV SOLN
150.0000 mL/h | INTRAVENOUS | Status: AC
  Administered 2023-11-15 – 2023-11-16 (×2): 150 mL/h via INTRAVENOUS

## 2023-11-15 MED ORDER — HEPARIN SODIUM (PORCINE) 5000 UNIT/ML IJ SOLN
5000.0000 [IU] | Freq: Three times a day (TID) | INTRAMUSCULAR | Status: DC
  Administered 2023-11-15 – 2023-11-19 (×11): 5000 [IU] via SUBCUTANEOUS
  Filled 2023-11-15 (×11): qty 1

## 2023-11-15 MED ORDER — ONDANSETRON HCL 4 MG PO TABS: 4.0000 mg | ORAL_TABLET | Freq: Four times a day (QID) | ORAL | Status: DC | PRN

## 2023-11-15 MED ORDER — ACETAMINOPHEN 325 MG PO TABS
650.0000 mg | ORAL_TABLET | Freq: Four times a day (QID) | ORAL | Status: DC | PRN
  Administered 2023-11-16 – 2023-11-17 (×3): 650 mg via ORAL
  Filled 2023-11-15 (×3): qty 2

## 2023-11-15 NOTE — ED Provider Notes (Signed)
[facility] Provider Note    Event Date/Time   First MD Initiated Contact with Patient 11/15/23 1239     (approximate)   History   Chief Complaint Shortness of Breath   HPI  [name] [name] is a 77 y.o. male with past medical history of hypertension, diabetes, COPD, CHF, stroke, and dementia who presents to the ED for shortness of breath.  Per EMS, patient became acutely short of breath and confused earlier today at his nursing facility.  EMS was called and found patient somnolent with difficulty breathing, initial oxygen saturations in the mid 80s on room air, he was placed on nonrebreather with improvement.  Patient somnolent but arousable to voice here in the ED, complains of pain to both sides of his lower abdomen but denies any pain in his chest.  He endorses cough and difficulty breathing today.  Staff in the ED also happened to be working with the patient yesterday at his nursing facility, noted that he had aspirated while eating a hot dog yesterday at his facility.     Physical Exam   Triage Vital Signs: ED Triage Vitals  Encounter Vitals Group     BP 11/15/23 1235 125/67     Systolic BP Percentile --      Diastolic BP Percentile --      Pulse Rate 11/15/23 1235 76     Resp 11/15/23 1235 20     Temp 11/15/23 1235 99.5 F (37.5 C)     Temp Source 11/15/23 1235 Oral     SpO2 11/15/23 1235 98 %     Weight --      Height --      Head Circumference --      Peak Flow --      Pain Score 11/15/23 1236 7     Pain Loc --      Pain Education --      Exclude from Growth Chart --     Most recent vital signs: Vitals:   11/15/23 1430 11/15/23 1500  BP: 109/60 131/71  Pulse: 65 70  Resp: 16 14  Temp:    SpO2: 100% 99%    Constitutional: Somnolent but arousable to voice. Eyes: Conjunctivae are normal. Head: Atraumatic. Nose: No congestion/rhinnorhea. Mouth/Throat: Mucous membranes are moist.  Cardiovascular: Normal rate, regular rhythm.  Grossly normal heart sounds.  2+ radial pulses bilaterally. Respiratory: Tachypneic with increased respiratory effort and accessory muscle use, coarse crackles throughout. Gastrointestinal: Soft and nontender. No distention. Musculoskeletal: No lower extremity tenderness nor edema.  Neurologic: No gross focal neurologic deficits are appreciated.    ED Results / Procedures / Treatments   Labs (all labs ordered are listed, but only abnormal results are displayed) Labs Reviewed  CBC WITH DIFFERENTIAL/PLATELET - Abnormal; Notable for the following components:      Result Value   WBC 18.5 (*)    RDW 17.3 (*)    Neutro Abs 16.4 (*)    Monocytes Absolute 1.3 (*)    Abs Immature Granulocytes 0.10 (*)    All other components within normal limits  COMPREHENSIVE METABOLIC PANEL - Abnormal; Notable for the following components:   Glucose, Bld 110 (*)    BUN 26 (*)    Creatinine, Ser 1.67 (*)    Calcium 8.8 (*)    Albumin 3.2 (*)    GFR, Estimated 41 (*)    All other components within normal limits  URINALYSIS, ROUTINE W REFLEX MICROSCOPIC - Abnormal; Notable for the  following components:   Color, Urine YELLOW (*)    APPearance CLEAR (*)    All other components within normal limits  BLOOD GAS, VENOUS - Abnormal; Notable for the following components:   pCO2, Ven 69 (*)    pO2, Ven 52 (*)    Bicarbonate 34.7 (*)    Acid-Base Excess 6.0 (*)    All other components within normal limits  CULTURE, BLOOD (ROUTINE X 2)  CULTURE, BLOOD (ROUTINE X 2)  BRAIN NATRIURETIC PEPTIDE  PROCALCITONIN  LACTIC ACID, PLASMA  BLOOD GAS, VENOUS  TROPONIN I (HIGH SENSITIVITY)  TROPONIN I (HIGH SENSITIVITY)     EKG  ED ECG REPORT I, Chesley Noon, the attending physician, personally viewed and interpreted this ECG.   Date: 11/15/2023  EKG Time: 12:42  Rate: 75  Rhythm: normal sinus rhythm  Axis: LAD  Intervals:right bundle branch block  ST&T Change: None  RADIOLOGY Chest x-ray reviewed and  interpreted by me with right sided infiltrate concerning for aspiration, no effusion noted.  PROCEDURES:  Critical Care performed: Yes, see critical care procedure note(s)  .Critical Care  Performed by: Chesley Noon, MD Authorized by: Chesley Noon, MD   Critical care provider statement:    Critical care time (minutes):  30   Critical care time was exclusive of:  Separately billable procedures and treating other patients and teaching time   Critical care was necessary to treat or prevent imminent or life-threatening deterioration of the following conditions:  Respiratory failure   Critical care was time spent personally by me on the following activities:  Development of treatment plan with patient or surrogate, discussions with consultants, evaluation of patient's response to treatment, examination of patient, ordering and review of laboratory studies, ordering and review of radiographic studies, ordering and performing treatments and interventions, pulse oximetry, re-evaluation of patient's condition and review of old charts   I assumed direction of critical care for this patient from another provider in my specialty: no     Care discussed with: admitting provider      MEDICATIONS ORDERED IN ED: Medications  Ampicillin-Sulbactam (UNASYN) 3 g in sodium chloride 0.9 % 100 mL IVPB (0 g Intravenous Stopped 11/15/23 1411)  iohexol (OMNIPAQUE) 300 MG/ML solution 80 mL (80 mLs Intravenous Contrast Given 11/15/23 1347)     IMPRESSION / MDM / ASSESSMENT AND PLAN / ED COURSE  I reviewed the triage vital signs and the nursing notes.                              77 y.o. male with past medical history of hypertension, diabetes, COPD, CHF, stroke, and dementia who presents to the ED with increasing difficulty breathing and somnolence after reportedly aspirating on a hot dog yesterday.  Patient's presentation is most consistent with acute presentation with potential threat to life or bodily  function.  Differential diagnosis includes, but is not limited to, aspiration pneumonia, sepsis, CHF, COPD, ACS, PE, pneumothorax, anemia, electrolyte abnormality, AKI.  Patient ill-appearing and in moderate respiratory distress on arrival, tachypneic with room air oxygen saturations of 87%.  Oxygen saturation improved on nonrebreather, will trial patient on high flow nasal cannula to help with work of breathing and oxygenation, would also consider transition to BiPAP if he is not improving however would be cautious with this given risk for recurrent aspiration.  EKG shows no evidence of arrhythmia or ischemia, labs and chest x-ray are pending at this time.  Labs markable  for leukocytosis with no significant anemia or electrolyte abnormality, mild AKI noted.  Troponin and BNP within normal limits, lactic acid also within normal limits.  Presentation concerning for sepsis, patient started on IV Unasyn due to aspiration.  VBG shows mild hypercapnic respiratory failure, patient transition to heated high flow nasal cannula given risk for recurrent aspiration with BiPAP.  We will repeat VBG and if there is worsening, we would likely have no choice but to transition patient to BiPAP.  I did speak with his medical power of attorney over the phone, who confirmed DNR/DNI status.  Case discussed with hospitalist for admission.      FINAL CLINICAL IMPRESSION(S) / ED DIAGNOSES   Final diagnoses:  Acute respiratory failure with hypoxia and hypercapnia (HCC)  Aspiration into airway, initial encounter     Rx / DC Orders   ED Discharge Orders     None        Note:  This document was prepared using Dragon voice recognition software and may include unintentional dictation errors.   Chesley Noon, MD 11/15/23 [phone]

## 2023-11-15 NOTE — ED Notes (Signed)
Patient c/o abdominal pain. Unable to give a number rating. MD made aware

## 2023-11-15 NOTE — ED Triage Notes (Signed)
Patient presents to ED via [facility] from [facility] for c/o SOB. Pt placed on 2L for 80% by [facility] and then went unresponsive and EMS was called.   Patient placed on 15L NRB for EMS.   20G to L AC from EMS.   Aspirated on hot dog yesterday per NT that works at [facility] as well.

## 2023-11-15 NOTE — Assessment & Plan Note (Signed)
Home levothyroxine 150 mcg daily resumed on admission

## 2023-11-15 NOTE — Progress Notes (Signed)
Pharmacy Antibiotic Note  [name] [name] is a 77 y.o. male from [facility] admitted on 11/15/2023 with aspiration pneumonia following aspiration event 12/28 while eating.  Pharmacy has been consulted for Unasyn dosing.  Plan: Ampicillin sulbactam 3g IV q6h Monitor clinical picture, renal function F/U C&S, abx de-escalation, LOT   Height: 6\' 3"  (190.5 cm) Weight: 136.1 kg (300 lb) IBW/kg (Calculated) : 84.5  Temp (24hrs), Avg:99.5 F (37.5 C), Min:99.5 F (37.5 C), Max:99.5 F (37.5 C)  Recent Labs  Lab 11/15/23 1245 11/15/23 1246  WBC 18.5*  --   CREATININE 1.67*  --   LATICACIDVEN  --  0.8    Estimated Creatinine Clearance: 53.3 mL/min (A) (by C-G formula based on SCr of 1.67 mg/dL (H)).    No Known Allergies  Antimicrobials this admission: Unasyn 12/29>  Dose adjustments this admission: N/a  Microbiology results: 12/29 BCx pending  Thank you for allowing pharmacy to be a part of this patients care.  Doroteo Glassman 11/15/2023 3:46 PM

## 2023-11-15 NOTE — ED Notes (Signed)
Staff from [facility] calling to request update on patient status. Informed that patient will be admitted d/t acute respiratory failure and aspiration.

## 2023-11-15 NOTE — ED Notes (Signed)
Patients sister at bedside at this time.

## 2023-11-15 NOTE — ED Notes (Signed)
Dr. Larinda Buttery on phone with family at this time.

## 2023-11-15 NOTE — Assessment & Plan Note (Signed)
CT head wo contrast ordered on admission

## 2023-11-15 NOTE — Assessment & Plan Note (Signed)
LR infusion at 150 mL/h, 1 day ordered on admission Recheck BMP in a.m.

## 2023-11-15 NOTE — Sepsis Progress Note (Signed)
Sepsis protocol monitored by eLink ?

## 2023-11-15 NOTE — Assessment & Plan Note (Signed)
This meets criteria for morbid obesity based on the presence of 1 or more chronic comorbidities. 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 This complicates overall care and prognosis.

## 2023-11-15 NOTE — Hospital Course (Addendum)
[name] is a 77 year old male with history of hypothyroid, hyperlipidemia, depression, anxiety, GERD, neuropathy, insulin-dependent diabetes mellitus, who presents to the emergency department for chief concerns of shortness of breath from [facility].  Per [facility] staff, patient was seen aspirating on a hot dog last evening, 11/14/2023.  Patient was reported to have declined since then.  Vitals in the ED showed temperature of 99, respiration rate of 19, heart rate 76, blood pressure 134/65, SpO2 of 95% on 80% 15 L nasal cannula.  Serum sodium is 142, potassium 4.9, chloride 101, bicarb 29, BUN of 26, serum creatinine 1.67, EGFR 41, nonfasting blood glucose 110, WBC 18.5, hemoglobin 13.7, platelets of 213.  BNP is 91.6.  HS troponin is 13.  Lactic acid 0.8.  Procalcitonin 0.13.  UA was negative for leukocytes and nitrates.  VBG: 7.31/69/52  Blood cultures x 2 are in process.  ED treatment: Unasyn 3 g IV.

## 2023-11-15 NOTE — Assessment & Plan Note (Addendum)
Unasyn per pharmacy Blood cultures x 2 are in process Recheck procalcitonin in the a.m. Aspiration and fall precaution Check CBC in the a.m. Will avoid BiPAP at this time given history of recurrent aspiration and high risk for aspiration

## 2023-11-15 NOTE — Assessment & Plan Note (Signed)
Per B12 level on 04/21/2023 at 176 I do not see B12 medication given or home B12 supplementation Vitamin B12 1000 mcg IM one-time dose ordered on admission Recommend outpatient follow-up and treatment as appropriate

## 2023-11-15 NOTE — Assessment & Plan Note (Signed)
Insulin-dependent diabetes mellitus type 2 Hemoglobin A1c from 05/07/2023: Was 7.8% Check A1c on admission Oral antiglycemic medications will not be resumed on admission Insulin SSI with at bedtime coverage ordered Goal inpatient blood glucose levels 140-180

## 2023-11-15 NOTE — Progress Notes (Signed)
CODE SEPSIS - PHARMACY COMMUNICATION  **Broad Spectrum Antibiotics should be administered within 1 hour of Sepsis diagnosis**  Time Code Sepsis Called/Page Received: 1311  Antibiotics Ordered: Unasyn  Time of 1st antibiotic administration: 1341  Additional action taken by pharmacy: Messaged nurse  If necessary, Name of Provider/Nurse Contacted: Milana Na, RN    Merryl Hacker ,PharmD Clinical Pharmacist  11/15/2023  1:11 PM

## 2023-11-15 NOTE — H&P (Addendum)
History and Physical   GARRIE COCCA VWU:[id number] DOB: June 03, 1946 DOA: 11/15/2023  PCP: Sherol Dade, DO  Outpatient Specialists: [facility] Patient coming from: [facility] via EMS  I have personally briefly reviewed patient's old medical records in [facility] EMR.  Chief Concern: Shortness of breath  HPI: [name] is a 77 year old male with history of hypothyroid, hyperlipidemia, depression, anxiety, GERD, neuropathy, insulin-dependent diabetes mellitus, who presents to the emergency department for chief concerns of shortness of breath from [facility]  Per [facility] staff, patient was seen aspirating on a hot dog last evening, 11/14/2023.  Patient was reported to have declined since then.  Vitals in the ED showed temperature of 99, respiration rate of 19, heart rate 76, blood pressure 134/65, SpO2 of 95% on 80% 15 L nasal cannula.  Serum sodium is 142, potassium 4.9, chloride 101, bicarb 29, BUN of 26, serum creatinine 1.67, EGFR 41, nonfasting blood glucose 110, WBC 18.5, hemoglobin 13.7, platelets of 213.  BNP is 91.6.  HS troponin is 13.  Lactic acid 0.8.  Procalcitonin 0.13.  UA was negative for leukocytes and nitrates.  VBG: 7.31/69/52  Blood cultures x 2 are in process.  ED treatment: Unasyn 3 g IV. --------------------------- At bedside, patient is opening his eyes and mumbles incoherently.  Patient was not able to tell me his name, age, location, current calendar year.  Patient not able to follow commands.  Social history: Patient is from [facility].  ROS: unable to complete due to acuity of patient presentation  ED Course: Discussed with the EDP, patient requiring hospitalization for chief concerns of aspiration pneumonia.  Assessment/Plan  Principal Problem:   Aspiration pneumonia (HCC) Active Problems:   TIA (transient ischemic attack)   Uncontrolled diabetes mellitus with hyperglycemia, without  long-term current use of insulin (HCC)   OSA (obstructive sleep apnea)   Hypertension   Mixed hyperlipidemia   Esophageal dysphagia   Hypothyroidism   Morbid obesity (HCC)   Major depressive disorder   Insomnia   Protein-calorie malnutrition (HCC)   Tobacco use   Problems with swallowing and mastication   AKI (acute kidney injury) (HCC)   AMS (altered mental status)   DNR (do not resuscitate)   B12 deficiency   Assessment and Plan:  * Aspiration pneumonia (HCC) Unasyn per pharmacy Blood cultures x 2 are in process Recheck procalcitonin in the a.m. Aspiration and fall precaution Check CBC in the a.m. Will avoid BiPAP at this time given history of recurrent aspiration and high risk for aspiration  Uncontrolled diabetes mellitus with hyperglycemia, without long-term current use of insulin (HCC) Insulin-dependent diabetes mellitus type 2 Hemoglobin A1c from 05/07/2023: Was 7.8% Check A1c on admission Oral antiglycemic medications will not be resumed on admission Insulin SSI with at bedtime coverage ordered Goal inpatient blood glucose levels 140-180  Hypothyroidism Home levothyroxine 150 mcg daily resumed on admission  Morbid obesity (HCC) This meets criteria for morbid obesity based on the presence of 1 or more chronic comorbidities. 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 This complicates overall care and prognosis.  B12 deficiency Per B12 level on 04/21/2023 at 176 I do not see B12 medication given or home B12 supplementation Vitamin B12 1000 mcg IM one-time dose ordered on admission Recommend outpatient follow-up and treatment as appropriate  AMS (altered mental status) CT head wo contrast ordered on admission  AKI (acute kidney injury) (HCC) LR infusion at 150 mL/h, 1 day ordered on admission Recheck  BMP in a.m.  Chart reviewed.   DVT prophylaxis: Heparin 5000 units subcutaneous Code Status: DNR/DNI Diet: N.p.o., high aspiration risk Family Communication: Attempted to  call POA, Pierce Crane at [phone], no pickup, voicemail message was left. Disposition Plan: Pending clinical course Consults called: None at this time Admission status: Stepdown, inpatient  Past Medical History:  Diagnosis Date   Anxiety disorder 05/05/2014   unspecifed   Arthritis    CHF (congestive heart failure) (HCC)    Colonic polyp    COPD (chronic obstructive pulmonary disease) (HCC)    Depression    Diabetes mellitus without complication (HCC)    Dysphagia    Dyspnea    Elevated prostate specific antigen (PSA)    Essential (primary) hypertension 05/05/2014   GERD (gastroesophageal reflux disease)    GI bleed    due to diverticulitis    Heart murmur    hx of 1960s   Hemorrhoids    History of thyroid cancer 05/05/2014   Hypercholesteremia    Hyperlipidemia    Hypertension    Hypothyroidism 05/05/2014   Male erectile dysfunction 12/29/2012   Malignant neoplasm of prostate (HCC)    10/30/2011 T1c - Identified by needle biopsy . Gleason 7 (3+4) 5 of 12 cores, Bilateral    Pneumonia    Prediabetes 06/22/2017   Sleep apnea    CPAP , report on chart , not used in last 2 weeks    Urinary obstruction    not elsewhere classified   Past Surgical History:  Procedure Laterality Date   BACK SURGERY     lumbar 1987    BACK SURGERY     C-6- Plate&Pin   BIOPSY  07/02/2023   Procedure: BIOPSY;  Surgeon: Jaynie Collins, DO;  Location: [facility] ENDOSCOPY;  Service: Gastroenterology;;   CATARACT EXTRACTION W/PHACO Right 09/15/2018   Procedure: CATARACT EXTRACTION PHACO AND INTRAOCULAR LENS PLACEMENT (IOC) RIGHT;  Surgeon: Lockie Mola, MD;  Location: [facility] SURGERY CNTR;  Service: Ophthalmology;  Laterality: Right;  Requests to be last case   CATARACT EXTRACTION W/PHACO Left 10/06/2018   Procedure: CATARACT EXTRACTION PHACO AND INTRAOCULAR LENS PLACEMENT (IOC)  LEFT;  Surgeon: Lockie Mola, MD;  Location: [facility] SURGERY [facility];  Service: Ophthalmology;   Laterality: Left;   CERVICAL DISC SURGERY     COLONOSCOPY     10/06/2011, 06/05/2006, 05/06/2002 adematous polyps: CBF 09/2014; Recall Ltr mailed 02/27/2015 (dw)   COLONOSCOPY WITH PROPOFOL N/A 07/06/2015   Procedure: COLONOSCOPY WITH PROPOFOL;  Surgeon: Scot Jun, MD;  Location: [facility] ENDOSCOPY;  Service: Endoscopy;  Laterality: N/A;   COLONOSCOPY WITH PROPOFOL N/A 09/01/2018   Procedure: COLONOSCOPY WITH PROPOFOL;  Surgeon: Scot Jun, MD;  Location: [facility] ENDOSCOPY;  Service: Endoscopy;  Laterality: N/A;   COLONOSCOPY WITH PROPOFOL N/A 04/17/2020   Procedure: COLONOSCOPY WITH PROPOFOL;  Surgeon: Midge Minium, MD;  Location: [facility] END[facility]COPY;  Service: Endoscopy;  Laterality: N/A;   ESOPHAGOGASTRODUODENOSCOPY (EGD) WITH PROPOFOL N/A 04/17/2020   Procedure: ESOPHAGOGASTRODUODENOSCOPY (EGD) WITH PROPOFOL;  Surgeon: Midge Minium, MD;  Location: [facility] ENDOSCOPY;  Service: Endoscopy;  Laterality: N/A;   ESOPHAGOGASTRODUODENOSCOPY (EGD) WITH PROPOFOL N/A 07/02/2023   Procedure: ESOPHAGOGASTRODUODENOSCOPY (EGD) WITH PROPOFOL;  Surgeon: Jaynie Collins, DO;  Location: [facility] ENDOSCOPY;  Service: Gastroenterology;  Laterality: N/A;   EYE SURGERY     HERNIA REPAIR     umbilical hernia repair    LAMINECTOMY FOR EXCISION / EVACUATION INTRASPINAL LESION     lumbar   OTHER SURGICAL HISTORY     right  rotator cuff surgery    OTHER SURGICAL HISTORY     bilateral tubes in ears    POSTERIOR CERVICAL LAMINECTOMY N/A 10/27/2020   Procedure: POSTERIOR CERVICAL LAMINECTOMY CERVICAL THREE-CERVICAL SIX;  Surgeon: Jadene Pierini, MD;  Location: [facility] OR;  Service: Neurosurgery;  Laterality: N/A;   POSTERIOR LAMINECTOMY / DECOMPRESSION CERVICAL SPINE     PROSTATE BIOPSY     10/22/2011 Volume:55.8 cc's, PSA:4.5, Free PSA:12%   ROBOT ASSISTED LAPAROSCOPIC RADICAL PROSTATECTOMY  12/15/2011   Procedure: ROBOTIC ASSISTED LAPAROSCOPIC RADICAL PROSTATECTOMY LEVEL 2;  Surgeon: Crecencio Mc, MD;  Location: [facility] ORS;   Service: Urology;  Laterality: N/A;       ROTATOR CUFF REPAIR Right    SHOULDER ARTHROSCOPY WITH SUBACROMIAL DECOMPRESSION, ROTATOR CUFF REPAIR AND BICEP TENDON REPAIR Left 11/07/2021   Procedure: SHOULDER ARTHROSCOPY WITH DEBRIDEMENT, DECOMPRESSION, ROTATOR CUFF REPAIR AND BICEPS TENODESIS.;  Surgeon: Christena Flake, MD;  Location: [facility] ORS;  Service: Orthopedics;  Laterality: Left;   TONSILLECTOMY     TOTAL THYROIDECTOMY     Social History:  reports that he quit smoking about 4 years ago. His smoking use included cigarettes. He started smoking about 40 years ago. He has a 17.5 pack-year smoking history. He has never used smokeless tobacco. He reports that he does not currently use alcohol after a past usage of about 8.0 standard drinks of alcohol per week. He reports that he does not use drugs.  No Known Allergies Family History  Problem Relation Age of Onset   Hypertension Mother    Anxiety disorder Mother    Stroke Mother        "mini strokes"   Coronary artery disease Mother    Heart attack Mother    Emphysema Father    Heart disease Father    Scoliosis Father    Coronary artery disease Father    GU problems Neg Hx    Kidney disease Neg Hx    Prostate cancer Neg Hx    Family history: Family history reviewed and not pertinent.  Prior to Admission medications   Medication Sig Start Date End Date Taking? Authorizing Provider  albuterol (VENTOLIN HFA) 108 (90 Base) MCG/ACT inhaler Inhale 2 puffs into the lungs every 6 (six) hours as needed for wheezing or shortness of breath.    [provider]  Artificial Tear Solution (GENTEAL TEARS) 0.1-0.2-0.3 % SOLN Place 1 drop into both eyes in the morning, at noon, in the evening, and at bedtime. 11/13/22   [provider]  atorvastatin (LIPITOR) 40 MG tablet Take 1 tablet (40 mg total) by mouth daily. 07/06/22 03/18/23  Hollice Espy, MD  Benzocaine-Menthol (CHLORASEPTIC MAX SORE THROAT) 15-10 MG LOZG Take 1 lozenge by  mouth every 4 (four) hours as needed (sore throat). 02/11/23   [provider]  bisacodyl (DULCOLAX) 10 MG suppository Place 10 mg rectally daily as needed for moderate constipation.    [provider]  cetirizine (ZYRTEC) 10 MG tablet Take 10 mg by mouth daily. 02/19/23   [provider]  clopidogrel (PLAVIX) 75 MG tablet Take 1 tablet (75 mg total) by mouth daily. 07/06/22   Hollice Espy, MD  cyclobenzaprine (FLEXERIL) 10 MG tablet Take 10 mg by mouth every 8 (eight) hours as needed for muscle spasms. 02/11/23   [provider]  ergocalciferol (VITAMIN D2) 1.25 MG (50000 UT) capsule Take 50,000 Units by mouth every Wednesday. Take every Monday and Wednesday    [provider]  FLUoxetine (PROZAC) 40  MG capsule Take 40 mg by mouth daily.    [provider]  fluticasone-salmeterol (WIXELA INHUB) 250-50 MCG/ACT AEPB Inhale 1 puff into the lungs in the morning and at bedtime.    [provider]  furosemide (LASIX) 40 MG tablet Take 1 tablet (40 mg total) by mouth daily. 03/24/23   Gillis Santa, MD  hydrocortisone 2.5 % cream Apply 1 application topically every 12 (twelve) hours as needed (per rectum, hemorrhoids).    [provider]  insulin glargine-yfgn (SEMGLEE) 100 UNIT/ML injection Inject 0.3 mLs (30 Units total) into the skin at bedtime. 02/10/23   Kathlen Mody, MD  insulin lispro (HUMALOG) 100 UNIT/ML injection Inject 10 Units into the skin 3 (three) times daily before meals.    [provider]  insulin lispro (HUMALOG) 100 UNIT/ML injection Inject per sliding scale 4 times daily. 100-149; 2 units, 150-199; 3 units, 200-249; 4 units, 250-299; 5 units, 300-349; 6 units, 350-399; 7 units, 400-449; 8 units, 450-499; 9 units, 500 and greater, call MD.    [provider]  levothyroxine (SYNTHROID) 150 MCG tablet Take 150 mcg by mouth at bedtime. 01/29/23   [provider]  LINZESS 290 MCG CAPS capsule Take  290 mcg by mouth daily. 06/09/22   [provider]  magnesium oxide (MAG-OX) 400 MG tablet Take 400 mg by mouth 2 (two) times daily. 01/25/21   [provider]  Olopatadine HCl 0.7 % SOLN Place 1 drop into both eyes daily.    [provider]  pantoprazole (PROTONIX) 40 MG tablet Take 1 tablet (40 mg total) by mouth daily. Patient taking differently: Take 40 mg by mouth 2 (two) times daily. 07/07/22   Hollice Espy, MD  polyethylene glycol (MIRALAX / GLYCOLAX) 17 g packet Take 17 g by mouth daily. Mix one tablespoon with 8oz of your favorite juice or water every day until you are having soft formed stools. Then start taking once daily if you didn't have a stool the day before. 11/22/22   Willy Eddy, MD  potassium chloride SA (KLOR-CON M) 20 MEQ tablet Take 1 tablet (20 mEq total) by mouth daily. 03/24/23   Gillis Santa, MD  pregabalin (LYRICA) 300 MG capsule Take 300 mg by mouth 2 (two) times daily. 02/04/23   [provider]  risperiDONE (RISPERDAL) 0.5 MG tablet Take 1 tablet (0.5 mg total) by mouth at bedtime. 02/10/23   Kathlen Mody, MD  senna-docusate (SENOKOT-S) 8.6-50 MG tablet Take 1 tablet by mouth 2 (two) times daily.    [provider]  simethicone (MYLICON) 125 MG chewable tablet Chew 125 mg by mouth 2 (two) times daily.    [provider]  SitaGLIPtin-MetFORMIN HCl [phone] MG TB24 Take 1 tablet by mouth daily. 07/06/22   Hollice Espy, MD  Wheat Dextrin (BENEFIBER ON THE GO) POWD Dissolve one packet in mouth daily. 03/12/23   [provider]   Physical Exam: Vitals:   11/15/23 1406 11/15/23 1407 11/15/23 1430 11/15/23 1500  BP: (!) 137/59  109/60 131/71  Pulse: 74  65 70  Resp: 18  16 14   Temp:      TempSrc:      SpO2: 100%  100% 99%  Weight:  136.1 kg    Height:  6\' 3"  (1.905 m)     Constitutional: appears acutely ill, lethargic, patient not following commands Eyes: PERRL, lids and conjunctivae  normal ENMT: Mucous membranes are dry. Posterior pharynx clear of any exudate or lesions. Age-appropriate dentition.  Unable to assess hearing. Neck: normal, supple, no masses, no thyromegaly Respiratory: clear to auscultation bilaterally, no wheezing, no crackles. Normal respiratory effort. No accessory muscle use.  Cardiovascular: Regular rate and rhythm, no murmurs / rubs / gallops. No extremity edema. 2+ pedal pulses. No carotid bruits.  Abdomen: Morbidly obese abdomen, no tenderness, no masses palpated, no hepatosplenomegaly. Bowel sounds positive.  Musculoskeletal: no clubbing / cyanosis. No joint deformity upper and lower extremities.  Unable to assess range of motion. No contractures, no atrophy.  Unable to assess muscle tone.  Skin: no rashes, lesions, ulcers. No induration.  Dry flaky skin of feet. Neurologic: Unable to assess sensation and strength Psychiatric: Unable to assess judgment and insight.  Patient is not alert and oriented.  Unable to assess mood.   EKG: independently reviewed, showing sinus rhythm with rate of 75, QTc 502, right bundle branch block  Chest x-ray on Admission: I personally reviewed and I agree with radiologist reading as below.  DG Chest Portable 1 View Result Date: 11/15/2023 CLINICAL DATA:  Shortness of breath.  Hypoxia.  Recent aspiration. EXAM: PORTABLE CHEST 1 VIEW COMPARISON:  03/18/2023 FINDINGS: Stable cardiomegaly and pulmonary vascular congestion. No evidence of pulmonary infiltrate or edema. IMPRESSION: Stable cardiomegaly and pulmonary vascular congestion. No acute findings. Electronically Signed   By: Danae Orleans M.D.   On: 11/15/2023 13:07   Labs on Admission: I have personally reviewed following labs  CBC: Recent Labs  Lab 11/15/23 1245  WBC 18.5*  NEUTROABS 16.4*  HGB 13.7  HCT 43.7  MCV 91.8  PLT 213   Basic Metabolic Panel: Recent Labs  Lab 11/15/23 1245  NA 142  K 4.9  CL 101  CO2 29  GLUCOSE 110*  BUN 26*  CREATININE  1.67*  CALCIUM 8.8*   GFR: Estimated Creatinine Clearance: 53.3 mL/min (A) (by C-G formula based on SCr of 1.67 mg/dL (H)).  Liver Function Tests: Recent Labs  Lab 11/15/23 1245  AST 17  ALT 14  ALKPHOS 121  BILITOT 0.8  PROT 7.3  ALBUMIN 3.2*   Urine analysis:    Component Value Date/Time   COLORURINE YELLOW (A) 11/15/2023 1319   APPEARANCEUR CLEAR (A) 11/15/2023 1319   LABSPEC 1.010 11/15/2023 1319   PHURINE 5.0 11/15/2023 1319   GLUCOSEU NEGATIVE 11/15/2023 1319   HGBUR NEGATIVE 11/15/2023 1319   BILIRUBINUR NEGATIVE 11/15/2023 1319   KETONESUR NEGATIVE 11/15/2023 1319   PROTEINUR NEGATIVE 11/15/2023 1319   NITRITE NEGATIVE 11/15/2023 1319   LEUKOCYTESUR NEGATIVE 11/15/2023 1319   CRITICAL CARE Performed by: Dr. Sedalia Muta  Total critical care time: 32 minutes  Critical care time was exclusive of separately billable procedures and treating other patients.  Critical care was necessary to treat or prevent imminent or life-threatening deterioration.  Critical care was time spent personally by me on the following activities: development of treatment, discussions with consultants, evaluation of patient's response to treatment, examination of patient, obtaining history from patient or surrogate, ordering and performing treatments and interventions, ordering and review of laboratory studies, ordering and review of radiographic studies, pulse oximetry and re-evaluation of patient's condition.  This document was prepared using Dragon Voice Recognition software and may include unintentional dictation errors.  Dr. Sedalia Muta [facility]  If 7PM-7AM, please contact overnight-coverage provider If 7AM-7PM, please contact day attending provider [email or web address]  11/15/2023, 4:50 PM

## 2023-11-16 DIAGNOSIS — E1165 Type 2 diabetes mellitus with hyperglycemia: Secondary | ICD-10-CM

## 2023-11-16 DIAGNOSIS — E538 Deficiency of other specified B group vitamins: Secondary | ICD-10-CM

## 2023-11-16 DIAGNOSIS — N179 Acute kidney failure, unspecified: Secondary | ICD-10-CM

## 2023-11-16 DIAGNOSIS — J690 Pneumonitis due to inhalation of food and vomit: Secondary | ICD-10-CM | POA: Diagnosis not present

## 2023-11-16 DIAGNOSIS — J9601 Acute respiratory failure with hypoxia: Secondary | ICD-10-CM

## 2023-11-16 DIAGNOSIS — E039 Hypothyroidism, unspecified: Secondary | ICD-10-CM

## 2023-11-16 DIAGNOSIS — E782 Mixed hyperlipidemia: Secondary | ICD-10-CM

## 2023-11-16 LAB — BASIC METABOLIC PANEL
Anion gap: 10 (ref 5–15)
BUN: 22 mg/dL (ref 8–23)
CO2: 27 mmol/L (ref 22–32)
Calcium: 8.2 mg/dL — ABNORMAL LOW (ref 8.9–10.3)
Chloride: 101 mmol/L (ref 98–111)
Creatinine, Ser: 1.33 mg/dL — ABNORMAL HIGH (ref 0.61–1.24)
GFR, Estimated: 54 mL/min — ABNORMAL LOW (ref >60)
Glucose, Bld: 98 mg/dL (ref 70–99)
Potassium: 4 mmol/L (ref 3.5–5.1)
Sodium: 138 mmol/L (ref 135–145)

## 2023-11-16 LAB — CBC
HCT: 40.6 % (ref 39.0–52.0)
Hemoglobin: 12.8 g/dL — ABNORMAL LOW (ref 13.0–17.0)
MCH: 28.8 pg (ref 26.0–34.0)
MCHC: 31.5 g/dL (ref 30.0–36.0)
MCV: 91.4 fL (ref 80.0–100.0)
Platelets: 171 10*3/uL (ref 150–400)
RBC: 4.44 MIL/uL (ref 4.22–5.81)
RDW: 17.4 % — ABNORMAL HIGH (ref 11.5–15.5)
WBC: 15.7 10*3/uL — ABNORMAL HIGH (ref 4.0–10.5)
nRBC: 0 % (ref 0.0–0.2)

## 2023-11-16 LAB — PROCALCITONIN: Procalcitonin: <0.1 ng/mL

## 2023-11-16 LAB — CBG MONITORING, ED
Glucose-Capillary: 95 mg/dL (ref 70–99)
Glucose-Capillary: 103 mg/dL — ABNORMAL HIGH (ref 70–99)
Glucose-Capillary: 154 mg/dL — ABNORMAL HIGH (ref 70–99)
Glucose-Capillary: 119 mg/dL — ABNORMAL HIGH (ref 70–99)

## 2023-11-16 MED ORDER — FLUOXETINE HCL 20 MG PO CAPS
40.0000 mg | ORAL_CAPSULE | Freq: Every day | ORAL | Status: DC
  Administered 2023-11-16 – 2023-11-19 (×4): 40 mg via ORAL
  Filled 2023-11-16 (×4): qty 2

## 2023-11-16 MED ORDER — MOMETASONE FURO-FORMOTEROL FUM 200-5 MCG/ACT IN AERO
2.0000 | INHALATION_SPRAY | Freq: Two times a day (BID) | RESPIRATORY_TRACT | Status: DC
  Administered 2023-11-16 – 2023-11-19 (×6): 2 via RESPIRATORY_TRACT
  Filled 2023-11-16: qty 8.8

## 2023-11-16 MED ORDER — SENNOSIDES-DOCUSATE SODIUM 8.6-50 MG PO TABS
1.0000 | ORAL_TABLET | Freq: Two times a day (BID) | ORAL | Status: DC
  Administered 2023-11-17 – 2023-11-19 (×4): 1 via ORAL
  Filled 2023-11-16 (×5): qty 1

## 2023-11-16 MED ORDER — RISPERIDONE 0.5 MG PO TABS
0.5000 mg | ORAL_TABLET | Freq: Every day | ORAL | Status: DC
  Administered 2023-11-16 – 2023-11-17 (×2): 0.5 mg via ORAL
  Filled 2023-11-16 (×4): qty 1

## 2023-11-16 MED ORDER — ALLOPURINOL 300 MG PO TABS
300.0000 mg | ORAL_TABLET | Freq: Every day | ORAL | Status: DC
Start: 2023-11-16 — End: 2023-11-19
  Administered 2023-11-16 – 2023-11-19 (×4): 300 mg via ORAL
  Filled 2023-11-16 (×2): qty 1
  Filled 2023-11-16 (×3): qty 3

## 2023-11-16 MED ORDER — FUROSEMIDE 40 MG PO TABS
40.0000 mg | ORAL_TABLET | Freq: Every day | ORAL | Status: DC
  Administered 2023-11-16 – 2023-11-19 (×4): 40 mg via ORAL
  Filled 2023-11-16 (×4): qty 1

## 2023-11-16 MED ORDER — VITAMIN D (ERGOCALCIFEROL) 1.25 MG (50000 UNIT) PO CAPS
50000.0000 [IU] | ORAL_CAPSULE | ORAL | Status: DC
  Administered 2023-11-18: 50000 [IU] via ORAL
  Filled 2023-11-16: qty 1

## 2023-11-16 MED ORDER — ALBUTEROL SULFATE (2.5 MG/3ML) 0.083% IN NEBU: 2.5000 mg | INHALATION_SOLUTION | Freq: Four times a day (QID) | RESPIRATORY_TRACT | Status: DC | PRN

## 2023-11-16 MED ORDER — MAGNESIUM OXIDE -MG SUPPLEMENT 400 (240 MG) MG PO TABS
400.0000 mg | ORAL_TABLET | Freq: Two times a day (BID) | ORAL | Status: DC
  Administered 2023-11-17 – 2023-11-19 (×4): 400 mg via ORAL
  Filled 2023-11-16 (×5): qty 1

## 2023-11-16 MED ORDER — PREGABALIN 75 MG PO CAPS
300.0000 mg | ORAL_CAPSULE | Freq: Two times a day (BID) | ORAL | Status: DC
  Administered 2023-11-16 – 2023-11-19 (×5): 300 mg via ORAL
  Filled 2023-11-16 (×2): qty 4
  Filled 2023-11-16: qty 6
  Filled 2023-11-16 (×3): qty 4

## 2023-11-16 MED ORDER — CLOPIDOGREL BISULFATE 75 MG PO TABS
75.0000 mg | ORAL_TABLET | Freq: Every day | ORAL | Status: DC
Start: 2023-11-16 — End: 2023-11-19
  Administered 2023-11-16 – 2023-11-19 (×4): 75 mg via ORAL
  Filled 2023-11-16 (×4): qty 1

## 2023-11-16 MED ORDER — LINACLOTIDE 145 MCG PO CAPS
290.0000 ug | ORAL_CAPSULE | Freq: Every day | ORAL | Status: DC
  Filled 2023-11-16 (×4): qty 1
  Filled 2023-11-16: qty 2
  Filled 2023-11-16 (×3): qty 1

## 2023-11-16 MED ORDER — VITAMIN B-12 1000 MCG PO TABS
1000.0000 ug | ORAL_TABLET | Freq: Every day | ORAL | Status: DC
  Administered 2023-11-16 – 2023-11-19 (×4): 1000 ug via ORAL
  Filled 2023-11-16 (×3): qty 1
  Filled 2023-11-16: qty 2

## 2023-11-16 MED ORDER — CYCLOBENZAPRINE HCL 10 MG PO TABS
10.0000 mg | ORAL_TABLET | Freq: Three times a day (TID) | ORAL | Status: DC | PRN
  Administered 2023-11-16 – 2023-11-18 (×3): 10 mg via ORAL
  Filled 2023-11-16 (×3): qty 1

## 2023-11-16 MED ORDER — POLYVINYL ALCOHOL 1.4 % OP SOLN
1.0000 [drp] | Freq: Two times a day (BID) | OPHTHALMIC | Status: DC
  Administered 2023-11-16 – 2023-11-19 (×3): 1 [drp] via OPHTHALMIC
  Filled 2023-11-16: qty 15

## 2023-11-16 MED ORDER — BISACODYL 10 MG RE SUPP: 10.0000 mg | Freq: Every day | RECTAL | Status: DC | PRN

## 2023-11-16 MED ORDER — ROSUVASTATIN CALCIUM 10 MG PO TABS
10.0000 mg | ORAL_TABLET | Freq: Every day | ORAL | Status: DC
  Administered 2023-11-16 – 2023-11-17 (×2): 10 mg via ORAL
  Filled 2023-11-16 (×3): qty 1

## 2023-11-16 NOTE — Evaluation (Signed)
Clinical/Bedside Swallow Evaluation Patient Details  Name: [name] [name] MRN: [id number] Date of Birth: October 28, 1946  Today's Date: 11/16/2023 Time: SLP Start Time (ACUTE ONLY): 1135 SLP Stop Time (ACUTE ONLY): 1155 SLP Time Calculation (min) (ACUTE ONLY): 20 min  Past Medical History:  Past Medical History:  Diagnosis Date   Anxiety disorder 05/05/2014   unspecifed   Arthritis    CHF (congestive heart failure) (HCC)    Colonic polyp    COPD (chronic obstructive pulmonary disease) (HCC)    Depression    Diabetes mellitus without complication (HCC)    Dysphagia    Dyspnea    Elevated prostate specific antigen (PSA)    Essential (primary) hypertension 05/05/2014   GERD (gastroesophageal reflux disease)    GI bleed    due to diverticulitis    Heart murmur    hx of 1960s   Hemorrhoids    History of thyroid cancer 05/05/2014   Hypercholesteremia    Hyperlipidemia    Hypertension    Hypothyroidism 05/05/2014   Male erectile dysfunction 12/29/2012   Malignant neoplasm of prostate (HCC)    10/30/2011 T1c - Identified by needle biopsy . Gleason 7 (3+4) 5 of 12 cores, Bilateral    Pneumonia    Prediabetes 06/22/2017   Sleep apnea    CPAP , report on chart , not used in last 2 weeks    Urinary obstruction    not elsewhere classified   Past Surgical History:  Past Surgical History:  Procedure Laterality Date   BACK SURGERY     lumbar 1987    BACK SURGERY     C-6- Plate&Pin   BIOPSY  07/02/2023   Procedure: BIOPSY;  Surgeon: Jaynie Collins, DO;  Location: [facility] ENDOSCOPY;  Service: Gastroenterology;;   CATARACT EXTRACTION W/PHACO Right 09/15/2018   Procedure: CATARACT EXTRACTION PHACO AND INTRAOCULAR LENS PLACEMENT (IOC) RIGHT;  Surgeon: Lockie Mola, MD;  Location: [facility];  Service: Ophthalmology;  Laterality: Right;  Requests to be last case   CATARACT EXTRACTION W/PHACO Left 10/06/2018   Procedure: CATARACT EXTRACTION PHACO AND INTRAOCULAR LENS  PLACEMENT (IOC)  LEFT;  Surgeon: Lockie Mola, MD;  Location: [facility];  Service: Ophthalmology;  Laterality: Left;   CERVICAL DISC SURGERY     COLONOSCOPY     10/06/2011, 06/05/2006, 05/06/2002 adematous polyps: CBF 09/2014; Recall Ltr mailed 02/27/2015 (dw)   COLONOSCOPY WITH PROPOFOL N/A 07/06/2015   Procedure: COLONOSCOPY WITH PROPOFOL;  Surgeon: Scot Jun, MD;  Location: [facility] ENDOSCOPY;  Service: Endoscopy;  Laterality: N/A;   COLONOSCOPY WITH PROPOFOL N/A 09/01/2018   Procedure: COLONOSCOPY WITH PROPOFOL;  Surgeon: Scot Jun, MD;  Location: [facility] ENDOSCOPY;  Service: Endoscopy;  Laterality: N/A;   COLONOSCOPY WITH PROPOFOL N/A 04/17/2020   Procedure: COLONOSCOPY WITH PROPOFOL;  Surgeon: Midge Minium, MD;  Location: [facility] END[facility]COPY;  Service: Endoscopy;  Laterality: N/A;   ESOPHAGOGASTRODUODENOSCOPY (EGD) WITH PROPOFOL N/A 04/17/2020   Procedure: ESOPHAGOGASTRODUODENOSCOPY (EGD) WITH PROPOFOL;  Surgeon: Midge Minium, MD;  Location: [facility] ENDOSCOPY;  Service: Endoscopy;  Laterality: N/A;   ESOPHAGOGASTRODUODENOSCOPY (EGD) WITH PROPOFOL N/A 07/02/2023   Procedure: ESOPHAGOGASTRODUODENOSCOPY (EGD) WITH PROPOFOL;  Surgeon: Jaynie Collins, DO;  Location: [facility] ENDOSCOPY;  Service: Gastroenterology;  Laterality: N/A;   EYE SURGERY     HERNIA REPAIR     umbilical hernia repair    LAMINECTOMY FOR EXCISION / EVACUATION INTRASPINAL LESION     lumbar   OTHER SURGICAL HISTORY     right rotator cuff surgery  OTHER SURGICAL HISTORY     bilateral tubes in ears    POSTERIOR CERVICAL LAMINECTOMY N/A 10/27/2020   Procedure: POSTERIOR CERVICAL LAMINECTOMY CERVICAL THREE-CERVICAL SIX;  Surgeon: Jadene Pierini, MD;  Location: [facility] OR;  Service: Neurosurgery;  Laterality: N/A;   POSTERIOR LAMINECTOMY / DECOMPRESSION CERVICAL SPINE     PROSTATE BIOPSY     10/22/2011 Volume:55.8 cc's, PSA:4.5, Free PSA:12%   ROBOT ASSISTED LAPAROSCOPIC RADICAL PROSTATECTOMY  12/15/2011    Procedure: ROBOTIC ASSISTED LAPAROSCOPIC RADICAL PROSTATECTOMY LEVEL 2;  Surgeon: Crecencio Mc, MD;  Location: [facility] ORS;  Service: Urology;  Laterality: N/A;       ROTATOR CUFF REPAIR Right    SHOULDER ARTHROSCOPY WITH SUBACROMIAL DECOMPRESSION, ROTATOR CUFF REPAIR AND BICEP TENDON REPAIR Left 11/07/2021   Procedure: SHOULDER ARTHROSCOPY WITH DEBRIDEMENT, DECOMPRESSION, ROTATOR CUFF REPAIR AND BICEPS TENODESIS.;  Surgeon: Christena Flake, MD;  Location: [facility] ORS;  Service: Orthopedics;  Laterality: Left;   TONSILLECTOMY     TOTAL THYROIDECTOMY     HPI:  [name] is a 77 year old male with history of hypothyroid, hyperlipidemia, depression, anxiety, GERD, neuropathy, insulin-dependent diabetes mellitus, who presents to the emergency department for chief concerns of shortness of breath from [facility]. Per [facility] staff, patient was seen aspirating on a hot dog last evening, 11/14/2023.  Patient was reported to have declined since then. CT Head 11/15/23: No acute intracranial process. CT Chest 11/15/23: 1. There are multiple patchy areas of atelectasis and scarring throughout bilateral lungs. However, no lung mass, consolidation, pleural effusion or pneumothorax. No acute inflammatory process identified within the abdomen or pelvis.    Assessment / Plan / Recommendation  Clinical Impression  Pt seen for bedside swallow assessment in the setting of concern for aspiration PNA following choking event. Pt resting on room air at O2 of 91 upon therapist arrival. Pt seen with trials of thin liquids (via straw), puree, and regular solids. Pt with noted impulsive intake patterns, large sequential sips of thin liquids, leading to cough response and dip in O2 saturations to 88. Following time for recovery O2 leveled back out to 91. Single sips via cup/straw completed without overt s/sx of aspiration. Solid trials completed with pt demonstrating independent manipulation and clearance of solids and no  s/sx of aspiration. Pt reports reflux symptoms (regurgitation with liquds and globus sensation with solids). Pt known to GI services, with EGD completed on 07/02/23, revealing, "abnormal esophageal motility, suspicious for presbyesophagus. Dilated." Education shared regarding aspiration precautions (with emphasis on slow intake and small sips) in setting of choking episode and modification of soft solids for esophageal concerns. Pt expressing desire for continuation of regular diet despite risk for aspiration. MD made aware of pt preference.       Based on impulsivity, current respiratory/pulmonary status, known esophageal dysphagia, age, and current debility, pt is increased risk for aspiration. Recommend supervision for PO intake and adherence to aspiration precautions (slow rate, small bites, alert, and elevated HOB during and after PO intake). SLP will follow up x1 for reinforcement of aspiration precautions. SLP Visit Diagnosis: Dysphagia, unspecified (R13.10)    Aspiration Risk  Moderate aspiration risk    Diet Recommendation   Age appropriate regular (per pt preference)  Medication Administration: Whole meds with liquid    Other  Recommendations Oral Care Recommendations: Oral care BID    Recommendations for follow up therapy are one component of a multi-disciplinary discharge planning process, led by the attending physician.  Recommendations may be updated based  on patient status, additional functional criteria and insurance authorization.  Follow up Recommendations No SLP follow up      Assistance Recommended at Discharge    Functional Status Assessment Patient has had a recent decline in their functional status and demonstrates the ability to make significant improvements in function in a reasonable and predictable amount of time.  Frequency and Duration min 1 x/week  1 week       Prognosis Prognosis for improved oropharyngeal function: Fair Barriers to Reach Goals:  (baseline  esophageal function, impulsivity)      Swallow Study   General Date of Onset: 11/16/23 HPI: [name] is a 77 year old male with history of hypothyroid, hyperlipidemia, depression, anxiety, GERD, neuropathy, insulin-dependent diabetes mellitus, who presents to the emergency department for chief concerns of shortness of breath from [facility]. Per [facility] staff, patient was seen aspirating on a hot dog last evening, 11/14/2023.  Patient was reported to have declined since then. CT Head 11/15/23: No acute intracranial process. CT Chest 11/15/23: 1. There are multiple patchy areas of atelectasis and scarring throughout bilateral lungs. However, no lung mass, consolidation, pleural effusion or pneumothorax. No acute inflammatory process identified within the abdomen or pelvis. Type of Study: Bedside Swallow Evaluation Previous Swallow Assessment: Last BSE completed 03/2023- with recommendation for regular solids and thin liquids Diet Prior to this Study: NPO Temperature Spikes Noted: No (WBC 15.7) Respiratory Status: Room air History of Recent Intubation: No Behavior/Cognition: Alert;Cooperative Oral Cavity Assessment: Within Functional Limits Oral Care Completed by SLP: Recent completion by staff Oral Cavity - Dentition: Poor condition Vision: Functional for self-feeding Self-Feeding Abilities: Able to feed self Patient Positioning: Upright in bed Baseline Vocal Quality: Normal Volitional Cough: Congested Volitional Swallow: Able to elicit    Oral/Motor/Sensory Function Overall Oral Motor/Sensory Function: Within functional limits   Ice Chips Ice chips: Not tested   Thin Liquid Thin Liquid: Impaired Presentation: Cup;Straw Pharyngeal  Phase Impairments: Cough - Immediate Other Comments: cough with sequential sips    Nectar Thick Nectar Thick Liquid: Not tested   Honey Thick Honey Thick Liquid: Not tested   Puree Puree: Within functional limits   Solid   [place]  Kloie Whiting Clapp  MS [facility] SLP  Solid: Within functional limits      [place] J Clapp 11/16/2023,12:49 PM

## 2023-11-16 NOTE — ED Notes (Signed)
This RN notified Pharmacy of missing meds

## 2023-11-16 NOTE — ED Notes (Signed)
Pt's linen changed. Pillow placed under left hip.

## 2023-11-16 NOTE — ED Notes (Signed)
Pt was assisted with ice chips per pt request.

## 2023-11-16 NOTE — ED Notes (Signed)
This Rn assumed care of patient at 40. At this time, the tasks of Cardiac Monitor Assessment, RN to call RRT, and Initiate Adult Central Line Maintenance and Catheter protol are overdue. This RN was not present at facility during its due time and this [name] [name] is unsure as to why it was not completed during its initial time of being due. At this time, this RN will complete tasks due during the assigned shift but is unable to complete items due prior to this RN being assigned to said patient; charge RN made aware of outstanding items and current situation.

## 2023-11-16 NOTE — ED Notes (Signed)
Pt was turned to right side tilt. Heels floated with pillows and pt is being feed icechips.

## 2023-11-16 NOTE — ED Notes (Addendum)
Pt decreased to 88% RA. 2L [place] placed on and pt at 95%. Pillow placed under right hip

## 2023-11-16 NOTE — ED Notes (Signed)
Pt was given ice chips for hrydation and per pt request.

## 2023-11-16 NOTE — ED Notes (Signed)
Pt was given 3 small whole tablets with ice water. Pt tolerated that well.

## 2023-11-17 DIAGNOSIS — J9601 Acute respiratory failure with hypoxia: Secondary | ICD-10-CM | POA: Diagnosis not present

## 2023-11-17 DIAGNOSIS — N179 Acute kidney failure, unspecified: Secondary | ICD-10-CM | POA: Diagnosis not present

## 2023-11-17 DIAGNOSIS — E1165 Type 2 diabetes mellitus with hyperglycemia: Secondary | ICD-10-CM | POA: Diagnosis not present

## 2023-11-17 DIAGNOSIS — J690 Pneumonitis due to inhalation of food and vomit: Secondary | ICD-10-CM | POA: Diagnosis not present

## 2023-11-17 LAB — BASIC METABOLIC PANEL
Anion gap: 11 (ref 5–15)
BUN: 18 mg/dL (ref 8–23)
CO2: 27 mmol/L (ref 22–32)
Calcium: 8.2 mg/dL — ABNORMAL LOW (ref 8.9–10.3)
Chloride: 103 mmol/L (ref 98–111)
Creatinine, Ser: 1.21 mg/dL (ref 0.61–1.24)
GFR, Estimated: >60 mL/min (ref >60)
Glucose, Bld: 120 mg/dL — ABNORMAL HIGH (ref 70–99)
Potassium: 3.6 mmol/L (ref 3.5–5.1)
Sodium: 141 mmol/L (ref 135–145)

## 2023-11-17 LAB — GLUCOSE, CAPILLARY
Glucose-Capillary: 168 mg/dL — ABNORMAL HIGH (ref 70–99)
Glucose-Capillary: 119 mg/dL — ABNORMAL HIGH (ref 70–99)
Glucose-Capillary: 141 mg/dL — ABNORMAL HIGH (ref 70–99)

## 2023-11-17 LAB — CBC
HCT: 38.5 % — ABNORMAL LOW (ref 39.0–52.0)
Hemoglobin: 12.1 g/dL — ABNORMAL LOW (ref 13.0–17.0)
MCH: 28.8 pg (ref 26.0–34.0)
MCHC: 31.4 g/dL (ref 30.0–36.0)
MCV: 91.7 fL (ref 80.0–100.0)
Platelets: 167 10*3/uL (ref 150–400)
RBC: 4.2 MIL/uL — ABNORMAL LOW (ref 4.22–5.81)
RDW: 17 % — ABNORMAL HIGH (ref 11.5–15.5)
WBC: 13.5 10*3/uL — ABNORMAL HIGH (ref 4.0–10.5)
nRBC: 0 % (ref 0.0–0.2)

## 2023-11-17 LAB — CBG MONITORING, ED: Glucose-Capillary: 107 mg/dL — ABNORMAL HIGH (ref 70–99)

## 2023-11-17 LAB — HEMOGLOBIN A1C
Hgb A1c MFr Bld: 6.3 % — ABNORMAL HIGH (ref 4.8–5.6)
Mean Plasma Glucose: 134 mg/dL

## 2023-11-17 NOTE — Plan of Care (Signed)

## 2023-11-17 NOTE — NC FL2 (Signed)
 [place]  MEDICAID FL2 LEVEL OF CARE FORM     IDENTIFICATION  Patient Name: [name] [name] Birthdate: 1945/11/27 Sex: male Admission Date (Current Location): 11/15/2023  [place] and [place] Number:  Chiropodist and Address:  [facility], [address], [place], [place] [address]      Provider Number: [id number]  Attending Physician Name and Address:  Darci Pore, MD  Relative Name and Phone Number:       Current Level of Care: Hospital Recommended Level of Care: Skilled Nursing Facility Prior Approval Number:    Date Approved/Denied:   PASRR Number: [id number]  Discharge Plan: SNF    Current Diagnoses: Patient Active Problem List   Diagnosis Date Noted   Aspiration pneumonia (HCC) 11/15/2023   DNR (do not resuscitate) 11/15/2023   B12 deficiency 11/15/2023   Drug-induced encephalopathy 03/21/2023   AMS (altered mental status) 03/18/2023   Elevated serum creatinine 03/18/2023   Acute encephalopathy 02/05/2023   Lethargy 02/04/2023   Cyanosis 02/04/2023   Speech abnormality 02/04/2023   Bradycardia 02/04/2023   Esophageal thickening 02/04/2023   Liver nodule 02/04/2023   Cerebral infarct (HCC) 02/04/2023   Dehydration 02/04/2023   TIA (transient ischemic attack) 07/05/2022   Abnormal MRI of head 07/05/2022   Uncontrolled diabetes mellitus with hyperglycemia, without long-term current use of insulin  (HCC)    Morbid obesity (HCC)    Cervical myelopathy with history of cervical discectomy and fusion (HCC)    Esophageal dysphagia 11/18/2020   Elevated BUN 11/18/2020   AKI (acute kidney injury) (HCC) 11/18/2020   HCAP (healthcare-associated pneumonia)    Mucus plugging of bronchi    Left cervical radiculopathy 10/26/2020   Acute cervical radiculopathy 10/26/2020   Pressure injury of skin 10/26/2020   Problems with swallowing and mastication    Stricture and stenosis of esophagus    Hx of colonic polyps    Polyp of  ascending colon    Bilateral carpal tunnel syndrome 02/13/2020   Neurocardiogenic syncope 03/02/2019   Lymphedema 02/04/2019   Tobacco use 02/03/2019   CHF (congestive heart failure) (HCC) 12/23/2018   Chronic diastolic CHF (congestive heart failure) (HCC) 11/27/2018   Cellulitis 11/27/2018   Hypothyroidism 11/27/2018   OSA (obstructive sleep apnea) 11/27/2018   Hypertension 11/27/2018   Mixed hyperlipidemia 11/27/2018   Protein-calorie malnutrition (HCC) 03/02/2018   Neuropathy 03/02/2018   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   Difficulty in voiding 03/01/2018   Vitamin D  deficiency 03/01/2018   Vitamin B12 deficiency 03/01/2018   Insomnia 03/01/2018   COPD (chronic obstructive pulmonary disease) (HCC) 02/11/2018   Pain due to onychomycosis of nail 02/11/2018   Onychomycosis 02/11/2018   Traumatic ecchymosis of foot, right, initial encounter 02/11/2018   Xerostomia 02/11/2018   Frequent falls 02/11/2018   Acute respiratory failure with hypoxia (HCC) 02/07/2018   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   History of thyroid  cancer 05/05/2014    Orientation RESPIRATION BLADDER Height & Weight     Self, Time, Situation, Place  Normal Incontinent Weight: 300 lb (136.1 kg) Height:  6' 3 (190.5 cm)  BEHAVIORAL SYMPTOMS/MOOD NEUROLOGICAL BOWEL NUTRITION STATUS   (None)  (None) Continent Diet (Heart heathy/carb modified)  AMBULATORY STATUS COMMUNICATION OF NEEDS Skin     Verbally Other (Comment) (Erythema/redness.)                       Personal Care Assistance Level of Assistance  Functional Limitations Info  Sight, Hearing, Speech Sight Info: Adequate Hearing Info: Adequate Speech Info: Impaired (Slurred/Dysarthria)    SPECIAL CARE FACTORS FREQUENCY                       Contractures Contractures Info: Not present    Additional Factors Info  Code Status, Allergies Code Status Info: DNR Allergies Info: NKDA            Current Medications (11/17/2023):  This is the current hospital active medication list Current Facility-Administered Medications  Medication Dose Route Frequency Provider Last Rate Last Admin   acetaminophen  (TYLENOL ) tablet 650 mg  650 mg Oral Q6H PRN Cox, Amy N, DO   650 mg at 11/17/23 1023   Or   acetaminophen  (TYLENOL ) suppository 650 mg  650 mg Rectal Q6H PRN Cox, Amy N, DO       albuterol  (PROVENTIL ) (2.5 MG/3ML) 0.083% nebulizer solution 2.5 mg  2.5 mg Inhalation Q6H PRN Darci Pore, MD       allopurinol  (ZYLOPRIM ) tablet 300 mg  300 mg Oral Daily Sreeram, Narendranath, MD   300 mg at 11/17/23 1024   Ampicillin -Sulbactam (UNASYN ) 3 g in sodium chloride  0.9 % 100 mL IVPB  3 g Intravenous Q6H Thompson, Amy C, RPH 200 mL/hr at 11/17/23 1201 3 g at 11/17/23 1201   bisacodyl  (DULCOLAX) suppository 10 mg  10 mg Rectal Daily PRN Sreeram, Narendranath, MD       clopidogrel  (PLAVIX ) tablet 75 mg  75 mg Oral Daily Sreeram, Narendranath, MD   75 mg at 11/17/23 1024   cyanocobalamin  (VITAMIN B12) tablet 1,000 mcg  1,000 mcg Oral Daily Sreeram, Narendranath, MD   1,000 mcg at 11/17/23 1200   cyclobenzaprine  (FLEXERIL ) tablet 10 mg  10 mg Oral Q8H PRN Sreeram, Narendranath, MD   10 mg at 11/17/23 1023   FLUoxetine  (PROZAC ) capsule 40 mg  40 mg Oral Daily Sreeram, Narendranath, MD   40 mg at 11/17/23 1205   furosemide  (LASIX ) tablet 40 mg  40 mg Oral Daily Sreeram, Narendranath, MD   40 mg at 11/17/23 1026   heparin  injection 5,000 Units  5,000 Units Subcutaneous Q8H Cox, Amy N, DO   5,000 Units at 11/17/23 1447   insulin  aspart (novoLOG ) injection 0-5 Units  0-5 Units Subcutaneous QHS Cox, Amy N, DO       insulin  aspart (novoLOG ) injection 0-9 Units  0-9 Units Subcutaneous TID WC Cox, Amy N, DO   2 Units at 11/17/23 1211   levothyroxine  (SYNTHROID ) tablet 150 mcg  150 mcg Oral QHS Cox, Amy N, DO   150 mcg at 11/17/23 9453   linaclotide  (LINZESS ) capsule 290 mcg  290 mcg Oral Daily Sreeram,  Narendranath, MD       magnesium  oxide (MAG-OX) tablet 400 mg  400 mg Oral BID Sreeram, Narendranath, MD   400 mg at 11/17/23 1026   mometasone -formoterol  (DULERA ) 200-5 MCG/ACT inhaler 2 puff  2 puff Inhalation BID Sreeram, Narendranath, MD   2 puff at 11/17/23 0805   ondansetron  (ZOFRAN ) tablet 4 mg  4 mg Oral Q6H PRN Cox, Amy N, DO       Or   ondansetron  (ZOFRAN ) injection 4 mg  4 mg Intravenous Q6H PRN Cox, Amy N, DO       polyvinyl alcohol  (LIQUIFILM TEARS) 1.4 % ophthalmic solution 1 drop  1 drop Both Eyes BID Sreeram, Narendranath, MD   1 drop at 11/16/23 2251   pregabalin  (LYRICA ) capsule 300  mg  300 mg Oral BID Sreeram, Narendranath, MD   300 mg at 11/17/23 1025   risperiDONE  (RISPERDAL ) tablet 0.5 mg  0.5 mg Oral QHS Sreeram, Narendranath, MD   0.5 mg at 11/16/23 2253   rosuvastatin  (CRESTOR ) tablet 10 mg  10 mg Oral QHS Sreeram, Narendranath, MD   10 mg at 11/16/23 2250   senna-docusate (Senokot-S) tablet 1 tablet  1 tablet Oral BID Sreeram, Narendranath, MD   1 tablet at 11/17/23 1026   [START ON 11/18/2023] Vitamin D  (Ergocalciferol ) (DRISDOL ) 1.25 MG (50000 UNIT) capsule 50,000 Units  50,000 Units Oral Q Stevan Darci Pore, MD         Discharge Medications: Please see discharge summary for a list of discharge medications.  Relevant Imaging Results:  Relevant Lab Results:   Additional Information SS#: [id number]  Lauraine JAYSON Carpen, LCSW

## 2023-11-17 NOTE — TOC Initial Note (Signed)
 Transition of Care [facility]) - Initial/Assessment Note    Patient Details  Name: [name] [name] MRN: [id number] Date of Birth: 09-06-1946  Transition of [facility]) CM/SW Contact:    Lauraine JAYSON Carpen, LCSW Phone Number: 11/17/2023, 4:22 PM  Clinical Narrative:   Per chart review, patient admitted from [facility] Admissions coordinator confirmed that patient is a long-term resident. Per MD, likely discharge tomorrow. CSW asked if patient would discharge on any new medications and MD said Augmentin  for 5 days. SNF confirmed they will have this medication tomorrow. No further concerns. CSW will continue to follow patient for support and facilitate return to SNF once medically stable.               Expected Discharge Plan: Skilled Nursing Facility Barriers to Discharge: Continued Medical Work up   Patient Goals and CMS Choice            Expected Discharge Plan and Services     Post Acute Care Choice: Resumption of Svcs/PTA Provider Living arrangements for the past 2 months: Skilled Nursing Facility                                      Prior Living Arrangements/Services Living arrangements for the past 2 months: Skilled Nursing Facility Lives with:: Facility Resident                   Activities of Daily Living      Permission Sought/Granted                  Emotional Assessment       Orientation: : Oriented to Self, Oriented to Place, Oriented to  Time, Oriented to Situation Alcohol  / Substance Use: Not Applicable Psych Involvement: No (comment)  Admission diagnosis:  Aspiration pneumonia (HCC) [J69.0] Acute respiratory failure with hypoxia and hypercapnia (HCC) [J96.01, J96.02] Aspiration into airway, initial encounter [T17.908A] Patient Active Problem List   Diagnosis Date Noted   Aspiration pneumonia (HCC) 11/15/2023   DNR (do not resuscitate) 11/15/2023   B12 deficiency 11/15/2023   Drug-induced encephalopathy 03/21/2023   AMS (altered  mental status) 03/18/2023   Elevated serum creatinine 03/18/2023   Acute encephalopathy 02/05/2023   Lethargy 02/04/2023   Cyanosis 02/04/2023   Speech abnormality 02/04/2023   Bradycardia 02/04/2023   Esophageal thickening 02/04/2023   Liver nodule 02/04/2023   Cerebral infarct (HCC) 02/04/2023   Dehydration 02/04/2023   TIA (transient ischemic attack) 07/05/2022   Abnormal MRI of head 07/05/2022   Uncontrolled diabetes mellitus with hyperglycemia, without long-term current use of insulin  (HCC)    Morbid obesity (HCC)    Cervical myelopathy with history of cervical discectomy and fusion (HCC)    Esophageal dysphagia 11/18/2020   Elevated BUN 11/18/2020   AKI (acute kidney injury) (HCC) 11/18/2020   HCAP (healthcare-associated pneumonia)    Mucus plugging of bronchi    Left cervical radiculopathy 10/26/2020   Acute cervical radiculopathy 10/26/2020   Pressure injury of skin 10/26/2020   Problems with swallowing and mastication    Stricture and stenosis of esophagus    Hx of colonic polyps    Polyp of ascending colon    Bilateral carpal tunnel syndrome 02/13/2020   Neurocardiogenic syncope 03/02/2019   Lymphedema 02/04/2019   Tobacco use 02/03/2019   CHF (congestive heart failure) (HCC) 12/23/2018   Chronic diastolic CHF (congestive heart failure) (HCC) 11/27/2018  Cellulitis 11/27/2018   Hypothyroidism 11/27/2018   OSA (obstructive sleep apnea) 11/27/2018   Hypertension 11/27/2018   Mixed hyperlipidemia 11/27/2018   Protein-calorie malnutrition (HCC) 03/02/2018   Neuropathy 03/02/2018   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   Difficulty in voiding 03/01/2018   Vitamin D  deficiency 03/01/2018   Vitamin B12 deficiency 03/01/2018   Insomnia 03/01/2018   COPD (chronic obstructive pulmonary disease) (HCC) 02/11/2018   Pain due to onychomycosis of nail 02/11/2018   Onychomycosis 02/11/2018   Traumatic ecchymosis of foot, right, initial encounter 02/11/2018   Xerostomia  02/11/2018   Frequent falls 02/11/2018   Acute respiratory failure with hypoxia (HCC) 02/07/2018   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   History of thyroid  cancer 05/05/2014   PCP:  Andi Jointer, DO Pharmacy:   [facility] - [place], [place] - [address] [place] [address] Phone: [phone] Fax: [phone]     Social Drivers of Health (SDOH) Social History: SDOH Screenings   Food Insecurity: No Food Insecurity (11/17/2023)  Housing: Unknown (11/17/2023)  Transportation Needs: No Transportation Needs (11/17/2023)  Utilities: Not At Risk (11/17/2023)  Financial Resource Strain: Low Risk  (12/07/2018)   Received from [facility], [facility]  Physical Activity: Inactive (12/07/2018)   Received from [facility], [facility]  Social Connections: Socially Isolated (11/17/2023)  Stress: No Stress Concern Present (12/07/2018)   Received from [facility], [facility]  Tobacco Use: Medium Risk (11/15/2023)   SDOH Interventions:     Readmission Risk Interventions     No data to display

## 2023-11-17 NOTE — Progress Notes (Signed)
 Speech Language Pathology Treatment: Dysphagia  Patient Details Name: [name] [name] MRN: [id number] DOB: August 26, 1946 Today's Date: 11/17/2023 Time: 8859-8847 SLP Time Calculation (min) (ACUTE ONLY): 12 min  Assessment / Plan / Recommendation Clinical Impression  Skilled observation was provided of pt consuming graham and saltine crackers with thin liquids via straw. Pt was sitting upright in bed. Pt consumed with any overt s/s of aspiration or choking. He did display slightly increased rate of consumption and therefore would continue to recommend nursing supervision during consumption. At this time, pt no longer requires skilled ST services.    HPI HPI: [name] [name] is a 77 year old male with history of hypothyroid, hyperlipidemia, depression, anxiety, GERD, neuropathy, insulin -dependent diabetes mellitus, who presents to the emergency department for chief concerns of shortness of breath from [facility]. Per [facility] staff, patient was seen aspirating on a hot dog last evening, 11/14/2023.  Patient was reported to have declined since then. CT Head 11/15/23: No acute intracranial process. CT Chest 11/15/23: 1. There are multiple patchy areas of atelectasis and scarring throughout bilateral lungs. However, no lung mass, consolidation, pleural effusion or pneumothorax. No acute inflammatory process identified within the abdomen or pelvis.      SLP Plan  All goals met      Recommendations for follow up therapy are one component of a multi-disciplinary discharge planning process, led by the attending physician.  Recommendations may be updated based on patient status, additional functional criteria and insurance authorization.    Recommendations  Diet recommendations: Regular;Thin liquid Liquids provided via: Straw Medication Administration: Whole meds with liquid Supervision: Patient able to self feed Compensations: Minimize environmental distractions;Slow rate;Small  sips/bites Postural Changes and/or Swallow Maneuvers: Seated upright 90 degrees;Out of bed for meals;Upright 30-60 min after meal                  Oral care BID     Dysphagia, unspecified (R13.10)     All goals met    [name] [name], M.S., CCC-SLP, Tree Surgeon Certified Brain Injury Specialist [facility]  [facility] Office [phone] Ascom [phone] Fax [phone]

## 2023-11-17 NOTE — Progress Notes (Signed)
 Progress Note   Patient: [name] [name] FMW:[id number] DOB: 1946-07-07 DOA: 11/15/2023     2 DOS: the patient was seen and examined on 11/17/2023   Brief hospital course: Mr. Carman Essick is a 77 year old male with history of hypothyroid, hyperlipidemia, depression, anxiety, GERD, neuropathy, insulin -dependent diabetes mellitus, who presents to the emergency department for chief concerns of shortness of breath from [facility]  Per [facility] staff, patient was seen aspirating on a hot dog last evening, 11/14/2023.  Patient was reported to have declined since then.  Vitals in the ED showed temperature of 99, respiration rate of 19, heart rate 76, blood pressure 134/65, SpO2 of 95% on 80% 15 L nasal cannula.  Serum sodium is 142, potassium 4.9, chloride 101, bicarb 29, BUN of 26, serum creatinine 1.67, EGFR 41, nonfasting blood glucose 110, WBC 18.5, hemoglobin 13.7, platelets of 213.  BNP is 91.6.  HS troponin is 13.  Lactic acid 0.8.  Procalcitonin 0.13.  UA was negative for leukocytes and nitrates.  VBG: 7.31/69/52  Blood cultures x 2 are in process.  ED treatment: Unasyn  3 g IV.  Assessment and Plan: * Aspiration pneumonia (HCC) Acute respiratory failure Continue Unasyn  per pharmacy, will change to Augmentin  tomorrow for discharge. Patient's oxygen is tapered off. Blood cultures x 2 pending Procalcitonin negative. Aspiration and fall precaution WBC trending down.  Uncontrolled diabetes mellitus with hyperglycemia, without long-term current use of insulin  (HCC) Insulin -dependent diabetes mellitus type 2 Hemoglobin A1c 6.3 Oral antiglycemic medications will not be resumed on admission Insulin  SSI with at bedtime coverage ordered Hypoglycemia protocol.  Hypothyroidism Continue levothyroxine  150 mcg daily.  Obesity (HCC) Bmi 37.5 Complicates overall care and prognosis. Diet, exercise and weight reduction advised.  B12 deficiency Per B12 level on 04/21/2023 at  176 B12 supplements oral ordered.  Metabolic encephalopathy CT head wo contrast ordered on admission  AKI (acute kidney injury) (HCC) Baseline creatinine 1, presented with 1.67 Kidney function improving with IV fluids. Encourage oral diet. Avoid nephrotoxic drugs. Recheck BMP in a.m.    Out of bed to chair. Incentive spirometry. Nursing supportive care. Fall, aspiration precautions. DVT prophylaxis   Code Status: Limited: Do not attempt resuscitation (DNR) -DNR-LIMITED -Do Not Intubate/DNI   Subjective: Patient is seen and examined today morning. He is sleeping comfortably.  Denies any complaints.  He is on room air.  No overnight issues.   Physical Exam: Vitals:   11/17/23 0745 11/17/23 0805 11/17/23 0941 11/17/23 1645  BP: (!) 145/86  (!) 113/57 110/63  Pulse: 62  73 63  Resp: 14   18  Temp:  (!) 97.4 F (36.3 C) 98.4 F (36.9 C) 98 F (36.7 C)  TempSrc:  Oral Oral Oral  SpO2: 96%  100% 94%  Weight:      Height:        General - Elderly obese Caucasian male, no apparent distress HEENT - PERRLA, EOMI, atraumatic head, non tender sinuses. Lung - distant breath sounds, basal rhonchi, no wheezes. Heart - S1, S2 heard, no murmurs, rubs, trace pedal edema. Abdomen - Soft, non tender, bowel sounds good Neuro - Alert, awake and oriented, non focal exam. Skin - Warm and dry.  Data Reviewed:      Latest Ref Rng & Units 11/17/2023    1:18 AM 11/16/2023    6:11 AM 11/15/2023   12:45 PM  CBC  WBC 4.0 - 10.5 K/uL 13.5  15.7  18.5   Hemoglobin 13.0 - 17.0 g/dL 12.1  12.8  13.7   Hematocrit 39.0 - 52.0 % 38.5  40.6  43.7   Platelets 150 - 400 K/uL 167  171  213       Latest Ref Rng & Units 11/17/2023    1:18 AM 11/16/2023    6:11 AM 11/15/2023   12:45 PM  BMP  Glucose 70 - 99 mg/dL 879  98  889   BUN 8 - 23 mg/dL 18  22  26    Creatinine 0.61 - 1.24 mg/dL 8.78  8.66  8.32   Sodium 135 - 145 mmol/L 141  138  142   Potassium 3.5 - 5.1 mmol/L 3.6  4.0  4.9    Chloride 98 - 111 mmol/L 103  101  101   CO2 22 - 32 mmol/L 27  27  29    Calcium  8.9 - 10.3 mg/dL 8.2  8.2  8.8    No results found.    Family Communication: Discussed with patient, he understand and agree. All questions answereed.  Disposition: Status is: Inpatient Remains inpatient appropriate because: IV antibiotics for aspiration pneumonia  Planned Discharge Destination: Skilled nursing facility     Time spent: 36 minutes  Author: Concepcion Riser, MD 11/17/2023 5:57 PM Secure chat 7am to 7pm For on call review [email or web address]

## 2023-11-18 DIAGNOSIS — J9601 Acute respiratory failure with hypoxia: Secondary | ICD-10-CM | POA: Diagnosis not present

## 2023-11-18 DIAGNOSIS — J690 Pneumonitis due to inhalation of food and vomit: Secondary | ICD-10-CM | POA: Diagnosis not present

## 2023-11-18 DIAGNOSIS — E1165 Type 2 diabetes mellitus with hyperglycemia: Secondary | ICD-10-CM | POA: Diagnosis not present

## 2023-11-18 DIAGNOSIS — N179 Acute kidney failure, unspecified: Secondary | ICD-10-CM | POA: Diagnosis not present

## 2023-11-18 LAB — GLUCOSE, CAPILLARY
Glucose-Capillary: 127 mg/dL — ABNORMAL HIGH (ref 70–99)
Glucose-Capillary: 118 mg/dL — ABNORMAL HIGH (ref 70–99)
Glucose-Capillary: 108 mg/dL — ABNORMAL HIGH (ref 70–99)
Glucose-Capillary: 97 mg/dL (ref 70–99)

## 2023-11-18 LAB — CBC
HCT: 35.1 % — ABNORMAL LOW (ref 39.0–52.0)
Hemoglobin: 11.2 g/dL — ABNORMAL LOW (ref 13.0–17.0)
MCH: 28.5 pg (ref 26.0–34.0)
MCHC: 31.9 g/dL (ref 30.0–36.0)
MCV: 89.3 fL (ref 80.0–100.0)
Platelets: 168 10*3/uL (ref 150–400)
RBC: 3.93 MIL/uL — ABNORMAL LOW (ref 4.22–5.81)
RDW: 16.8 % — ABNORMAL HIGH (ref 11.5–15.5)
WBC: 8.7 10*3/uL (ref 4.0–10.5)
nRBC: 0 % (ref 0.0–0.2)

## 2023-11-18 LAB — BASIC METABOLIC PANEL
Anion gap: 10 (ref 5–15)
BUN: 13 mg/dL (ref 8–23)
CO2: 30 mmol/L (ref 22–32)
Calcium: 8.5 mg/dL — ABNORMAL LOW (ref 8.9–10.3)
Chloride: 102 mmol/L (ref 98–111)
Creatinine, Ser: 1.12 mg/dL (ref 0.61–1.24)
GFR, Estimated: >60 mL/min (ref >60)
Glucose, Bld: 119 mg/dL — ABNORMAL HIGH (ref 70–99)
Potassium: 3.5 mmol/L (ref 3.5–5.1)
Sodium: 142 mmol/L (ref 135–145)

## 2023-11-18 NOTE — TOC Progression Note (Signed)
 Transition of Care [facility]) - Progression Note    Patient Details  Name: [name] [name] MRN: [id number] Date of Birth: 15-May-1946  [facility] CM/SW Contact  Corean ONEIDA Haddock, RN Phone Number: 11/18/2023, 12:58 PM  Clinical Narrative:      Per MD anticipated that patient will medically be ready for discharge tomorrow.  Adrien at [facility] notified    Expected Discharge Plan: Skilled Nursing Facility Barriers to Discharge: Continued Medical Work up  Expected Discharge Plan and Services     Post Acute Care Choice: Resumption of Svcs/PTA Provider Living arrangements for the past 2 months: Skilled Nursing Facility                                       Social Determinants of Health (SDOH) Interventions SDOH Screenings   Food Insecurity: No Food Insecurity (11/17/2023)  Housing: Unknown (11/17/2023)  Transportation Needs: No Transportation Needs (11/17/2023)  Utilities: Not At Risk (11/17/2023)  Financial Resource Strain: Low Risk  (12/07/2018)   Received from [facility], [facility]  Physical Activity: Inactive (12/07/2018)   Received from [facility], [facility]  Social Connections: Socially Isolated (11/17/2023)  Stress: No Stress Concern Present (12/07/2018)   Received from [facility], [facility]  Tobacco Use: Medium Risk (11/15/2023)    Readmission Risk Interventions     No data to display

## 2023-11-18 NOTE — Plan of Care (Signed)
  Problem: Fluid Volume: Goal: Hemodynamic stability will improve Outcome: Progressing   Problem: Activity: Goal: Risk for activity intolerance will decrease Outcome: Progressing   Problem: Nutrition: Goal: Adequate nutrition will be maintained Outcome: Progressing   

## 2023-11-18 NOTE — Progress Notes (Signed)
 Progress Note   Patient: [name] [name] FMW:[id number] DOB: June 15, 1946 DOA: 11/15/2023     3 DOS: the patient was seen and examined on 11/18/2023   Brief hospital course: [name] [name] is a 78 year old male with history of hypothyroid, hyperlipidemia, depression, anxiety, GERD, neuropathy, insulin -dependent diabetes mellitus, who presents to the emergency department for chief concerns of shortness of breath from [facility].  Per [facility] staff, patient was seen aspirating on a hot dog last evening, 11/14/2023.  Patient was reported to have declined since then.  Vitals in the ED showed temperature of 99, respiration rate of 19, heart rate 76, blood pressure 134/65, SpO2 of 95% on 80% 15 L nasal cannula.  Serum sodium is 142, potassium 4.9, chloride 101, bicarb 29, BUN of 26, serum creatinine 1.67, EGFR 41, nonfasting blood glucose 110, WBC 18.5, hemoglobin 13.7, platelets of 213.  BNP is 91.6.  HS troponin is 13.  Lactic acid 0.8.  Procalcitonin 0.13.  UA was negative for leukocytes and nitrates.  VBG: 7.31/69/52  Blood cultures x 2 are in process.  ED treatment: Unasyn  3 g IV.  Assessment and Plan: * Aspiration pneumonia (HCC) Acute respiratory failure NPO, SLP re-eval Continue Unasyn  per pharmacy, will change to Augmentin  tomorrow for discharge. Patient's oxygen is tapered off. Blood cultures x 2 pending Procalcitonin negative. Aspiration and fall precaution WBC normal  Uncontrolled diabetes mellitus with hyperglycemia, without long-term current use of insulin  (HCC) Insulin -dependent diabetes mellitus type 2 Hemoglobin A1c 6.3 Oral antiglycemic medications will not be resumed on admission Insulin  SSI with at bedtime coverage ordered Hypoglycemia protocol.  Hypothyroidism Continue levothyroxine  150 mcg daily.  Obesity (HCC) Bmi 37.5 Complicates overall care and prognosis. Diet, exercise and weight reduction advised.  B12 deficiency Per B12 level on 04/21/2023  at 176 B12 supplements oral ordered.  Metabolic encephalopathy CT head wo contrast ordered on admission  AKI (acute kidney injury) (HCC) Baseline creatinine 1, presented with 1.67 Kidney function improving with IV fluids. Encourage oral diet. Avoid nephrotoxic drugs. Recheck BMP in a.m.    Out of bed to chair. Incentive spirometry. Nursing supportive care. Fall, aspiration precautions. DVT prophylaxis   Code Status: Limited: Do not attempt resuscitation (DNR) -DNR-LIMITED -Do Not Intubate/DNI   Subjective: Patient is seen and examined today morning. He had  choking episode while eating soft diet, states he ate fast. Suctioning done. NPO, SLP reeval asked for. No other overnight issues.   Physical Exam: Vitals:   11/17/23 2115 11/18/23 0423 11/18/23 0814 11/18/23 1529  BP: 124/62 120/64 (!) 153/77 139/74  Pulse: 65 69 61 (!) 55  Resp: 18 18 18 18   Temp: 98.3 F (36.8 C) 98.1 F (36.7 C) 98.9 F (37.2 C) 99 F (37.2 C)  TempSrc:   Oral Oral  SpO2: 96% 98% 96% 97%  Weight:      Height:        General - Elderly obese Caucasian male, mild respiratory distress HEENT - PERRLA, EOMI, atraumatic head, non tender sinuses. Lung - distant breath sounds, basal rhonchi, no wheezes. Heart - S1, S2 heard, no murmurs, rubs, trace pedal edema. Abdomen - Soft, non tender, bowel sounds good Neuro - Alert, awake and oriented, non focal exam. Skin - Warm and dry.  Data Reviewed:      Latest Ref Rng & Units 11/18/2023    4:49 AM 11/17/2023    1:18 AM 11/16/2023    6:11 AM  CBC  WBC 4.0 - 10.5 K/uL 8.7  13.5  15.7   Hemoglobin 13.0 - 17.0 g/dL 88.7  87.8  87.1   Hematocrit 39.0 - 52.0 % 35.1  38.5  40.6   Platelets 150 - 400 K/uL 168  167  171       Latest Ref Rng & Units 11/18/2023    4:49 AM 11/17/2023    1:18 AM 11/16/2023    6:11 AM  BMP  Glucose 70 - 99 mg/dL 880  879  98   BUN 8 - 23 mg/dL 13  18  22    Creatinine 0.61 - 1.24 mg/dL 8.87  8.78  8.66   Sodium 135 - 145  mmol/L 142  141  138   Potassium 3.5 - 5.1 mmol/L 3.5  3.6  4.0   Chloride 98 - 111 mmol/L 102  103  101   CO2 22 - 32 mmol/L 30  27  27    Calcium  8.9 - 10.3 mg/dL 8.5  8.2  8.2    No results found.    Family Communication: Discussed with patient, he understand and agree. All questions answereed.  Disposition: Status is: Inpatient Remains inpatient appropriate because: IV antibiotics for aspiration pneumonia, SLP re-eval  Planned Discharge Destination: Skilled nursing facility     Time spent: 39 minutes  Author: Concepcion Riser, MD 11/18/2023 5:36 PM Secure chat 7am to 7pm For on call review [email or web address]

## 2023-11-18 NOTE — Plan of Care (Signed)

## 2023-11-18 NOTE — Care Management Important Message (Signed)
 Important Message  Patient Details  Name: [name] [name] MRN: [id number] Date of Birth: 04/20/1946   Important Message Given:  Yes - Medicare IM     Cristela Blue, CMA 11/18/2023, 10:44 AM

## 2023-11-19 DIAGNOSIS — R1319 Other dysphagia: Secondary | ICD-10-CM

## 2023-11-19 DIAGNOSIS — J690 Pneumonitis due to inhalation of food and vomit: Secondary | ICD-10-CM | POA: Diagnosis not present

## 2023-11-19 DIAGNOSIS — R1310 Dysphagia, unspecified: Secondary | ICD-10-CM

## 2023-11-19 DIAGNOSIS — G4733 Obstructive sleep apnea (adult) (pediatric): Secondary | ICD-10-CM

## 2023-11-19 DIAGNOSIS — Z720 Tobacco use: Secondary | ICD-10-CM

## 2023-11-19 DIAGNOSIS — G459 Transient cerebral ischemic attack, unspecified: Secondary | ICD-10-CM | POA: Diagnosis not present

## 2023-11-19 DIAGNOSIS — J9601 Acute respiratory failure with hypoxia: Secondary | ICD-10-CM | POA: Diagnosis not present

## 2023-11-19 LAB — GLUCOSE, CAPILLARY
Glucose-Capillary: 93 mg/dL (ref 70–99)
Glucose-Capillary: 157 mg/dL — ABNORMAL HIGH (ref 70–99)

## 2023-11-19 MED ORDER — RISPERIDONE 0.5 MG PO TABS: 0.5000 mg | ORAL_TABLET | Freq: Every day | ORAL | 0 refills | Status: DC

## 2023-11-19 MED ORDER — AMOXICILLIN-POT CLAVULANATE 875-125 MG PO TABS: 1.0000 | ORAL_TABLET | Freq: Two times a day (BID) | ORAL | 0 refills | Status: AC

## 2023-11-19 MED ORDER — LORAZEPAM INTENSOL 2 MG/ML PO CONC: 2.0000 mg | Freq: Three times a day (TID) | ORAL | 0 refills | Status: DC | PRN

## 2023-11-19 NOTE — Discharge Summary (Signed)
 Physician Discharge Summary   Patient: [name] [name] MRN: [id number] DOB: [birth year]  Admit date:     11/15/2023  Discharge date: 11/19/23  Discharge Physician: Concepcion Riser   PCP: Andi Jointer, DO   Recommendations at discharge:    PCP follow up in 1 week. Soft diet recommended as he is high risk for aspiration. Continue to palliative care discussions as he is non compliant with diet.  Discharge Diagnoses: Principal Problem:   Aspiration pneumonia (HCC) Active Problems:   TIA (transient ischemic attack)   Uncontrolled diabetes mellitus with hyperglycemia, without long-term current use of insulin  (HCC)   OSA (obstructive sleep apnea)   Hypertension   Mixed hyperlipidemia   Esophageal dysphagia   Hypothyroidism   Morbid obesity (HCC)   Acute respiratory failure with hypoxia (HCC)   Major depressive disorder   Insomnia   Protein-calorie malnutrition (HCC)   Tobacco use   Problems with swallowing and mastication   AKI (acute kidney injury) (HCC)   AMS (altered mental status)   DNR (do not resuscitate)   B12 deficiency  Resolved Problems:   * No resolved hospital problems. [facility] Course: [name] is a 78 year old male with history of hypothyroid, hyperlipidemia, depression, anxiety, GERD, neuropathy, insulin -dependent diabetes mellitus, who presents to the emergency department for chief concerns of shortness of breath from [facility].  Per [facility] staff, patient was seen aspirating on a hot dog last evening, 11/14/2023.  Patient was reported to have declined since then.  Vitals in the ED showed temperature of 99, respiration rate of 19, heart rate 76, blood pressure 134/65, SpO2 of 95% on 80% 15 L nasal cannula.  Serum sodium is 142, potassium 4.9, chloride 101, bicarb 29, BUN of 26, serum creatinine 1.67, EGFR 41, nonfasting blood glucose 110, WBC 18.5, hemoglobin 13.7, platelets of 213.  BNP is 91.6.  HS troponin is 13.  Lactic  acid 0.8.  Procalcitonin 0.13.  UA was negative for leukocytes and nitrates.  VBG: 7.31/69/52  Blood cultures x 2 are in process.  ED treatment: Unasyn  3 g IV.  Assessment and Plan: * Aspiration pneumonia (HCC) Acute respiratory failure IV Unasyn  changed to Augmentin  for 5 days at discharge. SLP advised dysphagia 3 diet. Patient's oxygen is tapered off. He is on room air. WBC normal Blood cultures x 2 negative  Oropharyngeal Dysphagia Patient requests regular diet despite SLP recommending soft diet. He is high risk for aspiration. Saw him choking with regular diet, attributes to eating fast.  I advised him to consider palliative care if he is adamant about diet. Continue GI follow up for esophageal dilation.   Uncontrolled diabetes mellitus with hyperglycemia, without long-term current use of insulin  (HCC) Hemoglobin A1c 6.3 Oral antiglycemic medications will be resumed.   Hypothyroidism Continue levothyroxine  150 mcg daily.   Obesity (HCC) Bmi 37.5 Complicates overall care and prognosis. Diet, exercise and weight reduction advised.   B12 deficiency Per B12 level on 04/21/2023 at 176 Continue B12 supplements.   Metabolic encephalopathy CT head wo contrast ordered on admission. Likely due to hypoxia. He is at baseline mental status.   AKI (acute kidney injury) (HCC) Improved with IV fluids. Avoid nephrotoxic drugs.       Consultants: none Procedures performed: none  Disposition: Skilled nursing facility Diet recommendation:  Discharge Diet Orders (From admission, onward)     Start     Ordered   11/19/23 0000  Diet - low sodium heart healthy  Comments: Soft cardiac, carb consistent diet   11/19/23 1224           Dysphagia type 3 thin Liquid DISCHARGE MEDICATION: Allergies as of 11/19/2023   No Known Allergies      Medication List     STOP taking these medications    sulfamethoxazole-trimethoprim 800-160 MG tablet Commonly known as:  BACTRIM DS       TAKE these medications    Acidophilus Probiotic Blend Caps Take 1 capsule by mouth 2 (two) times daily.   albuterol  108 (90 Base) MCG/ACT inhaler Commonly known as: VENTOLIN  HFA Inhale 2 puffs into the lungs every 6 (six) hours as needed for wheezing or shortness of breath.   allopurinol  300 MG tablet Commonly known as: ZYLOPRIM  Take 300 mg by mouth daily.   ammonium lactate 12 % lotion Commonly known as: LAC-HYDRIN Apply 1 Application topically as needed for dry skin.   amoxicillin -clavulanate 875-125 MG tablet Commonly known as: AUGMENTIN  Take 1 tablet by mouth 2 (two) times daily for 5 days.   Benefiber On The GO Powd Dissolve one packet in mouth daily.   bisacodyl  10 MG suppository Commonly known as: DULCOLAX Place 10 mg rectally daily as needed for moderate constipation.   cetirizine 10 MG tablet Commonly known as: ZYRTEC Take 10 mg by mouth daily.   Chloraseptic Max Sore Throat 15-10 MG Lozg Generic drug: Benzocaine -Menthol  Take 1 lozenge by mouth every 4 (four) hours as needed (sore throat).   clopidogrel  75 MG tablet Commonly known as: PLAVIX  Take 1 tablet (75 mg total) by mouth daily.   cyclobenzaprine  10 MG tablet Commonly known as: FLEXERIL  Take 10 mg by mouth every 8 (eight) hours as needed for muscle spasms.   ergocalciferol  1.25 MG (50000 UT) capsule Commonly known as: VITAMIN D2 Take 50,000 Units by mouth every Wednesday. Take every Monday and Wednesday   fexofenadine 60 MG tablet Commonly known as: ALLEGRA Take 60 mg by mouth daily.   FLUoxetine  40 MG capsule Commonly known as: PROZAC  Take 40 mg by mouth daily.   fluticasone  50 MCG/ACT nasal spray Commonly known as: FLONASE  Place 2 sprays into both nostrils daily.   furosemide  40 MG tablet Commonly known as: LASIX  Take 1 tablet (40 mg total) by mouth daily. What changed: when to take this   GenTeal Tears 0.1-0.2-0.3 % Soln Place 1 drop into both eyes in the  morning, at noon, in the evening, and at bedtime.   hydrocortisone  2.5 % cream Apply 1 application topically every 12 (twelve) hours as needed (per rectum, hemorrhoids).   insulin  glargine-yfgn 100 UNIT/ML Pen Commonly known as: SEMGLEE  Inject 36 Units into the skin at bedtime.   insulin  lispro 100 UNIT/ML injection Commonly known as: HUMALOG Inject 10 Units into the skin 3 (three) times daily before meals.   insulin  lispro 100 UNIT/ML injection Commonly known as: HUMALOG Inject per sliding scale 4 times daily. 100-149; 2 units, 150-199; 3 units, 200-249; 4 units, 250-299; 5 units, 300-349; 6 units, 350-399; 7 units, 400-449; 8 units, 450-499; 9 units, 500 and greater, call MD.   levothyroxine  150 MCG tablet Commonly known as: SYNTHROID  Take 150 mcg by mouth at bedtime.   Linzess  290 MCG Caps capsule Generic drug: linaclotide  Take 290 mcg by mouth daily.   LORazepam  Intensol 2 MG/ML concentrated solution Generic drug: LORazepam  Take 2 mg by mouth every 8 (eight) hours as needed for anxiety.   magnesium  oxide 400 MG tablet Commonly known as: MAG-OX Take 400 mg by  mouth 2 (two) times daily.   melatonin 5 MG Tabs Take 5 mg by mouth at bedtime.   Olopatadine HCl 0.7 % Soln Place 1 drop into both eyes daily.   pantoprazole  40 MG tablet Commonly known as: PROTONIX  Take 1 tablet (40 mg total) by mouth daily. What changed: when to take this   polyethylene glycol 17 g packet Commonly known as: MIRALAX  / GLYCOLAX  Take 17 g by mouth daily. Mix one tablespoon with 8oz of your favorite juice or water  every day until you are having soft formed stools. Then start taking once daily if you didn't have a stool the day before.   potassium chloride  SA 20 MEQ tablet Commonly known as: KLOR-CON  M Take 1 tablet (20 mEq total) by mouth daily. What changed: when to take this   prazosin 1 MG capsule Commonly known as: MINIPRESS Take 1 mg by mouth at bedtime.   pregabalin  300 MG  capsule Commonly known as: LYRICA  Take 300 mg by mouth 2 (two) times daily.   risperiDONE  0.5 MG tablet Commonly known as: RISPERDAL  Take 1 tablet (0.5 mg total) by mouth at bedtime.   rosuvastatin  10 MG tablet Commonly known as: CRESTOR  Take 10 mg by mouth at bedtime.   senna-docusate 8.6-50 MG tablet Commonly known as: Senokot-S Take 1 tablet by mouth 2 (two) times daily.   simethicone  125 MG chewable tablet Commonly known as: MYLICON Chew 125 mg by mouth 2 (two) times daily.   SitaGLIPtin -MetFORMIN  HCl [phone] MG Tb24 Take 1 tablet by mouth daily.   Wixela Inhub 250-50 MCG/ACT Aepb Generic drug: fluticasone -salmeterol Inhale 1 puff into the lungs in the morning and at bedtime.        Discharge Exam: Filed Weights   11/15/23 1407  Weight: 136.1 kg   General - Elderly obese Caucasian male, no distress HEENT - PERRLA, EOMI, atraumatic head, non tender sinuses. Lung - distant breath sounds, basal rhonchi, rales. Heart - S1, S2 heard, no murmurs, rubs, trace pedal edema. Abdomen - Soft, non tender, obese, bowel sounds good Neuro - Alert, awake and oriented, non focal exam. Skin - Warm and dry.  Condition at discharge: stable  The results of significant diagnostics from this hospitalization (including imaging, microbiology, ancillary and laboratory) are listed below for reference.   Imaging Studies: CT HEAD WO CONTRAST ( ) Result Date: 11/15/2023 CLINICAL DATA:  Neuro deficit, acute, stroke suspected Mental status change, unknown cause EXAM: CT HEAD WITHOUT CONTRAST TECHNIQUE: Contiguous axial images were obtained from the base of the skull through the vertex without intravenous contrast. RADIATION DOSE REDUCTION: This exam was performed according to the departmental dose-optimization program which includes automated exposure control, adjustment of the mA and/or kV according to patient size and/or use of iterative reconstruction technique. COMPARISON:  06/03/2023  FINDINGS: Brain: No acute infarct or hemorrhage. Lateral ventricles and midline structures are unremarkable. No acute extra-axial fluid collections. No mass effect. Vascular: No hyperdense vessel or unexpected calcification. Skull: Normal. Negative for fracture or focal lesion. Sinuses/Orbits: Mild mucoperiosteal thickening within the ethmoid, sphenoid, and maxillary sinuses. Other: None. IMPRESSION: 1. No acute intracranial process. Electronically Signed   By: Ozell Daring M.D.   On: 11/15/2023 17:22   CT CHEST ABDOMEN PELVIS W CONTRAST Result Date: 11/15/2023 CLINICAL DATA:  Sepsis.  Shortness of breath.  Unresponsive. EXAM: CT CHEST, ABDOMEN, AND PELVIS WITH CONTRAST TECHNIQUE: Multidetector CT imaging of the chest, abdomen and pelvis was performed following the standard protocol during bolus administration of intravenous contrast. RADIATION DOSE  REDUCTION: This exam was performed according to the departmental dose-optimization program which includes automated exposure control, adjustment of the mA and/or kV according to patient size and/or use of iterative reconstruction technique. CONTRAST:  80mL OMNIPAQUE  IOHEXOL  300 MG/ML  SOLN COMPARISON:  CT scan chest, abdomen and pelvis from 02/04/2023. FINDINGS: CT CHEST FINDINGS Cardiovascular: Normal cardiac size. No pericardial effusion. No aortic aneurysm. There are coronary artery calcifications, in keeping with coronary artery disease. There are also moderate peripheral atherosclerotic vascular calcifications of thoracic aorta and its major branches. Mediastinum/Nodes: Visualized thyroid  gland appears grossly unremarkable. No solid / cystic mediastinal masses. The esophagus is nondistended precluding optimal assessment. No axillary, mediastinal or hilar lymphadenopathy by size criteria. Lungs/Pleura: The central tracheo-bronchial tree is patent. There are patchy areas of linear, plate-like atelectasis and/or scarring throughout bilateral lungs. There also  superimposed dependent changes in bilateral lungs. No mass or consolidation. No pleural effusion or pneumothorax. No suspicious lung nodules. Musculoskeletal: The visualized soft tissues of the chest wall are grossly unremarkable. No suspicious osseous lesions. There are mild to moderate multilevel degenerative changes in the visualized spine. There is ankylosis of the thoracic spine. CT ABDOMEN PELVIS FINDINGS Hepatobiliary: The liver is normal in size. Non-cirrhotic configuration. No suspicious mass. Note is made of 5 mm subcapsular hypoattenuating focus in the left hepatic lobe, segment 2, which is too small to adequately characterize. No intrahepatic or extrahepatic bile duct dilation. No calcified gallstones. Normal gallbladder wall thickness. No pericholecystic inflammatory changes. Pancreas: Unremarkable. No pancreatic ductal dilatation or surrounding inflammatory changes. Spleen: Within normal limits. No focal lesion. Adrenals/Urinary Tract: Adrenal glands are unremarkable. No suspicious renal mass. Redemonstration of multiple bilateral renal cysts with largest exophytically arising from the right kidney lower pole measuring up to 4.3 x 4.9 cm. Urinary bladder is under distended, precluding optimal assessment. However, no large mass or stones identified. No perivesical fat stranding. Stomach/Bowel: No disproportionate dilation of the small or large bowel loops. No evidence of abnormal bowel wall thickening or inflammatory changes. The appendix is unremarkable. There are multiple diverticula throughout the colon, without imaging signs of diverticulitis. Vascular/Lymphatic: No ascites or pneumoperitoneum. No abdominal or pelvic lymphadenopathy, by size criteria. No aneurysmal dilation of the major abdominal arteries. There are moderate peripheral atherosclerotic vascular calcifications of the aorta and its major branches. Reproductive: Surgically absent prostate gland. Other: The visualized soft tissues and  abdominal wall are unremarkable. Musculoskeletal: No suspicious osseous lesions. There are moderate multilevel degenerative changes in the visualized spine. IMPRESSION: 1. There are multiple patchy areas of atelectasis and scarring throughout bilateral lungs. However, no lung mass, consolidation, pleural effusion or pneumothorax. 2. No acute inflammatory process identified within the abdomen or pelvis. 3. Multiple other nonacute observations, as described above. Electronically Signed   By: Ree Molt M.D.   On: 11/15/2023 14:37   DG Chest Portable 1 View Result Date: 11/15/2023 CLINICAL DATA:  Shortness of breath.  Hypoxia.  Recent aspiration. EXAM: PORTABLE CHEST 1 VIEW COMPARISON:  03/18/2023 FINDINGS: Stable cardiomegaly and pulmonary vascular congestion. No evidence of pulmonary infiltrate or edema. IMPRESSION: Stable cardiomegaly and pulmonary vascular congestion. No acute findings. Electronically Signed   By: Norleen DELENA Kil M.D.   On: 11/15/2023 13:07    Microbiology: Results for orders placed or performed during the hospital encounter of 11/15/23  Culture, blood (routine x 2)     Status: None (Preliminary result)   Collection Time: 11/15/23 12:44 PM   Specimen: BLOOD  Result Value Ref Range Status   Specimen Description  BLOOD RIGHT ANTECUBITAL  Final   Special Requests   Final    BOTTLES DRAWN AEROBIC AND ANAEROBIC Blood Culture adequate volume   Culture   Final    NO GROWTH 4 DAYS Performed at [facility], [address] [place], [place] [address]    Report Status PENDING  Incomplete  Culture, blood (routine x 2)     Status: None (Preliminary result)   Collection Time: 11/15/23  1:05 PM   Specimen: BLOOD  Result Value Ref Range Status   Specimen Description BLOOD BLOOD RIGHT ARM  Final   Special Requests   Final    BOTTLES DRAWN AEROBIC AND ANAEROBIC Blood Culture adequate volume   Culture   Final    NO GROWTH 4 DAYS Performed at [facility], [address] [place], [place] [address]    Report Status PENDING  Incomplete    Labs: CBC: Recent Labs  Lab 11/15/23 1245 11/16/23 0611 11/17/23 0118 11/18/23 0449  WBC 18.5* 15.7* 13.5* 8.7  NEUTROABS 16.4*  --   --   --   HGB 13.7 12.8* 12.1* 11.2*  HCT 43.7 40.6 38.5* 35.1*  MCV 91.8 91.4 91.7 89.3  PLT 213 171 167 168   Basic Metabolic Panel: Recent Labs  Lab 11/15/23 1245 11/16/23 0611 11/17/23 0118 11/18/23 0449  NA 142 138 141 142  K 4.9 4.0 3.6 3.5  CL 101 101 103 102  CO2 29 27 27 30   GLUCOSE 110* 98 120* 119*  BUN 26* 22 18 13   CREATININE 1.67* 1.33* 1.21 1.12  CALCIUM  8.8* 8.2* 8.2* 8.5*   Liver Function Tests: Recent Labs  Lab 11/15/23 1245  AST 17  ALT 14  ALKPHOS 121  BILITOT 0.8  PROT 7.3  ALBUMIN 3.2*   CBG: Recent Labs  Lab 11/18/23 0815 11/18/23 1151 11/18/23 1723 11/18/23 2124 11/19/23 1200  GLUCAP 127* 118* 108* 97 157*    Discharge time spent: 36 minutes.  Signed: Concepcion Riser, MD [facility] 11/19/2023

## 2023-11-19 NOTE — TOC Transition Note (Signed)
 Transition of Care [facility]) - Discharge Note   Patient Details  Name: [name] [name] MRN: [id number] Date of Birth: 08/26/1946  [facility] CM/SW Contact:  Corean ONEIDA Haddock, RN Phone Number: 11/19/2023, 2:11 PM   Clinical Narrative:     Patient will DC to: [facility] Anticipated DC date: 11/19/23  Family notified: nephew mr Engineer, Drilling by: ALBERTO  Per MD patient ready for DC to . RN, patient, patient's family, and facility notified of DC. Discharge Summary sent to facility. RN given number for report. DC packet on chart. Ambulance transport requested for patient. \\  MD and RN confirm signed DNR and script on chart TOC signing off.     Barriers to Discharge: Continued Medical Work up   Patient Goals and CMS Choice            Discharge Placement                       Discharge Plan and Services Additional resources added to the After Visit Summary for       Post Acute Care Choice: Resumption of Svcs/PTA Provider                               Social Drivers of Health (SDOH) Interventions SDOH Screenings   Food Insecurity: No Food Insecurity (11/17/2023)  Housing: Unknown (11/17/2023)  Transportation Needs: No Transportation Needs (11/17/2023)  Utilities: Not At Risk (11/17/2023)  Financial Resource Strain: Low Risk  (12/07/2018)   Received from [facility], [facility]  Physical Activity: Inactive (12/07/2018)   Received from [facility], [facility]  Social Connections: Socially Isolated (11/17/2023)  Stress: No Stress Concern Present (12/07/2018)   Received from [facility], [facility]  Tobacco Use: Medium Risk (11/15/2023)     Readmission Risk Interventions     No data to display

## 2023-11-19 NOTE — Evaluation (Signed)
 Clinical/Bedside Swallow Evaluation Patient Details  Name: KELVEN FLATER MRN: [id number] Date of Birth: 1946-07-19  Today's Date: 11/19/2023 Time: SLP Start Time (ACUTE ONLY): 0930 SLP Stop Time (ACUTE ONLY): 0950 SLP Time Calculation (min) (ACUTE ONLY): 20 min  Past Medical History:  Past Medical History:  Diagnosis Date   Anxiety disorder 05/05/2014   unspecifed   Arthritis    CHF (congestive heart failure) (HCC)    Colonic polyp    COPD (chronic obstructive pulmonary disease) (HCC)    Depression    Diabetes mellitus without complication (HCC)    Dysphagia    Dyspnea    Elevated prostate specific antigen (PSA)    Essential (primary) hypertension 05/05/2014   GERD (gastroesophageal reflux disease)    GI bleed    due to diverticulitis    Heart murmur    hx of 1960s   Hemorrhoids    History of thyroid  cancer 05/05/2014   Hypercholesteremia    Hyperlipidemia    Hypertension    Hypothyroidism 05/05/2014   Male erectile dysfunction 12/29/2012   Malignant neoplasm of prostate (HCC)    10/30/2011 T1c - Identified by needle biopsy . Gleason 7 (3+4) 5 of 12 cores, Bilateral    Pneumonia    Prediabetes 06/22/2017   Sleep apnea    CPAP , report on chart , not used in last 2 weeks    Urinary obstruction    not elsewhere classified   Past Surgical History:  Past Surgical History:  Procedure Laterality Date   BACK SURGERY     lumbar 1987    BACK SURGERY     C-6- Plate&Pin   BIOPSY  07/02/2023   Procedure: BIOPSY;  Surgeon: Onita Elspeth Sharper, DO;  Location: [facility] ENDOSCOPY;  Service: Gastroenterology;;   CATARACT EXTRACTION W/PHACO Right 09/15/2018   Procedure: CATARACT EXTRACTION PHACO AND INTRAOCULAR LENS PLACEMENT (IOC) RIGHT;  Surgeon: Mittie Gaskin, MD;  Location: [facility] SURGERY CNTR;  Service: Ophthalmology;  Laterality: Right;  Requests to be last case   CATARACT EXTRACTION W/PHACO Left 10/06/2018   Procedure: CATARACT EXTRACTION PHACO AND INTRAOCULAR LENS  PLACEMENT (IOC)  LEFT;  Surgeon: Mittie Gaskin, MD;  Location: [facility] SURGERY [facility];  Service: Ophthalmology;  Laterality: Left;   CERVICAL DISC SURGERY     COLONOSCOPY     10/06/2011, 06/05/2006, 05/06/2002 adematous polyps: CBF 09/2014; Recall Ltr mailed 02/27/2015 (dw)   COLONOSCOPY WITH PROPOFOL  N/A 07/06/2015   Procedure: COLONOSCOPY WITH PROPOFOL ;  Surgeon: Lamar ONEIDA Holmes, MD;  Location: [facility] ENDOSCOPY;  Service: Endoscopy;  Laterality: N/A;   COLONOSCOPY WITH PROPOFOL  N/A 09/01/2018   Procedure: COLONOSCOPY WITH PROPOFOL ;  Surgeon: Holmes Lamar ONEIDA, MD;  Location: [facility] ENDOSCOPY;  Service: Endoscopy;  Laterality: N/A;   COLONOSCOPY WITH PROPOFOL  N/A 04/17/2020   Procedure: COLONOSCOPY WITH PROPOFOL ;  Surgeon: Jinny Carmine, MD;  Location: [facility] ENDOSCOPY;  Service: Endoscopy;  Laterality: N/A;   ESOPHAGOGASTRODUODENOSCOPY (EGD) WITH PROPOFOL  N/A 04/17/2020   Procedure: ESOPHAGOGASTRODUODENOSCOPY (EGD) WITH PROPOFOL ;  Surgeon: Jinny Carmine, MD;  Location: [facility] ENDOSCOPY;  Service: Endoscopy;  Laterality: N/A;   ESOPHAGOGASTRODUODENOSCOPY (EGD) WITH PROPOFOL  N/A 07/02/2023   Procedure: ESOPHAGOGASTRODUODENOSCOPY (EGD) WITH PROPOFOL ;  Surgeon: Onita Elspeth Sharper, DO;  Location: [facility] ENDOSCOPY;  Service: Gastroenterology;  Laterality: N/A;   EYE SURGERY     HERNIA REPAIR     umbilical hernia repair    LAMINECTOMY FOR EXCISION / EVACUATION INTRASPINAL LESION     lumbar   OTHER SURGICAL HISTORY     right rotator cuff surgery  OTHER SURGICAL HISTORY     bilateral tubes in ears    POSTERIOR CERVICAL LAMINECTOMY N/A 10/27/2020   Procedure: POSTERIOR CERVICAL LAMINECTOMY CERVICAL THREE-CERVICAL SIX;  Surgeon: Cheryle Debby LABOR, MD;  Location: [facility] OR;  Service: Neurosurgery;  Laterality: N/A;   POSTERIOR LAMINECTOMY / DECOMPRESSION CERVICAL SPINE     PROSTATE BIOPSY     10/22/2011 Volume:55.8 cc's, PSA:4.5, Free PSA:12%   ROBOT ASSISTED LAPAROSCOPIC RADICAL PROSTATECTOMY  12/15/2011    Procedure: ROBOTIC ASSISTED LAPAROSCOPIC RADICAL PROSTATECTOMY LEVEL 2;  Surgeon: Noretta Ferrara, MD;  Location: [facility] ORS;  Service: Urology;  Laterality: N/A;       ROTATOR CUFF REPAIR Right    SHOULDER ARTHROSCOPY WITH SUBACROMIAL DECOMPRESSION, ROTATOR CUFF REPAIR AND BICEP TENDON REPAIR Left 11/07/2021   Procedure: SHOULDER ARTHROSCOPY WITH DEBRIDEMENT, DECOMPRESSION, ROTATOR CUFF REPAIR AND BICEPS TENODESIS.;  Surgeon: Edie Norleen PARAS, MD;  Location: [facility] ORS;  Service: Orthopedics;  Laterality: Left;   TONSILLECTOMY     TOTAL THYROIDECTOMY     HPI:  Mr. Schon Zeiders is a 78 year old male with history of hypothyroid, hyperlipidemia, depression, anxiety, GERD, neuropathy, insulin -dependent diabetes mellitus, who presents to the emergency department for chief concerns of shortness of breath from [facility]. Per [facility] staff, patient was seen aspirating on a hot dog last evening, 11/14/2023.  Patient was reported to have declined since then. CT Head 11/15/23: No acute intracranial process. CT Chest 11/15/23: 1. There are multiple patchy areas of atelectasis and scarring throughout bilateral lungs. However, no lung mass, consolidation, pleural effusion or pneumothorax. No acute inflammatory process identified within the abdomen or pelvis. Pt known to GI services, with EGD completed on 07/02/23, revealing, abnormal esophageal motility, suspicious for presbyesophagus. Dilated. Pt last seen for bedside swallow assessment on 11/16/23, with recommendation for unrestricted diet per pt request. Per progress note 11/18/23,  He had  choking episode while eating soft diet, states he ate fast. Suctioning done. NPO Pt seen for revaluation this date.    Assessment / Plan / Recommendation  Clinical Impression  Pt seen for bedside swallow assessment in the setting of recurrent choking episodes. Pt seen with trials of thin liquids (via straw), puree, and regular solids. No overt or subtle s/sx pharyngeal  dysphagia noted. No change to vocal quality across trials. Oral phase grossly intact- with complete manipulation and clearance of regular solid from oral cavity. Pt known impulsivity with PO intake, though not witnessed this date, likely given therapist presence and provision of initially verbal cues for slow rate. Assessment grossly the same as 11/16/23, with pt continuing to request regular diet despite therapist recommendation for soft solids, therefore defer diet recommendation to pt/MD decision for diet consistencies. Pt verbalizes understanding across education regarding aspiration precautions, however, recommend supervision with PO. Based on pt's adamancy to eat and risk of choking recommend further discussion regarding goals of care. MD aware of results and pt's desire for PO intake. SLP will continue to monitor. SLP Visit Diagnosis: Dysphagia, unspecified (R13.10)    Aspiration Risk  Moderate aspiration risk    Diet Recommendation    (defer to pt/MD)       Other  Recommendations      Recommendations for follow up therapy are one component of a multi-disciplinary discharge planning process, led by the attending physician.  Recommendations may be updated based on patient status, additional functional criteria and insurance authorization.  Follow up Recommendations No SLP follow up      Assistance Recommended at Discharge  Functional Status Assessment Patient has had a recent decline in their functional status and demonstrates the ability to make significant improvements in function in a reasonable and predictable amount of time.  Frequency and Duration min 1 x/week  1 week       Prognosis Prognosis for improved oropharyngeal function: Fair Barriers to Reach Goals: Behavior (impulsivity)      Swallow Study   General Date of Onset: 11/19/23 HPI: Mr. Humberto Addo is a 78 year old male with history of hypothyroid, hyperlipidemia, depression, anxiety, GERD, neuropathy,  insulin -dependent diabetes mellitus, who presents to the emergency department for chief concerns of shortness of breath from [facility]. Per [facility] staff, patient was seen aspirating on a hot dog last evening, 11/14/2023.  Patient was reported to have declined since then. CT Head 11/15/23: No acute intracranial process. CT Chest 11/15/23: 1. There are multiple patchy areas of atelectasis and scarring throughout bilateral lungs. However, no lung mass, consolidation, pleural effusion or pneumothorax. No acute inflammatory process identified within the abdomen or pelvis. Pt known to GI services, with EGD completed on 07/02/23, revealing, abnormal esophageal motility, suspicious for presbyesophagus. Dilated. Pt last seen for bedside swallow assessment on 11/16/23, with recommendation for unrestricted diet per pt request. Per progress note 11/18/23,  He had  choking episode while eating soft diet, states he ate fast. Suctioning done. NPO Pt seen for revaluation this date. Type of Study: Bedside Swallow Evaluation Previous Swallow Assessment: 11/16/23 Diet Prior to this Study: NPO Temperature Spikes Noted: No Respiratory Status: Room air (nasal canula set up, not placed on pt. RN made aware) History of Recent Intubation: No Behavior/Cognition: Alert;Cooperative;Pleasant mood Oral Cavity Assessment: Within Functional Limits Oral Care Completed by SLP: Recent completion by staff Oral Cavity - Dentition: Adequate natural dentition Vision: Functional for self-feeding Self-Feeding Abilities: Able to feed self Patient Positioning: Upright in bed Baseline Vocal Quality: Normal Volitional Cough: Congested    Oral/Motor/Sensory Function Overall Oral Motor/Sensory Function: Within functional limits   Ice Chips Ice chips: Not tested   Thin Liquid Thin Liquid: Within functional limits    Nectar Thick Nectar Thick Liquid: Not tested   Honey Thick Honey Thick Liquid: Not tested   Puree Puree:  Within functional limits   Solid     Solid: Within functional limits Other Comments: implusive tendencies     Bridgette Wolden Clapp  MS [facility] SLP   Maely Clements J Clapp 11/19/2023,10:09 AM

## 2023-11-19 NOTE — Progress Notes (Signed)
 Miquel JAYSON Pizza to be D/C'd Skilled nursing facility [facility] per MD order.  Discussed prescriptions and follow up appointments with Harlene, RN at [facility]. Prescriptions placed in packet.  Allergies as of 11/19/2023   No Known Allergies      Medication List     STOP taking these medications    sulfamethoxazole-trimethoprim 800-160 MG tablet Commonly known as: BACTRIM DS       TAKE these medications    Acidophilus Probiotic Blend Caps Take 1 capsule by mouth 2 (two) times daily.   albuterol  108 (90 Base) MCG/ACT inhaler Commonly known as: VENTOLIN  HFA Inhale 2 puffs into the lungs every 6 (six) hours as needed for wheezing or shortness of breath.   allopurinol  300 MG tablet Commonly known as: ZYLOPRIM  Take 300 mg by mouth daily.   ammonium lactate 12 % lotion Commonly known as: LAC-HYDRIN Apply 1 Application topically as needed for dry skin.   amoxicillin -clavulanate 875-125 MG tablet Commonly known as: AUGMENTIN  Take 1 tablet by mouth 2 (two) times daily for 5 days.   Benefiber On The GO Powd Dissolve one packet in mouth daily.   bisacodyl  10 MG suppository Commonly known as: DULCOLAX Place 10 mg rectally daily as needed for moderate constipation.   cetirizine 10 MG tablet Commonly known as: ZYRTEC Take 10 mg by mouth daily.   Chloraseptic Max Sore Throat 15-10 MG Lozg Generic drug: Benzocaine -Menthol  Take 1 lozenge by mouth every 4 (four) hours as needed (sore throat).   clopidogrel  75 MG tablet Commonly known as: PLAVIX  Take 1 tablet (75 mg total) by mouth daily.   cyclobenzaprine  10 MG tablet Commonly known as: FLEXERIL  Take 10 mg by mouth every 8 (eight) hours as needed for muscle spasms.   ergocalciferol  1.25 MG (50000 UT) capsule Commonly known as: VITAMIN D2 Take 50,000 Units by mouth every Wednesday. Take every Monday and Wednesday   fexofenadine 60 MG tablet Commonly known as: ALLEGRA Take 60 mg by mouth daily.   FLUoxetine  40 MG  capsule Commonly known as: PROZAC  Take 40 mg by mouth daily.   fluticasone  50 MCG/ACT nasal spray Commonly known as: FLONASE  Place 2 sprays into both nostrils daily.   furosemide  40 MG tablet Commonly known as: LASIX  Take 1 tablet (40 mg total) by mouth daily. What changed: when to take this   GenTeal Tears 0.1-0.2-0.3 % Soln Place 1 drop into both eyes in the morning, at noon, in the evening, and at bedtime.   hydrocortisone  2.5 % cream Apply 1 application topically every 12 (twelve) hours as needed (per rectum, hemorrhoids).   insulin  glargine-yfgn 100 UNIT/ML Pen Commonly known as: SEMGLEE  Inject 36 Units into the skin at bedtime.   insulin  lispro 100 UNIT/ML injection Commonly known as: HUMALOG Inject 10 Units into the skin 3 (three) times daily before meals.   insulin  lispro 100 UNIT/ML injection Commonly known as: HUMALOG Inject per sliding scale 4 times daily. 100-149; 2 units, 150-199; 3 units, 200-249; 4 units, 250-299; 5 units, 300-349; 6 units, 350-399; 7 units, 400-449; 8 units, 450-499; 9 units, 500 and greater, call MD.   levothyroxine  150 MCG tablet Commonly known as: SYNTHROID  Take 150 mcg by mouth at bedtime.   Linzess  290 MCG Caps capsule Generic drug: linaclotide  Take 290 mcg by mouth daily.   LORazepam  Intensol 2 MG/ML concentrated solution Generic drug: LORazepam  Take 1 mL (2 mg total) by mouth every 8 (eight) hours as needed for anxiety.   magnesium  oxide 400 MG tablet Commonly  known as: MAG-OX Take 400 mg by mouth 2 (two) times daily.   melatonin 5 MG Tabs Take 5 mg by mouth at bedtime.   Olopatadine HCl 0.7 % Soln Place 1 drop into both eyes daily.   pantoprazole  40 MG tablet Commonly known as: PROTONIX  Take 1 tablet (40 mg total) by mouth daily. What changed: when to take this   polyethylene glycol 17 g packet Commonly known as: MIRALAX  / GLYCOLAX  Take 17 g by mouth daily. Mix one tablespoon with 8oz of your favorite juice or water   every day until you are having soft formed stools. Then start taking once daily if you didn't have a stool the day before.   potassium chloride  SA 20 MEQ tablet Commonly known as: KLOR-CON  M Take 1 tablet (20 mEq total) by mouth daily. What changed: when to take this   prazosin 1 MG capsule Commonly known as: MINIPRESS Take 1 mg by mouth at bedtime.   pregabalin  300 MG capsule Commonly known as: LYRICA  Take 300 mg by mouth 2 (two) times daily.   risperiDONE  0.5 MG tablet Commonly known as: RISPERDAL  Take 1 tablet (0.5 mg total) by mouth at bedtime.   rosuvastatin  10 MG tablet Commonly known as: CRESTOR  Take 10 mg by mouth at bedtime.   senna-docusate 8.6-50 MG tablet Commonly known as: Senokot-S Take 1 tablet by mouth 2 (two) times daily.   simethicone  125 MG chewable tablet Commonly known as: MYLICON Chew 125 mg by mouth 2 (two) times daily.   SitaGLIPtin -MetFORMIN  HCl [phone] MG Tb24 Take 1 tablet by mouth daily.   Wixela Inhub 250-50 MCG/ACT Aepb Generic drug: fluticasone -salmeterol Inhale 1 puff into the lungs in the morning and at bedtime.        Vitals:   11/19/23 0530 11/19/23 0812  BP: (!) 149/72 (!) 160/77  Pulse: (!) 56 60  Resp: 18 19  Temp: 97.7 F (36.5 C) 97.8 F (36.6 C)  SpO2: 91% 91%    Skin clean, dry and intact without evidence of skin break down, no evidence of skin tears noted. IV catheter discontinued intact. Site without signs and symptoms of complications. Dressing and pressure applied. Pt denies pain at this time. No complaints noted.  An After Visit Summary was printed and placed in packet. EMS is transporting to facility.  Anikah Hogge C. Vanua

## 2023-11-20 LAB — CULTURE, BLOOD (ROUTINE X 2)
Culture: NO GROWTH
Culture: NO GROWTH
Special Requests: ADEQUATE
Special Requests: ADEQUATE

## 2024-02-16 DEATH — deceased
